# Patient Record
Sex: Male | Born: 1960 | Race: Black or African American | Hispanic: No | Marital: Married | Smoking: Never smoker
Health system: Southern US, Community
[De-identification: ages and names within clinical notes are randomized; demographics above are authoritative.]

## PROBLEM LIST (undated history)

## (undated) DIAGNOSIS — I2699 Other pulmonary embolism without acute cor pulmonale: Secondary | ICD-10-CM

## (undated) DIAGNOSIS — I10 Essential (primary) hypertension: Secondary | ICD-10-CM

## (undated) DIAGNOSIS — U071 COVID-19: Secondary | ICD-10-CM

## (undated) DIAGNOSIS — E119 Type 2 diabetes mellitus without complications: Secondary | ICD-10-CM

## (undated) DIAGNOSIS — I4891 Unspecified atrial fibrillation: Secondary | ICD-10-CM

## (undated) DIAGNOSIS — Z21 Asymptomatic human immunodeficiency virus [HIV] infection status: Secondary | ICD-10-CM

## (undated) DIAGNOSIS — J1282 Pneumonia due to coronavirus disease 2019: Secondary | ICD-10-CM

## (undated) DIAGNOSIS — K265 Chronic or unspecified duodenal ulcer with perforation: Secondary | ICD-10-CM

## (undated) HISTORY — DX: Unspecified atrial fibrillation: I48.91

## (undated) HISTORY — PX: OTHER SURGICAL HISTORY: SHX169

## (undated) HISTORY — DX: Other pulmonary embolism without acute cor pulmonale: I26.99

## (undated) HISTORY — DX: Pneumonia due to coronavirus disease 2019: J12.82

## (undated) HISTORY — DX: COVID-19: U07.1

## (undated) HISTORY — DX: Essential (primary) hypertension: I10

## (undated) HISTORY — DX: Chronic or unspecified duodenal ulcer with perforation: K26.5

---

## 2012-04-16 ENCOUNTER — Emergency Department (HOSPITAL_COMMUNITY): Payer: Medicare Other

## 2012-04-16 ENCOUNTER — Encounter (HOSPITAL_COMMUNITY): Payer: Self-pay | Admitting: *Deleted

## 2012-04-16 ENCOUNTER — Emergency Department (HOSPITAL_COMMUNITY)
Admission: EM | Admit: 2012-04-16 | Discharge: 2012-04-16 | Disposition: A | Discharge status: Home / Self Care | Payer: Medicare Other | Attending: Emergency Medicine | Admitting: Emergency Medicine

## 2012-04-16 DIAGNOSIS — J3489 Other specified disorders of nose and nasal sinuses: Secondary | ICD-10-CM | POA: Insufficient documentation

## 2012-04-16 DIAGNOSIS — R1110 Vomiting, unspecified: Secondary | ICD-10-CM | POA: Insufficient documentation

## 2012-04-16 DIAGNOSIS — R197 Diarrhea, unspecified: Secondary | ICD-10-CM | POA: Insufficient documentation

## 2012-04-16 DIAGNOSIS — Z79899 Other long term (current) drug therapy: Secondary | ICD-10-CM | POA: Insufficient documentation

## 2012-04-16 DIAGNOSIS — J209 Acute bronchitis, unspecified: Secondary | ICD-10-CM | POA: Insufficient documentation

## 2012-04-16 DIAGNOSIS — J029 Acute pharyngitis, unspecified: Secondary | ICD-10-CM | POA: Insufficient documentation

## 2012-04-16 DIAGNOSIS — R062 Wheezing: Secondary | ICD-10-CM | POA: Insufficient documentation

## 2012-04-16 DIAGNOSIS — IMO0001 Reserved for inherently not codable concepts without codable children: Secondary | ICD-10-CM | POA: Insufficient documentation

## 2012-04-16 DIAGNOSIS — Z21 Asymptomatic human immunodeficiency virus [HIV] infection status: Secondary | ICD-10-CM | POA: Insufficient documentation

## 2012-04-16 DIAGNOSIS — I10 Essential (primary) hypertension: Secondary | ICD-10-CM | POA: Insufficient documentation

## 2012-04-16 DIAGNOSIS — R0789 Other chest pain: Secondary | ICD-10-CM | POA: Insufficient documentation

## 2012-04-16 DIAGNOSIS — R509 Fever, unspecified: Secondary | ICD-10-CM | POA: Insufficient documentation

## 2012-04-16 HISTORY — DX: Essential (primary) hypertension: I10

## 2012-04-16 LAB — COMPREHENSIVE METABOLIC PANEL WITH GFR
ALT: 48 U/L (ref 0–53)
AST: 25 U/L (ref 0–37)
Albumin: 3.5 g/dL (ref 3.5–5.2)
Alkaline Phosphatase: 115 U/L (ref 39–117)
BUN: 11 mg/dL (ref 6–23)
CO2: 26 meq/L (ref 19–32)
Calcium: 9 mg/dL (ref 8.4–10.5)
Chloride: 105 meq/L (ref 96–112)
Creatinine, Ser: 0.84 mg/dL (ref 0.50–1.35)
GFR calc Af Amer: >90 mL/min
GFR calc non Af Amer: >90 mL/min
Glucose, Bld: 97 mg/dL (ref 70–99)
Potassium: 3.9 meq/L (ref 3.5–5.1)
Sodium: 140 meq/L (ref 135–145)
Total Bilirubin: 0.2 mg/dL — ABNORMAL LOW (ref 0.3–1.2)
Total Protein: 7 g/dL (ref 6.0–8.3)

## 2012-04-16 LAB — CBC WITH DIFFERENTIAL/PLATELET
Eosinophils Absolute: 0.2 10*3/uL (ref 0.0–0.7)
Eosinophils Relative: 5 % (ref 0–5)
HCT: 42.7 % (ref 39.0–52.0)
Hemoglobin: 14.7 g/dL (ref 13.0–17.0)
Lymphocytes Relative: 40 % (ref 12–46)
Lymphs Abs: 1.9 10*3/uL (ref 0.7–4.0)
MCH: 30.7 pg (ref 26.0–34.0)
MCV: 89.1 fL (ref 78.0–100.0)
Monocytes Absolute: 0.5 10*3/uL (ref 0.1–1.0)
Monocytes Relative: 12 % (ref 3–12)
RBC: 4.79 MIL/uL (ref 4.22–5.81)
WBC: 4.7 10*3/uL (ref 4.0–10.5)

## 2012-04-16 MED ORDER — ALBUTEROL SULFATE HFA 108 (90 BASE) MCG/ACT IN AERS: 1.0000 | INHALATION_SPRAY | Freq: Four times a day (QID) | RESPIRATORY_TRACT | Status: DC | PRN

## 2012-04-16 MED ORDER — PREDNISONE 20 MG PO TABS: ORAL_TABLET | ORAL | Status: DC

## 2012-04-16 MED ORDER — PREDNISONE 50 MG PO TABS
60.0000 mg | ORAL_TABLET | Freq: Once | ORAL | Status: AC
  Administered 2012-04-16: 60 mg via ORAL
  Filled 2012-04-16: qty 1

## 2012-04-16 MED ORDER — ALBUTEROL SULFATE (5 MG/ML) 0.5% IN NEBU
5.0000 mg | INHALATION_SOLUTION | Freq: Once | RESPIRATORY_TRACT | Status: AC
  Administered 2012-04-16: 5 mg via RESPIRATORY_TRACT
  Filled 2012-04-16: qty 1

## 2012-04-16 MED ORDER — AZITHROMYCIN 250 MG PO TABS: ORAL_TABLET | ORAL | Status: DC

## 2012-04-16 MED ORDER — IPRATROPIUM BROMIDE 0.02 % IN SOLN
0.5000 mg | Freq: Once | RESPIRATORY_TRACT | Status: AC
  Administered 2012-04-16: 0.5 mg via RESPIRATORY_TRACT
  Filled 2012-04-16: qty 2.5

## 2012-04-16 NOTE — ED Notes (Signed)
Pt with chills, cough productive but dry now, body aches and fever

## 2012-04-16 NOTE — ED Notes (Signed)
EDP in with patient at this time.  

## 2012-04-16 NOTE — ED Notes (Signed)
Called radiology to check on chest xray.  They to call me back.

## 2012-04-16 NOTE — ED Provider Notes (Signed)
History  This chart was scribed for Ward Givens, MD by Bennett Scrape, ED Scribe. This patient was seen in room APA06/[facility]06 and the patient's care was started at 1:37 PM.  CSN: [id number]  Arrival date & time 04/16/12  1322   First MD Initiated Contact with Patient 04/16/12 1337      Chief Complaint  Patient presents with  . Cough  . Chills     Patient is a 52 y.o. male presenting with cough. The history is provided by the patient. No language interpreter was used.  Cough Cough characteristics:  Productive Sputum characteristics:  Yellow Severity:  Moderate Onset quality:  Gradual Duration:  4 days Timing:  Constant Progression:  Worsening Chronicity:  New Smoker: no   Context: weather changes   Associated symptoms: chills, fever, myalgias, rhinorrhea, sore throat and wheezing   Associated symptoms: no chest pain     Jose Lamb is a 52 y.o. male who presents to the Emergency Department complaining of 4 days of gradual onset, gradually worsening, constant cough productive of yellow mucus with associated wheezing, fever, light yellow rhinorrhea, one episode of diarrhea, one episode of post-tussive emesis and sore throat. He states that he also feels chest tightness across the center with walking and dizziness with lightheadedness with coughing. Pt reports walking in the snow from [employer] location down to [place] area which is several miles" in the sleet and snow the day prior to the symptoms starting. He states that he has been using an inhaler which was prescribed to him for SOB with 30 minutes of improvement. He reports prior episodes of PNA with admissions, last pneumonia diagnosis was in 2000. He reports prior steroid use for similar complaints.   He is currently on medications for HIV (CD4 is 584 and viral load is 0 since 2005) and HTN. He denies smoking and alcohol use.  PCP is Dr. Osker Mason with [facility] with ID  Past Medical History  Diagnosis Date  .  Hypertension   HIV +  Bronchospasm   History reviewed. No pertinent past surgical history.  History reviewed. No pertinent family history.  History  Substance Use Topics  . Smoking status: Never Smoker   . Smokeless tobacco: Not on file  . Alcohol Use: No  on disability     Review of Systems  Constitutional: Positive for fever and chills.  HENT: Positive for sore throat and rhinorrhea.   Respiratory: Positive for cough, chest tightness and wheezing.   Cardiovascular: Negative for chest pain.  Gastrointestinal: Positive for vomiting (post-tussive) and diarrhea. Negative for nausea and abdominal pain.  Musculoskeletal: Positive for myalgias.  All other systems reviewed and are negative.    Allergies  Review of patient's allergies indicates no known allergies.  Home Medications   Current Outpatient Rx  Name  Route  Sig  Dispense  Refill  . albuterol (PROVENTIL HFA;VENTOLIN HFA) 108 (90 BASE) MCG/ACT inhaler   Inhalation   Inhale 2 puffs into the lungs every 6 (six) hours as needed for wheezing or shortness of breath.         . Darunavir Ethanolate (PREZISTA) 800 MG tablet   Oral   Take 800 mg by mouth daily.         [name] emtricitabine-tenofovir (TRUVADA) 200-300 MG per tablet   Oral   Take 1 tablet by mouth daily.         . Etravirine (INTELENCE) 200 MG TABS   Oral   Take 2 tablets by mouth daily.         [name]  gabapentin (NEURONTIN) 300 MG capsule   Oral   Take 300 mg by mouth 3 (three) times daily.         . Pseudoeph-Doxylamine-DM-APAP (NYQUIL) 60-7.07-05-998 MG/30ML LIQD   Oral   Take 30 mLs by mouth at bedtime as needed (cold symptoms).         . ritonavir (NORVIR) 100 MG capsule   Oral   Take 100 mg by mouth daily.         [name] triamterene-hydrochlorothiazide (MAXZIDE-25) 37.5-25 MG per tablet   Oral   Take 1 tablet by mouth daily.         . valACYclovir (VALTREX) 500 MG tablet   Oral   Take 500 mg by mouth 2 (two) times daily.             Triage Vitals: BP 122/86  Pulse 88  Temp(Src) 98.3 F (36.8 C) (Oral)  Resp 20  Ht 5\' 11"  (1.803 m)  Wt 226 lb (102.513 kg)  BMI 31.53 kg/m2  SpO2 95%  Vital signs normal    Physical Exam  Nursing note and vitals reviewed. Constitutional: He is oriented to person, place, and time. He appears well-developed and well-nourished.  Non-toxic appearance. He does not appear ill. No distress.  HENT:  Head: Normocephalic and atraumatic.  Right Ear: External ear normal.  Left Ear: External ear normal.  Nose: Nose normal. No mucosal edema or rhinorrhea.  Mouth/Throat: Oropharynx is clear and moist and mucous membranes are normal. No dental abscesses or edematous.  Eyes: Conjunctivae and EOM are normal. Pupils are equal, round, and reactive to light.  Neck: Normal range of motion and full passive range of motion without pain. Neck supple.  Cardiovascular: Normal rate, regular rhythm and normal heart sounds.  Exam reveals no gallop and no friction rub.   No murmur heard. Pulmonary/Chest: Accessory muscle usage present. No respiratory distress. He has wheezes (diffuse expiratory high pitched wheezing). He has no rhonchi. He has no rales. He exhibits no tenderness and no crepitus.  A lot of coughing noted on exam  Abdominal: Soft. Normal appearance and bowel sounds are normal. He exhibits no distension. There is no tenderness. There is no rebound and no guarding.  Musculoskeletal: Normal range of motion. He exhibits no edema and no tenderness.  Moves all extremities well.   Neurological: He is alert and oriented to person, place, and time. He has normal strength. No cranial nerve deficit.  Skin: Skin is warm, dry and intact. No rash noted. No erythema. No pallor.  Psychiatric: He has a normal mood and affect. His speech is normal and behavior is normal. His mood appears not anxious.    ED Course  Procedures (including critical care time)  Medications  albuterol (PROVENTIL) (5 MG/ML)  0.5% nebulizer solution 5 mg (5 mg Nebulization Given 04/16/12 1442)  ipratropium (ATROVENT) nebulizer solution 0.5 mg (0.5 mg Nebulization Given 04/16/12 1442)  predniSONE (DELTASONE) tablet 60 mg (60 mg Oral Given 04/16/12 1418)     DIAGNOSTIC STUDIES: Oxygen Saturation is 95% on room air, adequate by my interpretation.    COORDINATION OF CARE: 2:08 PM-Discussed treatment plan which includes CXR, CBC panel, and breathing treatment with pt at bedside and pt agreed to plan.   Recheck 16:10 lungs clear after nebulizer, no more coughing feeling better, waiting for CXR to be read.   Pt initially stated he didn't need another inhaler than changed his mind and stated he needed another one.   Results for orders placed during  the hospital encounter of 04/16/12  CBC WITH DIFFERENTIAL      Result Value Range   WBC 4.7  4.0 - 10.5 K/uL   RBC 4.79  4.22 - 5.81 MIL/uL   Hemoglobin 14.7  13.0 - 17.0 g/dL   HCT 40.9  81.1 - 91.4 %   MCV 89.1  78.0 - 100.0 fL   MCH 30.7  26.0 - 34.0 pg   MCHC 34.4  30.0 - 36.0 g/dL   RDW 78.2  95.6 - 21.3 %   Platelets 180  150 - 400 K/uL   Neutrophils Relative 43  43 - 77 %   Neutro Abs 2.0  1.7 - 7.7 K/uL   Lymphocytes Relative 40  12 - 46 %   Lymphs Abs 1.9  0.7 - 4.0 K/uL   Monocytes Relative 12  3 - 12 %   Monocytes Absolute 0.5  0.1 - 1.0 K/uL   Eosinophils Relative 5  0 - 5 %   Eosinophils Absolute 0.2  0.0 - 0.7 K/uL   Basophils Relative 1  0 - 1 %   Basophils Absolute 0.0  0.0 - 0.1 K/uL  COMPREHENSIVE METABOLIC PANEL      Result Value Range   Sodium 140  135 - 145 mEq/L   Potassium 3.9  3.5 - 5.1 mEq/L   Chloride 105  96 - 112 mEq/L   CO2 26  19 - 32 mEq/L   Glucose, Bld 97  70 - 99 mg/dL   BUN 11  6 - 23 mg/dL   Creatinine, Ser 0.86  0.50 - 1.35 mg/dL   Calcium 9.0  8.4 - 57.8 mg/dL   Total Protein 7.0  6.0 - 8.3 g/dL   Albumin 3.5  3.5 - 5.2 g/dL   AST 25  0 - 37 U/L   ALT 48  0 - 53 U/L   Alkaline Phosphatase 115  39 - 117 U/L   Total  Bilirubin 0.2 (*) 0.3 - 1.2 mg/dL   GFR calc non Af Amer >90  >90 mL/min   GFR calc Af Amer >90  >90 mL/min   Laboratory interpretation all normal    Dg Chest 2 View  04/16/2012  *RADIOLOGY REPORT*  Clinical Data: Cough and chills  CHEST - 2 VIEW  Comparison: None  Findings: Mild bronchitic changes are noted bilaterally.  No focal airspace consolidation or atelectasis.  No pleural effusion or edema.  Lung volumes are slightly decreased.  The heart size is normal.  IMPRESSION:  1.  No evidence for pneumonia. 2.  Bronchitic changes.   Original Report Authenticated By: Signa Kell, M.D.      1. Bronchitis, acute, with bronchospasm     New Prescriptions   ALBUTEROL (PROVENTIL HFA;VENTOLIN HFA) 108 (90 BASE) MCG/ACT INHALER    Inhale 1-2 puffs into the lungs every 6 (six) hours as needed for wheezing.   AZITHROMYCIN (ZITHROMAX Z-PAK) 250 MG TABLET    Take 2 po the first day then once a day for the next 4 days.   PREDNISONE (DELTASONE) 20 MG TABLET    Take 3 po QD x 2d starting tomorrow, then 2 po QD x 3d then 1 po QD x 3d    Plan discharge  Devoria Albe, MD, FACEP   MDM   I personally performed the services described in this documentation, which was scribed in my presence. The recorded information has been reviewed and considered.  Devoria Albe, MD, FACEP    Ward Givens, MD  04/16/12 1713 

## 2012-07-04 DIAGNOSIS — E66811 Obesity, class 1: Secondary | ICD-10-CM | POA: Insufficient documentation

## 2012-07-04 DIAGNOSIS — E669 Obesity, unspecified: Secondary | ICD-10-CM | POA: Insufficient documentation

## 2012-12-15 ENCOUNTER — Emergency Department (HOSPITAL_COMMUNITY): Payer: Medicare Other

## 2012-12-15 ENCOUNTER — Encounter (HOSPITAL_COMMUNITY): Payer: Self-pay | Admitting: Emergency Medicine

## 2012-12-15 ENCOUNTER — Emergency Department (HOSPITAL_COMMUNITY)
Admission: EM | Admit: 2012-12-15 | Discharge: 2012-12-15 | Disposition: A | Discharge status: Home / Self Care | Payer: Medicare Other | Attending: Emergency Medicine | Admitting: Emergency Medicine

## 2012-12-15 DIAGNOSIS — Z21 Asymptomatic human immunodeficiency virus [HIV] infection status: Secondary | ICD-10-CM | POA: Insufficient documentation

## 2012-12-15 DIAGNOSIS — Z79899 Other long term (current) drug therapy: Secondary | ICD-10-CM | POA: Insufficient documentation

## 2012-12-15 DIAGNOSIS — J45901 Unspecified asthma with (acute) exacerbation: Secondary | ICD-10-CM

## 2012-12-15 DIAGNOSIS — J40 Bronchitis, not specified as acute or chronic: Secondary | ICD-10-CM

## 2012-12-15 DIAGNOSIS — I10 Essential (primary) hypertension: Secondary | ICD-10-CM | POA: Insufficient documentation

## 2012-12-15 MED ORDER — ALBUTEROL SULFATE (5 MG/ML) 0.5% IN NEBU
5.0000 mg | INHALATION_SOLUTION | Freq: Once | RESPIRATORY_TRACT | Status: DC
  Filled 2012-12-15: qty 1

## 2012-12-15 MED ORDER — ALBUTEROL SULFATE (5 MG/ML) 0.5% IN NEBU
5.0000 mg | INHALATION_SOLUTION | Freq: Once | RESPIRATORY_TRACT | Status: AC
  Administered 2012-12-15: 5 mg via RESPIRATORY_TRACT
  Filled 2012-12-15: qty 1

## 2012-12-15 MED ORDER — AMOXICILLIN 500 MG PO CAPS: 500.0000 mg | ORAL_CAPSULE | Freq: Three times a day (TID) | ORAL | Status: DC

## 2012-12-15 MED ORDER — DM-GUAIFENESIN ER 30-600 MG PO TB12
1.0000 | ORAL_TABLET | Freq: Two times a day (BID) | ORAL | Status: DC
  Administered 2012-12-15: 1 via ORAL
  Filled 2012-12-15: qty 1

## 2012-12-15 MED ORDER — PREDNISONE 20 MG PO TABS: ORAL_TABLET | ORAL | Status: DC

## 2012-12-15 MED ORDER — IPRATROPIUM BROMIDE 0.02 % IN SOLN
0.5000 mg | Freq: Once | RESPIRATORY_TRACT | Status: DC
  Filled 2012-12-15: qty 2.5

## 2012-12-15 MED ORDER — ALBUTEROL (5 MG/ML) CONTINUOUS INHALATION SOLN
15.0000 mg/h | INHALATION_SOLUTION | Freq: Once | RESPIRATORY_TRACT | Status: AC
  Administered 2012-12-15: 15 mg/h via RESPIRATORY_TRACT
  Filled 2012-12-15: qty 20

## 2012-12-15 MED ORDER — IPRATROPIUM BROMIDE 0.02 % IN SOLN
0.5000 mg | Freq: Once | RESPIRATORY_TRACT | Status: AC
  Administered 2012-12-15: 0.5 mg via RESPIRATORY_TRACT
  Filled 2012-12-15: qty 2.5

## 2012-12-15 MED ORDER — PREDNISONE 50 MG PO TABS
60.0000 mg | ORAL_TABLET | Freq: Once | ORAL | Status: AC
  Administered 2012-12-15: 60 mg via ORAL
  Filled 2012-12-15 (×2): qty 1

## 2012-12-15 MED ORDER — DM-GUAIFENESIN ER 30-600 MG PO TB12: 1.0000 | ORAL_TABLET | Freq: Two times a day (BID) | ORAL | Status: DC

## 2012-12-15 NOTE — ED Notes (Signed)
Pt c/o cough with yellow mucous production, chest tightness with coughing, chills, voice hoarse at times, states that the symptoms started two days ago,

## 2012-12-15 NOTE — ED Provider Notes (Signed)
CSN: [id number]     Arrival date & time 12/15/12  1632 History   First MD Initiated Contact with Patient 12/15/12 1732     Chief Complaint  Patient presents with  . Cough   (Consider location/radiation/quality/duration/timing/severity/associated sxs/prior Treatment) HPI Patient reports for the past 3 days he's had a cough with wheezing. He states he feels short of breath and he has posttussive vomiting. He also states he's coughing up yellow sputum. He is unaware of any fever but states sometimes he has a sore throat and he has rhinorrhea that is also yellow. He has been sneezing. He states today he used his albuterol inhaler and it did not help. He states he has a history of asthma and has only 4 prior asthmatic episodes in the past.  Patient is followed at [facility] for his HIV disease. He reports his viral load has been 0 for the past 5 years.  ID physician [facility]  Past Medical History  Diagnosis Date  . Hypertension   Asthma HIV +   History reviewed. No pertinent past surgical history. No family history on file. History  Substance Use Topics  . Smoking status: Never Smoker   . Smokeless tobacco: Not on file  . Alcohol Use: No  lives at home Lives with spouse On disability since 1992 for HIV +  Review of Systems  All other systems reviewed and are negative.    Allergies  Asa  Home Medications   Current Outpatient Rx  Name  Route  Sig  Dispense  Refill  . albuterol (PROVENTIL HFA;VENTOLIN HFA) 108 (90 BASE) MCG/ACT inhaler   Inhalation   Inhale 2 puffs into the lungs every 6 (six) hours as needed for wheezing or shortness of breath.         . Darunavir Ethanolate (PREZISTA) 800 MG tablet   Oral   Take 800 mg by mouth daily.         [name] emtricitabine-tenofovir (TRUVADA) 200-300 MG per tablet   Oral   Take 1 tablet by mouth daily.         . Etravirine (INTELENCE) 200 MG TABS   Oral   Take 2 tablets by mouth daily.         [name] gabapentin (NEURONTIN) 300 MG  capsule   Oral   Take 300 mg by mouth 3 (three) times daily.         . ritonavir (NORVIR) 100 MG capsule   Oral   Take 100 mg by mouth daily.         [name] triamterene-hydrochlorothiazide (MAXZIDE-25) 37.5-25 MG per tablet   Oral   Take 1 tablet by mouth daily.         . valACYclovir (VALTREX) 500 MG tablet   Oral   Take 500 mg by mouth 2 (two) times daily.           BP 129/79  Pulse 113  Temp(Src) 98.4 F (36.9 C) (Oral)  Resp 20  Ht 5\' 11"  (1.803 m)  Wt 234 lb (106.142 kg)  BMI 32.65 kg/m2  SpO2 99%  Vital signs normal except tachycardia  Physical Exam  Nursing note and vitals reviewed. Constitutional: He is oriented to person, place, and time. He appears well-developed and well-nourished.  Non-toxic appearance. He does not appear ill. No distress.  HENT:  Head: Normocephalic and atraumatic.  Right Ear: External ear normal.  Left Ear: External ear normal.  Nose: Nose normal. No mucosal edema or rhinorrhea.  Mouth/Throat: Oropharynx is clear and  moist and mucous membranes are normal. No dental abscesses or uvula swelling.  Eyes: Conjunctivae and EOM are normal. Pupils are equal, round, and reactive to light.  Neck: Normal range of motion and full passive range of motion without pain. Neck supple.  Cardiovascular: Normal rate, regular rhythm and normal heart sounds.  Exam reveals no gallop and no friction rub.   No murmur heard. Pulmonary/Chest: Effort normal. No respiratory distress. He has decreased breath sounds. He has no wheezes. He has rhonchi. He has no rales. He exhibits no tenderness and no crepitus.  Coughing frequently, sounds tight  Abdominal: Soft. Normal appearance and bowel sounds are normal. He exhibits no distension. There is no tenderness. There is no rebound and no guarding.  Musculoskeletal: Normal range of motion. He exhibits no edema and no tenderness.  Moves all extremities well.   Neurological: He is alert and oriented to person, place, and  time. He has normal strength. No cranial nerve deficit.  Skin: Skin is warm, dry and intact. No rash noted. No erythema. No pallor.  Psychiatric: He has a normal mood and affect. His speech is normal and behavior is normal. His mood appears not anxious.    ED Course  Procedures (including critical care time)  Medications  dextromethorphan-guaiFENesin (MUCINEX DM) 30-600 MG per 12 hr tablet 1 tablet (1 tablet Oral Given 12/15/12 2123)  albuterol (PROVENTIL,VENTOLIN) solution continuous neb (15 mg/hr Nebulization Given 12/15/12 1834)  ipratropium (ATROVENT) nebulizer solution 0.5 mg (0.5 mg Nebulization Given 12/15/12 1834)  predniSONE (DELTASONE) tablet 60 mg (60 mg Oral Given 12/15/12 1842)  albuterol (PROVENTIL) (5 MG/ML) 0.5% nebulizer solution 5 mg (5 mg Nebulization Given 12/15/12 2029)  ipratropium (ATROVENT) nebulizer solution 0.5 mg (0.5 mg Nebulization Given 12/15/12 2029)   19:55 Recheck after continuous nebulizer, pt feeling better, has improved air movement, still has some cough. Feels he needs one more nebulizer.  Recheck after single nebulizer, he feels ready to go home. Has continued improved air movement. Ready to go home. States he has plenty of inhalers at home.    Labs Review Labs Reviewed - No data to display Imaging Review Dg Chest 2 View  12/15/2012   CLINICAL DATA:  Cough, hoarseness  EXAM: CHEST  2 VIEW  COMPARISON:  04/16/2012  FINDINGS: Cardiomediastinal silhouette is stable. No acute infiltrate or pleural effusion. No pulmonary edema. Mild degenerative changes thoracic spine.  IMPRESSION: No active cardiopulmonary disease.   Electronically Signed   By: Natasha Mead M.D.   On: 12/15/2012 17:13    EKG Interpretation   None       MDM   1. Bronchitis   2. Asthma exacerbation    Discharge Medication List as of 12/15/2012  8:54 PM    START taking these medications   Details  amoxicillin (AMOXIL) 500 MG capsule Take 1 capsule (500 mg total) by mouth 3 (three)  times daily., Starting 12/15/2012, Until Discontinued, Print    dextromethorphan-guaiFENesin (MUCINEX DM) 30-600 MG per 12 hr tablet Take 1 tablet by mouth 2 (two) times daily., Starting 12/15/2012, Until Discontinued, Print    predniSONE (DELTASONE) 20 MG tablet Take 3 po QD x 2d starting tomorrow, then 2 po QD x 3d then 1 po QD x 3d, Print        Plan discharge  Devoria Albe, MD, Franz Dell, MD 12/15/12 2226

## 2013-02-06 DIAGNOSIS — K265 Chronic or unspecified duodenal ulcer with perforation: Secondary | ICD-10-CM

## 2013-02-06 HISTORY — DX: Chronic or unspecified duodenal ulcer with perforation: K26.5

## 2013-03-13 DIAGNOSIS — M792 Neuralgia and neuritis, unspecified: Secondary | ICD-10-CM | POA: Insufficient documentation

## 2013-03-18 ENCOUNTER — Emergency Department (HOSPITAL_COMMUNITY): Payer: Medicare Other

## 2013-03-18 ENCOUNTER — Encounter (HOSPITAL_COMMUNITY): Payer: Self-pay | Admitting: Emergency Medicine

## 2013-03-18 ENCOUNTER — Emergency Department (HOSPITAL_COMMUNITY)
Admission: EM | Admit: 2013-03-18 | Discharge: 2013-03-18 | Disposition: A | Discharge status: Home / Self Care | Payer: Medicare Other | Attending: Emergency Medicine | Admitting: Emergency Medicine

## 2013-03-18 DIAGNOSIS — J45901 Unspecified asthma with (acute) exacerbation: Secondary | ICD-10-CM | POA: Insufficient documentation

## 2013-03-18 DIAGNOSIS — J069 Acute upper respiratory infection, unspecified: Secondary | ICD-10-CM

## 2013-03-18 DIAGNOSIS — Z792 Long term (current) use of antibiotics: Secondary | ICD-10-CM | POA: Insufficient documentation

## 2013-03-18 DIAGNOSIS — Z79899 Other long term (current) drug therapy: Secondary | ICD-10-CM | POA: Insufficient documentation

## 2013-03-18 DIAGNOSIS — I10 Essential (primary) hypertension: Secondary | ICD-10-CM | POA: Insufficient documentation

## 2013-03-18 DIAGNOSIS — J45909 Unspecified asthma, uncomplicated: Secondary | ICD-10-CM

## 2013-03-18 MED ORDER — IPRATROPIUM-ALBUTEROL 0.5-2.5 (3) MG/3ML IN SOLN
RESPIRATORY_TRACT | Status: AC
  Administered 2013-03-18: 3 mL via RESPIRATORY_TRACT
  Filled 2013-03-18: qty 3

## 2013-03-18 MED ORDER — ALBUTEROL SULFATE (2.5 MG/3ML) 0.083% IN NEBU
2.5000 mg | INHALATION_SOLUTION | Freq: Once | RESPIRATORY_TRACT | Status: AC
Start: 2013-03-18 — End: 2013-03-18
  Administered 2013-03-18: 2.5 mg via RESPIRATORY_TRACT
  Filled 2013-03-18: qty 3

## 2013-03-18 MED ORDER — ALBUTEROL SULFATE (2.5 MG/3ML) 0.083% IN NEBU: 5.0000 mg | INHALATION_SOLUTION | Freq: Once | RESPIRATORY_TRACT | Status: DC

## 2013-03-18 MED ORDER — HYDROCOD POLST-CHLORPHEN POLST 10-8 MG/5ML PO LQCR
5.0000 mL | Freq: Once | ORAL | Status: AC
  Administered 2013-03-18: 5 mL via ORAL
  Filled 2013-03-18: qty 5

## 2013-03-18 MED ORDER — PREDNISONE 50 MG PO TABS
60.0000 mg | ORAL_TABLET | Freq: Once | ORAL | Status: AC
  Administered 2013-03-18: 60 mg via ORAL
  Filled 2013-03-18 (×2): qty 1

## 2013-03-18 MED ORDER — IPRATROPIUM BROMIDE 0.02 % IN SOLN: 0.5000 mg | Freq: Once | RESPIRATORY_TRACT | Status: DC

## 2013-03-18 MED ORDER — HYDROCOD POLST-CHLORPHEN POLST 10-8 MG/5ML PO LQCR: 5.0000 mL | Freq: Two times a day (BID) | ORAL | Status: DC | PRN

## 2013-03-18 MED ORDER — IPRATROPIUM-ALBUTEROL 0.5-2.5 (3) MG/3ML IN SOLN: 3.0000 mL | RESPIRATORY_TRACT | Status: DC

## 2013-03-18 MED ORDER — ACETAMINOPHEN 325 MG PO TABS
650.0000 mg | ORAL_TABLET | Freq: Once | ORAL | Status: AC
  Administered 2013-03-18: 650 mg via ORAL
  Filled 2013-03-18: qty 2

## 2013-03-18 MED ORDER — PREDNISONE 10 MG PO TABS: ORAL_TABLET | ORAL | Status: DC

## 2013-03-18 NOTE — ED Notes (Signed)
Pt has had flu like symptoms since sat, cough, congestion, runny nose fever.

## 2013-03-18 NOTE — Discharge Instructions (Signed)
Please continue your albuterol inhalations. Please add prednisone daily. May use Tussionex for cough and congestion. Tussionex may cause drowsiness, please use with caution. Please see your primary physician, or return to the emergency department if not improving. Upper Respiratory Infection, Adult An upper respiratory infection (URI) is also sometimes known as the common cold. The upper respiratory tract includes the nose, sinuses, throat, trachea, and bronchi. Bronchi are the airways leading to the lungs. Most people improve within 1 week, but symptoms can last up to 2 weeks. A residual cough may last even longer.  CAUSES Many different viruses can infect the tissues lining the upper respiratory tract. The tissues become irritated and inflamed and often become very moist. Mucus production is also common. A cold is contagious. You can easily spread the virus to others by oral contact. This includes kissing, sharing a glass, coughing, or sneezing. Touching your mouth or nose and then touching a surface, which is then touched by another person, can also spread the virus. SYMPTOMS  Symptoms typically develop 1 to 3 days after you come in contact with a cold virus. Symptoms vary from person to person. They may include:  Runny nose.  Sneezing.  Nasal congestion.  Sinus irritation.  Sore throat.  Loss of voice (laryngitis).  Cough.  Fatigue.  Muscle aches.  Loss of appetite.  Headache.  Low-grade fever. DIAGNOSIS  You might diagnose your own cold based on familiar symptoms, since most people get a cold 2 to 3 times a year. Your caregiver can confirm this based on your exam. Most importantly, your caregiver can check that your symptoms are not due to another disease such as strep throat, sinusitis, pneumonia, asthma, or epiglottitis. Blood tests, throat tests, and X-rays are not necessary to diagnose a common cold, but they may sometimes be helpful in excluding other more serious diseases.  Your caregiver will decide if any further tests are required. RISKS AND COMPLICATIONS  You may be at risk for a more severe case of the common cold if you smoke cigarettes, have chronic heart disease (such as heart failure) or lung disease (such as asthma), or if you have a weakened immune system. The very young and very old are also at risk for more serious infections. Bacterial sinusitis, middle ear infections, and bacterial pneumonia can complicate the common cold. The common cold can worsen asthma and chronic obstructive pulmonary disease (COPD). Sometimes, these complications can require emergency medical care and may be life-threatening. PREVENTION  The best way to protect against getting a cold is to practice good hygiene. Avoid oral or hand contact with people with cold symptoms. Wash your hands often if contact occurs. There is no clear evidence that vitamin C, vitamin E, echinacea, or exercise reduces the chance of developing a cold. However, it is always recommended to get plenty of rest and practice good nutrition. TREATMENT  Treatment is directed at relieving symptoms. There is no cure. Antibiotics are not effective, because the infection is caused by a virus, not by bacteria. Treatment may include:  Increased fluid intake. Sports drinks offer valuable electrolytes, sugars, and fluids.  Breathing heated mist or steam (vaporizer or shower).  Eating chicken soup or other clear broths, and maintaining good nutrition.  Getting plenty of rest.  Using gargles or lozenges for comfort.  Controlling fevers with ibuprofen or acetaminophen as directed by your caregiver.  Increasing usage of your inhaler if you have asthma. Zinc gel and zinc lozenges, taken in the first 24 hours of the  common cold, can shorten the duration and lessen the severity of symptoms. Pain medicines may help with fever, muscle aches, and throat pain. A variety of non-prescription medicines are available to treat  congestion and runny nose. Your caregiver can make recommendations and may suggest nasal or lung inhalers for other symptoms.  HOME CARE INSTRUCTIONS   Only take over-the-counter or prescription medicines for pain, discomfort, or fever as directed by your caregiver.  Use a warm mist humidifier or inhale steam from a shower to increase air moisture. This may keep secretions moist and make it easier to breathe.  Drink enough water and fluids to keep your urine clear or pale yellow.  Rest as needed.  Return to work when your temperature has returned to normal or as your caregiver advises. You may need to stay home longer to avoid infecting others. You can also use a face mask and careful hand washing to prevent spread of the virus. SEEK MEDICAL CARE IF:   After the first few days, you feel you are getting worse rather than better.  You need your caregiver's advice about medicines to control symptoms.  You develop chills, worsening shortness of breath, or brown or red sputum. These may be signs of pneumonia.  You develop yellow or brown nasal discharge or pain in the face, especially when you bend forward. These may be signs of sinusitis.  You develop a fever, swollen neck glands, pain with swallowing, or white areas in the back of your throat. These may be signs of strep throat. SEEK IMMEDIATE MEDICAL CARE IF:   You have a fever.  You develop severe or persistent headache, ear pain, sinus pain, or chest pain.  You develop wheezing, a prolonged cough, cough up blood, or have a change in your usual mucus (if you have chronic lung disease).  You develop sore muscles or a stiff neck. Document Released: 07/19/2000 Document Revised: 04/17/2011 Document Reviewed: 05/27/2010 [facility] Patient Information 2014 [place], [place].

## 2013-03-18 NOTE — ED Provider Notes (Signed)
Medical screening examination/treatment/procedure(s) were performed by non-physician practitioner and as supervising physician I was immediately available for consultation/collaboration.  EKG Interpretation   None         Orpah Greek, MD 03/18/13 [phone]

## 2013-03-18 NOTE — ED Notes (Signed)
Patient ambulated in hall.  Patient's o2 sat was 97% - after walking patient's o2 sat was 92-93%.  Patient did complain of dizziness while walking but he did not have any trouble ambulating in the hall.

## 2013-03-18 NOTE — ED Provider Notes (Signed)
CSN: [id number]     Arrival date & time 03/18/13  1308 History   First MD Initiated Contact with Patient 03/18/13 [phone]     Chief Complaint  Patient presents with  . Influenza     (Consider location/radiation/quality/duration/timing/severity/associated sxs/prior Treatment) Patient is a 53 y.o. male presenting with flu symptoms. The history is provided by the patient.  Influenza Presenting symptoms: cough, fatigue, fever, headache, myalgias and shortness of breath   Severity:  Moderate Onset quality:  Gradual Duration:  3 days Progression:  Worsening Chronicity:  New Relieved by:  Nothing Worsened by:  Nothing tried Associated symptoms: chills, decreased physical activity and nasal congestion   Risk factors: sick contacts   Risk factors: no diabetes problem     Past Medical History  Diagnosis Date  . Hypertension    History reviewed. No pertinent past surgical history. History reviewed. No pertinent family history. History  Substance Use Topics  . Smoking status: Never Smoker   . Smokeless tobacco: Not on file  . Alcohol Use: No    Review of Systems  Constitutional: Positive for fever, chills and fatigue. Negative for activity change.       All ROS Neg except as noted in HPI  HENT: Positive for congestion. Negative for nosebleeds.   Eyes: Negative for photophobia and discharge.  Respiratory: Positive for cough and shortness of breath. Negative for wheezing.   Cardiovascular: Negative for chest pain and palpitations.  Gastrointestinal: Negative for abdominal pain and blood in stool.  Genitourinary: Negative for dysuria, frequency and hematuria.  Musculoskeletal: Positive for myalgias. Negative for arthralgias, back pain and neck pain.  Skin: Negative.   Neurological: Positive for headaches. Negative for dizziness, seizures and speech difficulty.  Psychiatric/Behavioral: Negative for hallucinations and confusion.      Allergies  Asa  Home Medications   Current  Outpatient Rx  Name  Route  Sig  Dispense  Refill  . albuterol (PROVENTIL HFA;VENTOLIN HFA) 108 (90 BASE) MCG/ACT inhaler   Inhalation   Inhale 2 puffs into the lungs every 6 (six) hours as needed for wheezing or shortness of breath.         [name] amoxicillin (AMOXIL) 500 MG capsule   Oral   Take 1 capsule (500 mg total) by mouth 3 (three) times daily.   21 capsule   0   . Darunavir Ethanolate (PREZISTA) 800 MG tablet   Oral   Take 800 mg by mouth daily.         [name] dextromethorphan-guaiFENesin (MUCINEX DM) 30-600 MG per 12 hr tablet   Oral   Take 1 tablet by mouth 2 (two) times daily.   20 tablet   0   . emtricitabine-tenofovir (TRUVADA) 200-300 MG per tablet   Oral   Take 1 tablet by mouth daily.         . Etravirine (INTELENCE) 200 MG TABS   Oral   Take 2 tablets by mouth daily.         [name] gabapentin (NEURONTIN) 300 MG capsule   Oral   Take 300 mg by mouth 3 (three) times daily.         . predniSONE (DELTASONE) 20 MG tablet      Take 3 po QD x 2d starting tomorrow, then 2 po QD x 3d then 1 po QD x 3d   15 tablet   0   . ritonavir (NORVIR) 100 MG capsule   Oral   Take 100 mg by mouth daily.         [name]  triamterene-hydrochlorothiazide (MAXZIDE-25) 37.5-25 MG per tablet   Oral   Take 1 tablet by mouth daily.         . valACYclovir (VALTREX) 500 MG tablet   Oral   Take 500 mg by mouth 2 (two) times daily.           BP 148/74  Pulse 104  Temp(Src) 100.4 F (38 C) (Oral)  Resp 23  Ht 5\' 11"  (1.803 m)  Wt 257 lb (116.574 kg)  BMI 35.86 kg/m2  SpO2 94% Physical Exam  Nursing note and vitals reviewed. Constitutional: He is oriented to person, place, and time. He appears well-developed and well-nourished.  Non-toxic appearance.  HENT:  Head: Normocephalic.  Right Ear: Tympanic membrane and external ear normal.  Left Ear: Tympanic membrane and external ear normal.  Eyes: EOM and lids are normal. Pupils are equal, round, and reactive to light.   Neck: Normal range of motion. Neck supple. Carotid bruit is not present.  Cardiovascular: Normal rate, regular rhythm, normal heart sounds, intact distal pulses and normal pulses.   Pulmonary/Chest: No respiratory distress. He has wheezes. He has rhonchi.  Abdominal: Soft. Bowel sounds are normal. There is no tenderness. There is no guarding.  Musculoskeletal: Normal range of motion.  Lymphadenopathy:       Head (right side): No submandibular adenopathy present.       Head (left side): No submandibular adenopathy present.    He has no cervical adenopathy.  Neurological: He is alert and oriented to person, place, and time. He has normal strength. No cranial nerve deficit or sensory deficit.  Skin: Skin is warm and dry.  Psychiatric: He has a normal mood and affect. His speech is normal.    ED Course  Procedures (including critical care time) Labs Review Labs Reviewed - No data to display Imaging Review No results found.  EKG Interpretation   None       MDM Patient has history of asthma. He has a recent history of upper respiratory type symptoms. Patient has had problems with cough congestion and fever. He denies any hemoptysis. He is not sure of his temperatures at home.    Final diagnoses:  None    *I have reviewed nursing notes, vital signs, and all appropriate lab and imaging results for this patient.** The chest x-ray is negative for pneumonia or acute event. Pulse oximetry in the emergency department has ranged from 92-97% oral air. Within normal limits by my interpretation. Patient breathing easier after nebulizer treatments.  Plan patient has an inhaler is at home. We'll add prednisone, and Tussionex to his current medications. Patient to follow up with his primary physician if not improving.  Lenox Ahr, [name] 03/18/13 1132

## 2013-07-04 ENCOUNTER — Encounter (HOSPITAL_COMMUNITY): Payer: Self-pay | Admitting: Emergency Medicine

## 2013-07-04 ENCOUNTER — Emergency Department (HOSPITAL_COMMUNITY): Payer: Medicare Other

## 2013-07-04 ENCOUNTER — Emergency Department (HOSPITAL_COMMUNITY)
Admission: EM | Admit: 2013-07-04 | Discharge: 2013-07-04 | Disposition: A | Discharge status: Home / Self Care | Payer: Medicare Other | Attending: Emergency Medicine | Admitting: Emergency Medicine

## 2013-07-04 DIAGNOSIS — R059 Cough, unspecified: Secondary | ICD-10-CM | POA: Insufficient documentation

## 2013-07-04 DIAGNOSIS — J3489 Other specified disorders of nose and nasal sinuses: Secondary | ICD-10-CM | POA: Insufficient documentation

## 2013-07-04 DIAGNOSIS — Z792 Long term (current) use of antibiotics: Secondary | ICD-10-CM | POA: Insufficient documentation

## 2013-07-04 DIAGNOSIS — R05 Cough: Secondary | ICD-10-CM

## 2013-07-04 DIAGNOSIS — IMO0002 Reserved for concepts with insufficient information to code with codable children: Secondary | ICD-10-CM | POA: Insufficient documentation

## 2013-07-04 DIAGNOSIS — R062 Wheezing: Secondary | ICD-10-CM | POA: Insufficient documentation

## 2013-07-04 DIAGNOSIS — Z79899 Other long term (current) drug therapy: Secondary | ICD-10-CM | POA: Insufficient documentation

## 2013-07-04 DIAGNOSIS — I10 Essential (primary) hypertension: Secondary | ICD-10-CM | POA: Insufficient documentation

## 2013-07-04 DIAGNOSIS — R21 Rash and other nonspecific skin eruption: Secondary | ICD-10-CM | POA: Insufficient documentation

## 2013-07-04 MED ORDER — AZITHROMYCIN 250 MG PO TABS
ORAL_TABLET | ORAL | Status: DC
Start: 2013-07-04 — End: 2013-10-25

## 2013-07-04 MED ORDER — ALBUTEROL SULFATE HFA 108 (90 BASE) MCG/ACT IN AERS
2.0000 | INHALATION_SPRAY | Freq: Once | RESPIRATORY_TRACT | Status: AC
  Administered 2013-07-04: 2 via RESPIRATORY_TRACT
  Filled 2013-07-04: qty 6.7

## 2013-07-04 MED ORDER — HYDROCOD POLST-CHLORPHEN POLST 10-8 MG/5ML PO LQCR: 5.0000 mL | Freq: Two times a day (BID) | ORAL | Status: DC

## 2013-07-04 MED ORDER — BENZONATATE 100 MG PO CAPS
200.0000 mg | ORAL_CAPSULE | Freq: Once | ORAL | Status: AC
  Administered 2013-07-04: 200 mg via ORAL
  Filled 2013-07-04: qty 2

## 2013-07-04 MED ORDER — PREDNISONE 20 MG PO TABS: ORAL_TABLET | ORAL | Status: DC

## 2013-07-04 MED ORDER — ALBUTEROL SULFATE (2.5 MG/3ML) 0.083% IN NEBU
2.5000 mg | INHALATION_SOLUTION | Freq: Once | RESPIRATORY_TRACT | Status: AC
  Administered 2013-07-04: 2.5 mg via RESPIRATORY_TRACT
  Filled 2013-07-04: qty 3

## 2013-07-04 MED ORDER — HYDROCOD POLST-CHLORPHEN POLST 10-8 MG/5ML PO LQCR
5.0000 mL | Freq: Once | ORAL | Status: AC
  Administered 2013-07-04: 5 mL via ORAL
  Filled 2013-07-04: qty 5

## 2013-07-04 MED ORDER — IPRATROPIUM-ALBUTEROL 0.5-2.5 (3) MG/3ML IN SOLN
3.0000 mL | Freq: Once | RESPIRATORY_TRACT | Status: AC
  Administered 2013-07-04: 3 mL via RESPIRATORY_TRACT
  Filled 2013-07-04: qty 3

## 2013-07-04 NOTE — ED Notes (Signed)
Pt c/o generalized rash and itching, onset this am.

## 2013-07-04 NOTE — Discharge Instructions (Signed)

## 2013-07-04 NOTE — ED Notes (Signed)
Pt c/o cough, onset Sun. Pt reports emesis on Mon. All day with fever. Active emesis this am.

## 2013-07-06 NOTE — ED Provider Notes (Signed)
CSN: [id number]     Arrival date & time 07/04/13  1031 History   First MD Initiated Contact with Patient 07/04/13 1134     Chief Complaint  Patient presents with  . Allergic Reaction     (Consider location/radiation/quality/duration/timing/severity/associated sxs/prior Treatment) Patient is a 53 y.o. male presenting with allergic reaction. The history is provided by the patient.  Allergic Reaction Presenting symptoms: itching and rash   Presenting symptoms: no difficulty breathing, no difficulty swallowing, no swelling and no wheezing   Itching:    Location:  Full body   Severity:  Mild   Onset quality:  Sudden (onset suddenly after excessive coughing)   Timing:  Intermittent   Progression:  Resolved Severity:  Mild Prior allergic episodes:  No prior episodes Context comment:  Unknown exposure to new foods, medications or chemicals Relieved by:  Antihistamines Worsened by:  Nothing tried Ineffective treatments:  None tried  Pt reports intermittent, persistent cough and nasal congestion for several days.  States cough is non-productive and "feels like a tickle in my throat".  He denies fever, shortness of breath, difficulty swallowing, breathing or facial swelling, no chest pain.  States that he began coughing excessively this morning and suddenly developed itching all over and noticed "red bumps" to his torso and upper extremities.  Wife gave him benadryl tablet and he states the rash resolved prior to ED arrival.    Past Medical History  Diagnosis Date  . Hypertension    History reviewed. No pertinent past surgical history. History reviewed. No pertinent family history. History  Substance Use Topics  . Smoking status: Never Smoker   . Smokeless tobacco: Not on file  . Alcohol Use: No    Review of Systems  Constitutional: Negative for fever, chills, activity change and appetite change.  HENT: Positive for congestion. Negative for ear pain, facial swelling, sinus pressure,  sore throat and trouble swallowing.   Respiratory: Positive for cough. Negative for chest tightness, shortness of breath and wheezing.   Cardiovascular: Negative for chest pain.  Gastrointestinal: Negative for vomiting and abdominal pain.  Musculoskeletal: Negative for neck pain and neck stiffness.  Skin: Positive for itching and rash. Negative for wound.  Neurological: Negative for dizziness, syncope, weakness, numbness and headaches.  All other systems reviewed and are negative.     Allergies  Asa  Home Medications   Prior to Admission medications   Medication Sig Start Date End Date Taking? Authorizing Provider  albuterol (PROVENTIL HFA;VENTOLIN HFA) 108 (90 BASE) MCG/ACT inhaler Inhale 2 puffs into the lungs every 6 (six) hours as needed for wheezing or shortness of breath.   Yes Historical Provider, MD  Darunavir Ethanolate (PREZISTA) 800 MG tablet Take 800 mg by mouth at bedtime.    Yes Historical Provider, MD  dextromethorphan-guaiFENesin (TUSSIN COUGH DM SUGAR FREE) 10-100 MG/5ML liquid Take 10 mLs by mouth every 4 (four) hours as needed for cough.   Yes Historical Provider, MD  emtricitabine-tenofovir (TRUVADA) 200-300 MG per tablet Take 1 tablet by mouth at bedtime.    Yes Historical Provider, MD  Etravirine (INTELENCE) 200 MG TABS Take 2 tablets by mouth at bedtime.    Yes Historical Provider, MD  ritonavir (NORVIR) 100 MG capsule Take 100 mg by mouth at bedtime.    Yes Historical Provider, MD  triamterene-hydrochlorothiazide (MAXZIDE-25) 37.5-25 MG per tablet Take 1 tablet by mouth at bedtime.    Yes Historical Provider, MD  azithromycin (ZITHROMAX Z-PAK) 250 MG tablet Take two tablets on day one,  then one tab qd days 2-5 07/04/13   Alexandr Yaworski L. Deneisha Dade, [name]  chlorpheniramine-HYDROcodone (TUSSIONEX PENNKINETIC ER) 10-8 MG/5ML LQCR Take 5 mLs by mouth 2 (two) times daily. 07/04/13   Starlena Beil L. Melizza Kanode, [name]  gabapentin (NEURONTIN) 300 MG capsule Take 300 mg by mouth 3 (three) times  daily.    Historical Provider, MD  predniSONE (DELTASONE) 20 MG tablet Take 3 tablets po qd x 2 days, then 2 tablets po qd x 2 days, then 1 tablet po qd x 2 days 07/04/13   Emelee Rodocker L. Glendell Fouse, [name]  valACYclovir (VALTREX) 500 MG tablet Take 500 mg by mouth 2 (two) times daily as needed.     Historical Provider, MD   BP 131/82  Pulse 116  Temp(Src) 98.1 F (36.7 C) (Oral)  Resp 20  Ht 5\' 11"  (1.803 m)  Wt 237 lb (107.502 kg)  BMI 33.07 kg/m2  SpO2 95% Physical Exam  Nursing note and vitals reviewed. Constitutional: He is oriented to person, place, and time. He appears well-developed and well-nourished. No distress.  HENT:  Head: Normocephalic and atraumatic.  Right Ear: Tympanic membrane and ear canal normal.  Left Ear: Tympanic membrane and ear canal normal.  Mouth/Throat: Uvula is midline, oropharynx is clear and moist and mucous membranes are normal. No oropharyngeal exudate.  Eyes: EOM are normal. Pupils are equal, round, and reactive to light.  Neck: Normal range of motion, full passive range of motion without pain and phonation normal. Neck supple.  Cardiovascular: Normal rate, regular rhythm, normal heart sounds and intact distal pulses.   No murmur heard. Pulmonary/Chest: Effort normal. No stridor. No respiratory distress. He has no wheezes. He has no rales. He exhibits no tenderness.  Coarse lungs sounds bilaterally, slightly diminished.  No wheezing.  Musculoskeletal: He exhibits no edema.  Lymphadenopathy:    He has no cervical adenopathy.  Neurological: He is alert and oriented to person, place, and time. He exhibits normal muscle tone. Coordination normal.  Skin: Skin is warm and dry. No ecchymosis, no lesion, no petechiae, no purpura and no rash noted. Rash is not macular, not maculopapular, not vesicular and not urticarial. No erythema.    ED Course  Procedures (including critical care time) Labs Review Labs Reviewed - No data to display  Imaging Review Dg Chest 2  View  07/04/2013   CLINICAL DATA:  Generalized rash and urticaria.  Hypertension.  EXAM: CHEST  2 VIEW  COMPARISON:  03/18/2013  FINDINGS: The heart size and mediastinal contours are within normal limits. Both lungs are clear. The visualized skeletal structures are unremarkable.  IMPRESSION: No active cardiopulmonary disease.   Electronically Signed   By: Earle Gell M.D.   On: 07/04/2013 11:41    EKG Interpretation None      MDM   Final diagnoses:  Cough   Pt is well appearing, non-toxic.  VSS.  Hx of non-productive cough for several days followed by sudden onset of rash earlier that resolved prior to ED arrival after taking benadryl.  No hx of CP or dyspnea.  Pt has picture on his phone of the rash, which appears c/w hives.  Airway is patent, no angioedema.    Cough improved after medications and albuterol neb.  Inhaler dispensed, zithromax, prednisone taper and tussionex.  Pt agrees to close f/u with PMD and to return here if his sx's worsen.  Pt is feeling better and appears stable for d/c    Kagen Kunath L. Evyn Putzier, [name] 07/06/13 1229

## 2013-07-07 NOTE — ED Provider Notes (Signed)
Medical screening examination/treatment/procedure(s) were performed by non-physician practitioner and as supervising physician I was immediately available for consultation/collaboration.   EKG Interpretation None      Rolland Porter, MD, Abram Sander   Janice Norrie, MD 07/07/13 [phone]

## 2013-10-07 HISTORY — PX: COLONOSCOPY: SHX174

## 2013-10-25 ENCOUNTER — Emergency Department (HOSPITAL_COMMUNITY)
Admission: EM | Admit: 2013-10-25 | Discharge: 2013-10-26 | Disposition: A | Discharge status: Home / Self Care | Payer: Medicare Other | Attending: Emergency Medicine | Admitting: Emergency Medicine

## 2013-10-25 ENCOUNTER — Encounter (HOSPITAL_COMMUNITY): Payer: Self-pay | Admitting: Emergency Medicine

## 2013-10-25 ENCOUNTER — Emergency Department (HOSPITAL_COMMUNITY): Payer: Medicare Other

## 2013-10-25 DIAGNOSIS — K5289 Other specified noninfective gastroenteritis and colitis: Secondary | ICD-10-CM | POA: Insufficient documentation

## 2013-10-25 DIAGNOSIS — J039 Acute tonsillitis, unspecified: Secondary | ICD-10-CM | POA: Diagnosis not present

## 2013-10-25 DIAGNOSIS — Z792 Long term (current) use of antibiotics: Secondary | ICD-10-CM | POA: Insufficient documentation

## 2013-10-25 DIAGNOSIS — I10 Essential (primary) hypertension: Secondary | ICD-10-CM | POA: Insufficient documentation

## 2013-10-25 DIAGNOSIS — Z21 Asymptomatic human immunodeficiency virus [HIV] infection status: Secondary | ICD-10-CM | POA: Diagnosis not present

## 2013-10-25 DIAGNOSIS — Z79899 Other long term (current) drug therapy: Secondary | ICD-10-CM | POA: Diagnosis not present

## 2013-10-25 DIAGNOSIS — K529 Noninfective gastroenteritis and colitis, unspecified: Secondary | ICD-10-CM

## 2013-10-25 DIAGNOSIS — R509 Fever, unspecified: Secondary | ICD-10-CM | POA: Insufficient documentation

## 2013-10-25 HISTORY — DX: Asymptomatic human immunodeficiency virus (hiv) infection status: Z21

## 2013-10-25 LAB — URINE MICROSCOPIC-ADD ON

## 2013-10-25 LAB — URINALYSIS, ROUTINE W REFLEX MICROSCOPIC
Bilirubin Urine: NEGATIVE
GLUCOSE, UA: NEGATIVE mg/dL
Ketones, ur: NEGATIVE mg/dL
Leukocytes, UA: NEGATIVE
Nitrite: NEGATIVE
PROTEIN: 30 mg/dL — AB
Specific Gravity, Urine: 1.025 (ref 1.005–1.030)
Urobilinogen, UA: 1 mg/dL (ref 0.0–1.0)
pH: 6 (ref 5.0–8.0)

## 2013-10-25 LAB — CBC WITH DIFFERENTIAL/PLATELET
BASOS PCT: 0 % (ref 0–1)
Basophils Absolute: 0 10*3/uL (ref 0.0–0.1)
EOS ABS: 0.2 10*3/uL (ref 0.0–0.7)
Eosinophils Relative: 3 % (ref 0–5)
HCT: 40.2 % (ref 39.0–52.0)
Hemoglobin: 13.5 g/dL (ref 13.0–17.0)
LYMPHS ABS: 1.2 10*3/uL (ref 0.7–4.0)
Lymphocytes Relative: 21 % (ref 12–46)
MCH: 28 pg (ref 26.0–34.0)
MCHC: 33.6 g/dL (ref 30.0–36.0)
MCV: 83.4 fL (ref 78.0–100.0)
MONOS PCT: 23 % — AB (ref 3–12)
Monocytes Absolute: 1.3 10*3/uL — ABNORMAL HIGH (ref 0.1–1.0)
Neutro Abs: 3 10*3/uL (ref 1.7–7.7)
Neutrophils Relative %: 53 % (ref 43–77)
Platelets: 123 10*3/uL — ABNORMAL LOW (ref 150–400)
RBC: 4.82 MIL/uL (ref 4.22–5.81)
RDW: 12.9 % (ref 11.5–15.5)
WBC: 5.6 10*3/uL (ref 4.0–10.5)

## 2013-10-25 LAB — BASIC METABOLIC PANEL
Anion gap: 12 (ref 5–15)
BUN: 8 mg/dL (ref 6–23)
CO2: 25 mEq/L (ref 19–32)
CREATININE: 0.56 mg/dL (ref 0.50–1.35)
Calcium: 8.9 mg/dL (ref 8.4–10.5)
Chloride: 102 mEq/L (ref 96–112)
GFR calc Af Amer: >90 mL/min (ref >90)
GLUCOSE: 114 mg/dL — AB (ref 70–99)
POTASSIUM: 3.4 meq/L — AB (ref 3.7–5.3)
Sodium: 139 mEq/L (ref 137–147)

## 2013-10-25 LAB — RAPID STREP SCREEN (MED CTR MEBANE ONLY): STREPTOCOCCUS, GROUP A SCREEN (DIRECT): NEGATIVE

## 2013-10-25 MED ORDER — ONDANSETRON HCL 8 MG PO TABS: 8.0000 mg | ORAL_TABLET | Freq: Three times a day (TID) | ORAL | Status: DC | PRN

## 2013-10-25 MED ORDER — AMOXICILLIN 250 MG PO CAPS
500.0000 mg | ORAL_CAPSULE | Freq: Once | ORAL | Status: AC
  Administered 2013-10-25: 500 mg via ORAL
  Filled 2013-10-25: qty 2

## 2013-10-25 MED ORDER — ONDANSETRON 8 MG PO TBDP
8.0000 mg | ORAL_TABLET | Freq: Once | ORAL | Status: AC
  Administered 2013-10-25: 8 mg via ORAL
  Filled 2013-10-25: qty 1

## 2013-10-25 MED ORDER — HYDROCOD POLST-CHLORPHEN POLST 10-8 MG/5ML PO LQCR
5.0000 mL | Freq: Once | ORAL | Status: AC
  Administered 2013-10-25: 5 mL via ORAL
  Filled 2013-10-25: qty 5

## 2013-10-25 MED ORDER — HYDROCOD POLST-CHLORPHEN POLST 10-8 MG/5ML PO LQCR: 5.0000 mL | Freq: Two times a day (BID) | ORAL | Status: DC | PRN

## 2013-10-25 MED ORDER — AMOXICILLIN 500 MG PO CAPS: 500.0000 mg | ORAL_CAPSULE | Freq: Three times a day (TID) | ORAL | Status: DC

## 2013-10-25 NOTE — ED Provider Notes (Signed)
CSN: [id number]     Arrival date & time 10/25/13  2029 History   First MD Initiated Contact with Patient 10/25/13 2124     Chief Complaint  Patient presents with  . Fever     (Consider location/radiation/quality/duration/timing/severity/associated sxs/prior Treatment) The history is provided by the patient.   Jose Lamb is a 53 y.o. male with a history of HIV (last cd4 count checked last week was 524) presenting with a 2 day history of sore throat with swollen tonsils, fever to 102.8, describing his daughter is currently being treated for strep throat.  However,  He also has complaint of nausea with multiple episodes of vomiting along with watery diarrhea which started yesterday.  He has had a reduced appetite and describes general malaise.  He also reports nonproductive cough but denies chest pain, shortness of breath and abdominal pain.  He has taken tylenol for fever reduction but this has not helped his sore throat.  He reports having a screening colonoscopy 3 days ago and had a formed stool the next day, diarrhea not starting until yesterday.      Past Medical History  Diagnosis Date  . Hypertension   . HIV antibody positive    History reviewed. No pertinent past surgical history. History reviewed. No pertinent family history. History  Substance Use Topics  . Smoking status: Never Smoker   . Smokeless tobacco: Not on file  . Alcohol Use: No    Review of Systems  Constitutional: Positive for fever, chills and fatigue.  HENT: Positive for sore throat. Negative for congestion, ear pain, postnasal drip, rhinorrhea and trouble swallowing.   Eyes: Negative.   Respiratory: Positive for cough. Negative for chest tightness and shortness of breath.   Cardiovascular: Negative for chest pain.  Gastrointestinal: Positive for nausea, vomiting and diarrhea. Negative for abdominal pain.  Genitourinary: Negative.   Musculoskeletal: Positive for myalgias. Negative for arthralgias, joint  swelling and neck pain.  Skin: Negative.  Negative for rash and wound.  Neurological: Positive for weakness. Negative for dizziness, light-headedness, numbness and headaches.  Psychiatric/Behavioral: Negative.       Allergies  Asa  Home Medications   Prior to Admission medications   Medication Sig Start Date End Date Taking? Authorizing Provider  acetaminophen (TYLENOL) 500 MG tablet Take 500 mg by mouth every 6 (six) hours as needed.   Yes Historical Provider, MD  albuterol (PROVENTIL HFA;VENTOLIN HFA) 108 (90 BASE) MCG/ACT inhaler Inhale 2 puffs into the lungs every 6 (six) hours as needed for wheezing or shortness of breath.   Yes Historical Provider, MD  Darunavir Ethanolate (PREZISTA) 800 MG tablet Take 800 mg by mouth at bedtime.    Yes Historical Provider, MD  dextromethorphan (DELSYM) 30 MG/5ML liquid Take 30 mg by mouth as needed for cough.   Yes Historical Provider, MD  emtricitabine-tenofovir (TRUVADA) 200-300 MG per tablet Take 1 tablet by mouth at bedtime.    Yes Historical Provider, MD  Etravirine (INTELENCE) 200 MG TABS Take 2 tablets by mouth at bedtime.    Yes Historical Provider, MD  gabapentin (NEURONTIN) 300 MG capsule Take 300 mg by mouth 3 (three) times daily.   Yes Historical Provider, MD  ritonavir (NORVIR) 100 MG capsule Take 100 mg by mouth at bedtime.    Yes Historical Provider, MD  triamterene-hydrochlorothiazide (MAXZIDE-25) 37.5-25 MG per tablet Take 1 tablet by mouth at bedtime.    Yes Historical Provider, MD  valACYclovir (VALTREX) 500 MG tablet Take 500 mg by mouth 2 (two)  times daily as needed (for  flares).    Yes Historical Provider, MD  amoxicillin (AMOXIL) 500 MG capsule Take 1 capsule (500 mg total) by mouth 3 (three) times daily. 10/25/13   Evalee Jefferson, [name]  chlorpheniramine-HYDROcodone (TUSSIONEX PENNKINETIC ER) 10-8 MG/5ML LQCR Take 5 mLs by mouth every 12 (twelve) hours as needed for cough (and sore throat). 10/25/13   Evalee Jefferson, [name]  ondansetron  (ZOFRAN) 8 MG tablet Take 1 tablet (8 mg total) by mouth every 8 (eight) hours as needed for nausea or vomiting. 10/25/13   Evalee Jefferson, [name]   BP 154/111  Pulse 95  Temp(Src) 98.9 F (37.2 C) (Oral)  Resp 22  Ht 5\' 11"  (1.803 m)  Wt 214 lb 4.8 oz (97.206 kg)  BMI 29.90 kg/m2  SpO2 100% Physical Exam  Nursing note and vitals reviewed. Constitutional: He appears well-developed and well-nourished.  HENT:  Head: Normocephalic and atraumatic.  Right Ear: Tympanic membrane normal.  Left Ear: Tympanic membrane normal.  Nose: Nose normal.  Mouth/Throat: Oropharyngeal exudate and posterior oropharyngeal erythema present.  Eyes: Conjunctivae are normal.  Neck: Normal range of motion. No rigidity. Normal range of motion present.  Cardiovascular: Normal rate, regular rhythm, normal heart sounds and intact distal pulses.   Pulmonary/Chest: Effort normal and breath sounds normal. He has no wheezes. He has no rhonchi.  Abdominal: Soft. Bowel sounds are normal. There is no tenderness. There is no guarding.  Musculoskeletal: Normal range of motion.  Lymphadenopathy:       Head (right side): Tonsillar adenopathy present.       Head (left side): Tonsillar adenopathy present.  Neurological: He is alert.  Skin: Skin is warm and dry.  Psychiatric: He has a normal mood and affect.    ED Course  Procedures (including critical care time) Labs Review Labs Reviewed  BASIC METABOLIC PANEL - Abnormal; Notable for the following:    Potassium 3.4 (*)    Glucose, Bld 114 (*)    All other components within normal limits  CBC WITH DIFFERENTIAL - Abnormal; Notable for the following:    Platelets 123 (*)    Monocytes Relative 23 (*)    Monocytes Absolute 1.3 (*)    All other components within normal limits  URINALYSIS, ROUTINE W REFLEX MICROSCOPIC - Abnormal; Notable for the following:    Hgb urine dipstick TRACE (*)    Protein, ur 30 (*)    All other components within normal limits  RAPID STREP SCREEN   CULTURE, GROUP A STREP  URINE MICROSCOPIC-ADD ON    Imaging Review Dg Chest 2 View  10/25/2013   CLINICAL DATA:  Cough and congestion  EXAM: CHEST  2 VIEW  COMPARISON:  Radiograph 06/24/2013  FINDINGS: Normal cardiac silhouette. Chronic bronchitic markings noted. Infiltrate or edema. No pleural fluid or pneumothorax. Degenerative changes spine.  IMPRESSION: No interval change.  Chronic bronchitic markings.   Electronically Signed   By: Suzy Bouchard M.D.   On: 10/25/2013 23:28     EKG Interpretation None      MDM   Final diagnoses:  Gastroenteritis  Acute tonsillitis   Pt with strep throat exposure with exam suggesting this infection.  Vomiting, diarrhea without abdominal pain, doubt this is SE of colonoscopy as he felt ok with near normal formed stool the day after the procedure.  No free air on cxr , abdomen nontender on exam.  Labs reviewed, unremarkable, plt slightly reduced.  Pt will be treated for strep given exposure and exam.  He was given zofran while here and tolerated PO fluids.  tussionex given with throat pain relief, improved cough.  Amoxil started, first dose given here.      Evalee Jefferson, [name] 10/26/13 0002

## 2013-10-25 NOTE — ED Notes (Signed)
Pt c/o fever and sore throat x 2 days; pt took tylenol 2 hours pta

## 2013-10-26 NOTE — Discharge Instructions (Signed)
Viral Gastroenteritis Viral gastroenteritis is also called stomach flu. This illness is caused by a certain type of germ (virus). It can cause sudden watery poop (diarrhea) and throwing up (vomiting). This can cause you to lose body fluids (dehydration). This illness usually lasts for 3 to 8 days. It usually goes away on its own. HOME CARE   Drink enough fluids to keep your pee (urine) clear or pale yellow. Drink small amounts of fluids often.  Ask your doctor how to replace body fluid losses (rehydration).  Avoid:  Foods high in sugar.  Alcohol.  Bubbly (carbonated) drinks.  Tobacco.  Juice.  Caffeine drinks.  Very hot or cold fluids.  Fatty, greasy foods.  Eating too much at one time.  Dairy products until 24 to 48 hours after your watery poop stops.  You may eat foods with active cultures (probiotics). They can be found in some yogurts and supplements.  Wash your hands well to avoid spreading the illness.  Only take medicines as told by your doctor. Do not give aspirin to children. Do not take medicines for watery poop (antidiarrheals).  Ask your doctor if you should keep taking your regular medicines.  Keep all doctor visits as told. GET HELP RIGHT AWAY IF:   You cannot keep fluids down.  You do not pee at least once every 6 to 8 hours.  You are short of breath.  You see blood in your poop or throw up. This may look like coffee grounds.  You have belly (abdominal) pain that gets worse or is just in one small spot (localized).  You keep throwing up or having watery poop.  You have a fever.  The patient is a child younger than 3 months, and he or she has a fever.  The patient is a child older than 3 months, and he or she has a fever and problems that do not go away.  The patient is a child older than 3 months, and he or she has a fever and problems that suddenly get worse.  The patient is a baby, and he or she has no tears when crying. MAKE SURE YOU:     Understand these instructions.  Will watch your condition.  Will get help right away if you are not doing well or get worse. Document Released: 07/12/2007 Document Revised: 04/17/2011 Document Reviewed: 11/09/2010 [facility] Patient Information 2015 [place], [place]. This information is not intended to replace advice given to you by your health care provider. Make sure you discuss any questions you have with your health care provider.  Tonsillitis Tonsillitis is an infection of the throat that causes the tonsils to become red, tender, and swollen. Tonsils are collections of lymphoid tissue at the back of the throat. Each tonsil has crevices (crypts). Tonsils help fight nose and throat infections and keep infection from spreading to other parts of the body for the first 18 months of life.  CAUSES Sudden (acute) tonsillitis is usually caused by infection with streptococcal bacteria. Long-lasting (chronic) tonsillitis occurs when the crypts of the tonsils become filled with pieces of food and bacteria, which makes it easy for the tonsils to become repeatedly infected. SYMPTOMS  Symptoms of tonsillitis include:  A sore throat, with possible difficulty swallowing.  White patches on the tonsils.  Fever.  Tiredness.  New episodes of snoring during sleep, when you did not snore before.  Small, foul-smelling, yellowish-white pieces of material (tonsilloliths) that you occasionally cough up or spit out. The tonsilloliths can also  cause you to have bad breath. DIAGNOSIS Tonsillitis can be diagnosed through a physical exam. Diagnosis can be confirmed with the results of lab tests, including a throat culture. TREATMENT  The goals of tonsillitis treatment include the reduction of the severity and duration of symptoms and prevention of associated conditions. Symptoms of tonsillitis can be improved with the use of steroids to reduce the swelling. Tonsillitis caused by bacteria can be treated with  antibiotic medicines. Usually, treatment with antibiotic medicines is started before the cause of the tonsillitis is known. However, if it is determined that the cause is not bacterial, antibiotic medicines will not treat the tonsillitis. If attacks of tonsillitis are severe and frequent, your health care provider may recommend surgery to remove the tonsils (tonsillectomy). HOME CARE INSTRUCTIONS   Rest as much as possible and get plenty of sleep.  Drink plenty of fluids. While the throat is very sore, eat soft foods or liquids, such as sherbet, soups, or instant breakfast drinks.  Eat frozen ice pops.  Gargle with a warm or cold liquid to help soothe the throat. Mix 1/4 teaspoon of salt and 1/4 teaspoon of baking soda in 8 oz of water. SEEK MEDICAL CARE IF:   Large, tender lumps develop in your neck.  A rash develops.  A green, yellow-brown, or bloody substance is coughed up.  You are unable to swallow liquids or food for 24 hours.  You notice that only one of the tonsils is swollen. SEEK IMMEDIATE MEDICAL CARE IF:   You develop any new symptoms such as vomiting, severe headache, stiff neck, chest pain, or trouble breathing or swallowing.  You have severe throat pain along with drooling or voice changes.  You have severe pain, unrelieved with recommended medications.  You are unable to fully open the mouth.  You develop redness, swelling, or severe pain anywhere in the neck.  You have a fever. MAKE SURE YOU:   Understand these instructions.  Will watch your condition.  Will get help right away if you are not doing well or get worse. Document Released: 11/02/2004 Document Revised: 06/09/2013 Document Reviewed: 07/12/2012 [facility] Patient Information 2015 [place], [place]. This information is not intended to replace advice given to you by your health care provider. Make sure you discuss any questions you have with your health care provider.   Rest, make sure you are  drinking plenty of fluids.  Take your next dose of amoxil tomorrow morning as soon as you can get it filled.  Do not drive within 4 hours of taking the hydrocodone cough syrup as it may make you sleepy.  You may also take ibuprofen which can help with throat pain and fever.  Use zofran if needed for continued nausea.  Get rechecked if your symptoms are not improving over the next several days.

## 2013-10-26 NOTE — ED Provider Notes (Signed)
Medical screening examination/treatment/procedure(s) were performed by non-physician practitioner and as supervising physician I was immediately available for consultation/collaboration.  Leota Jacobsen, MD 10/26/13 1520

## 2013-10-28 LAB — CULTURE, GROUP A STREP

## 2014-02-04 ENCOUNTER — Encounter (HOSPITAL_COMMUNITY): Payer: Self-pay | Admitting: Emergency Medicine

## 2014-02-04 ENCOUNTER — Emergency Department (HOSPITAL_COMMUNITY): Payer: Medicare Other

## 2014-02-04 ENCOUNTER — Inpatient Hospital Stay (HOSPITAL_COMMUNITY)
Admission: EM | Admit: 2014-02-04 | Discharge: 2014-02-06 | DRG: 381 | Disposition: A | Discharge status: Home / Self Care | Payer: Medicare Other | Attending: Internal Medicine | Admitting: Internal Medicine

## 2014-02-04 DIAGNOSIS — Z6828 Body mass index (BMI) 28.0-28.9, adult: Secondary | ICD-10-CM

## 2014-02-04 DIAGNOSIS — Z792 Long term (current) use of antibiotics: Secondary | ICD-10-CM

## 2014-02-04 DIAGNOSIS — K265 Chronic or unspecified duodenal ulcer with perforation: Secondary | ICD-10-CM | POA: Diagnosis not present

## 2014-02-04 DIAGNOSIS — R1012 Left upper quadrant pain: Secondary | ICD-10-CM | POA: Diagnosis not present

## 2014-02-04 DIAGNOSIS — I10 Essential (primary) hypertension: Secondary | ICD-10-CM | POA: Diagnosis present

## 2014-02-04 DIAGNOSIS — K275 Chronic or unspecified peptic ulcer, site unspecified, with perforation: Secondary | ICD-10-CM | POA: Insufficient documentation

## 2014-02-04 DIAGNOSIS — B20 Human immunodeficiency virus [HIV] disease: Secondary | ICD-10-CM | POA: Diagnosis present

## 2014-02-04 DIAGNOSIS — Z21 Asymptomatic human immunodeficiency virus [HIV] infection status: Secondary | ICD-10-CM | POA: Diagnosis present

## 2014-02-04 DIAGNOSIS — E440 Moderate protein-calorie malnutrition: Secondary | ICD-10-CM | POA: Diagnosis present

## 2014-02-04 DIAGNOSIS — E46 Unspecified protein-calorie malnutrition: Secondary | ICD-10-CM | POA: Diagnosis present

## 2014-02-04 DIAGNOSIS — Z888 Allergy status to other drugs, medicaments and biological substances status: Secondary | ICD-10-CM

## 2014-02-04 LAB — COMPREHENSIVE METABOLIC PANEL
ALBUMIN: 3.5 g/dL (ref 3.5–5.2)
ALK PHOS: 163 U/L — AB (ref 39–117)
ALT: 32 U/L (ref 0–53)
AST: 31 U/L (ref 0–37)
Anion gap: 4 — ABNORMAL LOW (ref 5–15)
BUN: 12 mg/dL (ref 6–23)
CALCIUM: 9.3 mg/dL (ref 8.4–10.5)
CHLORIDE: 106 meq/L (ref 96–112)
CO2: 27 mmol/L (ref 19–32)
CREATININE: 0.55 mg/dL (ref 0.50–1.35)
Glucose, Bld: 159 mg/dL — ABNORMAL HIGH (ref 70–99)
POTASSIUM: 3.5 mmol/L (ref 3.5–5.1)
Sodium: 137 mmol/L (ref 135–145)
TOTAL PROTEIN: 6.7 g/dL (ref 6.0–8.3)
Total Bilirubin: 0.7 mg/dL (ref 0.3–1.2)

## 2014-02-04 LAB — URINALYSIS, ROUTINE W REFLEX MICROSCOPIC
Bilirubin Urine: NEGATIVE
GLUCOSE, UA: NEGATIVE mg/dL
HGB URINE DIPSTICK: NEGATIVE
Ketones, ur: NEGATIVE mg/dL
Leukocytes, UA: NEGATIVE
Nitrite: NEGATIVE
Protein, ur: NEGATIVE mg/dL
Urobilinogen, UA: 0.2 mg/dL (ref 0.0–1.0)
pH: 5.5 (ref 5.0–8.0)

## 2014-02-04 LAB — CBC WITH DIFFERENTIAL/PLATELET
BASOS PCT: 0 % (ref 0–1)
Basophils Absolute: 0 10*3/uL (ref 0.0–0.1)
EOS ABS: 0.2 10*3/uL (ref 0.0–0.7)
Eosinophils Relative: 5 % (ref 0–5)
HEMATOCRIT: 39.7 % (ref 39.0–52.0)
HEMOGLOBIN: 13 g/dL (ref 13.0–17.0)
LYMPHS ABS: 1.4 10*3/uL (ref 0.7–4.0)
Lymphocytes Relative: 42 % (ref 12–46)
MCH: 27.6 pg (ref 26.0–34.0)
MCHC: 32.7 g/dL (ref 30.0–36.0)
MCV: 84.3 fL (ref 78.0–100.0)
MONO ABS: 0.5 10*3/uL (ref 0.1–1.0)
Monocytes Relative: 16 % — ABNORMAL HIGH (ref 3–12)
NEUTROS PCT: 37 % — AB (ref 43–77)
Neutro Abs: 1.3 10*3/uL — ABNORMAL LOW (ref 1.7–7.7)
PLATELETS: 133 10*3/uL — AB (ref 150–400)
RBC: 4.71 MIL/uL (ref 4.22–5.81)
RDW: 13.4 % (ref 11.5–15.5)
WBC: 3.3 10*3/uL — ABNORMAL LOW (ref 4.0–10.5)

## 2014-02-04 LAB — PROTIME-INR
INR: 1.14 (ref 0.00–1.49)
PROTHROMBIN TIME: 14.8 s (ref 11.6–15.2)

## 2014-02-04 LAB — LIPASE, BLOOD: Lipase: 31 U/L (ref 11–59)

## 2014-02-04 MED ORDER — PANTOPRAZOLE SODIUM 40 MG IV SOLR
40.0000 mg | Freq: Once | INTRAVENOUS | Status: AC
  Administered 2014-02-04: 40 mg via INTRAVENOUS
  Filled 2014-02-04: qty 40

## 2014-02-04 MED ORDER — ACETAMINOPHEN 650 MG RE SUPP: 650.0000 mg | Freq: Four times a day (QID) | RECTAL | Status: DC | PRN

## 2014-02-04 MED ORDER — ONDANSETRON HCL 4 MG/2ML IJ SOLN
4.0000 mg | Freq: Three times a day (TID) | INTRAMUSCULAR | Status: DC | PRN
Start: 2014-02-04 — End: 2014-02-05
  Administered 2014-02-05: 4 mg via INTRAVENOUS
  Filled 2014-02-04: qty 2

## 2014-02-04 MED ORDER — PANTOPRAZOLE SODIUM 40 MG IV SOLR
40.0000 mg | Freq: Two times a day (BID) | INTRAVENOUS | Status: DC
  Administered 2014-02-04 – 2014-02-06 (×4): 40 mg via INTRAVENOUS
  Filled 2014-02-04 (×4): qty 40

## 2014-02-04 MED ORDER — GABAPENTIN 300 MG PO CAPS
300.0000 mg | ORAL_CAPSULE | Freq: Three times a day (TID) | ORAL | Status: DC
  Administered 2014-02-04 – 2014-02-06 (×7): 300 mg via ORAL
  Filled 2014-02-04 (×7): qty 1

## 2014-02-04 MED ORDER — IOHEXOL 300 MG/ML  SOLN
100.0000 mL | Freq: Once | INTRAMUSCULAR | Status: AC | PRN
  Administered 2014-02-04: 100 mL via INTRAVENOUS

## 2014-02-04 MED ORDER — SODIUM CHLORIDE 0.9 % IJ SOLN
3.0000 mL | Freq: Two times a day (BID) | INTRAMUSCULAR | Status: DC
  Administered 2014-02-05 – 2014-02-06 (×3): 3 mL via INTRAVENOUS

## 2014-02-04 MED ORDER — HYDROMORPHONE HCL 1 MG/ML IJ SOLN
0.5000 mg | INTRAMUSCULAR | Status: DC | PRN
  Administered 2014-02-04 (×2): 0.5 mg via INTRAVENOUS
  Filled 2014-02-04: qty 1

## 2014-02-04 MED ORDER — DARUNAVIR ETHANOLATE 800 MG PO TABS
800.0000 mg | ORAL_TABLET | Freq: Every day | ORAL | Status: DC
  Administered 2014-02-04 – 2014-02-05 (×2): 800 mg via ORAL
  Filled 2014-02-04 (×5): qty 1

## 2014-02-04 MED ORDER — TRIAMTERENE-HCTZ 37.5-25 MG PO TABS
1.0000 | ORAL_TABLET | Freq: Every day | ORAL | Status: DC
  Administered 2014-02-04 – 2014-02-05 (×2): 1 via ORAL
  Filled 2014-02-04 (×2): qty 1

## 2014-02-04 MED ORDER — IOHEXOL 300 MG/ML  SOLN
25.0000 mL | Freq: Once | INTRAMUSCULAR | Status: AC | PRN
  Administered 2014-02-04: 25 mL via ORAL

## 2014-02-04 MED ORDER — ACETAMINOPHEN 325 MG PO TABS
650.0000 mg | ORAL_TABLET | Freq: Four times a day (QID) | ORAL | Status: DC | PRN
Start: 2014-02-04 — End: 2014-02-06

## 2014-02-04 MED ORDER — SODIUM CHLORIDE 0.9 % IV SOLN
1000.0000 mL | Freq: Once | INTRAVENOUS | Status: AC
  Administered 2014-02-04: 1000 mL via INTRAVENOUS

## 2014-02-04 MED ORDER — SODIUM CHLORIDE 0.9 % IV SOLN
INTRAVENOUS | Status: DC
  Administered 2014-02-05: 15:00:00 via INTRAVENOUS

## 2014-02-04 MED ORDER — RITONAVIR 100 MG PO CAPS
100.0000 mg | ORAL_CAPSULE | Freq: Every day | ORAL | Status: DC
  Administered 2014-02-04 – 2014-02-05 (×2): 100 mg via ORAL
  Filled 2014-02-04 (×5): qty 1

## 2014-02-04 MED ORDER — SODIUM CHLORIDE 0.9 % IV SOLN: 250.0000 mL | INTRAVENOUS | Status: DC | PRN

## 2014-02-04 MED ORDER — SODIUM CHLORIDE 0.9 % IJ SOLN: 3.0000 mL | INTRAMUSCULAR | Status: DC | PRN

## 2014-02-04 MED ORDER — ETRAVIRINE 100 MG PO TABS
400.0000 mg | ORAL_TABLET | Freq: Every day | ORAL | Status: DC
  Administered 2014-02-04 – 2014-02-05 (×2): 400 mg via ORAL
  Filled 2014-02-04 (×5): qty 4

## 2014-02-04 MED ORDER — ONDANSETRON HCL 4 MG/2ML IJ SOLN
4.0000 mg | Freq: Once | INTRAMUSCULAR | Status: AC
  Administered 2014-02-04: 4 mg via INTRAVENOUS
  Filled 2014-02-04: qty 2

## 2014-02-04 MED ORDER — SODIUM CHLORIDE 0.9 % IV SOLN
1000.0000 mL | INTRAVENOUS | Status: DC
  Administered 2014-02-04: 1000 mL via INTRAVENOUS

## 2014-02-04 MED ORDER — HYDROCODONE-ACETAMINOPHEN 5-325 MG PO TABS
1.0000 | ORAL_TABLET | ORAL | Status: DC | PRN
  Administered 2014-02-04 (×2): 2 via ORAL
  Filled 2014-02-04 (×2): qty 2

## 2014-02-04 MED ORDER — EMTRICITABINE-TENOFOVIR DF 200-300 MG PO TABS
1.0000 | ORAL_TABLET | Freq: Every day | ORAL | Status: DC
  Administered 2014-02-04 – 2014-02-05 (×2): 1 via ORAL
  Filled 2014-02-04 (×5): qty 1

## 2014-02-04 MED ORDER — ALBUTEROL SULFATE (2.5 MG/3ML) 0.083% IN NEBU: 2.5000 mg | INHALATION_SOLUTION | Freq: Four times a day (QID) | RESPIRATORY_TRACT | Status: DC | PRN

## 2014-02-04 NOTE — ED Provider Notes (Addendum)
CSN: [id number]     Arrival date & time 02/04/14  0730 History  This chart was scribed for Dorie Rank, MD by Lowella Petties, ED Scribe. The patient was seen in room APA18/APA18. Patient's care was started at 7:45 AM.  Chief Complaint  Patient presents with  . Abdominal Pain   The history is provided by the patient. No language interpreter was used.   HPI Comments: Ernest Popowski is a 53 y.o. male with a history of HIV who presents to the Emergency Department complaining of constant, severe, LUQ, abdominal pain which began one week ago and became acutely worse today. He reports associated nausea, vomiting and diarrhea for three days, three days ago. He states that his nausea and vomiting are exacerbated by eating. He reports taking Truvada for his HIV. He denies history of surgery, kidney stones, or intestinal problems.   Past Medical History  Diagnosis Date  . Hypertension   . HIV antibody positive    History reviewed. No pertinent past surgical history. History reviewed. No pertinent family history. History  Substance Use Topics  . Smoking status: Never Smoker   . Smokeless tobacco: Not on file  . Alcohol Use: No    Review of Systems  Gastrointestinal: Positive for nausea, vomiting and abdominal pain.  A complete 10 system review of systems was obtained and all systems are negative except as noted in the HPI and PMH.   Allergies  Asa  Home Medications   Prior to Admission medications   Medication Sig Start Date End Date Taking? Authorizing Provider  acetaminophen (TYLENOL) 500 MG tablet Take 500 mg by mouth every 6 (six) hours as needed.   Yes Historical Provider, MD  albuterol (PROVENTIL HFA;VENTOLIN HFA) 108 (90 BASE) MCG/ACT inhaler Inhale 2 puffs into the lungs every 6 (six) hours as needed for wheezing or shortness of breath.   Yes Historical Provider, MD  Darunavir Ethanolate (PREZISTA) 800 MG tablet Take 800 mg by mouth at bedtime.    Yes Historical Provider, MD   emtricitabine-tenofovir (TRUVADA) 200-300 MG per tablet Take 1 tablet by mouth at bedtime.    Yes Historical Provider, MD  Etravirine (INTELENCE) 200 MG TABS Take 2 tablets by mouth at bedtime.    Yes Historical Provider, MD  gabapentin (NEURONTIN) 300 MG capsule Take 300 mg by mouth 3 (three) times daily.   Yes Historical Provider, MD  ritonavir (NORVIR) 100 MG capsule Take 100 mg by mouth at bedtime.    Yes Historical Provider, MD  triamterene-hydrochlorothiazide (MAXZIDE-25) 37.5-25 MG per tablet Take 1 tablet by mouth at bedtime.    Yes Historical Provider, MD  valACYclovir (VALTREX) 500 MG tablet Take 500 mg by mouth 2 (two) times daily as needed (for  flares).    Yes Historical Provider, MD  amoxicillin (AMOXIL) 500 MG capsule Take 1 capsule (500 mg total) by mouth 3 (three) times daily. Patient not taking: Reported on 02/04/2014 10/25/13   Evalee Jefferson, [name]  chlorpheniramine-HYDROcodone [facility] ER) 10-8 MG/5ML LQCR Take 5 mLs by mouth every 12 (twelve) hours as needed for cough (and sore throat). Patient not taking: Reported on 02/04/2014 10/25/13   Evalee Jefferson, [name]  ondansetron (ZOFRAN) 8 MG tablet Take 1 tablet (8 mg total) by mouth every 8 (eight) hours as needed for nausea or vomiting. Patient not taking: Reported on 02/04/2014 10/25/13   Evalee Jefferson, [name]   Triage vitals: BP 133/80 mmHg  Pulse 91  Temp(Src) 98.1 F (36.7 C) (Oral)  Resp 16  Ht 5\' 11"  (1.803 m)  Wt 202 lb (91.627 kg)  BMI 28.19 kg/m2  SpO2 96% Physical Exam  Constitutional: He appears well-developed and well-nourished. No distress.  HENT:  Head: Normocephalic and atraumatic.  Right Ear: External ear normal.  Left Ear: External ear normal.  Eyes: Conjunctivae are normal. Right eye exhibits no discharge. Left eye exhibits no discharge. No scleral icterus.  Neck: Neck supple. No tracheal deviation present.  Cardiovascular: Normal rate, regular rhythm and intact distal pulses.   Pulmonary/Chest:  Effort normal and breath sounds normal. No stridor. No respiratory distress. He has no wheezes. He has no rales.  Abdominal: Soft. Bowel sounds are normal. He exhibits no distension and no mass. There is tenderness in the left upper quadrant. There is no rigidity, no rebound, no guarding and no CVA tenderness.  Musculoskeletal: He exhibits no edema or tenderness.  Neurological: He is alert. He has normal strength. No cranial nerve deficit (no facial droop, extraocular movements intact, no slurred speech) or sensory deficit. He exhibits normal muscle tone. He displays no seizure activity. Coordination normal.  Skin: Skin is warm and dry. No rash noted.  Psychiatric: He has a normal mood and affect.  Nursing note and vitals reviewed.   ED Course  Procedures (including critical care time) DIAGNOSTIC STUDIES: Oxygen Saturation is 96% on room air, normal by my interpretation.    COORDINATION OF CARE: 7:50 AM-Discussed treatment plan which includes CXR, lab work, nausea medication, and pain medication with pt at bedside and pt agreed to plan.   Labs Review Labs Reviewed  URINALYSIS, ROUTINE W REFLEX MICROSCOPIC - Abnormal; Notable for the following:    Specific Gravity, Urine >1.030 (*)    All other components within normal limits  CBC WITH DIFFERENTIAL - Abnormal; Notable for the following:    WBC 3.3 (*)    Platelets 133 (*)    Neutrophils Relative % 37 (*)    Neutro Abs 1.3 (*)    Monocytes Relative 16 (*)    All other components within normal limits  COMPREHENSIVE METABOLIC PANEL - Abnormal; Notable for the following:    Glucose, Bld 159 (*)    Alkaline Phosphatase 163 (*)    Anion gap 4 (*)    All other components within normal limits  LIPASE, BLOOD    Imaging Review Dg Chest 2 View  02/04/2014   CLINICAL DATA:  Nausea and left lower quadrant pain  EXAM: CHEST  2 VIEW  COMPARISON:  October 25, 2013  FINDINGS: There is no edema or consolidation. Heart is upper normal in size  with pulmonary vascularity within normal limits. No adenopathy. There is upper thoracic levoscoliosis. There is degenerative change in the thoracic spine.  IMPRESSION: No edema or consolidation.   Electronically Signed   By: Lowella Grip M.D.   On: 02/04/2014 08:37   Ct Abdomen Pelvis W Contrast  02/04/2014   CLINICAL DATA:  One week history of left upper quadrant region pain  EXAM: CT ABDOMEN AND PELVIS WITH CONTRAST  TECHNIQUE: Multidetector CT imaging of the abdomen and pelvis was performed using the standard protocol following bolus administration of intravenous contrast. Oral contrast was also administered.  CONTRAST:  95mL OMNIPAQUE IOHEXOL 300 MG/ML SOLN, 152mL OMNIPAQUE IOHEXOL 300 MG/ML SOLN  COMPARISON:  None.  FINDINGS: There is a 5 mm nodular opacity in the superior segment of the right lower lobe seen on axial slice 2 series 8. There is mild scarring in the left base. There is mild lower  lobe bronchiectatic change bilaterally.  Liver is prominent, measuring 19.1 cm in length. No focal liver lesions are identified. Gallbladder wall is not thickened. There is no appreciable biliary duct dilatation.  Spleen, pancreas, and right adrenal appear normal. There is a nodular lesion in the left adrenal measuring 1.0 x 0.8 cm.  . No other mass lesions are identified. There is no hydronephrosis on either side. There is no renal or ureteral calculus on either side.  In the pelvis, the urinary bladder is midline with normal wall thickness. There are several small prostatic calculi. There is no pelvic mass or fluid collection. There is fat in each inguinal ring.  Appendix appears normal.  There is a small ventral hernia containing only fat.  There is no bowel obstruction. No free air or portal venous air. There is stranding in the mesenteric a adjacent to the the first portion of the duodenum. A small focus of oral contrast extends into this area of inflammation medially, consistent with a small ulcer  perforation. This finding is best appreciated on coronal slice 23 series 5 but is also appreciable on sagittal slice 48 series 6 and axial slice 26 series 3.  There is no ascites, adenopathy, or abscess in the abdomen or pelvis. There is atherosclerotic change in the aorta and iliac arteries but no aneurysm. There is degenerative change in the lumbar spine. There are no blastic or lytic bone lesions.  IMPRESSION: Evidence of oral contrast extending into an area of inflammation just medial to the first portion of the duodenum consistent with a localized ulcer perforation, contained. There is no abscess or free air in this area. This finding may well warrant direct visualization for further assessment.  No bowel obstruction.  Appendix appears normal.  No renal or ureteral calculus.  No hydronephrosis.  Several prostatic calculi are noted.  Small left adrenal mass. This finding may warrant a followup study in 6-12 months to assess for stability.  Critical Value/emergent results were called by telephone at the time of interpretation on 02/04/2014 at 9:50 am to Dr. Dorie Rank , who verbally acknowledged these results.   Electronically Signed   By: Lowella Grip M.D.   On: 02/04/2014 09:51   Medications  0.9 %  sodium chloride infusion (0 mLs Intravenous Stopped 02/04/14 0908)    Followed by  0.9 %  sodium chloride infusion (1,000 mLs Intravenous New Bag/Given 02/04/14 0809)  HYDROmorphone (DILAUDID) injection 0.5 mg (0.5 mg Intravenous Given 02/04/14 1019)  ondansetron (ZOFRAN) injection 4 mg (4 mg Intravenous Given 02/04/14 0810)  iohexol (OMNIPAQUE) 300 MG/ML solution 100 mL (100 mLs Intravenous Contrast Given 02/04/14 0917)  iohexol (OMNIPAQUE) 300 MG/ML solution 25 mL (25 mLs Oral Contrast Given 02/04/14 0916)  pantoprazole (PROTONIX) injection 40 mg (40 mg Intravenous Given 02/04/14 1019)     MDM   Final diagnoses:  Perforated duodenal ulcer   CT scan suggests a perforated duodenal ulcer.  Patient has no peritoneal signs on exam. I'll consult with general surgery. Patient will likely need endoscopy. IV antacid started.  I personally performed the services described in this documentation, which was scribed in my presence.  The recorded information has been reviewed and is accurate.   Dorie Rank, MD 02/04/14 1029  D/w Dr Arnoldo Morale and Dr Sydell Axon.  No intervention or EGD needed at this time.  Will need to be NPO.  Bowel rest.  BID PPI.  They will follow patient.    Dorie Rank, MD 02/04/14 [phone]

## 2014-02-04 NOTE — Consult Note (Signed)
Referring Provider: Dr. Dorie Rank Primary Care Physician:  PROVIDER NOT IN SYSTEM Primary Gastroenterologist:  Dr. Gala Romney   Date of Admission: 02/04/14 Date of Consultation: 02/04/14  Reason for Consultation:    HPI:  Jose Lamb is a 53 y.o. year old male admitted with severe LUQ pain for approximately 1 week with CT findings of contained perforated duodenal ulcer.   LUQ discomfort started just prior to Christmas. Mild pain at that time, worsening until presentation. Described as a "deep pain". Associated N/V with oral intake. No fever/chills. No hematemesis. No melena. Diarrhea Sunday, Monday but now resolved. No NSAIDs, aspirin powders. States a remote history of ulcers in his 63s, no EGD at that time. States he was given some type of ulcer medication. Otherwise, no previous chronic upper GI concerns.   Colonoscopy just performed 2 months ago at [facility].     Past Medical History  Diagnosis Date  . Hypertension   . HIV antibody positive     Past Surgical History  Procedure Laterality Date  . None    . Colonoscopy  Sept 2015    [place]: transverse colon polyp with pathology reporting lymphoid nodule     Prior to Admission medications   Medication Sig Start Date End Date Taking? Authorizing Provider  acetaminophen (TYLENOL) 500 MG tablet Take 500 mg by mouth every 6 (six) hours as needed.   Yes Historical Provider, MD  albuterol (PROVENTIL HFA;VENTOLIN HFA) 108 (90 BASE) MCG/ACT inhaler Inhale 2 puffs into the lungs every 6 (six) hours as needed for wheezing or shortness of breath.   Yes Historical Provider, MD  Darunavir Ethanolate (PREZISTA) 800 MG tablet Take 800 mg by mouth at bedtime.    Yes Historical Provider, MD  emtricitabine-tenofovir (TRUVADA) 200-300 MG per tablet Take 1 tablet by mouth at bedtime.    Yes Historical Provider, MD  Etravirine (INTELENCE) 200 MG TABS Take 2 tablets by mouth at bedtime.    Yes Historical Provider, MD  gabapentin (NEURONTIN)  300 MG capsule Take 300 mg by mouth 3 (three) times daily.   Yes Historical Provider, MD  ritonavir (NORVIR) 100 MG capsule Take 100 mg by mouth at bedtime.    Yes Historical Provider, MD  triamterene-hydrochlorothiazide (MAXZIDE-25) 37.5-25 MG per tablet Take 1 tablet by mouth at bedtime.    Yes Historical Provider, MD  valACYclovir (VALTREX) 500 MG tablet Take 500 mg by mouth 2 (two) times daily as needed (for  flares).    Yes Historical Provider, MD  amoxicillin (AMOXIL) 500 MG capsule Take 1 capsule (500 mg total) by mouth 3 (three) times daily. Patient not taking: Reported on 02/04/2014 10/25/13   Evalee Jefferson, [name]  chlorpheniramine-HYDROcodone [facility]) 10-8 MG/5ML LQCR Take 5 mLs by mouth every 12 (twelve) hours as needed for cough (and sore throat). Patient not taking: Reported on 02/04/2014 10/25/13   Evalee Jefferson, [name]  ondansetron (ZOFRAN) 8 MG tablet Take 1 tablet (8 mg total) by mouth every 8 (eight) hours as needed for nausea or vomiting. Patient not taking: Reported on 02/04/2014 10/25/13   Evalee Jefferson, [name]    Current Facility-Administered Medications  Medication Dose Route Frequency Provider Last Rate Last Dose  . 0.9 %  sodium chloride infusion  1,000 mL Intravenous Continuous Dorie Rank, MD 125 mL/hr at 02/04/14 0809 1,000 mL at 02/04/14 0809   Current Outpatient Prescriptions  Medication Sig Dispense Refill  . acetaminophen (TYLENOL) 500 MG tablet Take 500 mg by mouth every 6 (six) hours as  needed.    [name] albuterol (PROVENTIL HFA;VENTOLIN HFA) 108 (90 BASE) MCG/ACT inhaler Inhale 2 puffs into the lungs every 6 (six) hours as needed for wheezing or shortness of breath.    . Darunavir Ethanolate (PREZISTA) 800 MG tablet Take 800 mg by mouth at bedtime.     [name] emtricitabine-tenofovir (TRUVADA) 200-300 MG per tablet Take 1 tablet by mouth at bedtime.     . Etravirine (INTELENCE) 200 MG TABS Take 2 tablets by mouth at bedtime.     . gabapentin (NEURONTIN) 300 MG capsule  Take 300 mg by mouth 3 (three) times daily.    . ritonavir (NORVIR) 100 MG capsule Take 100 mg by mouth at bedtime.     . triamterene-hydrochlorothiazide (MAXZIDE-25) 37.5-25 MG per tablet Take 1 tablet by mouth at bedtime.     . valACYclovir (VALTREX) 500 MG tablet Take 500 mg by mouth 2 (two) times daily as needed (for  flares).     [name] amoxicillin (AMOXIL) 500 MG capsule Take 1 capsule (500 mg total) by mouth 3 (three) times daily. (Patient not taking: Reported on 02/04/2014) 30 capsule 0  . chlorpheniramine-HYDROcodone (TUSSIONEX PENNKINETIC ER) 10-8 MG/5ML LQCR Take 5 mLs by mouth every 12 (twelve) hours as needed for cough (and sore throat). (Patient not taking: Reported on 02/04/2014) 75 mL 0  . ondansetron (ZOFRAN) 8 MG tablet Take 1 tablet (8 mg total) by mouth every 8 (eight) hours as needed for nausea or vomiting. (Patient not taking: Reported on 02/04/2014) 15 tablet 0    Allergies as of 02/04/2014 - Review Complete 02/04/2014  Allergen Reaction Noted  . Asa [aspirin] Other (See Comments) 12/15/2012    Family History  Problem Relation Age of Onset  . Colon cancer Neg Hx     History   Social History  . Marital Status: Married    Spouse Name: N/A    Number of Children: N/A  . Years of Education: N/A   Occupational History  . Not on file.   Social History Main Topics  . Smoking status: Never Smoker   . Smokeless tobacco: Not on file  . Alcohol Use: No  . Drug Use: No  . Sexual Activity: Not on file   Other Topics Concern  . Not on file   Social History Narrative    Review of Systems: Gen: Denies fever, chills, loss of appetite, change in weight or weight loss CV: Denies chest pain, heart palpitations, syncope, edema  Resp: Denies shortness of breath with rest, cough, wheezing GI: see HPI GU : Denies urinary burning, urinary frequency, urinary incontinence.  MS: Denies joint pain,swelling, cramping Derm: Denies rash, itching, dry skin Psych: Denies depression,  anxiety,confusion, or memory loss Heme: Denies bruising, bleeding, and enlarged lymph nodes.  Physical Exam: Vital signs in last 24 hours: Temp:  [98.1 F (36.7 C)] 98.1 F (36.7 C) (12/30 0740) Pulse Rate:  [90-91] 90 (12/30 1004) Resp:  [16-20] 20 (12/30 1004) BP: (123-133)/(77-80) 123/77 mmHg (12/30 1004) SpO2:  [94 %-96 %] 94 % (12/30 1004) Weight:  [202 lb (91.627 kg)] 202 lb (91.627 kg) (12/30 0740)   General:   Alert,  Well-developed, well-nourished, pleasant and cooperative in NAD Head:  Normocephalic and atraumatic. Eyes:  Sclera clear, no icterus.   Conjunctiva pink. Ears:  Normal auditory acuity. Nose:  No deformity, discharge,  or lesions. Mouth:  No deformity or lesions, dentition normal. Lungs:  Clear throughout to auscultation.   No wheezes, crackles, or rhonchi. No acute distress. Heart:  Regular rate and rhythm; no murmurs, clicks, rubs,  or gallops. Abdomen:  Soft, mild TTP LUQ and nondistended. No masses, hepatosplenomegaly or hernias noted. Normal bowel sounds, without guarding, and without rebound. No peritoneal signs.  Rectal:  Deferred   Msk:  Symmetrical without gross deformities. Normal posture. Extremities:  Without clubbing or edema. Neurologic:  Alert and  oriented x4;  grossly normal neurologically. Skin:  Intact without significant lesions or rashes. Psych:  Alert and cooperative. Normal mood and affect.  Intake/Output from previous day:   Intake/Output this shift:    Lab Results:  Recent Labs  02/04/14 0754  WBC 3.3*  HGB 13.0  HCT 39.7  PLT 133*   BMET  Recent Labs  02/04/14 0754  NA 137  K 3.5  CL 106  CO2 27  GLUCOSE 159*  BUN 12  CREATININE 0.55  CALCIUM 9.3   LFT  Recent Labs  02/04/14 0754  PROT 6.7  ALBUMIN 3.5  AST 31  ALT 32  ALKPHOS 163*  BILITOT 0.7   PT/INR  Recent Labs  02/04/14 0754  LABPROT 14.8  INR 1.14    Studies/Results: Dg Chest 2 View  02/04/2014   CLINICAL DATA:  Nausea and left  lower quadrant pain  EXAM: CHEST  2 VIEW  COMPARISON:  October 25, 2013  FINDINGS: There is no edema or consolidation. Heart is upper normal in size with pulmonary vascularity within normal limits. No adenopathy. There is upper thoracic levoscoliosis. There is degenerative change in the thoracic spine.  IMPRESSION: No edema or consolidation.   Electronically Signed   By: Lowella Grip M.D.   On: 02/04/2014 08:37   Ct Abdomen Pelvis W Contrast  02/04/2014   CLINICAL DATA:  One week history of left upper quadrant region pain  EXAM: CT ABDOMEN AND PELVIS WITH CONTRAST  TECHNIQUE: Multidetector CT imaging of the abdomen and pelvis was performed using the standard protocol following bolus administration of intravenous contrast. Oral contrast was also administered.  CONTRAST:  81mL OMNIPAQUE IOHEXOL 300 MG/ML SOLN, 159mL OMNIPAQUE IOHEXOL 300 MG/ML SOLN  COMPARISON:  None.  FINDINGS: There is a 5 mm nodular opacity in the superior segment of the right lower lobe seen on axial slice 2 series 8. There is mild scarring in the left base. There is mild lower lobe bronchiectatic change bilaterally.  Liver is prominent, measuring 19.1 cm in length. No focal liver lesions are identified. Gallbladder wall is not thickened. There is no appreciable biliary duct dilatation.  Spleen, pancreas, and right adrenal appear normal. There is a nodular lesion in the left adrenal measuring 1.0 x 0.8 cm.  . No other mass lesions are identified. There is no hydronephrosis on either side. There is no renal or ureteral calculus on either side.  In the pelvis, the urinary bladder is midline with normal wall thickness. There are several small prostatic calculi. There is no pelvic mass or fluid collection. There is fat in each inguinal ring.  Appendix appears normal.  There is a small ventral hernia containing only fat.  There is no bowel obstruction. No free air or portal venous air. There is stranding in the mesenteric a adjacent to the  the first portion of the duodenum. A small focus of oral contrast extends into this area of inflammation medially, consistent with a small ulcer perforation. This finding is best appreciated on coronal slice 23 series 5 but is also appreciable on sagittal slice 48 series 6 and axial slice 26 series 3.  There is no ascites, adenopathy, or abscess in the abdomen or pelvis. There is atherosclerotic change in the aorta and iliac arteries but no aneurysm. There is degenerative change in the lumbar spine. There are no blastic or lytic bone lesions.  IMPRESSION: Evidence of oral contrast extending into an area of inflammation just medial to the first portion of the duodenum consistent with a localized ulcer perforation, contained. There is no abscess or free air in this area. This finding may well warrant direct visualization for further assessment.  No bowel obstruction.  Appendix appears normal.  No renal or ureteral calculus.  No hydronephrosis.  Several prostatic calculi are noted.  Small left adrenal mass. This finding may warrant a followup study in 6-12 months to assess for stability.  Critical Value/emergent results were called by telephone at the time of interpretation on 02/04/2014 at 9:50 am to Dr. Dorie Rank , who verbally acknowledged these results.   Electronically Signed   By: Lowella Grip M.D.   On: 02/04/2014 09:51    Impression: 53 year old pleasant male admitted with acute abdominal pain, N/V, found to have a contained perforated duodenal ulcer, denying any NSAIDs, aspirin powders. At beginning of consultation, he was desiring to leave this evening due to an upcoming job tomorrow. However, after much discussion on the risks of this, he has decided to stay. Sister present for discussion. No need for EGD currently; surgery on board as well. Will provide bowel rest, PPI BID, advancing to clear liquids when clinically appropriate.   Plan: H.pylori serology now PPI BID NPO Depending on clinical  scenario, may or may not need EGD as outpatient on a more elective basis. NO EGD warranted at this time while inpatient.  Will continue to follow with you   Orvil Feil, ANP-BC [facility]      LOS: 0 days    02/04/2014, 12:02 PM  Attending note:  Patient seen and examined. CT reviewed.  Patient currently does not appear toxic and does not have peritoneal signs. Agree with antibiotics. Oral (HIV) medications being allowed with just a small amount of water. Patient absolutely denies any form of nonsteroidal agents. Agree with checking H. pylori serologies. Agree with getting a surgical consultation. I think we can continue to hold off on NG tube at this time.

## 2014-02-04 NOTE — ED Notes (Signed)
Patient complaining of upper left quadrant abdominal pain x 1 week. Also states he has had off and on episodes of vomiting x 1 week.

## 2014-02-04 NOTE — H&P (Signed)
History and Physical  Jose Lamb JJK:[id number] DOB: Oct 27, 1960 DOA: 02/04/2014  Referring physician: Dr. Hillard Danker in ED PCP: PROVIDER NOT IN SYSTEM  ID physician: Caprice Red, MD [facility]  Chief Complaint: Abdominal pain  HPI:  53 year old man with well-controlled HIV presented with history of acute on chronic upper abdominal pain. CT abd/pelvis revealed localized duodenal ulcer perforation without abscess or free air. EDP discussed with surgery Dr. Arnoldo Morale and GI Dr. Gala Romney, no intervention or EGD at this time. Bowel rest, BID PPI.  Patient reports subacute abdominal pain going on for some time but became much worse over the last 5 days. He has had some vomiting whenever he tries to eat but no bleeding. Pain is localized in the upper abdomen without specific aggravating or alleviating factors noted. He is not taking any pain medication for this. He does not take any medication for her stomach. He said no systemic symptoms or other complaints.  In the emergency department afebrile, VSS, no hypoxia. CMP with alkaline phosphatase 163. Hgb 13, platelet count 133. U/a normal. CXR no acute disease.  Review of Systems:  Negative for fever, visual changes, sore throat, rash, new muscle aches, chest pain, SOB, dysuria, bleeding  Past Medical History  Diagnosis Date  . Hypertension   . HIV antibody positive     Past Surgical History  Procedure Laterality Date  . None    . Colonoscopy  Sept 2015    [place]: transverse colon polyp with pathology reporting lymphoid nodule     Social History:  reports that he has never smoked. He does not have any smokeless tobacco history on file. He reports that he does not drink alcohol or use illicit drugs.  Allergies  Allergen Reactions  . Asa [Aspirin] Other (See Comments)    Nose bleeds    Family History  Problem Relation Age of Onset  . Colon cancer Neg Hx   . Ulcers Mother      Prior to Admission medications   Medication Sig  Start Date End Date Taking? Authorizing Provider  acetaminophen (TYLENOL) 500 MG tablet Take 500 mg by mouth every 6 (six) hours as needed.   Yes Historical Provider, MD  albuterol (PROVENTIL HFA;VENTOLIN HFA) 108 (90 BASE) MCG/ACT inhaler Inhale 2 puffs into the lungs every 6 (six) hours as needed for wheezing or shortness of breath.   Yes Historical Provider, MD  Darunavir Ethanolate (PREZISTA) 800 MG tablet Take 800 mg by mouth at bedtime.    Yes Historical Provider, MD  emtricitabine-tenofovir (TRUVADA) 200-300 MG per tablet Take 1 tablet by mouth at bedtime.    Yes Historical Provider, MD  Etravirine (INTELENCE) 200 MG TABS Take 2 tablets by mouth at bedtime.    Yes Historical Provider, MD  gabapentin (NEURONTIN) 300 MG capsule Take 300 mg by mouth 3 (three) times daily.   Yes Historical Provider, MD  ritonavir (NORVIR) 100 MG capsule Take 100 mg by mouth at bedtime.    Yes Historical Provider, MD  triamterene-hydrochlorothiazide (MAXZIDE-25) 37.5-25 MG per tablet Take 1 tablet by mouth at bedtime.    Yes Historical Provider, MD  valACYclovir (VALTREX) 500 MG tablet Take 500 mg by mouth 2 (two) times daily as needed (for  flares).    Yes Historical Provider, MD  amoxicillin (AMOXIL) 500 MG capsule Take 1 capsule (500 mg total) by mouth 3 (three) times daily. Patient not taking: Reported on 02/04/2014 10/25/13   Evalee Jefferson, [name]  chlorpheniramine-HYDROcodone [facility] ER) 10-8 MG/5ML [facility] Take 5  mLs by mouth every 12 (twelve) hours as needed for cough (and sore throat). Patient not taking: Reported on 02/04/2014 10/25/13   Evalee Jefferson, [name]  ondansetron (ZOFRAN) 8 MG tablet Take 1 tablet (8 mg total) by mouth every 8 (eight) hours as needed for nausea or vomiting. Patient not taking: Reported on 02/04/2014 10/25/13   Evalee Jefferson, [name]   Physical Exam: Filed Vitals:   02/04/14 0740 02/04/14 1004  BP: 133/80 123/77  Pulse: 91 90  Temp: 98.1 F (36.7 C)   TempSrc: Oral   Resp: 16 20   Height: 5\' 11"  (1.803 m)   Weight: 91.627 kg (202 lb)   SpO2: 96% 94%    General: Examined in the emergency department. Appears calm and comfortable Eyes: PERRL, normal lids, irises ENT: grossly normal hearing, lips & tongue Neck: no LAD, masses or thyromegaly Cardiovascular: RRR, no m/r/g. No LE edema. Respiratory: CTA bilaterally, no w/r/r. Normal respiratory effort. Abdomen: Mild upper abdominal tenderness. Skin: no rash or induration seen  Musculoskeletal: grossly normal tone BUE/BLE Psychiatric: grossly normal mood and affect, speech fluent and appropriate Neurologic: grossly non-focal.  Wt Readings from Last 3 Encounters:  02/04/14 91.627 kg (202 lb)  10/25/13 97.206 kg (214 lb 4.8 oz)  07/04/13 107.502 kg (237 lb)    Labs on Admission:  Basic Metabolic Panel:  Recent Labs Lab 02/04/14 0754  NA 137  K 3.5  CL 106  CO2 27  GLUCOSE 159*  BUN 12  CREATININE 0.55  CALCIUM 9.3    Liver Function Tests:  Recent Labs Lab 02/04/14 0754  AST 31  ALT 32  ALKPHOS 163*  BILITOT 0.7  PROT 6.7  ALBUMIN 3.5    Recent Labs Lab 02/04/14 0754  LIPASE 31    CBC:  Recent Labs Lab 02/04/14 0754  WBC 3.3*  NEUTROABS 1.3*  HGB 13.0  HCT 39.7  MCV 84.3  PLT 133*    Radiological Exams on Admission: Dg Chest 2 View  02/04/2014   CLINICAL DATA:  Nausea and left lower quadrant pain  EXAM: CHEST  2 VIEW  COMPARISON:  October 25, 2013  FINDINGS: There is no edema or consolidation. Heart is upper normal in size with pulmonary vascularity within normal limits. No adenopathy. There is upper thoracic levoscoliosis. There is degenerative change in the thoracic spine.  IMPRESSION: No edema or consolidation.   Electronically Signed   By: Lowella Grip M.D.   On: 02/04/2014 08:37   Ct Abdomen Pelvis W Contrast  02/04/2014   CLINICAL DATA:  One week history of left upper quadrant region pain  EXAM: CT ABDOMEN AND PELVIS WITH CONTRAST  TECHNIQUE: Multidetector CT  imaging of the abdomen and pelvis was performed using the standard protocol following bolus administration of intravenous contrast. Oral contrast was also administered.  CONTRAST:  23mL OMNIPAQUE IOHEXOL 300 MG/ML SOLN, 191mL OMNIPAQUE IOHEXOL 300 MG/ML SOLN  COMPARISON:  None.  FINDINGS: There is a 5 mm nodular opacity in the superior segment of the right lower lobe seen on axial slice 2 series 8. There is mild scarring in the left base. There is mild lower lobe bronchiectatic change bilaterally.  Liver is prominent, measuring 19.1 cm in length. No focal liver lesions are identified. Gallbladder wall is not thickened. There is no appreciable biliary duct dilatation.  Spleen, pancreas, and right adrenal appear normal. There is a nodular lesion in the left adrenal measuring 1.0 x 0.8 cm.  . No other mass lesions are identified. There is no  hydronephrosis on either side. There is no renal or ureteral calculus on either side.  In the pelvis, the urinary bladder is midline with normal wall thickness. There are several small prostatic calculi. There is no pelvic mass or fluid collection. There is fat in each inguinal ring.  Appendix appears normal.  There is a small ventral hernia containing only fat.  There is no bowel obstruction. No free air or portal venous air. There is stranding in the mesenteric a adjacent to the the first portion of the duodenum. A small focus of oral contrast extends into this area of inflammation medially, consistent with a small ulcer perforation. This finding is best appreciated on coronal slice 23 series 5 but is also appreciable on sagittal slice 48 series 6 and axial slice 26 series 3.  There is no ascites, adenopathy, or abscess in the abdomen or pelvis. There is atherosclerotic change in the aorta and iliac arteries but no aneurysm. There is degenerative change in the lumbar spine. There are no blastic or lytic bone lesions.  IMPRESSION: Evidence of oral contrast extending into an area  of inflammation just medial to the first portion of the duodenum consistent with a localized ulcer perforation, contained. There is no abscess or free air in this area. This finding may well warrant direct visualization for further assessment.  No bowel obstruction.  Appendix appears normal.  No renal or ureteral calculus.  No hydronephrosis.  Several prostatic calculi are noted.  Small left adrenal mass. This finding may warrant a followup study in 6-12 months to assess for stability.  Critical Value/emergent results were called by telephone at the time of interpretation on 02/04/2014 at 9:50 am to Dr. Dorie Rank , who verbally acknowledged these results.   Electronically Signed   By: Lowella Grip M.D.   On: 02/04/2014 09:51    Principal Problem:   Duodenal ulcer perforation Active Problems:   HIV (human immunodeficiency virus infection)   Assessment/Plan 1. Perforated duodenal ulcer 2. HIV diagnosed 1993, very well controlled: Viral load undetectable, CD4 579 on last check 12/17   Hemodynamics stable, no evidence of acute complication. Admit to medical floor.   PPI BID, pain control  GI and surgery consults pending  Code Status: full code  DVT prophylaxis: SCDs Family Communication: none present Disposition Plan/Anticipated LOS: obs, 1-2 days  Time spent: 32 minutes  Murray Hodgkins, MD  [facility] Pager [phone] 02/04/2014, 11:42 AM

## 2014-02-05 ENCOUNTER — Telehealth: Payer: Self-pay | Admitting: Nurse Practitioner

## 2014-02-05 DIAGNOSIS — E46 Unspecified protein-calorie malnutrition: Secondary | ICD-10-CM | POA: Diagnosis present

## 2014-02-05 DIAGNOSIS — R1012 Left upper quadrant pain: Secondary | ICD-10-CM | POA: Diagnosis present

## 2014-02-05 DIAGNOSIS — Z6828 Body mass index (BMI) 28.0-28.9, adult: Secondary | ICD-10-CM | POA: Diagnosis not present

## 2014-02-05 DIAGNOSIS — Z21 Asymptomatic human immunodeficiency virus [HIV] infection status: Secondary | ICD-10-CM | POA: Diagnosis not present

## 2014-02-05 DIAGNOSIS — I10 Essential (primary) hypertension: Secondary | ICD-10-CM | POA: Diagnosis not present

## 2014-02-05 DIAGNOSIS — Z888 Allergy status to other drugs, medicaments and biological substances status: Secondary | ICD-10-CM | POA: Diagnosis not present

## 2014-02-05 DIAGNOSIS — K265 Chronic or unspecified duodenal ulcer with perforation: Secondary | ICD-10-CM | POA: Diagnosis not present

## 2014-02-05 DIAGNOSIS — E440 Moderate protein-calorie malnutrition: Secondary | ICD-10-CM | POA: Diagnosis not present

## 2014-02-05 DIAGNOSIS — Z792 Long term (current) use of antibiotics: Secondary | ICD-10-CM | POA: Diagnosis not present

## 2014-02-05 LAB — H. PYLORI ANTIBODY, IGG: H PYLORI IGG: 0.89 {ISR}

## 2014-02-05 MED ORDER — PRO-STAT SUGAR FREE PO LIQD
30.0000 mL | Freq: Three times a day (TID) | ORAL | Status: DC
  Administered 2014-02-05 – 2014-02-06 (×4): 30 mL via ORAL
  Filled 2014-02-05 (×4): qty 30

## 2014-02-05 MED ORDER — BOOST / RESOURCE BREEZE PO LIQD
1.0000 | Freq: Three times a day (TID) | ORAL | Status: DC
  Administered 2014-02-05 – 2014-02-06 (×3): 1 via ORAL

## 2014-02-05 NOTE — Progress Notes (Signed)
Subjective: 53 year old male admitted for contained perforated duodenal ulcer. Currently NPO, H. Pylori serology in process not resulted as of yet. Is not having any abdominal pain since 1800 yesterday. Last pain meds were Dilaudid 0.5mg  at 1019 am yesterday and Norco 2 tabs at 1827 pm yesterday. Is taking small sips of water with meds otherwise NPO. Per patient had one episode of emesis approximately 0200 this morning which consisted of just minimal water and gastric contents. Received IV Zofran 4mg  with no recurrent nausea or vomiting. No hematemesis noted. No bowel movement since yesterday.  Objective: Vital signs in last 24 hours: Temp:  [98.2 F (36.8 C)-98.6 F (37 C)] 98.6 F (37 C) (12/31 0543) Pulse Rate:  [81-93] 93 (12/31 0543) Resp:  [18-20] 18 (12/31 0543) BP: (122-138)/(53-77) 129/71 mmHg (12/31 0543) SpO2:  [93 %-97 %] 93 % (12/31 0753) Weight:  [202 lb (91.627 kg)] 202 lb (91.627 kg) (12/30 1319) Last BM Date: 02/04/14 General:   Alert and oriented, pleasant Head:  Normocephalic and atraumatic. Eyes:  No icterus, sclera clear. Conjuctiva pink.  Heart:  S1, S2 present, no murmurs noted.  Lungs: Clear to auscultation bilaterally, without wheezing, rales, or rhonchi.  Abdomen:  Bowel sounds present, soft, non-tender, non-distended. No HSM or hernias noted. No rebound or guarding. No masses appreciated  Pulses:  Normal pulses noted at radial and DP. Extremities:  Without clubbing or edema. Neurologic:  Alert and  oriented x4;  grossly normal neurologically. Skin:  Warm and dry, intact without significant lesions.  Psych:  Alert and cooperative. Normal mood and affect.  Intake/Output from previous day: 12/30 0701 - 12/31 0700 In: 923.8 [I.V.:923.8] Out: 1900 [Urine:1900]  Lab Results:  Recent Labs  02/04/14 0754  WBC 3.3*  HGB 13.0  HCT 39.7  PLT 133*   BMET  Recent Labs  02/04/14 0754  NA 137  K 3.5  CL 106  CO2 27  GLUCOSE 159*  BUN 12   CREATININE 0.55  CALCIUM 9.3   LFT  Recent Labs  02/04/14 0754  PROT 6.7  ALBUMIN 3.5  AST 31  ALT 32  ALKPHOS 163*  BILITOT 0.7   PT/INR  Recent Labs  02/04/14 0754  LABPROT 14.8  INR 1.14    Studies/Results: Dg Chest 2 View  02/04/2014   CLINICAL DATA:  Nausea and left lower quadrant pain  EXAM: CHEST  2 VIEW  COMPARISON:  October 25, 2013  FINDINGS: There is no edema or consolidation. Heart is upper normal in size with pulmonary vascularity within normal limits. No adenopathy. There is upper thoracic levoscoliosis. There is degenerative change in the thoracic spine.  IMPRESSION: No edema or consolidation.   Electronically Signed   By: Lowella Grip M.D.   On: 02/04/2014 08:37   Ct Abdomen Pelvis W Contrast  02/04/2014   CLINICAL DATA:  One week history of left upper quadrant region pain  EXAM: CT ABDOMEN AND PELVIS WITH CONTRAST  TECHNIQUE: Multidetector CT imaging of the abdomen and pelvis was performed using the standard protocol following bolus administration of intravenous contrast. Oral contrast was also administered.  CONTRAST:  81mL OMNIPAQUE IOHEXOL 300 MG/ML SOLN, 168mL OMNIPAQUE IOHEXOL 300 MG/ML SOLN  COMPARISON:  None.  FINDINGS: There is a 5 mm nodular opacity in the superior segment of the right lower lobe seen on axial slice 2 series 8. There is mild scarring in the left base. There is mild lower lobe bronchiectatic change bilaterally.  Liver is prominent, measuring  19.1 cm in length. No focal liver lesions are identified. Gallbladder wall is not thickened. There is no appreciable biliary duct dilatation.  Spleen, pancreas, and right adrenal appear normal. There is a nodular lesion in the left adrenal measuring 1.0 x 0.8 cm.  . No other mass lesions are identified. There is no hydronephrosis on either side. There is no renal or ureteral calculus on either side.  In the pelvis, the urinary bladder is midline with normal wall thickness. There are several small  prostatic calculi. There is no pelvic mass or fluid collection. There is fat in each inguinal ring.  Appendix appears normal.  There is a small ventral hernia containing only fat.  There is no bowel obstruction. No free air or portal venous air. There is stranding in the mesenteric a adjacent to the the first portion of the duodenum. A small focus of oral contrast extends into this area of inflammation medially, consistent with a small ulcer perforation. This finding is best appreciated on coronal slice 23 series 5 but is also appreciable on sagittal slice 48 series 6 and axial slice 26 series 3.  There is no ascites, adenopathy, or abscess in the abdomen or pelvis. There is atherosclerotic change in the aorta and iliac arteries but no aneurysm. There is degenerative change in the lumbar spine. There are no blastic or lytic bone lesions.  IMPRESSION: Evidence of oral contrast extending into an area of inflammation just medial to the first portion of the duodenum consistent with a localized ulcer perforation, contained. There is no abscess or free air in this area. This finding may well warrant direct visualization for further assessment.  No bowel obstruction.  Appendix appears normal.  No renal or ureteral calculus.  No hydronephrosis.  Several prostatic calculi are noted.  Small left adrenal mass. This finding may warrant a followup study in 6-12 months to assess for stability.  Critical Value/emergent results were called by telephone at the time of interpretation on 02/04/2014 at 9:50 am to Dr. Dorie Rank , who verbally acknowledged these results.   Electronically Signed   By: Lowella Grip M.D.   On: 02/04/2014 09:51    Assessment:  1. Duodenal ulcer perforation: ulcer perforation contained on CT. Non-toxic appearance with positive bowel sonds and no abdominal TTP, rigidits, guarding, or rebound. Improvement in abdominal pain since admission. One episode of nausea and vomiting with no recurrence after IV  Zofran. H. Pylori serologies still pending. Overall appears to be doing well, improving.  Plan:  1. Continue PPI as ordered 2. Monitor for H. Pylori serologies 3. Consider advancing diet to clear liquids if continued improvement and no recurrence N/V 4. Consider outpatient EGD in the future  Walden Field, AGNP-C Adult & Gerontological Nurse Practitioner [facility]   LOS: 1 day    02/05/2014, 8:24 AM   Attending note:  Discussed with Dr. Arnoldo Morale earlier today. He really looks much better today. His abdominal exam is essentially benign. Agree with advancing to a clear liquid diet today. If tolerated, onto a low-residue full liquid diet tomorrow. Home on a twice a day PPI. Follow up on H. pylori serologies.

## 2014-02-05 NOTE — Progress Notes (Signed)
UR completed 

## 2014-02-05 NOTE — Care Management Note (Addendum)
    Page 1 of 1   02/06/2014     3:09:29 PM CARE MANAGEMENT NOTE 02/06/2014  Patient:  Jose Lamb, Jose Lamb   Account Number:  [id number]  Date Initiated:  02/05/2014  Documentation initiated by:  Jolene Provost  Subjective/Objective Assessment:   Pt admitted for abd pain. Pt is from home with self care and independent at baseline. Pt plans to return home at discharge and has no CM needs.     Action/Plan:   Anticipated DC Date:  02/06/2014   Anticipated DC Plan:  [facility]  CM consult      Choice offered to / List presented to:             Status of service:  Completed, signed off Medicare Important Message given?  YES (If response is "NO", the following Medicare IM given date fields will be blank) Date Medicare IM given:  02/06/2014 Medicare IM given by:  Vladimir Creeks Date Additional Medicare IM given:   Additional Medicare IM given by:    Discharge Disposition:  HOME/SELF CARE  Per UR Regulation:    If discussed at Long Length of Stay Meetings, dates discussed:    Comments:  02/06/14 1500 Vladimir Creeks RN/CM 02/05/2014 [place], RN, MSN, [facility]

## 2014-02-05 NOTE — Progress Notes (Signed)
INITIAL NUTRITION ASSESSMENT  DOCUMENTATION CODES Per approved criteria  -Moderate malnutrition in the context of acute illness    INTERVENTION:  Resource Breeze po TID, each supplement provides 250 kcal and 9 grams of protein   ProStat 30 ml TID (each 30 ml provides 100 kcal, 15 gr protein) pending diet being fully advanced  NUTRITION DIAGNOSIS: Inadequate oral itnake  related to duodenal ulcer perforation limiting po intake prior to admission and requiring bowel rest as evidenced by NPO/clear liquids status and unitentional weight loss trend.   Goal: Pt will tolerate diet advancement as deemed medically appropriate and consume sufficient protein and energy to meet estimated needs and  prevent further unplanned weight loss.  Monitor:  Receptivity to oral nutrition and diet advancement progression, labs  Reason for Assessment: Malnutrition Screen   53 y.o. male  Admitting Dx: Duodenal ulcer perforation  ASSESSMENT: Pt presents with duodenal ulcer perforation per abdominal/pelvis CT. Currently on bowel rest. No surgery planned at this time.  Diet Hx: pt has been experiencing difficulty tolerating solid foods for past 3 weeks due to abdominal pain. He says it worsened on Christmas after eating meatballs. Pt has been eating primarily cereal (frosted flakes and Sugar Pops). His diet is being advanced to clears. He vomited at 0230 this morning but says he's feeling much better now. Pt has experienced unintentional weight loss of 6% over past 90 days and 15% over past 7 months which is significant. He was last assessed in ED 9/19 when he presented with vomiting and diarrhea ([id number] and acute tonsillitis). Nutrition focused exam: mild fat mass depletion observed to orbital and upper arms.  Pt meets criteria for moderate MALNUTRITION in the context of acute illness  as evidenced by energy intake <75% for >7 days and mild fat mass depletion orbital and upper arms.  Height: Ht  Readings from Last 1 Encounters:  02/04/14 5\' 11"  (1.803 m)    Weight: Wt Readings from Last 1 Encounters:  02/04/14 202 lb (91.627 kg)    Ideal Body Weight: 172# (78 kg)  % Ideal Body Weight: 117%  Wt Readings from Last 10 Encounters:  02/04/14 202 lb (91.627 kg)  10/25/13 214 lb 4.8 oz (97.206 kg)  07/04/13 237 lb (107.502 kg)  03/18/13 257 lb (116.574 kg)  12/15/12 234 lb (106.142 kg)  04/16/12 226 lb (102.513 kg)    Usual Body Weight: 230-240#  % Usual Body Weight: 88%  BMI:  Body mass index is 28.19 kg/(m^2). overweight  Estimated Nutritional Needs: Kcal: 2275-2450 Protein: 110-130 gr Fluid: 2.3-2.5 liters daily  Skin: intact  Diet Order: Clear liquids  EDUCATION NEEDS: -No education needs identified at this time   Intake/Output Summary (Last 24 hours) at 02/05/14 0942 Last data filed at 02/05/14 0600  Gross per 24 hour  Intake 923.75 ml  Output   1900 ml  Net -976.25 ml    Last BM: 12/30   Labs:   Recent Labs Lab 02/04/14 0754  NA 137  K 3.5  CL 106  CO2 27  BUN 12  CREATININE 0.55  CALCIUM 9.3  GLUCOSE 159*    CBG (last 3)  No results for input(s): GLUCAP in the last 72 hours.  Scheduled Meds: . Darunavir Ethanolate  800 mg Oral QHS  . emtricitabine-tenofovir  1 tablet Oral QHS  . etravirine  400 mg Oral QHS  . gabapentin  300 mg Oral TID  . pantoprazole (PROTONIX) IV  40 mg Intravenous Q12H  . ritonavir  100 mg  Oral QHS  . sodium chloride  3 mL Intravenous Q12H  . triamterene-hydrochlorothiazide  1 tablet Oral QHS    Continuous Infusions: . sodium chloride 75 mL/hr at 02/04/14 1744    Past Medical History  Diagnosis Date  . Hypertension   . HIV antibody positive     Past Surgical History  Procedure Laterality Date  . None    . Colonoscopy  Sept 2015    [place]: transverse colon polyp with pathology reporting lymphoid nodule    Colman Cater MS,RD,CSG,LDN Office: #[phone] Pager: [phone]

## 2014-02-05 NOTE — Telephone Encounter (Signed)
Please schedule this patient for a post-hosp follow-up for 2 weeks after he is discharged (can use urgent slot if needed). Also please include "duodenal perf/possible EGD" in the comments. Thanks!

## 2014-02-05 NOTE — Consult Note (Signed)
Reason for Consult: Perforated duodenal ulcer Referring Physician: Hospitalist  Jose Lamb is an 53 y.o. male.  HPI: Patient is a 53 year old black male who presented emergency room with worsening upper abdominal pain. CT scan of the abdomen revealed a contained perforated ulcer. Patient states that this is his first episode. Patient was admitted to the hospital for further evaluation treatment. While in the hospital, his upper abdominal pain has resolved. He has had no nausea or vomiting.  Past Medical History  Diagnosis Date  . Hypertension   . HIV antibody positive     Past Surgical History  Procedure Laterality Date  . None    . Colonoscopy  Sept 2015    [place]: transverse colon polyp with pathology reporting lymphoid nodule     Family History  Problem Relation Age of Onset  . Colon cancer Neg Hx   . Ulcers Mother     Social History:  reports that he has never smoked. He does not have any smokeless tobacco history on file. He reports that he does not drink alcohol or use illicit drugs.  Allergies:  Allergies  Allergen Reactions  . Asa [Aspirin] Other (See Comments)    Nose bleeds    Medications: I have reviewed the patient's current medications.  Results for orders placed or performed during the hospital encounter of 02/04/14 (from the past 48 hour(s))  Urinalysis, Routine w reflex microscopic     Status: Abnormal   Collection Time: 02/04/14  7:52 AM  Result Value Ref Range   Color, Urine YELLOW YELLOW   APPearance CLEAR CLEAR   Specific Gravity, Urine >1.030 (H) 1.005 - 1.030   pH 5.5 5.0 - 8.0   Glucose, UA NEGATIVE NEGATIVE mg/dL   Hgb urine dipstick NEGATIVE NEGATIVE   Bilirubin Urine NEGATIVE NEGATIVE   Ketones, ur NEGATIVE NEGATIVE mg/dL   Protein, ur NEGATIVE NEGATIVE mg/dL   Urobilinogen, UA 0.2 0.0 - 1.0 mg/dL   Nitrite NEGATIVE NEGATIVE   Leukocytes, UA NEGATIVE NEGATIVE    Comment: MICROSCOPIC NOT DONE ON URINES WITH NEGATIVE PROTEIN, BLOOD,  LEUKOCYTES, NITRITE, OR GLUCOSE <1000 mg/dL.  CBC WITH DIFFERENTIAL     Status: Abnormal   Collection Time: 02/04/14  7:54 AM  Result Value Ref Range   WBC 3.3 (L) 4.0 - 10.5 K/uL   RBC 4.71 4.22 - 5.81 MIL/uL   Hemoglobin 13.0 13.0 - 17.0 g/dL   HCT 39.7 39.0 - 52.0 %   MCV 84.3 78.0 - 100.0 fL   MCH 27.6 26.0 - 34.0 pg   MCHC 32.7 30.0 - 36.0 g/dL   RDW 13.4 11.5 - 15.5 %   Platelets 133 (L) 150 - 400 K/uL   Neutrophils Relative % 37 (L) 43 - 77 %   Neutro Abs 1.3 (L) 1.7 - 7.7 K/uL   Lymphocytes Relative 42 12 - 46 %   Lymphs Abs 1.4 0.7 - 4.0 K/uL   Monocytes Relative 16 (H) 3 - 12 %   Monocytes Absolute 0.5 0.1 - 1.0 K/uL   Eosinophils Relative 5 0 - 5 %   Eosinophils Absolute 0.2 0.0 - 0.7 K/uL   Basophils Relative 0 0 - 1 %   Basophils Absolute 0.0 0.0 - 0.1 K/uL  Comprehensive metabolic panel     Status: Abnormal   Collection Time: 02/04/14  7:54 AM  Result Value Ref Range   Sodium 137 135 - 145 mmol/L    Comment: Please note change in reference range.   Potassium 3.5 3.5 -  5.1 mmol/L    Comment: Please note change in reference range.   Chloride 106 96 - 112 mEq/L   CO2 27 19 - 32 mmol/L   Glucose, Bld 159 (H) 70 - 99 mg/dL   BUN 12 6 - 23 mg/dL   Creatinine, Ser 0.55 0.50 - 1.35 mg/dL   Calcium 9.3 8.4 - 10.5 mg/dL   Total Protein 6.7 6.0 - 8.3 g/dL   Albumin 3.5 3.5 - 5.2 g/dL   AST 31 0 - 37 U/L   ALT 32 0 - 53 U/L   Alkaline Phosphatase 163 (H) 39 - 117 U/L   Total Bilirubin 0.7 0.3 - 1.2 mg/dL   GFR calc non Af Amer >90 >90 mL/min   GFR calc Af Amer >90 >90 mL/min    Comment: (NOTE) The eGFR has been calculated using the CKD EPI equation. This calculation has not been validated in all clinical situations. eGFR's persistently <90 mL/min signify possible Chronic Kidney Disease.    Anion gap 4 (L) 5 - 15  Lipase, blood     Status: None   Collection Time: 02/04/14  7:54 AM  Result Value Ref Range   Lipase 31 11 - 59 U/L  Protime-INR     Status: None    Collection Time: 02/04/14  7:54 AM  Result Value Ref Range   Prothrombin Time 14.8 11.6 - 15.2 seconds   INR 1.14 0.00 - 1.49    Dg Chest 2 View  02/04/2014   CLINICAL DATA:  Nausea and left lower quadrant pain  EXAM: CHEST  2 VIEW  COMPARISON:  October 25, 2013  FINDINGS: There is no edema or consolidation. Heart is upper normal in size with pulmonary vascularity within normal limits. No adenopathy. There is upper thoracic levoscoliosis. There is degenerative change in the thoracic spine.  IMPRESSION: No edema or consolidation.   Electronically Signed   By: Lowella Grip M.D.   On: 02/04/2014 08:37   Ct Abdomen Pelvis W Contrast  02/04/2014   CLINICAL DATA:  One week history of left upper quadrant region pain  EXAM: CT ABDOMEN AND PELVIS WITH CONTRAST  TECHNIQUE: Multidetector CT imaging of the abdomen and pelvis was performed using the standard protocol following bolus administration of intravenous contrast. Oral contrast was also administered.  CONTRAST:  19m OMNIPAQUE IOHEXOL 300 MG/ML SOLN, 1061mOMNIPAQUE IOHEXOL 300 MG/ML SOLN  COMPARISON:  None.  FINDINGS: There is a 5 mm nodular opacity in the superior segment of the right lower lobe seen on axial slice 2 series 8. There is mild scarring in the left base. There is mild lower lobe bronchiectatic change bilaterally.  Liver is prominent, measuring 19.1 cm in length. No focal liver lesions are identified. Gallbladder wall is not thickened. There is no appreciable biliary duct dilatation.  Spleen, pancreas, and right adrenal appear normal. There is a nodular lesion in the left adrenal measuring 1.0 x 0.8 cm.  . No other mass lesions are identified. There is no hydronephrosis on either side. There is no renal or ureteral calculus on either side.  In the pelvis, the urinary bladder is midline with normal wall thickness. There are several small prostatic calculi. There is no pelvic mass or fluid collection. There is fat in each inguinal ring.   Appendix appears normal.  There is a small ventral hernia containing only fat.  There is no bowel obstruction. No free air or portal venous air. There is stranding in the mesenteric a adjacent to the the  first portion of the duodenum. A small focus of oral contrast extends into this area of inflammation medially, consistent with a small ulcer perforation. This finding is best appreciated on coronal slice 23 series 5 but is also appreciable on sagittal slice 48 series 6 and axial slice 26 series 3.  There is no ascites, adenopathy, or abscess in the abdomen or pelvis. There is atherosclerotic change in the aorta and iliac arteries but no aneurysm. There is degenerative change in the lumbar spine. There are no blastic or lytic bone lesions.  IMPRESSION: Evidence of oral contrast extending into an area of inflammation just medial to the first portion of the duodenum consistent with a localized ulcer perforation, contained. There is no abscess or free air in this area. This finding may well warrant direct visualization for further assessment.  No bowel obstruction.  Appendix appears normal.  No renal or ureteral calculus.  No hydronephrosis.  Several prostatic calculi are noted.  Small left adrenal mass. This finding may warrant a followup study in 6-12 months to assess for stability.  Critical Value/emergent results were called by telephone at the time of interpretation on 02/04/2014 at 9:50 am to Dr. Dorie Rank , who verbally acknowledged these results.   Electronically Signed   By: Lowella Grip M.D.   On: 02/04/2014 09:51    ROS: See chart Blood pressure 129/71, pulse 93, temperature 98.6 F (37 C), temperature source Oral, resp. rate 18, height '5\' 11"'  (1.803 m), weight 91.627 kg (202 lb), SpO2 93 %. Physical Exam: Pleasant black male in no acute distress. Abdomen is soft, nontender, nondistended.  No rigidity is noted.  Assessment/Plan: Impression: Contained perforated duodenal ulcer. No need for  surgical intervention at this time. Will defer to GI for further management and treatment. Will follow peripherally with you.  Hilarie Sinha A 02/05/2014, 8:47 AM

## 2014-02-05 NOTE — H&P (Signed)
  PROGRESS NOTE  Jose Lamb HKV:[id number] DOB: [birth year] DOA: 02/04/2014 PCP: PROVIDER NOT IN SYSTEM ID physician: Caprice Red, MD [facility]  Summary: 53 year old man with well-controlled HIV presented with history of acute on chronic upper abdominal pain. CT abd/pelvis revealed localized duodenal ulcer perforation without abscess or free air. Admitted for bowel rest, made nothing by mouth. Patient was seen in consultation with GI and general surgery with recommendations to advance to clear liquids today. If tolerates, likely advance to full liquids tomorrow.  Assessment/Plan: 1. Perforated duodenal ulcer. Appears stable. No evidence of complicating features at this time. 2. HIV, well controlled. 3. Moderate malnutrition the context of acute illness   Gen. surgery recommends against any surgical intervention at this time. Clear liquid diet today per GI, if tolerates then full liquid diet tomorrow. Home on twice a day PPI. Follow-up H. pylori serologies.  Code Status: full code DVT prophylaxis: SCDs Family Communication: wife at bedside Disposition Plan: home  Murray Hodgkins, MD  [facility]  Pager [phone] If 7PM-7AM, please contact night-coverage at [email or web address], password [facility] 02/05/2014, 3:16 PM  LOS: 1 day   Consultants:  Gastroenterology  Gen. surgery  Procedures:    Antibiotics:    HPI/Subjective: GI recommended advancing diet to clear liquids today, surgery recommends no intervention at this time.  Feeling better. No pain. Tolerating liquid diet. Wife and children at bedside.  Objective: Filed Vitals:   02/04/14 2120 02/05/14 0543 02/05/14 0753 02/05/14 1457  BP: 122/53 129/71  134/78  Pulse: 84 93  93  Temp: 98.4 F (36.9 C) 98.6 F (37 C)  99.3 F (37.4 C)  TempSrc: Oral Oral  Oral  Resp: 18 18    Height:      Weight:      SpO2: 97% 93% 93% 96%    Intake/Output Summary (Last 24 hours) at 02/05/14 1516 Last data filed at 02/05/14  1458  Gross per 24 hour  Intake 1166.75 ml  Output   3500 ml  Net -2333.25 ml     Filed Weights   02/04/14 0740 02/04/14 1319  Weight: 91.627 kg (202 lb) 91.627 kg (202 lb)    Exam:    Afebrile, vital signs stable. No hypoxia. General:  Appears calm and comfortable Cardiovascular: RRR, no m/r/g.  Respiratory: CTA bilaterally, no w/r/r. Normal respiratory effort. Abdomen: soft, ntnd Psychiatric: grossly normal mood and affect, speech fluent and appropriate  Scheduled Meds: . Darunavir Ethanolate  800 mg Oral QHS  . emtricitabine-tenofovir  1 tablet Oral QHS  . etravirine  400 mg Oral QHS  . feeding supplement (PRO-STAT SUGAR FREE 64)  30 mL Oral TID WC  . feeding supplement (RESOURCE BREEZE)  1 Container Oral TID BM  . gabapentin  300 mg Oral TID  . pantoprazole (PROTONIX) IV  40 mg Intravenous Q12H  . ritonavir  100 mg Oral QHS  . sodium chloride  3 mL Intravenous Q12H  . triamterene-hydrochlorothiazide  1 tablet Oral QHS   Continuous Infusions: . sodium chloride 75 mL/hr at 02/05/14 1506    Principal Problem:   Duodenal ulcer perforation Active Problems:   HIV (human immunodeficiency virus infection)   Perforated duodenal ulcer   Time spent 15 minutes

## 2014-02-06 DIAGNOSIS — I10 Essential (primary) hypertension: Secondary | ICD-10-CM

## 2014-02-06 DIAGNOSIS — E440 Moderate protein-calorie malnutrition: Secondary | ICD-10-CM

## 2014-02-06 DIAGNOSIS — K275 Chronic or unspecified peptic ulcer, site unspecified, with perforation: Secondary | ICD-10-CM | POA: Insufficient documentation

## 2014-02-06 MED ORDER — OMEPRAZOLE MAGNESIUM 20 MG PO TBEC: 20.0000 mg | DELAYED_RELEASE_TABLET | Freq: Two times a day (BID) | ORAL | Status: DC

## 2014-02-06 NOTE — Discharge Summary (Signed)
Discharge Summary  Jose Lamb UVO:[id number] DOB: 1960/11/19  PCP: PROVIDER NOT IN SYSTEM  Admit date: 02/04/2014 Discharge date: 02/06/2014  Time spent: 25 minutes  Recommendations for Outpatient Follow-up:  1. New medication: Prilosec 20 g by mouth twice a day 2. Patient will follow-up with [facility] gastroenterology in the next 2 weeks for possible EGD   Discharge Diagnoses:  Active Hospital Problems   Diagnosis Date Noted  . Duodenal ulcer perforation 02/04/2014  . Hypertension 02/06/2014  . Moderate malnutrition 02/06/2014  . HIV (human immunodeficiency virus infection) 02/04/2014    Resolved Hospital Problems   Diagnosis Date Noted Date Resolved  No resolved problems to display.    Discharge Condition: Improved, being discharged home  Diet recommendation: Low-sodium  Filed Weights   02/04/14 0740 02/04/14 1319  Weight: 91.627 kg (202 lb) 91.627 kg (202 lb)    History of present illness:  Patient is a 54 year old male with past history of hypertension and well-controlled HIV who presented with complaints of several days of sudden onset acute upper abdominal pain. In the emergency room CT of the abdomen and a localized duodenal ulcer perforation without abscess or free air. Patient was admitted to the hospitalist service on 12/31 with bowel rest.   Hospital Course:  Principal Problem:   Duodenal ulcer perforation: General surgery recommended conservative measures and deferring to GI. Patient's diet was slowly advanced and he tolerated clear liquids. By following day, 1/1, patient was advanced to full liquids which he tolerated. He was then advanced to low sodium solid food which he tolerated well and patient was discharged home. GI recommended follow-up in the next 2 weeks with them for possible EGD.  Active Problems:   HIV (human immunodeficiency virus infection): Patient continued on retrovirals    Hypertension: Patient will resume his home meds upon  discharge    Moderate malnutrition: Seen by nutrition. Patient meets criteria for moderate malnutrition the context of acute illness. Started on resource breeze by mouth 3 times a day and ProStat 3 times a day with plans that his malnutrition will resolve once he is able to take full by mouth.   Procedures:  None  Consultations:  General surgery  Gastroenterology  Discharge Exam: BP 126/73 mmHg  Pulse 83  Temp(Src) 98.4 F (36.9 C) (Oral)  Resp 18  Ht 5\' 11"  (1.803 m)  Wt 91.627 kg (202 lb)  BMI 28.19 kg/m2  SpO2 95%  General: Alert and oriented 3, no acute distress Cardiovascular: Regular rate and rhythm, S1-S2 Respiratory: Clear to auscultation bilaterally  Discharge Instructions You were cared for by a hospitalist during your hospital stay. If you have any questions about your discharge medications or the care you received while you were in the hospital after you are discharged, you can call the unit and asked to speak with the hospitalist on call if the hospitalist that took care of you is not available. Once you are discharged, your primary care physician will handle any further medical issues. Please note that NO REFILLS for any discharge medications will be authorized once you are discharged, as it is imperative that you return to your primary care physician (or establish a relationship with a primary care physician if you do not have one) for your aftercare needs so that they can reassess your need for medications and monitor your lab values.     Medication List    TAKE these medications        acetaminophen 500 MG tablet  Commonly known as:  TYLENOL  Take 500 mg by mouth every 6 (six) hours as needed.     albuterol 108 (90 BASE) MCG/ACT inhaler  Commonly known as:  PROVENTIL HFA;VENTOLIN HFA  Inhale 2 puffs into the lungs every 6 (six) hours as needed for wheezing or shortness of breath.     emtricitabine-tenofovir 200-300 MG per tablet  Commonly known as:   TRUVADA  Take 1 tablet by mouth at bedtime.     gabapentin 300 MG capsule  Commonly known as:  NEURONTIN  Take 300 mg by mouth 3 (three) times daily.     INTELENCE 200 MG Tabs  Generic drug:  Etravirine  Take 1 tablet by mouth at bedtime.     omeprazole 20 MG tablet  Commonly known as:  PRILOSEC OTC  Take 1 tablet (20 mg total) by mouth 2 (two) times daily.     PREZISTA 800 MG tablet  Generic drug:  Darunavir Ethanolate  Take 800 mg by mouth at bedtime.     ritonavir 100 MG capsule  Commonly known as:  NORVIR  Take 100 mg by mouth at bedtime.     triamterene-hydrochlorothiazide 37.5-25 MG per tablet  Commonly known as:  MAXZIDE-25  Take 1 tablet by mouth at bedtime.     valACYclovir 500 MG tablet  Commonly known as:  VALTREX  Take 500 mg by mouth 2 (two) times daily as needed (for  flares).       Allergies  Allergen Reactions  . Asa [Aspirin] Other (See Comments)    Nose bleeds      The results of significant diagnostics from this hospitalization (including imaging, microbiology, ancillary and laboratory) are listed below for reference.    Significant Diagnostic Studies: Dg Chest 2 View  02/04/2014   CLINICAL DATA:  Nausea and left lower quadrant pain  EXAM: CHEST  2 VIEW  COMPARISON:  October 25, 2013  FINDINGS: There is no edema or consolidation. Heart is upper normal in size with pulmonary vascularity within normal limits. No adenopathy. There is upper thoracic levoscoliosis. There is degenerative change in the thoracic spine.  IMPRESSION: No edema or consolidation.   Electronically Signed   By: Lowella Grip M.D.   On: 02/04/2014 08:37   Ct Abdomen Pelvis W Contrast  02/04/2014   CLINICAL DATA:  One week history of left upper quadrant region pain  EXAM: CT ABDOMEN AND PELVIS WITH CONTRAST  TECHNIQUE: Multidetector CT imaging of the abdomen and pelvis was performed using the standard protocol following bolus administration of intravenous contrast. Oral  contrast was also administered.  CONTRAST:  35mL OMNIPAQUE IOHEXOL 300 MG/ML SOLN, 132mL OMNIPAQUE IOHEXOL 300 MG/ML SOLN  COMPARISON:  None.  FINDINGS: There is a 5 mm nodular opacity in the superior segment of the right lower lobe seen on axial slice 2 series 8. There is mild scarring in the left base. There is mild lower lobe bronchiectatic change bilaterally.  Liver is prominent, measuring 19.1 cm in length. No focal liver lesions are identified. Gallbladder wall is not thickened. There is no appreciable biliary duct dilatation.  Spleen, pancreas, and right adrenal appear normal. There is a nodular lesion in the left adrenal measuring 1.0 x 0.8 cm.  . No other mass lesions are identified. There is no hydronephrosis on either side. There is no renal or ureteral calculus on either side.  In the pelvis, the urinary bladder is midline with normal wall thickness. There are several small prostatic calculi. There is no pelvic mass or fluid  collection. There is fat in each inguinal ring.  Appendix appears normal.  There is a small ventral hernia containing only fat.  There is no bowel obstruction. No free air or portal venous air. There is stranding in the mesenteric a adjacent to the the first portion of the duodenum. A small focus of oral contrast extends into this area of inflammation medially, consistent with a small ulcer perforation. This finding is best appreciated on coronal slice 23 series 5 but is also appreciable on sagittal slice 48 series 6 and axial slice 26 series 3.  There is no ascites, adenopathy, or abscess in the abdomen or pelvis. There is atherosclerotic change in the aorta and iliac arteries but no aneurysm. There is degenerative change in the lumbar spine. There are no blastic or lytic bone lesions.  IMPRESSION: Evidence of oral contrast extending into an area of inflammation just medial to the first portion of the duodenum consistent with a localized ulcer perforation, contained. There is no  abscess or free air in this area. This finding may well warrant direct visualization for further assessment.  No bowel obstruction.  Appendix appears normal.  No renal or ureteral calculus.  No hydronephrosis.  Several prostatic calculi are noted.  Small left adrenal mass. This finding may warrant a followup study in 6-12 months to assess for stability.  Critical Value/emergent results were called by telephone at the time of interpretation on 02/04/2014 at 9:50 am to Dr. Dorie Rank , who verbally acknowledged these results.   Electronically Signed   By: Lowella Grip M.D.   On: 02/04/2014 09:51    Microbiology: No results found for this or any previous visit (from the past 240 hour(s)).   Labs: Basic Metabolic Panel:  Recent Labs Lab 02/04/14 0754  NA 137  K 3.5  CL 106  CO2 27  GLUCOSE 159*  BUN 12  CREATININE 0.55  CALCIUM 9.3   Liver Function Tests:  Recent Labs Lab 02/04/14 0754  AST 31  ALT 32  ALKPHOS 163*  BILITOT 0.7  PROT 6.7  ALBUMIN 3.5    Recent Labs Lab 02/04/14 0754  LIPASE 31   No results for input(s): AMMONIA in the last 168 hours. CBC:  Recent Labs Lab 02/04/14 0754  WBC 3.3*  NEUTROABS 1.3*  HGB 13.0  HCT 39.7  MCV 84.3  PLT 133*   Cardiac Enzymes: No results for input(s): CKTOTAL, CKMB, CKMBINDEX, TROPONINI in the last 168 hours. BNP: BNP (last 3 results) No results for input(s): PROBNP in the last 8760 hours. CBG: No results for input(s): GLUCAP in the last 168 hours.     Signed:  Annita Brod  [facility] 02/06/2014, 1:53 PM

## 2014-02-06 NOTE — Progress Notes (Signed)
Patient feels well. Tolerating a full liquid diet without difficulty.  vital signs in last 24 hours: Temp:  [98.4 F (36.9 C)-99.3 F (37.4 C)] 98.4 F (36.9 C) (01/01 0654) Pulse Rate:  [80-93] 83 (01/01 0654) Resp:  [18] 18 (01/01 0654) BP: (122-134)/(72-78) 126/73 mmHg (01/01 0654) SpO2:  [93 %-96 %] 95 % (01/01 0654) Last BM Date: 02/06/14 General:   Alert,  Well-developed, well-nourished, pleasant and cooperative in NAD.  Accompanied by multiple family members. Abdomen:  Nondistended. Positive bowel sounds soft and nontender without mass or organomegaly. Extremities:  Without clubbing or edema.    Intake/Output from previous day: 12/31 0701 - 01/01 0700 In: 1143 [P.O.:240; I.V.:903] Out: 2302 [Urine:2300; Stool:2] Intake/Output this shift:    Lab Results:  Recent Labs  02/04/14 0754  WBC 3.3*  HGB 13.0  HCT 39.7  PLT 133*   BMET  Recent Labs  02/04/14 0754  NA 137  K 3.5  CL 106  CO2 27  GLUCOSE 159*  BUN 12  CREATININE 0.55  CALCIUM 9.3   LFT  Recent Labs  02/04/14 0754  PROT 6.7  ALBUMIN 3.5  AST 31  ALT 32  ALKPHOS 163*  BILITOT 0.7   PT/INR  Recent Labs  02/04/14 0754  LABPROT 99.3  INR 7.16   Helicobacter pylori serologies negative  Impression:  Pleasant 54 year old gentleman with HIV admitted with most likely a duodenal ulcer, complicated by a  contained perforation. Clinically, doing well and tolerating advancement of diet.  H. pylori serologies are negative. No significant NSAID history. Even though this most likely is a duodenal ulcer, given no risk factors, would consider an EGD  - electively at a later date to evaluate his upper GI tract.  Recommendations:  I'm okay with discharge within the next 24 hours per hospitalist attending. Would discharge on Protonix 40 mg twice daily. Would absolutely avoid all forms of nonsteroidals going forward. I'll make arrangements to see him back in the office in about 6 weeks.

## 2014-02-06 NOTE — Progress Notes (Signed)
Patient d/c home with family left floor via wheelchair accompanied by staff no c/o pain at d/c Discharge instructions reviewed with patient verbalized understanding  Delayne Sanzo, Tivis Ringer

## 2014-02-09 NOTE — Telephone Encounter (Signed)
PUT IN URGENT OV 02/20/14, SPOKE TO PATIENT WIFE AND SHE IS AWARE OF DATE AND TIME

## 2014-02-19 ENCOUNTER — Other Ambulatory Visit: Payer: Self-pay

## 2014-02-19 ENCOUNTER — Encounter: Payer: Self-pay | Admitting: Nurse Practitioner

## 2014-02-19 ENCOUNTER — Ambulatory Visit (INDEPENDENT_AMBULATORY_CARE_PROVIDER_SITE_OTHER): Payer: Medicare Other | Admitting: Nurse Practitioner

## 2014-02-19 VITALS — BP 136/72 | HR 91 | Temp 97.4°F | Ht 71.0 in | Wt 202.0 lb

## 2014-02-19 DIAGNOSIS — K265 Chronic or unspecified duodenal ulcer with perforation: Secondary | ICD-10-CM

## 2014-02-19 NOTE — Patient Instructions (Signed)
1. We will schedule your procedure with Dr. Gala Romney in about 4 weeks 2. Continue taking your acid blocker 3. Continue with your diet changes. 4. Further recommendations based on the results of the endoscopy 5. Call us if you have any problems.

## 2014-02-19 NOTE — Assessment & Plan Note (Signed)
Hospital admission for contained perforated duodenal ulcer, no surgery needed, progressed well as inpatient. Currently doing well, has made diet changes and continues on bid PPI. No abdominal pain, N/V/D. Ome constipation in the past couple days due to decreased water intake. Discussed increasing his water intake and let us know if the constipation continues. As per plan in hospital will plan for EGD in about 4 weeks to reassess the ulcer especially as there was no identified major trigger for his ulcer and perforation.  Proceed with EGD with Dr. Gala Romney in near future: the risks, benefits, and alternatives have been discussed with the patient in detail. The patient states understanding and desires to proceed.

## 2014-02-19 NOTE — Progress Notes (Signed)
Primary Care Physician:  PROVIDER NOT [facility] Primary Gastroenterologist:  Dr. Gala Romney  Chief Complaint  Patient presents with  . EGD    HPI:   54 year old male presents for hospital follow-up of contained duodenal ulcer perforation. Had been having LUQ discomfort since Christmas with mild pain which worsened until he presented to the ER. Pain at that time was described as a deep ppain associated with N/V. Denies history of NSAID and ASA powder use. Reported a remote history of ulcers in his 31s without endoscopic examination. Previous colonoscopy approximately 2 months ago in [place]. His duodenal ulcer perforation was contained and he progressed well in the hospital with bid PPI, antibiotics, and supportive measures.  Since his hospitalization he states he's been doing quite well. First couple days had some residual pain but changed his eating habits to cut out heavy and spicy foods. Has also been taking hit PPI therapy of Omeprazole 20mg  bid. He has not had any recurrent abdominal pain. Denies N/V/D. Typically has a bowel movement 3-4 times a day which is usually a 4 on the [facility] Stool Scale. In the past 2-3 days is going less frequently and stool is about a 1-2 on the stool scale. He attributes this to recent decreased water intake. Denies hematochezia and melena. Denies dysphagia. Is strictly avoiding all NSAIDs and ASA powders. Denies any other upper or lower GI problems.  Past Medical History  Diagnosis Date  . Hypertension   . HIV antibody positive   . Perforated duodenal ulcer 2015    contained, no surg, spontaneous resolution    Past Surgical History  Procedure Laterality Date  . None    . Colonoscopy  Sept 2015    [place]: transverse colon polyp with pathology reporting lymphoid nodule     Current Outpatient Prescriptions  Medication Sig Dispense Refill  . albuterol (PROVENTIL HFA;VENTOLIN HFA) 108 (90 BASE) MCG/ACT inhaler Inhale 2 puffs into the lungs every  6 (six) hours as needed for wheezing or shortness of breath.    . Darunavir Ethanolate (PREZISTA) 800 MG tablet Take 800 mg by mouth at bedtime.     [name] emtricitabine-tenofovir (TRUVADA) 200-300 MG per tablet Take 1 tablet by mouth at bedtime.     . Etravirine (INTELENCE) 200 MG TABS Take 1 tablet by mouth at bedtime.     . gabapentin (NEURONTIN) 300 MG capsule Take 300 mg by mouth 3 (three) times daily.    [name] omeprazole (PRILOSEC OTC) 20 MG tablet Take 1 tablet (20 mg total) by mouth 2 (two) times daily. 60 tablet 1  . ritonavir (NORVIR) 100 MG capsule Take 100 mg by mouth at bedtime.     . triamterene-hydrochlorothiazide (MAXZIDE-25) 37.5-25 MG per tablet Take 1 tablet by mouth at bedtime.     . valACYclovir (VALTREX) 500 MG tablet Take 500 mg by mouth 2 (two) times daily as needed (for  flares).     [name] acetaminophen (TYLENOL) 500 MG tablet Take 500 mg by mouth every 6 (six) hours as needed.     No current facility-administered medications for this visit.    Allergies as of 02/19/2014 - Review Complete 02/19/2014  Allergen Reaction Noted  . Asa [aspirin] Other (See Comments) 12/15/2012    Family History  Problem Relation Age of Onset  . Colon cancer Neg Hx   . Ulcers Mother     History   Social History  . Marital Status: Married    Spouse Name: N/A  Number of Children: N/A  . Years of Education: N/A   Occupational History  . Not on file.   Social History Main Topics  . Smoking status: Never Smoker   . Smokeless tobacco: Not on file  . Alcohol Use: No  . Drug Use: No  . Sexual Activity: Yes   Other Topics Concern  . Not on file   Social History Narrative    Review of Systems: Gen: Denies any fever, chills, fatigue, weight loss, lack of appetite.  CV: Denies chest pain, heart palpitations, peripheral edema, syncope.  Resp: Denies shortness of breath at rest or with exertion. Denies wheezing or cough.  GI: Denies dysphagia or odynophagia. Denies jaundice,  hematemesis, fecal incontinence. GU : Denies urinary burning, urinary frequency, urinary hesitancy MS: Denies joint pain, muscle weakness, cramps, or limitation of movement.  Derm: Denies rash, itching, dry skin Psych: Denies depression, anxiety, memory loss, and confusion Heme: Denies bruising, bleeding, and enlarged lymph nodes.  Physical Exam: BP 136/72 mmHg  Pulse 91  Temp(Src) 97.4 F (36.3 C) (Oral)  Ht 5\' 11"  (1.803 m)  Wt 202 lb (91.627 kg)  BMI 28.19 kg/m2 General:   Alert and oriented. Pleasant and cooperative. Well-nourished and well-developed.  Head:  Normocephalic and atraumatic. Mouth:  No deformity or lesions, oral mucosa pink.  No OP edema Neck:  Supple, without mass or thyromegaly. Lungs:  Clear to auscultation bilaterally. No wheezes, rales, or rhonchi. No distress.  Heart:  S1, S2 present without murmurs appreciated.  Abdomen:  +BS, soft, non-tender and non-distended. No HSM noted. No guarding or rebound. No masses appreciated.  Rectal:  Deferred  Msk:  Symmetrical without gross deformities. Normal posture. Pulses:  Normal DP pulses noted. Extremities:  Without clubbing or edema. Neurologic:  Alert and  oriented x4;  grossly normal neurologically. Skin:  Intact without significant lesions or rashes. Psych:  Alert and cooperative. Normal mood and affect.     02/19/2014 9:18 AM

## 2014-02-20 ENCOUNTER — Ambulatory Visit: Payer: Medicare Other | Admitting: Nurse Practitioner

## 2014-02-23 NOTE — Progress Notes (Signed)
No pcp per patient 

## 2014-03-13 NOTE — OR Nursing (Signed)
Called patient for pre procedure call and patient stated that he needs to reschedule because he is out of town. Office to be notified.

## 2014-03-16 ENCOUNTER — Encounter (HOSPITAL_COMMUNITY): Admission: RE | Payer: Self-pay | Source: Ambulatory Visit

## 2014-03-16 ENCOUNTER — Ambulatory Visit (HOSPITAL_COMMUNITY): Admission: RE | Admit: 2014-03-16 | Payer: Medicare Other | Source: Ambulatory Visit | Admitting: Internal Medicine

## 2014-03-16 ENCOUNTER — Telehealth: Payer: Self-pay

## 2014-03-16 SURGERY — EGD (ESOPHAGOGASTRODUODENOSCOPY)
Anesthesia: Moderate Sedation

## 2014-03-16 NOTE — Telephone Encounter (Signed)
Melanie from [place] unit called and said that pt is out of town and will not be coming for his procedure today. Called pts home and cell phone to leave a message to call back to arrange an office visit and to reschedule procedures. Unable to leave messages no answer. Called multiple times.

## 2014-03-18 ENCOUNTER — Other Ambulatory Visit: Payer: Self-pay

## 2014-03-18 DIAGNOSIS — K269 Duodenal ulcer, unspecified as acute or chronic, without hemorrhage or perforation: Secondary | ICD-10-CM

## 2014-03-18 NOTE — Telephone Encounter (Signed)
Pt came in the office and has been rescheduled for 03/19/2014 with RMR

## 2014-03-19 ENCOUNTER — Encounter (HOSPITAL_COMMUNITY): Payer: Self-pay | Admitting: *Deleted

## 2014-03-19 ENCOUNTER — Other Ambulatory Visit: Payer: Self-pay

## 2014-03-19 ENCOUNTER — Encounter (HOSPITAL_COMMUNITY)
Admission: RE | Disposition: A | Discharge status: Home / Self Care | Payer: Self-pay | Source: Ambulatory Visit | Attending: Emergency Medicine

## 2014-03-19 ENCOUNTER — Ambulatory Visit (HOSPITAL_COMMUNITY)
Admission: RE | Admit: 2014-03-19 | Discharge: 2014-03-19 | Disposition: A | Discharge status: Home / Self Care | Payer: Medicare Other | Source: Ambulatory Visit | Attending: Emergency Medicine | Admitting: Emergency Medicine

## 2014-03-19 ENCOUNTER — Ambulatory Visit (HOSPITAL_COMMUNITY): Payer: Medicare Other

## 2014-03-19 DIAGNOSIS — Z21 Asymptomatic human immunodeficiency virus [HIV] infection status: Secondary | ICD-10-CM | POA: Diagnosis not present

## 2014-03-19 DIAGNOSIS — Z5309 Procedure and treatment not carried out because of other contraindication: Secondary | ICD-10-CM | POA: Diagnosis not present

## 2014-03-19 DIAGNOSIS — R0602 Shortness of breath: Secondary | ICD-10-CM | POA: Insufficient documentation

## 2014-03-19 DIAGNOSIS — I10 Essential (primary) hypertension: Secondary | ICD-10-CM | POA: Diagnosis not present

## 2014-03-19 DIAGNOSIS — I517 Cardiomegaly: Secondary | ICD-10-CM | POA: Diagnosis not present

## 2014-03-19 DIAGNOSIS — K269 Duodenal ulcer, unspecified as acute or chronic, without hemorrhage or perforation: Secondary | ICD-10-CM | POA: Insufficient documentation

## 2014-03-19 DIAGNOSIS — R0600 Dyspnea, unspecified: Secondary | ICD-10-CM

## 2014-03-19 DIAGNOSIS — Z886 Allergy status to analgesic agent status: Secondary | ICD-10-CM | POA: Insufficient documentation

## 2014-03-19 DIAGNOSIS — R000 Tachycardia, unspecified: Secondary | ICD-10-CM | POA: Insufficient documentation

## 2014-03-19 DIAGNOSIS — R002 Palpitations: Secondary | ICD-10-CM

## 2014-03-19 LAB — BASIC METABOLIC PANEL
ANION GAP: 7 (ref 5–15)
BUN: 13 mg/dL (ref 6–23)
CO2: 29 mmol/L (ref 19–32)
Calcium: 9.4 mg/dL (ref 8.4–10.5)
Chloride: 105 mmol/L (ref 96–112)
Creatinine, Ser: 0.57 mg/dL (ref 0.50–1.35)
GFR calc non Af Amer: >90 mL/min (ref >90)
Glucose, Bld: 102 mg/dL — ABNORMAL HIGH (ref 70–99)
POTASSIUM: 4 mmol/L (ref 3.5–5.1)
Sodium: 141 mmol/L (ref 135–145)

## 2014-03-19 LAB — CBC WITH DIFFERENTIAL/PLATELET
Basophils Absolute: 0 10*3/uL (ref 0.0–0.1)
Basophils Relative: 0 % (ref 0–1)
Eosinophils Absolute: 0.1 10*3/uL (ref 0.0–0.7)
Eosinophils Relative: 2 % (ref 0–5)
HCT: 39.2 % (ref 39.0–52.0)
Hemoglobin: 12.7 g/dL — ABNORMAL LOW (ref 13.0–17.0)
LYMPHS ABS: 1.7 10*3/uL (ref 0.7–4.0)
LYMPHS PCT: 31 % (ref 12–46)
MCH: 27.5 pg (ref 26.0–34.0)
MCHC: 32.4 g/dL (ref 30.0–36.0)
MCV: 85 fL (ref 78.0–100.0)
Monocytes Absolute: 1.1 10*3/uL — ABNORMAL HIGH (ref 0.1–1.0)
Monocytes Relative: 19 % — ABNORMAL HIGH (ref 3–12)
NEUTROS PCT: 48 % (ref 43–77)
Neutro Abs: 2.6 10*3/uL (ref 1.7–7.7)
PLATELETS: 135 10*3/uL — AB (ref 150–400)
RBC: 4.61 MIL/uL (ref 4.22–5.81)
RDW: 13.5 % (ref 11.5–15.5)
WBC: 5.5 10*3/uL (ref 4.0–10.5)

## 2014-03-19 LAB — MAGNESIUM: Magnesium: 1.8 mg/dL (ref 1.5–2.5)

## 2014-03-19 LAB — TSH: TSH: <0.01 u[IU]/mL — ABNORMAL LOW (ref 0.350–4.500)

## 2014-03-19 LAB — TROPONIN I: Troponin I: <0.03 ng/mL (ref <0.031)

## 2014-03-19 SURGERY — CANCELLED PROCEDURE

## 2014-03-19 MED ORDER — SODIUM CHLORIDE 0.9 % IV SOLN
INTRAVENOUS | Status: DC
  Administered 2014-03-19: 1000 mL via INTRAVENOUS

## 2014-03-19 NOTE — Discharge Instructions (Signed)
Palpitations A palpitation is the feeling that your heartbeat is irregular or is faster than normal. It may feel like your heart is fluttering or skipping a beat. Palpitations are usually not a serious problem. However, in some cases, you may need further medical evaluation. CAUSES  Palpitations can be caused by:  Smoking.  Caffeine or other stimulants, such as diet pills or energy drinks.  Alcohol.  Stress and anxiety.  Strenuous physical activity.  Fatigue.  Certain medicines.  Heart disease, especially if you have a history of irregular heart rhythms (arrhythmias), such as atrial fibrillation, atrial flutter, or supraventricular tachycardia.  An improperly working pacemaker or defibrillator. DIAGNOSIS  To find the cause of your palpitations, your health care provider will take your medical history and perform a physical exam. Your health care provider may also have you take a test called an ambulatory electrocardiogram (ECG). An ECG records your heartbeat patterns over a 24-hour period. You may also have other tests, such as:  Transthoracic echocardiogram (TTE). During echocardiography, sound waves are used to evaluate how blood flows through your heart.  Transesophageal echocardiogram (TEE).  Cardiac monitoring. This allows your health care provider to monitor your heart rate and rhythm in real time.  Holter monitor. This is a portable device that records your heartbeat and can help diagnose heart arrhythmias. It allows your health care provider to track your heart activity for several days, if needed.  Stress tests by exercise or by giving medicine that makes the heart beat faster. TREATMENT  Treatment of palpitations depends on the cause of your symptoms and can vary greatly. Most cases of palpitations do not require any treatment other than time, relaxation, and monitoring your symptoms. Other causes, such as atrial fibrillation, atrial flutter, or supraventricular  tachycardia, usually require further treatment. HOME CARE INSTRUCTIONS   Avoid:  Caffeinated coffee, tea, soft drinks, diet pills, and energy drinks.  Chocolate.  Alcohol.  Stop smoking if you smoke.  Reduce your stress and anxiety. Things that can help you relax include:  A method of controlling things in your body, such as your heartbeats, with your mind (biofeedback).  Yoga.  Meditation.  Physical activity such as swimming, jogging, or walking.  Get plenty of rest and sleep. SEEK MEDICAL CARE IF:   You continue to have a fast or irregular heartbeat beyond 24 hours.  Your palpitations occur more often. SEEK IMMEDIATE MEDICAL CARE IF:  You have chest pain or shortness of breath.  You have a severe headache.  You feel dizzy or you faint. MAKE SURE YOU:  Understand these instructions.  Will watch your condition.  Will get help right away if you are not doing well or get worse. Document Released: 01/21/2000 Document Revised: 01/28/2013 Document Reviewed: 03/24/2011 [facility] Patient Information 2015 [place], [place]. This information is not intended to replace advice given to you by your health care provider. Make sure you discuss any questions you have with your health care provider.  Holter Monitoring A Holter monitor is a small device with electrodes (small sticky patches) that attach to your chest. It records the electrical activity of your heart and is worn continuously for 24-48 hours.  A HOLTER MONITOR IS USED TO  Detect heart problems such as:  Heart arrhythmia. Is an abnormal or irregular heartbeat. With some heart arrhythmias, you may not feel or know that you have an irregular heart rhythm.  Palpitations, such as feeling your heart racing or fluttering. It is possible to have heart palpitations and not have  a heart arrhythmia.  A heart rhythm that is too slow or too fast.  If you have problems fainting, near fainting or feeling light-headed, a Holter  monitor may be worn to see if your heart is the cause. HOLTER MONITOR PREPARATION   Electrodes will be attached to the skin on your chest.  If you have hair on your chest, small areas may have to be shaved. This is done to help the patches stick better and make the recording more accurate.  The electrodes are attached by wires to the Holter monitor. The Holter monitor clips to your clothing. You will wear the monitor at all times, even while exercising and sleeping. HOME CARE INSTRUCTIONS   Wear your monitor at all times.  The wires and the monitor must stay dry. Do not get the monitor wet.  Do not bathe, swim or use a hot tub with it on.  You may do a "sponge" bath while you have the monitor on.  Keep your skin clean, do not put body lotion or moisturizer on your chest.  It's possible that your skin under the electrodes could become irritated. To keep this from happening, you may put the electrodes in slightly different places on your chest.  Your caregiver will also ask you to keep a diary of your activities, such as walking or doing chores. Be sure to note what you are doing if you experience heart symptoms such as palpitations. This will help your caregiver determine what might be contributing to your symptoms. The information stored in your monitor will be reviewed by your caregiver alongside your diary entries.  Make sure the monitor is safely clipped to your clothing or in a location close to your body that your caregiver recommends.  The monitor and electrodes are removed when the test is over. Return the monitor as directed.  Be sure to follow up with your caregiver and discuss your Holter monitor results. SEEK IMMEDIATE MEDICAL CARE IF:  You faint or feel lightheaded.  You have trouble breathing.  You get pain in your chest, upper arm or jaw.  You feel sick to your stomach and your skin is pale, cool, or damp.  You think something is wrong with the way your heart is  beating. MAKE SURE YOU:   Understand these instructions.  Will watch your condition.  Will get help right away if you are not doing well or get worse. Document Released: 10/22/2003 Document Revised: 04/17/2011 Document Reviewed: 03/05/2008 [facility] Patient Information 2015 [place], [place]. This information is not intended to replace advice given to you by your health care provider. Make sure you discuss any questions you have with your health care provider.

## 2014-03-19 NOTE — ED Notes (Signed)
Pt was in endo to undegor EGD, after hooking pt to cardiac monitor nurse noticed what appear to be sinus tachycardiac rate 150s which last just a few seconds, was unable to capture 12 lead. Pt states he was feeling normal at the time. Pt admits to history of feeling "heart racing", palpitations and SOB which typically last around 5 minutes per episodes, admits to around one episode monthly. Seen by Cardiologist at [facility] under went treadmill stress test at the time which were normal.

## 2014-03-19 NOTE — ED Provider Notes (Signed)
This chart was scribed for [place], DO by Stephania Fragmin, ED Scribe. This patient was seen in room APA02/APA02.   TIME SEEN: 1:49 PM  CHIEF COMPLAINT: Tachycardia   HPI: Jose Lamb is a 54 y.o. male with history of HIV, hypertension who presents to the Emergency Department for tachycardia. Patient was in the hospital this morning about to undergo an endoscopy for recent duodenal ulcer, and reports in the rectum up to the monitor they found his heart rate was in the 150s. This only lasted for several seconds and then resolved prior to nursing staff being able to perform an EKG. Reports that he was asymptomatic during this episode. States that earlier this morning he did have an episode that lasted for approximately 30 seconds where he felt his heart racing and was short of breath. He admits he has occasional episodes of tachycardia accompanied by SOB and weakness, which go away shortly after he sits down. The longest episode he has had so far has been for just a few minutes. Never has chest pain or chest discomfort with this. Patient has lost weight over the past several months, from 205 lbs to 198 lbs, due to the ulcers and regular vomiting. States this has stopped since his hospitalization in January for his duodenal ulcer. Denies any night sweats, fevers, cough, hemoptysis. He reports his last CD4 count was 500, and viral load at around 50. He had a stress test at [facility] last year, which was normal. Does not have a cardiologist. He is followed by infectious disease at [facility]. He denies a history of PE/DVT. He denies any recent surgeries. No recent long flight. No tobacco use. Denies missing any doses of his antiretrovirals. Asymptomatic currently.    ROS: See HPI Constitutional: no fever  Eyes: no drainage  ENT: no runny nose   Cardiovascular:  no chest pain  Resp:  SOB  GI: no vomiting GU: no dysuria Integumentary: no rash  Allergy: no hives  Musculoskeletal: no leg swelling   Neurological: no slurred speech ROS otherwise negative  PAST MEDICAL HISTORY/PAST SURGICAL HISTORY:  Past Medical History  Diagnosis Date  . Hypertension   . HIV antibody positive   . Perforated duodenal ulcer 2015    contained, no surg, spontaneous resolution    MEDICATIONS:  Prior to Admission medications   Medication Sig Start Date End Date Taking? Authorizing Provider  albuterol (PROVENTIL HFA;VENTOLIN HFA) 108 (90 BASE) MCG/ACT inhaler Inhale 2 puffs into the lungs every 6 (six) hours as needed for wheezing or shortness of breath.   Yes Historical Provider, MD  Darunavir Ethanolate (PREZISTA) 800 MG tablet Take 800 mg by mouth at bedtime.    Yes Historical Provider, MD  emtricitabine-tenofovir (TRUVADA) 200-300 MG per tablet Take 1 tablet by mouth at bedtime.    Yes Historical Provider, MD  Etravirine (INTELENCE) 200 MG TABS Take 1 tablet by mouth at bedtime.    Yes Historical Provider, MD  gabapentin (NEURONTIN) 300 MG capsule Take 300 mg by mouth 3 (three) times daily.   Yes Historical Provider, MD  omeprazole (PRILOSEC OTC) 20 MG tablet Take 1 tablet (20 mg total) by mouth 2 (two) times daily. 02/06/14  Yes Annita Brod, MD  ritonavir (NORVIR) 100 MG capsule Take 100 mg by mouth at bedtime.    Yes Historical Provider, MD  triamterene-hydrochlorothiazide (MAXZIDE-25) 37.5-25 MG per tablet Take 1 tablet by mouth at bedtime.    Yes Historical Provider, MD  valACYclovir (VALTREX) 500 MG  tablet Take 500 mg by mouth 2 (two) times daily as needed (for  flares).    Yes Historical Provider, MD    ALLERGIES:  Allergies  Allergen Reactions  . Asa [Aspirin] Other (See Comments)    Nose bleeds    SOCIAL HISTORY:  History  Substance Use Topics  . Smoking status: Never Smoker   . Smokeless tobacco: Not on file  . Alcohol Use: No    FAMILY HISTORY: Family History  Problem Relation Age of Onset  . Colon cancer Neg Hx   . Ulcers Mother     EXAM: BP 130/76 mmHg  Pulse 98   Temp(Src) 97.8 F (36.6 C) (Oral)  Resp 20  Ht 5\' 11"  (1.803 m)  Wt 198 lb (89.812 kg)  BMI 27.63 kg/m2  SpO2 99% CONSTITUTIONAL: Alert and oriented and responds appropriately to questions. Well-appearing; well-nourished HEAD: Normocephalic EYES: Conjunctivae clear, PERRL ENT: normal nose; no rhinorrhea; moist mucous membranes; pharynx without lesions noted NECK: Supple, no meningismus, no LAD  CARD: RRR; S1 and S2 appreciated; no murmurs, no clicks, no rubs, no gallops RESP: Normal chest excursion without splinting or tachypnea; breath sounds clear and equal bilaterally; no wheezes, no rhonchi, no rales, no hypoxia ABD/GI: Normal bowel sounds; non-distended; soft, non-tender, no rebound, no guarding BACK:  The back appears normal and is non-tender to palpation, there is no CVA tenderness EXT: Normal ROM in all joints; non-tender to palpation; normal capillary refill; no cyanosis; no calf tenderness or swelling to lower extremities SKIN: Normal color for age and race; warm NEURO: Moves all extremities equally PSYCH: The patient's mood and manner are appropriate. Grooming and personal hygiene are appropriate.  MEDICAL DECISION MAKING: Patient here with episode of tachycardia with heart rate in the 150s, unclear what his rhythm was. Did feel short of breath earlier this morning with an episode that he had at home but was asymptomatic when he had this episode and the endoscopy suite. Is currently a symptomatically now been hemodynamically stable. Reports he has had these episodes on and off for the past year at least once a month. Has had a stress test at [facility] a year ago that was negative. Denies a history of recent echocardiogram and has never worn a Holter monitor. We'll check basic labs, electrolytes, TSH, chest x-ray, troponin. No risk factors for pulmonary embolus or DVT other than recent hospitalization in January for his duodenal ulcer however these episodes of palpitations were  occurring before that. I have very low suspicion for PE given he is asymptomatic now. Anticipate if workup is negative and patient remains asymptomatic that he can be discharged home with close outpatient cardiology follow-up as he will likely need to wear a Holter monitor.  ED PROGRESS: Patient still asymptomatic and hemodynamically stable with a heart rate in the 90s, sinus rhythm. Labs, chest x-ray unremarkable. He has received a liter of IV fluids. States he is ready for discharge home. Will give outpatient cardiology follow-up information. Discussed with him return precautions. He verbalizes understanding and is comfortable with plan.      EKG Interpretation  Date/Time:  Thursday March 19 2014 13:47:05 EST Ventricular Rate:  98 PR Interval:  145 QRS Duration: 91 QT Interval:  348 QTC Calculation: 444 R Axis:   41 Text Interpretation:  Sinus rhythm Left atrial enlargement Confirmed by WARD,  DO, KRISTEN ([id number]) on 03/19/2014 2:06:54 PM       I personally performed the services described in this documentation, which was scribed in  my presence. The recorded information has been reviewed and is accurate.   Arnett, DO 03/19/14 1513

## 2014-03-19 NOTE — Progress Notes (Addendum)
Pt arrived to endoscopy pre-op, once connected to EKG monitoring pt had a rate of 156 with irregular premature beats, pt complained of short of breath, no chest pain. Dr. Gala Romney notified and EKG ordered and obtained, rate has decreased to 100 with NSR on report. Pt now verbalizes he feels fine. Pt. Also states that he has had events at least once a month with similar signs and symptoms "pounding in my chest and shortness of breath requiring him to sit down and rest". 2 episodes today prior to arrival.Temp. 97.8 Pulse 156 resp. 20 BP 130/76. Sao2 97 %.   Orders received to transfer pt to ER for evaluation, report given to L. Cardwell RN.  Above reported to me while I was in the middle of another procedure. Patient had an elective EGD scheduled today. It has been canceled. Agree with the ED evaluation. Patient reports recurrent symptoms. Significant tachycardia observed by nursing staff but resolved prior to obtaining rhythm strip documentation. Patient may benefit from extended ambulatory cardiac monitoring and whatever else is deemed to be appropriate. Will arrange follow-up with [place] once cardiac evaluation has been completed.

## 2014-03-23 NOTE — Progress Notes (Signed)
Patient ID: Jose Lamb, male   DOB: Sep 17, 1960, 54 y.o.   MRN: [id number]    Cardiology Office Note   Date:  03/23/2014   ID:  Jose Lamb, DOB 1960-08-31, MRN [id number]  PCP:  PROVIDER NOT IN SYSTEM  Cardiologist:   Jenkins Rouge, MD   No chief complaint on file.     History of Present Illness: Jose Lamb is a 54 y.o. male who presents for  Tachycardia.  Seen at [facility] on 2/11  History of HIV, hypertension seen in  Emergency Department for tachycardia. Patient was in the hospital  2/11  about to undergo an endoscopy for recent duodenal ulcer, and when hooke up to the monitor they found his heart rate was in the 150s. This only lasted for several seconds and then resolved prior to nursing staff being able to perform an EKG. Reports that he was asymptomatic during this episode. States that earlier that morning he did have an episode that lasted for approximately 30 seconds where he felt his heart racing and was short of breath. He admits he has occasional episodes of tachycardia accompanied by SOB and weakness, which go away shortly after he sits down. The longest episode he has had so far has been for just a few minutes. Never has chest pain or chest discomfort with this. Patient has lost weight over the past several months, from 205 lbs to 198 lbs, due to the ulcers and regular vomiting. States this has stopped since his hospitalization in January for his duodenal ulcer. Denies any night sweats, fevers, cough, hemoptysis. He reports his last CD4 count was 500, and viral load at around 50. He had a stress test at [facility] last year, which was normal. Does not have a cardiologist. He is followed by infectious disease at [facility]. He denies a history of PE/DVT. He denies any recent surgeries. No recent long flight. No tobacco use. Denies missing any doses of his antiretrovirals. Asymptomatic currently.  Hct 39  Cr normal PLT 136  TSH normal labs reviewed from 2/11   Past Medical History    Diagnosis Date  . Hypertension   . HIV antibody positive   . Perforated duodenal ulcer 2015    contained, no surg, spontaneous resolution    Past Surgical History  Procedure Laterality Date  . None    . Colonoscopy  Sept 2015    [place]: transverse colon polyp with pathology reporting lymphoid nodule      Current Outpatient Prescriptions  Medication Sig Dispense Refill  . albuterol (PROVENTIL HFA;VENTOLIN HFA) 108 (90 BASE) MCG/ACT inhaler Inhale 2 puffs into the lungs every 6 (six) hours as needed for wheezing or shortness of breath.    . Darunavir Ethanolate (PREZISTA) 800 MG tablet Take 800 mg by mouth at bedtime.     [name] emtricitabine-tenofovir (TRUVADA) 200-300 MG per tablet Take 1 tablet by mouth at bedtime.     . Etravirine (INTELENCE) 200 MG TABS Take 1 tablet by mouth at bedtime.     . gabapentin (NEURONTIN) 300 MG capsule Take 300 mg by mouth 3 (three) times daily.    [name] omeprazole (PRILOSEC OTC) 20 MG tablet Take 1 tablet (20 mg total) by mouth 2 (two) times daily. 60 tablet 1  . ritonavir (NORVIR) 100 MG capsule Take 100 mg by mouth at bedtime.     . triamterene-hydrochlorothiazide (MAXZIDE-25) 37.5-25 MG per tablet Take 1 tablet by mouth at bedtime.     . valACYclovir (VALTREX) 500 MG tablet  Take 500 mg by mouth 2 (two) times daily as needed (for  flares).      No current facility-administered medications for this visit.    Allergies:   Asa    Social History:  The patient  reports that he has never smoked. He does not have any smokeless tobacco history on file. He reports that he does not drink alcohol or use illicit drugs.   Family History:  The patient's family history includes Ulcers in his mother. There is no history of Colon cancer.    ROS:  Please see the history of present illness.   Otherwise, review of systems are positive for none.   All other systems are reviewed and negative.    PHYSICAL EXAM: VS:  There were no vitals taken for this visit. , BMI  There is no weight on file to calculate BMI. GEN: Well nourished, well developed, in no acute distress HEENT: normal Neck: no JVD, carotid bruits, or masses Cardiac: RRR; soft SEM  murmurs, rubs, or gallops,no edema  Respiratory:  clear to auscultation bilaterally, normal work of breathing GI: soft, nontender, nondistended, + BS MS: no deformity or atrophy Skin: warm and dry, no rash Neuro:  Strength and sensation are intact Psych: euthymic mood, full affect   EKG:  2/11  SR rate 98 normal    Recent Labs: 02/04/2014: ALT 32 03/19/2014: BUN 13; Creatinine 0.57; Hemoglobin 12.7*; Magnesium 1.8; Platelets 135*; Potassium 4.0; Sodium 141; TSH <0.010*    Lipid Panel No results found for: CHOL, TRIG, HDL, CHOLHDL, VLDL, LDLCALC, LDLDIRECT    Wt Readings from Last 3 Encounters:  03/19/14 89.812 kg (198 lb)  02/19/14 91.627 kg (202 lb)  02/04/14 91.627 kg (202 lb)      Other studies Reviewed: Additional studies/ records that were reviewed today include: Epic ER notes .    ASSESSMENT AND PLAN:  1. Palpitations:  Chronic mostly tachycardia.  Seems adrenergically driven.  Labs ok  Having 3-4 x / week  Will get event monitor.  PRN Inderal for now.  Told him it was ok To have EGD and take an inderal 1 hr before procedure 2. Murmur in setting of palpitations f/u echo  3. HIV:  Good counts stable on retro viral Rx no iv drug use previous promiscuity   Current medicines are reviewed at length with the patient today.  The patient does not have concerns regarding medicines.  The following changes have been made:  Inderal 10 mg PRN  Labs/ tests ordered today include:  Event monitor and Echo  No orders of the defined types were placed in this encounter.     Disposition:   FU with  [facility] office after tests     Signed, Jenkins Rouge, MD  03/23/2014 2:59 PM    [address], [place], [place]  [address] Phone: [phone]; Fax: [phone]

## 2014-03-24 ENCOUNTER — Ambulatory Visit (INDEPENDENT_AMBULATORY_CARE_PROVIDER_SITE_OTHER): Payer: Medicare Other | Admitting: Cardiovascular Disease

## 2014-03-24 ENCOUNTER — Telehealth: Payer: Self-pay

## 2014-03-24 ENCOUNTER — Ambulatory Visit (HOSPITAL_COMMUNITY)
Admission: RE | Admit: 2014-03-24 | Discharge: 2014-03-24 | Disposition: A | Discharge status: Home / Self Care | Payer: Medicare Other | Source: Ambulatory Visit | Attending: Cardiovascular Disease | Admitting: Cardiovascular Disease

## 2014-03-24 ENCOUNTER — Ambulatory Visit (INDEPENDENT_AMBULATORY_CARE_PROVIDER_SITE_OTHER): Payer: Medicare Other | Admitting: Nurse Practitioner

## 2014-03-24 ENCOUNTER — Encounter: Payer: Self-pay | Admitting: Nurse Practitioner

## 2014-03-24 ENCOUNTER — Encounter: Payer: Self-pay | Admitting: Cardiovascular Disease

## 2014-03-24 VITALS — BP 124/71 | HR 89 | Ht 71.0 in | Wt 184.0 lb

## 2014-03-24 VITALS — BP 144/73 | HR 86 | Temp 97.2°F | Ht 71.0 in | Wt 185.2 lb

## 2014-03-24 DIAGNOSIS — I10 Essential (primary) hypertension: Secondary | ICD-10-CM | POA: Diagnosis not present

## 2014-03-24 DIAGNOSIS — R000 Tachycardia, unspecified: Secondary | ICD-10-CM

## 2014-03-24 DIAGNOSIS — R002 Palpitations: Secondary | ICD-10-CM

## 2014-03-24 DIAGNOSIS — E440 Moderate protein-calorie malnutrition: Secondary | ICD-10-CM

## 2014-03-24 DIAGNOSIS — R011 Cardiac murmur, unspecified: Secondary | ICD-10-CM | POA: Insufficient documentation

## 2014-03-24 DIAGNOSIS — K275 Chronic or unspecified peptic ulcer, site unspecified, with perforation: Secondary | ICD-10-CM

## 2014-03-24 DIAGNOSIS — K5900 Constipation, unspecified: Secondary | ICD-10-CM | POA: Insufficient documentation

## 2014-03-24 MED ORDER — PROPRANOLOL HCL 10 MG PO TABS: ORAL_TABLET | ORAL | Status: DC

## 2014-03-24 NOTE — Telephone Encounter (Signed)
Per cardiology patient is cleared for EGD. Recommended Inderal 10 mg 1 hour prior to procedure. See cardiology note for further details. Please contact patient for scheduling and remind him about the Inderal instructions.

## 2014-03-24 NOTE — Patient Instructions (Addendum)
1. We will contact cardiology and ask for their clearance to do the EGD on you.  2. When we get their ok, we will schedule your procedure 3. Call your ID doctor and let them know of your weight status 4. Keep taking your acid blocker. 5. When you're constipated try 17 gm Miralax a day. Stop taking if you swing toward diarrhea

## 2014-03-24 NOTE — Telephone Encounter (Signed)
Yes he is ok to have EGD

## 2014-03-24 NOTE — Patient Instructions (Signed)
Your physician recommends that you schedule a follow-up appointment in: after testing  Your physician has requested that you have an echocardiogram. Echocardiography is a painless test that uses sound waves to create images of your heart. It provides your doctor with information about the size and shape of your heart and how well your heart's chambers and valves are working. This procedure takes approximately one hour. There are no restrictions for this procedure.  Your physician has recommended that you wear an event monitor for 30 days. Event monitors are medical devices that record the heart's electrical activity. Doctors most often [place] these monitors to diagnose arrhythmias. Arrhythmias are problems with the speed or rhythm of the heartbeat. The monitor is a small, portable device. You can wear one while you do your normal daily activities. This is usually used to diagnose what is causing palpitations/syncope (passing out).   Take Inderal 10 mg daily as needed for racing heart or palpitations     Thank you for choosing [facility] !

## 2014-03-24 NOTE — Assessment & Plan Note (Signed)
Occasional constipation which the patient attributes to his medications. Recommend Miralax 17 gm daily when constipation, hold off if normal to loose bowel movements. No red flag/warning signs (overt GI bleed, abdominal pain, etc).

## 2014-03-24 NOTE — Telephone Encounter (Signed)
Mailed letter to pt

## 2014-03-24 NOTE — Progress Notes (Signed)
Patient probably needs extended event monitoring per cardiology.

## 2014-03-24 NOTE — Progress Notes (Signed)
*  PRELIMINARY RESULTS* Echocardiogram 2D Echocardiogram has been performed.  Leavy Cella 03/24/2014, 11:07 AM

## 2014-03-24 NOTE — Progress Notes (Signed)
Referring Provider: No ref. provider found Primary Care Physician:  PROVIDER NOT IN SYSTEM Primary GI: Dr. Gala Romney  Chief Complaint  Patient presents with  . OV prior to scheduling procedure    HPI:   54 year old male presents for office visit prior to EGD. He had an EGD scheduled for 03/19/14 but when he arrived was tachycardic HR=156 with premature beats, shortness of breath. Transferred to the ER for further evaluation but by that time was feeling better and HR=100. States he's previously been worked up at [facility] with a stress test that was negative. Appears to have an appointment with cardiology later today.  Today his heart rate is 86. He states he's feeling fine today. Had another episode Saturday night (a few days ago) which lasted for 30 minutes. Denies abdominal pain, N/V. Bowel movements watery for a few last last week but have returned to normal now. Tends to have a bowel movement daily but feels constipated. States his bowels tend to fluctuate back and forth which he attributes to his current medications. Has not tried Miralax. Denies hematochezia, melena. Denies any more GERD symptoms. States his appetite has been a bit poor lately, since hosptialization, and has lost about 10-15 pounds in the past couple months without intention. Last colonoscopy less than 1 year ago in [place]. Last saw ID for HIV management in December and mentioned his weight loss. Per the patient they said his weight was good but they wanted him to monitor this. States he is drinking a lot of fluids. Denies any other upper or lower GI symptoms.  Past Medical History  Diagnosis Date  . Hypertension   . HIV antibody positive   . Perforated duodenal ulcer 2015    contained, no surg, spontaneous resolution    Past Surgical History  Procedure Laterality Date  . None    . Colonoscopy  Sept 2015    [place]: transverse colon polyp with pathology reporting lymphoid nodule     Current Outpatient  Prescriptions  Medication Sig Dispense Refill  . albuterol (PROVENTIL HFA;VENTOLIN HFA) 108 (90 BASE) MCG/ACT inhaler Inhale 2 puffs into the lungs every 6 (six) hours as needed for wheezing or shortness of breath.    . Darunavir Ethanolate (PREZISTA) 800 MG tablet Take 800 mg by mouth at bedtime.     [name] emtricitabine-tenofovir (TRUVADA) 200-300 MG per tablet Take 1 tablet by mouth at bedtime.     . Etravirine (INTELENCE) 200 MG TABS Take 1 tablet by mouth at bedtime.     . gabapentin (NEURONTIN) 300 MG capsule Take 300 mg by mouth 3 (three) times daily.    [name] omeprazole (PRILOSEC OTC) 20 MG tablet Take 1 tablet (20 mg total) by mouth 2 (two) times daily. 60 tablet 1  . ritonavir (NORVIR) 100 MG capsule Take 100 mg by mouth at bedtime.     . triamterene-hydrochlorothiazide (MAXZIDE-25) 37.5-25 MG per tablet Take 1 tablet by mouth at bedtime.     . valACYclovir (VALTREX) 500 MG tablet Take 500 mg by mouth 2 (two) times daily as needed (for  flares).      No current facility-administered medications for this visit.    Allergies as of 03/24/2014 - Review Complete 03/24/2014  Allergen Reaction Noted  . Asa [aspirin] Other (See Comments) 12/15/2012    Family History  Problem Relation Age of Onset  . Colon cancer Neg Hx   . Ulcers Mother     History   Social History  .  Marital Status: Married    Spouse Name: N/A  . Number of Children: N/A  . Years of Education: N/A   Occupational History  . preacher    Social History Main Topics  . Smoking status: Never Smoker   . Smokeless tobacco: Not on file  . Alcohol Use: No  . Drug Use: No  . Sexual Activity: Yes   Other Topics Concern  . None   Social History Narrative    Review of Systems: Gen: Denies fever, chills, anorexia. Denies fatigue, weakness.  CV: Chest pain and palpitations per HPI, seeing cardiology today. Denies syncope, peripheral edema, and claudication. Resp: Denies dyspnea at rest, wheezing, coughing up blood, and  pleurisy. GI: See HPI. Denies vomiting blood, jaundice, and fecal incontinence.   Denies dysphagia or odynophagia. Derm: Denies rash, itching, dry skin Psych: Denies depression, anxiety, memory loss, confusion. Heme: Denies bruising, bleeding, and enlarged lymph nodes.  Physical Exam: BP 144/73 mmHg  Pulse 86  Temp(Src) 97.2 F (36.2 C)  Ht 5\' 11"  (1.803 m)  Wt 185 lb 3.2 oz (84.006 kg)  BMI 25.84 kg/m2 General:   Alert and oriented. No distress noted. Pleasant and cooperative.  Head:  Normocephalic and atraumatic. Mouth:  Oral mucosa pink and moist. Good dentition. No lesions.No OP edema. Neck:  Supple, without mass or thyromegaly. Lungs:  Clear to auscultation bilaterally. No wheezes, rales, or rhonchi. No distress.  Heart:  S1, S2 present without murmurs, rubs, or gallops. Regular rate and rhythm. Abdomen:  +BS, soft, non-tender and non-distended. No rebound or guarding. No HSM or masses noted. Extremities:  Without edema. Neurologic:  Alert and  oriented x4;  grossly normal neurologically. Skin:  Intact without significant lesions or rashes. Psych:  Alert and cooperative. Normal mood and affect.    03/24/2014 8:21 AM

## 2014-03-24 NOTE — Telephone Encounter (Signed)
-----   Message from [name], MD sent at 03/24/2014  1:51 PM EST ----- Normal echo

## 2014-03-24 NOTE — Assessment & Plan Note (Signed)
Patient with unintentional weight loss. Last colonoscopy in 2015 at [facility] with transverse polyp x 1 (path: lymphoid nodule.) History of HIV and malnutrition. This is likely etiology, HIV/ID team aware of weight loss at appointment in 01/2014, advised patient to call them and update them on his weight for possible further management.

## 2014-03-24 NOTE — Telephone Encounter (Signed)
Routed to Walden Field NP

## 2014-03-24 NOTE — Assessment & Plan Note (Signed)
Last EGD scheduled for 03/19/14 was cancelled due to tachycardia HR 156 with shortness of breath, which the patient states has occurred now and then for him. Was evaluated in the ER and d/c to home that day. Was previously seen in [facility] for stress test which he states was negative. Has an appointment later today with cardiology and will request their clearance before scheduling another EGD appointment.

## 2014-03-24 NOTE — Telephone Encounter (Signed)
Dr. Johnsie Cancel,   This pt was seen in our office today by Walden Field, NP for perforated gastric ulcer. We would like to schedule her for EGD with Dr. Gala Romney in the very near future.  Please advise if she can be cleared for this GI procedure.

## 2014-03-25 ENCOUNTER — Other Ambulatory Visit: Payer: Self-pay

## 2014-03-25 DIAGNOSIS — K265 Chronic or unspecified duodenal ulcer with perforation: Secondary | ICD-10-CM

## 2014-03-25 NOTE — Telephone Encounter (Signed)
He will need stable/normal vital signs as a prerequisite for going forward for this elective procedure. Hopefully, we can reschedule and he will not have any problems.,

## 2014-03-25 NOTE — Telephone Encounter (Signed)
Pt came by office to set up EGD. Pt is set up for 03/30/2014 @ 2:30pm. Pt is aware of cardiology recommendations and will follow them on the day of procedure.

## 2014-03-25 NOTE — Telephone Encounter (Signed)
noted 

## 2014-03-29 ENCOUNTER — Telehealth: Payer: Self-pay | Admitting: Physician Assistant

## 2014-03-29 NOTE — Telephone Encounter (Signed)
     I received a page from [facility] who called to report that Mr Jose Lamb's heart monitor was showing afib with RVR with HRs up to the 200s. They tried calling the patient several times with no success. I tried to call the patient as well (on home and mobile) and also tried calling his wife. Either there was no answer or that number was disconnected.  There was no voice mail to leave a message. I will forward this to his cardiologist, Dr. Johnsie Cancel.   Angelena Form [name]  [facility]

## 2014-03-30 ENCOUNTER — Encounter (HOSPITAL_COMMUNITY): Payer: Self-pay | Admitting: *Deleted

## 2014-03-30 ENCOUNTER — Ambulatory Visit (HOSPITAL_COMMUNITY)
Admission: RE | Admit: 2014-03-30 | Discharge: 2014-03-30 | Disposition: A | Discharge status: Home / Self Care | Payer: Medicare Other | Source: Ambulatory Visit | Attending: Internal Medicine | Admitting: Internal Medicine

## 2014-03-30 ENCOUNTER — Encounter (HOSPITAL_COMMUNITY)
Admission: RE | Disposition: A | Discharge status: Home / Self Care | Payer: Self-pay | Source: Ambulatory Visit | Attending: Internal Medicine

## 2014-03-30 DIAGNOSIS — I10 Essential (primary) hypertension: Secondary | ICD-10-CM | POA: Insufficient documentation

## 2014-03-30 DIAGNOSIS — Z21 Asymptomatic human immunodeficiency virus [HIV] infection status: Secondary | ICD-10-CM | POA: Diagnosis not present

## 2014-03-30 DIAGNOSIS — K449 Diaphragmatic hernia without obstruction or gangrene: Secondary | ICD-10-CM | POA: Diagnosis not present

## 2014-03-30 DIAGNOSIS — Z79899 Other long term (current) drug therapy: Secondary | ICD-10-CM | POA: Insufficient documentation

## 2014-03-30 DIAGNOSIS — K269 Duodenal ulcer, unspecified as acute or chronic, without hemorrhage or perforation: Secondary | ICD-10-CM | POA: Diagnosis not present

## 2014-03-30 DIAGNOSIS — K265 Chronic or unspecified duodenal ulcer with perforation: Secondary | ICD-10-CM | POA: Diagnosis not present

## 2014-03-30 HISTORY — PX: ESOPHAGOGASTRODUODENOSCOPY: SHX5428

## 2014-03-30 SURGERY — EGD (ESOPHAGOGASTRODUODENOSCOPY)
Anesthesia: Moderate Sedation

## 2014-03-30 MED ORDER — MEPERIDINE HCL 100 MG/ML IJ SOLN
INTRAMUSCULAR | Status: AC
  Filled 2014-03-30: qty 2

## 2014-03-30 MED ORDER — LIDOCAINE VISCOUS 2 % MT SOLN
OROMUCOSAL | Status: AC
  Filled 2014-03-30: qty 15

## 2014-03-30 MED ORDER — LIDOCAINE VISCOUS 2 % MT SOLN
OROMUCOSAL | Status: DC | PRN
  Administered 2014-03-30: 4 mL via OROMUCOSAL

## 2014-03-30 MED ORDER — ONDANSETRON HCL 4 MG/2ML IJ SOLN
INTRAMUSCULAR | Status: AC
  Filled 2014-03-30: qty 2

## 2014-03-30 MED ORDER — STERILE WATER FOR IRRIGATION IR SOLN
Status: DC | PRN
  Administered 2014-03-30: 15:00:00

## 2014-03-30 MED ORDER — OMEPRAZOLE MAGNESIUM 20 MG PO TBEC: 20.0000 mg | DELAYED_RELEASE_TABLET | Freq: Every day | ORAL | Status: DC

## 2014-03-30 MED ORDER — SODIUM CHLORIDE 0.9 % IV SOLN
INTRAVENOUS | Status: DC
  Administered 2014-03-30: 13:00:00 via INTRAVENOUS

## 2014-03-30 MED ORDER — ONDANSETRON HCL 4 MG/2ML IJ SOLN
INTRAMUSCULAR | Status: DC | PRN
Start: 2014-03-30 — End: 2014-03-30
  Administered 2014-03-30: 4 mg via INTRAVENOUS

## 2014-03-30 MED ORDER — MEPERIDINE HCL 100 MG/ML IJ SOLN
INTRAMUSCULAR | Status: DC | PRN
  Administered 2014-03-30 (×2): 50 mg via INTRAVENOUS

## 2014-03-30 MED ORDER — MIDAZOLAM HCL 5 MG/5ML IJ SOLN
INTRAMUSCULAR | Status: AC
  Filled 2014-03-30: qty 10

## 2014-03-30 MED ORDER — MIDAZOLAM HCL 5 MG/5ML IJ SOLN
INTRAMUSCULAR | Status: DC | PRN
Start: 2014-03-30 — End: 2014-03-30
  Administered 2014-03-30: 2 mg via INTRAVENOUS
  Administered 2014-03-30: 1 mg via INTRAVENOUS
  Administered 2014-03-30: 2 mg via INTRAVENOUS

## 2014-03-30 NOTE — H&P (View-Only) (Signed)
Pt arrived to endoscopy pre-op, once connected to EKG monitoring pt had a rate of 156 with irregular premature beats, pt complained of short of breath, no chest pain. Dr. Gala Romney notified and EKG ordered and obtained, rate has decreased to 100 with NSR on report. Pt now verbalizes he feels fine. Pt. Also states that he has had events at least once a month with similar signs and symptoms "pounding in my chest and shortness of breath requiring him to sit down and rest". 2 episodes today prior to arrival.Temp. 97.8 Pulse 156 resp. 20 BP 130/76. Sao2 97 %.   Orders received to transfer pt to ER for evaluation, report given to L. Cardwell RN.  Above reported to me while I was in the middle of another procedure. Patient had an elective EGD scheduled today. It has been canceled. Agree with the ED evaluation. Patient reports recurrent symptoms. Significant tachycardia observed by nursing staff but resolved prior to obtaining rhythm strip documentation. Patient may benefit from extended ambulatory cardiac monitoring and whatever else is deemed to be appropriate. Will arrange follow-up with [place] once cardiac evaluation has been completed.

## 2014-03-30 NOTE — Interval H&P Note (Signed)
History and Physical Interval Note:  03/30/2014 2:35 PM  Jose Lamb  has presented today for surgery, with the diagnosis of duodenal ulcer perforation  The various methods of treatment have been discussed with the patient and family. After consideration of risks, benefits and other options for treatment, the patient has consented to  Procedure(s) with comments: ESOPHAGOGASTRODUODENOSCOPY (EGD) (N/A) - 215 as a surgical intervention .  The patient's history has been reviewed, patient examined, no change in status, stable for surgery.  I have reviewed the patient's chart and labs.  Questions were answered to the patient's satisfaction.     Manus Rudd  Event monitor revealed atrial fibrillation VR 200 . Cardiology  Cleared  for an EGD. He did take Inderal just prior to this procedure per plan. His vital signs look great today. EGD per plan today.  The risks, benefits, limitations, alternatives and imponderables have been reviewed with the patient. Potential for esophageal dilation, biopsy, etc. have also been reviewed.  Questions have been answered. All parties agreeable.

## 2014-03-30 NOTE — Progress Notes (Signed)
No pcp per patient 

## 2014-03-30 NOTE — Op Note (Signed)
[facility] [address] [place], [address]   ENDOSCOPY PROCEDURE REPORT  PATIENT: Jose, Lamb  MR#: [id number] BIRTHDATE: 1960-11-16 , 21  yrs. old GENDER: male ENDOSCOPIST: R.  Garfield Cornea, MD FACP FACG REFERRED BY:  Jenkins Rouge, M.D. PROCEDURE DATE:  04-07-2014 PROCEDURE:  EGD, diagnostic INDICATIONS:  Recent complicated duodenal ulcer disease with contained perforation suggested on CT scan. H pylori serologies negative. MEDICATIONS: Versed 5 mg IV and Demerol 100 mg IV in divided doses. Xylocaine gel orally.  Zofran 4 mg IV.   Inderal 10 mg orally one hour prior to this procedure per cardiology recommendations. ASA CLASS:      Class II  CONSENT: The risks, benefits, limitations, alternatives and imponderables have been discussed.  The potential for biopsy, esophogeal dilation, etc. have also been reviewed.  Questions have been answered.  All parties agreeable.  Please see the history and physical in the medical record for more information.  DESCRIPTION OF PROCEDURE: After the risks benefits and alternatives of the procedure were thoroughly explained, informed consent was obtained.  The EQ-6834H ([id number]) endoscope was introduced through the mouth and advanced to the second portion of the duodenum , limited by Without limitations. The instrument was slowly withdrawn as the mucosa was fully examined.    Normal-appearing tubular esophagus Stomach empty.  Small hiatal hernia.  Normal-appearing gastric mucosa.  Patent pylorus. Examination of the first, second and third portion of the duodenum revealed a couple focal areas of lymphangiectasia in the second portion of the duodenum.  Otherwise, there was no scar, stenosis, persisting ulceration, erosion or neoplasm seen in the duodenum. In fact, his proximal duodenum looked much better than I would have expected given the recent inflammatory changes suggested on the CT scan.  Retroflexed views revealed  as previously described.     The scope was then withdrawn from the patient and the procedure completed.  COMPLICATIONS: There were no immediate complications.  ENDOSCOPIC IMPRESSION: Small hiatal hernia. No evidence of residual peptic ulcer disease  RECOMMENDATIONS: May decrease Prilosec to 20 mg orally daily. Avoid all nonsteroidal agents going forward. Keep appointments with his [place].  REPEAT EXAM:  eSigned:  R. Garfield Cornea, MD Rosalita Chessman [facility] 04/07/2014 3:05 PM    CC:  CPT CODES: ICD CODES:  The ICD and CPT codes recommended by this software are interpretations from the data that the clinical staff has captured with the software.  The verification of the translation of this report to the ICD and CPT codes and modifiers is the sole responsibility of the health care institution and practicing physician where this report was generated.  [place]. will not be held responsible for the validity of the ICD and CPT codes included on this report.  AMA assumes no liability for data contained or not contained herein. CPT is a Designer, television/film set of the [employer].  PATIENT NAME:  Jose, Lamb MR#: [id number]

## 2014-03-30 NOTE — Discharge Instructions (Addendum)
EGD Discharge instructions Please read the instructions outlined below and refer to this sheet in the next few weeks. These discharge instructions provide you with general information on caring for yourself after you leave the hospital. Your doctor may also give you specific instructions. While your treatment has been planned according to the most current medical practices available, unavoidable complications occasionally occur. If you have any problems or questions after discharge, please call your doctor. ACTIVITY  You may resume your regular activity but move at a slower pace for the next 24 hours.   Take frequent rest periods for the next 24 hours.   Walking will help expel (get rid of) the air and reduce the bloated feeling in your abdomen.   No driving for 24 hours (because of the anesthesia (medicine) used during the test).   You may shower.   Do not sign any important legal documents or operate any machinery for 24 hours (because of the anesthesia used during the test).  NUTRITION  Drink plenty of fluids.   You may resume your normal diet.   Begin with a light meal and progress to your normal diet.   Avoid alcoholic beverages for 24 hours or as instructed by your caregiver.  MEDICATIONS  You may resume your normal medications unless your caregiver tells you otherwise.  WHAT YOU CAN EXPECT TODAY  You may experience abdominal discomfort such as a feeling of fullness or gas pains.  FOLLOW-UP  Your doctor will discuss the results of your test with you.  SEEK IMMEDIATE MEDICAL ATTENTION IF ANY OF THE FOLLOWING OCCUR:  Excessive nausea (feeling sick to your stomach) and/or vomiting.   Severe abdominal pain and distention (swelling).   Trouble swallowing.   Temperature over 101 F (37.8 C).   Rectal bleeding or vomiting of blood.    No evidence of residual peptic ulcer disease  Avoid nonsteroidal agents  May decrease Prilosec to 20 mg once daily

## 2014-03-31 ENCOUNTER — Telehealth: Payer: Self-pay

## 2014-03-31 ENCOUNTER — Encounter (HOSPITAL_COMMUNITY): Payer: Self-pay | Admitting: Internal Medicine

## 2014-03-31 MED ORDER — CARVEDILOL 3.125 MG PO TABS: 3.1250 mg | ORAL_TABLET | Freq: Two times a day (BID) | ORAL | Status: DC

## 2014-03-31 MED ORDER — FLECAINIDE ACETATE 50 MG PO TABS: 50.0000 mg | ORAL_TABLET | Freq: Two times a day (BID) | ORAL | Status: DC

## 2014-03-31 NOTE — Telephone Encounter (Signed)
Apparently event monitor shows PAF. Have him f/u with [place] MD this week. Echo ok Would have him start coreg 3.125 bid and flecainide 50 bid F/U ETT in 2 weeks after starting flecainide. Consider starting eliquis after EGD         ----- Message -----     From: Eileen Stanford, [name]     Sent: 03/29/2014 11:09 AM      To: [name], MD        Could not get a hold of this patient. Called several times to all numbers listed in his demographics. Heart monitor showing afib with rvr        Spoke with pt, apt made for M lenze [name] tomorrow 04/01/14 at 1:20 pm, meds escribed to [facility]

## 2014-04-01 ENCOUNTER — Ambulatory Visit: Payer: Medicare Other | Admitting: Physician Assistant

## 2014-04-01 DIAGNOSIS — E0500 Thyrotoxicosis with diffuse goiter without thyrotoxic crisis or storm: Secondary | ICD-10-CM | POA: Insufficient documentation

## 2014-04-02 ENCOUNTER — Other Ambulatory Visit: Payer: Self-pay | Admitting: Cardiovascular Disease

## 2014-04-02 ENCOUNTER — Telehealth: Payer: Self-pay | Admitting: *Deleted

## 2014-04-02 NOTE — Telephone Encounter (Addendum)
PT  AWARE   TO CONT WITH  CARVEDILOL  PER PT  UNABLE  TO TAKE  ASA   AS  BLEEDS  WITH  ASA  THERAPY EVEN   WITH BABY . WILL FORWARD   MESSAGE TO SCHEDULER  TO  MAKE  AN APPT  WITH DR Lovena Le IN [place]  OFFICE   RE    APPROPRIATE MED TO   START   WITH   AFIB

## 2014-04-02 NOTE — Telephone Encounter (Signed)
[place], [place] - 04/02/14 >','<< Less Detail',event)" href="javascript:;">More Detail >>   Jose Lamb Hector, MD   Sent: Thu April 02, 2014 1:38 PM    To: Jose Lamb Campbell, LPN        Message     Will review event monitor For now take coreg 3.125 bid and Aspirin CHADVASC Score only 1 so no anticoagulation. F/U with me or Jose Lamb in Jose Lamb Lamb to discuss tikosyn or sotolol Rx        Jose Lamb Lamb  03/24/2014 9:30 AM  Office Visit  MRN:  [id number]   Description: Male DOB: Jun 08, 1960  Provider: [name], MD  Department: [facility]         Vital Signs  Most recent update: 03/24/2014 9:28 AM by Jose Lamb Bailey, LPN    BP Pulse Ht    124/71 mmHg 89 5\' 11"  (1.803 m)    Wt: 184 lb (83.462 kg)    BMI: 25.67 kg/m2    SpO2: 97%    Vitals History       Progress Notes      Jose Lamb Hector, MD at 03/23/2014 2:58 PM     Status: Signed       Expand All Collapse All   Patient ID: Jose Lamb Lamb, male DOB: 03-16-1960, 54 y.o. MRN: [id number]    Cardiology Office Note   Date: 03/23/2014   ID: Jose Lamb Lamb, DOB 04/26/1960, MRN [id number]  PCP: PROVIDER NOT IN SYSTEM Cardiologist: Jose Lamb Rouge, MD   No chief complaint on file.    History of Present Illness: Jose Lamb Lamb is a 54 y.o. male who presents for Tachycardia. Seen at [facility] on 2/11 History of HIV, hypertension seen in Emergency Department for tachycardia. Patient was in the hospital 2/11 about to undergo an endoscopy for recent duodenal ulcer, and when hooke up to the monitor they found his heart rate was in the 150s. This only lasted for several seconds and then resolved prior to nursing staff being able to perform an EKG. Reports that he was asymptomatic during this episode. States that earlier that morning he did have an episode that lasted for approximately 30 seconds where he felt his heart racing and was short of breath. He admits he has occasional episodes of  tachycardia accompanied by SOB and weakness, which go away shortly after he sits down. The longest episode he has had so far has been for just a few minutes. Never has chest pain or chest discomfort with this. Patient has lost weight over the past several months, from 205 lbs to 198 lbs, due to the ulcers and regular vomiting. States this has stopped since his hospitalization in January for his duodenal ulcer. Denies any night sweats, fevers, cough, hemoptysis. He reports his last CD4 count was 500, and viral load at around 50. He had a stress test at [facility] last year, which was normal. Does not have a cardiologist. He is followed by infectious disease at [facility]. He denies a history of PE/DVT. He denies any recent surgeries. No recent long flight. No tobacco use. Denies missing any doses of his antiretrovirals. Asymptomatic currently.  Hct 39 Cr normal PLT 136 TSH normal labs reviewed from 2/11   Past Medical History  Diagnosis Date  . Hypertension   . HIV antibody positive   . Perforated duodenal ulcer 2015    contained, no surg, spontaneous resolution    Past Surgical History  Procedure Laterality Date  . None    . Colonoscopy  Sept 2015  [place]: transverse colon polyp with pathology reporting lymphoid nodule      Current Outpatient Prescriptions  Medication Sig Dispense Refill  . albuterol (PROVENTIL HFA;VENTOLIN HFA) 108 (90 BASE) MCG/ACT inhaler Inhale 2 puffs into the lungs every 6 (six) hours as needed for wheezing or shortness of breath.    . Darunavir Ethanolate (PREZISTA) 800 MG tablet Take 800 mg by mouth at bedtime.     [name] emtricitabine-tenofovir (TRUVADA) 200-300 MG per tablet Take 1 tablet by mouth at bedtime.     . Etravirine (INTELENCE) 200 MG TABS Take 1 tablet by mouth at bedtime.     . gabapentin (NEURONTIN) 300 MG capsule Take 300 mg by mouth 3 (three) times daily.    [name] omeprazole  (PRILOSEC OTC) 20 MG tablet Take 1 tablet (20 mg total) by mouth 2 (two) times daily. 60 tablet 1  . ritonavir (NORVIR) 100 MG capsule Take 100 mg by mouth at bedtime.     . triamterene-hydrochlorothiazide (MAXZIDE-25) 37.5-25 MG per tablet Take 1 tablet by mouth at bedtime.     . valACYclovir (VALTREX) 500 MG tablet Take 500 mg by mouth 2 (two) times daily as needed (for flares).      No current facility-administered medications for this visit.    Allergies: Asa    Social History: The patient  reports that he has never smoked. He does not have any smokeless tobacco history on file. He reports that he does not drink alcohol or use illicit drugs.   Family History: The patient's family history includes Ulcers in his mother. There is no history of Colon cancer.    ROS: Please see the history of present illness. Otherwise, review of systems are positive for none. All other systems are reviewed and negative.    PHYSICAL EXAM: VS: There were no vitals taken for this visit. , BMI There is no weight on file to calculate BMI. GEN: Well nourished, well developed, in no acute distress  HEENT: normal  Neck: no JVD, carotid bruits, or masses Cardiac: RRR; soft SEM murmurs, rubs, or gallops,no edema  Respiratory: clear to auscultation bilaterally, normal work of breathing GI: soft, nontender, nondistended, + BS MS: no deformity or atrophy  Skin: warm and dry, no rash Neuro: Strength and sensation are intact Psych: euthymic mood, full affect   EKG: 2/11 SR rate 98 normal    Recent Labs: 02/04/2014: ALT 32 03/19/2014: BUN 13; Creatinine 0.57; Hemoglobin 12.7*; Magnesium 1.8; Platelets 135*; Potassium 4.0; Sodium 141; TSH <0.010*    Lipid Panel  Labs (Brief)    No results found for: CHOL, TRIG, HDL, CHOLHDL, VLDL, LDLCALC, LDLDIRECT     Wt Readings from Last 3 Encounters:  03/19/14 89.812 kg (198 lb)  02/19/14 91.627 kg (202 lb)    02/04/14 91.627 kg (202 lb)      Other studies Reviewed: Additional studies/ records that were reviewed today include: Epic ER notes .    ASSESSMENT AND PLAN:  1. Palpitations: Chronic mostly tachycardia. Seems adrenergically driven. Labs ok Having 3-4 x / week Will get event monitor. PRN Inderal for now. Told him it was ok To have EGD and take an inderal 1 hr before procedure 2. Murmur in setting of palpitations f/u echo  3. HIV: Good counts stable on retro viral Rx no iv drug use previous promiscuity   Current medicines are reviewed at length with the patient today. The patient does not have concerns regarding medicines.  The following changes have been made: Inderal 10  mg PRN  Labs/ tests ordered today include: Event monitor and Echo  No orders of the defined types were placed in this encounter.    Disposition: FU with [place] office after tests    Signed, Jose Lamb Rouge, MD  03/23/2014 2:59 PM  [address], [place], [place] [address] Phone: [phone]; Fax: [phone]                  Diagnoses     Murmur, cardiac - Primary    ICD-9-CM: 785.2 ICD-10-CM: R01.1    Tachycardia     ICD-9-CM: 785.0 ICD-10-CM: R00.0    Heart palpitations     ICD-9-CM: 785.1 ICD-10-CM: R00.2       Reason for Visit     Reason for Visit History         Ordered Medications       Disp Refills    propranolol (INDERAL) 10 MG tablet 30 tablet 3    Start: 03/24/2014    Take 1 tablet daily as needed for racing heart or palpitations        Level of Service     PR OFFICE OUTPATIENT NEW 45 MINUTES [67124]      Follow-up and Disposition     Follow-up and Disposition History       All Charges for This Encounter     Code Description Service Date    [phone] PR OFFICE OUTPATIENT NEW 45 MINUTES 03/24/2014    Service Provider: [name], MD     Qty: 1    1036F PR CURRENT TOBACCO NON-USER 03/24/2014    Service Provider: [name], MD    Qty: 1        AVS Reports     Date/Time Report Action    03/24/2014 9:45 AM After Visit Summary Printed    User: Bernita Raisin, RN    03/24/2014 9:28 AM After Visit Summary Printed    User: Jose Lamb Bailey, LPN        Patient Instructions     Your physician recommends that you schedule a follow-up appointment in: after testing  Your physician has requested that you have an echocardiogram. Echocardiography is a painless test that uses sound waves to create images of your heart. It provides your doctor with information about the size and shape of your heart and how well your heart's chambers and valves are working. This procedure takes approximately one hour. There are no restrictions for this procedure.  Your physician has recommended that you wear an event monitor for 30 days. Event monitors are medical devices that record the heart's electrical activity. Doctors most often [place] these monitors to diagnose arrhythmias. Arrhythmias are problems with the speed or rhythm of the heartbeat. The monitor is a small, portable device. You can wear one while you do your normal daily activities. This is usually used to diagnose what is causing palpitations/syncope (passing out).   Take Inderal 10 mg daily as needed for racing heart or palpitations     Thank you for choosing [facility] !                Routing History     There are no sent or routed communications associated with this encounter.       Previous Visit       Provider Department    03/24/2014 8:00 AM Jose Lamb Rouge, MD [facility]    Encounter #: [id number]  BUSY  SIGNAL  X1 WILL TRY LATER .Adonis Housekeeper

## 2014-04-02 NOTE — Telephone Encounter (Signed)
TRIED HOME  NUMBER  NO  ANSWER  AND MOBILE NUMBER   CONT  TO GET  BUSY  SIGNAL ./CY

## 2014-04-06 ENCOUNTER — Ambulatory Visit: Payer: Medicare Other | Admitting: Adult Health

## 2014-04-06 NOTE — Progress Notes (Signed)
SEE PHONE NOTE./CY

## 2014-04-09 ENCOUNTER — Encounter: Payer: Medicare Other | Admitting: Adult Health

## 2014-04-09 ENCOUNTER — Encounter: Payer: Self-pay | Admitting: Adult Health

## 2014-04-09 NOTE — Progress Notes (Signed)
Cardiology Office Note   ERROR- No Show

## 2014-04-14 ENCOUNTER — Other Ambulatory Visit: Payer: Self-pay | Admitting: *Deleted

## 2014-04-14 DIAGNOSIS — R002 Palpitations: Secondary | ICD-10-CM

## 2014-04-14 DIAGNOSIS — R000 Tachycardia, unspecified: Secondary | ICD-10-CM

## 2014-04-16 ENCOUNTER — Ambulatory Visit: Payer: Medicare Other | Admitting: Cardiovascular Disease

## 2014-04-16 ENCOUNTER — Encounter: Payer: Self-pay | Admitting: *Deleted

## 2014-04-20 ENCOUNTER — Encounter: Payer: Self-pay | Admitting: Internal Medicine

## 2014-04-30 ENCOUNTER — Emergency Department (HOSPITAL_COMMUNITY)
Admission: EM | Admit: 2014-04-30 | Discharge: 2014-04-30 | Disposition: A | Discharge status: Home / Self Care | Payer: Medicare Other | Attending: Emergency Medicine | Admitting: Emergency Medicine

## 2014-04-30 ENCOUNTER — Encounter (HOSPITAL_COMMUNITY): Payer: Self-pay | Admitting: Emergency Medicine

## 2014-04-30 ENCOUNTER — Emergency Department (HOSPITAL_COMMUNITY): Payer: Medicare Other

## 2014-04-30 DIAGNOSIS — J069 Acute upper respiratory infection, unspecified: Secondary | ICD-10-CM | POA: Diagnosis not present

## 2014-04-30 DIAGNOSIS — J40 Bronchitis, not specified as acute or chronic: Secondary | ICD-10-CM

## 2014-04-30 DIAGNOSIS — I10 Essential (primary) hypertension: Secondary | ICD-10-CM | POA: Insufficient documentation

## 2014-04-30 DIAGNOSIS — R05 Cough: Secondary | ICD-10-CM | POA: Diagnosis present

## 2014-04-30 DIAGNOSIS — R000 Tachycardia, unspecified: Secondary | ICD-10-CM | POA: Diagnosis not present

## 2014-04-30 DIAGNOSIS — Z79899 Other long term (current) drug therapy: Secondary | ICD-10-CM | POA: Insufficient documentation

## 2014-04-30 DIAGNOSIS — Z8719 Personal history of other diseases of the digestive system: Secondary | ICD-10-CM | POA: Insufficient documentation

## 2014-04-30 DIAGNOSIS — J209 Acute bronchitis, unspecified: Secondary | ICD-10-CM | POA: Insufficient documentation

## 2014-04-30 MED ORDER — SULFAMETHOXAZOLE-TRIMETHOPRIM 800-160 MG PO TABS
1.0000 | ORAL_TABLET | Freq: Once | ORAL | Status: AC
  Administered 2014-04-30: 1 via ORAL
  Filled 2014-04-30: qty 1

## 2014-04-30 MED ORDER — HYDROCODONE-HOMATROPINE 5-1.5 MG/5ML PO SYRP: 5.0000 mL | ORAL_SOLUTION | Freq: Four times a day (QID) | ORAL | Status: DC | PRN

## 2014-04-30 MED ORDER — PREDNISONE 10 MG PO TABS: ORAL_TABLET | ORAL | Status: DC

## 2014-04-30 MED ORDER — SULFAMETHOXAZOLE-TRIMETHOPRIM 800-160 MG PO TABS: 1.0000 | ORAL_TABLET | Freq: Two times a day (BID) | ORAL | Status: AC

## 2014-04-30 MED ORDER — PREDNISONE 50 MG PO TABS
60.0000 mg | ORAL_TABLET | Freq: Once | ORAL | Status: AC
  Administered 2014-04-30: 60 mg via ORAL
  Filled 2014-04-30 (×2): qty 1

## 2014-04-30 NOTE — ED Provider Notes (Signed)
CSN: [id number]     Arrival date & time 04/30/14  4765 History   First MD Initiated Contact with Patient 04/30/14 [phone]     Chief Complaint  Patient presents with  . Cough     (Consider location/radiation/quality/duration/timing/severity/associated sxs/prior Treatment) HPI Comments: Jan. CD4 count 520 at [facility].  Patient is a 54 y.o. male presenting with cough. The history is provided by the patient.  Cough Cough characteristics:  Productive Sputum characteristics:  Clear Severity:  Severe Onset quality:  Gradual Duration:  1 week Timing:  Intermittent Progression:  Worsening Chronicity:  Recurrent Smoker: no   Context: sick contacts and weather changes   Relieved by:  Nothing Ineffective treatments:  Beta-agonist inhaler Associated symptoms: chills, sinus congestion and wheezing   Associated symptoms: no fever and no myalgias   Risk factors: no recent travel   Risk factors comment:  Hx of HIV   Past Medical History  Diagnosis Date  . Hypertension   . HIV antibody positive   . Perforated duodenal ulcer 2015    contained, no surg, spontaneous resolution   Past Surgical History  Procedure Laterality Date  . None    . Colonoscopy  Sept 2015    [place]: transverse colon polyp with pathology reporting lymphoid nodule   . Esophagogastroduodenoscopy N/A 03/30/2014    Procedure: ESOPHAGOGASTRODUODENOSCOPY (EGD);  Surgeon: Daneil Dolin, MD;  Location: [facility];  Service: Endoscopy;  Laterality: N/A;  215   Family History  Problem Relation Age of Onset  . Colon cancer Neg Hx   . Ulcers Mother    History  Substance Use Topics  . Smoking status: Never Smoker   . Smokeless tobacco: Never Used  . Alcohol Use: No    Review of Systems  Constitutional: Positive for chills. Negative for fever.  HENT: Positive for congestion.   Respiratory: Positive for cough and wheezing.   Musculoskeletal: Negative for myalgias.  All other systems reviewed and are  negative.     Allergies  Asa  Home Medications   Prior to Admission medications   Medication Sig Start Date End Date Taking? Authorizing Provider  albuterol (PROVENTIL HFA;VENTOLIN HFA) 108 (90 BASE) MCG/ACT inhaler Inhale 2 puffs into the lungs every 6 (six) hours as needed for wheezing or shortness of breath.    Historical Provider, MD  carvedilol (COREG) 3.125 MG tablet Take 1 tablet (3.125 mg total) by mouth 2 (two) times daily. 03/31/14   [name], MD  Darunavir Ethanolate (PREZISTA) 800 MG tablet Take 800 mg by mouth at bedtime.     Historical Provider, MD  emtricitabine-tenofovir (TRUVADA) 200-300 MG per tablet Take 1 tablet by mouth at bedtime.     Historical Provider, MD  Etravirine (INTELENCE) 200 MG TABS Take 1 tablet by mouth at bedtime.     Historical Provider, MD  gabapentin (NEURONTIN) 300 MG capsule Take 300 mg by mouth 3 (three) times daily.    Historical Provider, MD  omeprazole (PRILOSEC OTC) 20 MG tablet Take 1 tablet (20 mg total) by mouth daily. 03/30/14   Daneil Dolin, MD  propranolol (INDERAL) 10 MG tablet Take 1 tablet daily as needed for racing heart or palpitations 03/24/14   [name], MD  ritonavir (NORVIR) 100 MG capsule Take 100 mg by mouth at bedtime.     Historical Provider, MD  triamterene-hydrochlorothiazide (MAXZIDE-25) 37.5-25 MG per tablet Take 1 tablet by mouth at bedtime.     Historical Provider, MD  valACYclovir (VALTREX) 500  MG tablet Take 500 mg by mouth 2 (two) times daily as needed (for  flares).     Historical Provider, MD   BP 136/77 mmHg  Pulse 103  Temp(Src) 98.3 F (36.8 C) (Oral)  Resp 20  Ht 5\' 11"  (1.803 m)  Wt 184 lb (83.462 kg)  BMI 25.67 kg/m2  SpO2 98% Physical Exam  Constitutional: He is oriented to person, place, and time. He appears well-developed and well-nourished.  Non-toxic appearance.  HENT:  Head: Normocephalic.  Right Ear: Tympanic membrane and external ear normal.  Left Ear: Tympanic membrane and  external ear normal.  Eyes: EOM and lids are normal. Pupils are equal, round, and reactive to light.  Neck: Normal range of motion. Neck supple. Carotid bruit is not present.  Cardiovascular: Regular rhythm, normal heart sounds, intact distal pulses and normal pulses.  Tachycardia present.   Pulmonary/Chest: No respiratory distress. He has no rales.  Course breath sounds with occasional rhonchi.  Patient speaks in complete sentences without problem.  Abdominal: Soft. Bowel sounds are normal. There is no tenderness. There is no guarding.  Musculoskeletal: Normal range of motion. He exhibits no edema.  Neg Homan's sign.  Lymphadenopathy:       Head (right side): No submandibular adenopathy present.       Head (left side): No submandibular adenopathy present.    He has no cervical adenopathy.  Neurological: He is alert and oriented to person, place, and time. He has normal strength. No cranial nerve deficit or sensory deficit.  Skin: Skin is warm and dry.  Psychiatric: He has a normal mood and affect. His speech is normal.  Nursing note and vitals reviewed.   ED Course  Procedures (including critical care time) Labs Review Labs Reviewed - No data to display  Imaging Review No results found.   EKG Interpretation None      MDM  Pulse rate is 103, otherwise the vital signs are within normal limits. The pulse oximetry is 98% on room air. The patient speaks in complete sentences without problem. The chest x-ray is negative for pneumonia or acute problems. I have discussed the findings with the patient in terms which he understands. The plan at this time is for the patient to continue his albuterol inhaler, and prednisone, Septra, and Hycodan cough medication. The patient is advised to see his primary physicians at [facility] as soon as possible for follow-up and recheck. The patient is encouraged to return to the emergency department if any emergent changes or problems.     Final diagnoses:  None    **I have reviewed nursing notes, vital signs, and all appropriate lab and imaging results for this patient.Lily Kocher, [name] 04/30/14 [place], MD 05/15/14 [phone]

## 2014-04-30 NOTE — ED Notes (Signed)
Cough for last 7 days.  History of pneumonia x4.  Took inhaler with mild relief, about one hour ago. Productive cough with thick white sputum.

## 2014-04-30 NOTE — ED Notes (Signed)
Pt alert & oriented x4, stable gait. Patient given discharge instructions, paperwork & prescription(s). Patient  instructed to stop at the registration desk to finish any additional paperwork. Patient verbalized understanding. Pt left department w/ no further questions. 

## 2014-04-30 NOTE — Discharge Instructions (Signed)
Your oxygen level is within normal limits. Chest x-ray is negative for pneumonia or acute changes. Suspect you are having an exacerbation of bronchitis and upper respiratory infection. Please continue your albuterol as prescribed. Use prednisone taper as prescribed, and Bactrim 2 times daily with food. Use Hycodan for cough if needed.This medication may cause drowsiness. Please do not drink, drive, or participate in activity that requires concentration while taking this medication. See your primary MD for follow up and recheck.

## 2014-05-08 ENCOUNTER — Encounter: Payer: Self-pay | Admitting: Cardiovascular Disease

## 2014-05-08 ENCOUNTER — Ambulatory Visit: Payer: Medicare Other | Admitting: Cardiovascular Disease

## 2014-05-19 ENCOUNTER — Telehealth: Payer: Self-pay | Admitting: *Deleted

## 2014-05-19 NOTE — Telephone Encounter (Signed)
PT AWARE OF THE NEED TO SEE  DR Lovena Le IN [place] OFFICE  RE Jerilynn Mages ONIOTR  RESULTS WILL SEND MESSAGE TO  EP SCHEDULER .Adonis Housekeeper

## 2014-05-21 NOTE — Telephone Encounter (Signed)
APPT HAS BEEN MADE .Adonis Housekeeper

## 2014-06-10 ENCOUNTER — Ambulatory Visit: Payer: Medicare Other | Admitting: Cardiovascular Disease

## 2014-06-19 ENCOUNTER — Ambulatory Visit: Payer: Medicare Other | Admitting: Internal Medicine

## 2014-06-30 ENCOUNTER — Encounter: Payer: Self-pay | Admitting: Cardiovascular Disease

## 2014-06-30 ENCOUNTER — Ambulatory Visit (INDEPENDENT_AMBULATORY_CARE_PROVIDER_SITE_OTHER): Payer: Medicare Other | Admitting: Cardiovascular Disease

## 2014-06-30 VITALS — BP 110/70 | HR 66 | Ht 71.0 in | Wt 211.0 lb

## 2014-06-30 DIAGNOSIS — E0590 Thyrotoxicosis, unspecified without thyrotoxic crisis or storm: Secondary | ICD-10-CM | POA: Diagnosis not present

## 2014-06-30 DIAGNOSIS — I10 Essential (primary) hypertension: Secondary | ICD-10-CM

## 2014-06-30 DIAGNOSIS — I4891 Unspecified atrial fibrillation: Secondary | ICD-10-CM | POA: Diagnosis not present

## 2014-06-30 DIAGNOSIS — I480 Paroxysmal atrial fibrillation: Secondary | ICD-10-CM | POA: Insufficient documentation

## 2014-06-30 DIAGNOSIS — Z87898 Personal history of other specified conditions: Secondary | ICD-10-CM

## 2014-06-30 DIAGNOSIS — Z9289 Personal history of other medical treatment: Secondary | ICD-10-CM

## 2014-06-30 NOTE — Progress Notes (Signed)
Patient ID: Jose Lamb, male   DOB: Nov 02, 1960, 54 y.o.   MRN: [id number]      SUBJECTIVE: The patient presents for follow-up after undergoing cardiovascular testing. This is my first time meeting him. He is a patient of Dr. Johnsie Cancel.  He has a history of HIV, tachycardia, and duodenal ulcer. Event monitoring demonstrated paroxysmal rapid atrial fibrillation associated with shortness of breath and weakness. Denies chest pain.  Followed by infectious disease department at [facility].   EGD on 03/30/14 demonstrated no residual peptic ulcer disease and a small hiatal hernia.  Echocardiogram on 03/24/14 demonstrated normal left ventricular systolic function and regional wall motion, EF 60-65%, mild mitral regurgitation, and moderate left atrial dilatation.  However, he was evaluated on 2/11 in ED for tachycardia and palpitations. TSH was ordered and was found to be very low at <0.010.  He went from 256 pounds to 174 pounds. He then followed up at [facility] and was appropriately diagnosed with hyperthyroidism and tells me he has been receiving treatment for the past 3 months. They did a thyroid ultrasound and apparently also did nuclear studies and have been doing repeat blood tests. For unclear reasons, I am not able to access his records via [facility] ([facility] EMR) this morning. He follows with endocrinology there.  He has been taking Coreg 3.125 mg twice daily. He is allergic to aspirin. He avoids sodium and does not consume much caffeine. He currently denies palpitations, chest pain, shortness of breath, and leg swelling.  Has been on antiretroviral therapy since 1993.   Review of Systems: As per "subjective", otherwise negative.  Allergies  Allergen Reactions  . Asa [Aspirin] Other (See Comments)    Nose bleeds    Current Outpatient Prescriptions  Medication Sig Dispense Refill  . albuterol (PROVENTIL HFA;VENTOLIN HFA) 108 (90 BASE) MCG/ACT inhaler Inhale 2 puffs into  the lungs every 6 (six) hours as needed for wheezing or shortness of breath.    . carvedilol (COREG) 3.125 MG tablet Take 1 tablet (3.125 mg total) by mouth 2 (two) times daily. 60 tablet 3  . Darunavir Ethanolate (PREZISTA) 800 MG tablet Take 800 mg by mouth at bedtime.     [name] emtricitabine-tenofovir (TRUVADA) 200-300 MG per tablet Take 1 tablet by mouth at bedtime.     . Etravirine (INTELENCE) 200 MG TABS Take 1 tablet by mouth at bedtime.     . gabapentin (NEURONTIN) 300 MG capsule Take 300 mg by mouth 3 (three) times daily.    [name] omeprazole (PRILOSEC OTC) 20 MG tablet Take 1 tablet (20 mg total) by mouth daily. 60 tablet 1  . propranolol (INDERAL) 10 MG tablet Take 1 tablet daily as needed for racing heart or palpitations 30 tablet 3  . ritonavir (NORVIR) 100 MG capsule Take 100 mg by mouth at bedtime.     . triamterene-hydrochlorothiazide (MAXZIDE-25) 37.5-25 MG per tablet Take 1 tablet by mouth at bedtime.     . valACYclovir (VALTREX) 500 MG tablet Take 500 mg by mouth 2 (two) times daily as needed (for  flares).      No current facility-administered medications for this visit.    Past Medical History  Diagnosis Date  . Hypertension   . HIV antibody positive   . Perforated duodenal ulcer 2015    contained, no surg, spontaneous resolution    Past Surgical History  Procedure Laterality Date  . None    . Colonoscopy  Sept 2015    [place]: transverse colon  polyp with pathology reporting lymphoid nodule   . Esophagogastroduodenoscopy N/A 03/30/2014    Procedure: ESOPHAGOGASTRODUODENOSCOPY (EGD);  Surgeon: Daneil Dolin, MD;  Location[facility];  Service: Endoscopy;  Laterality: N/A;  215    History   Social History  . Marital Status: Married    Spouse Name: N/A  . Number of Children: N/A  . Years of Education: N/A   Occupational History  . preacher    Social History Main Topics  . Smoking status: Never Smoker   . Smokeless tobacco: Never Used  . Alcohol Use: No    . Drug Use: No  . Sexual Activity:    Partners: Female   Other Topics Concern  . Not on file   Social History Narrative     Filed Vitals:   06/30/14 0905  BP: 110/70  Pulse: 66  Height: 5\' 11"  (1.803 m)  Weight: 211 lb (95.709 kg)  SpO2: 97%    PHYSICAL EXAM General: NAD HEENT: Normal. Neck: No JVD, no thyromegaly. Lungs: Clear to auscultation bilaterally with normal respiratory effort. CV: Nondisplaced PMI.  Regular rate and rhythm, normal S1/S2, no S3/S4, no murmur. No pretibial or periankle edema.  No carotid bruit.  Normal pedal pulses.  Abdomen: Soft, nontender, no hepatosplenomegaly, no distention.  Neurologic: Alert and oriented x 3.  Psych: Normal affect. Skin: Normal. Musculoskeletal: Normal range of motion, no gross deformities. Extremities: No clubbing or cyanosis.   ECG: Most recent ECG reviewed.      ASSESSMENT AND PLAN: 1. New onset rapid atrial fibrillation: Etiology is hyperthyroidism for which she has reportedly been receiving treatment for the past 3 months. I will again try to obtain records from [facility]. I have instructed him to take carvedilol 3.125 mg once daily for the next 3 days and then to discontinue it altogether. If he were to have a recurrence of tachycardia and palpitations, I may then consider starting diltiazem 30 mg as needed. CHADSVASC score is 1 (hypertension), thus anticoagulation is not warranted. He does not require antiarrhythmic therapy.  2. Essential HTN: Controlled. No changes.  Dispo: f/u 3 months.    Time spent: 40 minutes, of which greater than 50% was spent reviewing symptoms, relevant blood tests and studies, and discussing management plan with the patient.   Kate Sable, M.D., F.A.C.C.

## 2014-06-30 NOTE — Patient Instructions (Addendum)
Your physician recommends that you schedule a follow-up appointment in: 3 months with Dr Bronson Ing    Take Coreg ONCE a day for 3 days and then STOP      Thank you for choosing [facility] !

## 2014-07-10 ENCOUNTER — Encounter: Payer: Self-pay | Admitting: Internal Medicine

## 2014-07-10 ENCOUNTER — Ambulatory Visit (INDEPENDENT_AMBULATORY_CARE_PROVIDER_SITE_OTHER): Payer: Medicare Other | Admitting: Internal Medicine

## 2014-07-10 DIAGNOSIS — I4891 Unspecified atrial fibrillation: Secondary | ICD-10-CM

## 2014-07-10 MED ORDER — CARVEDILOL 3.125 MG PO TABS: 3.1250 mg | ORAL_TABLET | ORAL | Status: DC | PRN

## 2014-07-10 NOTE — Patient Instructions (Signed)
Your physician recommends that you schedule a follow-up appointment with Dr. Lovena Le as needed.   Your physician recommends that you schedule a follow-up appointment with Dr. Bronson Ing in 3 months   Take Coreg as needed for palpitations  Up to 3 tablets daily  Thank you for choosing [facility]!

## 2014-07-10 NOTE — Progress Notes (Signed)
HPI [name] is referred today for evaluation of atrial fibrillation. 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 As he has become euthyroid, his symptoms have resolved. He wore a cardiac monitor several months ago which demonstrated atrial fib with a RVR. He has not had syncope. He had peripheral edema and sob when he was out of rhythm. He notes rare brief palpitations. Allergies  Allergen Reactions  . Asa [Aspirin] Other (See Comments)    Nose bleeds     Current Outpatient Prescriptions  Medication Sig Dispense Refill  . albuterol (PROVENTIL HFA;VENTOLIN HFA) 108 (90 BASE) MCG/ACT inhaler Inhale 2 puffs into the lungs every 6 (six) hours as needed for wheezing or shortness of breath.    [name] albuterol (PROVENTIL HFA;VENTOLIN HFA) 108 (90 BASE) MCG/ACT inhaler INHALE 2 PUFFS DAILY AS NEEDED. FREQUENCY:PRN DOSAGE:2 PUFFS INSTRUCTIONS: NOTE:DOSE: 2 PUFFS    . Darunavir Ethanolate (PREZISTA) 800 MG tablet Take 800 mg by mouth at bedtime.     [name] emtricitabine-tenofovir (TRUVADA) 200-300 MG per tablet Take 1 tablet by mouth at bedtime.     . Etravirine (INTELENCE) 200 MG TABS Take 1 tablet by mouth at bedtime.     . gabapentin (NEURONTIN) 300 MG capsule Take 300 mg by mouth 3 (three) times daily.    [name] omeprazole (PRILOSEC OTC) 20 MG tablet Take 1 tablet (20 mg total) by mouth daily. 60 tablet 1  . propranolol (INDERAL) 10 MG tablet Take 1 tablet daily as needed for racing heart or palpitations 30 tablet 3  . ritonavir (NORVIR) 100 MG capsule Take 100 mg by mouth at bedtime.     . triamterene-hydrochlorothiazide (MAXZIDE-25) 37.5-25 MG per tablet Take 1 tablet by mouth at bedtime.     . valACYclovir (VALTREX) 500 MG tablet Take 500 mg by mouth 2 (two) times daily as needed (for  flares).      No current facility-administered medications for this visit.     Past Medical History  Diagnosis Date  . Hypertension   . HIV antibody positive   .  Perforated duodenal ulcer 2015    contained, no surg, spontaneous resolution    ROS:   All systems reviewed and negative except as noted in the HPI.   Past Surgical History  Procedure Laterality Date  . None    . Colonoscopy  Sept 2015    [place]: transverse colon polyp with pathology reporting lymphoid nodule   . Esophagogastroduodenoscopy N/A 03/30/2014    Procedure: ESOPHAGOGASTRODUODENOSCOPY (EGD);  Surgeon: Daneil Dolin, MD;  Location: [facility];  Service: Endoscopy;  Laterality: N/A;  215     Family History  Problem Relation Age of Onset  . Colon cancer Neg Hx   . Ulcers Mother      History   Social History  . Marital Status: Married    Spouse Name: N/A  . Number of Children: N/A  . Years of Education: N/A   Occupational History  . preacher    Social History Main Topics  . Smoking status: Never Smoker   . Smokeless tobacco: Never Used  . Alcohol Use: No  . Drug Use: No  . Sexual Activity:    Partners: Female   Other Topics Concern  . Not on file   Social History Narrative     BP 128/73 mmHg  Pulse 83  Ht 5\' 11"  (1.803 m)  Wt 213 lb (96.616 kg)  BMI  29.72 kg/m2  SpO2 96%  Physical Exam:  Well appearing middle aged man, NAD HEENT: Unremarkable Neck:  6 cm JVD, minimal thyromegally Back:  No CVA tenderness Lungs:  Clear with no wheezes HEART:  Regular rate rhythm, no murmurs, no rubs, no clicks Abd:  soft, positive bowel sounds, no organomegally, no rebound, no guarding Ext:  2 plus pulses, no edema, no cyanosis, no clubbing Skin:  No rashes no nodules Neuro:  CN II through XII intact, motor grossly intact  EKG - NSR   Assess/Plan:

## 2014-07-10 NOTE — Assessment & Plan Note (Signed)
He now appears to be maintaining NSR as he is euthyroid. He may have recurrent atrial fib in the future, and additional medications could be used depending on the severity of his symptoms. He does not need systemic anti-coagulation. No indication for anti-arrhythmic medications.

## 2014-07-10 NOTE — Assessment & Plan Note (Signed)
His blood pressure is well controlled off of beta blockers. He is not taking propranolol.

## 2014-09-03 ENCOUNTER — Other Ambulatory Visit: Payer: Self-pay

## 2014-09-03 ENCOUNTER — Telehealth: Payer: Self-pay

## 2014-09-03 MED ORDER — OMEPRAZOLE MAGNESIUM 20 MG PO TBEC: 20.0000 mg | DELAYED_RELEASE_TABLET | Freq: Every day | ORAL | Status: DC

## 2014-09-03 NOTE — Telephone Encounter (Signed)
T/C from [place] at [facility], said they had sent a request for refill on Omeprazole and had not heard back

## 2014-09-03 NOTE — Telephone Encounter (Signed)
Done

## 2014-10-05 ENCOUNTER — Encounter: Payer: Self-pay | Admitting: Cardiovascular Disease

## 2014-10-05 ENCOUNTER — Ambulatory Visit (INDEPENDENT_AMBULATORY_CARE_PROVIDER_SITE_OTHER): Payer: Medicare Other | Admitting: Cardiovascular Disease

## 2014-10-05 VITALS — BP 118/74 | HR 86 | Ht 71.0 in | Wt 206.4 lb

## 2014-10-05 DIAGNOSIS — I4891 Unspecified atrial fibrillation: Secondary | ICD-10-CM | POA: Diagnosis not present

## 2014-10-05 DIAGNOSIS — E0590 Thyrotoxicosis, unspecified without thyrotoxic crisis or storm: Secondary | ICD-10-CM | POA: Diagnosis not present

## 2014-10-05 DIAGNOSIS — I10 Essential (primary) hypertension: Secondary | ICD-10-CM

## 2014-10-05 NOTE — Patient Instructions (Signed)

## 2014-10-05 NOTE — Progress Notes (Signed)
Patient ID: Jose Lamb, male   DOB: 01-03-1961, 54 y.o.   MRN: [id number]      SUBJECTIVE: The patient presents for follow-up of paroxysmal atrial fibrillation which occurred in the setting of hyperthyroidism. He also has a history of HIV and duodenal ulcer. Echocardiogram on 03/24/14 demonstrated normal left ventricular systolic function and regional wall motion, EF 60-65%, mild mitral regurgitation, and moderate left atrial dilatation.  Has palpitations lasting roughly 4 seconds once a month. Denies chest pain, leg swelling, and shortness of breath.  Endocrinology notes from [facility] note that they "typically maintain beta blockers until the TSH is above 0.1, therefore it is possible that we'd recommend re-initiation of beta blocker tx, if he remains hyperthyroid at this time, given prior history of atrial fibrillation."   Review of Systems: As per "subjective", otherwise negative.  Allergies  Allergen Reactions  . Asa [Aspirin] Other (See Comments)    Nose bleeds    Current Outpatient Prescriptions  Medication Sig Dispense Refill  . albuterol (PROVENTIL HFA;VENTOLIN HFA) 108 (90 BASE) MCG/ACT inhaler Inhale 2 puffs into the lungs every 6 (six) hours as needed for wheezing or shortness of breath.    [name] albuterol (PROVENTIL HFA;VENTOLIN HFA) 108 (90 BASE) MCG/ACT inhaler INHALE 2 PUFFS DAILY AS NEEDED. FREQUENCY:PRN DOSAGE:2 PUFFS INSTRUCTIONS: NOTE:DOSE: 2 PUFFS    . carvedilol (COREG) 3.125 MG tablet Take 1 tablet (3.125 mg total) by mouth as needed (Take  as needed . May Take up to 3 Pills per day). 180 tablet 3  . Darunavir Ethanolate (PREZISTA) 800 MG tablet Take 800 mg by mouth at bedtime.     [name] emtricitabine-tenofovir (TRUVADA) 200-300 MG per tablet Take 1 tablet by mouth at bedtime.     . Etravirine (INTELENCE) 200 MG TABS Take 1 tablet by mouth at bedtime.     . gabapentin (NEURONTIN) 300 MG capsule Take 300 mg by mouth 3 (three) times daily.    [name] omeprazole (PRILOSEC OTC) 20 MG  tablet Take 1 tablet (20 mg total) by mouth daily. 30 tablet 11  . propranolol (INDERAL) 10 MG tablet Take 1 tablet daily as needed for racing heart or palpitations 30 tablet 3  . ritonavir (NORVIR) 100 MG capsule Take 100 mg by mouth at bedtime.     . triamterene-hydrochlorothiazide (MAXZIDE-25) 37.5-25 MG per tablet Take 1 tablet by mouth at bedtime.     . valACYclovir (VALTREX) 500 MG tablet Take 500 mg by mouth 2 (two) times daily as needed (for  flares).      No current facility-administered medications for this visit.    Past Medical History  Diagnosis Date  . Hypertension   . HIV antibody positive   . Perforated duodenal ulcer 2015    contained, no surg, spontaneous resolution    Past Surgical History  Procedure Laterality Date  . None    . Colonoscopy  Sept 2015    [place]: transverse colon polyp with pathology reporting lymphoid nodule   . Esophagogastroduodenoscopy N/A 03/30/2014    Procedure: ESOPHAGOGASTRODUODENOSCOPY (EGD);  Surgeon: Daneil Dolin, MD;  Location: [facility];  Service: Endoscopy;  Laterality: N/A;  215    Social History   Social History  . Marital Status: Married    Spouse Name: N/A  . Number of Children: N/A  . Years of Education: N/A   Occupational History  . preacher    Social History Main Topics  . Smoking status: Never Smoker   . Smokeless tobacco: Never Used  .  Alcohol Use: No  . Drug Use: No  . Sexual Activity:    Partners: Female   Other Topics Concern  . Not on file   Social History Narrative     Filed Vitals:   10/05/14 0914  BP: 118/74  Pulse: 86  Height: 5\' 11"  (1.803 m)  Weight: 206 lb 6.4 oz (93.622 kg)  SpO2: 95%    PHYSICAL EXAM General: NAD HEENT: Normal. Neck: No JVD, no thyromegaly. Lungs: Clear to auscultation bilaterally with normal respiratory effort. CV: Nondisplaced PMI.  Regular rate and rhythm, normal S1/S2, no S3/S4, no murmur. No pretibial or periankle edema.  No carotid bruit.  Normal  pedal pulses.  Abdomen: Soft, nontender, no hepatosplenomegaly, no distention.  Neurologic: Alert and oriented x 3.  Psych: Normal affect. Skin: Normal. Musculoskeletal: Normal range of motion, no gross deformities. Extremities: No clubbing or cyanosis.   ECG: Most recent ECG reviewed.      ASSESSMENT AND PLAN: 1. Atrial fibrillation: Symptomatically stable and without significant recurrence. If he were to have a recurrence of tachycardia and palpitations, I may then consider starting diltiazem 30 mg as needed. CHADSVASC score is 1 (hypertension), thus anticoagulation is not warranted. He does not require antiarrhythmic therapy. Endocrinology at [facility] may decide to reinstitute AV nodal blocking therapy in the future.  2. Essential HTN: Controlled. No changes.  3. Hyperthyroidism: Followed by [facility]. Etiology of atrial fibrillation.  Dispo: f/u 1 year.   Kate Sable, M.D., F.A.C.C.

## 2014-11-05 DIAGNOSIS — J45909 Unspecified asthma, uncomplicated: Secondary | ICD-10-CM | POA: Insufficient documentation

## 2014-11-26 DIAGNOSIS — R7303 Prediabetes: Secondary | ICD-10-CM | POA: Insufficient documentation

## 2015-01-26 ENCOUNTER — Emergency Department (HOSPITAL_COMMUNITY)
Admission: EM | Admit: 2015-01-26 | Discharge: 2015-01-26 | Disposition: A | Discharge status: Home / Self Care | Payer: Medicare Other | Attending: Emergency Medicine | Admitting: Emergency Medicine

## 2015-01-26 ENCOUNTER — Emergency Department (HOSPITAL_COMMUNITY): Payer: Medicare Other

## 2015-01-26 ENCOUNTER — Encounter (HOSPITAL_COMMUNITY): Payer: Self-pay | Admitting: Emergency Medicine

## 2015-01-26 DIAGNOSIS — Z21 Asymptomatic human immunodeficiency virus [HIV] infection status: Secondary | ICD-10-CM | POA: Diagnosis not present

## 2015-01-26 DIAGNOSIS — R05 Cough: Secondary | ICD-10-CM | POA: Diagnosis present

## 2015-01-26 DIAGNOSIS — E876 Hypokalemia: Secondary | ICD-10-CM | POA: Diagnosis not present

## 2015-01-26 DIAGNOSIS — J159 Unspecified bacterial pneumonia: Secondary | ICD-10-CM | POA: Diagnosis not present

## 2015-01-26 DIAGNOSIS — Z79899 Other long term (current) drug therapy: Secondary | ICD-10-CM | POA: Diagnosis not present

## 2015-01-26 DIAGNOSIS — Z8719 Personal history of other diseases of the digestive system: Secondary | ICD-10-CM | POA: Diagnosis not present

## 2015-01-26 DIAGNOSIS — J189 Pneumonia, unspecified organism: Secondary | ICD-10-CM

## 2015-01-26 DIAGNOSIS — I10 Essential (primary) hypertension: Secondary | ICD-10-CM | POA: Insufficient documentation

## 2015-01-26 LAB — BASIC METABOLIC PANEL
Anion gap: 8 (ref 5–15)
BUN: 14 mg/dL (ref 6–20)
CALCIUM: 8.6 mg/dL — AB (ref 8.9–10.3)
CO2: 26 mmol/L (ref 22–32)
Chloride: 105 mmol/L (ref 101–111)
Creatinine, Ser: 0.78 mg/dL (ref 0.61–1.24)
GFR calc Af Amer: >60 mL/min (ref >60)
GLUCOSE: 114 mg/dL — AB (ref 65–99)
POTASSIUM: 3 mmol/L — AB (ref 3.5–5.1)
SODIUM: 139 mmol/L (ref 135–145)

## 2015-01-26 LAB — CBC WITH DIFFERENTIAL/PLATELET
BASOS ABS: 0 10*3/uL (ref 0.0–0.1)
BASOS PCT: 1 %
EOS ABS: 0.3 10*3/uL (ref 0.0–0.7)
EOS PCT: 4 %
HCT: 38.2 % — ABNORMAL LOW (ref 39.0–52.0)
Hemoglobin: 13.1 g/dL (ref 13.0–17.0)
LYMPHS PCT: 25 %
Lymphs Abs: 2 10*3/uL (ref 0.7–4.0)
MCH: 31.7 pg (ref 26.0–34.0)
MCHC: 34.3 g/dL (ref 30.0–36.0)
MCV: 92.5 fL (ref 78.0–100.0)
MONO ABS: 0.8 10*3/uL (ref 0.1–1.0)
Monocytes Relative: 10 %
Neutro Abs: 4.9 10*3/uL (ref 1.7–7.7)
Neutrophils Relative %: 60 %
PLATELETS: 169 10*3/uL (ref 150–400)
RBC: 4.13 MIL/uL — AB (ref 4.22–5.81)
RDW: 13 % (ref 11.5–15.5)
WBC: 8 10*3/uL (ref 4.0–10.5)

## 2015-01-26 MED ORDER — ALBUTEROL SULFATE HFA 108 (90 BASE) MCG/ACT IN AERS: 2.0000 | INHALATION_SPRAY | RESPIRATORY_TRACT | Status: DC | PRN

## 2015-01-26 MED ORDER — AZITHROMYCIN 500 MG IV SOLR
500.0000 mg | INTRAVENOUS | Status: DC
  Administered 2015-01-26: 500 mg via INTRAVENOUS
  Filled 2015-01-26: qty 500

## 2015-01-26 MED ORDER — AZITHROMYCIN 250 MG PO TABS: 250.0000 mg | ORAL_TABLET | Freq: Every day | ORAL | Status: AC

## 2015-01-26 MED ORDER — ALBUTEROL SULFATE (2.5 MG/3ML) 0.083% IN NEBU
2.5000 mg | INHALATION_SOLUTION | Freq: Once | RESPIRATORY_TRACT | Status: AC
  Administered 2015-01-26: 2.5 mg via RESPIRATORY_TRACT
  Filled 2015-01-26: qty 3

## 2015-01-26 MED ORDER — POTASSIUM CHLORIDE CRYS ER 20 MEQ PO TBCR
40.0000 meq | EXTENDED_RELEASE_TABLET | Freq: Once | ORAL | Status: AC
  Administered 2015-01-26: 40 meq via ORAL
  Filled 2015-01-26: qty 2

## 2015-01-26 MED ORDER — AEROCHAMBER Z-STAT PLUS/MEDIUM MISC: 1.0000 | Freq: Once | Status: DC

## 2015-01-26 MED ORDER — IPRATROPIUM-ALBUTEROL 0.5-2.5 (3) MG/3ML IN SOLN
3.0000 mL | Freq: Once | RESPIRATORY_TRACT | Status: AC
  Administered 2015-01-26: 3 mL via RESPIRATORY_TRACT
  Filled 2015-01-26: qty 3

## 2015-01-26 MED ORDER — SODIUM CHLORIDE 0.9 % IV SOLN
INTRAVENOUS | Status: DC
  Administered 2015-01-26: 07:00:00 via INTRAVENOUS

## 2015-01-26 MED ORDER — CEFTRIAXONE SODIUM 1 G IJ SOLR
1.0000 g | Freq: Once | INTRAMUSCULAR | Status: AC
  Administered 2015-01-26: 1 g via INTRAVENOUS
  Filled 2015-01-26: qty 10

## 2015-01-26 MED ORDER — IPRATROPIUM BROMIDE 0.02 % IN SOLN: 0.5000 mg | Freq: Once | RESPIRATORY_TRACT | Status: DC

## 2015-01-26 MED ORDER — ALBUTEROL SULFATE (2.5 MG/3ML) 0.083% IN NEBU: 5.0000 mg | INHALATION_SOLUTION | Freq: Once | RESPIRATORY_TRACT | Status: DC

## 2015-01-26 MED ORDER — POTASSIUM CHLORIDE CRYS ER 20 MEQ PO TBCR: 20.0000 meq | EXTENDED_RELEASE_TABLET | Freq: Two times a day (BID) | ORAL | Status: DC

## 2015-01-26 NOTE — Discharge Instructions (Signed)
Take the antibiotics daily starting tomorrow until gone. Use the inhaler for wheezing or shortness of breath. Monitor your fever. If you get worse such as a high fever, struggling to breathe, vomiting, you should contact your doctor at [facility].   Community-Acquired Pneumonia, Adult Pneumonia is an infection of the lungs. There are different types of pneumonia. One type can develop while a person is in a hospital. A different type, called community-acquired pneumonia, develops in people who are not, or have not recently been, in the hospital or other health care facility.  CAUSES Pneumonia may be caused by bacteria, viruses, or funguses. Community-acquired pneumonia is often caused by Streptococcus pneumonia bacteria. These bacteria are often passed from one person to another by breathing in droplets from the cough or sneeze of an infected person. RISK FACTORS The condition is more likely to develop in:  People who havechronic diseases, such as chronic obstructive pulmonary disease (COPD), asthma, congestive heart failure, cystic fibrosis, diabetes, or kidney disease.  People who haveearly-stage or late-stage HIV.  People who havesickle cell disease.  People who havehad their spleen removed (splenectomy).  People who havepoor Human resources officer.  People who havemedical conditions that increase the risk of breathing in (aspirating) secretions their own mouth and nose.   People who havea weakened immune system (immunocompromised).  People who smoke.  People whotravel to areas where pneumonia-causing germs commonly exist.  People whoare around animal habitats or animals that have pneumonia-causing germs, including birds, bats, rabbits, cats, and farm animals. SYMPTOMS Symptoms of this condition include:  Adry cough.  A wet (productive) cough.  Fever.  Sweating.  Chest pain, especially when breathing deeply or coughing.  Rapid breathing or difficulty breathing.  Shortness of  breath.  Shaking chills.  Fatigue.  Muscle aches. DIAGNOSIS Your health care provider will take a medical history and perform a physical exam. You may also have other tests, including:  Imaging studies of your chest, including X-rays.  Tests to check your blood oxygen level and other blood gases.  Other tests on blood, mucus (sputum), fluid around your lungs (pleural fluid), and urine. If your pneumonia is severe, other tests may be done to identify the specific cause of your illness. TREATMENT The type of treatment that you receive depends on many factors, such as the cause of your pneumonia, the medicines you take, and other medical conditions that you have. For most adults, treatment and recovery from pneumonia may occur at home. In some cases, treatment must happen in a hospital. Treatment may include:  Antibiotic medicines, if the pneumonia was caused by bacteria.  Antiviral medicines, if the pneumonia was caused by a virus.  Medicines that are given by mouth or through an IV tube.  Oxygen.  Respiratory therapy. Although rare, treating severe pneumonia may include:  Mechanical ventilation. This is done if you are not breathing well on your own and you cannot maintain a safe blood oxygen level.  Thoracentesis. This procedureremoves fluid around one lung or both lungs to help you breathe better. HOME CARE INSTRUCTIONS  Take over-the-counter and prescription medicines only as told by your health care provider.  Only takecough medicine if you are losing sleep. Understand that cough medicine can prevent your body's natural ability to remove mucus from your lungs.  If you were prescribed an antibiotic medicine, take it as told by your health care provider. Do not stop taking the antibiotic even if you start to feel better.  Sleep in a semi-upright position at night. Try sleeping  in a reclining chair, or place a few pillows under your head.  Do not use tobacco products,  including cigarettes, chewing tobacco, and e-cigarettes. If you need help quitting, ask your health care provider.  Drink enough water to keep your urine clear or pale yellow. This will help to thin out mucus secretions in your lungs. PREVENTION There are ways that you can decrease your risk of developing community-acquired pneumonia. Consider getting a pneumococcal vaccine if:  You are older than 52 years of age.  You are older than 52 years of age and are undergoing cancer treatment, have chronic lung disease, or have other medical conditions that affect your immune system. Ask your health care provider if this applies to you. There are different types and schedules of pneumococcal vaccines. Ask your health care provider which vaccination option is best for you. You may also prevent community-acquired pneumonia if you take these actions:  Get an influenza vaccine every year. Ask your health care provider which type of influenza vaccine is best for you.  Go to the dentist on a regular basis.  Wash your hands often. Use hand sanitizer if soap and water are not available. SEEK MEDICAL CARE IF:  You have a fever.  You are losing sleep because you cannot control your cough with cough medicine. SEEK IMMEDIATE MEDICAL CARE IF:  You have worsening shortness of breath.  You have increased chest pain.  Your sickness becomes worse, especially if you are an older adult or have a weakened immune system.  You cough up blood.   This information is not intended to replace advice given to you by your health care provider. Make sure you discuss any questions you have with your health care provider.   Document Released: 01/23/2005 Document Revised: 10/14/2014 Document Reviewed: 05/20/2014 Elsevier Interactive Patient Education [employer].

## 2015-01-26 NOTE — Progress Notes (Signed)
**Note De-Identified  Obfuscation** Aerochamber instructed for patient home use.

## 2015-01-26 NOTE — ED Provider Notes (Signed)
CSN: [id number]     Arrival date & time 01/26/15  0533 History   First MD Initiated Contact with Patient 01/26/15 0545   Chief Complaint  Patient presents with  . Cough     (Consider location/radiation/quality/duration/timing/severity/associated sxs/prior Treatment) HPI  Patient reports on December 17 he started having a cough with clear yellow mucus, sinus drainage that he describes as being white and clear, sneezing, sore throat, his voice is hoarse, with central chest pain when he coughs. He denies earache or wheezing although he has had wheezing and used inhalers in the past. He thinks he may have had a fever yesterday however he did not check his temperature. He did have some chills. He denies nausea, vomiting, or diarrhea. He states he did have vomiting one week ago but that resolved. 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   PCP ID at [facility]  Past Medical History  Diagnosis Date  . Hypertension   . 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 Perforated duodenal ulcer ([place]) 2015    contained, no surg, spontaneous resolution   Past Surgical History  Procedure Laterality Date  . None    . Colonoscopy  Sept 2015    [place]: transverse colon polyp with pathology reporting lymphoid nodule   . Esophagogastroduodenoscopy N/A 03/30/2014    Procedure: ESOPHAGOGASTRODUODENOSCOPY (EGD);  Surgeon: Daneil Dolin, MD;  Location: [facility];  Service: Endoscopy;  Laterality: N/A;  215   Family History  Problem Relation Age of Onset  . Colon cancer Neg Hx   . Ulcers Mother    Social History  Substance Use Topics  . Smoking status: Never Smoker   . Smokeless tobacco: Never Used  . Alcohol Use: No  on disability for his HIV +  Review of Systems  All other systems reviewed and are negative.     Allergies  Asa  Home Medications   [place] Prior to Admission medications   Medication Sig Start Date End Date Taking? Authorizing Provider   carvedilol (COREG) 3.125 MG tablet Take 1 tablet (3.125 mg total) by mouth as needed (Take  as needed . May Take up to 3 Pills per day). 07/10/14  Yes Evans Lance, MD  Darunavir Ethanolate (PREZISTA) 800 MG tablet Take 800 mg by mouth at bedtime.    Yes Historical Provider, MD  Etravirine (INTELENCE) 200 MG TABS Take 1 tablet by mouth at bedtime.    Yes Historical Provider, MD  gabapentin (NEURONTIN) 300 MG capsule Take 300 mg by mouth 3 (three) times daily.   Yes Historical Provider, MD  omeprazole (PRILOSEC OTC) 20 MG tablet Take 1 tablet (20 mg total) by mouth daily. 09/03/14  Yes Mahala Menghini, [name]  propranolol (INDERAL) 10 MG tablet Take 1 tablet daily as needed for racing heart or palpitations 03/24/14  Yes [name], MD  ritonavir (NORVIR) 100 MG capsule Take 100 mg by mouth at bedtime.    Yes Historical Provider, MD  triamterene-hydrochlorothiazide (MAXZIDE-25) 37.5-25 MG per tablet Take 1 tablet by mouth at bedtime.    Yes Historical Provider, MD  valACYclovir (VALTREX) 500 MG tablet Take 500 mg by mouth 2 (two) times daily as needed (for  flares).    Yes Historical Provider, MD  albuterol (PROVENTIL HFA;VENTOLIN HFA) 108 (90 BASE) MCG/ACT inhaler Inhale 2 puffs into the lungs every 4 (four) hours as needed. 01/26/15   Rolland Porter, MD  azithromycin (  ZITHROMAX) 250 MG tablet Take 1 tablet (250 mg total) by mouth daily. 01/27/15 01/30/15  Rolland Porter, MD  emtricitabine-tenofovir (TRUVADA) 200-300 MG per tablet Take 1 tablet by mouth at bedtime.     Historical Provider, MD  potassium chloride SA (K-DUR,KLOR-CON) 20 MEQ tablet Take 1 tablet (20 mEq total) by mouth 2 (two) times daily. 01/26/15   Rolland Porter, MD   BP 126/73 mmHg  Pulse 84  Temp(Src) 98.5 F (36.9 C)  Resp 22  Ht 5\' 11"  (1.803 m)  Wt 212 lb (96.163 kg)  BMI 29.58 kg/m2  SpO2 97%  Vital signs normal   Physical Exam  Constitutional: He is oriented to person, place, and time. He appears well-developed and well-nourished.   Non-toxic appearance. He does not appear ill. No distress.  HENT:  Head: Normocephalic and atraumatic.  Right Ear: External ear normal.  Left Ear: External ear normal.  Nose: Nose normal. No mucosal edema or rhinorrhea.  Mouth/Throat: Oropharynx is clear and moist and mucous membranes are normal. No dental abscesses or uvula swelling.  Eyes: Conjunctivae and EOM are normal. Pupils are equal, round, and reactive to light.  Neck: Normal range of motion and full passive range of motion without pain. Neck supple.  Cardiovascular: Normal rate, regular rhythm and normal heart sounds.  Exam reveals no gallop and no friction rub.   No murmur heard. Pulmonary/Chest: Effort normal and breath sounds normal. No respiratory distress. He has no wheezes. He has no rhonchi. He has no rales. He exhibits no tenderness and no crepitus.  Pt had a faint wheeze then coughed and the wheezing resolved  Abdominal: Soft. Normal appearance and bowel sounds are normal. He exhibits no distension. There is no tenderness. There is no rebound and no guarding.  Musculoskeletal: Normal range of motion. He exhibits no edema or tenderness.  Moves all extremities well.   Neurological: He is alert and oriented to person, place, and time. He has normal strength. No cranial nerve deficit.  Skin: Skin is warm, dry and intact. No rash noted. No erythema. No pallor.  Psychiatric: He has a normal mood and affect. His speech is normal and behavior is normal. His mood appears not anxious.  Nursing note and vitals reviewed.   ED Course  Procedures (including critical care time)  Medications  0.9 %  sodium chloride infusion ( Intravenous New Bag/Given 01/26/15 0717)  azithromycin (ZITHROMAX) 500 mg in dextrose 5 % 250 mL IVPB (500 mg Intravenous New Bag/Given 01/26/15 0810)  aerochamber Z-Stat Plus/medium 1 each (not administered)  ipratropium-albuterol (DUONEB) 0.5-2.5 (3) MG/3ML nebulizer solution 3 mL (3 mLs Nebulization Given  01/26/15 0610)  albuterol (PROVENTIL) (2.5 MG/3ML) 0.083% nebulizer solution 2.5 mg (2.5 mg Nebulization Given 01/26/15 0611)  cefTRIAXone (ROCEPHIN) 1 g in dextrose 5 % 50 mL IVPB (0 g Intravenous Stopped 01/26/15 0747)  potassium chloride SA (K-DUR,KLOR-CON) CR tablet 40 mEq (40 mEq Oral Given 01/26/15 0813)   Patient was given an albuterol nebulizer because I did hear some wheezing prior to him coughing. He was sent to have a chest x-ray.  After reviewing patient's chest x-ray he had an IV inserted. Laboratory results were done. He was started on community-acquired pneumonia antibiotics.  Patient was ambulated by nursing staff and his pulse ox remained 97-99% on room air. Patient felt fine while ambulating.  Patient's potassium was low at 3.0 he was started on oral supplementation.     Labs Review Results for orders placed or performed during the hospital encounter  of [id number]  Basic metabolic panel  Result Value Ref Range   Sodium 139 135 - 145 mmol/L   Potassium 3.0 (L) 3.5 - 5.1 mmol/L   Chloride 105 101 - 111 mmol/L   CO2 26 22 - 32 mmol/L   Glucose, Bld 114 (H) 65 - 99 mg/dL   BUN 14 6 - 20 mg/dL   Creatinine, Ser 0.78 0.61 - 1.24 mg/dL   Calcium 8.6 (L) 8.9 - 10.3 mg/dL   GFR calc non Af Amer >60 >60 mL/min   GFR calc Af Amer >60 >60 mL/min   Anion gap 8 5 - 15  CBC with Differential  Result Value Ref Range   WBC 8.0 4.0 - 10.5 K/uL   RBC 4.13 (L) 4.22 - 5.81 MIL/uL   Hemoglobin 13.1 13.0 - 17.0 g/dL   HCT 38.2 (L) 39.0 - 52.0 %   MCV 92.5 78.0 - 100.0 fL   MCH 31.7 26.0 - 34.0 pg   MCHC 34.3 30.0 - 36.0 g/dL   RDW 13.0 11.5 - 15.5 %   Platelets 169 150 - 400 K/uL   Neutrophils Relative % 60 %   Neutro Abs 4.9 1.7 - 7.7 K/uL   Lymphocytes Relative 25 %   Lymphs Abs 2.0 0.7 - 4.0 K/uL   Monocytes Relative 10 %   Monocytes Absolute 0.8 0.1 - 1.0 K/uL   Eosinophils Relative 4 %   Eosinophils Absolute 0.3 0.0 - 0.7 K/uL   Basophils Relative 1 %   Basophils  Absolute 0.0 0.0 - 0.1 K/uL   Laboratory interpretation all normal except for hypokalemiaf     Imaging Review Dg Chest 2 View  01/26/2015  CLINICAL DATA:  Cough, shortness of breath an wheezing over the last week. EXAM: CHEST  2 VIEW COMPARISON:  04/30/2014 FINDINGS: Heart size is normal. There is atherosclerosis of the aorta. There is patchy pneumonia in the left lower lobe. The remainder of the lungs are clear with some chronic interstitial markings. No effusions. No acute bone finding. IMPRESSION: Patchy left lower lobe pneumonia. Electronically Signed   By: Nelson Chimes M.D.   On: 01/26/2015 06:41   I have personally reviewed and evaluated these images and lab results as part of my medical decision-making.   EKG Interpretation None      MDM   patient presents with cough for the past 3 days with infiltrate seen on his chest x-ray. Patient is HIV positive, however he is not neutropenic at this time. He was treated for community-acquired pneumonia, his chest x-ray is not consistent with Pneumocystis carinii.    Final diagnoses:  CAP (community acquired pneumonia)  Hypokalemia    New Prescriptions   ALBUTEROL (PROVENTIL HFA;VENTOLIN HFA) 108 (90 BASE) MCG/ACT INHALER    Inhale 2 puffs into the lungs every 4 (four) hours as needed.   AZITHROMYCIN (ZITHROMAX) 250 MG TABLET    Take 1 tablet (250 mg total) by mouth daily.   POTASSIUM CHLORIDE SA (K-DUR,KLOR-CON) 20 MEQ TABLET    Take 1 tablet (20 mEq total) by mouth 2 (two) times daily.    Plan discharge  Rolland Porter, MD, Barbette Or, MD 01/26/15 [phone]

## 2015-01-26 NOTE — ED Notes (Signed)
Patients o2 sat at 97-99% while ambulating in the hall and talking while walking.  No respiratory distress noted.

## 2015-01-26 NOTE — ED Notes (Signed)
IV removed, catheter tip intact. Gauze dressing applied, DD&I.

## 2015-01-26 NOTE — ED Notes (Signed)
Pt c/o productive cough and sinus drainage since Saturday.

## 2015-01-27 LAB — T-HELPER CELLS (CD4) COUNT (NOT AT ARMC)
CD4 T CELL HELPER: 29 % — AB (ref 33–55)
CD4 T Cell Abs: 540 /uL (ref 400–2700)

## 2015-01-31 LAB — CULTURE, BLOOD (ROUTINE X 2)
CULTURE: NO GROWTH
CULTURE: NO GROWTH

## 2015-06-09 ENCOUNTER — Encounter (HOSPITAL_COMMUNITY): Payer: Self-pay | Admitting: Emergency Medicine

## 2015-06-09 ENCOUNTER — Emergency Department (HOSPITAL_COMMUNITY)
Admission: EM | Admit: 2015-06-09 | Discharge: 2015-06-09 | Disposition: A | Discharge status: Home / Self Care | Payer: Medicaid Other | Attending: Emergency Medicine | Admitting: Emergency Medicine

## 2015-06-09 ENCOUNTER — Emergency Department (HOSPITAL_COMMUNITY): Payer: Medicaid Other

## 2015-06-09 DIAGNOSIS — I10 Essential (primary) hypertension: Secondary | ICD-10-CM | POA: Insufficient documentation

## 2015-06-09 DIAGNOSIS — M542 Cervicalgia: Secondary | ICD-10-CM | POA: Diagnosis present

## 2015-06-09 DIAGNOSIS — S46819A Strain of other muscles, fascia and tendons at shoulder and upper arm level, unspecified arm, initial encounter: Secondary | ICD-10-CM

## 2015-06-09 MED ORDER — HYDROCODONE-ACETAMINOPHEN 5-325 MG PO TABS
1.0000 | ORAL_TABLET | Freq: Once | ORAL | Status: AC
  Administered 2015-06-09: 1 via ORAL
  Filled 2015-06-09: qty 1

## 2015-06-09 MED ORDER — METHOCARBAMOL 500 MG PO TABS
1000.0000 mg | ORAL_TABLET | Freq: Once | ORAL | Status: AC
  Administered 2015-06-09: 1000 mg via ORAL
  Filled 2015-06-09: qty 2

## 2015-06-09 MED ORDER — HYDROCODONE-ACETAMINOPHEN 5-325 MG PO TABS: 1.0000 | ORAL_TABLET | ORAL | Status: DC | PRN

## 2015-06-09 MED ORDER — METHOCARBAMOL 500 MG PO TABS: 500.0000 mg | ORAL_TABLET | Freq: Three times a day (TID) | ORAL | Status: DC

## 2015-06-09 NOTE — ED Notes (Signed)
Patient verbalizes understanding of discharge instructions, prescription medications, home care and follow up care. Patient ambulatory out of department at this time. 
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
## 2015-12-11 ENCOUNTER — Emergency Department (HOSPITAL_COMMUNITY)
Admission: EM | Admit: 2015-12-11 | Discharge: 2015-12-11 | Disposition: A | Discharge status: Home / Self Care | Payer: Medicaid Other | Attending: Emergency Medicine | Admitting: Emergency Medicine

## 2015-12-11 ENCOUNTER — Emergency Department (HOSPITAL_COMMUNITY): Payer: Medicaid Other

## 2015-12-11 ENCOUNTER — Encounter (HOSPITAL_COMMUNITY): Payer: Self-pay

## 2015-12-11 DIAGNOSIS — R55 Syncope and collapse: Secondary | ICD-10-CM | POA: Insufficient documentation

## 2015-12-11 DIAGNOSIS — Z21 Asymptomatic human immunodeficiency virus [HIV] infection status: Secondary | ICD-10-CM | POA: Insufficient documentation

## 2015-12-11 DIAGNOSIS — J209 Acute bronchitis, unspecified: Secondary | ICD-10-CM | POA: Insufficient documentation

## 2015-12-11 DIAGNOSIS — R05 Cough: Secondary | ICD-10-CM | POA: Diagnosis present

## 2015-12-11 DIAGNOSIS — Z79899 Other long term (current) drug therapy: Secondary | ICD-10-CM | POA: Insufficient documentation

## 2015-12-11 DIAGNOSIS — I10 Essential (primary) hypertension: Secondary | ICD-10-CM | POA: Insufficient documentation

## 2015-12-11 MED ORDER — ALBUTEROL SULFATE HFA 108 (90 BASE) MCG/ACT IN AERS
2.0000 | INHALATION_SPRAY | RESPIRATORY_TRACT | Status: DC | PRN
  Administered 2015-12-11: 2 via RESPIRATORY_TRACT
  Filled 2015-12-11: qty 6.7

## 2015-12-11 MED ORDER — AEROCHAMBER PLUS W/MASK MISC
1.0000 | Freq: Once | Status: AC
  Administered 2015-12-11: 1
  Filled 2015-12-11: qty 1

## 2015-12-11 NOTE — Discharge Instructions (Signed)
Use your inhaler 2 puffs every 4 hours as needed for cough or shortness of breath. See your doctor at [facility] if not better in a week. Return if your condition worsens for any reason

## 2015-12-11 NOTE — ED Provider Notes (Addendum)
[facility] DEPT Provider Note   CSN: [id number] Arrival date & time: 12/11/15  [id number]     History   Chief Complaint Chief Complaint  Patient presents with  . Cough    HPI Deveion Bartles is a 55 y.o. male.  HPI Complains of productive cough for one week. Other associated symptoms include mild wheeze, shortness of breath, rhinorrhea, sneeze. No vomiting. Nothing makes symptoms better or worse. No vomiting. Has treated himself with DayQuil, without relief. No other associated symptoms Past Medical History:  Diagnosis Date  . 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 Hypertension   . Perforated duodenal ulcer ([place]) 2015   contained, no surg, spontaneous resolution  He reports CD4 count 650 viral load 0 two weeks ago Patient Active Problem List   Diagnosis Date Noted  . New onset atrial fibrillation ([place]) 06/30/2014  . Constipation 03/24/2014  . Hypertension 02/06/2014  . Moderate malnutrition ([place]) 02/06/2014  . Peptic ulcer with perforation ([place])   . Duodenal ulcer perforation ([place]) 02/04/2014  . HIV (human immunodeficiency virus infection) ([place]) 02/04/2014  . Perforated duodenal ulcer ([place]) 02/04/2014    Past Surgical History:  Procedure Laterality Date  . COLONOSCOPY  Sept 2015   [place]: transverse colon polyp with pathology reporting lymphoid nodule   . ESOPHAGOGASTRODUODENOSCOPY N/A 03/30/2014   Procedure: ESOPHAGOGASTRODUODENOSCOPY (EGD);  Surgeon: Daneil Dolin, MD;  Location: [facility];  Service: Endoscopy;  Laterality: N/A;  215  . None         Home Medications    Prior to Admission medications   Medication Sig Start Date End Date Taking? Authorizing Provider  albuterol (PROVENTIL HFA;VENTOLIN HFA) 108 (90 BASE) MCG/ACT inhaler Inhale 2 puffs into the lungs every 4 (four) hours as needed. 01/26/15   Rolland Porter, MD  carvedilol (COREG) 3.125 MG tablet Take 1 tablet (3.125 mg total) by mouth as needed (Take  as needed . May Take up to 3 Pills per day).  07/10/14   Evans Lance, MD  Darunavir Ethanolate (PREZISTA) 800 MG tablet Take 800 mg by mouth at bedtime.     Historical Provider, MD  emtricitabine-tenofovir (TRUVADA) 200-300 MG per tablet Take 1 tablet by mouth at bedtime.     Historical Provider, MD  Etravirine (INTELENCE) 200 MG TABS Take 1 tablet by mouth at bedtime.     Historical Provider, MD  gabapentin (NEURONTIN) 300 MG capsule Take 300 mg by mouth 3 (three) times daily.    Historical Provider, MD  HYDROcodone-acetaminophen (NORCO/VICODIN) 5-325 MG tablet Take 1 tablet by mouth every 4 (four) hours as needed. 06/09/15   Lily Kocher, [name]  methocarbamol (ROBAXIN) 500 MG tablet Take 1 tablet (500 mg total) by mouth 3 (three) times daily. 06/09/15   Lily Kocher, [name]  omeprazole (PRILOSEC OTC) 20 MG tablet Take 1 tablet (20 mg total) by mouth daily. 09/03/14   Mahala Menghini, [name]  potassium chloride SA (K-DUR,KLOR-CON) 20 MEQ tablet Take 1 tablet (20 mEq total) by mouth 2 (two) times daily. 01/26/15   Rolland Porter, MD  propranolol (INDERAL) 10 MG tablet Take 1 tablet daily as needed for racing heart or palpitations 03/24/14   [name], MD  ritonavir (NORVIR) 100 MG capsule Take 100 mg by mouth at bedtime.     Historical Provider, MD  triamterene-hydrochlorothiazide (MAXZIDE-25) 37.5-25 MG per tablet Take 1 tablet by mouth at bedtime.     Historical Provider, MD  valACYclovir (VALTREX) 500 MG tablet Take 500 mg by mouth 2 (  two) times daily as needed (for  flares).     Historical Provider, MD    Family History Family History  Problem Relation Age of Onset  . Ulcers Mother   . Colon cancer Neg Hx     Social History Social History  Substance Use Topics  . Smoking status: Never Smoker  . Smokeless tobacco: Never Used  . Alcohol use No     Allergies   Asa [aspirin]   Review of Systems Review of Systems  Constitutional: Negative.   HENT: Positive for rhinorrhea and sneezing.   Respiratory: Positive for cough, shortness of  breath and wheezing.   Cardiovascular: Positive for chest pain.       Syncope  Gastrointestinal: Negative.   Musculoskeletal: Negative.   Skin: Negative.   Allergic/Immunologic: Negative.   Neurological: Negative.   Psychiatric/Behavioral: Negative.   All other systems reviewed and are negative.    Physical Exam Updated Vital Signs BP 122/72 (BP Location: Right Arm)   Pulse 93   Temp 98.4 F (36.9 C) (Oral)   Resp 20   Ht 6' (1.829 m)   Wt 215 lb (97.5 kg)   SpO2 95%   BMI 29.16 kg/m   Physical Exam  Constitutional: He appears well-developed and well-nourished. No distress.  HENT:  Head: Normocephalic and atraumatic.  Bilateral tympanic membranes normal  Eyes: Conjunctivae are normal. Pupils are equal, round, and reactive to light.  Neck: Neck supple. No tracheal deviation present. No thyromegaly present.  Cardiovascular: Normal rate and regular rhythm.   No murmur heard. Pulmonary/Chest: Effort normal and breath sounds normal.  Coughing occasionally  Abdominal: Soft. Bowel sounds are normal. He exhibits no distension. There is no tenderness.  Musculoskeletal: Normal range of motion. He exhibits no edema or tenderness.  Neurological: He is alert. Coordination normal.  Skin: Skin is warm and dry. No rash noted.  Psychiatric: He has a normal mood and affect.  Nursing note and vitals reviewed.    ED Treatments / Results  Labs (all labs ordered are listed, but only abnormal results are displayed) Labs Reviewed - No data to display  EKG  EKG Interpretation None       Radiology No results found.  Procedures Procedures (including critical care time)  Medications Ordered in ED Medications - No data to display   Initial Impression / Assessment and Plan / ED Course  I have reviewed the triage vital signs and the nursing notes.  Pertinent labs & imaging results that were available during my care of the patient were reviewed by me and considered in my  medical decision making (see chart for details).  Clinical Course    8:30 AM resting comfortably. No distress. Chest x-ray viewed by me Plan albuterol HFA with spacer to go. To use 2 puffs every 4 hours when necessary cough or shortness of breath. Patient with URI symptoms. Has element of bronchitis with productive cough. Follow up with physician at [facility] if not better in a week  Results for orders placed or performed during the hospital encounter of 01/26/15  Culture, blood (Routine X 2) w Reflex to ID Panel  Result Value Ref Range   Specimen Description BLOOD LEFT HAND    Special Requests      BOTTLES DRAWN AEROBIC AND ANAEROBIC AEB=6CC ANA=8CC   Culture NO GROWTH 5 DAYS    Report Status 01/31/2015 FINAL   Culture, blood (Routine X 2) w Reflex to ID Panel  Result Value Ref Range  Specimen Description BLOOD RIGHT ANTECUBITAL    Special Requests BOTTLES DRAWN AEROBIC AND ANAEROBIC 10CC EACH    Culture NO GROWTH 5 DAYS    Report Status 01/31/2015 FINAL   Basic metabolic panel  Result Value Ref Range   Sodium 139 135 - 145 mmol/L   Potassium 3.0 (L) 3.5 - 5.1 mmol/L   Chloride 105 101 - 111 mmol/L   CO2 26 22 - 32 mmol/L   Glucose, Bld 114 (H) 65 - 99 mg/dL   BUN 14 6 - 20 mg/dL   Creatinine, Ser 0.78 0.61 - 1.24 mg/dL   Calcium 8.6 (L) 8.9 - 10.3 mg/dL   GFR calc non Af Amer >60 >60 mL/min   GFR calc Af Amer >60 >60 mL/min   Anion gap 8 5 - 15  CBC with Differential  Result Value Ref Range   WBC 8.0 4.0 - 10.5 K/uL   RBC 4.13 (L) 4.22 - 5.81 MIL/uL   Hemoglobin 13.1 13.0 - 17.0 g/dL   HCT 38.2 (L) 39.0 - 52.0 %   MCV 92.5 78.0 - 100.0 fL   MCH 31.7 26.0 - 34.0 pg   MCHC 34.3 30.0 - 36.0 g/dL   RDW 13.0 11.5 - 15.5 %   Platelets 169 150 - 400 K/uL   Neutrophils Relative % 60 %   Neutro Abs 4.9 1.7 - 7.7 K/uL   Lymphocytes Relative 25 %   Lymphs Abs 2.0 0.7 - 4.0 K/uL   Monocytes Relative 10 %   Monocytes Absolute 0.8 0.1 - 1.0 K/uL   Eosinophils Relative  4 %   Eosinophils Absolute 0.3 0.0 - 0.7 K/uL   Basophils Relative 1 %   Basophils Absolute 0.0 0.0 - 0.1 K/uL  T-helper cells (CD4) count (not at [facility])  Result Value Ref Range   CD4 T Cell Abs 540 400 - 2,700 /uL   CD4 % Helper T Cell 29 (L) 33 - 55 %   Dg Chest 2 View  Result Date: 12/11/2015 CLINICAL DATA:  Cough for 1 week.  Fever. EXAM: CHEST  2 VIEW COMPARISON:  01/26/2015 FINDINGS: Cardiac silhouette is normal in size. No mediastinal or hilar masses or evidence of adenopathy. Clear lungs.  No pleural effusion or pneumothorax. Skeletal structures are intact. IMPRESSION: No active cardiopulmonary disease. Electronically Signed   By: Lajean Manes M.D.   On: 12/11/2015 07:42    Final Clinical Impressions(s) / ED Diagnoses   Final diagnoses:  None   Diagnosis acute bronchitis New Prescriptions New Prescriptions   No medications on file     Orlie Dakin, MD 12/11/15 [place], MD 12/11/15 [phone]

## 2015-12-11 NOTE — ED Notes (Signed)
Pt waiting to be reeval and disposition. Continue to have dry hacking cough.

## 2015-12-11 NOTE — ED Triage Notes (Signed)
Pt c/o persistent cough x 1 week, taking otc meds without relief

## 2016-04-27 IMAGING — CR DG CHEST 2V
2 series · 2 of 2 positions shown · non-contrast
Comparison: CT Abdomen and Pelvis 02/04/2014 and earlier.

CLINICAL DATA: 54-year-old male with tachycardia shortness of
breath and weakness. Initial encounter.

EXAM:
CHEST  2 VIEW

[view not recorded (1 of 2)]
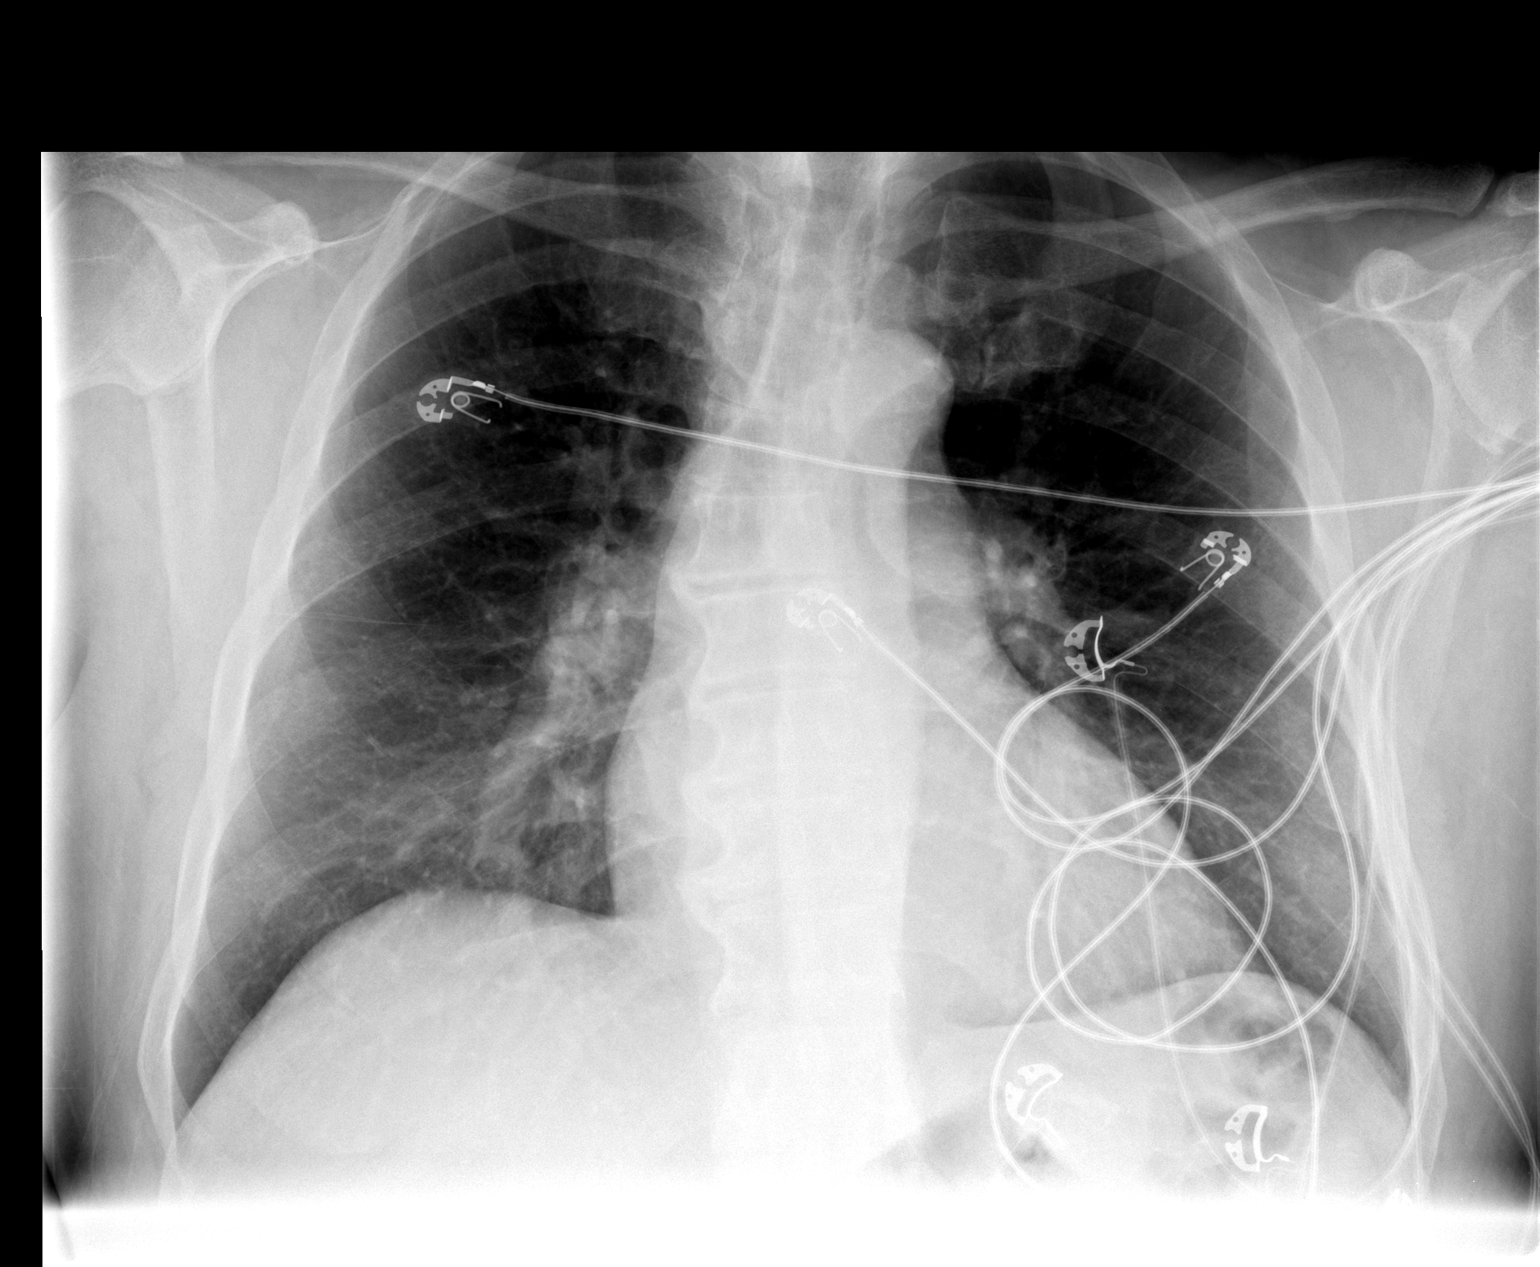

[view not recorded (2 of 2)]
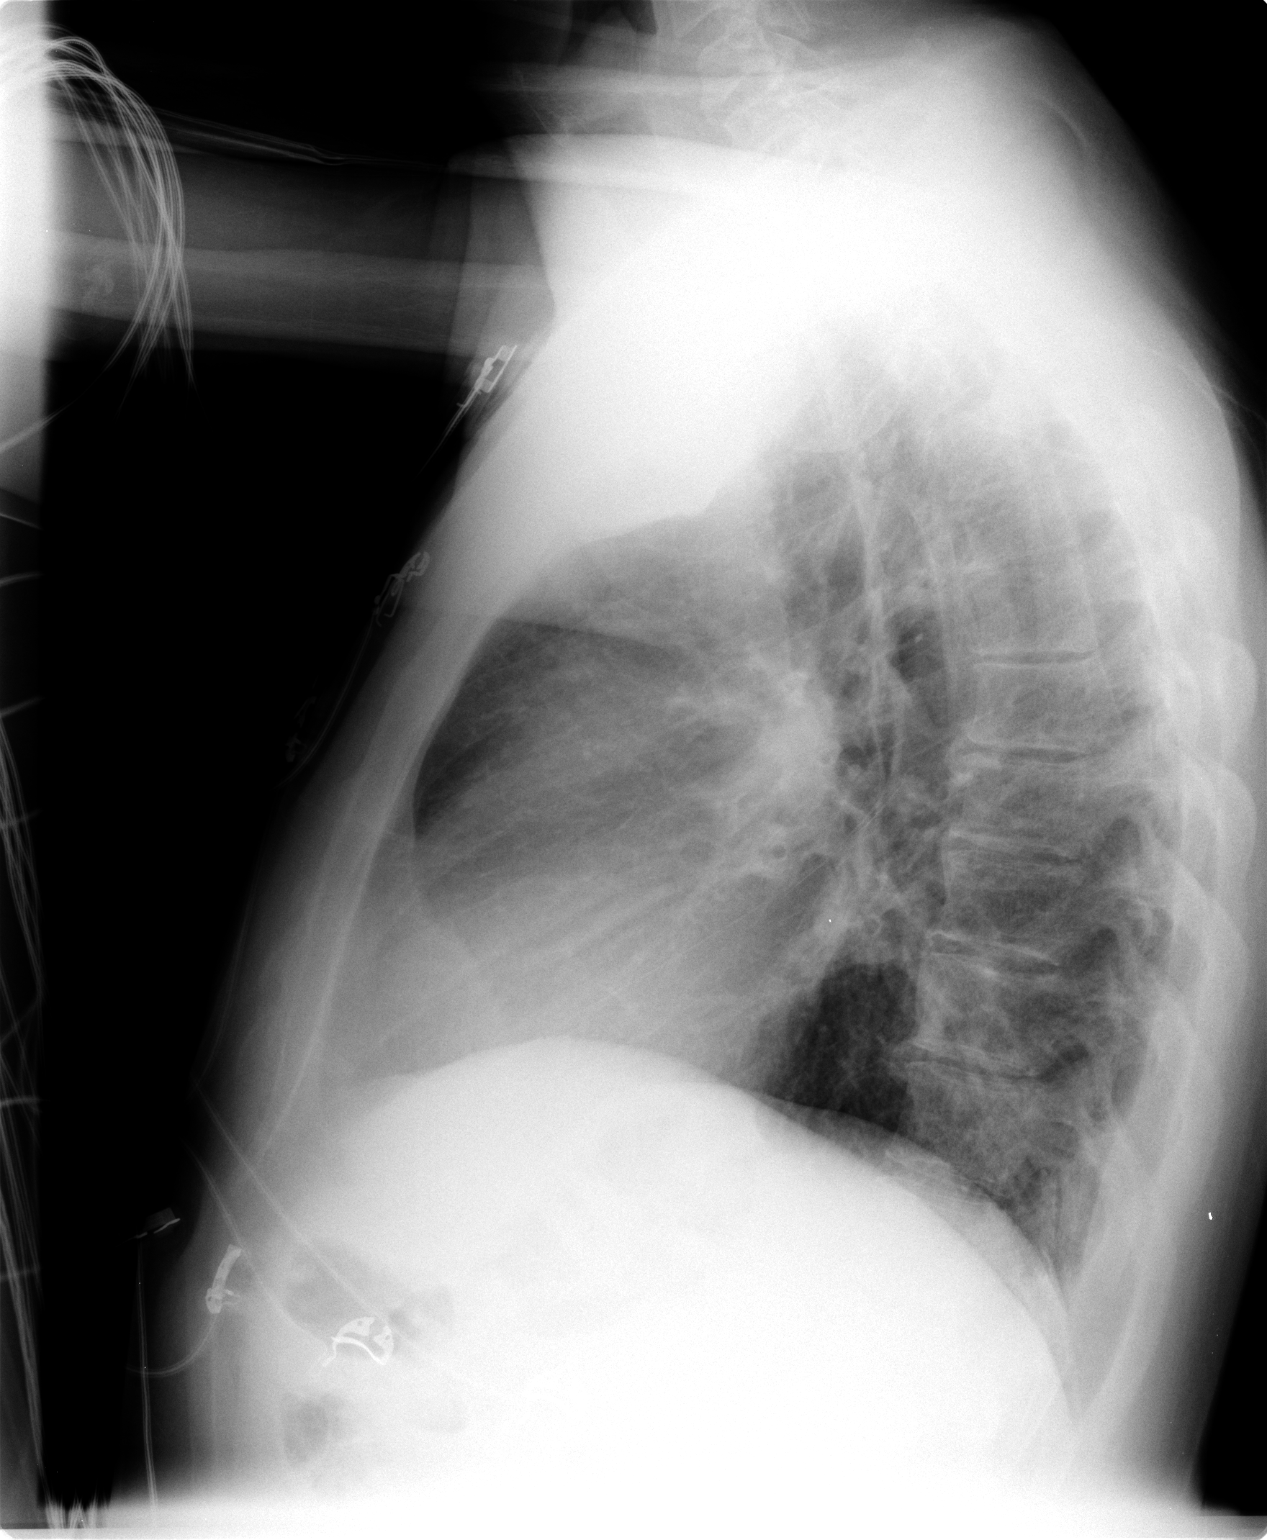

[2 of 2 positions shown; findings below may reference images not displayed]

FINDINGS: Stable lung volumes, within normal limits. Stable mild cardiomegaly.
Other mediastinal contours are within normal limits. Visualized
tracheal air column is within normal limits. No pneumothorax,
pulmonary edema, pleural effusion or confluent pulmonary opacity. No
acute osseous abnormality identified.
IMPRESSION: No acute cardiopulmonary abnormality.

## 2017-05-14 ENCOUNTER — Other Ambulatory Visit: Payer: Self-pay

## 2017-05-14 ENCOUNTER — Emergency Department (HOSPITAL_COMMUNITY): Payer: Medicaid Other

## 2017-05-14 ENCOUNTER — Encounter (HOSPITAL_COMMUNITY): Payer: Self-pay | Admitting: Emergency Medicine

## 2017-05-14 ENCOUNTER — Emergency Department (HOSPITAL_COMMUNITY)
Admission: EM | Admit: 2017-05-14 | Discharge: 2017-05-14 | Disposition: A | Discharge status: Home / Self Care | Payer: Medicaid Other | Attending: Emergency Medicine | Admitting: Emergency Medicine

## 2017-05-14 DIAGNOSIS — I4891 Unspecified atrial fibrillation: Secondary | ICD-10-CM | POA: Insufficient documentation

## 2017-05-14 DIAGNOSIS — M79672 Pain in left foot: Secondary | ICD-10-CM | POA: Diagnosis present

## 2017-05-14 DIAGNOSIS — Z79899 Other long term (current) drug therapy: Secondary | ICD-10-CM | POA: Diagnosis not present

## 2017-05-14 DIAGNOSIS — B20 Human immunodeficiency virus [HIV] disease: Secondary | ICD-10-CM | POA: Diagnosis not present

## 2017-05-14 DIAGNOSIS — I10 Essential (primary) hypertension: Secondary | ICD-10-CM | POA: Insufficient documentation

## 2017-05-14 DIAGNOSIS — B353 Tinea pedis: Secondary | ICD-10-CM | POA: Diagnosis not present

## 2017-05-14 DIAGNOSIS — M722 Plantar fascial fibromatosis: Secondary | ICD-10-CM | POA: Diagnosis not present

## 2017-05-14 MED ORDER — TERBINAFINE HCL 1 % EX CREA: 1.0000 "application " | TOPICAL_CREAM | Freq: Two times a day (BID) | CUTANEOUS | 0 refills | Status: DC

## 2017-05-14 NOTE — Discharge Instructions (Addendum)
Its important to keep your feet dry as possible and wear white socks.  Dry between your toes after bathing and apply the cream as directed.  Call the foot doctor listed to arrange a follow-up appt in one week.

## 2017-05-14 NOTE — ED Triage Notes (Signed)
Lt foot pain, swelling and redness for past few days.  Denies any injury.  Pt reports he works standing up all day and wears steel toe boots

## 2017-05-17 NOTE — ED Provider Notes (Signed)
[facility] EMERGENCY DEPARTMENT Provider Note   CSN: [id number] Arrival date & time: 05/14/17  1932     History   Chief Complaint Chief Complaint  Patient presents with  . Foot Pain    HPI Jose Lamb is a 57 y.o. male.  HPI   Jose Lamb is a 57 y.o. male who presents to the Emergency Department complaining of left foot pain and swelling for nearly one week.  States that he walks and stands all day at his job and foot pain is worse with weight bearing.  Has to wear steel toe boots.  Describes the pain as throbbing.  Also feels that his feet are red on top and itching and burning sensation between his toes.  Denies numbness, injury, pain or swelling proximal to his ankle.    Past Medical History:  Diagnosis Date  . 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 Hypertension   . Perforated duodenal ulcer ([place]) 2015   contained, no surg, spontaneous resolution    Patient Active Problem List   Diagnosis Date Noted  . New onset atrial fibrillation ([place]) 06/30/2014  . Constipation 03/24/2014  . Hypertension 02/06/2014  . Moderate malnutrition ([place]) 02/06/2014  . Peptic ulcer with perforation ([place])   . Duodenal ulcer perforation ([place]) 02/04/2014  . HIV (human immunodeficiency virus infection) ([place]) 02/04/2014  . Perforated duodenal ulcer ([place]) 02/04/2014    Past Surgical History:  Procedure Laterality Date  . COLONOSCOPY  Sept 2015   [place]: transverse colon polyp with pathology reporting lymphoid nodule   . ESOPHAGOGASTRODUODENOSCOPY N/A 03/30/2014   Procedure: ESOPHAGOGASTRODUODENOSCOPY (EGD);  Surgeon: Daneil Dolin, MD;  Location: [facility];  Service: Endoscopy;  Laterality: N/A;  215  . None          Home Medications    Prior to Admission medications   Medication Sig Start Date End Date Taking? Authorizing Provider  albuterol (PROVENTIL HFA;VENTOLIN HFA) 108 (90 BASE) MCG/ACT inhaler Inhale 2 puffs into the lungs every 4 (four) hours as needed. 01/26/15    Rolland Porter, MD  carvedilol (COREG) 3.125 MG tablet Take 1 tablet (3.125 mg total) by mouth as needed (Take  as needed . May Take up to 3 Pills per day). 07/10/14   Evans Lance, MD  Darunavir Ethanolate (PREZISTA) 800 MG tablet Take 800 mg by mouth at bedtime.     [provider]  emtricitabine-tenofovir (TRUVADA) 200-300 MG per tablet Take 1 tablet by mouth at bedtime.     [provider]  Etravirine (INTELENCE) 200 MG TABS Take 1 tablet by mouth at bedtime.     [provider]  gabapentin (NEURONTIN) 300 MG capsule Take 300 mg by mouth 3 (three) times daily.    [provider]  HYDROcodone-acetaminophen (NORCO/VICODIN) 5-325 MG tablet Take 1 tablet by mouth every 4 (four) hours as needed. 06/09/15   Lily Kocher, [name]  methocarbamol (ROBAXIN) 500 MG tablet Take 1 tablet (500 mg total) by mouth 3 (three) times daily. 06/09/15   Lily Kocher, [name]  omeprazole (PRILOSEC OTC) 20 MG tablet Take 1 tablet (20 mg total) by mouth daily. 09/03/14   Mahala Menghini, [name]  potassium chloride SA (K-DUR,KLOR-CON) 20 MEQ tablet Take 1 tablet (20 mEq total) by mouth 2 (two) times daily. 01/26/15   Rolland Porter, MD  propranolol (INDERAL) 10 MG tablet Take 1 tablet daily as needed for racing heart or palpitations 03/24/14   [name], MD  ritonavir (NORVIR) 100 MG capsule  Take 100 mg by mouth at bedtime.     [provider]  terbinafine (LAMISIL AT) 1 % cream Apply 1 application topically 2 (two) times daily. 05/14/17   Yomar Mejorado, [name]  triamterene-hydrochlorothiazide (MAXZIDE-25) 37.5-25 MG per tablet Take 1 tablet by mouth at bedtime.     [provider]  valACYclovir (VALTREX) 500 MG tablet Take 500 mg by mouth 2 (two) times daily as needed (for  flares).     [provider]    Family History Family History  Problem Relation Age of Onset  . Ulcers Mother   . Colon cancer Neg Hx     Social History Social History   Tobacco Use  .  Smoking status: Never Smoker  . Smokeless tobacco: Never Used  Substance Use Topics  . Alcohol use: No    Alcohol/week: 0.0 oz  . Drug use: No     Allergies   Asa [aspirin]   Review of Systems Review of Systems  Constitutional: Negative for chills and fever.  Respiratory: Negative for shortness of breath.   Cardiovascular: Negative for chest pain.  Musculoskeletal: Positive for arthralgias (left foot pain) and myalgias. Negative for joint swelling.  Skin: Negative for color change, rash and wound.  Neurological: Negative for weakness and numbness.  All other systems reviewed and are negative.    Physical Exam Updated Vital Signs BP 127/72 (BP Location: Right Arm)   Pulse 73   Temp 98.6 F (37 C) (Oral)   Resp 18   Ht 6' (1.829 m)   SpO2 99%   BMI 29.16 kg/m   Physical Exam  Constitutional: He is oriented to person, place, and time. He appears well-developed and well-nourished. No distress.  HENT:  Head: Normocephalic and atraumatic.  Cardiovascular: Normal rate, regular rhythm and intact distal pulses.  Pulmonary/Chest: Effort normal and breath sounds normal.  Musculoskeletal: He exhibits tenderness. He exhibits no edema or deformity.  ttp of the hind foot and plantar surface of the hind foot.  No edema or erythema on exam.  Serous drainage and peeling of the skin of the web spaces of the third and fourth and fourth and fifth toes.    Neurological: He is alert and oriented to person, place, and time. No sensory deficit. He exhibits normal muscle tone. Coordination normal.  Skin: Skin is warm and dry. Capillary refill takes less than 2 seconds.  Nursing note and vitals reviewed.    ED Treatments / Results  Labs (all labs ordered are listed, but only abnormal results are displayed) Labs Reviewed - No data to display  EKG None  Radiology Dg Foot Complete Left  Result Date: 05/14/2017 CLINICAL DATA:  Left heel pain radiating to the toes starting 2 days ago.  EXAM: LEFT FOOT - COMPLETE 3+ VIEW COMPARISON:  None. FINDINGS: 8 mm plantar calcaneal enthesophyte with minimal soft tissue mineralization along the expected location of the plantar fascia. Mild soft tissue swelling about the malleoli. No fracture of the foot or joint dislocation. Accessory ossicle noted adjacent to the tarsal navicular. IMPRESSION: Prominent plantar calcaneal spur. Soft tissue mineralization along the plantar aspect of the hindfoot that may reflect plantar fasciitis. Electronically Signed   By: Ashley Royalty M.D.   On: 05/14/2017 21:19    Procedures Procedures (including critical care time)  Medications Ordered in ED Medications - No data to display   Initial Impression / Assessment and Plan / ED Course  I have reviewed the triage vital signs and the nursing notes.  Pertinent labs & imaging results that were available during my care of the patient were reviewed by me and considered in my medical decision making (see chart for details).     Pt with likel plantar fasciitis and tinea pedis.  NV intact.  No edema or concerning sx's for gout or cellulitis.  Referral info given for podiatry.  rx for anti-fungal and foot care instructions discussed.   Final Clinical Impressions(s) / ED Diagnoses   Final diagnoses:  Plantar fasciitis of left foot  Tinea pedis of left foot    ED Discharge Orders        Ordered    terbinafine (LAMISIL AT) 1 % cream  2 times daily     05/14/17 2147       Kem Parkinson, [name] 05/17/17 2143    Hayden Rasmussen, MD 05/18/17 [phone]

## 2017-07-01 ENCOUNTER — Emergency Department (HOSPITAL_COMMUNITY): Payer: Medicaid Other

## 2017-07-01 ENCOUNTER — Emergency Department (HOSPITAL_COMMUNITY)
Admission: EM | Admit: 2017-07-01 | Discharge: 2017-07-01 | Disposition: A | Discharge status: Home / Self Care | Payer: Medicaid Other | Attending: Emergency Medicine | Admitting: Emergency Medicine

## 2017-07-01 ENCOUNTER — Encounter (HOSPITAL_COMMUNITY): Payer: Self-pay | Admitting: Emergency Medicine

## 2017-07-01 DIAGNOSIS — R05 Cough: Secondary | ICD-10-CM | POA: Diagnosis present

## 2017-07-01 DIAGNOSIS — Z79899 Other long term (current) drug therapy: Secondary | ICD-10-CM | POA: Diagnosis not present

## 2017-07-01 DIAGNOSIS — J3089 Other allergic rhinitis: Secondary | ICD-10-CM | POA: Diagnosis not present

## 2017-07-01 DIAGNOSIS — I10 Essential (primary) hypertension: Secondary | ICD-10-CM | POA: Diagnosis not present

## 2017-07-01 DIAGNOSIS — B20 Human immunodeficiency virus [HIV] disease: Secondary | ICD-10-CM | POA: Insufficient documentation

## 2017-07-01 MED ORDER — ALBUTEROL SULFATE HFA 108 (90 BASE) MCG/ACT IN AERS
2.0000 | INHALATION_SPRAY | Freq: Once | RESPIRATORY_TRACT | Status: AC
  Administered 2017-07-01: 2 via RESPIRATORY_TRACT
  Filled 2017-07-01: qty 6.7

## 2017-07-01 MED ORDER — BENZONATATE 100 MG PO CAPS
200.0000 mg | ORAL_CAPSULE | Freq: Once | ORAL | Status: AC
  Administered 2017-07-01: 200 mg via ORAL
  Filled 2017-07-01: qty 2

## 2017-07-01 MED ORDER — BENZONATATE 100 MG PO CAPS: 200.0000 mg | ORAL_CAPSULE | Freq: Three times a day (TID) | ORAL | 0 refills | Status: DC | PRN

## 2017-07-01 NOTE — Discharge Instructions (Addendum)
Suspect your symptoms are mostly allergy related.  As discussed pick up either Claritin-D or Zyrtec-D or the generic version of 1 of these medications to help you with the nasal symptoms and the postnasal drip.  Use the Tessalon for cough relief.  You may use 2 puffs of the inhaler given if you are wheezing every 4 hours as needed.

## 2017-07-01 NOTE — ED Triage Notes (Signed)
Pt reports a nonproductive cough with sore throat for a week with a lot of congestion and sinus drainage.

## 2017-07-01 NOTE — ED Provider Notes (Signed)
[facility] EMERGENCY DEPARTMENT Provider Note   CSN: [id number] Arrival date & time: 07/01/17  6834     History   Chief Complaint Chief Complaint  Patient presents with  . Cough    HPI Jose Lamb is a 57 y.o. male presenting with a 1 week history of uri type symptoms which includes nasal congestion with clear rhinorrhea, post nasal drip, sneezing, itchy eyes and cough which has sometimes been productive of a clear sputum.  He also endorses occasional wheezing, worse 2 days ago but better after using his wife's asthma inhaler.  His symptoms started after mowing the grass.   Symptoms do not include shortness of breath, chest pain,  Nausea, vomiting or diarrhea.  The patient has taken no medicines prior to arrival.  The history is provided by the patient.    Past Medical History:  Diagnosis Date  . 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 Hypertension   . Perforated duodenal ulcer ([place]) 2015   contained, no surg, spontaneous resolution    Patient Active Problem List   Diagnosis Date Noted  . New onset atrial fibrillation ([place]) 06/30/2014  . Constipation 03/24/2014  . Hypertension 02/06/2014  . Moderate malnutrition ([place]) 02/06/2014  . Peptic ulcer with perforation ([place])   . Duodenal ulcer perforation ([place]) 02/04/2014  . HIV (human immunodeficiency virus infection) ([place]) 02/04/2014  . Perforated duodenal ulcer ([place]) 02/04/2014    Past Surgical History:  Procedure Laterality Date  . COLONOSCOPY  Sept 2015   [place]: transverse colon polyp with pathology reporting lymphoid nodule   . ESOPHAGOGASTRODUODENOSCOPY N/A 03/30/2014   Procedure: ESOPHAGOGASTRODUODENOSCOPY (EGD);  Surgeon: Daneil Dolin, MD;  Location: [facility];  Service: Endoscopy;  Laterality: N/A;  215  . None          Home Medications    Prior to Admission medications   Medication Sig Start Date End Date Taking? Authorizing Provider  albuterol (PROVENTIL HFA;VENTOLIN HFA) 108 (90 BASE) MCG/ACT  inhaler Inhale 2 puffs into the lungs every 4 (four) hours as needed. Patient not taking: Reported on 07/01/2017 01/26/15   Rolland Porter, MD  benzonatate (TESSALON) 100 MG capsule Take 2 capsules (200 mg total) by mouth 3 (three) times daily as needed. 07/01/17   Evalee Jefferson, [name]  carvedilol (COREG) 3.125 MG tablet Take 1 tablet (3.125 mg total) by mouth as needed (Take  as needed . May Take up to 3 Pills per day). Patient not taking: Reported on 07/01/2017 07/10/14   Evans Lance, MD  HYDROcodone-acetaminophen (NORCO/VICODIN) 5-325 MG tablet Take 1 tablet by mouth every 4 (four) hours as needed. Patient not taking: Reported on 07/01/2017 06/09/15   Lily Kocher, [name]  methocarbamol (ROBAXIN) 500 MG tablet Take 1 tablet (500 mg total) by mouth 3 (three) times daily. Patient not taking: Reported on 07/01/2017 06/09/15   Lily Kocher, [name]  omeprazole (PRILOSEC OTC) 20 MG tablet Take 1 tablet (20 mg total) by mouth daily. Patient not taking: Reported on 07/01/2017 09/03/14   Mahala Menghini, [name]  potassium chloride SA (K-DUR,KLOR-CON) 20 MEQ tablet Take 1 tablet (20 mEq total) by mouth 2 (two) times daily. Patient not taking: Reported on 07/01/2017 01/26/15   Rolland Porter, MD  propranolol (INDERAL) 10 MG tablet Take 1 tablet daily as needed for racing heart or palpitations 03/24/14   [name], MD  terbinafine (LAMISIL AT) 1 % cream Apply 1 application topically 2 (two) times daily. Patient not taking: Reported on 07/01/2017 05/14/17  Triplett, Tammy, [name]  valACYclovir (VALTREX) 500 MG tablet Take 500 mg by mouth 2 (two) times daily as needed (for  flares).     [provider]    Family History Family History  Problem Relation Age of Onset  . Ulcers Mother   . Colon cancer Neg Hx     Social History Social History   Tobacco Use  . Smoking status: Never Smoker  . Smokeless tobacco: Never Used  Substance Use Topics  . Alcohol use: No    Alcohol/week: 0.0 oz  . Drug use: No      Allergies   Asa [aspirin]   Review of Systems Review of Systems  Constitutional: Negative for chills, diaphoresis and fever.  HENT: Positive for congestion, postnasal drip, rhinorrhea and sore throat. Negative for ear pain, sinus pressure, trouble swallowing and voice change.   Eyes: Positive for itching. Negative for discharge.  Respiratory: Positive for cough and wheezing. Negative for shortness of breath and stridor.   Cardiovascular: Negative for chest pain and palpitations.  Gastrointestinal: Negative for abdominal pain, nausea and vomiting.  Genitourinary: Negative.      Physical Exam Updated Vital Signs BP 120/87 (BP Location: Right Arm)   Pulse 96   Temp 98.8 F (37.1 C) (Oral)   Resp 20   Ht 6' (1.829 m)   Wt 97.5 kg (215 lb)   SpO2 96%   BMI 29.16 kg/m   Physical Exam  Constitutional: He is oriented to person, place, and time. He appears well-developed and well-nourished.  HENT:  Head: Normocephalic and atraumatic.  Right Ear: Tympanic membrane and ear canal normal.  Left Ear: Tympanic membrane and ear canal normal.  Nose: Mucosal edema and rhinorrhea present.  Mouth/Throat: Uvula is midline, oropharynx is clear and moist and mucous membranes are normal. No oropharyngeal exudate, posterior oropharyngeal edema, posterior oropharyngeal erythema or tonsillar abscesses.  Eyes: Conjunctivae are normal.  Cardiovascular: Normal rate and normal heart sounds.  Pulmonary/Chest: Effort normal. No stridor. No respiratory distress. He has no decreased breath sounds. He has wheezes in the left upper field. He has no rhonchi. He has no rales.  Musculoskeletal: Normal range of motion.  Neurological: He is alert and oriented to person, place, and time.  Skin: Skin is warm and dry. No rash noted.  Psychiatric: He has a normal mood and affect.     ED Treatments / Results  Labs (all labs ordered are listed, but only abnormal results are displayed) Labs Reviewed - No  data to display  EKG None  Radiology Dg Chest 2 View  Result Date: 07/01/2017 CLINICAL DATA:  Cough. EXAM: CHEST - 2 VIEW COMPARISON:  Radiograph of December 11, 2015. FINDINGS: The heart size and mediastinal contours are within normal limits. Both lungs are clear. No pneumothorax or pleural effusion is noted. The visualized skeletal structures are unremarkable. IMPRESSION: No active cardiopulmonary disease. Electronically Signed   By: Marijo Conception, M.D.   On: 07/01/2017 09:22    Procedures Procedures (including critical care time)  Medications Ordered in ED Medications  benzonatate (TESSALON) capsule 200 mg (has no administration in time range)  albuterol (PROVENTIL HFA;VENTOLIN HFA) 108 (90 Base) MCG/ACT inhaler 2 puff (has no administration in time range)     Initial Impression / Assessment and Plan / ED Course  I have reviewed the triage vital signs and the nursing notes.  Pertinent labs & imaging results that were available during my care of the patient were reviewed by me and considered  in my medical decision making (see chart for details).     Imaging reviewed and discussed with patient.  Patient's symptoms are suggestive of possibly acute URI, but more likely is allergic rhinitis with postnasal drip causing cough and wheeze.  His chest x-ray was clear with no indication for antibiotics at this time.  He was advised home treatments including Claritin D or Zyrtec-D, he may add cough drops, he was also prescribed Tessalon.  Was also given an albuterol MDI for as needed use of cough and wheezing.  Plan recheck for persistent or worsening symptoms.  Final Clinical Impressions(s) / ED Diagnoses   Final diagnoses:  Environmental and seasonal allergies    ED Discharge Orders        Ordered    benzonatate (TESSALON) 100 MG capsule  3 times daily PRN     07/01/17 1102       Evalee Jefferson, [name] 07/01/17 1104    Noemi Chapel, MD 07/01/17 1535

## 2017-12-31 ENCOUNTER — Other Ambulatory Visit: Payer: Self-pay

## 2017-12-31 ENCOUNTER — Emergency Department (HOSPITAL_COMMUNITY)
Admission: EM | Admit: 2017-12-31 | Discharge: 2017-12-31 | Disposition: A | Discharge status: Home / Self Care | Payer: Managed Care, Other (non HMO) | Attending: Emergency Medicine | Admitting: Emergency Medicine

## 2017-12-31 ENCOUNTER — Encounter (HOSPITAL_COMMUNITY): Payer: Self-pay | Admitting: Emergency Medicine

## 2017-12-31 ENCOUNTER — Emergency Department (HOSPITAL_COMMUNITY): Payer: Managed Care, Other (non HMO)

## 2017-12-31 DIAGNOSIS — M7918 Myalgia, other site: Secondary | ICD-10-CM | POA: Diagnosis not present

## 2017-12-31 DIAGNOSIS — R05 Cough: Secondary | ICD-10-CM | POA: Diagnosis present

## 2017-12-31 DIAGNOSIS — R070 Pain in throat: Secondary | ICD-10-CM | POA: Insufficient documentation

## 2017-12-31 DIAGNOSIS — I10 Essential (primary) hypertension: Secondary | ICD-10-CM | POA: Diagnosis not present

## 2017-12-31 DIAGNOSIS — B20 Human immunodeficiency virus [HIV] disease: Secondary | ICD-10-CM | POA: Diagnosis not present

## 2017-12-31 DIAGNOSIS — Z79899 Other long term (current) drug therapy: Secondary | ICD-10-CM | POA: Insufficient documentation

## 2017-12-31 DIAGNOSIS — J111 Influenza due to unidentified influenza virus with other respiratory manifestations: Secondary | ICD-10-CM | POA: Diagnosis not present

## 2017-12-31 DIAGNOSIS — R509 Fever, unspecified: Secondary | ICD-10-CM | POA: Insufficient documentation

## 2017-12-31 DIAGNOSIS — R69 Illness, unspecified: Secondary | ICD-10-CM

## 2017-12-31 LAB — INFLUENZA PANEL BY PCR (TYPE A & B)
Influenza A By PCR: NEGATIVE
Influenza B By PCR: NEGATIVE

## 2017-12-31 MED ORDER — OSELTAMIVIR PHOSPHATE 75 MG PO CAPS
75.0000 mg | ORAL_CAPSULE | Freq: Once | ORAL | Status: AC
  Administered 2017-12-31: 75 mg via ORAL
  Filled 2017-12-31: qty 1

## 2017-12-31 MED ORDER — OSELTAMIVIR PHOSPHATE 75 MG PO CAPS: 75.0000 mg | ORAL_CAPSULE | Freq: Two times a day (BID) | ORAL | 0 refills | Status: DC

## 2017-12-31 MED ORDER — ONDANSETRON 8 MG PO TBDP: 8.0000 mg | ORAL_TABLET | Freq: Three times a day (TID) | ORAL | 0 refills | Status: DC | PRN

## 2017-12-31 NOTE — ED Provider Notes (Signed)
[facility] EMERGENCY DEPARTMENT Provider Note   CSN: [id number] Arrival date & time: 12/31/17  0044     History   Chief Complaint Chief Complaint  Patient presents with  . Generalized Body Aches    HPI Barth Trella is a 57 y.o. male.  HPI  57 year old male with history of HIV comes in with chief complaint of flulike illness.  Patient states that about 2 days ago he started developing cough followed by body aches, chills, fevers.  T-max at home is 101.  Patient's cough is producing clear phlegm.  He also has runny nose and sore throat and reports that his grandson has similar symptoms that have been going on for 1 week.  Patient did not get influenza vaccination this year.  He has no underlying lung disease.  Past Medical History:  Diagnosis Date  . 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 Hypertension   . Perforated duodenal ulcer ([place]) 2015   contained, no surg, spontaneous resolution    Patient Active Problem List   Diagnosis Date Noted  . New onset atrial fibrillation ([place]) 06/30/2014  . Constipation 03/24/2014  . Hypertension 02/06/2014  . Moderate malnutrition ([place]) 02/06/2014  . Peptic ulcer with perforation ([place])   . Duodenal ulcer perforation ([place]) 02/04/2014  . HIV (human immunodeficiency virus infection) ([place]) 02/04/2014  . Perforated duodenal ulcer ([place]) 02/04/2014    Past Surgical History:  Procedure Laterality Date  . COLONOSCOPY  Sept 2015   [place]: transverse colon polyp with pathology reporting lymphoid nodule   . ESOPHAGOGASTRODUODENOSCOPY N/A 03/30/2014   Procedure: ESOPHAGOGASTRODUODENOSCOPY (EGD);  Surgeon: Daneil Dolin, MD;  Location: [facility]O [facility];  Service: Endoscopy;  Laterality: N/A;  215  . None          Home Medications    Prior to Admission medications   Medication Sig Start Date End Date Taking? Authorizing Provider  propranolol (INDERAL) 10 MG tablet Take 1 tablet daily as needed for racing heart or palpitations 03/24/14  Yes  [name], MD  terbinafine (LAMISIL AT) 1 % cream Apply 1 application topically 2 (two) times daily. 05/14/17  Yes Triplett, Tammy, [name]  valACYclovir (VALTREX) 500 MG tablet Take 500 mg by mouth 2 (two) times daily as needed (for  flares).    Yes [provider]  albuterol (PROVENTIL HFA;VENTOLIN HFA) 108 (90 BASE) MCG/ACT inhaler Inhale 2 puffs into the lungs every 4 (four) hours as needed. Patient not taking: Reported on 07/01/2017 01/26/15   Rolland Porter, MD  benzonatate (TESSALON) 100 MG capsule Take 2 capsules (200 mg total) by mouth 3 (three) times daily as needed. 07/01/17   Evalee Jefferson, [name]  carvedilol (COREG) 3.125 MG tablet Take 1 tablet (3.125 mg total) by mouth as needed (Take  as needed . May Take up to 3 Pills per day). Patient not taking: Reported on 07/01/2017 07/10/14   Evans Lance, MD  HYDROcodone-acetaminophen (NORCO/VICODIN) 5-325 MG tablet Take 1 tablet by mouth every 4 (four) hours as needed. Patient not taking: Reported on 07/01/2017 06/09/15   Lily Kocher, [name]  methocarbamol (ROBAXIN) 500 MG tablet Take 1 tablet (500 mg total) by mouth 3 (three) times daily. Patient not taking: Reported on 07/01/2017 06/09/15   Lily Kocher, [name]  omeprazole (PRILOSEC OTC) 20 MG tablet Take 1 tablet (20 mg total) by mouth daily. Patient not taking: Reported on 07/01/2017 09/03/14   Mahala Menghini, [name]  potassium chloride SA (K-DUR,KLOR-CON) 20 MEQ tablet Take 1 tablet (20 mEq  total) by mouth 2 (two) times daily. Patient not taking: Reported on 07/01/2017 01/26/15   Rolland Porter, MD    Family History Family History  Problem Relation Age of Onset  . Ulcers Mother   . Colon cancer Neg Hx     Social History Social History   Tobacco Use  . Smoking status: Never Smoker  . Smokeless tobacco: Never Used  Substance Use Topics  . Alcohol use: No    Alcohol/week: 0.0 standard drinks  . Drug use: No     Allergies   Asa [aspirin]   Review of Systems Review of Systems    Constitutional: Positive for activity change, chills and fever.  HENT: Positive for congestion, postnasal drip, rhinorrhea and sore throat.   Respiratory: Positive for cough.   Allergic/Immunologic: Positive for immunocompromised state.     Physical Exam Updated Vital Signs BP (!) 154/92 (BP Location: Right Arm)   Pulse 100   Temp 97.9 F (36.6 C) (Oral)   Resp (!) 22   Ht 6' (1.829 m)   Wt 117.9 kg   SpO2 95%   BMI 35.26 kg/m   Physical Exam  Constitutional: He is oriented to person, place, and time. He appears well-developed.  HENT:  Head: Atraumatic.  Mouth/Throat: Oropharynx is clear and moist. No oropharyngeal exudate.  Neck: Neck supple.  Cardiovascular: Normal rate.  Pulmonary/Chest: Effort normal.  Lymphadenopathy:    He has cervical adenopathy.  Neurological: He is alert and oriented to person, place, and time.  Skin: Skin is warm.  Nursing note and vitals reviewed.    ED Treatments / Results  Labs (all labs ordered are listed, but only abnormal results are displayed) Labs Reviewed  INFLUENZA PANEL BY PCR (TYPE A & B)    EKG None  Radiology Dg Chest 2 View  Result Date: 12/31/2017 CLINICAL DATA:  Cough and fever EXAM: CHEST - 2 VIEW COMPARISON:  07/01/2017 FINDINGS: The heart size and mediastinal contours are within normal limits. Both lungs are clear. The visualized skeletal structures are unremarkable. IMPRESSION: No active cardiopulmonary disease. Electronically Signed   By: Ulyses Jarred M.D.   On: 12/31/2017 01:50    Procedures Procedures (including critical care time)  Medications Ordered in ED Medications  oseltamivir (TAMIFLU) capsule 75 mg (has no administration in time range)     Initial Impression / Assessment and Plan / ED Course  I have reviewed the triage vital signs and the nursing notes.  Pertinent labs & imaging results that were available during my care of the patient were reviewed by me and considered in my medical  decision making (see chart for details).     58 year old male comes in with symptoms that are consistent with flulike illness.  His symptoms started 2 days ago, he is HIV positive therefore we will start him on Tamiflu now.  Flu panel will be sent. X-ray does not show any pneumonia. Patient is tolerating orals at home.  Anticipate discharge.  Final Clinical Impressions(s) / ED Diagnoses   Final diagnoses:  Influenza-like illness    ED Discharge Orders    None       Varney Biles, MD 12/31/17 [phone]

## 2017-12-31 NOTE — ED Triage Notes (Signed)
Pt with generalized body aches, cough, nausea, and vomiting. States was sent home from work yesterday with temp of 101.

## 2017-12-31 NOTE — Discharge Instructions (Signed)
We think that you are having influenza.  We are starting you on Tamiflu. Please take Tylenol, ibuprofen and other over-the-counter medication for symptom control. It is prudent that you hydrate yourself well.  Please return to the ER if your symptoms worsen-  your breathing is getting worse, you are unable to keep any medications down or start getting confused.

## 2018-03-26 ENCOUNTER — Encounter (HOSPITAL_COMMUNITY): Payer: Self-pay | Admitting: Emergency Medicine

## 2018-03-26 ENCOUNTER — Emergency Department (HOSPITAL_COMMUNITY): Payer: PRIVATE HEALTH INSURANCE

## 2018-03-26 ENCOUNTER — Emergency Department (HOSPITAL_COMMUNITY)
Admission: EM | Admit: 2018-03-26 | Discharge: 2018-03-26 | Disposition: A | Discharge status: Home / Self Care | Payer: PRIVATE HEALTH INSURANCE | Attending: Emergency Medicine | Admitting: Emergency Medicine

## 2018-03-26 ENCOUNTER — Other Ambulatory Visit: Payer: Self-pay

## 2018-03-26 DIAGNOSIS — R079 Chest pain, unspecified: Secondary | ICD-10-CM | POA: Insufficient documentation

## 2018-03-26 DIAGNOSIS — I10 Essential (primary) hypertension: Secondary | ICD-10-CM | POA: Insufficient documentation

## 2018-03-26 DIAGNOSIS — Y929 Unspecified place or not applicable: Secondary | ICD-10-CM | POA: Diagnosis not present

## 2018-03-26 DIAGNOSIS — Y9389 Activity, other specified: Secondary | ICD-10-CM | POA: Diagnosis not present

## 2018-03-26 DIAGNOSIS — S301XXA Contusion of abdominal wall, initial encounter: Secondary | ICD-10-CM

## 2018-03-26 DIAGNOSIS — Y999 Unspecified external cause status: Secondary | ICD-10-CM | POA: Insufficient documentation

## 2018-03-26 DIAGNOSIS — S0003XA Contusion of scalp, initial encounter: Secondary | ICD-10-CM | POA: Insufficient documentation

## 2018-03-26 DIAGNOSIS — S299XXA Unspecified injury of thorax, initial encounter: Secondary | ICD-10-CM | POA: Diagnosis present

## 2018-03-26 LAB — POCT I-STAT EG7
ACID-BASE DEFICIT: 2 mmol/L (ref 0.0–2.0)
Bicarbonate: 22.8 mmol/L (ref 20.0–28.0)
CALCIUM ION: 1.06 mmol/L — AB (ref 1.15–1.40)
HCT: 36 % — ABNORMAL LOW (ref 39.0–52.0)
Hemoglobin: 12.2 g/dL — ABNORMAL LOW (ref 13.0–17.0)
O2 SAT: 53 %
Patient temperature: 97.9
Potassium: 2.8 mmol/L — ABNORMAL LOW (ref 3.5–5.1)
Sodium: 144 mmol/L (ref 135–145)
TCO2: 24 mmol/L (ref 22–32)
pCO2, Ven: 39.2 mmHg — ABNORMAL LOW (ref 44.0–60.0)
pH, Ven: 7.371 (ref 7.250–7.430)
pO2, Ven: 28 mmHg — CL (ref 32.0–45.0)

## 2018-03-26 LAB — BASIC METABOLIC PANEL
ANION GAP: 8 (ref 5–15)
BUN: 15 mg/dL (ref 6–20)
CO2: 24 mmol/L (ref 22–32)
Calcium: 8.5 mg/dL — ABNORMAL LOW (ref 8.9–10.3)
Chloride: 106 mmol/L (ref 98–111)
Creatinine, Ser: 0.96 mg/dL (ref 0.61–1.24)
GFR calc Af Amer: >60 mL/min (ref >60)
GFR calc non Af Amer: >60 mL/min (ref >60)
Glucose, Bld: 102 mg/dL — ABNORMAL HIGH (ref 70–99)
Potassium: 3.4 mmol/L — ABNORMAL LOW (ref 3.5–5.1)
Sodium: 138 mmol/L (ref 135–145)

## 2018-03-26 LAB — CBC
HCT: 43.5 % (ref 39.0–52.0)
Hemoglobin: 13.6 g/dL (ref 13.0–17.0)
MCH: 28.5 pg (ref 26.0–34.0)
MCHC: 31.3 g/dL (ref 30.0–36.0)
MCV: 91.2 fL (ref 80.0–100.0)
PLATELETS: 153 10*3/uL (ref 150–400)
RBC: 4.77 MIL/uL (ref 4.22–5.81)
RDW: 12.6 % (ref 11.5–15.5)
WBC: 3.2 10*3/uL — ABNORMAL LOW (ref 4.0–10.5)
nRBC: 0 % (ref 0.0–0.2)

## 2018-03-26 LAB — I-STAT CREATININE, ED: Creatinine, Ser: 0.7 mg/dL (ref 0.61–1.24)

## 2018-03-26 LAB — CBG MONITORING, ED: Glucose-Capillary: 59 mg/dL — ABNORMAL LOW (ref 70–99)

## 2018-03-26 MED ORDER — IOHEXOL 300 MG/ML  SOLN
100.0000 mL | Freq: Once | INTRAMUSCULAR | Status: AC | PRN
  Administered 2018-03-26: 100 mL via INTRAVENOUS

## 2018-03-26 MED ORDER — METHOCARBAMOL 500 MG PO TABS: 500.0000 mg | ORAL_TABLET | Freq: Every evening | ORAL | 0 refills | Status: DC | PRN

## 2018-03-26 MED ORDER — MORPHINE SULFATE (PF) 4 MG/ML IV SOLN
4.0000 mg | Freq: Once | INTRAVENOUS | Status: AC
  Administered 2018-03-26: 4 mg via INTRAVENOUS
  Filled 2018-03-26: qty 1

## 2018-03-26 MED ORDER — DEXTROSE 50 % IV SOLN
1.0000 | Freq: Once | INTRAVENOUS | Status: AC
  Administered 2018-03-26: 50 mL via INTRAVENOUS
  Filled 2018-03-26: qty 50

## 2018-03-26 NOTE — ED Notes (Signed)
Patient removed C collar

## 2018-03-26 NOTE — ED Notes (Signed)
Patient transported to CT 

## 2018-03-26 NOTE — Discharge Instructions (Signed)
Take NSAIDs or Tylenol as needed for the next week. Take this medicine with food. Take muscle relaxer at bedtime to help you sleep. This medicine makes you drowsy so do not take before driving or work Use a heating pad for sore muscles - use for 20 minutes several times a day Return for worsening symptoms

## 2018-03-26 NOTE — ED Notes (Signed)
Dr Pfeiffer informed of EG7 results 

## 2018-03-26 NOTE — ED Notes (Signed)
Patient verbalizes understanding of medications and discharge instructions. No further questions at this time. VSS and patient ambulatory at discharge.   
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
## 2018-04-01 ENCOUNTER — Emergency Department (HOSPITAL_COMMUNITY): Payer: No Typology Code available for payment source

## 2018-04-01 ENCOUNTER — Emergency Department (HOSPITAL_COMMUNITY)
Admission: EM | Admit: 2018-04-01 | Discharge: 2018-04-01 | Disposition: A | Discharge status: Home / Self Care | Payer: No Typology Code available for payment source | Attending: Emergency Medicine | Admitting: Emergency Medicine

## 2018-04-01 ENCOUNTER — Encounter (HOSPITAL_COMMUNITY): Payer: Self-pay | Admitting: Emergency Medicine

## 2018-04-01 ENCOUNTER — Other Ambulatory Visit: Payer: Self-pay

## 2018-04-01 DIAGNOSIS — R0789 Other chest pain: Secondary | ICD-10-CM | POA: Insufficient documentation

## 2018-04-01 DIAGNOSIS — I10 Essential (primary) hypertension: Secondary | ICD-10-CM | POA: Insufficient documentation

## 2018-04-01 DIAGNOSIS — J9811 Atelectasis: Secondary | ICD-10-CM | POA: Diagnosis not present

## 2018-04-01 MED ORDER — HYDROCODONE-ACETAMINOPHEN 5-325 MG PO TABS: 2.0000 | ORAL_TABLET | Freq: Four times a day (QID) | ORAL | 0 refills | Status: DC | PRN
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
## 2018-04-01 NOTE — ED Notes (Signed)
Patient transported to X-ray 

## 2018-04-17 DIAGNOSIS — I77819 Aortic ectasia, unspecified site: Secondary | ICD-10-CM | POA: Insufficient documentation
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
## 2018-04-22 ENCOUNTER — Encounter: Payer: Self-pay | Admitting: Physician Assistant

## 2018-04-22 ENCOUNTER — Other Ambulatory Visit: Payer: Self-pay

## 2018-04-22 ENCOUNTER — Ambulatory Visit (INDEPENDENT_AMBULATORY_CARE_PROVIDER_SITE_OTHER): Payer: Managed Care, Other (non HMO) | Admitting: Physician Assistant

## 2018-04-22 VITALS — BP 140/80 | HR 88 | Ht 71.0 in | Wt 270.0 lb

## 2018-04-22 DIAGNOSIS — I272 Pulmonary hypertension, unspecified: Secondary | ICD-10-CM

## 2018-04-22 DIAGNOSIS — I10 Essential (primary) hypertension: Secondary | ICD-10-CM

## 2018-04-22 DIAGNOSIS — I77819 Aortic ectasia, unspecified site: Secondary | ICD-10-CM

## 2018-04-22 DIAGNOSIS — I480 Paroxysmal atrial fibrillation: Secondary | ICD-10-CM

## 2018-04-22 NOTE — Patient Instructions (Signed)
Medication Instructions: Your physician recommends that you continue on your current medications as directed. Please refer to the Current Medication list given to you today.   Labwork: None today  Procedures/Testing: Your physician has requested that you have an echocardiogram in 1 month . Echocardiography is a painless test that uses sound waves to create images of your heart. It provides your doctor with information about the size and shape of your heart and how well your heart's chambers and valves are working. This procedure takes approximately one hour. There are no restrictions for this procedure.  Take daily blood pressures, follow 2 GM sodium diet  Follow-Up: 2 months with M.Lenze [name]  Any Additional Special Instructions Will Be Listed Below (If Applicable).     If you need a refill on your cardiac medications before your next appointment, please call your pharmacy.

## 2018-04-30 ENCOUNTER — Telehealth: Payer: Self-pay | Admitting: Cardiovascular Disease

## 2018-04-30 ENCOUNTER — Other Ambulatory Visit: Payer: Self-pay

## 2018-04-30 NOTE — Telephone Encounter (Signed)
Returned pt's wife's call. Advised her that there were no medication changes made at [facility]. She voiced understanding.

## 2018-04-30 NOTE — Telephone Encounter (Signed)
Pt's wife called stating the pharmacy has not received his medication. She's not sure of the name, will need to be sent to [facility]

## 2018-05-23 ENCOUNTER — Other Ambulatory Visit (HOSPITAL_COMMUNITY): Payer: Self-pay

## 2018-06-18 ENCOUNTER — Ambulatory Visit (HOSPITAL_COMMUNITY): Admission: RE | Admit: 2018-06-18 | Payer: No Typology Code available for payment source | Source: Ambulatory Visit

## 2018-06-18 ENCOUNTER — Telehealth: Payer: Self-pay

## 2018-06-18 NOTE — Telephone Encounter (Signed)
PT agrees to switch to VV.      Virtual Visit Pre-Appointment Phone Call  "(Name), I am calling you today to discuss your upcoming appointment. We are currently trying to limit exposure to the virus that causes COVID-19 by seeing patients at home rather than in the office."  1. "What is the BEST phone number to call the day of the visit?" - include this in appointment notes  2. "Do you have or have access to (through a family member/friend) a smartphone with video capability that we can use for your visit?" a. If yes - list this number in appt notes as "cell" (if different from BEST phone #) and list the appointment type as a VIDEO visit in appointment notes b. If no - list the appointment type as a PHONE visit in appointment notes  3. Confirm consent - "In the setting of the current Covid19 crisis, you are scheduled for a (phone or video) visit with your provider on (date) at (time).  Just as we do with many in-office visits, in order for you to participate in this visit, we must obtain consent.  If you'd like, I can send this to your mychart (if signed up) or email for you to review.  Otherwise, I can obtain your verbal consent now.  All virtual visits are billed to your insurance company just like a normal visit would be.  By agreeing to a virtual visit, we'd like you to understand that the technology does not allow for your provider to perform an examination, and thus may limit your provider's ability to fully assess your condition. If your provider identifies any concerns that need to be evaluated in person, we will make arrangements to do so.  Finally, though the technology is pretty good, we cannot assure that it will always work on either your or our end, and in the setting of a video visit, we may have to convert it to a phone-only visit.  In either situation, we cannot ensure that we have a secure connection.  Are you willing to proceed?" STAFF: Did the patient verbally acknowledge consent  to telehealth visit? Document YES/NO here: YES   4. Advise patient to be prepared - "Two hours prior to your appointment, go ahead and check your blood pressure, pulse, oxygen saturation, and your weight (if you have the equipment to check those) and write them all down. When your visit starts, your provider will ask you for this information. If you have an Apple Watch or Kardia device, please plan to have heart rate information ready on the day of your appointment. Please have a pen and paper handy nearby the day of the visit as well."  5. Give patient instructions for MyChart download to smartphone OR Doximity/Doxy.me as below if video visit (depending on what platform provider is using)  6. Inform patient they will receive a phone call 15 minutes prior to their appointment time (may be from unknown caller ID) so they should be prepared to answer    TELEPHONE CALL NOTE  Jose Lamb has been deemed a candidate for a follow-up tele-health visit to limit community exposure during the Covid-19 pandemic. I spoke with the patient via phone to ensure availability of phone/video source, confirm preferred email & phone number, and discuss instructions and expectations.  I reminded Jose Lamb to be prepared with any vital sign and/or heart rhythm information that could potentially be obtained via home monitoring, at the time of his visit. I reminded Jose Lamb  to expect a phone call prior to his visit.  Drema Dallas, [place] 06/18/2018 11:42 AM   INSTRUCTIONS FOR DOWNLOADING THE MYCHART APP TO SMARTPHONE  - The patient must first make sure to have activated MyChart and know their login information - If Apple, go to [employer] and type in MyChart in the search bar and download the app. If Android, ask patient to go to [employer] and type in [facility] in the search bar and download the app. The app is free but as with any other app downloads, their phone may require them to verify saved  payment information or Apple/Android password.  - The patient will need to then log into the app with their MyChart username and password, and select [facility] as their healthcare provider to link the account. When it is time for your visit, go to the MyChart app, find appointments, and click Begin Video Visit. Be sure to Select Allow for your device to access the Microphone and Camera for your visit. You will then be connected, and your provider will be with you shortly.  **If they have any issues connecting, or need assistance please contact MyChart service desk [phone])**  **If using a computer, in order to ensure the best quality for their visit they will need to use either of the following Internet Browsers: [employer], or Google Chrome**  IF USING DOXIMITY or DOXY.ME - The patient will receive a link just prior to their visit by text.     FULL LENGTH CONSENT FOR TELE-HEALTH VISIT   I hereby voluntarily request, consent and authorize [facility] and its employed or contracted physicians, physician assistants, nurse practitioners or other licensed health care professionals (the Practitioner), to provide me with telemedicine health care services (the "Services") as deemed necessary by the treating Practitioner. I acknowledge and consent to receive the Services by the Practitioner via telemedicine. I understand that the telemedicine visit will involve communicating with the Practitioner through live audiovisual communication technology and the disclosure of certain medical information by electronic transmission. I acknowledge that I have been given the opportunity to request an in-person assessment or other available alternative prior to the telemedicine visit and am voluntarily participating in the telemedicine visit.  I understand that I have the right to withhold or withdraw my consent to the use of telemedicine in the course of my care at any time, without affecting  my right to future care or treatment, and that the Practitioner or I may terminate the telemedicine visit at any time. I understand that I have the right to inspect all information obtained and/or recorded in the course of the telemedicine visit and may receive copies of available information for a reasonable fee.  I understand that some of the potential risks of receiving the Services via telemedicine include:  [name] Delay or interruption in medical evaluation due to technological equipment failure or disruption; . Information transmitted may not be sufficient (e.g. poor resolution of images) to allow for appropriate medical decision making by the Practitioner; and/or  . In rare instances, security protocols could fail, causing a breach of personal health information.  Furthermore, I acknowledge that it is my responsibility to provide information about my medical history, conditions and care that is complete and accurate to the best of my ability. I acknowledge that Practitioner's advice, recommendations, and/or decision may be based on factors not within their control, such as incomplete or inaccurate data provided by me or distortions of diagnostic images or  specimens that may result from electronic transmissions. I understand that the practice of medicine is not an exact science and that Practitioner makes no warranties or guarantees regarding treatment outcomes. I acknowledge that I will receive a copy of this consent concurrently upon execution via email to the email address I last provided but may also request a printed copy by calling the office of [facility].    I understand that my insurance will be billed for this visit.   I have read or had this consent read to me. . I understand the contents of this consent, which adequately explains the benefits and risks of the Services being provided via telemedicine.  . I have been provided ample opportunity to ask questions regarding this consent and the  Services and have had my questions answered to my satisfaction. . I give my informed consent for the services to be provided through the use of telemedicine in my medical care  By participating in this telemedicine visit I agree to the above.

## 2018-06-24 ENCOUNTER — Telehealth (INDEPENDENT_AMBULATORY_CARE_PROVIDER_SITE_OTHER): Payer: No Typology Code available for payment source | Admitting: Physician Assistant

## 2018-06-24 VITALS — BP 117/83 | Ht 71.0 in | Wt 259.0 lb

## 2018-06-24 DIAGNOSIS — I10 Essential (primary) hypertension: Secondary | ICD-10-CM

## 2018-06-24 DIAGNOSIS — I480 Paroxysmal atrial fibrillation: Secondary | ICD-10-CM

## 2018-06-24 DIAGNOSIS — I77819 Aortic ectasia, unspecified site: Secondary | ICD-10-CM

## 2018-06-24 NOTE — Patient Instructions (Signed)
Medication Instructions:  Your physician recommends that you continue on your current medications as directed. Please refer to the Current Medication list given to you today.   Labwork: NONE  Testing/Procedures: Your physician has requested that you have an echocardiogram. Echocardiography is a painless test that uses sound waves to create images of your heart. It provides your doctor with information about the size and shape of your heart and how well your heart's chambers and valves are working. This procedure takes approximately one hour. There are no restrictions for this procedure.    Follow-Up: Your physician wants you to follow-up in: 6 Months with Dr. Bronson Ing. You will receive a reminder letter in the mail two months in advance. If you don't receive a letter, please call our office to schedule the follow-up appointment.   Any Other Special Instructions Will Be Listed Below (If Applicable).     If you need a refill on your cardiac medications before your next appointment, please call your pharmacy.  Thank you for choosing [facility]!
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
## 2018-07-08 ENCOUNTER — Ambulatory Visit (HOSPITAL_COMMUNITY): Payer: No Typology Code available for payment source | Attending: Physician Assistant

## 2019-01-23 ENCOUNTER — Other Ambulatory Visit (HOSPITAL_COMMUNITY): Payer: Self-pay | Admitting: Family Medicine

## 2019-01-23 ENCOUNTER — Other Ambulatory Visit: Payer: Self-pay | Admitting: Family Medicine

## 2019-01-23 DIAGNOSIS — R591 Generalized enlarged lymph nodes: Secondary | ICD-10-CM

## 2019-01-29 ENCOUNTER — Ambulatory Visit (HOSPITAL_COMMUNITY): Admission: RE | Admit: 2019-01-29 | Payer: No Typology Code available for payment source | Source: Ambulatory Visit

## 2019-02-03 ENCOUNTER — Ambulatory Visit (INDEPENDENT_AMBULATORY_CARE_PROVIDER_SITE_OTHER): Payer: No Typology Code available for payment source | Admitting: Otolaryngology

## 2019-02-03 ENCOUNTER — Other Ambulatory Visit: Payer: Self-pay

## 2019-02-03 ENCOUNTER — Encounter (INDEPENDENT_AMBULATORY_CARE_PROVIDER_SITE_OTHER): Payer: Self-pay | Admitting: Otolaryngology

## 2019-02-03 VITALS — Temp 98.2°F

## 2019-02-03 DIAGNOSIS — D490 Neoplasm of unspecified behavior of digestive system: Secondary | ICD-10-CM

## 2019-02-03 NOTE — Progress Notes (Signed)
HPI: Jose Lamb is a 58 y.o. male who presents is referred by [facility] for evaluation of preauricular nodules.  Patient has noticed these for the past couple of months.  They have gradually gotten larger.  They are nontender.  Patient gives no history of infections.  Gives no history of skin cancers..  Past Medical History:  Diagnosis Date  . 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 Hypertension   . Perforated duodenal ulcer ([place]) 2015   contained, no surg, spontaneous resolution   Past Surgical History:  Procedure Laterality Date  . COLONOSCOPY  Sept 2015   [place]: transverse colon polyp with pathology reporting lymphoid nodule   . ESOPHAGOGASTRODUODENOSCOPY N/A 03/30/2014   Procedure: ESOPHAGOGASTRODUODENOSCOPY (EGD);  Surgeon: Daneil Dolin, MD;  [facility];  Service: Endoscopy;  Laterality: N/A;  215  . None     Social History   Socioeconomic History  . Marital status: Married    Spouse name: Not on file  . Number of children: Not on file  . Years of education: Not on file  . Highest education level: Not on file  Occupational History  . Occupation: Environmental education officer  Tobacco Use  . Smoking status: Never Smoker  . Smokeless tobacco: Never Used  Substance and Sexual Activity  . Alcohol use: No    Alcohol/week: 0.0 standard drinks  . Drug use: No  . Sexual activity: Yes    Partners: Female  Other Topics Concern  . Not on file  Social History Narrative  . Not on file   Social Determinants of Health   Financial Resource Strain:   . Difficulty of Paying Living Expenses: Not on file  Food Insecurity:   . Worried About Charity fundraiser in the Last Year: Not on file  . Ran Out of Food in the Last Year: Not on file  Transportation Needs:   . Lack of Transportation (Medical): Not on file  . Lack of Transportation (Non-Medical): Not on file  Physical Activity:   . Days of Exercise per Week: Not on file  . Minutes of Exercise per Session: Not on  file  Stress:   . Feeling of Stress : Not on file  Social Connections:   . Frequency of Communication with Friends and Family: Not on file  . Frequency of Social Gatherings with Friends and Family: Not on file  . Attends Religious Services: Not on file  . Active Member of Clubs or Organizations: Not on file  . Attends Archivist Meetings: Not on file  . Marital Status: Not on file   Family History  Problem Relation Age of Onset  . Ulcers Mother   . Heart Problems Mother   . Colon cancer Neg Hx    Allergies  Allergen Reactions  . Asa [Aspirin] Other (See Comments)    Nose bleeds   Prior to Admission medications   Not on File     Positive ROS: Otherwise negative  All other systems have been reviewed and were otherwise negative with the exception of those mentioned in the HPI and as above.  Physical Exam: Constitutional: Alert, well-appearing, no acute distress Ears: External ears without lesions or tenderness. Ear canals are clear bilaterally with intact, clear TMs.  Nasal: External nose without lesions. Septum relatively midline.. Clear nasal passages Oral: Lips and gums without lesions. Tongue and palate mucosa without lesions. Posterior oropharynx clear.  Drainage from the parotid ducts is clear bilaterally Neck: No palpable adenopathy or masses  in the neck.  Patient has a approximately 3 cm right preauricular nodule within the right parotid gland.  On palpation this is consistent with probable parotid tumor.  Patient has an identical mass within the left parotid gland in the preauricular area measuring approximately 2 cm. Respiratory: Breathing comfortably  Skin: No facial/neck lesions or rash noted. Neurologic: Normal facial nerve function bilaterally.  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 Pending results of CT scan we will plan either fine-needle aspirate or possible excision. Patient will follow up here following the CT scan of the parotid glands.   Radene Journey, MD   CC:

## 2019-02-05 ENCOUNTER — Other Ambulatory Visit (INDEPENDENT_AMBULATORY_CARE_PROVIDER_SITE_OTHER): Payer: Self-pay

## 2019-02-05 ENCOUNTER — Ambulatory Visit (HOSPITAL_COMMUNITY): Payer: No Typology Code available for payment source

## 2019-02-05 ENCOUNTER — Encounter (HOSPITAL_COMMUNITY): Payer: Self-pay

## 2019-02-05 DIAGNOSIS — K118 Other diseases of salivary glands: Secondary | ICD-10-CM

## 2019-02-06 ENCOUNTER — Other Ambulatory Visit: Payer: Self-pay | Admitting: Podiatry

## 2019-02-06 ENCOUNTER — Encounter: Payer: Self-pay | Admitting: Podiatry

## 2019-02-06 ENCOUNTER — Ambulatory Visit: Payer: No Typology Code available for payment source | Admitting: Podiatry

## 2019-02-06 ENCOUNTER — Ambulatory Visit (INDEPENDENT_AMBULATORY_CARE_PROVIDER_SITE_OTHER): Payer: No Typology Code available for payment source

## 2019-02-06 ENCOUNTER — Other Ambulatory Visit: Payer: Self-pay

## 2019-02-06 VITALS — BP 130/72 | HR 86 | Resp 16

## 2019-02-06 DIAGNOSIS — M722 Plantar fascial fibromatosis: Secondary | ICD-10-CM

## 2019-02-06 DIAGNOSIS — L603 Nail dystrophy: Secondary | ICD-10-CM

## 2019-02-06 DIAGNOSIS — M778 Other enthesopathies, not elsewhere classified: Secondary | ICD-10-CM

## 2019-02-06 MED ORDER — MELOXICAM 15 MG PO TABS: 15.0000 mg | ORAL_TABLET | Freq: Every day | ORAL | 3 refills | Status: DC

## 2019-02-06 MED ORDER — METHYLPREDNISOLONE 4 MG PO TBPK: ORAL_TABLET | ORAL | 0 refills | Status: DC

## 2019-02-06 NOTE — Patient Instructions (Signed)

## 2019-02-06 NOTE — Progress Notes (Signed)
Subjective:  Patient ID: Jose Lamb, male    DOB: Feb 08, 1960,  MRN: [id number] HPI Chief Complaint  Patient presents with  . Foot Pain    Plantar heel and forefoot bilateral (L>R) - aching x 1 year, recently worsened, AM pain, went to ER several months ago and they xrayed left foot and said had a spur, no treatment  . New Patient (Initial Visit)    58 y.o. male presents with the above complaint.   ROS: Denies fever chills nausea vomiting muscle aches pains calf pain back pain chest pain shortness of breath.  Past Medical History:  Diagnosis Date  . 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 Hypertension   . Perforated duodenal ulcer ([place]) 2015   contained, no surg, spontaneous resolution   Past Surgical History:  Procedure Laterality Date  . COLONOSCOPY  Sept 2015   [place]: transverse colon polyp with pathology reporting lymphoid nodule   . ESOPHAGOGASTRODUODENOSCOPY N/A 03/30/2014   Procedure: ESOPHAGOGASTRODUODENOSCOPY (EGD);  Surgeon: Daneil Dolin, MD;  [facility];  Service: Endoscopy;  Laterality: N/A;  215  . None      Current Outpatient Medications:  .  meloxicam (MOBIC) 15 MG tablet, Take 1 tablet (15 mg total) by mouth daily., Disp: 30 tablet, Rfl: 3 .  methylPREDNISolone (MEDROL DOSEPAK) 4 MG TBPK tablet, 6 day dose pack - take as directed, Disp: 21 tablet, Rfl: 0  Allergies  Allergen Reactions  . Asa [Aspirin] Other (See Comments)    Nose bleeds   Review of Systems Objective:   Vitals:   02/06/19 0828  BP: 130/72  Pulse: 86  Resp: 16    General: Well developed, nourished, in no acute distress, alert and oriented x3   Dermatological: Skin is warm, dry and supple bilateral. Nails x 10 are well maintained; remaining integument appears unremarkable at this time. There are no open sores, no preulcerative lesions, no rash or signs of infection present.  Vascular: Dorsalis Pedis artery and Posterior Tibial artery pedal pulses are 2/4 bilateral with  immedate capillary fill time. Pedal hair growth present. No varicosities and no lower extremity edema present bilateral.   Neruologic: Grossly intact via light touch bilateral. Vibratory intact via tuning fork bilateral. Protective threshold with Semmes Wienstein monofilament intact to all pedal sites bilateral. Patellar and Achilles deep tendon reflexes 2+ bilateral. No Babinski or clonus noted bilateral.   Musculoskeletal: No gross boney pedal deformities bilateral. No pain, crepitus, or limitation noted with foot and ankle range of motion bilateral. Muscular strength 5/5 in all groups tested bilateral.  Gait: Unassisted, Nonantalgic.    Radiographs:  Radiographs taken today demonstrate an osseously mature individual with soft tissue swelling in the ankle and the foot.  Plantar distally oriented calcaneal heel spurs are noted.  Soft tissue increase in density plantar skin insertion site is noted left foot with some minor calcification in the plantar fascia.  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  Nail dystrophy bilateral foot.  Tinea pedis bilateral.  Plantar fasciitis bilateral.  Pes planus bilateral.  Plan: Discussed etiology pathology conservative versus surgical therapies at this point I injected the bilateral heels with 20 mg of Kenalog 5 mg of Marcaine point of maximal tenderness.  Tolerated procedure well without complications.  Placed him in a plantar fascial night splint discussed appropriate shoe gear stretching exercises and ice therapy.  Started him on a Medrol Dosepak to be followed by meloxicam.  I also  debrided all reactive hyperkeratotic lesions greater than 3 I also took samples of the skin and nail to be sent for pathologic evaluation.  Also wanted him scheduled with [facility] for orthotics.     Rumeal Cullipher T. Kramer, [place]

## 2019-02-17 ENCOUNTER — Other Ambulatory Visit: Payer: Self-pay

## 2019-02-17 ENCOUNTER — Ambulatory Visit (INDEPENDENT_AMBULATORY_CARE_PROVIDER_SITE_OTHER): Payer: No Typology Code available for payment source | Admitting: Orthotics

## 2019-02-17 DIAGNOSIS — R7303 Prediabetes: Secondary | ICD-10-CM

## 2019-02-17 DIAGNOSIS — M722 Plantar fascial fibromatosis: Secondary | ICD-10-CM

## 2019-03-10 ENCOUNTER — Other Ambulatory Visit: Payer: Self-pay

## 2019-03-10 ENCOUNTER — Ambulatory Visit: Payer: No Typology Code available for payment source | Admitting: Orthotics

## 2019-03-10 DIAGNOSIS — L603 Nail dystrophy: Secondary | ICD-10-CM

## 2019-03-10 DIAGNOSIS — M722 Plantar fascial fibromatosis: Secondary | ICD-10-CM

## 2019-03-10 NOTE — Progress Notes (Signed)
Patient came in today to pick up custom made foot orthotics.  The goals were accomplished and the patient reported no dissatisfaction with said orthotics.  Patient was advised of breakin period and how to report any issues. 

## 2019-03-11 ENCOUNTER — Ambulatory Visit (INDEPENDENT_AMBULATORY_CARE_PROVIDER_SITE_OTHER): Payer: No Typology Code available for payment source | Admitting: Podiatry

## 2019-03-11 ENCOUNTER — Encounter: Payer: Self-pay | Admitting: Podiatry

## 2019-03-11 DIAGNOSIS — L603 Nail dystrophy: Secondary | ICD-10-CM | POA: Diagnosis not present

## 2019-03-11 DIAGNOSIS — Z79899 Other long term (current) drug therapy: Secondary | ICD-10-CM | POA: Diagnosis not present

## 2019-03-11 MED ORDER — TERBINAFINE HCL 250 MG PO TABS: 250.0000 mg | ORAL_TABLET | Freq: Every day | ORAL | 0 refills | Status: DC

## 2019-03-11 NOTE — Patient Instructions (Signed)
Terbinafine tablets What is this medicine? TERBINAFINE (TER bin a feen) is an antifungal medicine. It is used to treat certain kinds of fungal or yeast infections. This medicine may be used for other purposes; ask your health care provider or pharmacist if you have questions. COMMON BRAND NAME(S): Lamisil, Terbinex What should I tell my health care provider before I take this medicine? They need to know if you have any of these conditions:  drink alcoholic beverages  kidney disease  liver disease  an unusual or allergic reaction to terbinafine, other medicines, foods, dyes, or preservatives  pregnant or trying to get pregnant  breast-feeding How should I use this medicine? Take this medicine by mouth with a full glass of water. Follow the directions on the prescription label. You can take this medicine with food or on an empty stomach. Take your medicine at regular intervals. Do not take your medicine more often than directed. Do not skip doses or stop your medicine early even if you feel better. Do not stop taking except on your doctor's advice. Talk to your pediatrician regarding the use of this medicine in children. Special care may be needed. Overdosage: If you think you have taken too much of this medicine contact a poison control center or emergency room at once. NOTE: This medicine is only for you. Do not share this medicine with others. What if I miss a dose? If you miss a dose, take it as soon as you can. If it is almost time for your next dose, take only that dose. Do not take double or extra doses. What may interact with this medicine? Do not take this medicine with any of the following medications:  thioridazine This medicine may also interact with the following medications:  beta-blockers  caffeine  cimetidine  cyclosporine  medicines for depression, anxiety, or psychotic disturbances  medicines for fungal infections like fluconazole and ketoconazole  medicines  for irregular heartbeat like amiodarone, flecainide and propafenone  rifampin  warfarin This list may not describe all possible interactions. Give your health care provider a list of all the medicines, herbs, non-prescription drugs, or dietary supplements you use. Also tell them if you smoke, drink alcohol, or use illegal drugs. Some items may interact with your medicine. What should I watch for while using this medicine? Visit your doctor or health care provider regularly. Tell your doctor right away if you have nausea or vomiting, loss of appetite, stomach pain on your right upper side, yellow skin, dark urine, light stools, or are over tired. Some fungal infections need many weeks or months of treatment to cure. If you are taking this medicine for a long time, you will need to have important blood work done. This medicine may cause serious skin reactions. They can happen weeks to months after starting the medicine. Contact your health care provider right away if you notice fevers or flu-like symptoms with a rash. The rash may be red or purple and then turn into blisters or peeling of the skin. Or, you might notice a red rash with swelling of the face, lips or lymph nodes in your neck or under your arms. What side effects may I notice from receiving this medicine? Side effects that you should report to your doctor or health care professional as soon as possible:  allergic reactions like skin rash or hives, swelling of the face, lips, or tongue  changes in vision  dark urine  fever or infection  general ill feeling or flu-like symptoms    light-colored stools  loss of appetite, nausea  rash, fever, and swollen lymph nodes  redness, blistering, peeling or loosening of the skin, including inside the mouth  right upper belly pain  unusually weak or tired  yellowing of the eyes or skin Side effects that usually do not require medical attention (report to your doctor or health care  professional if they continue or are bothersome):  changes in taste  diarrhea  hair loss  muscle or joint pain  stomach gas  stomach upset This list may not describe all possible side effects. Call your doctor for medical advice about side effects. You may report side effects to FDA at 1-800-FDA-1088. Where should I keep my medicine? Keep out of the reach of children. Store at room temperature below 25 degrees C (77 degrees F). Protect from light. Throw away any unused medicine after the expiration date. NOTE: This sheet is a summary. It may not cover all possible information. If you have questions about this medicine, talk to your doctor, pharmacist, or health care provider.  2020 Elsevier/Gold Standard (2018-05-03 15:37:07)  

## 2019-03-12 LAB — HEPATIC FUNCTION PANEL
AG Ratio: 0.9 (calc) — ABNORMAL LOW (ref 1.0–2.5)
ALT: 24 U/L (ref 9–46)
AST: 22 U/L (ref 10–35)
Albumin: 3.9 g/dL (ref 3.6–5.1)
Alkaline phosphatase (APISO): 63 U/L (ref 35–144)
Bilirubin, Direct: 0.1 mg/dL (ref 0.0–0.2)
Globulin: 4.3 g/dL (calc) — ABNORMAL HIGH (ref 1.9–3.7)
Indirect Bilirubin: 0.3 mg/dL (calc) (ref 0.2–1.2)
Total Bilirubin: 0.4 mg/dL (ref 0.2–1.2)
Total Protein: 8.2 g/dL — ABNORMAL HIGH (ref 6.1–8.1)

## 2019-03-12 NOTE — Progress Notes (Signed)
He presents today for follow-up of his plantar fasciitis he states that is 100% well.  He also wears his orthotics daily and states that he really likes the orthotics.  My feet are feeling much better.  He also presents for the pathology results.  Objective: Vital signs are stable he is alert oriented x3.  Pathology results do demonstrate onychomycosis.  Assessment: Onychomycosis.  100% resolution of plantar fasciitis with orthotics.  Plan: Continue use of the orthotics.  I also recommended oral antifungal's.  We will start him on Lamisil 250 mg tablets 1 p.o. daily x30 and request a liver profile.  Should this come back abnormal he will be notified immediately.  I will follow-up with him otherwise in 1 month.

## 2019-04-17 ENCOUNTER — Ambulatory Visit: Payer: No Typology Code available for payment source | Admitting: Podiatry

## 2019-07-13 ENCOUNTER — Encounter (HOSPITAL_COMMUNITY): Payer: Self-pay | Admitting: Emergency Medicine

## 2019-07-13 ENCOUNTER — Emergency Department (HOSPITAL_COMMUNITY): Payer: No Typology Code available for payment source

## 2019-07-13 ENCOUNTER — Emergency Department (HOSPITAL_COMMUNITY)
Admission: EM | Admit: 2019-07-13 | Discharge: 2019-07-13 | Disposition: A | Discharge status: Home / Self Care | Payer: No Typology Code available for payment source | Attending: Emergency Medicine | Admitting: Emergency Medicine

## 2019-07-13 ENCOUNTER — Other Ambulatory Visit: Payer: Self-pay

## 2019-07-13 DIAGNOSIS — B20 Human immunodeficiency virus [HIV] disease: Secondary | ICD-10-CM | POA: Insufficient documentation

## 2019-07-13 DIAGNOSIS — M79602 Pain in left arm: Secondary | ICD-10-CM

## 2019-07-13 DIAGNOSIS — I10 Essential (primary) hypertension: Secondary | ICD-10-CM | POA: Diagnosis not present

## 2019-07-13 DIAGNOSIS — Z79899 Other long term (current) drug therapy: Secondary | ICD-10-CM | POA: Diagnosis not present

## 2019-07-13 MED ORDER — KETOROLAC TROMETHAMINE 30 MG/ML IJ SOLN
30.0000 mg | Freq: Once | INTRAMUSCULAR | Status: AC
  Administered 2019-07-13: 30 mg via INTRAMUSCULAR
  Filled 2019-07-13: qty 1

## 2019-07-13 NOTE — ED Triage Notes (Signed)
Pt C/O left arm pain after injuring it at work. Pt states he was picking up boxes when he felt his arm pop. Pt reports a burning sensation and numbness in 2 of his fingers.

## 2019-07-13 NOTE — ED Provider Notes (Signed)
[facility] EMERGENCY DEPARTMENT Provider Note   CSN: [id number] Arrival date & time: 07/13/19  0357     History Chief Complaint  Patient presents with  . Arm Pain    Jose Lamb is a 59 y.o. male.  HPI     This is a 59 year old male who presents with left arm pain.  Patient reports he had injury at work over 1 week ago.  He states he was lifting a heavy box when he felt a pop in his left shoulder.  He went to his work's nurse for check.  He states that he did not have any x-rays but was told to do some rehab.  He has been taking naproxen with some relief.  Pain is worse with certain range of motion.  He also reports intermittent numbness in his fourth and fifth digits of the left hand.  He is right-handed.  Currently he rates his pain at 9 out of 10.  He states it is difficult for him to sleep.  Denies radiation of the pain.  It is mostly in the posterior aspect of his left shoulder.  No neck pain or injury.  Past Medical History:  Diagnosis Date  . 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 Hypertension   . Perforated duodenal ulcer ([place]) 2015   contained, no surg, spontaneous resolution    Patient Active Problem List   Diagnosis Date Noted  . Ectatic aorta ([place]) 04/17/2018  . Pre-diabetes 11/26/2014  . Asthma 11/05/2014  . Paroxysmal atrial fibrillation ([place]) 06/30/2014  . Graves' disease 04/01/2014  . Constipation 03/24/2014  . Hypertension 02/06/2014  . Moderate malnutrition ([place]) 02/06/2014  . Peptic ulcer with perforation ([place])   . Duodenal ulcer perforation ([place]) 02/04/2014  . HIV (human immunodeficiency virus infection) ([place]) 02/04/2014  . Perforated duodenal ulcer ([place]) 02/04/2014  . Neuropathic pain 03/13/2013  . Obesity 07/04/2012  . 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 COLONOSCOPY  Sept 2015   [place]: transverse colon polyp with pathology reporting lymphoid nodule   . ESOPHAGOGASTRODUODENOSCOPY N/A 03/30/2014   Procedure: ESOPHAGOGASTRODUODENOSCOPY (EGD);  Surgeon: Daneil Dolin, MD;  Location: [facility];  Service: Endoscopy;  Laterality: N/A;  215  . None         Family History  Problem Relation Age of Onset  . Ulcers Mother   . Heart Problems Mother   . Colon cancer Neg Hx     Social History   Tobacco Use  . Smoking status: Never Smoker  . Smokeless tobacco: Never Used  Substance Use Topics  . Alcohol use: No    Alcohol/week: 0.0 standard drinks  . Drug use: No    Home Medications Prior to Admission medications   Medication Sig Start Date End Date Taking? Authorizing Provider  meloxicam (MOBIC) 15 MG tablet Take 1 tablet (15 mg total) by mouth daily. 02/06/19   Hyatt, Max T, DPM  terbinafine (LAMISIL) 250 MG tablet Take 1 tablet (250 mg total) by mouth daily. 03/11/19   Hyatt, Max T, DPM    Allergies    Asa [aspirin]  Review of Systems   Review of Systems  Constitutional: Negative for fever.  Respiratory: Negative for shortness of breath.   Cardiovascular: Negative for chest pain.  Gastrointestinal: Negative for abdominal pain, nausea and vomiting.  Musculoskeletal:       Left arm and shoulder pain  Skin: Negative for wound.  Neurological: Positive for numbness.  All other  systems reviewed and are negative.   Physical Exam Updated Vital Signs BP (!) 148/93 (BP Location: Right Arm)   Pulse 88   Temp 98.7 F (37.1 C) (Oral)   Resp 17   Wt 123.8 kg   SpO2 96%   BMI 38.08 kg/m   Physical Exam Vitals and nursing note reviewed.  Constitutional:      Appearance: He is well-developed. He is obese. He is not ill-appearing.  HENT:     Head: Normocephalic and atraumatic.     Nose: Nose normal.     Mouth/Throat:     Mouth: Mucous membranes are moist.  Eyes:     Pupils: Pupils are equal, round, and reactive to light.  Cardiovascular:     Rate and Rhythm: Normal rate and regular rhythm.     Heart sounds: Normal heart sounds.  Pulmonary:     Effort: Pulmonary  effort is normal. No respiratory distress.     Breath sounds: Normal breath sounds. No wheezing.  Musculoskeletal:     Cervical back: Neck supple.     Comments: Focused examination of left shoulder without obvious deformity, no tenderness along clavicle or AC joint, there is tenderness to palpation over the posterior shoulder just superior to the scaphoid, patient with full range of motion but pain secondary to range of motion, 2+ radial pulse and neurovascular intact distally  Lymphadenopathy:     Cervical: No cervical adenopathy.  Skin:    General: Skin is warm and dry.  Neurological:     Mental Status: He is alert and oriented to person, place, and time.  Psychiatric:        Mood and Affect: Mood normal.     ED Results / Procedures / Treatments   Labs (all labs ordered are listed, but only abnormal results are displayed) Labs Reviewed - No data to display  EKG None  Radiology DG Shoulder Left  Result Date: 07/13/2019 CLINICAL DATA:  Left arm injury at work. Patient was lifting boxes when he felt his arm pop. Left shoulder pain. EXAM: LEFT SHOULDER - 2+ VIEW COMPARISON:  None. FINDINGS: No fracture or bone lesion. Glenohumeral and AC joints are normally spaced and aligned. Small marginal osteophytes at the [facility] joint with mild subchondral irregularity is consistent with mild osteoarthritis. Soft tissues are unremarkable. IMPRESSION: 1. No fracture or dislocation. 2. Mild AC joint osteoarthritis. Electronically Signed   By: Lajean Manes M.D.   On: 07/13/2019 05:42    Procedures Procedures (including critical care time)  Medications Ordered in ED Medications  ketorolac (TORADOL) 30 MG/ML injection 30 mg (30 mg Intramuscular Given 07/13/19 0459)    ED Course  I have reviewed the triage vital signs and the nursing notes.  Pertinent labs & imaging results that were available during my care of the patient were reviewed by me and considered in my medical decision making (see chart for  details).    MDM Rules/Calculators/A&P                       Patient presents with left arm and shoulder pain.  Overall nontoxic and vital signs are reassuring.  Reports intermittent numbness in the distribution of the ulnar nerve.  No current numbness.  No obvious deformities on exam.  Doubt fracture.  Will obtain an x-ray to examine joint spaces.  X-rays are negative for acute fracture.  Suspect ligamentous or rotator cuff injury.  Recommend ongoing anti-inflammatories and early range of motion exercises.  Follow-up  with orthopedics.  After history, exam, and medical workup I feel the patient has been appropriately medically screened and is safe for discharge home. Pertinent diagnoses were discussed with the patient. Patient was given return precautions.   Final Clinical Impression(s) / ED Diagnoses Final diagnoses:  Left arm pain    Rx / DC Orders ED Discharge Orders    None       Sherod Cisse, Barbette Hair, MD 07/13/19 [phone]

## 2019-07-13 NOTE — Discharge Instructions (Addendum)
You were seen today for ongoing left shoulder pain.  Continue naproxen as needed for pain.  Your x-ray does not show any broken bones.  Follow-up with orthopedics.  Sometimes you  can have a rotator cuff or ligamentous injury.

## 2019-07-14 ENCOUNTER — Telehealth: Payer: Self-pay | Admitting: Orthopedic Surgery

## 2019-07-14 NOTE — Telephone Encounter (Signed)
Jose Lamb called this morning stating he went to the ER last night.  He said he had a Workman's Compensation injury and he wanted to schedule an appointment.  I told him that if this injury is to be covered under workman's compensation, he would have to go through his workplace for them to refer here to this office.  I told him that we may or may not be in the [employer].  He understood and said he would get with them.

## 2019-07-23 ENCOUNTER — Telehealth: Payer: Self-pay | Admitting: Orthopedic Surgery

## 2019-07-23 NOTE — Telephone Encounter (Signed)
Elita Quick from [facility] called this morning and left message. I called her back.  She wants to get Mr. Mcconaghy scheduled here with one of our doctors for a WC shoulder injury.   I have faxed her the [facility] forms that we have here and asked her to fill those out and fax them back.  I told her that once this was done, we would call the patient and schedule an appointment.  Elita Quick at [facility] phone # [phone] and fax # [phone]

## 2019-07-29 ENCOUNTER — Ambulatory Visit: Payer: Self-pay | Admitting: Orthopaedic Surgery

## 2019-07-30 ENCOUNTER — Telehealth: Payer: Self-pay | Admitting: Orthopaedic Surgery

## 2019-07-30 NOTE — Telephone Encounter (Signed)
Jose Lamb missed his appointment on Tuesday, 07/29/19.  I did not hear from him all afternoon so I called him this morning.  He said it was raining so when he left work he just went home.  He said that he thought he would just drop by today.  I told him that he cant just drop by, he has to have an appointment.  I offered an appointment for tomorrow morning but he said no, he has to work.  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 He kept telling me no that it would affect his money.  He said he has to come after 1:00 when he gets off work.  I offered an appointment for this coming Tuesday, 08/05/19 at 1:50 and he was happy with that.  He said he would be here then.   I will call his workman compensation adjustor and let her know.

## 2019-08-05 ENCOUNTER — Ambulatory Visit (INDEPENDENT_AMBULATORY_CARE_PROVIDER_SITE_OTHER): Payer: Worker's Compensation | Admitting: Orthopaedic Surgery

## 2019-08-05 ENCOUNTER — Other Ambulatory Visit: Payer: Self-pay

## 2019-08-05 ENCOUNTER — Encounter: Payer: Self-pay | Admitting: Orthopaedic Surgery

## 2019-08-05 VITALS — BP 121/82 | HR 82 | Ht 71.0 in | Wt 280.0 lb

## 2019-08-05 DIAGNOSIS — M25512 Pain in left shoulder: Secondary | ICD-10-CM

## 2019-08-05 MED ORDER — NAPROXEN 500 MG PO TABS: 500.0000 mg | ORAL_TABLET | Freq: Two times a day (BID) | ORAL | 5 refills | Status: DC

## 2019-08-05 NOTE — Addendum Note (Signed)
Addended by: Derek Mound A on: 08/05/2019 03:36 PM   Modules accepted: Orders

## 2019-08-05 NOTE — Patient Instructions (Signed)
Shoulder Exercises Ask your health care provider which exercises are safe for you. Do exercises exactly as told by your health care provider and adjust them as directed. It is normal to feel mild stretching, pulling, tightness, or discomfort as you do these exercises. Stop right away if you feel sudden pain or your pain gets worse. Do not begin these exercises until told by your health care provider. Stretching exercises External rotation and abduction This exercise is sometimes called corner stretch. This exercise rotates your arm outward (external rotation) and moves your arm out from your body (abduction). 1. Stand in a doorway with one of your feet slightly in front of the other. This is called a staggered stance. If you cannot reach your forearms to the door frame, stand facing a corner of a room. 2. Choose one of the following positions as told by your health care provider: ? Place your hands and forearms on the door frame above your head. ? Place your hands and forearms on the door frame at the height of your head. ? Place your hands on the door frame at the height of your elbows. 3. Slowly move your weight onto your front foot until you feel a stretch across your chest and in the front of your shoulders. Keep your head and chest upright and keep your abdominal muscles tight. 4. Hold for __________ seconds. 5. To release the stretch, shift your weight to your back foot. Repeat __________ times. Complete this exercise __________ times a day. Extension, standing 1. Stand and hold a broomstick, a cane, or a similar object behind your back. ? Your hands should be a little wider than shoulder width apart. ? Your palms should face away from your back. 2. Keeping your elbows straight and your shoulder muscles relaxed, move the stick away from your body until you feel a stretch in your shoulders (extension). ? Avoid shrugging your shoulders while you move the stick. Keep your shoulder blades tucked  down toward the middle of your back. 3. Hold for __________ seconds. 4. Slowly return to the starting position. Repeat __________ times. Complete this exercise __________ times a day. Range-of-motion exercises Pendulum  1. Stand near a wall or a surface that you can hold onto for balance. 2. Bend at the waist and let your left / right arm hang straight down. Use your other arm to support you. Keep your back straight and do not lock your knees. 3. Relax your left / right arm and shoulder muscles, and move your hips and your trunk so your left / right arm swings freely. Your arm should swing because of the motion of your body, not because you are using your arm or shoulder muscles. 4. Keep moving your hips and trunk so your arm swings in the following directions, as told by your health care provider: ? Side to side. ? Forward and backward. ? In clockwise and counterclockwise circles. 5. Continue each motion for __________ seconds, or for as long as told by your health care provider. 6. Slowly return to the starting position. Repeat __________ times. Complete this exercise __________ times a day. Shoulder flexion, standing  1. Stand and hold a broomstick, a cane, or a similar object. Place your hands a little more than shoulder width apart on the object. Your left / right hand should be palm up, and your other hand should be palm down. 2. Keep your elbow straight and your shoulder muscles relaxed. Push the stick up with your healthy arm to   raise your left / right arm in front of your body, and then over your head until you feel a stretch in your shoulder (flexion). ? Avoid shrugging your shoulder while you raise your arm. Keep your shoulder blade tucked down toward the middle of your back. 3. Hold for __________ seconds. 4. Slowly return to the starting position. Repeat __________ times. Complete this exercise __________ times a day. Shoulder abduction, standing 1. Stand and hold a broomstick,  a cane, or a similar object. Place your hands a little more than shoulder width apart on the object. Your left / right hand should be palm up, and your other hand should be palm down. 2. Keep your elbow straight and your shoulder muscles relaxed. Push the object across your body toward your left / right side. Raise your left / right arm to the side of your body (abduction) until you feel a stretch in your shoulder. ? Do not raise your arm above shoulder height unless your health care provider tells you to do that. ? If directed, raise your arm over your head. ? Avoid shrugging your shoulder while you raise your arm. Keep your shoulder blade tucked down toward the middle of your back. 3. Hold for __________ seconds. 4. Slowly return to the starting position. Repeat __________ times. Complete this exercise __________ times a day. Internal rotation  1. Place your left / right hand behind your back, palm up. 2. Use your other hand to dangle an exercise band, a towel, or a similar object over your shoulder. Grasp the band with your left / right hand so you are holding on to both ends. 3. Gently pull up on the band until you feel a stretch in the front of your left / right shoulder. The movement of your arm toward the center of your body is called internal rotation. ? Avoid shrugging your shoulder while you raise your arm. Keep your shoulder blade tucked down toward the middle of your back. 4. Hold for __________ seconds. 5. Release the stretch by letting go of the band and lowering your hands. Repeat __________ times. Complete this exercise __________ times a day. Strengthening exercises External rotation  1. Sit in a stable chair without armrests. 2. Secure an exercise band to a stable object at elbow height on your left / right side. 3. Place a soft object, such as a folded towel or a small pillow, between your left / right upper arm and your body to move your elbow about 4 inches (10 cm) away  from your side. 4. Hold the end of the exercise band so it is tight and there is no slack. 5. Keeping your elbow pressed against the soft object, slowly move your forearm out, away from your abdomen (external rotation). Keep your body steady so only your forearm moves. 6. Hold for __________ seconds. 7. Slowly return to the starting position. Repeat __________ times. Complete this exercise __________ times a day. Shoulder abduction  1. Sit in a stable chair without armrests, or stand up. 2. Hold a __________ weight in your left / right hand, or hold an exercise band with both hands. 3. Start with your arms straight down and your left / right palm facing in, toward your body. 4. Slowly lift your left / right hand out to your side (abduction). Do not lift your hand above shoulder height unless your health care provider tells you that this is safe. ? Keep your arms straight. ? Avoid shrugging your shoulder while you   do this movement. Keep your shoulder blade tucked down toward the middle of your back. 5. Hold for __________ seconds. 6. Slowly lower your arm, and return to the starting position. Repeat __________ times. Complete this exercise __________ times a day. Shoulder extension 1. Sit in a stable chair without armrests, or stand up. 2. Secure an exercise band to a stable object in front of you so it is at shoulder height. 3. Hold one end of the exercise band in each hand. Your palms should face each other. 4. Straighten your elbows and lift your hands up to shoulder height. 5. Step back, away from the secured end of the exercise band, until the band is tight and there is no slack. 6. Squeeze your shoulder blades together as you pull your hands down to the sides of your thighs (extension). Stop when your hands are straight down by your sides. Do not let your hands go behind your body. 7. Hold for __________ seconds. 8. Slowly return to the starting position. Repeat __________ times.  Complete this exercise __________ times a day. Shoulder row 1. Sit in a stable chair without armrests, or stand up. 2. Secure an exercise band to a stable object in front of you so it is at waist height. 3. Hold one end of the exercise band in each hand. Position your palms so that your thumbs are facing the ceiling (neutral position). 4. Bend each of your elbows to a 90-degree angle (right angle) and keep your upper arms at your sides. 5. Step back until the band is tight and there is no slack. 6. Slowly pull your elbows back behind you. 7. Hold for __________ seconds. 8. Slowly return to the starting position. Repeat __________ times. Complete this exercise __________ times a day. Shoulder press-ups  1. Sit in a stable chair that has armrests. Sit upright, with your feet flat on the floor. 2. Put your hands on the armrests so your elbows are bent and your fingers are pointing forward. Your hands should be about even with the sides of your body. 3. Push down on the armrests and use your arms to lift yourself off the chair. Straighten your elbows and lift yourself up as much as you comfortably can. ? Move your shoulder blades down, and avoid letting your shoulders move up toward your ears. ? Keep your feet on the ground. As you get stronger, your feet should support less of your body weight as you lift yourself up. 4. Hold for __________ seconds. 5. Slowly lower yourself back into the chair. Repeat __________ times. Complete this exercise __________ times a day. Wall push-ups  1. Stand so you are facing a stable wall. Your feet should be about one arm-length away from the wall. 2. Lean forward and place your palms on the wall at shoulder height. 3. Keep your feet flat on the floor as you bend your elbows and lean forward toward the wall. 4. Hold for __________ seconds. 5. Straighten your elbows to push yourself back to the starting position. Repeat __________ times. Complete this exercise  __________ times a day. This information is not intended to replace advice given to you by your health care provider. Make sure you discuss any questions you have with your health care provider. Document Revised: 05/17/2018 Document Reviewed: 02/22/2018 Elsevier Patient Education  2020 [employer]  

## 2019-08-05 NOTE — Progress Notes (Signed)
Subjective:    Patient ID: Jose Lamb, male    DOB: 04-29-1960, 59 y.o.   MRN: [id number]  HPI He had an on the job injury on 07-04-2019 around 6:20 am.  He reported it to his supervisor. He was going to warp a full skid when the top box fell to the floor.  It was a very heavy object.  As he was trying to move it, he felt pain and a pop in his left shoulder.  He had pain run down to the left index and long fingers also.  He has had pain in the shoulder since then.  He has no longer any paresthesias.  He was seen in the ER on 07-13-19.  I have reviewed the notes and x-rays.  I have independently reviewed and interpreted x-rays of this patient done at another site by another physician or qualified health professional.  He was given Mobic 15 but is no longer taking it.  He has some more motion in the left shoulder and less pain but still has pain and limited mobility.  He has no neck pain.  He has no right arm/hand pain.    Review of Systems  Constitutional: Positive for activity change.  Musculoskeletal: Positive for arthralgias and myalgias.  All other systems reviewed and are negative.  For Review of Systems, all other systems reviewed and are negative.  The following is a summary of the past history medically, past history surgically, known current medicines, social history and family history.  This information is gathered electronically by the computer from prior information and documentation.  I review this each visit and have found including this information at this point in the chart is beneficial and informative.   Past Medical History:  Diagnosis Date  . 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 Hypertension   . Perforated duodenal ulcer ([place]) 2015   contained, no surg, spontaneous resolution    Past Surgical History:  Procedure Laterality Date  . COLONOSCOPY  Sept 2015   [place]: transverse colon polyp with pathology reporting lymphoid nodule   . ESOPHAGOGASTRODUODENOSCOPY  N/A 03/30/2014   Procedure: ESOPHAGOGASTRODUODENOSCOPY (EGD);  Surgeon: Daneil Dolin, MD;  Location: [facility];  Service: Endoscopy;  Laterality: N/A;  215  . None      Current Outpatient Medications on File Prior to Visit  Medication Sig Dispense Refill  . meloxicam (MOBIC) 15 MG tablet Take 1 tablet (15 mg total) by mouth daily. 30 tablet 3  . terbinafine (LAMISIL) 250 MG tablet Take 1 tablet (250 mg total) by mouth daily. 30 tablet 0   No current facility-administered medications on file prior to visit.    Social History   Socioeconomic History  . Marital status: Married    Spouse name: Not on file  . Number of children: Not on file  . Years of education: Not on file  . Highest education level: Not on file  Occupational History  . Occupation: Environmental education officer  Tobacco Use  . Smoking status: Never Smoker  . Smokeless tobacco: Never Used  Vaping Use  . Vaping Use: Never used  Substance and Sexual Activity  . Alcohol use: No    Alcohol/week: 0.0 standard drinks  . Drug use: No  . Sexual activity: Yes    Partners: Female  Other Topics Concern  . Not on file  Social History Narrative  . Not on file   Social Determinants of Health   Financial Resource Strain:   . Difficulty of  Paying Living Expenses:   Food Insecurity:   . Worried About Charity fundraiser in the Last Year:   . Arboriculturist in the Last Year:   Transportation Needs:   . Film/video editor (Medical):   [name] Lack of Transportation (Non-Medical):   Physical Activity:   . Days of Exercise per Week:   . Minutes of Exercise per Session:   Stress:   . Feeling of Stress :   Social Connections:   . Frequency of Communication with Friends and Family:   . Frequency of Social Gatherings with Friends and Family:   . Attends Religious Services:   . Active Member of Clubs or Organizations:   . Attends Archivist Meetings:   [name] Marital Status:   Intimate Partner Violence:   . Fear of Current or  Ex-Partner:   . Emotionally Abused:   [name] Physically Abused:   . Sexually Abused:     Family History  Problem Relation Age of Onset  . Ulcers Mother   . Heart Problems Mother   . Colon cancer Neg Hx     BP 121/82   Pulse 82   Ht 5\' 11"  (1.803 m)   Wt 280 lb (127 kg)   BMI 39.05 kg/m   Body mass index is 39.05 kg/m. [name]     Objective:   Physical Exam Vitals and nursing note reviewed.  Constitutional:      Appearance: He is well-developed.  HENT:     Head: Normocephalic and atraumatic.  Eyes:     Conjunctiva/sclera: Conjunctivae normal.     Pupils: Pupils are equal, round, and reactive to light.  Cardiovascular:     Rate and Rhythm: Normal rate and regular rhythm.  Pulmonary:     Effort: Pulmonary effort is normal.  Abdominal:     Palpations: Abdomen is soft.  Musculoskeletal:       Arms:     Cervical back: Normal range of motion and neck supple.  Skin:    General: Skin is warm and dry.  Neurological:     Mental Status: He is alert and oriented to person, place, and time.     Cranial Nerves: No cranial nerve deficit.     Motor: No abnormal muscle tone.     Coordination: Coordination normal.     Deep Tendon Reflexes: Reflexes are normal and symmetric. Reflexes normal.  Psychiatric:        Behavior: Behavior normal.        Thought Content: Thought content normal.        Judgment: Judgment normal.           Assessment & Plan:   Encounter Diagnosis  Name Primary?  . Acute pain of left shoulder Yes   I have given sheet of exercises to do.  I will have him begin PT.  I will given Naprosyn 500 po bid pc.  He has taken Aleve with no problem in the past and has taken it recently.  He may need MRI of the shoulder.  I will see in two weeks.  No heavy lifting at work.  Call if any problem.  Precautions discussed.   Electronically Signed Sanjuana Kava, MD 6/29/20213:25 PM

## 2019-08-06 ENCOUNTER — Other Ambulatory Visit: Payer: Self-pay | Admitting: *Deleted

## 2019-08-19 ENCOUNTER — Ambulatory Visit: Payer: Worker's Compensation | Admitting: Orthopaedic Surgery

## 2019-09-15 ENCOUNTER — Emergency Department (HOSPITAL_COMMUNITY): Payer: PRIVATE HEALTH INSURANCE

## 2019-09-15 ENCOUNTER — Encounter (HOSPITAL_COMMUNITY): Payer: Self-pay

## 2019-09-15 ENCOUNTER — Inpatient Hospital Stay (HOSPITAL_COMMUNITY)
Admission: EM | Admit: 2019-09-15 | Discharge: 2019-09-19 | DRG: 177 | Disposition: A | Discharge status: Home / Self Care | Payer: PRIVATE HEALTH INSURANCE | Attending: Internal Medicine | Admitting: Internal Medicine

## 2019-09-15 ENCOUNTER — Other Ambulatory Visit: Payer: Self-pay

## 2019-09-15 DIAGNOSIS — Z21 Asymptomatic human immunodeficiency virus [HIV] infection status: Secondary | ICD-10-CM | POA: Diagnosis present

## 2019-09-15 DIAGNOSIS — R7989 Other specified abnormal findings of blood chemistry: Secondary | ICD-10-CM | POA: Diagnosis present

## 2019-09-15 DIAGNOSIS — E1165 Type 2 diabetes mellitus with hyperglycemia: Secondary | ICD-10-CM | POA: Diagnosis present

## 2019-09-15 DIAGNOSIS — J1282 Pneumonia due to coronavirus disease 2019: Secondary | ICD-10-CM | POA: Diagnosis present

## 2019-09-15 DIAGNOSIS — I480 Paroxysmal atrial fibrillation: Secondary | ICD-10-CM | POA: Diagnosis present

## 2019-09-15 DIAGNOSIS — U071 COVID-19: Principal | ICD-10-CM | POA: Diagnosis present

## 2019-09-15 DIAGNOSIS — I2699 Other pulmonary embolism without acute cor pulmonale: Secondary | ICD-10-CM | POA: Diagnosis present

## 2019-09-15 DIAGNOSIS — Z6835 Body mass index (BMI) 35.0-35.9, adult: Secondary | ICD-10-CM | POA: Diagnosis not present

## 2019-09-15 DIAGNOSIS — I10 Essential (primary) hypertension: Secondary | ICD-10-CM | POA: Diagnosis present

## 2019-09-15 DIAGNOSIS — J9601 Acute respiratory failure with hypoxia: Secondary | ICD-10-CM | POA: Diagnosis present

## 2019-09-15 DIAGNOSIS — Z8711 Personal history of peptic ulcer disease: Secondary | ICD-10-CM | POA: Diagnosis not present

## 2019-09-15 DIAGNOSIS — Z886 Allergy status to analgesic agent status: Secondary | ICD-10-CM

## 2019-09-15 DIAGNOSIS — E1169 Type 2 diabetes mellitus with other specified complication: Secondary | ICD-10-CM | POA: Diagnosis not present

## 2019-09-15 DIAGNOSIS — J069 Acute upper respiratory infection, unspecified: Secondary | ICD-10-CM | POA: Diagnosis not present

## 2019-09-15 DIAGNOSIS — T380X5A Adverse effect of glucocorticoids and synthetic analogues, initial encounter: Secondary | ICD-10-CM | POA: Diagnosis present

## 2019-09-15 DIAGNOSIS — R0602 Shortness of breath: Secondary | ICD-10-CM | POA: Diagnosis present

## 2019-09-15 LAB — C-REACTIVE PROTEIN: CRP: 11.9 mg/dL — ABNORMAL HIGH (ref <1.0)

## 2019-09-15 LAB — FERRITIN: Ferritin: 318 ng/mL (ref 24–336)

## 2019-09-15 LAB — CBG MONITORING, ED
Glucose-Capillary: 169 mg/dL — ABNORMAL HIGH (ref 70–99)
Glucose-Capillary: 187 mg/dL — ABNORMAL HIGH (ref 70–99)
Glucose-Capillary: 207 mg/dL — ABNORMAL HIGH (ref 70–99)

## 2019-09-15 LAB — CBC WITH DIFFERENTIAL/PLATELET
Abs Immature Granulocytes: 0.05 10*3/uL (ref 0.00–0.07)
Basophils Absolute: 0 10*3/uL (ref 0.0–0.1)
Basophils Relative: 0 %
Eosinophils Absolute: 0.1 10*3/uL (ref 0.0–0.5)
Eosinophils Relative: 2 %
HCT: 39.8 % (ref 39.0–52.0)
Hemoglobin: 12.4 g/dL — ABNORMAL LOW (ref 13.0–17.0)
Immature Granulocytes: 1 %
Lymphocytes Relative: 7 %
Lymphs Abs: 0.4 10*3/uL — ABNORMAL LOW (ref 0.7–4.0)
MCH: 29 pg (ref 26.0–34.0)
MCHC: 31.2 g/dL (ref 30.0–36.0)
MCV: 93.2 fL (ref 80.0–100.0)
Monocytes Absolute: 0.4 10*3/uL (ref 0.1–1.0)
Monocytes Relative: 7 %
Neutro Abs: 4.4 10*3/uL (ref 1.7–7.7)
Neutrophils Relative %: 83 %
Platelets: 205 10*3/uL (ref 150–400)
RBC: 4.27 MIL/uL (ref 4.22–5.81)
RDW: 13.1 % (ref 11.5–15.5)
WBC: 5.4 10*3/uL (ref 4.0–10.5)
nRBC: 0 % (ref 0.0–0.2)

## 2019-09-15 LAB — COMPREHENSIVE METABOLIC PANEL
ALT: 43 U/L (ref 0–44)
AST: 26 U/L (ref 15–41)
Albumin: 2.8 g/dL — ABNORMAL LOW (ref 3.5–5.0)
Alkaline Phosphatase: 54 U/L (ref 38–126)
Anion gap: 9 (ref 5–15)
BUN: 14 mg/dL (ref 6–20)
CO2: 28 mmol/L (ref 22–32)
Calcium: 8.3 mg/dL — ABNORMAL LOW (ref 8.9–10.3)
Chloride: 102 mmol/L (ref 98–111)
Creatinine, Ser: 0.8 mg/dL (ref 0.61–1.24)
GFR calc Af Amer: >60 mL/min (ref >60)
GFR calc non Af Amer: >60 mL/min (ref >60)
Glucose, Bld: 137 mg/dL — ABNORMAL HIGH (ref 70–99)
Potassium: 3.5 mmol/L (ref 3.5–5.1)
Sodium: 139 mmol/L (ref 135–145)
Total Bilirubin: 0.6 mg/dL (ref 0.3–1.2)
Total Protein: 8 g/dL (ref 6.5–8.1)

## 2019-09-15 LAB — ABO/RH: ABO/RH(D): O POS

## 2019-09-15 LAB — LACTIC ACID, PLASMA: Lactic Acid, Venous: 1.2 mmol/L (ref 0.5–1.9)

## 2019-09-15 LAB — SARS CORONAVIRUS 2 BY RT PCR (HOSPITAL ORDER, PERFORMED IN ~~LOC~~ HOSPITAL LAB): SARS Coronavirus 2: POSITIVE — AB

## 2019-09-15 LAB — TRIGLYCERIDES: Triglycerides: 92 mg/dL (ref <150)

## 2019-09-15 LAB — GLUCOSE, CAPILLARY: Glucose-Capillary: 201 mg/dL — ABNORMAL HIGH (ref 70–99)

## 2019-09-15 LAB — LACTATE DEHYDROGENASE: LDH: 239 U/L — ABNORMAL HIGH (ref 98–192)

## 2019-09-15 LAB — D-DIMER, QUANTITATIVE: D-Dimer, Quant: >20 ug/mL-FEU — ABNORMAL HIGH (ref 0.00–0.50)

## 2019-09-15 LAB — FIBRINOGEN: Fibrinogen: 689 mg/dL — ABNORMAL HIGH (ref 210–475)

## 2019-09-15 LAB — PROCALCITONIN: Procalcitonin: <0.1 ng/mL

## 2019-09-15 MED ORDER — POLYETHYLENE GLYCOL 3350 17 G PO PACK: 17.0000 g | PACK | Freq: Every day | ORAL | Status: DC | PRN

## 2019-09-15 MED ORDER — MORPHINE SULFATE (PF) 4 MG/ML IV SOLN: 4.0000 mg | Freq: Once | INTRAVENOUS | Status: DC

## 2019-09-15 MED ORDER — SODIUM CHLORIDE 0.9 % IV SOLN
100.0000 mg | Freq: Every day | INTRAVENOUS | Status: AC
  Administered 2019-09-16 – 2019-09-19 (×4): 100 mg via INTRAVENOUS
  Filled 2019-09-15 (×5): qty 20

## 2019-09-15 MED ORDER — ONDANSETRON HCL 4 MG PO TABS: 4.0000 mg | ORAL_TABLET | Freq: Four times a day (QID) | ORAL | Status: DC | PRN

## 2019-09-15 MED ORDER — SODIUM CHLORIDE 0.9% FLUSH
3.0000 mL | Freq: Two times a day (BID) | INTRAVENOUS | Status: DC
  Administered 2019-09-15 – 2019-09-19 (×8): 3 mL via INTRAVENOUS

## 2019-09-15 MED ORDER — ALBUTEROL SULFATE HFA 108 (90 BASE) MCG/ACT IN AERS
2.0000 | INHALATION_SPRAY | Freq: Four times a day (QID) | RESPIRATORY_TRACT | Status: DC
  Administered 2019-09-15 – 2019-09-16 (×6): 2 via RESPIRATORY_TRACT
  Filled 2019-09-15: qty 6.7

## 2019-09-15 MED ORDER — INSULIN ASPART 100 UNIT/ML ~~LOC~~ SOLN
0.0000 [IU] | Freq: Three times a day (TID) | SUBCUTANEOUS | Status: DC
  Administered 2019-09-15: 2 [IU] via SUBCUTANEOUS
  Administered 2019-09-15: 1 [IU] via SUBCUTANEOUS
  Administered 2019-09-16: 2 [IU] via SUBCUTANEOUS
  Administered 2019-09-16 – 2019-09-17 (×3): 1 [IU] via SUBCUTANEOUS
  Administered 2019-09-18: 2 [IU] via SUBCUTANEOUS
  Administered 2019-09-18: 1 [IU] via SUBCUTANEOUS
  Filled 2019-09-15 (×2): qty 1

## 2019-09-15 MED ORDER — ADULT MULTIVITAMIN W/MINERALS CH
1.0000 | ORAL_TABLET | Freq: Every day | ORAL | Status: DC
  Administered 2019-09-15 – 2019-09-19 (×5): 1 via ORAL
  Filled 2019-09-15 (×5): qty 1

## 2019-09-15 MED ORDER — SODIUM CHLORIDE 0.9 % IV SOLN
100.0000 mg | INTRAVENOUS | Status: AC
  Administered 2019-09-15 (×2): 100 mg via INTRAVENOUS
  Filled 2019-09-15 (×2): qty 20

## 2019-09-15 MED ORDER — ZINC SULFATE 220 (50 ZN) MG PO CAPS
220.0000 mg | ORAL_CAPSULE | Freq: Every day | ORAL | Status: DC
  Administered 2019-09-15 – 2019-09-19 (×5): 220 mg via ORAL
  Filled 2019-09-15 (×5): qty 1

## 2019-09-15 MED ORDER — ONDANSETRON HCL 4 MG/2ML IJ SOLN: 4.0000 mg | Freq: Four times a day (QID) | INTRAMUSCULAR | Status: DC | PRN

## 2019-09-15 MED ORDER — ONDANSETRON HCL 4 MG/2ML IJ SOLN
4.0000 mg | Freq: Once | INTRAMUSCULAR | Status: AC
  Administered 2019-09-15: 4 mg via INTRAVENOUS
  Filled 2019-09-15: qty 2

## 2019-09-15 MED ORDER — PANTOPRAZOLE SODIUM 40 MG PO TBEC
40.0000 mg | DELAYED_RELEASE_TABLET | Freq: Every day | ORAL | Status: DC
  Administered 2019-09-15 – 2019-09-19 (×5): 40 mg via ORAL
  Filled 2019-09-15 (×5): qty 1

## 2019-09-15 MED ORDER — ACETAMINOPHEN 650 MG RE SUPP: 650.0000 mg | Freq: Four times a day (QID) | RECTAL | Status: DC | PRN

## 2019-09-15 MED ORDER — TRAZODONE HCL 50 MG PO TABS: 50.0000 mg | ORAL_TABLET | Freq: Every evening | ORAL | Status: DC | PRN

## 2019-09-15 MED ORDER — DEXAMETHASONE SODIUM PHOSPHATE 10 MG/ML IJ SOLN
10.0000 mg | Freq: Once | INTRAMUSCULAR | Status: AC
  Administered 2019-09-15: 10 mg via INTRAVENOUS
  Filled 2019-09-15: qty 1

## 2019-09-15 MED ORDER — HEPARIN SODIUM (PORCINE) 5000 UNIT/ML IJ SOLN: 5000.0000 [IU] | Freq: Three times a day (TID) | INTRAMUSCULAR | Status: DC

## 2019-09-15 MED ORDER — SODIUM CHLORIDE 0.9 % IV SOLN: 250.0000 mL | INTRAVENOUS | Status: DC | PRN

## 2019-09-15 MED ORDER — ALBUTEROL SULFATE (2.5 MG/3ML) 0.083% IN NEBU: 2.5000 mg | INHALATION_SOLUTION | RESPIRATORY_TRACT | Status: DC | PRN

## 2019-09-15 MED ORDER — INSULIN ASPART 100 UNIT/ML ~~LOC~~ SOLN
0.0000 [IU] | Freq: Every day | SUBCUTANEOUS | Status: DC
  Administered 2019-09-15 – 2019-09-17 (×2): 2 [IU] via SUBCUTANEOUS

## 2019-09-15 MED ORDER — ENOXAPARIN SODIUM 60 MG/0.6ML ~~LOC~~ SOLN
60.0000 mg | SUBCUTANEOUS | Status: DC
  Administered 2019-09-15: 60 mg via SUBCUTANEOUS
  Filled 2019-09-15: qty 0.6

## 2019-09-15 MED ORDER — METOPROLOL TARTRATE 25 MG PO TABS: 25.0000 mg | ORAL_TABLET | Freq: Three times a day (TID) | ORAL | Status: DC | PRN

## 2019-09-15 MED ORDER — SODIUM CHLORIDE 0.9% FLUSH: 3.0000 mL | INTRAVENOUS | Status: DC | PRN

## 2019-09-15 MED ORDER — METHYLPREDNISOLONE SODIUM SUCC 125 MG IJ SOLR
0.5000 mg/kg | Freq: Two times a day (BID) | INTRAMUSCULAR | Status: DC
  Administered 2019-09-15 – 2019-09-19 (×8): 63.75 mg via INTRAVENOUS
  Filled 2019-09-15 (×8): qty 2

## 2019-09-15 MED ORDER — GUAIFENESIN-DM 100-10 MG/5ML PO SYRP
10.0000 mL | ORAL_SOLUTION | ORAL | Status: DC | PRN
  Administered 2019-09-18: 10 mL via ORAL
  Filled 2019-09-15: qty 10

## 2019-09-15 MED ORDER — ASCORBIC ACID 500 MG PO TABS
500.0000 mg | ORAL_TABLET | Freq: Every day | ORAL | Status: DC
  Administered 2019-09-15 – 2019-09-19 (×5): 500 mg via ORAL
  Filled 2019-09-15 (×5): qty 1

## 2019-09-15 MED ORDER — ACETAMINOPHEN 325 MG PO TABS: 650.0000 mg | ORAL_TABLET | Freq: Four times a day (QID) | ORAL | Status: DC | PRN

## 2019-09-15 MED ORDER — MORPHINE SULFATE (PF) 2 MG/ML IV SOLN
INTRAVENOUS | Status: AC
  Administered 2019-09-15: 4 mg via INTRAVENOUS
  Filled 2019-09-15: qty 2

## 2019-09-15 MED ORDER — HYDROCOD POLST-CPM POLST ER 10-8 MG/5ML PO SUER
5.0000 mL | Freq: Two times a day (BID) | ORAL | Status: DC | PRN
  Administered 2019-09-15 – 2019-09-18 (×3): 5 mL via ORAL
  Filled 2019-09-15 (×3): qty 5

## 2019-09-15 NOTE — ED Notes (Signed)
ED TO INPATIENT HANDOFF REPORT  ED Nurse Name and Phone #:   Fabio Neighbors RN [phone]  S Name/Age/Gender Jose Lamb 59 y.o. male Room/Bed: APA09/APA09  Code Status   Code Status: Full Code  Home/SNF/Other Home Patient oriented to: self, place, time and situation Is this baseline? Yes   Triage Complete: Triage complete  Chief Complaint Acute respiratory disease due to COVID-19 virus [U07.1, J06.9]  Triage Note Pt reports having vaccine in April, pt and his family were diagnosed with covid about one week ago. Increased shortness of breath and productive cough reported. Pt gets winded with exertion.     Allergies Allergies  Allergen Reactions  . Asa [Aspirin] Other (See Comments)    Nose bleeds    Level of Care/Admitting Diagnosis ED Disposition    ED Disposition Condition [place]: [facility] [[id number]]  Level of Care: Telemetry [5]  Covid Evaluation: Confirmed COVID Positive  Diagnosis: Acute respiratory disease due to COVID-19 virus [[id number]]  Admitting Physician: Morrison Old  Attending Physician: Morrison Old  Estimated length of stay: 3 - 4 days  Certification:: I certify this patient will need inpatient services for at least 2 midnights  Bed request comments: Tele       B Medical/Surgery History Past Medical History:  Diagnosis Date  . 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 Hypertension   . Perforated duodenal ulcer ([place]) 2015   contained, no surg, spontaneous resolution   Past Surgical History:  Procedure Laterality Date  . COLONOSCOPY  Sept 2015   [place]: transverse colon polyp with pathology reporting lymphoid nodule   . ESOPHAGOGASTRODUODENOSCOPY N/A 03/30/2014   Procedure: ESOPHAGOGASTRODUODENOSCOPY (EGD);  Surgeon: Daneil Dolin, MD;  Location: [facility]O SUITE;  Service: Endoscopy;  Laterality: N/A;  215  . None       A IV Location/Drains/Wounds Patient Lines/Drains/Airways  Status    Active Line/Drains/Airways    Name Placement date Placement time Site Days   Peripheral IV 09/15/19 Right Antecubital 09/15/19  0846  Antecubital  less than 1          Intake/Output Last 24 hours  Intake/Output Summary (Last 24 hours) at 09/15/2019 1828 Last data filed at 09/15/2019 1324 Gross per 24 hour  Intake 200 ml  Output --  Net 200 ml    Labs/Imaging Results for orders placed or performed during the hospital encounter of 09/15/19 (from the past 48 hour(s))  SARS Coronavirus 2 by RT PCR (hospital order, performed in [facility] hospital lab) Nasopharyngeal Nasopharyngeal Swab     Status: Abnormal   Collection Time: 09/15/19  6:41 AM   Specimen: Nasopharyngeal Swab  Result Value Ref Range   SARS Coronavirus 2 POSITIVE (A) NEGATIVE    Comment: CRITICAL RESULT CALLED TO, READ BACK BY AND VERIFIED WITH: REBBECCA MENTOR,RN @1005  08/09/2021kay (NOTE) SARS-CoV-2 target nucleic acids are DETECTED  SARS-CoV-2 RNA is generally detectable in upper respiratory specimens  during the acute phase of infection.  Positive results are indicative  of the presence of the identified virus, but do not rule out bacterial infection or co-infection with other pathogens not detected by the test.  Clinical correlation with patient history and  other diagnostic information is necessary to determine patient infection status.  The expected result is negative.  Fact Sheet for Patients:   StrictlyIdeas.no   Fact Sheet for Healthcare Providers:   BankingDealers.co.za    This test is not yet approved or cleared by the [place]  States FDA and  has been authorized for detection and/or diagnosis of SARS-CoV-2 by FDA under an Emergency Use Authorization (EUA).  This EUA will remain in effect (me aning this test can be used) for the duration of  the COVID-19 declaration under Section 564(b)(1) of the Act, 21 U.S.C. section 360-bbb-3(b)(1), unless  the authorization is terminated or revoked sooner.  Performed at [facility], [address], [place], [place] [address]   Lactic acid, plasma     Status: None   Collection Time: 09/15/19  7:04 AM  Result Value Ref Range   Lactic Acid, Venous 1.2 0.5 - 1.9 mmol/L    Comment: Performed at [facility], [address], [place], [place] [address]  Blood Culture (routine x 2)     Status: None (Preliminary result)   Collection Time: 09/15/19  7:04 AM   Specimen: Peripheral; Blood  Result Value Ref Range   Specimen Description BLOOD RIGHT ANTECUBITAL    Special Requests      BOTTLES DRAWN AEROBIC AND ANAEROBIC Blood Culture adequate volume Performed at [facility], [address] [place], [place] [address]    Culture PENDING    Report Status PENDING   CBC WITH DIFFERENTIAL     Status: Abnormal   Collection Time: 09/15/19  7:05 AM  Result Value Ref Range   WBC 5.4 4.0 - 10.5 K/uL   RBC 4.27 4.22 - 5.81 MIL/uL   Hemoglobin 12.4 (L) 13.0 - 17.0 g/dL   HCT 39.8 39 - 52 %   MCV 93.2 80.0 - 100.0 fL   MCH 29.0 26.0 - 34.0 pg   MCHC 31.2 30.0 - 36.0 g/dL   RDW 13.1 11.5 - 15.5 %   Platelets 205 150 - 400 K/uL   nRBC 0.0 0.0 - 0.2 %   Neutrophils Relative % 83 %   Neutro Abs 4.4 1.7 - 7.7 K/uL   Lymphocytes Relative 7 %   Lymphs Abs 0.4 (L) 0.7 - 4.0 K/uL   Monocytes Relative 7 %   Monocytes Absolute 0.4 0 - 1 K/uL   Eosinophils Relative 2 %   Eosinophils Absolute 0.1 0 - 0 K/uL   Basophils Relative 0 %   Basophils Absolute 0.0 0 - 0 K/uL   Immature Granulocytes 1 %   Abs Immature Granulocytes 0.05 0.00 - 0.07 K/uL    Comment: Performed at [facility], [address], [place], [place] [address]  Comprehensive metabolic panel     Status: Abnormal   Collection Time: 09/15/19  7:05 AM  Result Value Ref Range   Sodium 139 135 - 145 mmol/L   Potassium 3.5 3.5 - 5.1 mmol/L   Chloride 102 98 - 111 mmol/L   CO2 28 22 - 32 mmol/L   Glucose, Bld 137 (H) 70 - 99 mg/dL    Comment:  Glucose reference range applies only to samples taken after fasting for at least 8 hours.   BUN 14 6 - 20 mg/dL   Creatinine, Ser 0.80 0.61 - 1.24 mg/dL   Calcium 8.3 (L) 8.9 - 10.3 mg/dL   Total Protein 8.0 6.5 - 8.1 g/dL   Albumin 2.8 (L) 3.5 - 5.0 g/dL   AST 26 15 - 41 U/L   ALT 43 0 - 44 U/L   Alkaline Phosphatase 54 38 - 126 U/L   Total Bilirubin 0.6 0.3 - 1.2 mg/dL   GFR calc non Af Amer >60 >60 mL/min   GFR calc Af Amer >60 >60 mL/min  Anion gap 9 5 - 15    Comment: Performed at [facility], [address], [place], [place] [address]  D-dimer, quantitative     Status: Abnormal   Collection Time: 09/15/19  7:05 AM  Result Value Ref Range   D-Dimer, Quant >20.00 (H) 0.00 - 0.50 ug/mL-FEU    Comment: (NOTE) At the manufacturer cut-off of 0.50 ug/mL FEU, this assay has been documented to exclude PE with a sensitivity and negative predictive value of 97 to 99%.  At this time, this assay has not been approved by the FDA to exclude DVT/VTE. Results should be correlated with clinical presentation. Performed at [facility], [address], [place], [place] [address]   Procalcitonin     Status: None   Collection Time: 09/15/19  7:05 AM  Result Value Ref Range   Procalcitonin <0.10 ng/mL    Comment:        Interpretation: PCT (Procalcitonin) <= 0.5 ng/mL: Systemic infection (sepsis) is not likely. Local bacterial infection is possible. (NOTE)       Sepsis PCT Algorithm           Lower Respiratory Tract                                      Infection PCT Algorithm    ----------------------------     ----------------------------         PCT < 0.25 ng/mL                PCT < 0.10 ng/mL          Strongly encourage             Strongly discourage   discontinuation of antibiotics    initiation of antibiotics    ----------------------------     -----------------------------       PCT 0.25 - 0.50 ng/mL            PCT 0.10 - 0.25 ng/mL               OR       >80% decrease in PCT             Discourage initiation of                                            antibiotics      Encourage discontinuation           of antibiotics    ----------------------------     -----------------------------         PCT >= 0.50 ng/mL              PCT 0.26 - 0.50 ng/mL               AND        <80% decrease in PCT             Encourage initiation of                                             antibiotics       Encourage continuation           of antibiotics    ----------------------------     -----------------------------  PCT >= 0.50 ng/mL                  PCT > 0.50 ng/mL               AND         increase in PCT                  Strongly encourage                                      initiation of antibiotics    Strongly encourage escalation           of antibiotics                                     -----------------------------                                           PCT <= 0.25 ng/mL                                                 OR                                        > 80% decrease in PCT                                      Discontinue / Do not initiate                                             antibiotics  Performed at [facility], [address] [place], [place] [address]   Lactate dehydrogenase     Status: Abnormal   Collection Time: 09/15/19  7:05 AM  Result Value Ref Range   LDH 239 (H) 98 - 192 U/L    Comment: Performed at [facility], [address], [place], [place] [address]  Ferritin     Status: None   Collection Time: 09/15/19  7:05 AM  Result Value Ref Range   Ferritin 318 24 - 336 ng/mL    Comment: Performed at [name], [address] [place], [place] [address]  Triglycerides     Status: None   Collection Time: 09/15/19  7:05 AM  Result Value Ref Range   Triglycerides 92 <150 mg/dL    Comment: Performed at [facility], [address], [place], [place] [address]  Fibrinogen     Status: Abnormal   Collection Time: 09/15/19  7:05 AM   Result Value Ref Range   Fibrinogen 689 (H) 210 - 475 mg/dL    Comment: Performed at [facility], [address], [place], [place] [address]  C-reactive protein     Status: Abnormal   Collection Time: 09/15/19  7:05 AM  Result Value Ref Range   CRP 11.9 (H) <1.0 mg/dL    Comment: Performed at [facility], [address], [place], [place] [address]  Blood Culture (routine x 2)     Status: None (Preliminary result)   Collection Time: 09/15/19  7:13 AM   Specimen: Peripheral; Blood  Result Value Ref Range   Specimen Description BLOOD LEFT HAND    Special Requests      BOTTLES DRAWN AEROBIC AND ANAEROBIC Blood Culture adequate volume Performed at [facility], [address] [place], [place] [address]    Culture PENDING    Report Status PENDING   ABO/Rh     Status: None   Collection Time: 09/15/19  9:07 AM  Result Value Ref Range   ABO/RH(D)      Jenetta Downer POS Performed at [facility], [address], [place], [place] [address]   CBG monitoring, ED     Status: Abnormal   Collection Time: 09/15/19  1:06 PM  Result Value Ref Range   Glucose-Capillary 169 (H) 70 - 99 mg/dL    Comment: Glucose reference range applies only to samples taken after fasting for at least 8 hours.  CBG monitoring, ED     Status: Abnormal   Collection Time: 09/15/19  1:52 PM  Result Value Ref Range   Glucose-Capillary 187 (H) 70 - 99 mg/dL    Comment: Glucose reference range applies only to samples taken after fasting for at least 8 hours.  CBG monitoring, ED     Status: Abnormal   Collection Time: 09/15/19  4:59 PM  Result Value Ref Range   Glucose-Capillary 207 (H) 70 - 99 mg/dL    Comment: Glucose reference range applies only to samples taken after fasting for at least 8 hours.   Comment 1 Call MD NNP PA CNM    DG Chest Port 1 View  Result Date: 09/15/2019 CLINICAL DATA:  Cough, shortness of breath EXAM: PORTABLE CHEST 1 VIEW COMPARISON:  04/01/2018 FINDINGS: There are bilateral interstitial and patchy  alveolar airspace opacities. There is no pleural effusion or pneumothorax. The heart and mediastinal contours are unremarkable. There is no acute osseous abnormality. IMPRESSION: Bilateral interstitial and patchy alveolar airspace opacities concerning for mild interstitial edema versus interstitial infection. Electronically Signed   By: Kathreen Devoid   On: 09/15/2019 07:26    Pending Labs Unresulted Labs (From admission, onward) Comment          Start     Ordered   09/16/19 0500  CBC with Differential/Platelet  Daily,   R      09/15/19 0843   09/16/19 0500  Comprehensive metabolic panel  Daily,   R      09/15/19 0843   09/16/19 0500  C-reactive protein  Daily,   R      09/15/19 0843   09/16/19 0500  D-dimer, quantitative (not at [facility])  Daily,   R      09/15/19 0843   09/16/19 0500  Magnesium  Daily,   R      09/15/19 0843   09/16/19 0500  Phosphorus  Daily,   R      09/15/19 0843   09/16/19 0500  Ferritin  Daily,   R      09/15/19 0843          Vitals/Pain Today's Vitals   09/15/19 1730 09/15/19 1748 09/15/19 1801 09/15/19 1806  BP: 119/79 118/79 119/65   Pulse: (!) 104 (!) 104 (!) 102 100  Resp:  [name])  22    Temp:  99 F (37.2 C)    TempSrc:  Oral    SpO2: 91% 93% 90% 94%  Weight:      Height:      PainSc:  8       Isolation Precautions Airborne and Contact precautions  Medications Medications  remdesivir 100 mg in sodium chloride 0.9 % 100 mL IVPB (has no administration in time range)  morphine 4 MG/ML injection 4 mg (4 mg Intravenous Not Given 09/15/19 1004)  insulin aspart (novoLOG) injection 0-6 Units (2 Units Subcutaneous Given 09/15/19 1747)  insulin aspart (novoLOG) injection 0-5 Units (has no administration in time range)  sodium chloride flush (NS) 0.9 % injection 3 mL (3 mLs Intravenous Not Given 09/15/19 1004)  sodium chloride flush (NS) 0.9 % injection 3 mL (has no administration in time range)  0.9 %  sodium chloride infusion (has no administration in time  range)  traZODone (DESYREL) tablet 50 mg (has no administration in time range)  polyethylene glycol (MIRALAX / GLYCOLAX) packet 17 g (has no administration in time range)  ondansetron (ZOFRAN) tablet 4 mg (has no administration in time range)    Or  ondansetron (ZOFRAN) injection 4 mg (has no administration in time range)  albuterol (PROVENTIL) (2.5 MG/3ML) 0.083% nebulizer solution 2.5 mg (has no administration in time range)  multivitamin with minerals tablet 1 tablet (1 tablet Oral Given 09/15/19 0943)  acetaminophen (TYLENOL) tablet 650 mg (has no administration in time range)    Or  acetaminophen (TYLENOL) suppository 650 mg (has no administration in time range)  albuterol (VENTOLIN HFA) 108 (90 Base) MCG/ACT inhaler 2 puff (2 puffs Inhalation Given 09/15/19 1318)  methylPREDNISolone sodium succinate (SOLU-MEDROL) 125 mg/2 mL injection 63.75 mg (63.75 mg Intravenous Not Given 09/15/19 0959)  guaiFENesin-dextromethorphan (ROBITUSSIN DM) 100-10 MG/5ML syrup 10 mL (has no administration in time range)  chlorpheniramine-HYDROcodone (TUSSIONEX) 10-8 MG/5ML suspension 5 mL (5 mLs Oral Given 09/15/19 0943)  ascorbic acid (VITAMIN C) tablet 500 mg (500 mg Oral Given 09/15/19 0944)  zinc sulfate capsule 220 mg (220 mg Oral Given 09/15/19 0943)  pantoprazole (PROTONIX) EC tablet 40 mg (40 mg Oral Given 09/15/19 0944)  dexamethasone (DECADRON) injection 10 mg (10 mg Intravenous Given 09/15/19 0848)  remdesivir 100 mg in sodium chloride 0.9 % 100 mL IVPB (0 mg Intravenous Stopped 09/15/19 1324)  ondansetron (ZOFRAN) injection 4 mg (4 mg Intravenous Given 09/15/19 0848)  morphine 2 MG/ML injection (4 mg Intravenous Given 09/15/19 0943)    Mobility walks Low fall risk   Focused Assessments    R Recommendations: See Admitting Provider Note  Report given to:   Additional Notes:

## 2019-09-15 NOTE — ED Notes (Signed)
CRITICAL VALUE ALERT  Critical Value:  Covid Positive  Date & Time Notied:  09/15/19, 1007  Provider Notified: Dr. Phineas Semen  Orders Received/Actions taken: no new orders

## 2019-09-15 NOTE — ED Provider Notes (Signed)
Blood pressure 114/77, pulse 93, temperature 98 F (36.7 C), temperature source Oral, resp. rate (!) 25, height 5\' 11"  (1.803 m), weight 127 kg, SpO2 94 %.  Assuming care from Dr. Betsey Holiday.  In short, Jose Lamb is a 59 y.o. male with a chief complaint of Shortness of Breath .  Refer to the original H&P for additional details.  The current plan of care is to f/u on labs and imaging. Will need admit with COVID PNA and new O2 requirement.  08:00 AM  Patient reevaluated after lab work resulting.  He was diagnosed with Covid after testing 6 days ago as an outpatient.  He does not have the results with him and so have repeated the PCR test here.  He does have bilateral infiltrates on chest x-ray.  He was vaccinated with the [employer] vaccine in April. 2L [place] O2 requirement currently.   Discussed patient's case with [facility], Dr. [name] to request admission. Patient and family (if present) updated with plan. Care transferred to [facility] service.  I reviewed all nursing notes, vitals, pertinent old records, EKGs, labs, imaging (as available).   Margette Fast, MD 09/15/19 [phone]

## 2019-09-15 NOTE — ED Notes (Signed)
Pt in bed, pt satting 89 on [place], increased O2 to 5L O2 via [place], pt satting 92% on 5 L, pt requests alb puffer, 2 puffs given with spacer.

## 2019-09-15 NOTE — H&P (Signed)
Patient Demographics:    Jose Lamb, is a 59 y.o. male  MRN: [id number]   DOB - 05/03/1960  Admit Date - 09/15/2019  Outpatient Primary MD for the patient is [name], [name]   Assessment & Plan:    Principal Problem:   Acute respiratory disease due to COVID-19 virus Active Problems:   Pneumonia due to COVID-19 virus   Hypertension   Paroxysmal atrial fibrillation ([place])   1)Acute hypoxic respiratory failure secondary to COVID-19 infection/pneumonia--- The treatment plan and use of medications  for treatment of COVID-19 infection and possible side effects were discussed with patient,   -Currently requiring greater than 3 L of oxygen via nasal cannula --Patient verbalizes understanding and agrees to treatment protocols   --Patient is positive for COVID-19 infection, chest x-ray with findings of infiltrates/opacities,  patient is hypoxic and requiring continuous supplemental oxygen---patient meets criteria for initiation of Remdesivir AND  Steroid therapy per protocol  -WBC 5.4, LFTs WNL, PCT less than 0.10, ferritin 318 -D-dimer greater than 20 -LDH 239, fibrinogen 689, CRP 11.9, chest x-ray consistent with Covid pneumonia --Check and trend inflammatory markers including CRP,  d-dimer  ferritin and LFTs  --Supplemental oxygen to keep O2 sats above 93% -Follow serial chest x-rays and ABGs as indicated --- Encourage prone positioning for More than 16 hours/day in increments of 2 to 3 hours at a time if able to tolerate --Attempt to maintain euvolemic state --Zinc and vitamin C as ordered -Albuterol/Xopenex inhaler as needed -Please note that the patient did receive his [employer] vaccine in April 2021   2)GI prophylaxis--patient to history of PUD, Protonix for GI prophylaxis while on high-dose  steroids  3) history of glucose intolerance--- sliding scale insulin coverage while on high-dose steroids  4) history of HTN and PAFib--- did not appear to be any medications PTA, may use metoprolol as needed  Disposition/Need for in-Hospital Stay- patient unable to be discharged at this time due to --Covid pneumonia with hypoxia greater than 3 L of oxygen via nasal cannula,  IV steroids, supplemental oxygen and IV remdesivir*  Status is: Inpatient  Remains inpatient appropriate because:Covid pneumonia with hypoxia greater than 3 L of oxygen via nasal cannula,  IV steroids, supplemental oxygen and IV remdesivir*   Dispo: The patient is from: Home              Anticipated d/c is to: Home              Anticipated d/c date is: 3 days              Patient currently is not medically stable to d/c. Barriers: Not Clinically Stable- Covid pneumonia with hypoxia greater than 3 L of oxygen via nasal cannula,  IV steroids, supplemental oxygen and IV remdesivir*  With History of - Reviewed by me  Past Medical History:  Diagnosis Date  . 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 Hypertension   .  Perforated duodenal ulcer ([place]) 2015   contained, no surg, spontaneous resolution      Past Surgical History:  Procedure Laterality Date  . COLONOSCOPY  Sept 2015   [place]: transverse colon polyp with pathology reporting lymphoid nodule   . ESOPHAGOGASTRODUODENOSCOPY N/A 03/30/2014   Procedure: ESOPHAGOGASTRODUODENOSCOPY (EGD);  Surgeon: Daneil Dolin, MD;  Location: [facility];  Service: Endoscopy;  Laterality: N/A;  215  . None      Chief Complaint  Patient presents with  . Shortness of Breath      HPI:    Jose Lamb  is a 59 y.o. male non-smoker with past medical history relevant for HTN, PUD, glucose intolerance and h/o  PAFib who presents with a 1 week history of family members being sick with COVID-19 symptoms and patient previously tested positive for COVID-19 infection  -Over the  last 24 hours now having increasing shortness of breath, in the ED is found to be hypoxic requiring greater than 3 L of oxygen via nasal cannula - WBC 5.4, LFTs WNL, PCT less than 0.10, ferritin 318 -D-dimer greater than 20 -LDH 239, fibrinogen 689, CRP 11.9, chest x-ray consistent with Covid pneumonia  -Currently no vomiting no diarrhea no chest pains palpitations or pleuritic symptoms  -Please note that the patient did receive his [employer] vaccine in April 2021 Covid pneumonia with hypoxia greater than 3 L of oxygen via nasal cannula,  IV steroids, supplemental oxygen and IV remdesivir     Review of systems:    In addition to the HPI above,   A full Review of  Systems was done, all other systems reviewed are negative except as noted above in HPI , .    Social History:  Reviewed by me    Social History   Tobacco Use  . Smoking status: Never Smoker  . Smokeless tobacco: Never Used  Substance Use Topics  . Alcohol use: No    Alcohol/week: 0.0 standard drinks       Family History :  Reviewed by me    Family History  Problem Relation Age of Onset  . Ulcers Mother   . Heart Problems Mother   . Colon cancer Neg Hx      Home Medications:   Prior to Admission medications   Medication Sig Start Date End Date Taking? Authorizing Provider  meloxicam (MOBIC) 15 MG tablet Take 1 tablet (15 mg total) by mouth daily. Patient not taking: Reported on 09/15/2019 02/06/19   Hyatt, Max T, DPM  naproxen (NAPROSYN) 500 MG tablet Take 1 tablet (500 mg total) by mouth 2 (two) times daily with a meal. Patient not taking: Reported on 09/15/2019 08/05/19   Sanjuana Kava, MD  terbinafine (LAMISIL) 250 MG tablet Take 1 tablet (250 mg total) by mouth daily. Patient not taking: Reported on 09/15/2019 03/11/19   Garrel Ridgel, DPM     Allergies:     Allergies  Allergen Reactions  . Asa [Aspirin] Other (See Comments)    Nose bleeds     Physical Exam:   Vitals  Blood pressure  118/79, pulse (!) 104, temperature 99 F (37.2 C), temperature source Oral, resp. rate (!) 22, height 5\' 11"  (1.803 m), weight 127 kg, SpO2 93 %.  Physical Examination: General appearance - alert, ill appearing,  Mental status - alert, oriented to person, place, and time, Eyes - sclera anicteric Nose- [place] 3L/min Neck - supple, no JVD elevation , Chest -diminished in bases, scattered wheezes noted  Heart - S1 and S2 normal, regular  Abdomen - soft, nontender, nondistended, no masses or organomegaly Neurological - screening mental status exam normal, neck supple without rigidity, cranial nerves II through XII intact, DTR's normal and symmetric Extremities - no pedal edema noted, intact peripheral pulses  Skin - warm, dry     Data Review:    CBC Recent Labs  Lab 09/15/19 0705  WBC 5.4  HGB 12.4*  HCT 39.8  PLT 205  MCV 93.2  MCH 29.0  MCHC 31.2  RDW 13.1  LYMPHSABS 0.4*  MONOABS 0.4  EOSABS 0.1  BASOSABS 0.0   ------------------------------------------------------------------------------------------------------------------  Chemistries  Recent Labs  Lab 09/15/19 0705  NA 139  K 3.5  CL 102  CO2 28  GLUCOSE 137*  BUN 14  CREATININE 0.80  CALCIUM 8.3*  AST 26  ALT 43  ALKPHOS 54  BILITOT 0.6   ------------------------------------------------------------------------------------------------------------------ estimated creatinine clearance is 138.3 mL/min (by C-G formula based on SCr of 0.8 mg/dL). ------------------------------------------------------------------------------------------------------------------ No results for input(s): TSH, T4TOTAL, T3FREE, THYROIDAB in the last 72 hours.  Invalid input(s): FREET3   Coagulation profile No results for input(s): INR, PROTIME in the last 168 hours. ------------------------------------------------------------------------------------------------------------------- Recent Labs    09/15/19 0705  DDIMER >20.00*    -------------------------------------------------------------------------------------------------------------------  Cardiac Enzymes No results for input(s): CKMB, TROPONINI, MYOGLOBIN in the last 168 hours.  Invalid input(s): CK ------------------------------------------------------------------------------------------------------------------ No results found for: BNP   ---------------------------------------------------------------------------------------------------------------  Urinalysis    Component Value Date/Time   COLORURINE YELLOW 02/04/2014 [place] 02/04/2014 0752   LABSPEC >1.030 (H) 02/04/2014 0752   PHURINE 5.5 02/04/2014 0752   GLUCOSEU NEGATIVE 02/04/2014 0752   HGBUR NEGATIVE 02/04/2014 0752   BILIRUBINUR NEGATIVE 02/04/2014 0752   KETONESUR NEGATIVE 02/04/2014 0752   PROTEINUR NEGATIVE 02/04/2014 0752   UROBILINOGEN 0.2 02/04/2014 0752   NITRITE NEGATIVE 02/04/2014 0752   LEUKOCYTESUR NEGATIVE 02/04/2014 0752    ----------------------------------------------------------------------------------------------------------------   Imaging Results:    DG Chest Port 1 View  Result Date: 09/15/2019 CLINICAL DATA:  Cough, shortness of breath EXAM: PORTABLE CHEST 1 VIEW COMPARISON:  04/01/2018 FINDINGS: There are bilateral interstitial and patchy alveolar airspace opacities. There is no pleural effusion or pneumothorax. The heart and mediastinal contours are unremarkable. There is no acute osseous abnormality. IMPRESSION: Bilateral interstitial and patchy alveolar airspace opacities concerning for mild interstitial edema versus interstitial infection. Electronically Signed   By: Kathreen Devoid   On: 09/15/2019 07:26    Radiological Exams on Admission: DG Chest Port 1 View  Result Date: 09/15/2019 CLINICAL DATA:  Cough, shortness of breath EXAM: PORTABLE CHEST 1 VIEW COMPARISON:  04/01/2018 FINDINGS: There are bilateral interstitial and patchy alveolar  airspace opacities. There is no pleural effusion or pneumothorax. The heart and mediastinal contours are unremarkable. There is no acute osseous abnormality. IMPRESSION: Bilateral interstitial and patchy alveolar airspace opacities concerning for mild interstitial edema versus interstitial infection. Electronically Signed   By: Kathreen Devoid   On: 09/15/2019 07:26    DVT Prophylaxis -SCD /Heparin AM Labs Ordered, also please review Full Orders  Family Communication: Admission, patients condition and plan of care including tests being ordered have been discussed with the patient and who indicate understanding and agree with the plan   Code Status - Full Code  Likely DC to  Home after completing IV remdesivir infusions if hypoxia or respiratory distress improves  Condition   stable  Roxan Hockey M.D on 09/15/2019 at 6:08 PM Go to [email or web address] -  for  Psychologist, counselling  [facility] - Office  [phone]

## 2019-09-15 NOTE — ED Triage Notes (Signed)
Pt reports having vaccine in April, pt and his family were diagnosed with covid about one week ago. Increased shortness of breath and productive cough reported. Pt gets winded with exertion.

## 2019-09-15 NOTE — Progress Notes (Signed)
Ok to change Lovenox to 0.5mg /kg/day per Dr [name]  Onnie Boer, PharmD, BCIDP, AAHIVP, CPP Infectious Disease Pharmacist 09/15/2019 7:07 PM

## 2019-09-15 NOTE — ED Notes (Signed)
Pt. Blood sugar 169

## 2019-09-16 ENCOUNTER — Inpatient Hospital Stay (HOSPITAL_COMMUNITY): Payer: PRIVATE HEALTH INSURANCE

## 2019-09-16 DIAGNOSIS — J9621 Acute and chronic respiratory failure with hypoxia: Secondary | ICD-10-CM

## 2019-09-16 DIAGNOSIS — I10 Essential (primary) hypertension: Secondary | ICD-10-CM

## 2019-09-16 DIAGNOSIS — I2699 Other pulmonary embolism without acute cor pulmonale: Secondary | ICD-10-CM

## 2019-09-16 DIAGNOSIS — I480 Paroxysmal atrial fibrillation: Secondary | ICD-10-CM

## 2019-09-16 DIAGNOSIS — E1169 Type 2 diabetes mellitus with other specified complication: Secondary | ICD-10-CM

## 2019-09-16 DIAGNOSIS — J9601 Acute respiratory failure with hypoxia: Secondary | ICD-10-CM

## 2019-09-16 DIAGNOSIS — E1165 Type 2 diabetes mellitus with hyperglycemia: Secondary | ICD-10-CM

## 2019-09-16 LAB — CBC WITH DIFFERENTIAL/PLATELET
Abs Immature Granulocytes: 0.09 10*3/uL — ABNORMAL HIGH (ref 0.00–0.07)
Basophils Absolute: 0 10*3/uL (ref 0.0–0.1)
Basophils Relative: 0 %
Eosinophils Absolute: 0 10*3/uL (ref 0.0–0.5)
Eosinophils Relative: 0 %
HCT: 39.1 % (ref 39.0–52.0)
Hemoglobin: 12.1 g/dL — ABNORMAL LOW (ref 13.0–17.0)
Immature Granulocytes: 1 %
Lymphocytes Relative: 8 %
Lymphs Abs: 0.6 10*3/uL — ABNORMAL LOW (ref 0.7–4.0)
MCH: 29.1 pg (ref 26.0–34.0)
MCHC: 30.9 g/dL (ref 30.0–36.0)
MCV: 94 fL (ref 80.0–100.0)
Monocytes Absolute: 0.2 10*3/uL (ref 0.1–1.0)
Monocytes Relative: 2 %
Neutro Abs: 7 10*3/uL (ref 1.7–7.7)
Neutrophils Relative %: 89 %
Platelets: 226 10*3/uL (ref 150–400)
RBC: 4.16 MIL/uL — ABNORMAL LOW (ref 4.22–5.81)
RDW: 12.7 % (ref 11.5–15.5)
WBC: 7.9 10*3/uL (ref 4.0–10.5)
nRBC: 0 % (ref 0.0–0.2)

## 2019-09-16 LAB — C-REACTIVE PROTEIN: CRP: 16.3 mg/dL — ABNORMAL HIGH (ref <1.0)

## 2019-09-16 LAB — COMPREHENSIVE METABOLIC PANEL
ALT: 37 U/L (ref 0–44)
AST: 21 U/L (ref 15–41)
Albumin: 2.8 g/dL — ABNORMAL LOW (ref 3.5–5.0)
Alkaline Phosphatase: 56 U/L (ref 38–126)
Anion gap: 8 (ref 5–15)
BUN: 17 mg/dL (ref 6–20)
CO2: 28 mmol/L (ref 22–32)
Calcium: 8.4 mg/dL — ABNORMAL LOW (ref 8.9–10.3)
Chloride: 103 mmol/L (ref 98–111)
Creatinine, Ser: 0.78 mg/dL (ref 0.61–1.24)
GFR calc Af Amer: >60 mL/min (ref >60)
GFR calc non Af Amer: >60 mL/min (ref >60)
Glucose, Bld: 167 mg/dL — ABNORMAL HIGH (ref 70–99)
Potassium: 4.9 mmol/L (ref 3.5–5.1)
Sodium: 139 mmol/L (ref 135–145)
Total Bilirubin: 0.4 mg/dL (ref 0.3–1.2)
Total Protein: 8.1 g/dL (ref 6.5–8.1)

## 2019-09-16 LAB — ECHOCARDIOGRAM COMPLETE
Area-P 1/2: 3.91 cm2
Height: 71 in
S' Lateral: 3.35 cm
Weight: 4127.01 oz

## 2019-09-16 LAB — GLUCOSE, CAPILLARY
Glucose-Capillary: 146 mg/dL — ABNORMAL HIGH (ref 70–99)
Glucose-Capillary: 213 mg/dL — ABNORMAL HIGH (ref 70–99)
Glucose-Capillary: 155 mg/dL — ABNORMAL HIGH (ref 70–99)
Glucose-Capillary: 153 mg/dL — ABNORMAL HIGH (ref 70–99)

## 2019-09-16 LAB — HEPARIN LEVEL (UNFRACTIONATED): Heparin Unfractionated: 0.47 IU/mL (ref 0.30–0.70)

## 2019-09-16 LAB — MAGNESIUM: Magnesium: 2.4 mg/dL (ref 1.7–2.4)

## 2019-09-16 LAB — FERRITIN: Ferritin: 340 ng/mL — ABNORMAL HIGH (ref 24–336)

## 2019-09-16 LAB — D-DIMER, QUANTITATIVE: D-Dimer, Quant: >20 ug/mL-FEU — ABNORMAL HIGH (ref 0.00–0.50)

## 2019-09-16 LAB — PHOSPHORUS: Phosphorus: 2.9 mg/dL (ref 2.5–4.6)

## 2019-09-16 MED ORDER — HEPARIN BOLUS VIA INFUSION
5500.0000 [IU] | Freq: Once | INTRAVENOUS | Status: DC
  Filled 2019-09-16: qty 5500

## 2019-09-16 MED ORDER — HEPARIN BOLUS VIA INFUSION
5000.0000 [IU] | Freq: Once | INTRAVENOUS | Status: AC
  Administered 2019-09-16: 5000 [IU] via INTRAVENOUS
  Filled 2019-09-16: qty 5000

## 2019-09-16 MED ORDER — ALBUTEROL SULFATE HFA 108 (90 BASE) MCG/ACT IN AERS
2.0000 | INHALATION_SPRAY | Freq: Three times a day (TID) | RESPIRATORY_TRACT | Status: DC
  Administered 2019-09-17 – 2019-09-19 (×7): 2 via RESPIRATORY_TRACT

## 2019-09-16 MED ORDER — IOHEXOL 350 MG/ML SOLN
100.0000 mL | Freq: Once | INTRAVENOUS | Status: AC | PRN
  Administered 2019-09-16: 100 mL via INTRAVENOUS

## 2019-09-16 MED ORDER — ALBUTEROL SULFATE HFA 108 (90 BASE) MCG/ACT IN AERS: 2.0000 | INHALATION_SPRAY | RESPIRATORY_TRACT | Status: DC | PRN

## 2019-09-16 MED ORDER — HEPARIN (PORCINE) 25000 UT/250ML-% IV SOLN
1650.0000 [IU]/h | INTRAVENOUS | Status: AC
  Administered 2019-09-16 – 2019-09-18 (×4): 1650 [IU]/h via INTRAVENOUS
  Filled 2019-09-16 (×4): qty 250

## 2019-09-16 NOTE — Progress Notes (Signed)
*  PRELIMINARY RESULTS* Echocardiogram 2D Echocardiogram has been performed with Def[facility].  Samuel Germany 09/16/2019, 4:31 PM

## 2019-09-16 NOTE — Progress Notes (Signed)
Dr. Dyann Kief made aware of patient's CTA chest results.

## 2019-09-16 NOTE — Progress Notes (Addendum)
ANTICOAGULATION CONSULT NOTE - Initial Consult  Pharmacy Consult for heparin Indication: pulmonary embolus  Allergies  Allergen Reactions  . Asa [Aspirin] Other (See Comments)    Nose bleeds    Patient Measurements: Height: 5\' 11"  (180.3 cm) Weight: 117 kg (257 lb 15 oz) IBW/kg (Calculated) : 75.3 Heparin Dosing Weight: 101  Vital Signs: Temp: 98.3 F (36.8 C) (08/10 0415) Temp Source: Oral (08/10 0415) BP: 119/82 (08/10 0415) Pulse Rate: 88 (08/10 0415)  Labs: Recent Labs    09/15/19 0705 09/16/19 0445  HGB 12.4* 12.1*  HCT 39.8 39.1  PLT 205 226  CREATININE 0.80 0.78    Estimated Creatinine Clearance: 132.6 mL/min (by C-G formula based on SCr of 0.78 mg/dL).   Medical History: Past Medical History:  Diagnosis Date  . 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 Hypertension   . Perforated duodenal ulcer ([place]) 2015   contained, no surg, spontaneous resolution    Medications:  Medications Prior to Admission  Medication Sig Dispense Refill Last Dose  . meloxicam (MOBIC) 15 MG tablet Take 1 tablet (15 mg total) by mouth daily. (Patient not taking: Reported on 09/15/2019) 30 tablet 3 Completed Course at Unknown time  . naproxen (NAPROSYN) 500 MG tablet Take 1 tablet (500 mg total) by mouth 2 (two) times daily with a meal. (Patient not taking: Reported on 09/15/2019) 60 tablet 5 Completed Course at Unknown time  . terbinafine (LAMISIL) 250 MG tablet Take 1 tablet (250 mg total) by mouth daily. (Patient not taking: Reported on 09/15/2019) 30 tablet 0 Completed Course at Unknown time    Assessment: Pharmacy consulted to dose heparin in patient with PE.  Patient COVID positive.  Not on anticoagulation prior to admission but received one dose of lovenox prophylaxis  8/9 @ 2245.  Goal of Therapy:  Heparin level 0.3-0.7 units/ml Monitor platelets by anticoagulation protocol: Yes   Plan:  Give 5000 units bolus x 1 Start heparin infusion at 1650 units/hr Check anti-Xa level in 6  hours and daily while on heparin Continue to monitor H&H and platelets.   Margot Ables, PharmD Clinical Pharmacist 09/16/2019 11:43 AM

## 2019-09-16 NOTE — ED Provider Notes (Signed)
[facility] Provider Note   CSN: [id number] Arrival date & time: 09/15/19  0556     History Chief Complaint  Patient presents with  . Shortness of Breath    Jose Lamb is a 59 y.o. male.  Patient presents to the emergency department with complaints of shortness of breath, cough and congestion.  Patient did receive Covid vaccination in April.  He has had multiple family members test positive for Covid in the last week or so.  Patient reports feeling very short of breath and that he has minimal ability to walk secondary to dyspnea.        Past Medical History:  Diagnosis Date  . 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 Hypertension   . Perforated duodenal ulcer ([place]) 2015   contained, no surg, spontaneous resolution    Patient Active Problem List   Diagnosis Date Noted  . Acute respiratory disease due to COVID-19 virus 09/15/2019  . Pneumonia due to COVID-19 virus 09/15/2019  . Ectatic aorta ([place]) 04/17/2018  . Pre-diabetes 11/26/2014  . Asthma 11/05/2014  . Paroxysmal atrial fibrillation ([place]) 06/30/2014  . Graves' disease 04/01/2014  . Constipation 03/24/2014  . Hypertension 02/06/2014  . Moderate malnutrition ([place]) 02/06/2014  . Peptic ulcer with perforation ([place])   . Duodenal ulcer perforation ([place]) 02/04/2014  . HIV (human immunodeficiency virus infection) ([place]) 02/04/2014  . Perforated duodenal ulcer ([place]) 02/04/2014  . Neuropathic pain 03/13/2013  . Obesity 07/04/2012  . 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 COLONOSCOPY  Sept 2015   [place]: transverse colon polyp with pathology reporting lymphoid nodule   . ESOPHAGOGASTRODUODENOSCOPY N/A 03/30/2014   Procedure: ESOPHAGOGASTRODUODENOSCOPY (EGD);  Surgeon: Daneil Dolin, MD;  Location: [facility];  Service: Endoscopy;  Laterality: N/A;  215  . None         Family History  Problem Relation Age of Onset  . Ulcers Mother   . Heart  Problems Mother   . Colon cancer Neg Hx     Social History   Tobacco Use  . Smoking status: Never Smoker  . Smokeless tobacco: Never Used  Vaping Use  . Vaping Use: Never used  Substance Use Topics  . Alcohol use: No    Alcohol/week: 0.0 standard drinks  . Drug use: No    Home Medications Prior to Admission medications   Medication Sig Start Date End Date Taking? Authorizing Provider  meloxicam (MOBIC) 15 MG tablet Take 1 tablet (15 mg total) by mouth daily. Patient not taking: Reported on 09/15/2019 02/06/19   Hyatt, Max T, DPM  naproxen (NAPROSYN) 500 MG tablet Take 1 tablet (500 mg total) by mouth 2 (two) times daily with a meal. Patient not taking: Reported on 09/15/2019 08/05/19   Sanjuana Kava, MD  terbinafine (LAMISIL) 250 MG tablet Take 1 tablet (250 mg total) by mouth daily. Patient not taking: Reported on 09/15/2019 03/11/19   Garrel Ridgel, DPM    Allergies    Asa [aspirin]  Review of Systems   Review of Systems  Constitutional: Positive for fatigue.  Respiratory: Positive for cough and shortness of breath.   All other systems reviewed and are negative.   Physical Exam Updated Vital Signs BP 116/78 (BP Location: Left Arm)   Pulse 92   Temp 99.3 F (37.4 C) (Oral)   Resp (!) 21   Ht 5\' 11"  (1.803 m)   Wt 117 kg   SpO2 95%  BMI 35.98 kg/m   Physical Exam Vitals and nursing note reviewed.  Constitutional:      General: He is not in acute distress.    Appearance: Normal appearance. He is well-developed.  HENT:     Head: Normocephalic and atraumatic.     Right Ear: Hearing normal.     Left Ear: Hearing normal.     Nose: Nose normal.  Eyes:     Conjunctiva/sclera: Conjunctivae normal.     Pupils: Pupils are equal, round, and reactive to light.  Cardiovascular:     Rate and Rhythm: Regular rhythm.     Heart sounds: S1 normal and S2 normal. No murmur heard.  No friction rub. No gallop.   Pulmonary:     Effort: Pulmonary effort is normal. No respiratory  distress.     Breath sounds: Decreased breath sounds present.  Chest:     Chest wall: No tenderness.  Abdominal:     General: Bowel sounds are normal.     Palpations: Abdomen is soft.     Tenderness: There is no abdominal tenderness. There is no guarding or rebound. Negative signs include Murphy's sign and McBurney's sign.     Hernia: No hernia is present.  Musculoskeletal:        General: Normal range of motion.     Cervical back: Normal range of motion and neck supple.  Skin:    General: Skin is warm and dry.     Findings: No rash.  Neurological:     Mental Status: He is alert and oriented to person, place, and time.     GCS: GCS eye subscore is 4. GCS verbal subscore is 5. GCS motor subscore is 6.     Cranial Nerves: No cranial nerve deficit.     Sensory: No sensory deficit.     Coordination: Coordination normal.  Psychiatric:        Speech: Speech normal.        Behavior: Behavior normal.        Thought Content: Thought content normal.     ED Results / Procedures / Treatments   Labs (all labs ordered are listed, but only abnormal results are displayed) Labs Reviewed  SARS CORONAVIRUS 2 BY RT PCR ([facility]) - Abnormal; Notable for the following components:      Result Value   SARS Coronavirus 2 POSITIVE (*)    All other components within normal limits  CBC WITH DIFFERENTIAL/PLATELET - Abnormal; Notable for the following components:   Hemoglobin 12.4 (*)    Lymphs Abs 0.4 (*)    All other components within normal limits  COMPREHENSIVE METABOLIC PANEL - Abnormal; Notable for the following components:   Glucose, Bld 137 (*)    Calcium 8.3 (*)    Albumin 2.8 (*)    All other components within normal limits  D-DIMER, QUANTITATIVE (NOT AT [facility]) - Abnormal; Notable for the following components:   D-Dimer, Quant >20.00 (*)    All other components within normal limits  LACTATE DEHYDROGENASE - Abnormal; Notable for the following  components:   LDH 239 (*)    All other components within normal limits  FIBRINOGEN - Abnormal; Notable for the following components:   Fibrinogen 689 (*)    All other components within normal limits  C-REACTIVE PROTEIN - Abnormal; Notable for the following components:   CRP 11.9 (*)    All other components within normal limits  GLUCOSE, CAPILLARY - Abnormal; Notable for the  following components:   Glucose-Capillary 201 (*)    All other components within normal limits  CBG MONITORING, ED - Abnormal; Notable for the following components:   Glucose-Capillary 169 (*)    All other components within normal limits  CBG MONITORING, ED - Abnormal; Notable for the following components:   Glucose-Capillary 187 (*)    All other components within normal limits  CBG MONITORING, ED - Abnormal; Notable for the following components:   Glucose-Capillary 207 (*)    All other components within normal limits  CULTURE, BLOOD (ROUTINE X 2)  CULTURE, BLOOD (ROUTINE X 2)  LACTIC ACID, PLASMA  PROCALCITONIN  FERRITIN  TRIGLYCERIDES  CBC WITH DIFFERENTIAL/PLATELET  COMPREHENSIVE METABOLIC PANEL  C-REACTIVE PROTEIN  D-DIMER, QUANTITATIVE (NOT AT [facility])  MAGNESIUM  PHOSPHORUS  FERRITIN  ABO/RH    EKG None  Radiology DG Chest Port 1 View  Result Date: 09/15/2019 CLINICAL DATA:  Cough, shortness of breath EXAM: PORTABLE CHEST 1 VIEW COMPARISON:  04/01/2018 FINDINGS: There are bilateral interstitial and patchy alveolar airspace opacities. There is no pleural effusion or pneumothorax. The heart and mediastinal contours are unremarkable. There is no acute osseous abnormality. IMPRESSION: Bilateral interstitial and patchy alveolar airspace opacities concerning for mild interstitial edema versus interstitial infection. Electronically Signed   By: Kathreen Devoid   On: 09/15/2019 07:26    Procedures Procedures (including critical care time)  Medications Ordered in ED Medications  remdesivir 100 mg in sodium  chloride 0.9 % 100 mL IVPB (has no administration in time range)  morphine 4 MG/ML injection 4 mg (4 mg Intravenous Not Given 09/15/19 1004)  insulin aspart (novoLOG) injection 0-6 Units (2 Units Subcutaneous Given 09/15/19 1747)  insulin aspart (novoLOG) injection 0-5 Units (2 Units Subcutaneous Given 09/15/19 2244)  sodium chloride flush (NS) 0.9 % injection 3 mL (3 mLs Intravenous Given 09/15/19 2245)  sodium chloride flush (NS) 0.9 % injection 3 mL (has no administration in time range)  0.9 %  sodium chloride infusion (has no administration in time range)  traZODone (DESYREL) tablet 50 mg (has no administration in time range)  polyethylene glycol (MIRALAX / GLYCOLAX) packet 17 g (has no administration in time range)  ondansetron (ZOFRAN) tablet 4 mg (has no administration in time range)    Or  ondansetron (ZOFRAN) injection 4 mg (has no administration in time range)  albuterol (PROVENTIL) (2.5 MG/3ML) 0.083% nebulizer solution 2.5 mg (has no administration in time range)  multivitamin with minerals tablet 1 tablet (1 tablet Oral Given 09/15/19 0943)  acetaminophen (TYLENOL) tablet 650 mg (has no administration in time range)    Or  acetaminophen (TYLENOL) suppository 650 mg (has no administration in time range)  albuterol (VENTOLIN HFA) 108 (90 Base) MCG/ACT inhaler 2 puff (2 puffs Inhalation Given 09/16/19 0216)  methylPREDNISolone sodium succinate (SOLU-MEDROL) 125 mg/2 mL injection 63.75 mg (63.75 mg Intravenous Given 09/15/19 2240)  guaiFENesin-dextromethorphan (ROBITUSSIN DM) 100-10 MG/5ML syrup 10 mL (has no administration in time range)  chlorpheniramine-HYDROcodone (TUSSIONEX) 10-8 MG/5ML suspension 5 mL (5 mLs Oral Given 09/15/19 0943)  ascorbic acid (VITAMIN C) tablet 500 mg (500 mg Oral Given 09/15/19 0944)  zinc sulfate capsule 220 mg (220 mg Oral Given 09/15/19 0943)  pantoprazole (PROTONIX) EC tablet 40 mg (40 mg Oral Given 09/15/19 0944)  metoprolol tartrate (LOPRESSOR) tablet 25 mg (has no  administration in time range)  enoxaparin (LOVENOX) injection 60 mg (60 mg Subcutaneous Given 09/15/19 2243)  dexamethasone (DECADRON) injection 10 mg (10 mg Intravenous Given 09/15/19 0848)  remdesivir 100 mg in sodium chloride 0.9 % 100 mL IVPB (0 mg Intravenous Stopped 09/15/19 1324)  ondansetron (ZOFRAN) injection 4 mg (4 mg Intravenous Given 09/15/19 0848)  morphine 2 MG/ML injection (4 mg Intravenous Given 09/15/19 0943)    ED Course  I have reviewed the triage vital signs and the nursing notes.  Pertinent labs & imaging results that were available during my care of the patient were reviewed by me and considered in my medical decision making (see chart for details).    MDM Rules/Calculators/A&P                          Patient presents to the emergency department for evaluation of shortness of breath.  Patient has had multiple positive contacts with Covid.  He did test positive for Covid today.  X-ray with typical bibasilar infiltrate.  He has a new oxygen demand and will require hospitalization.  Work-up initiated at the end of my shift, signed out to Dr. Merrily Pew Long to follow-up on results.  Final Clinical Impression(s) / ED Diagnoses Final diagnoses:  Acute respiratory failure with hypoxia ([place])  COVID-19    Rx / DC Orders ED Discharge Orders    None       Erin Obando, Gwenyth Allegra, MD 09/16/19 [id number]

## 2019-09-16 NOTE — Progress Notes (Signed)
PROGRESS NOTE    Jose Lamb  ZCH:[id number] DOB: [birth year] DOA: 09/15/2019 PCP: [name], [name]    Chief Complaint  Patient presents with  . Shortness of Breath    Brief Narrative:  As per H&P written by Dr. [name] on 09/15/2019  Jose Lamb  is a 59 y.o. male non-smoker with past medical history relevant for HTN, PUD, glucose intolerance and h/o  PAFib who presents with a 1 week history of family members being sick with COVID-19 symptoms and patient previously tested positive for COVID-19 infection  -Over the last 24 hours now having increasing shortness of breath, in the ED is found to be hypoxic requiring greater than 3 L of oxygen via nasal cannula - WBC 5.4, LFTs WNL, PCT less than 0.10, ferritin 318 -D-dimer greater than 20 -LDH 239, fibrinogen 689, CRP 11.9, chest x-ray consistent with Covid pneumonia  -Currently no vomiting no diarrhea no chest pains palpitations or pleuritic symptoms  -Please note that the patient did receive his [employer] vaccine in April 2021 Covid pneumonia with hypoxia greater than 3 L of oxygen via nasal cannula,  IV steroids, supplemental oxygen and IV remdesivir  Assessment & Plan: 1-acute respiratory failure with hypoxia due to COVID-19 pneumonia and bilateral pulmonary embolism -Continue IV steroids and IV remdesivir -Continue to wean oxygen supplementation as tolerated -Continue to follow rheumatoid markers on daily basis -Given elevated D-dimer CT angiogram was done demonstrating bilateral pulmonary embolism with mild right heart strain; patient has been started on heparin drip and will follow 2D echo results. -Continue as needed bronchodilators, antitussive, flutter valve and incentive spirometer. -Continue increasing activity as tolerated and following proning position most times of the day. -Continue vitamin C and zinc.  2-type 2 diabetes mellitus -With ongoing hyperglycemia secondary to steroids usage -Follow  A1c -Continue sliding scale insulin and Levemir -Holding oral hypoglycemic agents currently.  3-essential hypertension -Overall stable and well-controlled -Continue current antihypertensive regimen.  4-prior history of paroxysmal atrial fibrillation -Currently sinus rhythm appreciated on telemetry -Continue monitoring electrolytes and assure adequate levels. -Patient will require anticoagulation for acute blood clots at least for the next 6 months. -Follow echo results; if changes of CHF appreciated might require longer 10 anticoagulation as he is CHADSVASC score will be > 2. -Patient was not using any rate control agent prior to admission; as needed beta-blocker has been ordered. -Continue to monitor on telemetry.  5-class II morbid obesity -Body mass index is 35.98 kg/m. -Low calorie diet, portion control and increase physical activity discussed with patient.  6-gastroesophageal reflux disease/GI prophylaxis -Continue PPI.  DVT prophylaxis: Heparin drip Code Status: Full code. Family Communication: No family at bedside.  Patient expressed that he will update family members over the phone by himself. Disposition:   Status is: Inpatient  Dispo: The patient is from: Home              Anticipated d/c is to: Home              Anticipated d/c date is: To be determined              Patient currently no medically stable for discharge; patient is still short of breath with minimal activity, requiring increased amount of oxygen supplementation and with new diagnosis of bilateral pulmonary embolism.  Will continue IV remdesivir, IV steroids and IV heparin.  Follow 2D echo results and continue supportive care.    Consultants:   None  Procedures:  See below for x-ray reports;  CT angiogram of the chest positive for bilateral pulmonary embolism with mild right heart strain.  2D echo: Pending   Antimicrobials:  Remdesivir   Subjective: Patient reports intermittent pleuritic  chest pain; no nausea, no vomiting, no fever.  Short winded with exertion and requiring 8 L nasal cannula supplementation overnight.  Significant elevation of his D-dimer level.  Objective: Vitals:   09/16/19 0019 09/16/19 0217 09/16/19 0415 09/16/19 0811  BP: 116/78  119/82   Pulse: 92  88   Resp: (!) 21  20   Temp: 99.3 F (37.4 C)  98.3 F (36.8 C)   TempSrc: Oral  Oral   SpO2: 95% 95% 96% 96%  Weight:      Height:       No intake or output data in the 24 hours ending 09/16/19 1446 Filed Weights   09/15/19 0617 09/15/19 2014  Weight: 127 kg 117 kg    Examination:  General exam: Afebrile, report feeling short winded with minimal activity has ended requiring 8 L nasal cannula supplementation overnight.  Intermittent pleuritic chest pain reported.  No palpitations, no nausea, no vomiting. Respiratory system: Positive rhonchi bilaterally; no using accessory muscles. Cardiovascular system: S1 & S2 heard, RRR. No JVD, murmurs, rubs, gallops or clicks. No pedal edema. Gastrointestinal system: Abdomen is obese, nondistended, soft and nontender. No organomegaly or masses felt. Normal bowel sounds heard. Central nervous system: Alert and oriented. No focal neurological deficits. Extremities: No cyanosis or clubbing; trace edema appreciated bilaterally. Skin: No rashes, no petechiae. Psychiatry: Judgement and insight appear normal. Mood & affect appropriate.     Data Reviewed: I have personally reviewed following labs and imaging studies  CBC: Recent Labs  Lab 09/15/19 0705 09/16/19 0445  WBC 5.4 7.9  NEUTROABS 4.4 7.0  HGB 12.4* 12.1*  HCT 39.8 39.1  MCV 93.2 94.0  PLT 205 267    Basic Metabolic Panel: Recent Labs  Lab 09/15/19 0705 09/16/19 0445  NA 139 139  K 3.5 4.9  CL 102 103  CO2 28 28  GLUCOSE 137* 167*  BUN 14 17  CREATININE 0.80 0.78  CALCIUM 8.3* 8.4*  MG  --  2.4  PHOS  --  2.9    GFR: Estimated Creatinine Clearance: 132.6 mL/min (by C-G formula  based on SCr of 0.78 mg/dL).  Liver Function Tests: Recent Labs  Lab 09/15/19 0705 09/16/19 0445  AST 26 21  ALT 43 37  ALKPHOS 54 56  BILITOT 0.6 0.4  PROT 8.0 8.1  ALBUMIN 2.8* 2.8*    CBG: Recent Labs  Lab 09/15/19 1352 09/15/19 1659 09/15/19 2019 09/16/19 0752 09/16/19 1152  GLUCAP 187* 207* 201* 146* 213*     Recent Results (from the past 240 hour(s))  SARS Coronavirus 2 by RT PCR (hospital order, performed in [facility] hospital lab) Nasopharyngeal Nasopharyngeal Swab     Status: Abnormal   Collection Time: 09/15/19  6:41 AM   Specimen: Nasopharyngeal Swab  Result Value Ref Range Status   SARS Coronavirus 2 POSITIVE (A) NEGATIVE Final    Comment: CRITICAL RESULT CALLED TO, READ BACK BY AND VERIFIED WITH: REBBECCA MENTOR,RN @1005  08/09/2021kay (NOTE) SARS-CoV-2 target nucleic acids are DETECTED  SARS-CoV-2 RNA is generally detectable in upper respiratory specimens  during the acute phase of infection.  Positive results are indicative  of the presence of the identified virus, but do not rule out bacterial infection or co-infection with other pathogens not detected by the test.  Clinical correlation with patient history and  other diagnostic information is necessary to determine patient infection status.  The expected result is negative.  Fact Sheet for Patients:   StrictlyIdeas.no   Fact Sheet for Healthcare Providers:   BankingDealers.co.za    This test is not yet approved or cleared by the [place] FDA and  has been authorized for detection and/or diagnosis of SARS-CoV-2 by FDA under an Emergency Use Authorization (EUA).  This EUA will remain in effect (me aning this test can be used) for the duration of  the COVID-19 declaration under Section 564(b)(1) of the Act, 21 U.S.C. section 360-bbb-3(b)(1), unless the authorization is terminated or revoked sooner.  Performed at [facility], [address], [place], [place] [address]   Blood Culture (routine x 2)     Status: None (Preliminary result)   Collection Time: 09/15/19  7:04 AM   Specimen: BLOOD  Result Value Ref Range Status   Specimen Description BLOOD RIGHT ANTECUBITAL  Final   Special Requests   Final    BOTTLES DRAWN AEROBIC AND ANAEROBIC Blood Culture adequate volume   Culture   Final    NO GROWTH 1 DAY Performed at [facility], [address] [place], [place] [address]    Report Status PENDING  Incomplete  Blood Culture (routine x 2)     Status: None (Preliminary result)   Collection Time: 09/15/19  7:13 AM   Specimen: BLOOD  Result Value Ref Range Status   Specimen Description BLOOD LEFT HAND  Final   Special Requests   Final    BOTTLES DRAWN AEROBIC AND ANAEROBIC Blood Culture adequate volume   Culture   Final    NO GROWTH 1 DAY Performed at [facility], [address] [place], [place] [address]    Report Status PENDING  Incomplete      Radiology Studies: CT ANGIO CHEST PE W OR WO CONTRAST  Result Date: 09/16/2019 CLINICAL DATA:  Shortness of breath and cough. Positive COVID test. EXAM: CT ANGIOGRAPHY CHEST WITH CONTRAST TECHNIQUE: Multidetector CT imaging of the chest was performed using the standard protocol during bolus administration of intravenous contrast. Multiplanar CT image reconstructions and MIPs were obtained to evaluate the vascular anatomy. CONTRAST:  12mL OMNIPAQUE IOHEXOL 350 MG/ML SOLN COMPARISON:  Chest x-ray 09/15/2019 prior chest CT 03/26/2018 FINDINGS: Cardiovascular: The heart is normal in size. No pericardial effusion. The aorta is normal in caliber. No dissection. No atherosclerotic calcifications. The branch vessels are patent. Proximal left coronary artery calcifications are noted. Enlargement of the pulmonary arterial trunk is a stable finding and likely reflects pulmonary hypertension. The pulmonary arterial tree is fairly well opacified. There is significant bilateral pulmonary emboli most  notable in the right lower lobe. There is slight flattening of the left ventricle but the RV LV ratio is less than 1. Mediastinum/Nodes: Scattered mildly enlarged mediastinal and hilar lymph nodes likely reactive/inflammatory. Lungs/Pleura: Patchy peripheral ground-glass infiltrates consistent with COVID pneumonia. There are also areas of subpleural atelectasis likely related to the pulmonary emboli. No pleural effusions. No worrisome pulmonary lesions. Upper Abdomen: No significant upper abdominal findings. Musculoskeletal: No significant bony findings. Review of the MIP images confirms the above findings. IMPRESSION: 1. Significant bilateral pulmonary emboli. Findings for mild right heart strain with slight flattening of the left ventricle but the RV LV ratio is less than 1. 2. Normal thoracic aorta. 3. Stable enlarged pulmonary arterial trunk suggesting pulmonary hypertension. 4. Patchy peripheral ground-glass infiltrates consistent with COVID pneumonia. 5. Enlarged mediastinal and hilar lymph nodes, likely reactive/inflammatory.  6. Proximal left coronary artery calcifications. These results will be called to the ordering clinician or representative by the Radiologist Assistant, and communication documented in the PACS or [employer]. Electronically Signed   By: Marijo Sanes M.D.   On: 09/16/2019 10:33   DG Chest Port 1 View  Result Date: 09/15/2019 CLINICAL DATA:  Cough, shortness of breath EXAM: PORTABLE CHEST 1 VIEW COMPARISON:  04/01/2018 FINDINGS: There are bilateral interstitial and patchy alveolar airspace opacities. There is no pleural effusion or pneumothorax. The heart and mediastinal contours are unremarkable. There is no acute osseous abnormality. IMPRESSION: Bilateral interstitial and patchy alveolar airspace opacities concerning for mild interstitial edema versus interstitial infection. Electronically Signed   By: Kathreen Devoid   On: 09/15/2019 07:26    Scheduled Meds: . albuterol  2  puff Inhalation Q6H  . vitamin C  500 mg Oral Daily  . insulin aspart  0-5 Units Subcutaneous QHS  . insulin aspart  0-6 Units Subcutaneous TID WC  . methylPREDNISolone (SOLU-MEDROL) injection  0.5 mg/kg Intravenous Q12H  . morphine  4 mg Intravenous Once  . multivitamin with minerals  1 tablet Oral Daily  . pantoprazole  40 mg Oral Daily  . sodium chloride flush  3 mL Intravenous Q12H  . zinc sulfate  220 mg Oral Daily   Continuous Infusions: . sodium chloride    . heparin 1,650 Units/hr (09/16/19 1410)  . remdesivir 100 mg in NS 100 mL 100 mg (09/16/19 1100)     LOS: 1 day    Time spent: 35 minutes.  Barton Dubois, MD [facility]   To contact the attending provider between 7A-7P or the covering provider during after hours 7P-7A, please log into the web site [email or web address] and access using universal [place] password for that web site. If you do not have the password, please call the hospital operator.  09/16/2019, 2:46 PM

## 2019-09-16 NOTE — Progress Notes (Signed)
ANTICOAGULATION CONSULT NOTE - Follow Up Consult  Pharmacy Consult for heparin Indication: pulmonary embolus  Labs: Recent Labs    09/15/19 0705 09/16/19 0445 09/16/19 1904  HGB 12.4* 12.1*  --   HCT 39.8 39.1  --   PLT 205 226  --   HEPARINUNFRC  --   --  0.47  CREATININE 0.80 0.78  --     Assessment/Plan:  59yo male therapeutic on heparin with initial dosing for PE. Will continue gtt at current rate and confirm stable with am labs.   Wynona Neat, PharmD, BCPS  09/16/2019,11:26 PM

## 2019-09-17 DIAGNOSIS — J1282 Pneumonia due to coronavirus disease 2019: Secondary | ICD-10-CM

## 2019-09-17 DIAGNOSIS — J9601 Acute respiratory failure with hypoxia: Secondary | ICD-10-CM

## 2019-09-17 LAB — COMPREHENSIVE METABOLIC PANEL
ALT: 31 U/L (ref 0–44)
AST: 20 U/L (ref 15–41)
Albumin: 2.7 g/dL — ABNORMAL LOW (ref 3.5–5.0)
Alkaline Phosphatase: 49 U/L (ref 38–126)
Anion gap: 8 (ref 5–15)
BUN: 21 mg/dL — ABNORMAL HIGH (ref 6–20)
CO2: 29 mmol/L (ref 22–32)
Calcium: 8.1 mg/dL — ABNORMAL LOW (ref 8.9–10.3)
Chloride: 103 mmol/L (ref 98–111)
Creatinine, Ser: 0.72 mg/dL (ref 0.61–1.24)
GFR calc Af Amer: >60 mL/min (ref >60)
GFR calc non Af Amer: >60 mL/min (ref >60)
Glucose, Bld: 174 mg/dL — ABNORMAL HIGH (ref 70–99)
Potassium: 4.2 mmol/L (ref 3.5–5.1)
Sodium: 140 mmol/L (ref 135–145)
Total Bilirubin: 0.4 mg/dL (ref 0.3–1.2)
Total Protein: 7.4 g/dL (ref 6.5–8.1)

## 2019-09-17 LAB — CBC WITH DIFFERENTIAL/PLATELET
Abs Immature Granulocytes: 0.09 10*3/uL — ABNORMAL HIGH (ref 0.00–0.07)
Basophils Absolute: 0 10*3/uL (ref 0.0–0.1)
Basophils Relative: 0 %
Eosinophils Absolute: 0 10*3/uL (ref 0.0–0.5)
Eosinophils Relative: 0 %
HCT: 38.7 % — ABNORMAL LOW (ref 39.0–52.0)
Hemoglobin: 11.8 g/dL — ABNORMAL LOW (ref 13.0–17.0)
Immature Granulocytes: 1 %
Lymphocytes Relative: 9 %
Lymphs Abs: 0.7 10*3/uL (ref 0.7–4.0)
MCH: 28.9 pg (ref 26.0–34.0)
MCHC: 30.5 g/dL (ref 30.0–36.0)
MCV: 94.6 fL (ref 80.0–100.0)
Monocytes Absolute: 0.4 10*3/uL (ref 0.1–1.0)
Monocytes Relative: 5 %
Neutro Abs: 6.9 10*3/uL (ref 1.7–7.7)
Neutrophils Relative %: 85 %
Platelets: 204 10*3/uL (ref 150–400)
RBC: 4.09 MIL/uL — ABNORMAL LOW (ref 4.22–5.81)
RDW: 12.9 % (ref 11.5–15.5)
WBC: 8.1 10*3/uL (ref 4.0–10.5)
nRBC: 0 % (ref 0.0–0.2)

## 2019-09-17 LAB — D-DIMER, QUANTITATIVE: D-Dimer, Quant: 10.36 ug/mL-FEU — ABNORMAL HIGH (ref 0.00–0.50)

## 2019-09-17 LAB — FERRITIN: Ferritin: 379 ng/mL — ABNORMAL HIGH (ref 24–336)

## 2019-09-17 LAB — C-REACTIVE PROTEIN: CRP: 6.7 mg/dL — ABNORMAL HIGH (ref <1.0)

## 2019-09-17 LAB — HEPARIN LEVEL (UNFRACTIONATED): Heparin Unfractionated: 0.33 IU/mL (ref 0.30–0.70)

## 2019-09-17 LAB — GLUCOSE, CAPILLARY
Glucose-Capillary: 159 mg/dL — ABNORMAL HIGH (ref 70–99)
Glucose-Capillary: 131 mg/dL — ABNORMAL HIGH (ref 70–99)
Glucose-Capillary: 189 mg/dL — ABNORMAL HIGH (ref 70–99)
Glucose-Capillary: 222 mg/dL — ABNORMAL HIGH (ref 70–99)

## 2019-09-17 LAB — MAGNESIUM: Magnesium: 2.5 mg/dL — ABNORMAL HIGH (ref 1.7–2.4)

## 2019-09-17 LAB — PHOSPHORUS: Phosphorus: 2.9 mg/dL (ref 2.5–4.6)

## 2019-09-17 NOTE — Progress Notes (Signed)
PROGRESS NOTE  Jose Lamb HMC:[id number] DOB: Nov 29, 1960 DOA: 09/15/2019 PCP: [name], [name]  Brief History:  As per H&P written by Dr. [name] on 09/15/2019 [name] a59 y.o.malenon-smoker with past medical history relevant for HTN, PUD, glucose intolerance and h/oPAFib whopresents with a 1 week history of family members being sick with COVID-19 symptoms and patient previously tested positive for COVID-19 infection  -Over the last 24 hours now having increasing shortness of breath, in the ED is found to be hypoxic requiring greater than 3 L of oxygen via nasal cannula - WBC 5.4,LFTs WNL, PCT less than 0.10,ferritin 318 -D-dimer greater than 20 -LDH 239,fibrinogen 689,CRP 11.9,chest x-ray consistent with Covid pneumonia  -Currently no vomiting no diarrhea no chest pains palpitations or pleuritic symptoms  -Please note that the patient did receive his [employer] vaccine in April 2021 Covid pneumonia with hypoxia greater than 3 L of oxygen via nasal cannula,IV steroids, supplemental oxygen and IV remdesivir  Assessment/Plan: acute respiratory failure with hypoxia  -due to COVID-19 pneumonia and bilateral pulmonary embolism -now on 3L [place] -Continue IV steroids and IV remdesivir -Continue to wean oxygen supplementation as tolerated -Continue to follow inflammatory markders -Given elevated D-dimer CTA chest was done demonstrating bilateral pulmonary embolism with mild right heart strain;  -continue IV heparin -Continue as needed bronchodilators, antitussive, flutter valve and incentive spirometer. -Continue increasing activity as tolerated and following proning position most times of the day. -Continue vitamin C and zinc.  Acute PE -09/16/19 CTA chest--bilateral PE and patchy GGO -09/16/19 Echo--EF 60-65%, no WMA; normal RV systolic function -continue IV heparin  Impaired glucose tolerance -With ongoing hyperglycemia secondary to  steroids usage -Follow A1c -Continue sliding scale insulin and Levemir -Holding oral hypoglycemic agents currently.  paroxysmal atrial fibrillation -Currently sinus rhythm appreciated on telemetry -optimize electrolytes -Patient will require anticoagulation for acute PE at least for the next 6 months. -09/16/19 Echo as above -Patient was not using any rate control agent prior to admission; as needed beta-blocker has been ordered. -Continue to monitor on telemetry.  class II morbid obesity -Body mass index is 35.98 kg/m. -Low calorie diet, portion control and increase physical activity discussed with patient.  gastroesophageal reflux disease/GI prophylaxis -Continue PPI.      Status is: Inpatient  Remains inpatient appropriate because:IV treatments appropriate due to intensity of illness or inability to take PO.  Remains on IV heparin   Dispo: The patient is from: Home              Anticipated d/c is to: Home              Anticipated d/c date is: 2 days              Patient currently is not medically stable to d/c.        Family Communication:   No Family at bedside  Consultants:  none  Code Status:  FULL /  DVT Prophylaxis:  IV Heparin    Procedures: As Listed in Progress Note Above  Antibiotics: None       Subjective: Patient is breathing better.  Denies f/c, cp, n/v/d, abd pain.  Objective: Vitals:   09/16/19 1500 09/16/19 2152 09/16/19 2152 09/17/19 0558  BP: 125/79  107/74 110/72  Pulse: 88  87 72  Resp: 20  20 20   Temp: 98.5 F (36.9 C)  98.6 F (37 C) 97.7 F (36.5 C)  TempSrc: Oral  Oral Oral  SpO2: 93% 98% 97% 95%  Weight:      Height:        Intake/Output Summary (Last 24 hours) at 09/17/2019 1702 Last data filed at 09/17/2019 1300 Gross per 24 hour  Intake 480 ml  Output --  Net 480 ml   Weight change:  Exam:   General:  Pt is alert, follows commands appropriately, not in acute distress  HEENT: No icterus, No  thrush, No neck mass, [place]/AT  Cardiovascular: RRR, S1/S2, no rubs, no gallops  Respiratory: bibasilar crackles. No wheeze  Abdomen: Soft/+BS, non tender, non distended, no guarding  Extremities: 1+LE edema, No lymphangitis, No petechiae, No rashes, no synovitis   Data Reviewed: I have personally reviewed following labs and imaging studies Basic Metabolic Panel: Recent Labs  Lab 09/15/19 0705 09/16/19 0445 09/17/19 0602  NA 139 139 140  K 3.5 4.9 4.2  CL 102 103 103  CO2 28 28 29   GLUCOSE 137* 167* 174*  BUN 14 17 21*  CREATININE 0.80 0.78 0.72  CALCIUM 8.3* 8.4* 8.1*  MG  --  2.4 2.5*  PHOS  --  2.9 2.9   Liver Function Tests: Recent Labs  Lab 09/15/19 0705 09/16/19 0445 09/17/19 0602  AST 26 21 20   ALT 43 37 31  ALKPHOS 54 56 49  BILITOT 0.6 0.4 0.4  PROT 8.0 8.1 7.4  ALBUMIN 2.8* 2.8* 2.7*   No results for input(s): LIPASE, AMYLASE in the last 168 hours. No results for input(s): AMMONIA in the last 168 hours. Coagulation Profile: No results for input(s): INR, PROTIME in the last 168 hours. CBC: Recent Labs  Lab 09/15/19 0705 09/16/19 0445 09/17/19 0602  WBC 5.4 7.9 8.1  NEUTROABS 4.4 7.0 6.9  HGB 12.4* 12.1* 11.8*  HCT 39.8 39.1 38.7*  MCV 93.2 94.0 94.6  PLT 205 226 204   Cardiac Enzymes: No results for input(s): CKTOTAL, CKMB, CKMBINDEX, TROPONINI in the last 168 hours. BNP: Invalid input(s): POCBNP CBG: Recent Labs  Lab 09/16/19 1726 09/16/19 2231 09/17/19 0759 09/17/19 1128 09/17/19 1642  GLUCAP 155* 153* 159* 131* 189*   HbA1C: No results for input(s): HGBA1C in the last 72 hours. Urine analysis:    Component Value Date/Time   COLORURINE YELLOW 02/04/2014 0752   APPEARANCEUR CLEAR 02/04/2014 0752   LABSPEC >1.030 (H) 02/04/2014 0752   PHURINE 5.5 02/04/2014 0752   GLUCOSEU NEGATIVE 02/04/2014 0752   HGBUR NEGATIVE 02/04/2014 0752   BILIRUBINUR NEGATIVE 02/04/2014 0752   KETONESUR NEGATIVE 02/04/2014 0752   PROTEINUR NEGATIVE  02/04/2014 0752   UROBILINOGEN 0.2 02/04/2014 0752   NITRITE NEGATIVE 02/04/2014 0752   LEUKOCYTESUR NEGATIVE 02/04/2014 0752   Sepsis Labs: @LABRCNTIP (procalcitonin:4,lacticidven:4) ) Recent Results (from the past 240 hour(s))  SARS Coronavirus 2 by RT PCR (hospital order, performed in [place] hospital lab) Nasopharyngeal Nasopharyngeal Swab     Status: Abnormal   Collection Time: 09/15/19  6:41 AM   Specimen: Nasopharyngeal Swab  Result Value Ref Range Status   SARS Coronavirus 2 POSITIVE (A) NEGATIVE Final    Comment: CRITICAL RESULT CALLED TO, READ BACK BY AND VERIFIED WITH: REBBECCA MENTOR,RN @1005  08/09/2021kay (NOTE) SARS-CoV-2 target nucleic acids are DETECTED  SARS-CoV-2 RNA is generally detectable in upper respiratory specimens  during the acute phase of infection.  Positive results are indicative  of the presence of the identified virus, but do not rule out bacterial infection or co-infection with other pathogens not detected by the test.  Clinical correlation with patient history and  other diagnostic information is necessary to determine patient infection status.  The expected result is negative.  Fact Sheet for Patients:   StrictlyIdeas.no   Fact Sheet for Healthcare Providers:   BankingDealers.co.za    This test is not yet approved or cleared by the [place] FDA and  has been authorized for detection and/or diagnosis of SARS-CoV-2 by FDA under an Emergency Use Authorization (EUA).  This EUA will remain in effect (me aning this test can be used) for the duration of  the COVID-19 declaration under Section 564(b)(1) of the Act, 21 U.S.C. section 360-bbb-3(b)(1), unless the authorization is terminated or revoked sooner.  Performed at [facility], [address] [place], [place] [address]   Blood Culture (routine x 2)     Status: None (Preliminary result)   Collection Time: 09/15/19  7:04 AM   Specimen:  BLOOD  Result Value Ref Range Status   Specimen Description BLOOD RIGHT ANTECUBITAL  Final   Special Requests   Final    BOTTLES DRAWN AEROBIC AND ANAEROBIC Blood Culture adequate volume   Culture   Final    NO GROWTH 2 DAYS Performed at [facility], [address], [place], [place] [address]    Report Status PENDING  Incomplete  Blood Culture (routine x 2)     Status: None (Preliminary result)   Collection Time: 09/15/19  7:13 AM   Specimen: BLOOD  Result Value Ref Range Status   Specimen Description BLOOD LEFT HAND  Final   Special Requests   Final    BOTTLES DRAWN AEROBIC AND ANAEROBIC Blood Culture adequate volume   Culture   Final    NO GROWTH 2 DAYS Performed at [facility], [address], [place], [place] [address]    Report Status PENDING  Incomplete     Scheduled Meds:  albuterol  2 puff Inhalation TID   vitamin C  500 mg Oral Daily   insulin aspart  0-5 Units Subcutaneous QHS   insulin aspart  0-6 Units Subcutaneous TID WC   methylPREDNISolone (SOLU-MEDROL) injection  0.5 mg/kg Intravenous Q12H   morphine  4 mg Intravenous Once   multivitamin with minerals  1 tablet Oral Daily   pantoprazole  40 mg Oral Daily   sodium chloride flush  3 mL Intravenous Q12H   zinc sulfate  220 mg Oral Daily   Continuous Infusions:  sodium chloride     heparin 1,650 Units/hr (09/17/19 0250)   remdesivir 100 mg in NS 100 mL 100 mg (09/17/19 1050)    Procedures/Studies: CT ANGIO CHEST PE W OR WO CONTRAST  Result Date: 09/16/2019 CLINICAL DATA:  Shortness of breath and cough. Positive COVID test. EXAM: CT ANGIOGRAPHY CHEST WITH CONTRAST TECHNIQUE: Multidetector CT imaging of the chest was performed using the standard protocol during bolus administration of intravenous contrast. Multiplanar CT image reconstructions and MIPs were obtained to evaluate the vascular anatomy. CONTRAST:  116mL OMNIPAQUE IOHEXOL 350 MG/ML SOLN COMPARISON:  Chest x-ray 09/15/2019 prior chest CT  03/26/2018 FINDINGS: Cardiovascular: The heart is normal in size. No pericardial effusion. The aorta is normal in caliber. No dissection. No atherosclerotic calcifications. The branch vessels are patent. Proximal left coronary artery calcifications are noted. Enlargement of the pulmonary arterial trunk is a stable finding and likely reflects pulmonary hypertension. The pulmonary arterial tree is fairly well opacified. There is significant bilateral pulmonary emboli most notable in the right lower lobe. There is slight flattening of the left ventricle but the RV LV ratio is less  than 1. Mediastinum/Nodes: Scattered mildly enlarged mediastinal and hilar lymph nodes likely reactive/inflammatory. Lungs/Pleura: Patchy peripheral ground-glass infiltrates consistent with COVID pneumonia. There are also areas of subpleural atelectasis likely related to the pulmonary emboli. No pleural effusions. No worrisome pulmonary lesions. Upper Abdomen: No significant upper abdominal findings. Musculoskeletal: No significant bony findings. Review of the MIP images confirms the above findings. IMPRESSION: 1. Significant bilateral pulmonary emboli. Findings for mild right heart strain with slight flattening of the left ventricle but the RV LV ratio is less than 1. 2. Normal thoracic aorta. 3. Stable enlarged pulmonary arterial trunk suggesting pulmonary hypertension. 4. Patchy peripheral ground-glass infiltrates consistent with COVID pneumonia. 5. Enlarged mediastinal and hilar lymph nodes, likely reactive/inflammatory. 6. Proximal left coronary artery calcifications. These results will be called to the ordering clinician or representative by the Radiologist Assistant, and communication documented in the PACS or [employer]. Electronically Signed   By: Marijo Sanes M.D.   On: 09/16/2019 10:33   DG Chest Port 1 View  Result Date: 09/15/2019 CLINICAL DATA:  Cough, shortness of breath EXAM: PORTABLE CHEST 1 VIEW COMPARISON:   04/01/2018 FINDINGS: There are bilateral interstitial and patchy alveolar airspace opacities. There is no pleural effusion or pneumothorax. The heart and mediastinal contours are unremarkable. There is no acute osseous abnormality. IMPRESSION: Bilateral interstitial and patchy alveolar airspace opacities concerning for mild interstitial edema versus interstitial infection. Electronically Signed   By: Kathreen Devoid   On: 09/15/2019 07:26   ECHOCARDIOGRAM COMPLETE  Result Date: 09/16/2019    ECHOCARDIOGRAM REPORT   Patient Name:   Jose Lamb Date of Exam: 09/16/2019 Medical Rec #:  [id number]      Height:       71.0 in Accession #:    [id number]     Weight:       257.9 lb Date of Birth:  Jul 25, 1960      BSA:          2.349 m Patient Age:    14 years       BP:           119/82 mmHg Patient Gender: M              HR:           88 bpm. Exam Location:  [facility] Procedure: 2D Echo, Cardiac Doppler and Color Doppler Indications:    Pulmonary Embolus 415.19 / I26.99  History:        Patient has prior history of Echocardiogram examinations, most                 recent 03/24/2014. Risk Factors:Hypertension. Acute respiratory                 disease due to COVID-19 virus,HIV (human immunodeficiency virus                 infection),Pre-diabetes, Obesity.  Sonographer:    Alvino Chapel RCS Referring Phys: Roberta  1. Left ventricular ejection fraction, by estimation, is 60 to 65%. The left ventricle has normal function. The left ventricle has no regional wall motion abnormalities. Left ventricular diastolic parameters were normal.  2. Right ventricular systolic function is normal. The right ventricular size is normal.  3. The mitral valve is normal in structure. No evidence of mitral valve regurgitation. No evidence of mitral stenosis.  4. The aortic valve is normal in structure. Aortic valve regurgitation is not visualized. No aortic stenosis is present.  5. The inferior  vena cava is normal in size  with greater than 50% respiratory variability, suggesting right atrial pressure of 3 mmHg. FINDINGS  Left Ventricle: Left ventricular ejection fraction, by estimation, is 60 to 65%. The left ventricle has normal function. The left ventricle has no regional wall motion abnormalities. Definity contrast agent was given IV to delineate the left ventricular  endocardial borders. The left ventricular internal cavity size was normal in size. There is no left ventricular hypertrophy. Left ventricular diastolic parameters were normal. Right Ventricle: The right ventricular size is normal. No increase in right ventricular wall thickness. Right ventricular systolic function is normal. Left Atrium: Left atrial size was normal in size. Right Atrium: Right atrial size was normal in size. Pericardium: There is no evidence of pericardial effusion. Mitral Valve: The mitral valve is normal in structure. Normal mobility of the mitral valve leaflets. No evidence of mitral valve regurgitation. No evidence of mitral valve stenosis. Tricuspid Valve: The tricuspid valve is normal in structure. Tricuspid valve regurgitation is not demonstrated. No evidence of tricuspid stenosis. Aortic Valve: The aortic valve is normal in structure. Aortic valve regurgitation is not visualized. No aortic stenosis is present. Pulmonic Valve: The pulmonic valve was normal in structure. Pulmonic valve regurgitation is not visualized. No evidence of pulmonic stenosis. Aorta: The aortic root is normal in size and structure. Venous: The inferior vena cava is normal in size with greater than 50% respiratory variability, suggesting right atrial pressure of 3 mmHg. IAS/Shunts: No atrial level shunt detected by color flow Doppler.  LEFT VENTRICLE PLAX 2D LVIDd:         4.72 cm  Diastology LVIDs:         3.35 cm  LV e' lateral:   16.50 cm/s LV PW:         1.11 cm  LV E/e' lateral: 5.1 LV IVS:        1.10 cm  LV e' medial:    9.03 cm/s LVOT diam:     2.00 cm  LV E/e'  medial:  9.3 LV SV:         72 LV SV Index:   31 LVOT Area:     3.14 cm  RIGHT VENTRICLE RV S prime:     19.60 cm/s TAPSE (M-mode): 3.3 cm LEFT ATRIUM              Index       RIGHT ATRIUM           Index LA diam:        4.00 cm  1.70 cm/m  RA Area:     18.90 cm LA Vol (A2C):   109.0 ml 46.40 ml/m RA Volume:   46.80 ml  19.92 ml/m LA Vol (A4C):   63.9 ml  27.20 ml/m LA Biplane Vol: 90.1 ml  38.35 ml/m  AORTIC VALVE LVOT Vmax:   142.00 cm/s LVOT Vmean:  82.100 cm/s LVOT VTI:    0.230 m  AORTA Ao Root diam: 3.90 cm MITRAL VALVE MV Area (PHT): 3.91 cm    SHUNTS MV Decel Time: 194 msec    Systemic VTI:  0.23 m MV E velocity: 84.00 cm/s  Systemic Diam: 2.00 cm MV A velocity: 70.60 cm/s MV E/A ratio:  1.19 Jenkins Rouge MD Electronically signed by Jenkins Rouge MD Signature Date/Time: 09/16/2019/4:37:58 PM    Final     Orson Eva, DO  [facility]  If 7PM-7AM, please contact night-coverage [email or web address] Password [facility] 09/17/2019, 5:02 PM   LOS:  2 days

## 2019-09-18 LAB — CBC WITH DIFFERENTIAL/PLATELET
Abs Immature Granulocytes: 0.31 10*3/uL — ABNORMAL HIGH (ref 0.00–0.07)
Basophils Absolute: 0 10*3/uL (ref 0.0–0.1)
Basophils Relative: 1 %
Eosinophils Absolute: 0 10*3/uL (ref 0.0–0.5)
Eosinophils Relative: 0 %
HCT: 37.4 % — ABNORMAL LOW (ref 39.0–52.0)
Hemoglobin: 11.6 g/dL — ABNORMAL LOW (ref 13.0–17.0)
Immature Granulocytes: 4 %
Lymphocytes Relative: 9 %
Lymphs Abs: 0.7 10*3/uL (ref 0.7–4.0)
MCH: 29.1 pg (ref 26.0–34.0)
MCHC: 31 g/dL (ref 30.0–36.0)
MCV: 94 fL (ref 80.0–100.0)
Monocytes Absolute: 0.4 10*3/uL (ref 0.1–1.0)
Monocytes Relative: 5 %
Neutro Abs: 6.6 10*3/uL (ref 1.7–7.7)
Neutrophils Relative %: 81 %
Platelets: 225 10*3/uL (ref 150–400)
RBC: 3.98 MIL/uL — ABNORMAL LOW (ref 4.22–5.81)
RDW: 12.9 % (ref 11.5–15.5)
WBC: 8 10*3/uL (ref 4.0–10.5)
nRBC: 0.3 % — ABNORMAL HIGH (ref 0.0–0.2)

## 2019-09-18 LAB — PHOSPHORUS: Phosphorus: 2.1 mg/dL — ABNORMAL LOW (ref 2.5–4.6)

## 2019-09-18 LAB — GLUCOSE, CAPILLARY
Glucose-Capillary: 169 mg/dL — ABNORMAL HIGH (ref 70–99)
Glucose-Capillary: 170 mg/dL — ABNORMAL HIGH (ref 70–99)
Glucose-Capillary: 212 mg/dL — ABNORMAL HIGH (ref 70–99)
Glucose-Capillary: 175 mg/dL — ABNORMAL HIGH (ref 70–99)

## 2019-09-18 LAB — HEMOGLOBIN A1C
Hgb A1c MFr Bld: 6.8 % — ABNORMAL HIGH (ref 4.8–5.6)
Mean Plasma Glucose: 148.46 mg/dL

## 2019-09-18 LAB — COMPREHENSIVE METABOLIC PANEL
ALT: 37 U/L (ref 0–44)
AST: 23 U/L (ref 15–41)
Albumin: 2.5 g/dL — ABNORMAL LOW (ref 3.5–5.0)
Alkaline Phosphatase: 55 U/L (ref 38–126)
Anion gap: 9 (ref 5–15)
BUN: 22 mg/dL — ABNORMAL HIGH (ref 6–20)
CO2: 27 mmol/L (ref 22–32)
Calcium: 8.3 mg/dL — ABNORMAL LOW (ref 8.9–10.3)
Chloride: 104 mmol/L (ref 98–111)
Creatinine, Ser: 0.79 mg/dL (ref 0.61–1.24)
GFR calc Af Amer: >60 mL/min (ref >60)
GFR calc non Af Amer: >60 mL/min (ref >60)
Glucose, Bld: 214 mg/dL — ABNORMAL HIGH (ref 70–99)
Potassium: 4.3 mmol/L (ref 3.5–5.1)
Sodium: 140 mmol/L (ref 135–145)
Total Bilirubin: 0.4 mg/dL (ref 0.3–1.2)
Total Protein: 7.2 g/dL (ref 6.5–8.1)

## 2019-09-18 LAB — HEPARIN LEVEL (UNFRACTIONATED): Heparin Unfractionated: 0.5 IU/mL (ref 0.30–0.70)

## 2019-09-18 LAB — MAGNESIUM: Magnesium: 2.4 mg/dL (ref 1.7–2.4)

## 2019-09-18 LAB — C-REACTIVE PROTEIN: CRP: 3.2 mg/dL — ABNORMAL HIGH (ref <1.0)

## 2019-09-18 LAB — D-DIMER, QUANTITATIVE: D-Dimer, Quant: 3.42 ug/mL-FEU — ABNORMAL HIGH (ref 0.00–0.50)

## 2019-09-18 LAB — FERRITIN: Ferritin: 385 ng/mL — ABNORMAL HIGH (ref 24–336)

## 2019-09-18 MED ORDER — APIXABAN 5 MG PO TABS: 5.0000 mg | ORAL_TABLET | Freq: Two times a day (BID) | ORAL | Status: DC

## 2019-09-18 MED ORDER — APIXABAN 5 MG PO TABS
10.0000 mg | ORAL_TABLET | Freq: Two times a day (BID) | ORAL | Status: DC
  Administered 2019-09-18 – 2019-09-19 (×2): 10 mg via ORAL
  Filled 2019-09-18 (×2): qty 2

## 2019-09-18 NOTE — Progress Notes (Addendum)
Appointment cancelled, pt staying in hospital per Dr. Carles Collet   Patient scheduled for outpatient Remdesivir infusion at 2pm on Friday 8/13 at [facility]. Please inform the patient to park at [place], as staff will be escorting the patient through the [place] entrance of the hospital.    There is a wave flag banner located near the entrance on [address]. Turn into this entrance and immediately turn left and park in 1 of the 5 designated Covid Infusion Parking spots. There is a phone number on the sign, please call and let the staff know what spot you are in and we will come out and get you. For questions call [phone].  Thanks.  * If patient is getting dropped off, you may have your ride pull up to the main entrance of [facility]. Please wait in the car and call [phone] to inform staff you have arrived. Staff will come out and meet the patient at the car and escort them through the hospital to the clinic.   Transportation set up for patient. MB

## 2019-09-18 NOTE — Progress Notes (Signed)
ANTICOAGULATION CONSULT NOTE -   Pharmacy Consult for heparin Indication: pulmonary embolus  Allergies  Allergen Reactions  . Asa [Aspirin] Other (See Comments)    Nose bleeds    Patient Measurements: Height: 5\' 11"  (180.3 cm) Weight: 117 kg (257 lb 15 oz) IBW/kg (Calculated) : 75.3 Heparin Dosing Weight: 101  Vital Signs: Temp: 98.1 F (36.7 C) (08/12 0640) Temp Source: Oral (08/12 0640) BP: 132/79 (08/12 0640) Pulse Rate: 77 (08/12 0640)  Labs: Recent Labs    09/16/19 0445 09/16/19 0445 09/16/19 1904 09/17/19 0602 09/18/19 0417  HGB 12.1*   < >  --  11.8* 11.6*  HCT 39.1  --   --  38.7* 37.4*  PLT 226  --   --  204 225  HEPARINUNFRC  --   --  0.47 0.33 0.50  CREATININE 0.78  --   --  0.72 0.79   < > = values in this interval not displayed.    Estimated Creatinine Clearance: 132.6 mL/min (by C-G formula based on SCr of 0.79 mg/dL).   Medical History: Past Medical History:  Diagnosis Date  . 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 Hypertension   . Perforated duodenal ulcer ([place]) 2015   contained, no surg, spontaneous resolution    Medications:  Medications Prior to Admission  Medication Sig Dispense Refill Last Dose  . meloxicam (MOBIC) 15 MG tablet Take 1 tablet (15 mg total) by mouth daily. (Patient not taking: Reported on 09/15/2019) 30 tablet 3 Completed Course at Unknown time  . naproxen (NAPROSYN) 500 MG tablet Take 1 tablet (500 mg total) by mouth 2 (two) times daily with a meal. (Patient not taking: Reported on 09/15/2019) 60 tablet 5 Completed Course at Unknown time  . terbinafine (LAMISIL) 250 MG tablet Take 1 tablet (250 mg total) by mouth daily. (Patient not taking: Reported on 09/15/2019) 30 tablet 0 Completed Course at Unknown time    Assessment: Pharmacy consulted to dose heparin in patient with PE.  Patient COVID positive.  Not on anticoagulation prior to admission but received one dose of lovenox prophylaxis  8/9 @ 2245.  HL 0.50- therapeutic  Goal  of Therapy:  Heparin level 0.3-0.7 units/ml Monitor platelets by anticoagulation protocol: Yes   Plan:  Continue heparin infusion at 1650 units/hr Check anti-Xa level daily while on heparin Continue to monitor H&H and platelets.   Margot Ables, PharmD Clinical Pharmacist 09/18/2019 8:41 AM

## 2019-09-18 NOTE — Progress Notes (Signed)
ANTICOAGULATION CONSULT NOTE - Follow Up Consult  Pharmacy Consult for Heparin > Eliquis Indication: pulmonary embolus  Allergies  Allergen Reactions  . Asa [Aspirin] Other (See Comments)    Nose bleeds    Patient Measurements: Height: 5\' 11"  (180.3 cm) Weight: 117 kg (257 lb 15 oz) IBW/kg (Calculated) : 75.3 Heparin Dosing Weight: 101 kg  Vital Signs: Temp: 97.7 F (36.5 C) (08/12 1450) BP: 120/70 (08/12 1450) Pulse Rate: 82 (08/12 2026)  Labs: Recent Labs    09/16/19 0445 09/16/19 0445 09/16/19 1904 09/17/19 0602 09/18/19 0417  HGB 12.1*   < >  --  11.8* 11.6*  HCT 39.1  --   --  38.7* 37.4*  PLT 226  --   --  204 225  HEPARINUNFRC  --   --  0.47 0.33 0.50  CREATININE 0.78  --   --  0.72 0.79   < > = values in this interval not displayed.    Estimated Creatinine Clearance: 132.6 mL/min (by C-G formula based on SCr of 0.79 mg/dL).  Assessment:  59 yr old male with PE. COVID positive.  Not on anticoagulation prior to admission but received one dose of lovenox prophylaxis  8/9 @ 2245. Pharmacy consulted for IV heparin dosing on 8/10.  Now to transition to Eliquis.  Last heparin level was therapeutic (0.50) this am. CBC stable.  Goal of Therapy:  Heparin level 0.3-0.7 units/ml appropriate Eliquis regimen for indication Monitor platelets by anticoagulation protocol: Yes   Plan:   Eliquis 10 mg BID x 7 days, then 5 mg BID.  IV heparin to stop when giving first dose of Eliquis.  Arty Baumgartner,  Phone: [phone] 09/18/2019,8:29 PM

## 2019-09-18 NOTE — Progress Notes (Signed)
PROGRESS NOTE  Jose Lamb WHQ:[id number] DOB: [birth year] DOA: 09/15/2019 PCP: [name], [name]   Brief History:  As per H&P written by Dr. [name] on 09/15/2019 [name] a17 y.o.malenon-smoker with past medical history relevant for HTN, PUD, glucose intolerance and h/oPAFib whopresents with a 1 week history of family members being sick with COVID-19 symptoms and patient previously tested positive for COVID-19 infection  -Over the last 24 hours now having increasing shortness of breath, in the ED is found to be hypoxic requiring greater than 3 L of oxygen via nasal cannula - WBC 5.4,LFTs WNL, PCT less than 0.10,ferritin 318 -D-dimer greater than 20 -LDH 239,fibrinogen 689,CRP 11.9,chest x-ray consistent with Covid pneumonia  -Currently no vomiting no diarrhea no chest pains palpitations or pleuritic symptoms  -Please note that the patient did receive his [employer] vaccine in April 2021 Covid pneumonia with hypoxia greater than 3 L of oxygen via nasal cannula,IV steroids, supplemental oxygen and IV remdesivir  Assessment/Plan: acute respiratory failure with hypoxia  -due to COVID-19 pneumonia and bilateral pulmonary embolism -now on 3L [place]>>>RA -Continue IV steroids and IV remdesivir -Continue to wean oxygen supplementation as tolerated -Continue to follow inflammatory markders -Given elevated D-dimer CTA chest was done demonstrating bilateral pulmonary embolism with mild right heart strain;  -continue IV heparin>>apixaban -Continue as needed bronchodilators, antitussive, flutter valve and incentive spirometer. -Continue increasing activity as tolerated and following proning position most times of the day. -Continue vitamin C and zinc. -ambulatory pulse ox did not show any desaturation <88%  Acute PE -09/16/19 CTA chest--bilateral PE and patchy GGO -09/16/19 Echo--EF 60-65%, no WMA; normal RV systolic function -continue IV  heparin>>>apixaban  Diabetes Mellitus Type 2 -With ongoing hyperglycemia secondary to steroids usage -Follow A1c--6.8 -Continue sliding scale insulin and Levemir  paroxysmal atrial fibrillation -Currently sinus rhythm appreciated on telemetry -optimize electrolytes -Patient will require anticoagulation for acute PE at least for the next 6 months. -09/16/19 Echo as above -Patient was not using any rate control agent prior to admission; as needed beta-blocker has been ordered. -Continue to monitor on telemetry.  class II morbid obesity -Body mass index is 35.98 kg/m. -Low calorie diet, portion control and increase physical activity discussed with patient.  gastroesophageal reflux disease/GI prophylaxis -Continue PPI.      Status is: Inpatient  Remains inpatient appropriate because:IV treatments appropriate due to intensity of illness or inability to take PO.  Remains on IV heparin   Dispo: The patient is from: Home  Anticipated d/c is to: Home  Anticipated d/c date is: 2 days  Patient currently is not medically stable to d/c.        Family Communication:   No Family at bedside  Consultants:  none  Code Status:  FULL   DVT Prophylaxis:  IV Heparin    Procedures: As Listed in Progress Note Above  Antibiotics: None    Subjective: Patient denies fevers, chills, headache, chest pain, dyspnea, nausea, vomiting, diarrhea, abdominal pain, dysuria, hematuria, hematochezia, and melena.   Objective: Vitals:   09/18/19 0640 09/18/19 0924 09/18/19 1425 09/18/19 1450  BP: 132/79   120/70  Pulse: 77   86  Resp: 20     Temp: 98.1 F (36.7 C)   97.7 F (36.5 C)  TempSrc: Oral     SpO2: 99% 96% 92% 94%  Weight:      Height:       No intake or output data in the 24  hours ending 09/18/19 1845 Weight change:  Exam:   General:  Pt is alert, follows commands appropriately, not in acute  distress  HEENT: No icterus, No thrush, No neck mass, /AT  Cardiovascular: RRR, S1/S2, no rubs, no gallops  Respiratory: bibasilar crackles. No wheeze  Abdomen: Soft/+BS, non tender, non distended, no guarding  Extremities: No edema, No lymphangitis, No petechiae, No rashes, no synovitis   Data Reviewed: I have personally reviewed following labs and imaging studies Basic Metabolic Panel: Recent Labs  Lab 09/15/19 0705 09/16/19 0445 09/17/19 0602 09/18/19 0417  NA 139 139 140 140  K 3.5 4.9 4.2 4.3  CL 102 103 103 104  CO2 28 28 29 27   GLUCOSE 137* 167* 174* 214*  BUN 14 17 21* 22*  CREATININE 0.80 0.78 0.72 0.79  CALCIUM 8.3* 8.4* 8.1* 8.3*  MG  --  2.4 2.5* 2.4  PHOS  --  2.9 2.9 2.1*   Liver Function Tests: Recent Labs  Lab 09/15/19 0705 09/16/19 0445 09/17/19 0602 09/18/19 0417  AST 26 21 20 23   ALT 43 37 31 37  ALKPHOS 54 56 49 55  BILITOT 0.6 0.4 0.4 0.4  PROT 8.0 8.1 7.4 7.2  ALBUMIN 2.8* 2.8* 2.7* 2.5*   No results for input(s): LIPASE, AMYLASE in the last 168 hours. No results for input(s): AMMONIA in the last 168 hours. Coagulation Profile: No results for input(s): INR, PROTIME in the last 168 hours. CBC: Recent Labs  Lab 09/15/19 0705 09/16/19 0445 09/17/19 0602 09/18/19 0417  WBC 5.4 7.9 8.1 8.0  NEUTROABS 4.4 7.0 6.9 6.6  HGB 12.4* 12.1* 11.8* 11.6*  HCT 39.8 39.1 38.7* 37.4*  MCV 93.2 94.0 94.6 94.0  PLT 205 226 204 225   Cardiac Enzymes: No results for input(s): CKTOTAL, CKMB, CKMBINDEX, TROPONINI in the last 168 hours. BNP: Invalid input(s): POCBNP CBG: Recent Labs  Lab 09/17/19 1642 09/17/19 2127 09/18/19 0848 09/18/19 1125 09/18/19 1605  GLUCAP 189* 222* 169* 170* 212*   HbA1C: Recent Labs    09/17/19 0602  HGBA1C 6.8*   Urine analysis:    Component Value Date/Time   COLORURINE YELLOW 02/04/2014 0752   APPEARANCEUR CLEAR 02/04/2014 0752   LABSPEC >1.030 (H) 02/04/2014 0752   PHURINE 5.5 02/04/2014 0752    GLUCOSEU NEGATIVE 02/04/2014 0752   HGBUR NEGATIVE 02/04/2014 0752   BILIRUBINUR NEGATIVE 02/04/2014 0752   KETONESUR NEGATIVE 02/04/2014 0752   PROTEINUR NEGATIVE 02/04/2014 0752   UROBILINOGEN 0.2 02/04/2014 0752   NITRITE NEGATIVE 02/04/2014 0752   LEUKOCYTESUR NEGATIVE 02/04/2014 0752   Sepsis Labs: @LABRCNTIP (procalcitonin:4,lacticidven:4) ) Recent Results (from the past 240 hour(s))  SARS Coronavirus 2 by RT PCR (hospital order, performed in [facility] hospital lab) Nasopharyngeal Nasopharyngeal Swab     Status: Abnormal   Collection Time: 09/15/19  6:41 AM   Specimen: Nasopharyngeal Swab  Result Value Ref Range Status   SARS Coronavirus 2 POSITIVE (A) NEGATIVE Final    Comment: CRITICAL RESULT CALLED TO, READ BACK BY AND VERIFIED WITH: REBBECCA MENTOR,RN @1005  08/09/2021kay (NOTE) SARS-CoV-2 target nucleic acids are DETECTED  SARS-CoV-2 RNA is generally detectable in upper respiratory specimens  during the acute phase of infection.  Positive results are indicative  of the presence of the identified virus, but do not rule out bacterial infection or co-infection with other pathogens not detected by the test.  Clinical correlation with patient history and  other diagnostic information is necessary to determine patient infection status.  The expected result is negative.  Fact Sheet for Patients:   StrictlyIdeas.no   Fact Sheet for Healthcare Providers:   BankingDealers.co.za    This test is not yet approved or cleared by the [place] FDA and  has been authorized for detection and/or diagnosis of SARS-CoV-2 by FDA under an Emergency Use Authorization (EUA).  This EUA will remain in effect (me aning this test can be used) for the duration of  the COVID-19 declaration under Section 564(b)(1) of the Act, 21 U.S.C. section 360-bbb-3(b)(1), unless the authorization is terminated or revoked sooner.  Performed at [facility], [address] [place], [place] [address]   Blood Culture (routine x 2)     Status: None (Preliminary result)   Collection Time: 09/15/19  7:04 AM   Specimen: BLOOD  Result Value Ref Range Status   Specimen Description BLOOD RIGHT ANTECUBITAL  Final   Special Requests   Final    BOTTLES DRAWN AEROBIC AND ANAEROBIC Blood Culture adequate volume   Culture   Final    NO GROWTH 2 DAYS Performed at [facility], [address], [place], [place] [address]    Report Status PENDING  Incomplete  Blood Culture (routine x 2)     Status: None (Preliminary result)   Collection Time: 09/15/19  7:13 AM   Specimen: BLOOD  Result Value Ref Range Status   Specimen Description BLOOD LEFT HAND  Final   Special Requests   Final    BOTTLES DRAWN AEROBIC AND ANAEROBIC Blood Culture adequate volume   Culture   Final    NO GROWTH 2 DAYS Performed at [facility], [address], [place], [place] [address]    Report Status PENDING  Incomplete     Scheduled Meds: . albuterol  2 puff Inhalation TID  . vitamin C  500 mg Oral Daily  . insulin aspart  0-5 Units Subcutaneous QHS  . insulin aspart  0-6 Units Subcutaneous TID WC  . methylPREDNISolone (SOLU-MEDROL) injection  0.5 mg/kg Intravenous Q12H  . morphine  4 mg Intravenous Once  . multivitamin with minerals  1 tablet Oral Daily  . pantoprazole  40 mg Oral Daily  . sodium chloride flush  3 mL Intravenous Q12H  . zinc sulfate  220 mg Oral Daily   Continuous Infusions: . sodium chloride    . heparin 1,650 Units/hr (09/18/19 1213)  . remdesivir 100 mg in NS 100 mL 100 mg (09/18/19 1248)    Procedures/Studies: CT ANGIO CHEST PE W OR WO CONTRAST  Result Date: 09/16/2019 CLINICAL DATA:  Shortness of breath and cough. Positive COVID test. EXAM: CT ANGIOGRAPHY CHEST WITH CONTRAST TECHNIQUE: Multidetector CT imaging of the chest was performed using the standard protocol during bolus administration of intravenous contrast. Multiplanar CT image  reconstructions and MIPs were obtained to evaluate the vascular anatomy. CONTRAST:  170mL OMNIPAQUE IOHEXOL 350 MG/ML SOLN COMPARISON:  Chest x-ray 09/15/2019 prior chest CT 03/26/2018 FINDINGS: Cardiovascular: The heart is normal in size. No pericardial effusion. The aorta is normal in caliber. No dissection. No atherosclerotic calcifications. The branch vessels are patent. Proximal left coronary artery calcifications are noted. Enlargement of the pulmonary arterial trunk is a stable finding and likely reflects pulmonary hypertension. The pulmonary arterial tree is fairly well opacified. There is significant bilateral pulmonary emboli most notable in the right lower lobe. There is slight flattening of the left ventricle but the RV LV ratio is less than 1. Mediastinum/Nodes: Scattered mildly enlarged mediastinal and hilar lymph nodes likely reactive/inflammatory. Lungs/Pleura: Patchy peripheral ground-glass  infiltrates consistent with COVID pneumonia. There are also areas of subpleural atelectasis likely related to the pulmonary emboli. No pleural effusions. No worrisome pulmonary lesions. Upper Abdomen: No significant upper abdominal findings. Musculoskeletal: No significant bony findings. Review of the MIP images confirms the above findings. IMPRESSION: 1. Significant bilateral pulmonary emboli. Findings for mild right heart strain with slight flattening of the left ventricle but the RV LV ratio is less than 1. 2. Normal thoracic aorta. 3. Stable enlarged pulmonary arterial trunk suggesting pulmonary hypertension. 4. Patchy peripheral ground-glass infiltrates consistent with COVID pneumonia. 5. Enlarged mediastinal and hilar lymph nodes, likely reactive/inflammatory. 6. Proximal left coronary artery calcifications. These results will be called to the ordering clinician or representative by the Radiologist Assistant, and communication documented in the PACS or [employer]. Electronically Signed   By: Marijo Sanes M.D.   On: 09/16/2019 10:33   DG Chest Port 1 View  Result Date: 09/15/2019 CLINICAL DATA:  Cough, shortness of breath EXAM: PORTABLE CHEST 1 VIEW COMPARISON:  04/01/2018 FINDINGS: There are bilateral interstitial and patchy alveolar airspace opacities. There is no pleural effusion or pneumothorax. The heart and mediastinal contours are unremarkable. There is no acute osseous abnormality. IMPRESSION: Bilateral interstitial and patchy alveolar airspace opacities concerning for mild interstitial edema versus interstitial infection. Electronically Signed   By: Kathreen Devoid   On: 09/15/2019 07:26   ECHOCARDIOGRAM COMPLETE  Result Date: 09/16/2019    ECHOCARDIOGRAM REPORT   Patient Name:   DARNELL JESCHKE Date of Exam: 09/16/2019 Medical Rec #:  [id number]      Height:       71.0 in Accession #:    [id number]     Weight:       257.9 lb Date of Birth:  27-Mar-1960      BSA:          2.349 m Patient Age:    32 years       BP:           119/82 mmHg Patient Gender: M              HR:           88 bpm. Exam Location:  [facility] Procedure: 2D Echo, Cardiac Doppler and Color Doppler Indications:    Pulmonary Embolus 415.19 / I26.99  History:        Patient has prior history of Echocardiogram examinations, most                 recent 03/24/2014. Risk Factors:Hypertension. Acute respiratory                 disease due to COVID-19 virus,HIV (human immunodeficiency virus                 infection),Pre-diabetes, Obesity.  Sonographer:    Alvino Chapel RCS Referring Phys: [place]  1. Left ventricular ejection fraction, by estimation, is 60 to 65%. The left ventricle has normal function. The left ventricle has no regional wall motion abnormalities. Left ventricular diastolic parameters were normal.  2. Right ventricular systolic function is normal. The right ventricular size is normal.  3. The mitral valve is normal in structure. No evidence of mitral valve regurgitation. No evidence of mitral  stenosis.  4. The aortic valve is normal in structure. Aortic valve regurgitation is not visualized. No aortic stenosis is present.  5. The inferior vena cava is normal in size with greater than 50% respiratory variability, suggesting right atrial pressure  of 3 mmHg. FINDINGS  Left Ventricle: Left ventricular ejection fraction, by estimation, is 60 to 65%. The left ventricle has normal function. The left ventricle has no regional wall motion abnormalities. Definity contrast agent was given IV to delineate the left ventricular  endocardial borders. The left ventricular internal cavity size was normal in size. There is no left ventricular hypertrophy. Left ventricular diastolic parameters were normal. Right Ventricle: The right ventricular size is normal. No increase in right ventricular wall thickness. Right ventricular systolic function is normal. Left Atrium: Left atrial size was normal in size. Right Atrium: Right atrial size was normal in size. Pericardium: There is no evidence of pericardial effusion. Mitral Valve: The mitral valve is normal in structure. Normal mobility of the mitral valve leaflets. No evidence of mitral valve regurgitation. No evidence of mitral valve stenosis. Tricuspid Valve: The tricuspid valve is normal in structure. Tricuspid valve regurgitation is not demonstrated. No evidence of tricuspid stenosis. Aortic Valve: The aortic valve is normal in structure. Aortic valve regurgitation is not visualized. No aortic stenosis is present. Pulmonic Valve: The pulmonic valve was normal in structure. Pulmonic valve regurgitation is not visualized. No evidence of pulmonic stenosis. Aorta: The aortic root is normal in size and structure. Venous: The inferior vena cava is normal in size with greater than 50% respiratory variability, suggesting right atrial pressure of 3 mmHg. IAS/Shunts: No atrial level shunt detected by color flow Doppler.  LEFT VENTRICLE PLAX 2D LVIDd:         4.72 cm  Diastology  LVIDs:         3.35 cm  LV e' lateral:   16.50 cm/s LV PW:         1.11 cm  LV E/e' lateral: 5.1 LV IVS:        1.10 cm  LV e' medial:    9.03 cm/s LVOT diam:     2.00 cm  LV E/e' medial:  9.3 LV SV:         72 LV SV Index:   31 LVOT Area:     3.14 cm  RIGHT VENTRICLE RV S prime:     19.60 cm/s TAPSE (M-mode): 3.3 cm LEFT ATRIUM              Index       RIGHT ATRIUM           Index LA diam:        4.00 cm  1.70 cm/m  RA Area:     18.90 cm LA Vol (A2C):   109.0 ml 46.40 ml/m RA Volume:   46.80 ml  19.92 ml/m LA Vol (A4C):   63.9 ml  27.20 ml/m LA Biplane Vol: 90.1 ml  38.35 ml/m  AORTIC VALVE LVOT Vmax:   142.00 cm/s LVOT Vmean:  82.100 cm/s LVOT VTI:    0.230 m  AORTA Ao Root diam: 3.90 cm MITRAL VALVE MV Area (PHT): 3.91 cm    SHUNTS MV Decel Time: 194 msec    Systemic VTI:  0.23 m MV E velocity: 84.00 cm/s  Systemic Diam: 2.00 cm MV A velocity: 70.60 cm/s MV E/A ratio:  1.19 Jenkins Rouge MD Electronically signed by Jenkins Rouge MD Signature Date/Time: 09/16/2019/4:37:58 PM    Final     Orson Eva, DO  [facility]  If 7PM-7AM, please contact night-coverage [email or web address] Password [facility] 09/18/2019, 6:45 PM   LOS: 3 days

## 2019-09-19 ENCOUNTER — Ambulatory Visit (HOSPITAL_COMMUNITY): Payer: No Typology Code available for payment source

## 2019-09-19 DIAGNOSIS — I2699 Other pulmonary embolism without acute cor pulmonale: Secondary | ICD-10-CM

## 2019-09-19 LAB — COMPREHENSIVE METABOLIC PANEL
ALT: 62 U/L — ABNORMAL HIGH (ref 0–44)
AST: 34 U/L (ref 15–41)
Albumin: 2.4 g/dL — ABNORMAL LOW (ref 3.5–5.0)
Alkaline Phosphatase: 48 U/L (ref 38–126)
Anion gap: 8 (ref 5–15)
BUN: 18 mg/dL (ref 6–20)
CO2: 27 mmol/L (ref 22–32)
Calcium: 8 mg/dL — ABNORMAL LOW (ref 8.9–10.3)
Chloride: 103 mmol/L (ref 98–111)
Creatinine, Ser: 0.67 mg/dL (ref 0.61–1.24)
GFR calc Af Amer: >60 mL/min (ref >60)
GFR calc non Af Amer: >60 mL/min (ref >60)
Glucose, Bld: 144 mg/dL — ABNORMAL HIGH (ref 70–99)
Potassium: 4.3 mmol/L (ref 3.5–5.1)
Sodium: 138 mmol/L (ref 135–145)
Total Bilirubin: 0.4 mg/dL (ref 0.3–1.2)
Total Protein: 7 g/dL (ref 6.5–8.1)

## 2019-09-19 LAB — CBC WITH DIFFERENTIAL/PLATELET
Abs Immature Granulocytes: 0.52 10*3/uL — ABNORMAL HIGH (ref 0.00–0.07)
Basophils Absolute: 0.1 10*3/uL (ref 0.0–0.1)
Basophils Relative: 1 %
Eosinophils Absolute: 0 10*3/uL (ref 0.0–0.5)
Eosinophils Relative: 0 %
HCT: 38.9 % — ABNORMAL LOW (ref 39.0–52.0)
Hemoglobin: 12.2 g/dL — ABNORMAL LOW (ref 13.0–17.0)
Immature Granulocytes: 7 %
Lymphocytes Relative: 10 %
Lymphs Abs: 0.7 10*3/uL (ref 0.7–4.0)
MCH: 29.1 pg (ref 26.0–34.0)
MCHC: 31.4 g/dL (ref 30.0–36.0)
MCV: 92.8 fL (ref 80.0–100.0)
Monocytes Absolute: 0.3 10*3/uL (ref 0.1–1.0)
Monocytes Relative: 3 %
Neutro Abs: 6.2 10*3/uL (ref 1.7–7.7)
Neutrophils Relative %: 79 %
Platelets: 218 10*3/uL (ref 150–400)
RBC: 4.19 MIL/uL — ABNORMAL LOW (ref 4.22–5.81)
RDW: 12.8 % (ref 11.5–15.5)
WBC: 7.7 10*3/uL (ref 4.0–10.5)
nRBC: 0.8 % — ABNORMAL HIGH (ref 0.0–0.2)

## 2019-09-19 LAB — GLUCOSE, CAPILLARY: Glucose-Capillary: 122 mg/dL — ABNORMAL HIGH (ref 70–99)

## 2019-09-19 LAB — FERRITIN: Ferritin: 465 ng/mL — ABNORMAL HIGH (ref 24–336)

## 2019-09-19 LAB — C-REACTIVE PROTEIN: CRP: 1.3 mg/dL — ABNORMAL HIGH (ref <1.0)

## 2019-09-19 LAB — D-DIMER, QUANTITATIVE: D-Dimer, Quant: 2.88 ug/mL-FEU — ABNORMAL HIGH (ref 0.00–0.50)

## 2019-09-19 LAB — PHOSPHORUS: Phosphorus: 3.4 mg/dL (ref 2.5–4.6)

## 2019-09-19 LAB — MAGNESIUM: Magnesium: 2.4 mg/dL (ref 1.7–2.4)

## 2019-09-19 MED ORDER — APIXABAN 5 MG PO TABS: 10.0000 mg | ORAL_TABLET | Freq: Two times a day (BID) | ORAL | 0 refills | Status: DC

## 2019-09-19 MED ORDER — PREDNISONE 20 MG PO TABS: 60.0000 mg | ORAL_TABLET | Freq: Two times a day (BID) | ORAL | 0 refills | Status: DC

## 2019-09-19 NOTE — Discharge Summary (Signed)
Physician Discharge Summary  Jose Lamb OQH:[id number] DOB: 11/04/1960 DOA: 09/15/2019  PCP: Jose Samples, [name]  Admit date: 09/15/2019 Discharge date: 09/19/2019  Admitted From:Home Disposition:  Home   Recommendations for Outpatient Follow-up:  1. Follow up with PCP in 1-2 weeks 2. Please obtain BMP/CBC in one week 3. Please follow up on the following pending results:    Discharge Condition: Stable CODE STATUS: FULL Diet recommendation: Heart Healthy   Brief/Interim Summary: As per H&P written by Dr. Denton Lamb on 09/15/2019 [name] a59 y.o.malenon-smoker with past medical history relevant for HTN, PUD, glucose intolerance and h/oPAFib whopresents with 57 week history of family members being sick with COVID-19 symptoms and patient previously tested positive for COVID-19 infection  -Over the last 24 hours prior to admission having increasing shortness of breath, in the ED is found to be hypoxic requiring greater than 3 L of oxygen via nasal cannula - WBC 5.4,LFTs WNL, PCT less than 0.10,ferritin 318 -D-dimer greater than 20 -LDH 239,fibrinogen 689,CRP 11.9,chest x-ray consistent with Covid pneumonia  -Currently no vomiting no diarrhea no chest pains palpitations or pleuritic symptoms  -Please note that the patient did receive his [employer] vaccine in April 2021 Covid pneumonia with hypoxia greater than 3 L of oxygen via nasal cannula,IV steroids, supplemental oxygen and IV remdesivir. He did not want to be discharged early for outpt remdesivir despite his medical stability.  He completed 5 days remdesivir in hospital.  He was weaned to RA.  He will go home with 5 more days of steroids.  Discharge Diagnoses:  acute respiratory failure with hypoxia -due to COVID-19 pneumonia and bilateral pulmonary embolism -now on 3L >>>RA -Continue IV steroids and IV remdesivir -Continue to wean oxygen supplementation as tolerated -Continue to  followinflammatory markders -Given elevated D-dimerCTA chestwas done demonstrating bilateral pulmonary embolism with mild right heart strain;  -continue IV heparin>>apixaban -Continue as needed bronchodilators, antitussive, flutter valve and incentive spirometer. -Continue increasing activity as tolerated and following proning position most times of the day. -Continue vitamin C and zinc. -ambulatory pulse ox did not show any desaturation <88%  Acute PE -09/16/19 CTA chest--bilateral PE and patchy GGO -09/16/19 Echo--EF 60-65%, no WMA; normal RV systolic function -continue IV heparin>>>apixaban  Diabetes Mellitus Type 2 -With ongoing hyperglycemia secondary to steroids usage -Follow A1c--6.8 -Continue sliding scale insulin and Levemir  paroxysmal atrial fibrillation -Currently sinus rhythm appreciated on telemetry -optimize electrolytes -Patient will require anticoagulation for acutePEat least for the next 6 months. -09/16/19 Echo as above -Patient was not using any rate control agent prior to admission; as needed beta-blocker has been ordered. -Continue to monitor on telemetry.  class II morbid obesity -Body mass index is 35.98 kg/m. -Low calorie diet, portion control and increase physical activity discussed with patient.  gastroesophageal reflux disease/GI prophylaxis -Continue PPI.   Discharge Instructions   Allergies as of 09/19/2019      Reactions   Asa [aspirin] Other (See Comments)   Nose bleeds      Medication List    STOP taking these medications   meloxicam 15 MG tablet Commonly known as: MOBIC   naproxen 500 MG tablet Commonly known as: NAPROSYN   terbinafine 250 MG tablet Commonly known as: LamISIL     TAKE these medications   apixaban 5 MG Tabs tablet Commonly known as: ELIQUIS Take 2 tablets (10 mg total) by mouth 2 (two) times daily. Through 09/24/19.  Starting 09/25/19 evening take 1 tablet (5mg ) two times daily   predniSONE  20 MG  tablet Commonly known as: DELTASONE Take 3 tablets (60 mg total) by mouth 2 (two) times daily with a meal.       Allergies  Allergen Reactions  . Asa [Aspirin] Other (See Comments)    Nose bleeds    Consultations:  none   Procedures/Studies: CT ANGIO CHEST PE W OR WO CONTRAST  Result Date: 09/16/2019 CLINICAL DATA:  Shortness of breath and cough. Positive COVID test. EXAM: CT ANGIOGRAPHY CHEST WITH CONTRAST TECHNIQUE: Multidetector CT imaging of the chest was performed using the standard protocol during bolus administration of intravenous contrast. Multiplanar CT image reconstructions and MIPs were obtained to evaluate the vascular anatomy. CONTRAST:  141mL OMNIPAQUE IOHEXOL 350 MG/ML SOLN COMPARISON:  Chest x-ray 09/15/2019 prior chest CT 03/26/2018 FINDINGS: Cardiovascular: The heart is normal in size. No pericardial effusion. The aorta is normal in caliber. No dissection. No atherosclerotic calcifications. The branch vessels are patent. Proximal left coronary artery calcifications are noted. Enlargement of the pulmonary arterial trunk is a stable finding and likely reflects pulmonary hypertension. The pulmonary arterial tree is fairly well opacified. There is significant bilateral pulmonary emboli most notable in the right lower lobe. There is slight flattening of the left ventricle but the RV LV ratio is less than 1. Mediastinum/Nodes: Scattered mildly enlarged mediastinal and hilar lymph nodes likely reactive/inflammatory. Lungs/Pleura: Patchy peripheral ground-glass infiltrates consistent with COVID pneumonia. There are also areas of subpleural atelectasis likely related to the pulmonary emboli. No pleural effusions. No worrisome pulmonary lesions. Upper Abdomen: No significant upper abdominal findings. Musculoskeletal: No significant bony findings. Review of the MIP images confirms the above findings. IMPRESSION: 1. Significant bilateral pulmonary emboli. Findings for mild right heart  strain with slight flattening of the left ventricle but the RV LV ratio is less than 1. 2. Normal thoracic aorta. 3. Stable enlarged pulmonary arterial trunk suggesting pulmonary hypertension. 4. Patchy peripheral ground-glass infiltrates consistent with COVID pneumonia. 5. Enlarged mediastinal and hilar lymph nodes, likely reactive/inflammatory. 6. Proximal left coronary artery calcifications. These results will be called to the ordering clinician or representative by the Radiologist Assistant, and communication documented in the PACS or [employer]. Electronically Signed   By: Marijo Sanes M.D.   On: 09/16/2019 10:33   DG Chest Port 1 View  Result Date: 09/15/2019 CLINICAL DATA:  Cough, shortness of breath EXAM: PORTABLE CHEST 1 VIEW COMPARISON:  04/01/2018 FINDINGS: There are bilateral interstitial and patchy alveolar airspace opacities. There is no pleural effusion or pneumothorax. The heart and mediastinal contours are unremarkable. There is no acute osseous abnormality. IMPRESSION: Bilateral interstitial and patchy alveolar airspace opacities concerning for mild interstitial edema versus interstitial infection. Electronically Signed   By: Kathreen Devoid   On: 09/15/2019 07:26   ECHOCARDIOGRAM COMPLETE  Result Date: 09/16/2019    ECHOCARDIOGRAM REPORT   Patient Name:   DAMEIAN CRISMAN Date of Exam: 09/16/2019 Medical Rec #:  [id number]      Height:       71.0 in Accession #:    [id number]     Weight:       257.9 lb Date of Birth:  10/25/1960      BSA:          2.349 m Patient Age:    65 years       BP:           119/82 mmHg Patient Gender: M              HR:  88 bpm. Exam Location:  [facility] Procedure: 2D Echo, Cardiac Doppler and Color Doppler Indications:    Pulmonary Embolus 415.19 / I26.99  History:        Patient has prior history of Echocardiogram examinations, most                 recent 03/24/2014. Risk Factors:Hypertension. Acute respiratory                 disease due to COVID-19  virus,HIV (human immunodeficiency virus                 infection),Pre-diabetes, Obesity.  Sonographer:    Alvino Chapel RCS Referring Phys: [place]  1. Left ventricular ejection fraction, by estimation, is 60 to 65%. The left ventricle has normal function. The left ventricle has no regional wall motion abnormalities. Left ventricular diastolic parameters were normal.  2. Right ventricular systolic function is normal. The right ventricular size is normal.  3. The mitral valve is normal in structure. No evidence of mitral valve regurgitation. No evidence of mitral stenosis.  4. The aortic valve is normal in structure. Aortic valve regurgitation is not visualized. No aortic stenosis is present.  5. The inferior vena cava is normal in size with greater than 50% respiratory variability, suggesting right atrial pressure of 3 mmHg. FINDINGS  Left Ventricle: Left ventricular ejection fraction, by estimation, is 60 to 65%. The left ventricle has normal function. The left ventricle has no regional wall motion abnormalities. Definity contrast agent was given IV to delineate the left ventricular  endocardial borders. The left ventricular internal cavity size was normal in size. There is no left ventricular hypertrophy. Left ventricular diastolic parameters were normal. Right Ventricle: The right ventricular size is normal. No increase in right ventricular wall thickness. Right ventricular systolic function is normal. Left Atrium: Left atrial size was normal in size. Right Atrium: Right atrial size was normal in size. Pericardium: There is no evidence of pericardial effusion. Mitral Valve: The mitral valve is normal in structure. Normal mobility of the mitral valve leaflets. No evidence of mitral valve regurgitation. No evidence of mitral valve stenosis. Tricuspid Valve: The tricuspid valve is normal in structure. Tricuspid valve regurgitation is not demonstrated. No evidence of tricuspid stenosis. Aortic  Valve: The aortic valve is normal in structure. Aortic valve regurgitation is not visualized. No aortic stenosis is present. Pulmonic Valve: The pulmonic valve was normal in structure. Pulmonic valve regurgitation is not visualized. No evidence of pulmonic stenosis. Aorta: The aortic root is normal in size and structure. Venous: The inferior vena cava is normal in size with greater than 50% respiratory variability, suggesting right atrial pressure of 3 mmHg. IAS/Shunts: No atrial level shunt detected by color flow Doppler.  LEFT VENTRICLE PLAX 2D LVIDd:         4.72 cm  Diastology LVIDs:         3.35 cm  LV e' lateral:   16.50 cm/s LV PW:         1.11 cm  LV E/e' lateral: 5.1 LV IVS:        1.10 cm  LV e' medial:    9.03 cm/s LVOT diam:     2.00 cm  LV E/e' medial:  9.3 LV SV:         72 LV SV Index:   31 LVOT Area:     3.14 cm  RIGHT VENTRICLE RV S prime:     19.60 cm/s TAPSE (M-mode):  3.3 cm LEFT ATRIUM              Index       RIGHT ATRIUM           Index LA diam:        4.00 cm  1.70 cm/m  RA Area:     18.90 cm LA Vol (A2C):   109.0 ml 46.40 ml/m RA Volume:   46.80 ml  19.92 ml/m LA Vol (A4C):   63.9 ml  27.20 ml/m LA Biplane Vol: 90.1 ml  38.35 ml/m  AORTIC VALVE LVOT Vmax:   142.00 cm/s LVOT Vmean:  82.100 cm/s LVOT VTI:    0.230 m  AORTA Ao Root diam: 3.90 cm MITRAL VALVE MV Area (PHT): 3.91 cm    SHUNTS MV Decel Time: 194 msec    Systemic VTI:  0.23 m MV E velocity: 84.00 cm/s  Systemic Diam: 2.00 cm MV A velocity: 70.60 cm/s MV E/A ratio:  1.19 Jenkins Rouge MD Electronically signed by Jenkins Rouge MD Signature Date/Time: 09/16/2019/4:37:58 PM    Final         Discharge Exam: Vitals:   09/19/19 0617 09/19/19 0821  BP: 129/81   Pulse: 74   Resp: 20   Temp: 98.1 F (36.7 C)   SpO2: 94% 94%   Vitals:   09/18/19 2026 09/18/19 2115 09/19/19 0617 09/19/19 0821  BP:  131/76 129/81   Pulse: 82 85 74   Resp: 20 20 20    Temp:  98.5 F (36.9 C) 98.1 F (36.7 C)   TempSrc:  Oral Oral     SpO2: 96% 97% 94% 94%  Weight:      Height:        General: Pt is alert, awake, not in acute distress Cardiovascular: RRR, S1/S2 +, no rubs, no gallops Respiratory: bibasilar crackles. No wheeze Abdominal: Soft, NT, ND, bowel sounds + Extremities: no edema, no cyanosis   The results of significant diagnostics from this hospitalization (including imaging, microbiology, ancillary and laboratory) are listed below for reference.    Significant Diagnostic Studies: CT ANGIO CHEST PE W OR WO CONTRAST  Result Date: 09/16/2019 CLINICAL DATA:  Shortness of breath and cough. Positive COVID test. EXAM: CT ANGIOGRAPHY CHEST WITH CONTRAST TECHNIQUE: Multidetector CT imaging of the chest was performed using the standard protocol during bolus administration of intravenous contrast. Multiplanar CT image reconstructions and MIPs were obtained to evaluate the vascular anatomy. CONTRAST:  160mL OMNIPAQUE IOHEXOL 350 MG/ML SOLN COMPARISON:  Chest x-ray 09/15/2019 prior chest CT 03/26/2018 FINDINGS: Cardiovascular: The heart is normal in size. No pericardial effusion. The aorta is normal in caliber. No dissection. No atherosclerotic calcifications. The branch vessels are patent. Proximal left coronary artery calcifications are noted. Enlargement of the pulmonary arterial trunk is a stable finding and likely reflects pulmonary hypertension. The pulmonary arterial tree is fairly well opacified. There is significant bilateral pulmonary emboli most notable in the right lower lobe. There is slight flattening of the left ventricle but the RV LV ratio is less than 1. Mediastinum/Nodes: Scattered mildly enlarged mediastinal and hilar lymph nodes likely reactive/inflammatory. Lungs/Pleura: Patchy peripheral ground-glass infiltrates consistent with COVID pneumonia. There are also areas of subpleural atelectasis likely related to the pulmonary emboli. No pleural effusions. No worrisome pulmonary lesions. Upper Abdomen: No  significant upper abdominal findings. Musculoskeletal: No significant bony findings. Review of the MIP images confirms the above findings. IMPRESSION: 1. Significant bilateral pulmonary emboli. Findings for mild right heart strain with slight flattening  of the left ventricle but the RV LV ratio is less than 1. 2. Normal thoracic aorta. 3. Stable enlarged pulmonary arterial trunk suggesting pulmonary hypertension. 4. Patchy peripheral ground-glass infiltrates consistent with COVID pneumonia. 5. Enlarged mediastinal and hilar lymph nodes, likely reactive/inflammatory. 6. Proximal left coronary artery calcifications. These results will be called to the ordering clinician or representative by the Radiologist Assistant, and communication documented in the PACS or [employer]. Electronically Signed   By: Marijo Sanes M.D.   On: 09/16/2019 10:33   DG Chest Port 1 View  Result Date: 09/15/2019 CLINICAL DATA:  Cough, shortness of breath EXAM: PORTABLE CHEST 1 VIEW COMPARISON:  04/01/2018 FINDINGS: There are bilateral interstitial and patchy alveolar airspace opacities. There is no pleural effusion or pneumothorax. The heart and mediastinal contours are unremarkable. There is no acute osseous abnormality. IMPRESSION: Bilateral interstitial and patchy alveolar airspace opacities concerning for mild interstitial edema versus interstitial infection. Electronically Signed   By: Kathreen Devoid   On: 09/15/2019 07:26   ECHOCARDIOGRAM COMPLETE  Result Date: 09/16/2019    ECHOCARDIOGRAM REPORT   Patient Name:   ZYQUAN CROTTY Date of Exam: 09/16/2019 Medical Rec #:  [id number]      Height:       71.0 in Accession #:    [id number]     Weight:       257.9 lb Date of Birth:  1960-05-04      BSA:          2.349 m Patient Age:    13 years       BP:           119/82 mmHg Patient Gender: M              HR:           88 bpm. Exam Location:  [facility] Procedure: 2D Echo, Cardiac Doppler and Color Doppler Indications:    Pulmonary  Embolus 415.19 / I26.99  History:        Patient has prior history of Echocardiogram examinations, most                 recent 03/24/2014. Risk Factors:Hypertension. Acute respiratory                 disease due to COVID-19 virus,HIV (human immunodeficiency virus                 infection),Pre-diabetes, Obesity.  Sonographer:    Alvino Chapel RCS Referring Phys: [place]  1. Left ventricular ejection fraction, by estimation, is 60 to 65%. The left ventricle has normal function. The left ventricle has no regional wall motion abnormalities. Left ventricular diastolic parameters were normal.  2. Right ventricular systolic function is normal. The right ventricular size is normal.  3. The mitral valve is normal in structure. No evidence of mitral valve regurgitation. No evidence of mitral stenosis.  4. The aortic valve is normal in structure. Aortic valve regurgitation is not visualized. No aortic stenosis is present.  5. The inferior vena cava is normal in size with greater than 50% respiratory variability, suggesting right atrial pressure of 3 mmHg. FINDINGS  Left Ventricle: Left ventricular ejection fraction, by estimation, is 60 to 65%. The left ventricle has normal function. The left ventricle has no regional wall motion abnormalities. Definity contrast agent was given IV to delineate the left ventricular  endocardial borders. The left ventricular internal cavity size was normal in size. There is no left ventricular hypertrophy.  Left ventricular diastolic parameters were normal. Right Ventricle: The right ventricular size is normal. No increase in right ventricular wall thickness. Right ventricular systolic function is normal. Left Atrium: Left atrial size was normal in size. Right Atrium: Right atrial size was normal in size. Pericardium: There is no evidence of pericardial effusion. Mitral Valve: The mitral valve is normal in structure. Normal mobility of the mitral valve leaflets. No evidence  of mitral valve regurgitation. No evidence of mitral valve stenosis. Tricuspid Valve: The tricuspid valve is normal in structure. Tricuspid valve regurgitation is not demonstrated. No evidence of tricuspid stenosis. Aortic Valve: The aortic valve is normal in structure. Aortic valve regurgitation is not visualized. No aortic stenosis is present. Pulmonic Valve: The pulmonic valve was normal in structure. Pulmonic valve regurgitation is not visualized. No evidence of pulmonic stenosis. Aorta: The aortic root is normal in size and structure. Venous: The inferior vena cava is normal in size with greater than 50% respiratory variability, suggesting right atrial pressure of 3 mmHg. IAS/Shunts: No atrial level shunt detected by color flow Doppler.  LEFT VENTRICLE PLAX 2D LVIDd:         4.72 cm  Diastology LVIDs:         3.35 cm  LV e' lateral:   16.50 cm/s LV PW:         1.11 cm  LV E/e' lateral: 5.1 LV IVS:        1.10 cm  LV e' medial:    9.03 cm/s LVOT diam:     2.00 cm  LV E/e' medial:  9.3 LV SV:         72 LV SV Index:   31 LVOT Area:     3.14 cm  RIGHT VENTRICLE RV S prime:     19.60 cm/s TAPSE (M-mode): 3.3 cm LEFT ATRIUM              Index       RIGHT ATRIUM           Index LA diam:        4.00 cm  1.70 cm/m  RA Area:     18.90 cm LA Vol (A2C):   109.0 ml 46.40 ml/m RA Volume:   46.80 ml  19.92 ml/m LA Vol (A4C):   63.9 ml  27.20 ml/m LA Biplane Vol: 90.1 ml  38.35 ml/m  AORTIC VALVE LVOT Vmax:   142.00 cm/s LVOT Vmean:  82.100 cm/s LVOT VTI:    0.230 m  AORTA Ao Root diam: 3.90 cm MITRAL VALVE MV Area (PHT): 3.91 cm    SHUNTS MV Decel Time: 194 msec    Systemic VTI:  0.23 m MV E velocity: 84.00 cm/s  Systemic Diam: 2.00 cm MV A velocity: 70.60 cm/s MV E/A ratio:  1.19 Jenkins Rouge MD Electronically signed by Jenkins Rouge MD Signature Date/Time: 09/16/2019/4:37:58 PM    Final      Microbiology: Recent Results (from the past 240 hour(s))  SARS Coronavirus 2 by RT PCR (hospital order, performed in [place] hospital lab) Nasopharyngeal Nasopharyngeal Swab     Status: Abnormal   Collection Time: 09/15/19  6:41 AM   Specimen: Nasopharyngeal Swab  Result Value Ref Range Status   SARS Coronavirus 2 POSITIVE (A) NEGATIVE Final    Comment: CRITICAL RESULT CALLED TO, READ BACK BY AND VERIFIED WITH: REBBECCA MENTOR,RN @1005  08/09/2021kay (NOTE) SARS-CoV-2 target nucleic acids are DETECTED  SARS-CoV-2 RNA is generally detectable in upper respiratory specimens  during the  acute phase of infection.  Positive results are indicative  of the presence of the identified virus, but do not rule out bacterial infection or co-infection with other pathogens not detected by the test.  Clinical correlation with patient history and  other diagnostic information is necessary to determine patient infection status.  The expected result is negative.  Fact Sheet for Patients:   StrictlyIdeas.no   Fact Sheet for Healthcare Providers:   BankingDealers.co.za    This test is not yet approved or cleared by the [place] FDA and  has been authorized for detection and/or diagnosis of SARS-CoV-2 by FDA under an Emergency Use Authorization (EUA).  This EUA will remain in effect (me aning this test can be used) for the duration of  the COVID-19 declaration under Section 564(b)(1) of the Act, 21 U.S.C. section 360-bbb-3(b)(1), unless the authorization is terminated or revoked sooner.  Performed at [facility], [address], [place], [place] [address]   Blood Culture (routine x 2)     Status: None (Preliminary result)   Collection Time: 09/15/19  7:04 AM   Specimen: BLOOD  Result Value Ref Range Status   Specimen Description BLOOD RIGHT ANTECUBITAL  Final   Special Requests   Final    BOTTLES DRAWN AEROBIC AND ANAEROBIC Blood Culture adequate volume   Culture   Final    NO GROWTH 4 DAYS Performed at [facility], [address] [place], [place] [address]     Report Status PENDING  Incomplete  Blood Culture (routine x 2)     Status: None (Preliminary result)   Collection Time: 09/15/19  7:13 AM   Specimen: BLOOD  Result Value Ref Range Status   Specimen Description BLOOD LEFT HAND  Final   Special Requests   Final    BOTTLES DRAWN AEROBIC AND ANAEROBIC Blood Culture adequate volume   Culture   Final    NO GROWTH 4 DAYS Performed at [facility], [address], [place],  [address]    Report Status PENDING  Incomplete     Labs: Basic Metabolic Panel: Recent Labs  Lab 09/15/19 0705 09/15/19 0705 09/16/19 0445 09/16/19 0445 09/17/19 0602 09/17/19 0602 09/18/19 0417 09/19/19 0657  NA 139  --  139  --  140  --  140 138  K 3.5   < > 4.9   < > 4.2   < > 4.3 4.3  CL 102  --  103  --  103  --  104 103  CO2 28  --  28  --  29  --  27 27  GLUCOSE 137*  --  167*  --  174*  --  214* 144*  BUN 14  --  17  --  21*  --  22* 18  CREATININE 0.80  --  0.78  --  0.72  --  0.79 0.67  CALCIUM 8.3*  --  8.4*  --  8.1*  --  8.3* 8.0*  MG  --   --  2.4  --  2.5*  --  2.4 2.4  PHOS  --   --  2.9  --  2.9  --  2.1* 3.4   < > = values in this interval not displayed.   Liver Function Tests: Recent Labs  Lab 09/15/19 0705 09/16/19 0445 09/17/19 0602 09/18/19 0417 09/19/19 0657  AST 26 21 20 23  34  ALT 43 37 31 37 62*  ALKPHOS 54 56 49 55 48  BILITOT 0.6 0.4 0.4  0.4 0.4  PROT 8.0 8.1 7.4 7.2 7.0  ALBUMIN 2.8* 2.8* 2.7* 2.5* 2.4*   No results for input(s): LIPASE, AMYLASE in the last 168 hours. No results for input(s): AMMONIA in the last 168 hours. CBC: Recent Labs  Lab 09/15/19 0705 09/16/19 0445 09/17/19 0602 09/18/19 0417 09/19/19 0657  WBC 5.4 7.9 8.1 8.0 7.7  NEUTROABS 4.4 7.0 6.9 6.6 6.2  HGB 12.4* 12.1* 11.8* 11.6* 12.2*  HCT 39.8 39.1 38.7* 37.4* 38.9*  MCV 93.2 94.0 94.6 94.0 92.8  PLT 205 226 204 225 218   Cardiac Enzymes: No results for input(s): CKTOTAL, CKMB, CKMBINDEX, TROPONINI in the last 168  hours. BNP: Invalid input(s): POCBNP CBG: Recent Labs  Lab 09/18/19 0848 09/18/19 1125 09/18/19 1605 09/18/19 2118 09/19/19 0742  GLUCAP 169* 170* 212* 175* 122*    Time coordinating discharge:  36 minutes  Signed:  Orson Eva, DO [facility] Pager: [phone] 09/19/2019, 12:50 PM

## 2019-09-19 NOTE — Progress Notes (Signed)
Patient IVs removed. AVS discussed with patient, he had no questions or concerns.   Discharged off unit via wheelchair accompanied by RN.

## 2019-09-19 NOTE — Plan of Care (Signed)
  Problem: Education: Goal: Knowledge of General Education information will improve Description: Including pain rating scale, medication(s)/side effects and non-pharmacologic comfort measures Outcome: Completed/Met   Problem: Health Behavior/Discharge Planning: Goal: Ability to manage health-related needs will improve Outcome: Completed/Met   Problem: Clinical Measurements: Goal: Ability to maintain clinical measurements within normal limits will improve Outcome: Completed/Met Goal: Will remain free from infection Outcome: Completed/Met Goal: Diagnostic test results will improve Outcome: Completed/Met Goal: Respiratory complications will improve Outcome: Completed/Met Goal: Cardiovascular complication will be avoided Outcome: Completed/Met   Problem: Activity: Goal: Risk for activity intolerance will decrease Outcome: Completed/Met   Problem: Nutrition: Goal: Adequate nutrition will be maintained Outcome: Completed/Met   Problem: Coping: Goal: Level of anxiety will decrease Outcome: Completed/Met   Problem: Elimination: Goal: Will not experience complications related to bowel motility Outcome: Completed/Met Goal: Will not experience complications related to urinary retention Outcome: Completed/Met   Problem: Pain Managment: Goal: General experience of comfort will improve Outcome: Completed/Met   Problem: Safety: Goal: Ability to remain free from injury will improve Outcome: Completed/Met   Problem: Skin Integrity: Goal: Risk for impaired skin integrity will decrease Outcome: Completed/Met   Problem: Education: Goal: Knowledge of risk factors and measures for prevention of condition will improve Outcome: Completed/Met   Problem: Coping: Goal: Psychosocial and spiritual needs will be supported Outcome: Completed/Met

## 2019-09-20 LAB — CULTURE, BLOOD (ROUTINE X 2)
Culture: NO GROWTH
Culture: NO GROWTH
Special Requests: ADEQUATE
Special Requests: ADEQUATE

## 2019-10-03 ENCOUNTER — Encounter: Payer: Self-pay | Admitting: Emergency Medicine

## 2019-10-03 ENCOUNTER — Other Ambulatory Visit: Payer: Self-pay

## 2019-10-03 ENCOUNTER — Ambulatory Visit
Admission: EM | Admit: 2019-10-03 | Discharge: 2019-10-03 | Disposition: A | Discharge status: Home / Self Care | Payer: No Typology Code available for payment source | Attending: Emergency Medicine | Admitting: Emergency Medicine

## 2019-10-03 DIAGNOSIS — L03115 Cellulitis of right lower limb: Secondary | ICD-10-CM

## 2019-10-03 DIAGNOSIS — L03116 Cellulitis of left lower limb: Secondary | ICD-10-CM | POA: Diagnosis not present

## 2019-10-03 MED ORDER — DOXYCYCLINE HYCLATE 100 MG PO CAPS
100.0000 mg | ORAL_CAPSULE | Freq: Two times a day (BID) | ORAL | 0 refills | Status: DC
Start: 2019-10-03 — End: 2019-10-29

## 2019-10-03 NOTE — Discharge Instructions (Addendum)
Doxycycline was prescribed take as directed Follow-up with PCP Return or go to ED for worsening of symptoms

## 2019-10-03 NOTE — ED Triage Notes (Signed)
Pt has swelling to bilateral lower extremities that his pcp is aware of, pt is taking lasix. Pt just noticed last night to bilateral lower extremities.

## 2019-10-03 NOTE — ED Provider Notes (Signed)
[place]   [id number] 10/03/19 Arrival Time: [place]   Chief Complaint  Patient presents with  . Leg Swelling     SUBJECTIVE: History from: patient.  Jose Lamb is a 59 y.o. male with history of bilateral lower extremity edema and currently taking Lasix with potassium presented to the urgent care with a complaint of redness area to bilateral extremities for the past 1 day.  Denies any precipitating event.  Localized redness to bilateral leg.  Described as painful.  He has tried OTC medications without relief.  His symptoms are made worse with ROM.  He denies similar symptoms in the past.     ROS: As per HPI.  All other pertinent ROS negative.     Past Medical History:  Diagnosis Date  . 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 Hypertension   . Perforated duodenal ulcer ([place]) 2015   contained, no surg, spontaneous resolution   Past Surgical History:  Procedure Laterality Date  . COLONOSCOPY  Sept 2015   [place]: transverse colon polyp with pathology reporting lymphoid nodule   . ESOPHAGOGASTRODUODENOSCOPY N/A 03/30/2014   Procedure: ESOPHAGOGASTRODUODENOSCOPY (EGD);  Surgeon: Daneil Dolin, MD;  [facility];  Service: Endoscopy;  Laterality: N/A;  215  . None     Allergies  Allergen Reactions  . Asa [Aspirin] Other (See Comments)    Nose bleeds   No current facility-administered medications on file prior to encounter.   Current Outpatient Medications on File Prior to Encounter  Medication Sig Dispense Refill  . apixaban (ELIQUIS) 5 MG TABS tablet Take 2 tablets (10 mg total) by mouth 2 (two) times daily. Through 09/24/19.  Starting 09/25/19 evening take 1 tablet (5mg ) two times daily 72 tablet 0  . predniSONE (DELTASONE) 20 MG tablet Take 3 tablets (60 mg total) by mouth 2 (two) times daily with a meal. 30 tablet 0   Social History   Socioeconomic History  . Marital status: Married    Spouse name: Not on file  . Number of children: Not on file    . Years of education: Not on file  . Highest education level: Not on file  Occupational History  . Occupation: Environmental education officer  Tobacco Use  . Smoking status: Never Smoker  . Smokeless tobacco: Never Used  Vaping Use  . Vaping Use: Never used  Substance and Sexual Activity  . Alcohol use: No    Alcohol/week: 0.0 standard drinks  . Drug use: No  . Sexual activity: Yes    Partners: Female  Other Topics Concern  . Not on file  Social History Narrative  . Not on file   Social Determinants of Health   Financial Resource Strain:   . Difficulty of Paying Living Expenses: Not on file  Food Insecurity:   . Worried About Charity fundraiser in the Last Year: Not on file  . Ran Out of Food in the Last Year: Not on file  Transportation Needs:   . Lack of Transportation (Medical): Not on file  . Lack of Transportation (Non-Medical): Not on file  Physical Activity:   . Days of Exercise per Week: Not on file  . Minutes of Exercise per Session: Not on file  Stress:   . Feeling of Stress : Not on file  Social Connections:   . Frequency of Communication with Friends and Family: Not on file  . Frequency of Social Gatherings with Friends and Family: Not on file  . Attends Religious Services: Not  on file  . Active Member of Clubs or Organizations: Not on file  . Attends Archivist Meetings: Not on file  . Marital Status: Not on file  Intimate Partner Violence:   . Fear of Current or Ex-Partner: Not on file  . Emotionally Abused: Not on file  . Physically Abused: Not on file  . Sexually Abused: Not on file   Family History  Problem Relation Age of Onset  . Ulcers Mother   . Heart Problems Mother   . Colon cancer Neg Hx     OBJECTIVE:  Vitals:   10/03/19 1006 10/03/19 1008  BP:  118/69  Pulse:  89  Resp:  19  Temp:  99 F (37.2 C)  TempSrc:  Oral  SpO2:  97%  Weight: 279 lb (126.6 kg)   Height: 5\' 11"  (1.803 m)      Physical Exam Vitals and nursing note reviewed.   Constitutional:      General: He is not in acute distress.    Appearance: Normal appearance. He is normal weight. He is not ill-appearing, toxic-appearing or diaphoretic.  Cardiovascular:     Rate and Rhythm: Normal rate and regular rhythm.     Pulses: Normal pulses.     Heart sounds: Normal heart sounds. No murmur heard.  No friction rub. No gallop.   Pulmonary:     Effort: Pulmonary effort is normal. No respiratory distress.     Breath sounds: Normal breath sounds. No stridor. No wheezing, rhonchi or rales.  Chest:     Chest wall: No tenderness.  Skin:    Findings: Erythema and rash present.     Comments: Bilateral patch/rash on lower extremities with erythema  Neurological:     Mental Status: He is alert and oriented to person, place, and time.     LABS:  No results found for this or any previous visit (from the past 24 hour(s)).   ASSESSMENT & PLAN:  1. Cellulitis of leg, right   2. Cellulitis of leg, left     Meds ordered this encounter  Medications  . doxycycline (VIBRAMYCIN) 100 MG capsule    Sig: Take 1 capsule (100 mg total) by mouth 2 (two) times daily.    Dispense:  20 capsule    Refill:  0    Discharge Instructions Doxycycline was prescribed take as directed Follow-up with PCP Return or go to ED for worsening of symptoms  Reviewed expectations re: course of current medical issues. Questions answered. Outlined signs and symptoms indicating need for more acute intervention. Patient verbalized understanding. After Visit Summary given.      Note: This document was prepared using Dragon voice recognition software and may include unintentional dictation errors.      Emerson Monte, FNP 10/03/19 1023

## 2019-10-14 ENCOUNTER — Telehealth: Payer: Self-pay | Admitting: Orthopaedic Surgery

## 2019-10-14 NOTE — Telephone Encounter (Signed)
Call received today via voice message - [employer], [phone] - contact for patient's worker's comp; states patient has had to postpone physical therapy. Ref# [id number].

## 2019-10-29 ENCOUNTER — Ambulatory Visit (INDEPENDENT_AMBULATORY_CARE_PROVIDER_SITE_OTHER): Payer: No Typology Code available for payment source | Admitting: Cardiology

## 2019-10-29 ENCOUNTER — Other Ambulatory Visit: Payer: Self-pay

## 2019-10-29 ENCOUNTER — Encounter: Payer: Self-pay | Admitting: Cardiology

## 2019-10-29 ENCOUNTER — Encounter (INDEPENDENT_AMBULATORY_CARE_PROVIDER_SITE_OTHER): Payer: Self-pay

## 2019-10-29 VITALS — BP 116/78 | HR 98 | Ht 71.0 in | Wt 268.0 lb

## 2019-10-29 DIAGNOSIS — R600 Localized edema: Secondary | ICD-10-CM | POA: Diagnosis not present

## 2019-10-29 DIAGNOSIS — I2699 Other pulmonary embolism without acute cor pulmonale: Secondary | ICD-10-CM

## 2019-10-29 DIAGNOSIS — R0602 Shortness of breath: Secondary | ICD-10-CM | POA: Diagnosis not present

## 2019-10-29 DIAGNOSIS — Z8679 Personal history of other diseases of the circulatory system: Secondary | ICD-10-CM

## 2019-10-29 DIAGNOSIS — Z86718 Personal history of other venous thrombosis and embolism: Secondary | ICD-10-CM

## 2019-10-29 NOTE — Progress Notes (Signed)
Cardiology Office Note  Date: 10/29/2019   ID: Jose Lamb, DOB Feb 14, 1960, MRN [id number]  PCP:  [name], [name]  Cardiologist:  Rozann Lesches, MD Electrophysiologist:  None   Chief Complaint  Patient presents with  . Shortness of Breath    History of Present Illness: Jose Lamb is a 59 y.o. male former patient of Dr. Johnsie Cancel and Dr. Bronson Ing now referred back to the office by Ms. Inocente Salles with [facility] for the evaluation of shortness of breath and leg swelling.  This is my first meeting with him today.  I reviewed extensive records and updated the chart.  Records indicate hospitalization in August with hypoxic respiratory failure associated with COVID-19 pneumonia (received J&J vaccine in April).  He was treated with oxygen supplementation and remdesivir as well as steroids.  Also concurrently diagnosed with bilateral pulmonary emboli.  Echocardiogram from that time is noted below, LVEF 60 to 65% and overall normal RV contraction.  He was treated initially with heparin and subsequently converted to Eliquis.  He is here today with his wife.  He states that since discharge from the hospital he has continued to have shortness of breath with activity, intermittent nonproductive coughing, somewhat pleuritic discomfort on the left side of his chest, also swelling in his legs bilaterally.  He states that he has been taking Eliquis as directed.  No sense of palpitations.  He was noted to have PVCs by ECG at PCP office.  No syncope.  Past Medical History:  Diagnosis Date  . Atrial fibrillation ([place])    In the setting of hyperthyroidism  . Bilateral pulmonary embolism [facility])    Diagnosed August 2021  . Essential hypertension   . 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 Perforated duodenal ulcer ([place]) 2015   Contained without need for surgical intervention  . Pneumonia due to COVID-19 virus    Diagnosed August 2021    Past Surgical History:  Procedure Laterality Date  .  COLONOSCOPY  Sept 2015   [place]: transverse colon polyp with pathology reporting lymphoid nodule   . ESOPHAGOGASTRODUODENOSCOPY N/A 03/30/2014   Procedure: ESOPHAGOGASTRODUODENOSCOPY (EGD);  Surgeon: Daneil Dolin, MD;  [facility];  Service: Endoscopy;  Laterality: N/A;  215    Current Outpatient Medications  Medication Sig Dispense Refill  . albuterol (VENTOLIN HFA) 108 (90 Base) MCG/ACT inhaler SMARTSIG:1-2 Puff(s) By Mouth Every 4 Hours PRN    . apixaban (ELIQUIS) 5 MG TABS tablet Take 2 tablets (10 mg total) by mouth 2 (two) times daily. Through 09/24/19.  Starting 09/25/19 evening take 1 tablet (5mg ) two times daily 72 tablet 0  . furosemide (LASIX) 40 MG tablet Take 40 mg by mouth 2 (two) times daily as needed.    [name] LANTUS SOLOSTAR 100 UNIT/ML Solostar Pen PLEASE SEE ATTACHED FOR DETAILED DIRECTIONS    . Potassium Chloride ER 20 MEQ TBCR Take 1 tablet by mouth 2 (two) times daily.     No current facility-administered medications for this visit.   Allergies:  Asa [aspirin]   Social History: The patient  reports that he has never smoked. He has never used smokeless tobacco. He reports that he does not drink alcohol and does not use drugs.   Family History: The patient's family history includes Heart Problems in his mother; Ulcers in his mother.   ROS:   No fevers or chills.  Physical Exam: VS:  BP 116/78   Pulse 98   Ht 5\' 11"  (1.803 m)  Wt 268 lb (121.6 kg)   SpO2 95%   BMI 37.38 kg/m , BMI Body mass index is 37.38 kg/m.  Wt Readings from Last 3 Encounters:  10/29/19 268 lb (121.6 kg)  10/03/19 279 lb (126.6 kg)  09/15/19 257 lb 15 oz (117 kg)    General: Obese male, no distress. HEENT: Conjunctiva and lids normal, wearing a mask. Neck: Supple, no elevated JVP or carotid bruits, no thyromegaly. Lungs: Decreased breath sounds without crackles or egophony. Cardiac: Regular rate and rhythm with occasional ectopic beat consistent with PVC, no S3 or  significant systolic murmur, no pericardial rub. Abdomen: Soft, bowel sounds present. Extremities: Bilateral lower leg edema, distal pulses 2+. Skin: Warm and dry. Musculoskeletal: No kyphosis. Neuropsychiatric: Alert and oriented x3, affect grossly appropriate.  ECG:  An ECG dated 09/15/2019 was personally reviewed today and demonstrated:  Sinus rhythm with decreased R wave progression and nonspecific T wave changes.  Recent Labwork: 09/19/2019: ALT 62; AST 34; BUN 18; Creatinine, Ser 0.67; Hemoglobin 12.2; Magnesium 2.4; Platelets 218; Potassium 4.3; Sodium 138     Component Value Date/Time   TRIG 92 09/15/2019 0705    Other Studies Reviewed Today:  Echocardiogram 09/16/2019: 1. Left ventricular ejection fraction, by estimation, is 60 to 65%. The  left ventricle has normal function. The left ventricle has no regional  wall motion abnormalities. Left ventricular diastolic parameters were  normal.  2. Right ventricular systolic function is normal. The right ventricular  size is normal.  3. The mitral valve is normal in structure. No evidence of mitral valve  regurgitation. No evidence of mitral stenosis.  4. The aortic valve is normal in structure. Aortic valve regurgitation is  not visualized. No aortic stenosis is present.  5. The inferior vena cava is normal in size with greater than 50%  respiratory variability, suggesting right atrial pressure of 3 mmHg.   Chest CTA 09/16/2019: IMPRESSION: 1. Significant bilateral pulmonary emboli. Findings for mild right heart strain with slight flattening of the left ventricle but the RV LV ratio is less than 1. 2. Normal thoracic aorta. 3. Stable enlarged pulmonary arterial trunk suggesting pulmonary hypertension. 4. Patchy peripheral ground-glass infiltrates consistent with COVID pneumonia. 5. Enlarged mediastinal and hilar lymph nodes, likely reactive/inflammatory. 6. Proximal left coronary artery calcifications.  Assessment  and Plan:  1.  Persistent shortness of breath, intermittent coughing, occasional pleuritic chest discomfort.  Reports no hemoptysis, no fevers or chills.  Also having bilateral leg swelling.  He is not hypoxic today on room air.  He was diagnosed with COVID-19 pneumonia and hypoxic respiratory failure in August at which point he was hospitalized with supportive measures including remdesivir and steroids.  Also diagnosed at that time with bilateral pulmonary emboli and has been on Eliquis as an outpatient.  Echocardiogram during his hospital stay showed normal LVEF at 60 to 65%, also normal RV contraction although CTA did suggest some findings of mild right heart strain.  I have recommended a follow-up limited echocardiogram to ensure no significant change in LV or RV contraction.  Also will obtain lower extremity venous Dopplers to assess for any residual degree of DVT that might also be contributing to his leg swelling (which is probably more related to his diagnosis of pulmonary emboli and RV strain).  I have encouraged him to make sure that he contacts his PCP to get refills on Eliquis as this should be continued for at least 6 months.  I am also referring him to [facility] to establish  further follow-up of these issues.  2.  History of atrial fibrillation documented in the past in the setting of hyperthyroidism.  Per review of records CHA2DS2-VASc score was 1 and he had not been anticoagulated long-term, last assessed by Dr. Bronson Ing.  He states that he was recently diagnosed with type 2 diabetes mellitus however, which increases the thromboembolic risk and therefore Eliquis may need to be considered as a long-term therapy presuming he tolerates it.  Fortunately, he did not experience any breakthrough arrhythmia during his recent hospital evaluation.  Medication Adjustments/Labs and Tests Ordered: Current medicines are reviewed at length with the patient today.  Concerns regarding medicines are  outlined above.   Tests Ordered: Orders Placed This Encounter  Procedures  . Ambulatory referral to Pulmonology  . ECHOCARDIOGRAM LIMITED  . VAS [place] LOWER EXTREMITY VENOUS (DVT)    Medication Changes: No orders of the defined types were placed in this encounter.   Disposition:  Follow up test results.  Signed, Satira Sark, MD, [facility] 10/29/2019 1:39 PM    [place] [facility] [address], [place], [place] [address] Phone: [phone]; Fax: [phone]

## 2019-10-29 NOTE — Patient Instructions (Addendum)
Medication Instructions:  Your physician recommends that you continue on your current medications as directed. Please refer to the Current Medication list given to you today.  PLEASE MAKE SURE YOUR PRIMARY CARE REFILLS YOUR ELIQUIS *If you need a refill on your cardiac medications before your next appointment, please call your pharmacy*   Lab Work: None today If you have labs (blood work) drawn today and your tests are completely normal, you will receive your results only by: [name] MyChart Message (if you have MyChart) OR . A paper copy in the mail If you have any lab test that is abnormal or we need to change your treatment, we will call you to review the results.   Testing/Procedures: Your physician has requested that you have a LIMITED echocardiogram in the [facility] office. Echocardiography is a painless test that uses sound waves to create images of your heart. It provides your doctor with information about the size and shape of your heart and how well your heart's chambers and valves are working. This procedure takes approximately one hour. There are no restrictions for this procedure.    Your physician has requested that you have a lower extremity venous duplex. This test is an ultrasound of the veins in the legs . It looks at venous blood flow that carries blood from the heart to the legs . Allow one hour for a Lower Venous exam. . There are no restrictions or special instructions.    Follow-Up: At [facility], you and your health needs are our priority.  As part of our continuing mission to provide you with exceptional heart care, we have created designated Provider Care Teams.  These Care Teams include your primary Cardiologist (physician) and Advanced Practice Providers (APPs -  Physician Assistants and Nurse Practitioners) who all work together to provide you with the care you need, when you need it.  We recommend signing up for the patient portal called "MyChart".  Sign up  information is provided on this After Visit Summary.  MyChart is used to connect with patients for Virtual Visits (Telemedicine).  Patients are able to view lab/test results, encounter notes, upcoming appointments, etc.  Non-urgent messages can be sent to your provider as well.   To learn more about what you can do with MyChart, go to [email or web address].    Your next appointment:  We will call you with results.     ELIQUIS 5 MG SAMPLES, [place], LOT: [id number], EXP 4/22

## 2019-11-13 ENCOUNTER — Other Ambulatory Visit: Payer: Self-pay | Admitting: Cardiology

## 2019-11-13 DIAGNOSIS — M7989 Other specified soft tissue disorders: Secondary | ICD-10-CM

## 2019-11-13 DIAGNOSIS — U071 COVID-19: Secondary | ICD-10-CM

## 2019-11-13 DIAGNOSIS — R0602 Shortness of breath: Secondary | ICD-10-CM

## 2019-11-27 ENCOUNTER — Other Ambulatory Visit: Payer: Self-pay

## 2019-11-27 ENCOUNTER — Ambulatory Visit (INDEPENDENT_AMBULATORY_CARE_PROVIDER_SITE_OTHER): Payer: No Typology Code available for payment source

## 2019-11-27 DIAGNOSIS — R0602 Shortness of breath: Secondary | ICD-10-CM

## 2019-11-27 DIAGNOSIS — M7989 Other specified soft tissue disorders: Secondary | ICD-10-CM

## 2019-11-27 DIAGNOSIS — I2699 Other pulmonary embolism without acute cor pulmonale: Secondary | ICD-10-CM

## 2019-11-27 DIAGNOSIS — U071 COVID-19: Secondary | ICD-10-CM

## 2019-11-27 DIAGNOSIS — R600 Localized edema: Secondary | ICD-10-CM

## 2019-11-27 LAB — ECHOCARDIOGRAM LIMITED
Calc EF: 61.9 %
S' Lateral: 3 cm
Single Plane A2C EF: 60 %
Single Plane A4C EF: 64.9 %

## 2020-01-08 ENCOUNTER — Other Ambulatory Visit: Payer: Self-pay

## 2020-01-08 ENCOUNTER — Encounter: Payer: Self-pay | Admitting: Internal Medicine

## 2020-01-08 ENCOUNTER — Ambulatory Visit (INDEPENDENT_AMBULATORY_CARE_PROVIDER_SITE_OTHER): Payer: No Typology Code available for payment source | Admitting: Internal Medicine

## 2020-01-08 DIAGNOSIS — I2699 Other pulmonary embolism without acute cor pulmonale: Secondary | ICD-10-CM

## 2020-01-08 DIAGNOSIS — J069 Acute upper respiratory infection, unspecified: Secondary | ICD-10-CM

## 2020-01-08 DIAGNOSIS — U071 COVID-19: Secondary | ICD-10-CM | POA: Diagnosis not present

## 2020-01-08 MED ORDER — FAMOTIDINE 20 MG PO TABS: ORAL_TABLET | ORAL | 11 refills | Status: DC

## 2020-01-08 MED ORDER — PANTOPRAZOLE SODIUM 40 MG PO TBEC: 40.0000 mg | DELAYED_RELEASE_TABLET | Freq: Every day | ORAL | 2 refills | Status: DC

## 2020-01-08 NOTE — Assessment & Plan Note (Signed)
See CTa  09/16/19 with RV ok on Echo ok and RA pressure 3  09/16/19 and 11/27/19  - venous dopplers 11/27/19 nl   Main risk factor is moderate obesity , clearly provoked by Covid, so rec complete 6 m rx in Feb 10/21 and no need for longer term rx  Discussed in detail all the  indications, usual  risks and alternatives  relative to the benefits with patient who agrees to proceed with Rx as outlined.             Each maintenance medication was reviewed in detail including emphasizing most importantly the difference between maintenance and prns and under what circumstances the prns are to be triggered using an action plan format where appropriate.  Total time for H and P, chart review, counseling,  directly observing portions of ambulatory 02 saturation study/  and generating customized AVS unique to this office visit / charting = 62 min

## 2020-01-08 NOTE — Assessment & Plan Note (Addendum)
See admit 09/15/19 d/c off 02 -  01/08/2020   Walked RA  approx   400  ft  @ very fast pace  stopped due to sob with sats still 95%     Clinically has completely recovered x for a nagging cough that is not typical of ILD but more c/w  Upper airway cough syndrome (previously labeled PNDS),  is so named because it's frequently impossible to sort out how much is  CR/sinusitis with freq throat clearing (which can be related to primary GERD)   vs  causing  secondary (" extra esophageal")  GERD from wide swings in gastric pressure that occur with throat clearing, often  promoting self use of mint and menthol lozenges that reduce the lower esophageal sphincter tone and exacerbate the problem further in a cyclical fashion.   These are the same pts (now being labeled as having "irritable larynx syndrome" by some cough centers) who not infrequently have a history of having failed to tolerate ace inhibitors,  dry powder inhalers or biphosphonates or report having atypical/extraesophageal reflux symptoms that don't respond to standard doses of PPI  and are easily confused as having aecopd or asthma flares by even experienced allergists/ pulmonologists (myself included).   rec max rx for gerd/ complete w/u for post covid cough/fatigue/ sob and regroup in 2 weeks

## 2020-01-08 NOTE — Progress Notes (Signed)
Ziyan Schoon, male    DOB: [birth year]    MRN: [id number]   Brief patient profile:  75 yobm never smoker grew up in [place] able to do sports but by late 20s began to need to start saba prn with season change then admit with covid :  Admit date: 09/15/2019 Discharge date: 09/19/2019 Recommendations for Outpatient Follow-up:  1. Follow up with PCP in 1-2 weeks 2. Please obtain BMP/CBC in one week 3. Please follow up on the following pending results:  Discharge Condition: Stable CODE STATUS: FULL Diet recommendation: Heart Healthy   Brief/Interim Summary: As per H&P written by Dr. [name] on /89/2021 [name] a20 y.o.malenon-smoker with past medical history relevant for HTN, PUD, glucose intolerance and h/oPAFib whopresents with a 1 week history of family members being sick with COVID-19 symptoms and patient previously tested positive for COVID-19 infection  -Over the last 24 hours prior to admission having increasing shortness of breath, in the ED is found to be hypoxic requiring greater than 3 L of oxygen via nasal cannula - WBC 5.4,LFTs WNL, PCT less than 0.10,ferritin 318 -D-dimer greater than 20 -LDH 239,fibrinogen 689,CRP 11.9,chest x-ray consistent with Covid pneumonia  -Currently no vomiting no diarrhea no chest pains palpitations or pleuritic symptoms  -Please note that the patient did receive his [employer] vaccine in April 2021 Covid pneumonia with hypoxia greater than 3 L of oxygen via nasal cannula,IV steroids, supplemental oxygen and IV remdesivir. He did not want to be discharged early for outpt remdesivir despite his medical stability.  He completed 5 days remdesivir in hospital.  He was weaned to RA.  He will go home with 5 more days of steroids.  Discharge Diagnoses:  acute respiratory failure with hypoxia -due to COVID-19 pneumonia and bilateral pulmonary embolism -now on 3L [place]>>>RA -Continue IV  steroids and IV remdesivir -Continue to wean oxygen supplementation as tolerated -Continue to followinflammatory markders -Given elevated D-dimerCTA chestwas done demonstrating bilateral pulmonary embolism with mild right heart strain;  -continue IV heparin>>apixaban -Continue as needed bronchodilators, antitussive, flutter valve and incentive spirometer. -Continue increasing activity as tolerated and following proning position most times of the day. -Continue vitamin C and zinc. -ambulatory pulse ox did not show any desaturation <88%  Acute PE -09/16/19 CTA chest--bilateral PE and patchy GGO -09/16/19 Echo--EF 60-65%, no WMA; normal RV systolic function -continue IV heparin>>>apixaban  Diabetes Mellitus Type 2 -With ongoing hyperglycemia secondary to steroids usage -Follow A1c--6.8 -Continue sliding scale insulin and Levemir  paroxysmal atrial fibrillation -Currently sinus rhythm appreciated on telemetry -optimize electrolytes -Patient will require anticoagulation for acutePEat least for the next 6 months. -09/16/19 Echo as above -Patient was not using any rate control agent prior to admission; as needed beta-blocker has been ordered. -Continue to monitor on telemetry.  class II morbid obesity -Body mass index is 35.98 kg/m. -Low calorie diet, portion control and increase physical activity discussed with patient.  gastroesophageal reflux disease/GI prophylaxis -Continue PPI.     History of Present Illness  01/08/2020  Pulmonary/ 1st office eval/ Melvyn Novas / [facility] post covid ov  Chief Complaint  Patient presents with  . Pulmonary Consult    Referred by Dr. Rozann Lesches for bilateral PE.  Pt dx with Covid 19 09/15/19 and was hospitalized. He has DOE and swelling in his feet.   Dyspnea:  200 ft stops p gets tired does not check sats / able to return to work since Dec 08 2019 Cough: better but coughs  esp with cold or hot air  Sleep: at 60 degrees o/w cough -  new since covid SABA use: helps breathing not the cough   No obvious day to day or daytime variability or assoc excess/ purulent sputum or mucus plugs or hemoptysis or cp or chest tightness, subjective wheeze or overt sinus or hb symptoms.   Sleeping  without nocturnal  or early am exacerbation  of respiratory  c/o's or need for noct saba. Also denies any obvious fluctuation of symptoms with weather or environmental changes or other aggravating or alleviating factors except as outlined above   No unusual exposure hx or h/o childhood pna/ asthma or knowledge of premature birth.  Current Allergies, Complete Past Medical History, Past Surgical History, Family History, and Social History were reviewed in [employer] record.  ROS  The following are not active complaints unless bolded Hoarseness, sore throat, dysphagia, dental problems, itching, sneezing,  nasal congestion or discharge of excess mucus or purulent secretions, ear ache,   fever, chills, sweats, unintended wt loss or wt gain, classically pleuritic or exertional cp,  orthopnea pnd or arm/hand swelling  or leg swelling, presyncope, palpitations, abdominal pain, anorexia, nausea, vomiting, diarrhea  or change in bowel habits or change in bladder habits, change in stools or change in urine, dysuria, hematuria,  rash, arthralgias, visual complaints, headache, numbness, weakness or ataxia or problems with walking or coordination,  change in mood or  memory.           Past Medical History:  Diagnosis Date  . Atrial fibrillation ([place])    In the setting of hyperthyroidism  . Bilateral pulmonary embolism [facility])    Diagnosed August 2021  . Essential hypertension   . 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 Perforated duodenal ulcer ([facility]) 2015   Contained without need for surgical intervention  . Pneumonia due to COVID-19 virus    Diagnosed August 2021    Outpatient Medications Prior to Visit  Medication Sig Dispense Refill   . albuterol (VENTOLIN HFA) 108 (90 Base) MCG/ACT inhaler SMARTSIG:1-2 Puff(s) By Mouth Every 4 Hours PRN    . apixaban (ELIQUIS) 5 MG TABS tablet Take 2 tablets (10 mg total) by mouth 2 (two) times daily. Through 09/24/19.  Starting 09/25/19 evening take 1 tablet (5mg ) two times daily 72 tablet 0  . furosemide (LASIX) 40 MG tablet Take 40 mg by mouth 2 (two) times daily as needed.    [name] LANTUS SOLOSTAR 100 UNIT/ML Solostar Pen PLEASE SEE ATTACHED FOR DETAILED DIRECTIONS    . Potassium Chloride ER 20 MEQ TBCR Take 1 tablet by mouth 2 (two) times daily.     No facility-administered medications prior to visit.     Objective:     BP 112/66 (BP Location: Left Arm, Cuff Size: Large)   Pulse 89   Temp (!) 97.1 F (36.2 C) (Temporal)   Ht 5\' 11"  (1.803 m)   Wt 265 lb (120.2 kg)   SpO2 97% Comment: on RA  BMI 36.96 kg/m   SpO2: 97 % (on RA)   Pleasant amb obese bm nad    HEENT : pt wearing mask not removed for exam due to covid -19 concerns.    NECK :  without JVD/Nodes/TM/ nl carotid upstrokes bilaterally   LUNGS: no acc muscle use,  Nl contour chest which is clear to A and P bilaterally without cough on insp or exp maneuvers   CV:  RRR  no s3 or murmur or increase  in P2, and trace ankle edema bil symmetric  ABD:  soft and nontender with nl inspiratory excursion in the supine position. No bruits or organomegaly appreciated, bowel sounds nl  MS:  Nl gait/ ext warm without deformities, calf tenderness, cyanosis or clubbing No obvious joint restrictions   SKIN: warm and dry without lesions    NEURO:  alert, approp, nl sensorium with  no motor or cerebellar deficits apparent.     I personally reviewed images and agree with radiology impression as follows:   Chest CTa 09/16/19  1. Significant bilateral pulmonary emboli. Findings for mild right heart strain with slight flattening of the left ventricle but the RV LV ratio is less than 1. 2. Normal thoracic aorta. 3. Stable  enlarged pulmonary arterial trunk suggesting pulmonary hypertension. 4. Patchy peripheral ground-glass infiltrates consistent with COVID pneumonia. 5. Enlarged mediastinal and hilar lymph nodes, likely reactive/inflammatory. 6. Proximal left coronary artery calcifications.      CXR PA and Lateral:   01/08/2020 :    I personally reviewed images and agree with radiology impression as follows:    did not go to xray  Did not go to lab     Assessment   Acute respiratory disease due to COVID-19 virus See admit 09/15/19 d/c off 02 -  01/08/2020   Walked RA  approx   400  ft  @ very fast pace  stopped due to sob with sats still 95%     Clinically has completely recovered x for a nagging cough that is not typical of ILD but more c/w  Upper airway cough syndrome (previously labeled PNDS),  is so named because it's frequently impossible to sort out how much is  CR/sinusitis with freq throat clearing (which can be related to primary GERD)   vs  causing  secondary (" extra esophageal")  GERD from wide swings in gastric pressure that occur with throat clearing, often  promoting self use of mint and menthol lozenges that reduce the lower esophageal sphincter tone and exacerbate the problem further in a cyclical fashion.   These are the same pts (now being labeled as having "irritable larynx syndrome" by some cough centers) who not infrequently have a history of having failed to tolerate ace inhibitors,  dry powder inhalers or biphosphonates or report having atypical/extraesophageal reflux symptoms that don't respond to standard doses of PPI  and are easily confused as having aecopd or asthma flares by even experienced allergists/ pulmonologists (myself included).   rec max rx for gerd/ complete w/u for post covid cough/fatigue/ sob and regroup in 2 weeks     Acute pulmonary embolus ([place]) See CTa  09/16/19 with RV ok on Echo ok and RA pressure 3  09/16/19 and 11/27/19  - venous dopplers 11/27/19 nl    Main risk factor is moderate obesity , clearly provoked by Covid, so rec complete 6 m rx in Feb 10/21 and no need for longer term rx  Discussed in detail all the  indications, usual  risks and alternatives  relative to the benefits with patient who agrees to proceed with Rx as outlined.             Each maintenance medication was reviewed in detail including emphasizing most importantly the difference between maintenance and prns and under what circumstances the prns are to be triggered using an action plan format where appropriate.  Total time for H and P, chart review, counseling,  directly observing portions of ambulatory 02 saturation study/  and  generating customized AVS unique to this office visit / charting = 62 min           Christinia Gully, MD 01/08/2020

## 2020-01-08 NOTE — Patient Instructions (Addendum)
We will walk you today as fast as you can    Pantoprazole (protonix) 40 mg   Take  30-60 min before first meal of the day and Pepcid (famotidine)  20 mg one after supper until return to office - this is the best way to tell whether stomach acid is contributing to your problem.    Make sure you check your oxygen saturations at highest level of activity to be sure it stays over 90% and keep track of it at least once a week, more often if breathing getting worse, and let me know if losing ground.   GERD (REFLUX)  is an extremely common cause of respiratory symptoms just like yours , many times with no obvious heartburn at all.    It can be treated with medication, but also with lifestyle changes including elevation of the head of your bed (ideally with 6-8inch blocks under the headboard of your bed),  Smoking cessation, avoidance of late meals, excessive alcohol, and avoid fatty foods, chocolate, peppermint, colas, red wine, and acidic juices such as orange juice.  NO MINT OR MENTHOL PRODUCTS SO NO COUGH DROPS  USE SUGARLESS CANDY INSTEAD (Jolley ranchers or Stover's or Life Savers) or even ice chips will also do - the key is to swallow to prevent all throat clearing. NO OIL BASED VITAMINS - use powdered substitutes.  Avoid fish oil when coughing.    Only use your albuterol as a rescue medication to be used if you can't catch your breath by resting or doing a relaxed purse lip breathing pattern.  - The less you use it, the better it will work when you need it. - Ok to use up to 2 puffs  every 4 hours if you must but call for immediate appointment if use goes up over your usual need - Don't leave home without it !!  (think of it like the spare tire for your car)      Please remember to go to the lab and x-ray department at [facility]   for your tests - we will call you with the results when they are available.       Please schedule a follow up office visit in 2 weeks, sooner if needed   Add : did not go to lab/xray, encouraged to do so to complete the w/u

## 2020-01-14 ENCOUNTER — Telehealth: Payer: Self-pay | Admitting: *Deleted

## 2020-01-14 NOTE — Telephone Encounter (Signed)
-----   Message from Tanda Rockers, MD sent at 01/08/2020  6:34 PM EST ----- Did not go to lab or cxr, need to do so to complete the initial w/u

## 2020-01-14 NOTE — Telephone Encounter (Signed)
Tried calling the pt and there was no answer and his VM is not set up  Will try back later

## 2020-01-21 NOTE — Telephone Encounter (Signed)
Spoke with the pt  He states he forgot all about labs and cxr and will go when he returns to town next wk

## 2020-01-22 ENCOUNTER — Ambulatory Visit: Payer: No Typology Code available for payment source | Admitting: Internal Medicine

## 2020-02-03 ENCOUNTER — Ambulatory Visit: Payer: No Typology Code available for payment source | Admitting: Internal Medicine

## 2020-02-03 NOTE — Progress Notes (Deleted)
Jose Lamb, male    DOB: [birth year]    MRN: [id number]   Brief patient profile:  75 yobm never smoker grew up in [place] as [place] able to do sports but by late 20s began to need to start saba prn with season change then admit with covid :  Admit date: 09/15/2019 Discharge date: 09/19/2019 Recommendations for Outpatient Follow-up:  1. Follow up with PCP in 1-2 weeks 2. Please obtain BMP/CBC in one week 3. Please follow up on the following pending results:  Discharge Condition: Stable CODE STATUS: FULL Diet recommendation: Heart Healthy   Brief/Interim Summary: As per H&P written by Dr. Denton Lamb on /89/2021 Jose Lamb a20 y.o.malenon-smoker with past medical history relevant for HTN, PUD, glucose intolerance and h/oPAFib whopresents with a 1 week history of family members being sick with COVID-19 symptoms and patient previously tested positive for COVID-19 infection  -Over the last 24 hours prior to admission having increasing shortness of breath, in the ED is found to be hypoxic requiring greater than 3 L of oxygen via nasal cannula - WBC 5.4,LFTs WNL, PCT less than 0.10,ferritin 318 -D-dimer greater than 20 -LDH 239,fibrinogen 689,CRP 11.9,chest x-ray consistent with Covid pneumonia  -Currently no vomiting no diarrhea no chest pains palpitations or pleuritic symptoms  -Please note that the patient did receive his [employer] vaccine in April 2021 Covid pneumonia with hypoxia greater than 3 L of oxygen via nasal cannula,IV steroids, supplemental oxygen and IV remdesivir. He did not want to be discharged early for outpt remdesivir despite his medical stability.  He completed 5 days remdesivir in hospital.  He was weaned to RA.  He will go home with 5 more days of steroids.  Discharge Diagnoses:  acute respiratory failure with hypoxia -due to COVID-19 pneumonia and bilateral pulmonary embolism -now on 3L [place]>>>RA -Continue IV  steroids and IV remdesivir -Continue to wean oxygen supplementation as tolerated -Continue to followinflammatory markders -Given elevated D-dimerCTA chestwas done demonstrating bilateral pulmonary embolism with mild right heart strain;  -continue IV heparin>>apixaban -Continue as needed bronchodilators, antitussive, flutter valve and incentive spirometer. -Continue increasing activity as tolerated and following proning position most times of the day. -Continue vitamin C and zinc. -ambulatory pulse ox did not show any desaturation <88%  Acute PE -09/16/19 CTA chest--bilateral PE and patchy GGO -09/16/19 Echo--EF 60-65%, no WMA; normal RV systolic function -continue IV heparin>>>apixaban  Diabetes Mellitus Type 2 -With ongoing hyperglycemia secondary to steroids usage -Follow A1c--6.8 -Continue sliding scale insulin and Levemir  paroxysmal atrial fibrillation -Currently sinus rhythm appreciated on telemetry -optimize electrolytes -Patient will require anticoagulation for acutePEat least for the next 6 months. -09/16/19 Echo as above -Patient was not using any rate control agent prior to admission; as needed beta-blocker has been ordered. -Continue to monitor on telemetry.  class II morbid obesity -Body mass index is 35.98 kg/m. -Low calorie diet, portion control and increase physical activity discussed with patient.  gastroesophageal reflux disease/GI prophylaxis -Continue PPI.     History of Present Illness  01/08/2020  Pulmonary/ 1st office eval/ Jose Lamb / [facility] post covid ov  Chief Complaint  Patient presents with  . Pulmonary Consult    Referred by Jose Lamb for bilateral PE.  Pt dx with Covid 19 09/15/19 and was hospitalized. He has DOE and swelling in his feet.   Dyspnea:  200 ft stops p gets tired does not check sats / able to return to work since Dec 08 2019 Cough: better but coughs  esp with cold or hot air  Sleep: at 60 degrees o/w cough -  new since covid SABA use: helps breathing not the cough  rec We will walk you today as fast as you can  Pantoprazole (protonix) 40 mg   Take  30-60 min before first meal of the day and Pepcid (famotidine)  20 mg one after supper until return to office  Make sure you check your oxygen saturations at highest level of activity  GERD diet  Only use your albuterol as a rescue medication  Please remember to go to the lab and x-ray department at [facility]   for your tests - we will call you with the results when they are available. Add : did not go to lab/xray, encouraged to do so to complete the w/u     02/03/2020  f/u ov/[place] office/Jose Lamb re:  No chief complaint on file.    Dyspnea:  *** Cough: *** Sleeping: *** SABA use: *** 02: ***   No obvious day to day or daytime variability or assoc excess/ purulent sputum or mucus plugs or hemoptysis or cp or chest tightness, subjective wheeze or overt sinus or hb symptoms.   *** without nocturnal  or early am exacerbation  of respiratory  c/o's or need for noct saba. Also denies any obvious fluctuation of symptoms with weather or environmental changes or other aggravating or alleviating factors except as outlined above   No unusual exposure hx or h/o childhood pna/ asthma or knowledge of premature birth.  Current Allergies, Complete Past Medical History, Past Surgical History, Family History, and Social History were reviewed in [employer] record.  ROS  The following are not active complaints unless bolded Hoarseness, sore throat, dysphagia, dental problems, itching, sneezing,  nasal congestion or discharge of excess mucus or purulent secretions, ear ache,   fever, chills, sweats, unintended wt loss or wt gain, classically pleuritic or exertional cp,  orthopnea pnd or arm/hand swelling  or leg swelling, presyncope, palpitations, abdominal pain, anorexia, nausea, vomiting, diarrhea  or change in bowel habits or  change in bladder habits, change in stools or change in urine, dysuria, hematuria,  rash, arthralgias, visual complaints, headache, numbness, weakness or ataxia or problems with walking or coordination,  change in mood or  memory.        No outpatient medications have been marked as taking for the 02/03/20 encounter (Appointment) with Nyoka Cowden, MD.              Past Medical History:  Diagnosis Date  . Atrial fibrillation ([facility])    In the setting of hyperthyroidism  . Bilateral pulmonary embolism [facility])    Diagnosed August 2021  . Essential hypertension   . HIV antibody positive ([facility])   . Perforated duodenal ulcer ([facility]) 2015   Contained without need for surgical intervention  . Pneumonia due to COVID-19 virus    Diagnosed August 2021      Objective:       Wt Readings from Last 3 Encounters:  01/08/20 265 lb (120.2 kg)  10/29/19 268 lb (121.6 kg)  10/03/19 279 lb (126.6 kg)     Vital signs reviewed - Note on arrival 02/03/2020  02 sats  ***% on ***               CXR PA and Lateral:   01/08/2020 :    I personally reviewed images and agree with radiology impression as follows:    did not go  to xray  Did not go to lab     Assessment

## 2020-04-02 ENCOUNTER — Other Ambulatory Visit: Payer: Self-pay | Admitting: Internal Medicine

## 2021-01-30 ENCOUNTER — Other Ambulatory Visit: Payer: Self-pay

## 2021-01-30 ENCOUNTER — Emergency Department (HOSPITAL_COMMUNITY)
Admission: EM | Admit: 2021-01-30 | Discharge: 2021-01-30 | Disposition: A | Discharge status: Home / Self Care | Payer: No Typology Code available for payment source | Attending: Emergency Medicine | Admitting: Emergency Medicine

## 2021-01-30 ENCOUNTER — Emergency Department (HOSPITAL_COMMUNITY): Payer: No Typology Code available for payment source

## 2021-01-30 ENCOUNTER — Encounter (HOSPITAL_COMMUNITY): Payer: Self-pay

## 2021-01-30 DIAGNOSIS — I10 Essential (primary) hypertension: Secondary | ICD-10-CM | POA: Insufficient documentation

## 2021-01-30 DIAGNOSIS — M25561 Pain in right knee: Secondary | ICD-10-CM | POA: Insufficient documentation

## 2021-01-30 DIAGNOSIS — M545 Low back pain, unspecified: Secondary | ICD-10-CM | POA: Insufficient documentation

## 2021-01-30 DIAGNOSIS — J45909 Unspecified asthma, uncomplicated: Secondary | ICD-10-CM | POA: Diagnosis not present

## 2021-01-30 DIAGNOSIS — Z21 Asymptomatic human immunodeficiency virus [HIV] infection status: Secondary | ICD-10-CM | POA: Diagnosis not present

## 2021-01-30 DIAGNOSIS — M542 Cervicalgia: Secondary | ICD-10-CM | POA: Insufficient documentation

## 2021-01-30 DIAGNOSIS — M7918 Myalgia, other site: Secondary | ICD-10-CM

## 2021-01-30 DIAGNOSIS — R519 Headache, unspecified: Secondary | ICD-10-CM | POA: Diagnosis not present

## 2021-01-30 DIAGNOSIS — Z7901 Long term (current) use of anticoagulants: Secondary | ICD-10-CM | POA: Diagnosis not present

## 2021-01-30 DIAGNOSIS — Y92410 Unspecified street and highway as the place of occurrence of the external cause: Secondary | ICD-10-CM | POA: Diagnosis not present

## 2021-01-30 DIAGNOSIS — Z8616 Personal history of COVID-19: Secondary | ICD-10-CM | POA: Insufficient documentation

## 2021-01-30 MED ORDER — METHOCARBAMOL 500 MG PO TABS
500.0000 mg | ORAL_TABLET | Freq: Once | ORAL | Status: AC
  Administered 2021-01-30: 20:00:00 500 mg via ORAL
  Filled 2021-01-30: qty 1

## 2021-01-30 MED ORDER — METHOCARBAMOL 500 MG PO TABS: 500.0000 mg | ORAL_TABLET | Freq: Two times a day (BID) | ORAL | 0 refills | Status: AC
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
## 2021-02-27 ENCOUNTER — Encounter (HOSPITAL_COMMUNITY): Payer: Self-pay

## 2021-02-27 ENCOUNTER — Emergency Department (HOSPITAL_COMMUNITY)
Admission: EM | Admit: 2021-02-27 | Discharge: 2021-02-27 | Disposition: A | Discharge status: Home / Self Care | Payer: 59 | Attending: Emergency Medicine | Admitting: Emergency Medicine

## 2021-02-27 DIAGNOSIS — R21 Rash and other nonspecific skin eruption: Secondary | ICD-10-CM | POA: Insufficient documentation

## 2021-02-27 DIAGNOSIS — J45909 Unspecified asthma, uncomplicated: Secondary | ICD-10-CM | POA: Insufficient documentation

## 2021-02-27 DIAGNOSIS — E119 Type 2 diabetes mellitus without complications: Secondary | ICD-10-CM | POA: Insufficient documentation

## 2021-02-27 DIAGNOSIS — I480 Paroxysmal atrial fibrillation: Secondary | ICD-10-CM | POA: Insufficient documentation

## 2021-02-27 DIAGNOSIS — Z7901 Long term (current) use of anticoagulants: Secondary | ICD-10-CM | POA: Diagnosis not present

## 2021-02-27 MED ORDER — TRIAMCINOLONE ACETONIDE 0.1 % EX CREA: 1.0000 "application " | TOPICAL_CREAM | Freq: Two times a day (BID) | CUTANEOUS | 0 refills | Status: DC

## 2021-02-27 NOTE — ED Triage Notes (Signed)
Right hand and right elbow rash reported, redness to right elbow.

## 2021-02-27 NOTE — ED Provider Notes (Signed)
[facility] Provider Note   CSN: [id number] Arrival date & time: 02/27/21  0973     History  No chief complaint on file.   Jose Lamb is a 61 y.o. male.  HPI Patient presents for rash.  Medical history is notable for previous PE, asthma, DM2, paroxysmal atrial fibrillation.  Yesterday, he noticed a area of blistering on the proximal aspect of his right palm.  Area was not painful or pruritic.  Since then, he had other areas of blistering in the area of his right elbow.  He has not had any other areas of noticeable rash.  He has not had any systemic symptoms.  At work, patient moves and unloads boxes of clothing.  He reports that he does not wear gloves at times.  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  He feels that he may have had an exposure at work causing his rash.  Family members at home have not had similar rash.    Home Medications Prior to Admission medications   Medication Sig Start Date End Date Taking? Authorizing Provider  triamcinolone cream (KENALOG) 0.1 % Apply 1 application topically 2 (two) times daily. 02/27/21  Yes Godfrey Pick, MD  albuterol (VENTOLIN HFA) 108 (90 Base) MCG/ACT inhaler SMARTSIG:1-2 Puff(s) By Mouth Every 4 Hours PRN 10/23/19   [provider]  apixaban (ELIQUIS) 5 MG TABS tablet Take 2 tablets (10 mg total) by mouth 2 (two) times daily. Through 09/24/19.  Starting 09/25/19 evening take 1 tablet (5mg ) two times daily 09/19/19   Orson Eva, MD  famotidine (PEPCID) 20 MG tablet One after supper 01/08/20   Tanda Rockers, MD  furosemide (LASIX) 40 MG tablet Take 40 mg by mouth 2 (two) times daily as needed. 10/07/19   [provider]  LANTUS SOLOSTAR 100 UNIT/ML Solostar Pen PLEASE SEE ATTACHED FOR DETAILED DIRECTIONS 10/20/19   [provider]  pantoprazole (PROTONIX) 40 MG tablet Take 1 tablet (40 mg total) by mouth daily. Take 30-60 min before first meal of the  day 01/08/20   Tanda Rockers, MD  Potassium Chloride ER 20 MEQ TBCR Take 1 tablet by mouth 2 (two) times daily. 10/20/19   [provider]      Allergies    Asa [aspirin]    Review of Systems   Review of Systems  Constitutional:  Negative for activity change, appetite change, chills, fatigue and fever.  HENT:  Negative for ear pain and sore throat.   Eyes:  Negative for pain and visual disturbance.  Respiratory:  Negative for cough and shortness of breath.   Cardiovascular:  Negative for chest pain and palpitations.  Gastrointestinal:  Negative for abdominal pain, diarrhea, nausea and vomiting.  Genitourinary:  Negative for dysuria and hematuria.  Musculoskeletal:  Negative for arthralgias, back pain, gait problem, joint swelling, myalgias and neck pain.  Skin:  Positive for rash. Negative for color change.  Allergic/Immunologic: Negative for immunocompromised state.  Neurological:  Negative for seizures, syncope, weakness, light-headedness and headaches.  All other systems reviewed and are negative.  Physical Exam Updated Vital Signs BP 138/80 (BP Location: Left Arm)    Pulse 87    Temp 98 F (36.7 C) (Oral)    Resp 16    Ht 6' (1.829 m)    Wt 117 kg    SpO2 97%    BMI 34.98 kg/m  Physical Exam Vitals and nursing note reviewed.  Constitutional:  General: He is not in acute distress.    Appearance: Normal appearance. He is well-developed and normal weight. He is not ill-appearing, toxic-appearing or diaphoretic.  HENT:     Head: Normocephalic and atraumatic.     Right Ear: External ear normal.     Left Ear: External ear normal.     Nose: Nose normal.     Mouth/Throat:     Mouth: Mucous membranes are moist.     Pharynx: Oropharynx is clear.  Eyes:     General: No scleral icterus.    Extraocular Movements: Extraocular movements intact.     Conjunctiva/sclera: Conjunctivae normal.  Cardiovascular:     Rate and Rhythm: Normal rate and regular rhythm.     Heart  sounds: No murmur heard. Pulmonary:     Effort: Pulmonary effort is normal. No respiratory distress.  Abdominal:     Palpations: Abdomen is soft.     Tenderness: There is no abdominal tenderness.  Musculoskeletal:        General: No swelling. Normal range of motion.     Cervical back: Normal range of motion and neck supple. No rigidity.     Right lower leg: No edema.     Left lower leg: No edema.  Skin:    General: Skin is warm and dry.     Capillary Refill: Capillary refill takes less than 2 seconds.     Coloration: Skin is not jaundiced or pale.     Findings: Rash present.  Neurological:     General: No focal deficit present.     Mental Status: He is alert and oriented to person, place, and time.     Cranial Nerves: No cranial nerve deficit.     Sensory: No sensory deficit.     Motor: No weakness.     Coordination: Coordination normal.  Psychiatric:        Mood and Affect: Mood normal.        Behavior: Behavior normal.       ED Results / Procedures / Treatments   Labs (all labs ordered are listed, but only abnormal results are displayed) Labs Reviewed - No data to display  EKG None  Radiology No results found.  Procedures Procedures    Medications Ordered in ED Medications - No data to display  ED Course/ Medical Decision Making/ A&P                           Medical Decision Making  This patient presents to the ED for concern of rash, this involves an extensive number of treatment options, and is a complaint that carries with it a high risk of complications and morbidity.  The differential diagnosis includes HSV, VZV, HFM, scabies, contact dermatitis, folliculitis   Co morbidities that complicate the patient evaluation   previous PE, asthma, DM2, paroxysmal atrial fibrillation   Additional history obtained:  Additional history obtained from N/A External records from outside source obtained and reviewed including EMR   Lab Tests:  I Ordered, and  personally interpreted labs.  The pertinent results include:  N/A   Imaging Studies ordered:  I ordered imaging studies including N/A  I independently visualized and interpreted imaging which showed N/A   Medicines ordered and prescription drug management:  I ordered medication including triamcinolone prescription for treatment of rash I have reviewed the patients home medicines and have made adjustments as needed  Problem List / ED Course:  Patient with nonpainful,  nonpruritic blistering areas to right palm and right elbow.  He has not had systemic symptoms.  Areas of rash are not tender on exam.  Patient is well-appearing.  Appears the rash is consistent with possible contact dermatitis and/or folliculitis.  Patient was advised to exercise good hygiene and triamcinolone cream was prescribed.  Patient to follow-up with primary care doctor for reassessment.  He was also encouraged to return to the ED if he develops worsening spread or systemic symptoms.  He is stable for discharge at this time.   Reevaluation:  After the interventions noted above, I reevaluated the patient and found that they have :stayed the same   Social Determinants of Health:  Patient has a primary care doctor and access to outpatient care.   Dispostion:  After consideration of the diagnostic results and the patients response to treatment, I feel that the patent would benefit from discharge with PCP follow-up.            Final Clinical Impression(s) / ED Diagnoses Final diagnoses:  Rash    Rx / DC Orders ED Discharge Orders          Ordered    triamcinolone cream (KENALOG) 0.1 %  2 times daily        02/27/21 0756              Godfrey Pick, MD 02/27/21 [phone]

## 2021-04-05 ENCOUNTER — Other Ambulatory Visit: Payer: Self-pay

## 2021-04-05 ENCOUNTER — Encounter (HOSPITAL_COMMUNITY): Payer: Self-pay | Admitting: Physical Therapy

## 2021-04-05 ENCOUNTER — Ambulatory Visit (HOSPITAL_COMMUNITY): Payer: 59 | Attending: Family Medicine | Admitting: Physical Therapy

## 2021-04-05 DIAGNOSIS — M25661 Stiffness of right knee, not elsewhere classified: Secondary | ICD-10-CM | POA: Insufficient documentation

## 2021-04-05 DIAGNOSIS — M25561 Pain in right knee: Secondary | ICD-10-CM | POA: Insufficient documentation
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
## 2021-04-07 ENCOUNTER — Ambulatory Visit (HOSPITAL_COMMUNITY): Payer: 59 | Attending: Family Medicine

## 2021-04-07 ENCOUNTER — Other Ambulatory Visit: Payer: Self-pay

## 2021-04-07 ENCOUNTER — Encounter (HOSPITAL_COMMUNITY): Payer: Self-pay

## 2021-04-07 DIAGNOSIS — M25661 Stiffness of right knee, not elsewhere classified: Secondary | ICD-10-CM

## 2021-04-07 DIAGNOSIS — M25561 Pain in right knee: Secondary | ICD-10-CM

## 2021-04-07 NOTE — Therapy (Signed)
[place] [facility] ?[address] ?[place], [place], [address] ?Phone: [phone]   Fax:  [phone] ? ?Physical Therapy Treatment ? ?Patient Details  ?Name: Jose Lamb ?MRN: [id number] ?Date of Birth: 1960/12/09 ?Referring Provider (PT): Delman Cheadle ? ? ?Encounter Date: 04/07/2021 ? ? PT End of Session - 04/07/21 1629   ? ? Visit Number 2   ? Number of Visits 12   ? Date for PT Re-Evaluation 05/17/21   ? Authorization Type Cigna   ? Authorization - Visit Number 2   ? Authorization - Number of Visits 60   ? Progress Note Due on Visit 10   ? PT Start Time [id number]   ? PT Stop Time 2671   ? PT Time Calculation (min) 41 min   ? Activity Tolerance Patient tolerated treatment well;Patient limited by pain   ? Behavior During Therapy [facility] for tasks assessed/performed   ? ?  ?  ? ?  ? ? ?Past Medical History:  ?Diagnosis Date  ? Atrial fibrillation ([place])   ? In the setting of hyperthyroidism  ? Bilateral pulmonary embolism ([facility])   ? Diagnosed August 2021  ? Essential hypertension   ? 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 Perforated duodenal ulcer ([place]) 2015  ? Contained without need for surgical intervention  ? Pneumonia due to COVID-19 virus   ? Diagnosed August 2021  ? ? ?Past Surgical History:  ?Procedure Laterality Date  ? COLONOSCOPY  Sept 2015  ? [place]: transverse colon polyp with pathology reporting lymphoid nodule   ? ESOPHAGOGASTRODUODENOSCOPY N/A 03/30/2014  ? Procedure: ESOPHAGOGASTRODUODENOSCOPY (EGD);  Surgeon: Daneil Dolin, MD;  Location[facility];  Service: Endoscopy;  Laterality: N/A;  215  ? ? ?There were no vitals filed for this visit. ? ? Subjective Assessment - 04/07/21 1621   ? ? Subjective Pt stated he continues to have Rt knee pain, scale 9/10 constant sore, with sharp pain when twsting.  Reports he has began the seated HEP exercises, admits to not completeing the exercises on mat.  Reports he has difficulty with knee locking when knee cap shifts.   ?  Currently in Pain? Yes   ? Pain Score 9    ? Pain Location Knee   ? Pain Orientation Right   ? Pain Descriptors / Indicators Aching;Sore;Sharp;Throbbing   ? Pain Type Acute pain   ? Pain Onset More than a month ago   ? Pain Frequency Constant   ? Aggravating Factors  twisting   ? Pain Relieving Factors rest   ? Effect of Pain on Daily Activities limits   ? ?  ?  ? ?  ? ? ? ? ? ? ? ? ? ? ? ? ? ? ? ? ? ? ? ? [facility] PT Treatment/Exercise - 04/07/21 0001   ? ?  ? Knee/Hip Exercises: Stretches  ? Knee: Self-Stretch to increase Flexion 5 reps;10 seconds   ? Knee: Self-Stretch Limitations Knee drive on 24PY step 5x 10"   ?  ? Knee/Hip Exercises: Aerobic  ? Stationary Bike 5' seat 16   ?  ? Knee/Hip Exercises: Standing  ? Heel Raises Both;10 reps   ? Knee Flexion Right;10 reps   ? Rocker Board 1 minute   ? Rocker Board Limitations lateral and DF/PF   ? Gait Training Gait training heel to toe mechanics 248ft   ?  ? Knee/Hip Exercises: Supine  ? Bridges 10 reps   ? Bridges with  Ball Squeeze 10 reps   ? Straight Leg Raises Right;10 reps   ? Straight Leg Raises Limitations tendency to ER   ? Patellar Mobs all directions, more pain medial aspect   ? Knee Flexion AROM   ? Knee Flexion Limitations 108   ? ?  ?  ? ?  ? ? ? ? ? ? ? ? ? ? PT Education - 04/07/21 1628   ? ? Education Details Reviewed goals, educated importance of HEP compliance for maximal benefits, pt able to recall and stated he will try to complete more at home.   ? Person(s) Educated Patient   ? Methods Explanation   ? Comprehension Verbalized understanding   ? ?  ?  ? ?  ? ? ? PT Short Term Goals - 04/05/21 1659   ? ?  ? PT SHORT TERM GOAL #1  ? Title PT to be I with HEP to allow pain to be no greater than a 6/10   ? Time 3   ? Period Weeks   ? Status New   ? Target Date 04/26/21   ?  ? PT SHORT TERM GOAL #2  ? Title Pt Rt knee flexion to be at 120   ? Time 3   ? Period Weeks   ? Status New   ?  ? PT SHORT TERM GOAL #3  ? Title PT RT LE strength to be  improved one grade to allow pt to ambulate without significant limp   ? Time 3   ? Period Weeks   ? Status New   ? ?  ?  ? ?  ? ? ? ? PT Long Term Goals - 04/05/21 1700   ? ?  ? PT LONG TERM GOAL #1  ? Title Pt to be I in advanced HEP to allow pt pain to be no greater than a 2/10   ? Time 6   ? Period Weeks   ? Status New   ? Target Date 05/17/21   ?  ? PT LONG TERM GOAL #2  ? Title PT Rt knee ROM to be to 130 to allow pt to squat without difficulty to complete yard work   ? Time 6   ? Period Weeks   ? Status New   ?  ? PT LONG TERM GOAL #3  ? Title Pt Rt LE strength to be at least a 4+/5 to be able to go up and down 12 steps in a reciprocal manner without holding onto handrail   ? Time 6   ? Period Weeks   ? Status New   ?  ? PT LONG TERM GOAL #4  ? Title PT to be able to single leg stance for 30" B to reduce risk of falling   ? Time 6   ? Period Weeks   ? Status New   ? ?  ?  ? ?  ? ? ? ? ? ? ? ? Plan - 04/07/21 1631   ? ? Clinical Impression Statement Reviewed goals, educated importance of HEP complaince for maximal benefits, verbalized understanding.  Session focus wiht knee mobility and gait training to reduce limping for pain control.  Pt limited by pain especially medial aspect of patella, monitored through session.  Good tolerance with VMO strengthening exercises.  EOS on bike for mobilty.  Pt reports pain reduced to 6/10 (was 9/10 initial session.)  Reviewed RICE techniques for pain and edema control.   ? Personal Factors and  Comorbidities Behavior Pattern   ? Examination-Activity Limitations Squat;Stairs;Stand;Lift;Locomotion Level   ? Examination-Participation Restrictions Cleaning;Driving;Yard Work;Occupation   ? Stability/Clinical Decision Making Stable/Uncomplicated   ? Clinical Decision Making Low   ? Rehab Potential Good   ? PT Frequency 2x / week   ? PT Duration 6 weeks   ? PT Treatment/Interventions Patient/family education;Therapeutic exercise   ? PT Next Visit Plan Begin weight bearing exercises  continue to stress flexion until full ROM is achieved.   ? PT Home Exercise Plan LAQ, heel slides SLR, hip abduction, extension, prone knee flexion   ? Consulted and Agree with Plan of Care Patient   ? ?  ?  ? ?  ? ? ?Patient will benefit from skilled therapeutic intervention in order to improve the following deficits and impairments:  Abnormal gait, Pain, Decreased strength, Decreased range of motion, Decreased balance ? ?Visit Diagnosis: ?Acute pain of right knee ? ?Stiffness of right knee, not elsewhere classified ? ? ? ? ?Problem List ?Patient Active Problem List  ? Diagnosis Date Noted  ? Acute pulmonary embolus ([place]) 09/19/2019  ? Acute respiratory failure with hypoxia ([place])   ? Acute respiratory disease due to COVID-19 virus 09/15/2019  ? Pneumonia due to COVID-19 virus 09/15/2019  ? Ectatic aorta ([place]) 04/17/2018  ? Pre-diabetes 11/26/2014  ? Asthma 11/05/2014  ? Paroxysmal atrial fibrillation ([place]) 06/30/2014  ? Graves' disease 04/01/2014  ? Constipation 03/24/2014  ? Hypertension 02/06/2014  ? Moderate malnutrition ([place]) 02/06/2014  ? Peptic ulcer with perforation ([place])   ? Duodenal ulcer perforation ([place]) 02/04/2014  ? HIV (human immunodeficiency virus infection) ([place]) 02/04/2014  ? Perforated duodenal ulcer ([place]) 02/04/2014  ? Neuropathic pain 03/13/2013  ? Obesity 07/04/2012  ? 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 ?Ihor Austin, LPTA/CLT; CBIS ?[phone] ? ?Aldona Lento, PTA ?04/07/2021, 5:01 PM ? ?[facility] ?[address] ?[place], [place], [address] ?Phone: [phone]   Fax:  [phone] ? ?Name: Umar Patmon ?MRN: [id number] ?Date of Birth: 05/11/1960 ? ? ? ?

## 2021-04-12 ENCOUNTER — Other Ambulatory Visit: Payer: Self-pay

## 2021-04-12 ENCOUNTER — Ambulatory Visit (HOSPITAL_COMMUNITY): Payer: 59

## 2021-04-12 DIAGNOSIS — M25561 Pain in right knee: Secondary | ICD-10-CM | POA: Diagnosis not present

## 2021-04-12 DIAGNOSIS — M25661 Stiffness of right knee, not elsewhere classified: Secondary | ICD-10-CM

## 2021-04-12 NOTE — Patient Instructions (Signed)
Hamstring stretch with strap 3 x 20 sec ?SLS 3 x 10 sec each ?

## 2021-04-12 NOTE — Therapy (Signed)
[facility] ?[facility] ?[address] ?[place], [place], [address] ?Phone: [phone]   Fax:  [phone] ? ?Physical Therapy Treatment ? ?Patient Details  ?Name: Jose Lamb ?MRN: [id number] ?Date of Birth: [birth year] ?Referring Provider (PT): Delman Cheadle ? ? ?Encounter Date: 04/12/2021 ? ? PT End of Session - 04/12/21 1648   ? ? Visit Number 3   ? Number of Visits 12   ? Date for PT Re-Evaluation 05/17/21   ? Authorization Type Cigna   ? Authorization - Visit Number 3   ? Authorization - Number of Visits 60   ? Progress Note Due on Visit 10   ? PT Start Time 5102   ? PT Stop Time 1730   ? PT Time Calculation (min) 41 min   ? Activity Tolerance Patient tolerated treatment well;Patient limited by pain   ? Behavior During Therapy [facility] for tasks assessed/performed   ? ?  ?  ? ?  ? ? ?Past Medical History:  ?Diagnosis Date  ? Atrial fibrillation ([place])   ? In the setting of hyperthyroidism  ? Bilateral pulmonary embolism ([facility])   ? Diagnosed August 2021  ? Essential hypertension   ? 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 Perforated duodenal ulcer ([place]) 2015  ? Contained without need for surgical intervention  ? Pneumonia due to COVID-19 virus   ? Diagnosed August 2021  ? ? ?Past Surgical History:  ?Procedure Laterality Date  ? COLONOSCOPY  Sept 2015  ? [place]: transverse colon polyp with pathology reporting lymphoid nodule   ? ESOPHAGOGASTRODUODENOSCOPY N/A 03/30/2014  ? Procedure: ESOPHAGOGASTRODUODENOSCOPY (EGD);  Surgeon: Daneil Dolin, MD;  Location[facility];  Service: Endoscopy;  Laterality: N/A;  215  ? ? ?There were no vitals filed for this visit. ? ? Subjective Assessment - 04/12/21 1732   ? ? Subjective Patient 8/10 pain today; "I cannot straighten my knee".  Reports increased pain with turning/   ? Limitations Sitting;Standing;Walking   ? Pain Onset More than a month ago   ? ?  ?  ? ?  ? ? ? ? ? ? ? ? ? ? ? ? ? ? ? ? ? ? ? ? [facility] PT Treatment/Exercise - 04/12/21  0001   ? ?  ? Knee/Hip Exercises: Stretches  ? Active Hamstring Stretch Right;5 reps;20 seconds   strap  ? Gastroc Stretch Both;5 reps;20 seconds   slant board  ?  ? Knee/Hip Exercises: Aerobic  ? Stationary Bike 5' seat 16   ?  ? Knee/Hip Exercises: Standing  ? Heel Raises Both;10 reps;2 sets   ? Terminal Knee Extension Strengthening;Right;20 reps;Theraband   ? Theraband Level (Terminal Knee Extension) Level 3 ([place])   ? SLS SLS 3 x 10 sec   ? Other Standing Knee Exercises sidestepping 10 ft x 6 passes   ?  ? Knee/Hip Exercises: Supine  ? Quad Sets Right;10 reps   on soft foam roller  ? Heel Slides 10 reps   ? Bridges 10 reps   ? Bridges with [employer] 10 reps   ?  ? Knee/Hip Exercises: Sidelying  ? Hip ABduction Right;5 sets;AAROM   ? ?  ?  ? ?  ? ? ? ? ? ? ? ? ? ? PT Education - 04/12/21 1736   ? ? Education Details Added hamstring stretch with strap, SLS   ? Person(s) Educated Patient   ? Methods Explanation;Demonstration;Handout   ? Comprehension Verbalized understanding;Returned demonstration;Verbal cues  required   ? ?  ?  ? ?  ? ? ? PT Short Term Goals - 04/05/21 1659   ? ?  ? PT SHORT TERM GOAL #1  ? Title PT to be I with HEP to allow pain to be no greater than a 6/10   ? Time 3   ? Period Weeks   ? Status New   ? Target Date 04/26/21   ?  ? PT SHORT TERM GOAL #2  ? Title Pt Rt knee flexion to be at 120   ? Time 3   ? Period Weeks   ? Status New   ?  ? PT SHORT TERM GOAL #3  ? Title PT RT LE strength to be improved one grade to allow pt to ambulate without significant limp   ? Time 3   ? Period Weeks   ? Status New   ? ?  ?  ? ?  ? ? ? ? PT Long Term Goals - 04/05/21 1700   ? ?  ? PT LONG TERM GOAL #1  ? Title Pt to be I in advanced HEP to allow pt pain to be no greater than a 2/10   ? Time 6   ? Period Weeks   ? Status New   ? Target Date 05/17/21   ?  ? PT LONG TERM GOAL #2  ? Title PT Rt knee ROM to be to 130 to allow pt to squat without difficulty to complete yard work   ? Time 6   ? Period Weeks   ?  Status New   ?  ? PT LONG TERM GOAL #3  ? Title Pt Rt LE strength to be at least a 4+/5 to be able to go up and down 12 steps in a reciprocal manner without holding onto handrail   ? Time 6   ? Period Weeks   ? Status New   ?  ? PT LONG TERM GOAL #4  ? Title PT to be able to single leg stance for 30" B to reduce risk of falling   ? Time 6   ? Period Weeks   ? Status New   ? ?  ?  ? ?  ? ? ? ? ? ? ? ? Plan - 04/12/21 1737   ? ? Clinical Impression Statement this session focused on knee mobility and strength.  therapist added TKEs' hamstring stretch to work on knee extension; patient is able to perform but has difficulty straightening knee in supine.  Also added sidestepping and SLS to address balance.Patient will continue to benefit from skilled therapy services to reduce deficits and improve functional level.   ? Personal Factors and Comorbidities Behavior Pattern   ? Examination-Activity Limitations Squat;Stairs;Stand;Lift;Locomotion Level   ? Examination-Participation Restrictions Cleaning;Driving;Yard Work;Occupation   ? Stability/Clinical Decision Making Stable/Uncomplicated   ? Rehab Potential Good   ? PT Frequency 2x / week   ? PT Duration 6 weeks   ? PT Treatment/Interventions Patient/family education;Therapeutic exercise   ? PT Next Visit Plan Begin weight bearing exercises continue to stress flexion until full ROM is achieved.   ? PT Home Exercise Plan LAQ, heel slides SLR, hip abduction, extension, prone knee flexion   ? Consulted and Agree with Plan of Care Patient   ? ?  ?  ? ?  ? ? ?Patient will benefit from skilled therapeutic intervention in order to improve the following deficits and impairments:  Abnormal gait, Pain, Decreased strength,  Decreased range of motion, Decreased balance ? ?Visit Diagnosis: ?Acute pain of right knee ? ?Stiffness of right knee, not elsewhere classified ? ? ? ? ?Problem List ?Patient Active Problem List  ? Diagnosis Date Noted  ? Acute pulmonary embolus ([place]) 09/19/2019  ?  Acute respiratory failure with hypoxia ([place])   ? Acute respiratory disease due to COVID-19 virus 09/15/2019  ? Pneumonia due to COVID-19 virus 09/15/2019  ? Ectatic aorta ([place]) 04/17/2018  ? Pre-diabetes 11/26/2014  ? Asthma 11/05/2014  ? Paroxysmal atrial fibrillation ([place]) 06/30/2014  ? Graves' disease 04/01/2014  ? Constipation 03/24/2014  ? Hypertension 02/06/2014  ? Moderate malnutrition ([place]) 02/06/2014  ? Peptic ulcer with perforation ([place])   ? Duodenal ulcer perforation ([place]) 02/04/2014  ? HIV (human immunodeficiency virus infection) ([place]) 02/04/2014  ? Perforated duodenal ulcer ([place]) 02/04/2014  ? Neuropathic pain 03/13/2013  ? Obesity 07/04/2012  ? 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 ? ?5:42 PM, 04/12/21 ?Trevin Gartrell [facility] [place] ?[facility] ?[place] [id number] ?Ph:[phone] ? ? ?[facility] ?[place] ?[address] ?[place], [place], [address] ?Phone: [phone]   Fax:  [phone] ? ?Name: Jose Lamb ?MRN: [id number] ?Date of Birth: 1960/07/13 ? ? ? ?

## 2021-04-14 ENCOUNTER — Encounter (HOSPITAL_COMMUNITY): Payer: 59

## 2021-04-14 NOTE — Therapy (Deleted)
?OUTPATIENT PHYSICAL THERAPY TREATMENT NOTE ? ? ?Patient Name: Jose Lamb ?MRN: [id number] ?DOB:Mar 22, 1960, 61 y.o., male ?Today's Date: 04/14/2021 ? ?PCP: [name], [name] ?REFERRING PROVIDER: [name], PA* ? ? ? ?Past Medical History:  ?Diagnosis Date  ? Atrial fibrillation ([place])   ? In the setting of hyperthyroidism  ? Bilateral pulmonary embolism (HCC)   ? Diagnosed August 2021  ? Essential hypertension   ? 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 Perforated duodenal ulcer ([place]) 2015  ? Contained without need for surgical intervention  ? Pneumonia due to COVID-19 virus   ? Diagnosed August 2021  ? ?Past Surgical History:  ?Procedure Laterality Date  ? COLONOSCOPY  Sept 2015  ? [place]: transverse colon polyp with pathology reporting lymphoid nodule   ? ESOPHAGOGASTRODUODENOSCOPY N/A 03/30/2014  ? Procedure: ESOPHAGOGASTRODUODENOSCOPY (EGD);  Surgeon: Daneil Dolin, MD;  [facility];  Service: Endoscopy;  Laterality: N/A;  215  ? ?Patient Active Problem List  ? Diagnosis Date Noted  ? Acute pulmonary embolus ([place]) 09/19/2019  ? Acute respiratory failure with hypoxia ([place])   ? Acute respiratory disease due to COVID-19 virus 09/15/2019  ? Pneumonia due to COVID-19 virus 09/15/2019  ? Ectatic aorta ([place]) 04/17/2018  ? Pre-diabetes 11/26/2014  ? Asthma 11/05/2014  ? Paroxysmal atrial fibrillation ([place]) 06/30/2014  ? Graves' disease 04/01/2014  ? Constipation 03/24/2014  ? Hypertension 02/06/2014  ? Moderate malnutrition ([place]) 02/06/2014  ? Peptic ulcer with perforation ([place])   ? Duodenal ulcer perforation ([place]) 02/04/2014  ? HIV (human immunodeficiency virus infection) ([place]) 02/04/2014  ? Perforated duodenal ulcer ([place]) 02/04/2014  ? Neuropathic pain 03/13/2013  ? Obesity 07/04/2012  ? 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 ? ?REFERRING DIAG: *** ? ?THERAPY DIAG:  ?No diagnosis found. ? ?PERTINENT HISTORY: *** ? ?PRECAUTIONS: *** ? ?SUBJECTIVE: *** ? ?PAIN:  ?Are you having pain? {yes/no:20286} ?NPRS  scale: ***/10 ?Pain location: *** ?Pain orientation: {Pain Orientation:25161}  ?PAIN TYPE: {type:313116} ?Pain description: {PAIN DESCRIPTION:21022940}  ?Aggravating factors: *** ?Relieving factors: *** ? ? ? ?TODAY'S TREATMENT:  ?*** ? ? ?PATIENT EDUCATION: ?Education details: *** ?Person educated: {Person educated:25204} ?Education method: {Education Method:25205} ?Education comprehension: {Education Comprehension:25206} ? ? ?HOME EXERCISE PROGRAM: ?*** ? ? PT Short Term Goals - 04/05/21 1659   ? ?  ? PT SHORT TERM GOAL #1  ? Title PT to be I with HEP to allow pain to be no greater than a 6/10   ? Time 3   ? Period Weeks   ? Status New   ? Target Date 04/26/21   ?  ? PT SHORT TERM GOAL #2  ? Title Pt Rt knee flexion to be at 120   ? Time 3   ? Period Weeks   ? Status New   ?  ? PT SHORT TERM GOAL #3  ? Title PT RT LE strength to be improved one grade to allow pt to ambulate without significant limp   ? Time 3   ? Period Weeks   ? Status New   ? ?  ?  ? ?  ? ? ? PT Long Term Goals - 04/05/21 1700   ? ?  ? PT LONG TERM GOAL #1  ? Title Pt to be I in advanced HEP to allow pt pain to be no greater than a 2/10   ? Time 6   ? Period Weeks   ? Status New   ? Target Date 05/17/21   ?  ?  PT LONG TERM GOAL #2  ? Title PT Rt knee ROM to be to 130 to allow pt to squat without difficulty to complete yard work   ? Time 6   ? Period Weeks   ? Status New   ?  ? PT LONG TERM GOAL #3  ? Title Pt Rt LE strength to be at least a 4+/5 to be able to go up and down 12 steps in a reciprocal manner without holding onto handrail   ? Time 6   ? Period Weeks   ? Status New   ?  ? PT LONG TERM GOAL #4  ? Title PT to be able to single leg stance for 30" B to reduce risk of falling   ? Time 6   ? Period Weeks   ? Status New   ? ?  ?  ? ?  ? ?Plan - 04/14/21 1737   ?  ?  Clinical Impression Statement this session focused on knee mobility and strength.  therapist added TKEs' hamstring stretch to work on knee extension; patient is able to perform  but has difficulty straightening knee in supine.  Also added sidestepping and SLS to address balance.Patient will continue to benefit from skilled therapy services to reduce deficits and improve functional level.   ?  Personal Factors and Comorbidities Behavior Pattern   ?  Examination-Activity Limitations Squat;Stairs;Stand;Lift;Locomotion Level   ?  Examination-Participation Restrictions Cleaning;Driving;Yard Work;Occupation   ?  Stability/Clinical Decision Making Stable/Uncomplicated   ?  Rehab Potential Good   ?  PT Frequency 2x / week   ?  PT Duration 6 weeks   ?  PT Treatment/Interventions Patient/family education;Therapeutic exercise   ?  PT Next Visit Plan Begin weight bearing exercises continue to stress flexion until full ROM is achieved.   ?  PT Home Exercise Plan LAQ, heel slides SLR, hip abduction, extension, prone knee flexion   ? ? ? ? ? ?Ardis Rowan, PT ?04/14/2021, 2:41 PM ? ?  ? ?

## 2021-04-19 ENCOUNTER — Ambulatory Visit (HOSPITAL_COMMUNITY): Payer: 59

## 2021-04-19 ENCOUNTER — Other Ambulatory Visit: Payer: Self-pay

## 2021-04-19 DIAGNOSIS — M25561 Pain in right knee: Secondary | ICD-10-CM | POA: Diagnosis not present

## 2021-04-19 DIAGNOSIS — M25661 Stiffness of right knee, not elsewhere classified: Secondary | ICD-10-CM
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
## 2021-04-21 ENCOUNTER — Other Ambulatory Visit: Payer: Self-pay

## 2021-04-21 ENCOUNTER — Ambulatory Visit (HOSPITAL_COMMUNITY): Payer: 59

## 2021-04-21 DIAGNOSIS — M25561 Pain in right knee: Secondary | ICD-10-CM | POA: Diagnosis not present

## 2021-04-21 DIAGNOSIS — M25661 Stiffness of right knee, not elsewhere classified: Secondary | ICD-10-CM
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
## 2021-04-27 ENCOUNTER — Encounter (HOSPITAL_COMMUNITY): Payer: 59

## 2021-04-27 ENCOUNTER — Telehealth (HOSPITAL_COMMUNITY): Payer: Self-pay

## 2021-04-27 NOTE — Telephone Encounter (Signed)
No show, attempted to call with no answer and voice mail has not been set up yet. ? ?Ihor Austin, LPTA/CLT; CBIS ?[phone] ? ?

## 2021-04-28 ENCOUNTER — Ambulatory Visit (HOSPITAL_COMMUNITY): Payer: 59 | Admitting: Physical Therapy

## 2021-04-28 ENCOUNTER — Encounter (HOSPITAL_COMMUNITY): Payer: Self-pay | Admitting: Physical Therapy

## 2021-04-28 ENCOUNTER — Other Ambulatory Visit: Payer: Self-pay

## 2021-04-28 DIAGNOSIS — M25661 Stiffness of right knee, not elsewhere classified: Secondary | ICD-10-CM

## 2021-04-28 DIAGNOSIS — M25561 Pain in right knee: Secondary | ICD-10-CM | POA: Diagnosis not present
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
## 2021-05-04 ENCOUNTER — Encounter (HOSPITAL_COMMUNITY): Payer: 59 | Admitting: Physical Therapy

## 2021-05-05 ENCOUNTER — Ambulatory Visit (HOSPITAL_COMMUNITY): Payer: 59

## 2021-05-05 ENCOUNTER — Encounter (HOSPITAL_COMMUNITY): Payer: Self-pay

## 2021-05-05 DIAGNOSIS — M25561 Pain in right knee: Secondary | ICD-10-CM

## 2021-05-05 DIAGNOSIS — M25661 Stiffness of right knee, not elsewhere classified: Secondary | ICD-10-CM
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
## 2021-05-10 ENCOUNTER — Encounter (HOSPITAL_COMMUNITY): Payer: 59 | Admitting: Physical Therapy

## 2021-05-12 ENCOUNTER — Telehealth (HOSPITAL_COMMUNITY): Payer: Self-pay | Admitting: Physical Therapy

## 2021-05-12 ENCOUNTER — Encounter (HOSPITAL_COMMUNITY): Payer: 59 | Admitting: Physical Therapy

## 2021-05-12 NOTE — Telephone Encounter (Signed)
Pt did not show for appointment.  Called given contact number and recording indicated there is no voicemail set up to leave a message. ? ?Teena Irani, PTA/CLT, WTA ?[phone] ? ?

## 2021-05-17 ENCOUNTER — Ambulatory Visit (HOSPITAL_COMMUNITY): Payer: 59 | Attending: Family Medicine | Admitting: Physical Therapy

## 2021-05-17 ENCOUNTER — Encounter (HOSPITAL_COMMUNITY): Payer: Self-pay | Admitting: Physical Therapy

## 2021-05-17 DIAGNOSIS — M25661 Stiffness of right knee, not elsewhere classified: Secondary | ICD-10-CM | POA: Diagnosis present

## 2021-05-17 DIAGNOSIS — M25561 Pain in right knee: Secondary | ICD-10-CM | POA: Diagnosis present
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
## 2021-05-19 ENCOUNTER — Encounter (HOSPITAL_COMMUNITY): Payer: 59 | Admitting: Physical Therapy

## 2021-12-01 ENCOUNTER — Other Ambulatory Visit (HOSPITAL_COMMUNITY)
Admission: RE | Admit: 2021-12-01 | Discharge: 2021-12-01 | Disposition: A | Discharge status: Home / Self Care | Payer: 59 | Source: Ambulatory Visit | Attending: Family Medicine | Admitting: Family Medicine

## 2021-12-01 DIAGNOSIS — E1165 Type 2 diabetes mellitus with hyperglycemia: Secondary | ICD-10-CM | POA: Insufficient documentation

## 2021-12-01 LAB — COMPREHENSIVE METABOLIC PANEL
ALT: 25 U/L (ref 0–44)
AST: 28 U/L (ref 15–41)
Albumin: 3.5 g/dL (ref 3.5–5.0)
Alkaline Phosphatase: 72 U/L (ref 38–126)
Anion gap: 6 (ref 5–15)
BUN: 7 mg/dL (ref 6–20)
CO2: 27 mmol/L (ref 22–32)
Calcium: 8.3 mg/dL — ABNORMAL LOW (ref 8.9–10.3)
Chloride: 108 mmol/L (ref 98–111)
Creatinine, Ser: 0.78 mg/dL (ref 0.61–1.24)
GFR, Estimated: >60 mL/min (ref >60)
Glucose, Bld: 95 mg/dL (ref 70–99)
Potassium: 3.4 mmol/L — ABNORMAL LOW (ref 3.5–5.1)
Sodium: 141 mmol/L (ref 135–145)
Total Bilirubin: 0.5 mg/dL (ref 0.3–1.2)
Total Protein: 8.4 g/dL — ABNORMAL HIGH (ref 6.5–8.1)

## 2021-12-01 LAB — CBC WITH DIFFERENTIAL/PLATELET
Abs Immature Granulocytes: 0.02 10*3/uL (ref 0.00–0.07)
Basophils Absolute: 0 10*3/uL (ref 0.0–0.1)
Basophils Relative: 1 %
Eosinophils Absolute: 0.3 10*3/uL (ref 0.0–0.5)
Eosinophils Relative: 7 %
HCT: 36.6 % — ABNORMAL LOW (ref 39.0–52.0)
Hemoglobin: 11.7 g/dL — ABNORMAL LOW (ref 13.0–17.0)
Immature Granulocytes: 1 %
Lymphocytes Relative: 38 %
Lymphs Abs: 1.5 10*3/uL (ref 0.7–4.0)
MCH: 28.7 pg (ref 26.0–34.0)
MCHC: 32 g/dL (ref 30.0–36.0)
MCV: 89.9 fL (ref 80.0–100.0)
Monocytes Absolute: 0.4 10*3/uL (ref 0.1–1.0)
Monocytes Relative: 9 %
Neutro Abs: 1.9 10*3/uL (ref 1.7–7.7)
Neutrophils Relative %: 44 %
Platelets: 130 10*3/uL — ABNORMAL LOW (ref 150–400)
RBC: 4.07 MIL/uL — ABNORMAL LOW (ref 4.22–5.81)
RDW: 13.5 % (ref 11.5–15.5)
WBC: 4.1 10*3/uL (ref 4.0–10.5)
nRBC: 0 % (ref 0.0–0.2)

## 2021-12-01 LAB — HEMOGLOBIN A1C
Hgb A1c MFr Bld: 6.2 % — ABNORMAL HIGH (ref 4.8–5.6)
Mean Plasma Glucose: 131.24 mg/dL

## 2021-12-01 LAB — BRAIN NATRIURETIC PEPTIDE: B Natriuretic Peptide: 362 pg/mL — ABNORMAL HIGH (ref 0.0–100.0)

## 2021-12-13 ENCOUNTER — Emergency Department (HOSPITAL_COMMUNITY): Payer: 59

## 2021-12-13 ENCOUNTER — Encounter (HOSPITAL_COMMUNITY): Payer: Self-pay

## 2021-12-13 ENCOUNTER — Inpatient Hospital Stay (HOSPITAL_COMMUNITY)
Admission: EM | Admit: 2021-12-13 | Discharge: 2021-12-17 | DRG: 353 | Disposition: A | Discharge status: Home / Self Care | Payer: 59 | Attending: Internal Medicine | Admitting: Internal Medicine

## 2021-12-13 ENCOUNTER — Other Ambulatory Visit: Payer: Self-pay

## 2021-12-13 DIAGNOSIS — E119 Type 2 diabetes mellitus without complications: Secondary | ICD-10-CM

## 2021-12-13 DIAGNOSIS — I10 Essential (primary) hypertension: Secondary | ICD-10-CM | POA: Diagnosis present

## 2021-12-13 DIAGNOSIS — Z794 Long term (current) use of insulin: Secondary | ICD-10-CM

## 2021-12-13 DIAGNOSIS — J189 Pneumonia, unspecified organism: Secondary | ICD-10-CM | POA: Insufficient documentation

## 2021-12-13 DIAGNOSIS — K219 Gastro-esophageal reflux disease without esophagitis: Secondary | ICD-10-CM | POA: Insufficient documentation

## 2021-12-13 DIAGNOSIS — Z20822 Contact with and (suspected) exposure to covid-19: Secondary | ICD-10-CM | POA: Diagnosis present

## 2021-12-13 DIAGNOSIS — Z21 Asymptomatic human immunodeficiency virus [HIV] infection status: Secondary | ICD-10-CM | POA: Diagnosis present

## 2021-12-13 DIAGNOSIS — E66811 Obesity, class 1: Secondary | ICD-10-CM | POA: Diagnosis present

## 2021-12-13 DIAGNOSIS — E876 Hypokalemia: Secondary | ICD-10-CM | POA: Insufficient documentation

## 2021-12-13 DIAGNOSIS — J690 Pneumonitis due to inhalation of food and vomit: Secondary | ICD-10-CM

## 2021-12-13 DIAGNOSIS — K429 Umbilical hernia without obstruction or gangrene: Secondary | ICD-10-CM

## 2021-12-13 DIAGNOSIS — Z6831 Body mass index (BMI) 31.0-31.9, adult: Secondary | ICD-10-CM

## 2021-12-13 DIAGNOSIS — Z886 Allergy status to analgesic agent status: Secondary | ICD-10-CM

## 2021-12-13 DIAGNOSIS — K420 Umbilical hernia with obstruction, without gangrene: Principal | ICD-10-CM | POA: Diagnosis present

## 2021-12-13 DIAGNOSIS — E669 Obesity, unspecified: Secondary | ICD-10-CM | POA: Insufficient documentation

## 2021-12-13 DIAGNOSIS — E1169 Type 2 diabetes mellitus with other specified complication: Secondary | ICD-10-CM | POA: Insufficient documentation

## 2021-12-13 DIAGNOSIS — Z86711 Personal history of pulmonary embolism: Secondary | ICD-10-CM | POA: Insufficient documentation

## 2021-12-13 DIAGNOSIS — Z79899 Other long term (current) drug therapy: Secondary | ICD-10-CM

## 2021-12-13 DIAGNOSIS — I480 Paroxysmal atrial fibrillation: Secondary | ICD-10-CM | POA: Diagnosis present

## 2021-12-13 DIAGNOSIS — J9601 Acute respiratory failure with hypoxia: Secondary | ICD-10-CM | POA: Diagnosis present

## 2021-12-13 DIAGNOSIS — Z8616 Personal history of COVID-19: Secondary | ICD-10-CM

## 2021-12-13 DIAGNOSIS — K56609 Unspecified intestinal obstruction, unspecified as to partial versus complete obstruction: Principal | ICD-10-CM | POA: Diagnosis present

## 2021-12-13 DIAGNOSIS — Z7901 Long term (current) use of anticoagulants: Secondary | ICD-10-CM

## 2021-12-13 MED ORDER — ACETAMINOPHEN 500 MG PO TABS
1000.0000 mg | ORAL_TABLET | Freq: Once | ORAL | Status: AC
  Administered 2021-12-14: 1000 mg via ORAL
  Filled 2021-12-13: qty 2

## 2021-12-13 MED ORDER — LACTATED RINGERS IV BOLUS (SEPSIS)
1000.0000 mL | Freq: Once | INTRAVENOUS | Status: AC
  Administered 2021-12-14: 1000 mL via INTRAVENOUS

## 2021-12-13 NOTE — ED Provider Notes (Signed)
[facility] EMERGENCY DEPARTMENT Provider Note   CSN: [id number] Arrival date & time: 12/13/21  2029     History {Add pertinent medical, surgical, social history, OB history to HPI:1} No chief complaint on file.   Jose Lamb is a 61 y.o. male.  Patient presents to the emergency department for evaluation of multiple problems.  Patient reports that he has been feeling ill for 3 days.  He has been running a fever, has a persistent cough.  Patient reports nausea, vomiting and profuse watery diarrhea.  Abdomen feels more distended than usual and it feels like "water running through it".  He does have an umbilical hernia and reports that this seems bigger than usual.       Home Medications Prior to Admission medications   Medication Sig Start Date End Date Taking? Authorizing Provider  albuterol (VENTOLIN HFA) 108 (90 Base) MCG/ACT inhaler SMARTSIG:1-2 Puff(s) By Mouth Every 4 Hours PRN 10/23/19   [provider]  apixaban (ELIQUIS) 5 MG TABS tablet Take 2 tablets (10 mg total) by mouth 2 (two) times daily. Through 09/24/19.  Starting 09/25/19 evening take 1 tablet ('5mg'$ ) two times daily 09/19/19   Orson Eva, MD  famotidine (PEPCID) 20 MG tablet One after supper 01/08/20   Tanda Rockers, MD  furosemide (LASIX) 40 MG tablet Take 40 mg by mouth 2 (two) times daily as needed. 10/07/19   [provider]  LANTUS SOLOSTAR 100 UNIT/ML Solostar Pen PLEASE SEE ATTACHED FOR DETAILED DIRECTIONS 10/20/19   [provider]  pantoprazole (PROTONIX) 40 MG tablet Take 1 tablet (40 mg total) by mouth daily. Take 30-60 min before first meal of the day 01/08/20   Tanda Rockers, MD  Potassium Chloride ER 20 MEQ TBCR Take 1 tablet by mouth 2 (two) times daily. 10/20/19   [provider]  triamcinolone cream (KENALOG) 0.1 % Apply 1 application topically 2 (two) times daily. 02/27/21   Godfrey Pick, MD      Allergies    Diona Fanti [aspirin]    Review of Systems   Review of  Systems  Physical Exam Updated Vital Signs BP (!) 156/77   Pulse (!) 106   Temp (!) 100.9 F (38.3 C) (Oral)   Resp 18   Ht 6' (1.829 m)   Wt 113.4 kg   SpO2 95%   BMI 33.91 kg/m  Physical Exam Vitals and nursing note reviewed.  Constitutional:      General: He is not in acute distress.    Appearance: He is well-developed.  HENT:     Head: Normocephalic and atraumatic.     Mouth/Throat:     Mouth: Mucous membranes are moist.  Eyes:     General: Vision grossly intact. Gaze aligned appropriately.     Extraocular Movements: Extraocular movements intact.     Conjunctiva/sclera: Conjunctivae normal.  Cardiovascular:     Rate and Rhythm: Normal rate and regular rhythm.     Pulses: Normal pulses.     Heart sounds: Normal heart sounds, S1 normal and S2 normal. No murmur heard.    No friction rub. No gallop.  Pulmonary:     Effort: Pulmonary effort is normal. No respiratory distress.     Breath sounds: Normal breath sounds.  Abdominal:     General: There is distension.     Palpations: Abdomen is soft.     Tenderness: There is generalized abdominal tenderness. There is no guarding or rebound.     Hernia: A hernia is present. Hernia  is present in the umbilical area (soft, reducible).  Musculoskeletal:        General: No swelling.     Cervical back: Full passive range of motion without pain, normal range of motion and neck supple. No pain with movement, spinous process tenderness or muscular tenderness. Normal range of motion.     Right lower leg: No edema.     Left lower leg: No edema.  Skin:    General: Skin is warm and dry.     Capillary Refill: Capillary refill takes less than 2 seconds.     Findings: No ecchymosis, erythema, lesion or wound.  Neurological:     Mental Status: He is alert and oriented to person, place, and time.     GCS: GCS eye subscore is 4. GCS verbal subscore is 5. GCS motor subscore is 6.     Cranial Nerves: Cranial nerves 2-12 are intact.      Sensory: Sensation is intact.     Motor: Motor function is intact. No weakness or abnormal muscle tone.     Coordination: Coordination is intact.  Psychiatric:        Mood and Affect: Mood normal.        Speech: Speech normal.        Behavior: Behavior normal.     ED Results / Procedures / Treatments   Labs (all labs ordered are listed, but only abnormal results are displayed) Labs Reviewed - No data to display  EKG None  Radiology No results found.  Procedures Procedures  {Document cardiac monitor, telemetry assessment procedure when appropriate:1}  Medications Ordered in ED Medications - No data to display  ED Course/ Medical Decision Making/ A&P                           Medical Decision Making  ***  {Document critical care time when appropriate:1} {Document review of labs and clinical decision tools ie heart score, Chads2Vasc2 etc:1}  {Document your independent review of radiology images, and any outside records:1} {Document your discussion with family members, caretakers, and with consultants:1} {Document social determinants of health affecting pt's care:1} {Document your decision making why or why not admission, treatments were needed:1} Final Clinical Impression(s) / ED Diagnoses Final diagnoses:  None    Rx / DC Orders ED Discharge Orders     None

## 2021-12-13 NOTE — ED Triage Notes (Signed)
POV from home. Cc of umbilical hernia pain for the last few days. Says it is bigger over the last couple days. Called pcp and they said to come here.

## 2021-12-14 ENCOUNTER — Encounter (HOSPITAL_COMMUNITY): Payer: Self-pay | Admitting: Family Medicine

## 2021-12-14 ENCOUNTER — Inpatient Hospital Stay (HOSPITAL_COMMUNITY): Payer: 59

## 2021-12-14 ENCOUNTER — Emergency Department (HOSPITAL_COMMUNITY): Payer: 59

## 2021-12-14 DIAGNOSIS — E669 Obesity, unspecified: Secondary | ICD-10-CM | POA: Diagnosis present

## 2021-12-14 DIAGNOSIS — E1169 Type 2 diabetes mellitus with other specified complication: Secondary | ICD-10-CM | POA: Insufficient documentation

## 2021-12-14 DIAGNOSIS — K56609 Unspecified intestinal obstruction, unspecified as to partial versus complete obstruction: Secondary | ICD-10-CM | POA: Diagnosis not present

## 2021-12-14 DIAGNOSIS — I480 Paroxysmal atrial fibrillation: Secondary | ICD-10-CM | POA: Diagnosis present

## 2021-12-14 DIAGNOSIS — Z8616 Personal history of COVID-19: Secondary | ICD-10-CM | POA: Diagnosis not present

## 2021-12-14 DIAGNOSIS — Z86711 Personal history of pulmonary embolism: Secondary | ICD-10-CM | POA: Insufficient documentation

## 2021-12-14 DIAGNOSIS — J690 Pneumonitis due to inhalation of food and vomit: Secondary | ICD-10-CM | POA: Diagnosis present

## 2021-12-14 DIAGNOSIS — E876 Hypokalemia: Secondary | ICD-10-CM | POA: Insufficient documentation

## 2021-12-14 DIAGNOSIS — Z20822 Contact with and (suspected) exposure to covid-19: Secondary | ICD-10-CM | POA: Diagnosis present

## 2021-12-14 DIAGNOSIS — J189 Pneumonia, unspecified organism: Secondary | ICD-10-CM | POA: Insufficient documentation

## 2021-12-14 DIAGNOSIS — I4891 Unspecified atrial fibrillation: Secondary | ICD-10-CM | POA: Diagnosis not present

## 2021-12-14 DIAGNOSIS — I10 Essential (primary) hypertension: Secondary | ICD-10-CM | POA: Diagnosis present

## 2021-12-14 DIAGNOSIS — Z6831 Body mass index (BMI) 31.0-31.9, adult: Secondary | ICD-10-CM | POA: Diagnosis not present

## 2021-12-14 DIAGNOSIS — Z21 Asymptomatic human immunodeficiency virus [HIV] infection status: Secondary | ICD-10-CM | POA: Diagnosis present

## 2021-12-14 DIAGNOSIS — Z7901 Long term (current) use of anticoagulants: Secondary | ICD-10-CM | POA: Diagnosis not present

## 2021-12-14 DIAGNOSIS — J9601 Acute respiratory failure with hypoxia: Secondary | ICD-10-CM | POA: Diagnosis present

## 2021-12-14 DIAGNOSIS — K420 Umbilical hernia with obstruction, without gangrene: Secondary | ICD-10-CM | POA: Diagnosis present

## 2021-12-14 DIAGNOSIS — Z79899 Other long term (current) drug therapy: Secondary | ICD-10-CM | POA: Diagnosis not present

## 2021-12-14 DIAGNOSIS — K56600 Partial intestinal obstruction, unspecified as to cause: Secondary | ICD-10-CM | POA: Diagnosis not present

## 2021-12-14 DIAGNOSIS — K429 Umbilical hernia without obstruction or gangrene: Secondary | ICD-10-CM | POA: Diagnosis not present

## 2021-12-14 DIAGNOSIS — E119 Type 2 diabetes mellitus without complications: Secondary | ICD-10-CM | POA: Diagnosis present

## 2021-12-14 DIAGNOSIS — K219 Gastro-esophageal reflux disease without esophagitis: Secondary | ICD-10-CM | POA: Insufficient documentation

## 2021-12-14 DIAGNOSIS — Z886 Allergy status to analgesic agent status: Secondary | ICD-10-CM | POA: Diagnosis not present

## 2021-12-14 DIAGNOSIS — Z794 Long term (current) use of insulin: Secondary | ICD-10-CM | POA: Diagnosis not present

## 2021-12-14 LAB — COMPREHENSIVE METABOLIC PANEL
ALT: 21 U/L (ref 0–44)
ALT: 22 U/L (ref 0–44)
AST: 24 U/L (ref 15–41)
AST: 27 U/L (ref 15–41)
Albumin: 3.9 g/dL (ref 3.5–5.0)
Albumin: 4 g/dL (ref 3.5–5.0)
Alkaline Phosphatase: 66 U/L (ref 38–126)
Alkaline Phosphatase: 70 U/L (ref 38–126)
Anion gap: 10 (ref 5–15)
Anion gap: 7 (ref 5–15)
BUN: 20 mg/dL (ref 6–20)
BUN: 23 mg/dL — ABNORMAL HIGH (ref 6–20)
CO2: 26 mmol/L (ref 22–32)
CO2: 28 mmol/L (ref 22–32)
Calcium: 8.4 mg/dL — ABNORMAL LOW (ref 8.9–10.3)
Calcium: 8.4 mg/dL — ABNORMAL LOW (ref 8.9–10.3)
Chloride: 101 mmol/L (ref 98–111)
Chloride: 99 mmol/L (ref 98–111)
Creatinine, Ser: 1.05 mg/dL (ref 0.61–1.24)
Creatinine, Ser: 1.39 mg/dL — ABNORMAL HIGH (ref 0.61–1.24)
GFR, Estimated: 58 mL/min — ABNORMAL LOW (ref >60)
GFR, Estimated: >60 mL/min (ref >60)
Glucose, Bld: 128 mg/dL — ABNORMAL HIGH (ref 70–99)
Glucose, Bld: 133 mg/dL — ABNORMAL HIGH (ref 70–99)
Potassium: 3 mmol/L — ABNORMAL LOW (ref 3.5–5.1)
Potassium: 3.2 mmol/L — ABNORMAL LOW (ref 3.5–5.1)
Sodium: 135 mmol/L (ref 135–145)
Sodium: 136 mmol/L (ref 135–145)
Total Bilirubin: 1.2 mg/dL (ref 0.3–1.2)
Total Bilirubin: 1.3 mg/dL — ABNORMAL HIGH (ref 0.3–1.2)
Total Protein: 8.5 g/dL — ABNORMAL HIGH (ref 6.5–8.1)
Total Protein: 8.9 g/dL — ABNORMAL HIGH (ref 6.5–8.1)

## 2021-12-14 LAB — GLUCOSE, CAPILLARY
Glucose-Capillary: 114 mg/dL — ABNORMAL HIGH (ref 70–99)
Glucose-Capillary: 114 mg/dL — ABNORMAL HIGH (ref 70–99)
Glucose-Capillary: 117 mg/dL — ABNORMAL HIGH (ref 70–99)
Glucose-Capillary: 126 mg/dL — ABNORMAL HIGH (ref 70–99)
Glucose-Capillary: 136 mg/dL — ABNORMAL HIGH (ref 70–99)

## 2021-12-14 LAB — CBC WITH DIFFERENTIAL/PLATELET
Abs Immature Granulocytes: 0.01 10*3/uL (ref 0.00–0.07)
Basophils Absolute: 0 10*3/uL (ref 0.0–0.1)
Basophils Relative: 1 %
Eosinophils Absolute: 0 10*3/uL (ref 0.0–0.5)
Eosinophils Relative: 0 %
HCT: 40.1 % (ref 39.0–52.0)
Hemoglobin: 13.2 g/dL (ref 13.0–17.0)
Immature Granulocytes: 0 %
Lymphocytes Relative: 22 %
Lymphs Abs: 0.9 10*3/uL (ref 0.7–4.0)
MCH: 29.3 pg (ref 26.0–34.0)
MCHC: 32.9 g/dL (ref 30.0–36.0)
MCV: 89.1 fL (ref 80.0–100.0)
Monocytes Absolute: 0.4 10*3/uL (ref 0.1–1.0)
Monocytes Relative: 10 %
Neutro Abs: 2.7 10*3/uL (ref 1.7–7.7)
Neutrophils Relative %: 67 %
Platelets: 129 10*3/uL — ABNORMAL LOW (ref 150–400)
RBC: 4.5 MIL/uL (ref 4.22–5.81)
RDW: 13.4 % (ref 11.5–15.5)
WBC: 4 10*3/uL (ref 4.0–10.5)
nRBC: 0 % (ref 0.0–0.2)

## 2021-12-14 LAB — PROTIME-INR
INR: 1.5 — ABNORMAL HIGH (ref 0.8–1.2)
Prothrombin Time: 18 seconds — ABNORMAL HIGH (ref 11.4–15.2)

## 2021-12-14 LAB — APTT: aPTT: 49 seconds — ABNORMAL HIGH (ref 24–36)

## 2021-12-14 LAB — RESP PANEL BY RT-PCR (FLU A&B, COVID) ARPGX2
Influenza A by PCR: NEGATIVE
Influenza B by PCR: NEGATIVE
SARS Coronavirus 2 by RT PCR: NEGATIVE

## 2021-12-14 LAB — LACTIC ACID, PLASMA
Lactic Acid, Venous: 1.3 mmol/L (ref 0.5–1.9)
Lactic Acid, Venous: 1.8 mmol/L (ref 0.5–1.9)

## 2021-12-14 LAB — MAGNESIUM: Magnesium: 2 mg/dL (ref 1.7–2.4)

## 2021-12-14 MED ORDER — SODIUM CHLORIDE 0.9 % IV SOLN
3.0000 g | Freq: Three times a day (TID) | INTRAVENOUS | Status: DC
  Administered 2021-12-14 – 2021-12-16 (×7): 3 g via INTRAVENOUS
  Filled 2021-12-14 (×7): qty 8

## 2021-12-14 MED ORDER — ACETAMINOPHEN 325 MG PO TABS: 650.0000 mg | ORAL_TABLET | Freq: Four times a day (QID) | ORAL | Status: DC | PRN

## 2021-12-14 MED ORDER — HEPARIN SODIUM (PORCINE) 5000 UNIT/ML IJ SOLN
5000.0000 [IU] | Freq: Three times a day (TID) | INTRAMUSCULAR | Status: DC
  Administered 2021-12-14 – 2021-12-17 (×9): 5000 [IU] via SUBCUTANEOUS
  Filled 2021-12-14 (×9): qty 1

## 2021-12-14 MED ORDER — INSULIN ASPART 100 UNIT/ML IJ SOLN
0.0000 [IU] | Freq: Four times a day (QID) | INTRAMUSCULAR | Status: DC
  Administered 2021-12-14 – 2021-12-17 (×4): 2 [IU] via SUBCUTANEOUS

## 2021-12-14 MED ORDER — ONDANSETRON HCL 4 MG/2ML IJ SOLN
4.0000 mg | Freq: Four times a day (QID) | INTRAMUSCULAR | Status: DC | PRN
  Administered 2021-12-14 – 2021-12-17 (×3): 4 mg via INTRAVENOUS
  Filled 2021-12-14 (×3): qty 2

## 2021-12-14 MED ORDER — PANTOPRAZOLE SODIUM 40 MG IV SOLR
40.0000 mg | Freq: Two times a day (BID) | INTRAVENOUS | Status: DC
  Administered 2021-12-14 – 2021-12-17 (×7): 40 mg via INTRAVENOUS
  Filled 2021-12-14 (×7): qty 10

## 2021-12-14 MED ORDER — ALBUTEROL SULFATE (2.5 MG/3ML) 0.083% IN NEBU: 2.5000 mg | INHALATION_SOLUTION | RESPIRATORY_TRACT | Status: DC | PRN

## 2021-12-14 MED ORDER — ONDANSETRON HCL 4 MG/2ML IJ SOLN
4.0000 mg | Freq: Once | INTRAMUSCULAR | Status: AC
  Administered 2021-12-14: 4 mg via INTRAVENOUS
  Filled 2021-12-14: qty 2

## 2021-12-14 MED ORDER — ONDANSETRON HCL 4 MG PO TABS: 4.0000 mg | ORAL_TABLET | Freq: Four times a day (QID) | ORAL | Status: DC | PRN

## 2021-12-14 MED ORDER — POTASSIUM CHLORIDE IN NACL 40-0.9 MEQ/L-% IV SOLN: INTRAVENOUS | Status: DC

## 2021-12-14 MED ORDER — IOHEXOL 300 MG/ML  SOLN
100.0000 mL | Freq: Once | INTRAMUSCULAR | Status: AC | PRN
  Administered 2021-12-14: 100 mL via INTRAVENOUS

## 2021-12-14 MED ORDER — SODIUM CHLORIDE 0.9 % IV SOLN
12.5000 mg | Freq: Three times a day (TID) | INTRAVENOUS | Status: DC | PRN
  Administered 2021-12-14 – 2021-12-15 (×2): 12.5 mg via INTRAVENOUS
  Filled 2021-12-14 (×2): qty 0.5

## 2021-12-14 MED ORDER — KETOROLAC TROMETHAMINE 30 MG/ML IJ SOLN
30.0000 mg | Freq: Once | INTRAMUSCULAR | Status: AC
  Administered 2021-12-14: 30 mg via INTRAVENOUS
  Filled 2021-12-14: qty 1

## 2021-12-14 MED ORDER — MORPHINE SULFATE (PF) 2 MG/ML IV SOLN
2.0000 mg | INTRAVENOUS | Status: DC | PRN
  Administered 2021-12-14: 2 mg via INTRAVENOUS
  Filled 2021-12-14: qty 1

## 2021-12-14 MED ORDER — MORPHINE SULFATE (PF) 2 MG/ML IV SOLN
2.0000 mg | INTRAVENOUS | Status: DC | PRN
  Administered 2021-12-14 – 2021-12-16 (×12): 2 mg via INTRAVENOUS
  Filled 2021-12-14 (×13): qty 1

## 2021-12-14 MED ORDER — ACETAMINOPHEN 650 MG RE SUPP: 650.0000 mg | Freq: Four times a day (QID) | RECTAL | Status: DC | PRN

## 2021-12-14 NOTE — Progress Notes (Signed)
Patient seen and examined; admitted after midnight secondary to abdominal pain, nausea, vomiting and general malaise.  Seem to have progressed to include fever, nonproductive coughing spells and shortness of breath.  Work-up demonstrating findings of high-grade small bowel obstruction secondary to abdominal hernia with early incarceration and also findings of what appears to be aspiration pneumonia.  Hemodynamically stable.  Reporting abdominal discomfort.  No chest pain.  Please refer to H&P written byDr. Clearence Ped for further info/details on admission.  Plan: -start flutter valve. -continue current IV antibiotics and bronchodilator management . -follow electrolytes and replete as needed. -continue to maintain adequate hydration and supportive care. -follow general surgery recommendations. -continue NGT with intermittent suctioning for now; continue NPO. -follow clinical response.  Barton Dubois MD [phone]

## 2021-12-14 NOTE — Assessment & Plan Note (Signed)
-   Continue PT PI

## 2021-12-14 NOTE — Progress Notes (Signed)
Engineer, materials, notified of bladder scan showing 0 urine. Glenard Haring, RN at bedside following up with MD

## 2021-12-14 NOTE — H&P (Signed)
History and Physical    Patient: Jose Lamb FWY:[id number] DOB: [birth year] DOA: 12/13/2021 DOS: the patient was seen and examined on 12/14/2021 PCP: [name], [name]  Patient coming from: Home  Chief Complaint: No chief complaint on file.  HPI: Jose Lamb is a 60 y.o. male with medical history significant of atrial fibrillation, pulmonary embolism, essential hypertension duodenal ulcer, long COVID, and more presents to the ED complain of abdominal pain.  Patient reports he has had abdominal pain for 4 days.  His abdomen has been swollen since then 2.  The pain is described as a pressure that is constant.  He has had diarrhea 4-5 times per day that is nonbloody.  Patient denies having melena as well.  At first having a bowel movement would relieve the pressure, but as the pain progressed the bowel movements no longer relieved the pain.  Patient reports that he has felt feverish and chilled at home without a measured fever.  In the ER his initial temperature was 100.9 and it went up after Tylenol.  Patient reports that he has been vomiting 3-4 times per day which is also nonbloody.  It appears chocolate brown-colored.  Patient reports has been having to sleep sitting up because his pain is worse when he lays flat.  He has had choking spells and coughing spells after vomiting.  Patient reports he has been coughing for couple weeks anyways.  He went to an outpatient provider who prescribed Tessalon Perles, steroid, albuterol.  It helped initially, but then his symptoms returned.  He is still coughing and reports it is productive of white mucus.  He denies chest pain but admits to shortness of breath.  He has been using his nebulizer 3 times per day.  Patient has no other complaints at this time.    Patient does not smoke, does not drink, does not use illicit drugs.  He is vaccinated for COVID.  Patient is full code. Review of Systems: As mentioned in the history of present illness. All other  systems reviewed and are negative. 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 He has never used smokeless tobacco. He reports that he does not drink alcohol and does not use drugs.  Allergies  Allergen Reactions   Asa [Aspirin] Other (See Comments)    Nose bleeds    Family History  Problem Relation Age of Onset   Ulcers Mother    Heart Problems Mother    Colon cancer Neg Hx     Prior to Admission medications   Medication Sig Start Date End Date Taking? Authorizing Provider  albuterol (VENTOLIN HFA) 108 (90 Base) MCG/ACT inhaler SMARTSIG:1-2 Puff(s) By Mouth Every 4 Hours PRN 10/23/19   [provider]  apixaban (  ELIQUIS) 5 MG TABS tablet Take 2 tablets (10 mg total) by mouth 2 (two) times daily. Through 09/24/19.  Starting 09/25/19 evening take 1 tablet ('5mg'$ ) two times daily 09/19/19   Orson Eva, MD  famotidine (PEPCID) 20 MG tablet One after supper 01/08/20   Tanda Rockers, MD  furosemide (LASIX) 40 MG tablet Take 40 mg by mouth 2 (two) times daily as needed. 10/07/19   [provider]  LANTUS SOLOSTAR 100 UNIT/ML Solostar Pen PLEASE SEE ATTACHED FOR DETAILED DIRECTIONS 10/20/19   [provider]  pantoprazole (PROTONIX) 40 MG tablet  Take 1 tablet (40 mg total) by mouth daily. Take 30-60 min before first meal of the day 01/08/20   Tanda Rockers, MD  Potassium Chloride ER 20 MEQ TBCR Take 1 tablet by mouth 2 (two) times daily. 10/20/19   [provider]  triamcinolone cream (KENALOG) 0.1 % Apply 1 application topically 2 (two) times daily. 02/27/21   Godfrey Pick, MD    Physical Exam: Vitals:   12/14/21 0432 12/14/21 0441 12/14/21 0500 12/14/21 0557  BP:   (!) 144/78 111/87  Pulse: 99 97 (!) 101 94  Resp: (!) 22 (!) 32 (!) 37 20  Temp: 99.8 F (37.7 C)   98.9 F (37.2 C)  TempSrc: Oral   Oral  SpO2: 92% 94% 97% 98%  Weight:    106.8 kg  Height:    6' (1.829 m)   1.  General: Patient lying supine in bed, bed elevated, appears to be in pain   2. Psychiatric: Alert and oriented x 3, mood and behavior normal for situation, pleasant and cooperative with exam   3. Neurologic: Speech and language are normal, face is symmetric, moves all 4 extremities voluntarily, at baseline without acute deficits on limited exam   4. HEENMT:  Head is atraumatic, normocephalic, pupils reactive to light, neck is supple, trachea is midline, mucous membranes are moist   5. Respiratory : Lungs are clear to auscultation bilaterally without wheezing, rhonchi, rales, no cyanosis, no increase in work of breathing or accessory muscle use   6. Cardiovascular : Heart rate normal, rhythm is regular, no murmurs, rubs or gallops, no peripheral edema, peripheral pulses palpated   7. Gastrointestinal:  Abdomen is taut, distended, tender to palpation, bowel sounds active, no masses or organomegaly palpated   8. Skin:  Skin is warm, dry and intact without rashes, acute lesions, or ulcers on limited exam   9.Musculoskeletal:  No acute deformities or trauma, no asymmetry in tone, no peripheral edema, peripheral pulses palpated, no tenderness to palpation in the extremities  Data Reviewed: In the ED Temp 100.9, heart rate 97-106,  respiratory rate 18-26, blood pressure 110/77-156/81, satting 90-97% Borderline leukopenia at 4.0, hemoglobin 13.2, platelets 129 Potassium 3.2 Lactic acid 1.3 Negative COVID and flu Blood cultures pending CT abdomen shows SBO secondary to hernia and aspiration versus infiltrates in the lower lungs  Assessment and Plan: * SBO (small bowel obstruction) (HCC) - The scan shows high-grade SBO secondary to umbilical - GEN surge to see in the a.m. - Pain control with morphine - NG tube set to low intermittent suction - Continue to monitor  GERD (gastroesophageal reflux disease) - Continue PT PI  Diabetes mellitus type 2 in obese (HCC) - Continue sliding scale coverage - Continue to monitor  CAP (community acquired pneumonia) - Likely related to aspiration given episodes of vomiting - Continue Unasyn --Legionella urine antigens - Expectorated sputum culture - Blood cultures pending -  Patient was febrile, tachypneic no evidence of endorgan damage - Tylenol did not correct the fever, but Toradol given afterwards - COVID and flu negative - Continue to monitor  Hypokalemia - Replace and recheck  History of pulmonary embolus (PE) - Holding Eliquis in the setting of NG tube with suction and awaiting surgical eval  Acute respiratory failure with hypoxia ([place]) - Occurred after an episode of vomiting - Could be related to aspiration - Patient continues to require 2 L nasal cannula - Start Unasyn - Albuterol as needed - Wean off nasal cannula as tolerated - Continue to monitor      Advance Care Planning:   Code Status: Full Code   Consults: General surgery  Family Communication: No family at bedside  Severity of Illness: The appropriate patient status for this patient is INPATIENT. Inpatient status is judged to be reasonable and necessary in order to provide the required intensity of service to ensure the patient's safety. The patient's presenting symptoms, physical exam  findings, and initial radiographic and laboratory data in the context of their chronic comorbidities is felt to place them at high risk for further clinical deterioration. Furthermore, it is not anticipated that the patient will be medically stable for discharge from the hospital within 2 midnights of admission.   * I certify that at the point of admission it is my clinical judgment that the patient will require inpatient hospital care spanning beyond 2 midnights from the point of admission due to high intensity of service, high risk for further deterioration and high frequency of surveillance required.*  Author: Rolla Plate, DO 12/14/2021 6:02 AM  For on call review [email or web address].

## 2021-12-14 NOTE — Progress Notes (Signed)
Per pt the NT came to take his CBG and discarded urine in the urinal, unable to state volume of output, RN aware

## 2021-12-14 NOTE — Consult Note (Addendum)
Reason for Consult: Partial small bowel obstruction, umbilical hernia Referring Physician: Dr. Dyann Kief   HPI: Patient is a 61 year old male with a history of paroxysmal atrial fibrillation, pulmonary embolism, duodenal ulcer, and hypertension who presents to the hospital with a 4-day history of worsening abdominal pain and swelling.  He initially was having some diarrhea.  He did started having multiple episodes of emesis which were brown in color.  He did start having some shortness of breath but no chest pain.  He presented to the emergency room and a CT scan of the abdomen and pelvis revealed a bowel obstruction due to an umbilical hernia as well as bilateral lower lobe infiltrates and bronchial thickening consistent with pneumonia or aspiration.  He was admitted to the hospital for further evaluation and treatment.  He had a nasogastric tube but it fell out.  Multiple attempts to replace it were unsuccessful.  The patient states that he has no significant abdominal pain and he did have a bowel movement earlier today.  He has not had any episodes of emesis since the NG tube came out.  Of note was the fact that I was not aware of the consultation until this evening.  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[place])    Perforated duodenal ulcer ([place]) 2015   Contained without need for surgical intervention   Pneumonia due to COVID-19 virus    Diagnosed August 2021    Past Surgical History:  Procedure Laterality Date   COLONOSCOPY  Sept 2015   [place]: transverse colon polyp with pathology reporting lymphoid nodule    ESOPHAGOGASTRODUODENOSCOPY N/A 03/30/2014   Procedure: ESOPHAGOGASTRODUODENOSCOPY (EGD);  Surgeon: Daneil Dolin, MD;  Location: [facility];  Service: Endoscopy;  Laterality: N/A;  31    Family  History  Problem Relation Age of Onset   Ulcers Mother    Heart Problems Mother    Colon cancer Neg Hx     Social History:  reports that he has never smoked. He has never used smokeless tobacco. He reports that he does not drink alcohol and does not use drugs.  Allergies:  Allergies  Allergen Reactions   Asa [Aspirin] Other (See Comments)    Nose bleeds    Medications: I have reviewed the patient's current medications. Prior to Admission:  Medications Prior to Admission  Medication Sig Dispense Refill Last Dose   albuterol (PROVENTIL) (2.5 MG/3ML) 0.083% nebulizer solution Take 2.5 mg by nebulization every 4 (four) hours.   12/13/2021   albuterol (VENTOLIN HFA) 108 (90 Base) MCG/ACT inhaler 1 puff every 4 (four) hours as needed for wheezing or shortness of breath.   12/13/2021   famotidine (PEPCID) 20 MG tablet One after supper (Patient taking differently: Take 20 mg by mouth daily. One after supper) 30 tablet 11 12/13/2021   furosemide (LASIX) 40 MG tablet Take 40 mg by mouth 2 (two) times daily as needed for fluid.   12/13/2021   LANTUS SOLOSTAR 100 UNIT/ML Solostar Pen 16 Units daily.   12/13/2021   pantoprazole (PROTONIX) 40 MG tablet Take 1 tablet (40 mg total) by mouth daily. Take 30-60 min before first meal of the day 30 tablet 2 12/13/2021   Potassium Chloride ER 20 MEQ TBCR Take 1 tablet by mouth 2 (two) times daily.  12/13/2021   XARELTO 20 MG TABS tablet Take 20 mg by mouth daily.   12/13/2021 at 1800   apixaban (ELIQUIS) 5 MG TABS tablet Take 2 tablets (10 mg total) by mouth 2 (two) times daily. Through 09/24/19.  Starting 09/25/19 evening take 1 tablet ('5mg'$ ) two times daily (Patient not taking: Reported on 12/14/2021) 72 tablet 0 Not Taking   triamcinolone cream (KENALOG) 0.1 % Apply 1 application topically 2 (two) times daily. (Patient not taking: Reported on 12/14/2021) 30 g 0 Not Taking    Results for orders placed or performed during the hospital encounter of 12/13/21 (from  the past 48 hour(s))  Resp Panel by RT-PCR (Flu A&B, Covid) Anterior Nasal Swab     Status: None   Collection Time: 12/14/21 12:03 AM   Specimen: Anterior Nasal Swab  Result Value Ref Range   SARS Coronavirus 2 by RT PCR NEGATIVE NEGATIVE    Comment: (NOTE) SARS-CoV-2 target nucleic acids are NOT DETECTED.  The SARS-CoV-2 RNA is generally detectable in upper respiratory specimens during the acute phase of infection. The lowest concentration of SARS-CoV-2 viral copies this assay can detect is 138 copies/mL. A negative result does not preclude SARS-Cov-2 infection and should not be used as the sole basis for treatment or other patient management decisions. A negative result may occur with  improper specimen collection/handling, submission of specimen other than nasopharyngeal swab, presence of viral mutation(s) within the areas targeted by this assay, and inadequate number of viral copies(<138 copies/mL). A negative result must be combined with clinical observations, patient history, and epidemiological information. The expected result is Negative.  Fact Sheet for Patients:  EntrepreneurPulse.com.au  Fact Sheet for Healthcare Providers:  IncredibleEmployment.be  This test is no t yet approved or cleared by the [place] FDA and  has been authorized for detection and/or diagnosis of SARS-CoV-2 by FDA under an Emergency Use Authorization (EUA). This EUA will remain  in effect (meaning this test can be used) for the duration of the COVID-19 declaration under Section 564(b)(1) of the Act, 21 U.S.C.section 360bbb-3(b)(1), unless the authorization is terminated  or revoked sooner.       Influenza A by PCR NEGATIVE NEGATIVE   Influenza B by PCR NEGATIVE NEGATIVE    Comment: (NOTE) The Xpert Xpress SARS-CoV-2/FLU/RSV plus assay is intended as an aid in the diagnosis of influenza from Nasopharyngeal swab specimens and should not be used as a sole  basis for treatment. Nasal washings and aspirates are unacceptable for Xpert Xpress SARS-CoV-2/FLU/RSV testing.  Fact Sheet for Patients: EntrepreneurPulse.com.au  Fact Sheet for Healthcare Providers: IncredibleEmployment.be  This test is not yet approved or cleared by the [place] FDA and has been authorized for detection and/or diagnosis of SARS-CoV-2 by FDA under an Emergency Use Authorization (EUA). This EUA will remain in effect (meaning this test can be used) for the duration of the COVID-19 declaration under Section 564(b)(1) of the Act, 21 U.S.C. section 360bbb-3(b)(1), unless the authorization is terminated or revoked.  Performed at [facility], [address] [place], [place] [address]   Lactic acid, plasma     Status: None   Collection Time: 12/14/21 12:09 AM  Result Value Ref Range   Lactic Acid, Venous 1.3 0.5 - 1.9 mmol/L    Comment: Performed at [facility], [address], [place], [place] [address]  Comprehensive metabolic panel     Status: Abnormal   Collection Time: 12/14/21 12:09 AM  Result Value Ref Range   Sodium 136 135 -  145 mmol/L   Potassium 3.2 (L) 3.5 - 5.1 mmol/L   Chloride 101 98 - 111 mmol/L   CO2 28 22 - 32 mmol/L   Glucose, Bld 128 (H) 70 - 99 mg/dL    Comment: Glucose reference range applies only to samples taken after fasting for at least 8 hours.   BUN 20 6 - 20 mg/dL   Creatinine, Ser 1.05 0.61 - 1.24 mg/dL   Calcium 8.4 (L) 8.9 - 10.3 mg/dL   Total Protein 8.9 (H) 6.5 - 8.1 g/dL   Albumin 4.0 3.5 - 5.0 g/dL   AST 27 15 - 41 U/L   ALT 22 0 - 44 U/L   Alkaline Phosphatase 70 38 - 126 U/L   Total Bilirubin 1.2 0.3 - 1.2 mg/dL   GFR, Estimated >60 >60 mL/min    Comment: (NOTE) Calculated using the CKD-EPI Creatinine Equation (2021)    Anion gap 7 5 - 15    Comment: Performed at [facility], [address], [place], [place] [address]  CBC with Differential     Status: Abnormal   Collection  Time: 12/14/21 12:09 AM  Result Value Ref Range   WBC 4.0 4.0 - 10.5 K/uL   RBC 4.50 4.22 - 5.81 MIL/uL   Hemoglobin 13.2 13.0 - 17.0 g/dL   HCT 40.1 39.0 - 52.0 %   MCV 89.1 80.0 - 100.0 fL   MCH 29.3 26.0 - 34.0 pg   MCHC 32.9 30.0 - 36.0 g/dL   RDW 13.4 11.5 - 15.5 %   Platelets 129 (L) 150 - 400 K/uL   nRBC 0.0 0.0 - 0.2 %   Neutrophils Relative % 67 %   Neutro Abs 2.7 1.7 - 7.7 K/uL   Lymphocytes Relative 22 %   Lymphs Abs 0.9 0.7 - 4.0 K/uL   Monocytes Relative 10 %   Monocytes Absolute 0.4 0.1 - 1.0 K/uL   Eosinophils Relative 0 %   Eosinophils Absolute 0.0 0.0 - 0.5 K/uL   Basophils Relative 1 %   Basophils Absolute 0.0 0.0 - 0.1 K/uL   Immature Granulocytes 0 %   Abs Immature Granulocytes 0.01 0.00 - 0.07 K/uL    Comment: Performed at [facility], [address] [place], [place] [address]  Protime-INR     Status: Abnormal   Collection Time: 12/14/21 12:09 AM  Result Value Ref Range   Prothrombin Time 18.0 (H) 11.4 - 15.2 seconds   INR 1.5 (H) 0.8 - 1.2    Comment: (NOTE) INR goal varies based on device and disease states. Performed at [facility], [address], [place], [place] [address]   APTT     Status: Abnormal   Collection Time: 12/14/21 12:09 AM  Result Value Ref Range   aPTT 49 (H) 24 - 36 seconds    Comment:        IF BASELINE aPTT IS ELEVATED, SUGGEST PATIENT RISK ASSESSMENT BE USED TO DETERMINE APPROPRIATE ANTICOAGULANT THERAPY. Performed at [facility], [address] [place], [place] [address]   Blood Culture (routine x 2)     Status: None (Preliminary result)   Collection Time: 12/14/21 12:09 AM   Specimen: BLOOD  Result Value Ref Range   Specimen Description BLOOD BLOOD RIGHT ARM    Special Requests      BOTTLES DRAWN AEROBIC AND ANAEROBIC Blood Culture adequate volume   Culture      NO GROWTH < 12 HOURS Performed at [facility], [address], [place], [place]  [address]    Report Status PENDING   Blood Culture (routine x 2)      Status: None (Preliminary result)   Collection Time: 12/14/21 12:09 AM   Specimen: BLOOD  Result Value Ref Range   Specimen Description BLOOD BLOOD LEFT HAND    Special Requests      BOTTLES DRAWN AEROBIC AND ANAEROBIC Blood Culture adequate volume   Culture      NO GROWTH < 12 HOURS Performed at [facility], [address], [place], [place] [address]    Report Status PENDING   Lactic acid, plasma     Status: None   Collection Time: 12/14/21  2:23 AM  Result Value Ref Range   Lactic Acid, Venous 1.8 0.5 - 1.9 mmol/L    Comment: Performed at [facility], [address] [place], [place] [address]  Comprehensive metabolic panel     Status: Abnormal   Collection Time: 12/14/21  5:55 AM  Result Value Ref Range   Sodium 135 135 - 145 mmol/L   Potassium 3.0 (L) 3.5 - 5.1 mmol/L   Chloride 99 98 - 111 mmol/L   CO2 26 22 - 32 mmol/L   Glucose, Bld 133 (H) 70 - 99 mg/dL    Comment: Glucose reference range applies only to samples taken after fasting for at least 8 hours.   BUN 23 (H) 6 - 20 mg/dL   Creatinine, Ser 1.39 (H) 0.61 - 1.24 mg/dL   Calcium 8.4 (L) 8.9 - 10.3 mg/dL   Total Protein 8.5 (H) 6.5 - 8.1 g/dL   Albumin 3.9 3.5 - 5.0 g/dL   AST 24 15 - 41 U/L   ALT 21 0 - 44 U/L   Alkaline Phosphatase 66 38 - 126 U/L   Total Bilirubin 1.3 (H) 0.3 - 1.2 mg/dL   GFR, Estimated 58 (L) >60 mL/min    Comment: (NOTE) Calculated using the CKD-EPI Creatinine Equation (2021)    Anion gap 10 5 - 15    Comment: Performed at [facility], [address], [place], [place] [address]  Magnesium     Status: None   Collection Time: 12/14/21  5:55 AM  Result Value Ref Range   Magnesium 2.0 1.7 - 2.4 mg/dL    Comment: Performed at [facility], [address] [place],  [address]  Glucose, capillary     Status: Abnormal   Collection Time: 12/14/21  5:59 AM  Result Value Ref Range   Glucose-Capillary 136 (H) 70 - 99 mg/dL    Comment: Glucose reference range applies only to samples taken  after fasting for at least 8 hours.  Glucose, capillary     Status: Abnormal   Collection Time: 12/14/21  7:54 AM  Result Value Ref Range   Glucose-Capillary 114 (H) 70 - 99 mg/dL    Comment: Glucose reference range applies only to samples taken after fasting for at least 8 hours.   Comment 1 Notify RN    Comment 2 Document in Chart   Glucose, capillary     Status: Abnormal   Collection Time: 12/14/21 11:48 AM  Result Value Ref Range   Glucose-Capillary 114 (H) 70 - 99 mg/dL    Comment: Glucose reference range applies only to samples taken after fasting for at least 8 hours.   Comment 1 Notify RN    Comment 2 Document in Chart   Glucose, capillary     Status: Abnormal   Collection Time: 12/14/21  6:21 PM  Result Value Ref  Range   Glucose-Capillary 126 (H) 70 - 99 mg/dL    Comment: Glucose reference range applies only to samples taken after fasting for at least 8 hours.   Comment 1 Notify RN    Comment 2 Document in Chart     DG Chest Portable 1 View  Result Date: 12/14/2021 CLINICAL DATA:  Nasogastric tube placement. EXAM: PORTABLE CHEST 1 VIEW COMPARISON:  December 13, 2021 FINDINGS: A nasogastric tube is seen with its distal tip noted within the body of the stomach. Its distal side hole sits approximately 5.8 cm distal to the expected region of the gastroesophageal junction. The heart size and mediastinal contours are within normal limits. Low lung volumes are seen with mild, diffuse, chronic appearing increased interstitial lung markings. Mild atelectatic changes are seen within the bilateral lung bases. There is no evidence of a pleural effusion or pneumothorax. Multilevel degenerative changes are seen throughout the thoracic spine. IMPRESSION: 1. Nasogastric tube positioning, as described above. 2. Mild bibasilar atelectasis. Electronically Signed   By: Virgina Norfolk M.D.   On: 12/14/2021 03:37   CT ABDOMEN PELVIS W CONTRAST  Result Date: 12/14/2021 CLINICAL DATA:  Umbilical  hernia pain for the last few days with hernia enlargement EXAM: CT ABDOMEN AND PELVIS WITH CONTRAST TECHNIQUE: Multidetector CT imaging of the abdomen and pelvis was performed using the standard protocol following bolus administration of intravenous contrast. RADIATION DOSE REDUCTION: This exam was performed according to the departmental dose-optimization program which includes automated exposure control, adjustment of the mA and/or kV according to patient size and/or use of iterative reconstruction technique. CONTRAST:  158m OMNIPAQUE IOHEXOL 300 MG/ML  SOLN COMPARISON:  CT 03/26/2018 FINDINGS: Lower chest: Bronchial wall thickening and patchy airspace opacities in the bilateral lower lobes. Trace left pleural effusion or pleural thickening. Hepatobiliary: No focal liver abnormality is seen. No gallstones, gallbladder wall thickening, or biliary dilatation. Pancreas: Unremarkable. No pancreatic ductal dilatation or surrounding inflammatory changes. Spleen: Normal in size without focal abnormality. Adrenals/Urinary Tract: Unremarkable adrenal glands. Hypoattenuating lesions in both kidneys statistically likely to represent cysts. No follow-up recommended. Nonobstructing bilateral nephrolithiasis. No hydronephrosis. Unremarkable bladder. Stomach/Bowel: Diffuse dilation of the small bowel upstream from the herniated portion of small bowel in the periumbilical hernia. Findings compatible with small-bowel obstruction secondary to umbilical hernia. The small bowel within the hernia sac demonstrates wall thickening though appears to enhance normally. The small bowel and colon downstream from the umbilical hernia are decompressed. Normal appendix. Distended stomach. Vascular/Lymphatic: Aortic atherosclerosis. No enlarged abdominal or pelvic lymph nodes. Reproductive: Enlarged prostate. Other: Fat containing bilateral inguinal hernias. No free intraperitoneal fluid or air. Musculoskeletal: No acute or significant osseous  findings. IMPRESSION: High-grade small bowel obstruction secondary to herniation into the umbilical hernia. Bronchial wall thickening and infiltrates in the lower lobes compatible with infection and/or aspiration. Aortic Atherosclerosis (ICD10-I70.0). Electronically Signed   By: TPlacido SouM.D.   On: 12/14/2021 02:05   DG Chest Port 1 View  Result Date: 12/14/2021 CLINICAL DATA:  Questionable sepsis - evaluate for abnormality EXAM: PORTABLE CHEST 1 VIEW COMPARISON:  Radiograph 09/15/2019 FINDINGS: Mild elevation of left hemidiaphragm. Streaky opacities at the left lung base typical of atelectasis. Overall improved lung aeration from prior exam. Stable heart size and mediastinal contours. No pleural effusion or pneumothorax. No pulmonary edema. Stable osseous structures. IMPRESSION: Mild elevation of the left hemidiaphragm with streaky left basilar atelectasis. Electronically Signed   By: MKeith RakeM.D.   On: 12/14/2021 00:01    ROS:  Pertinent items are noted in HPI.  Blood pressure 114/73, pulse 96, temperature 99 F (37.2 C), temperature source Oral, resp. rate 17, height 6' (1.829 m), weight 106.8 kg, SpO2 95 %. Physical Exam: Obese male in no acute distress Head is normocephalic, atraumatic Lungs reveal some bilateral lower crackles. Heart examination reveals a regular rate and rhythm. Abdomen is rotund which is his baseline state.  He does have a 2 to 3 cm umbilical hernia that is easily reducible.  No rigidity is noted.  CT scan images personally reviewed  Assessment/Plan: Impression: Partial small bowel obstruction secondary to umbilical hernia.  The umbilical hernia is easily reducible.  No need for acute surgical invention at this time.  Given his recent pulmonary status, I would like this to stabilize prior to any surgical intervention which can be elective at the present time.  As he has had multiple attempts at NG tube placement, I would wait and see whether or not it is  indicated.  He is not currently having nausea or emesis.  I would keep him on sips of clears.  Will reevaluate in a.m. Would replenish his potassium to keep his level greater or equal to 4. Aviva Signs 12/14/2021, 7:08 PM

## 2021-12-14 NOTE — Assessment & Plan Note (Addendum)
-   Stable overall -Resume home hypoglycemic regimen -Modified carbohydrate diet discussed with patient.

## 2021-12-14 NOTE — Progress Notes (Addendum)
Pt says he was coughing and NG tube came out. This nurse attempted 4 times to insert NG tube. 4f tried in R nares x2. 120ftried x1 in L nares. Last attempt tried with a 1212fn R nares. MD made aware of all attempts, awaiting new orders. MD recommends getting general surgery involved, on call general surgery Dr. JenArnoldo Moraletified of issues at hand, MD jenkins says he will re evaluate pt in the morning and he may need a break from NG tube usage due to increased failed attempts and possible inflammation from it. Pt made aware and in agreeable, no c/o nausea at this time nor distress noted. Charge made aware.

## 2021-12-14 NOTE — ED Notes (Signed)
OG tube placement verified by Dr. Clearence Ped, suction set to low-intermittent

## 2021-12-14 NOTE — Assessment & Plan Note (Addendum)
-   Likely related to aspiration pneumonia. - Treated with Unasyn and subsequent transition to oral Augmentin to complete antibiotic therapy. --Legionella urine antigens without growth; negative blood cultures no growth on expectorated sputum. - COVID and flu negative - Continue to monitor -Not requiring oxygen currently. -Continue as needed bronchodilator.

## 2021-12-14 NOTE — Assessment & Plan Note (Addendum)
-  In the setting of GI losses and decreased oral intake -Repleted and within normal limits at discharge -Continue to follow electrolytes trend intermittently.

## 2021-12-14 NOTE — Assessment & Plan Note (Addendum)
-   The scan shows high-grade SBO secondary to umbilical hernia - Status post surgical repair of umbilical hernia and completely asymptomatic at discharge. -Patient tolerating diet and feeling ready to go home. -Outpatient follow-up with general surgery has been arranged -Continue as needed analgesic as prescribed. -Patient instructed to maintain adequate hydration.

## 2021-12-14 NOTE — TOC Progression Note (Signed)
  Transition of Care [facility]) Screening Note   Patient Details  Name: Jose Lamb Date of Birth: 21-May-1960   Transition of Care [facility]) CM/SW Contact:    Shade Flood, LCSW Phone Number: 12/14/2021, 11:12 AM    Transition of Care Department [facility]) has reviewed patient and no TOC needs have been identified at this time. We will continue to monitor patient advancement through interdisciplinary progression rounds. If new patient transition needs arise, please place a TOC consult.

## 2021-12-14 NOTE — Assessment & Plan Note (Addendum)
-  Resume the use of Xarelto.

## 2021-12-14 NOTE — Progress Notes (Signed)
Pharmacy Antibiotic Note  Zanden Colver is a 61 y.o. male admitted on 12/13/2021 with pneumonia.  Pharmacy has been consulted for Unasyn dosing. WBC WNL. Renal function good.   Plan: Unasyn 3g IV q8h Trend WBC, temp, renal function  F/U infectious work-up  Height: 6' (182.9 cm) Weight: 113.4 kg (250 lb) IBW/kg (Calculated) : 77.6  Temp (24hrs), Avg:101.2 F (38.4 C), Min:100.9 F (38.3 C), Max:101.4 F (38.6 C)  Recent Labs  Lab 12/14/21 0009 12/14/21 0223  WBC 4.0  --   CREATININE 1.05  --   LATICACIDVEN 1.3 1.8    Estimated Creatinine Clearance: 98.5 mL/min (by C-G formula based on SCr of 1.05 mg/dL).    Allergies  Allergen Reactions   Asa [Aspirin] Other (See Comments)    Nose bleeds    Narda Bonds, PharmD, [place] Clinical Pharmacist Phone: [phone]

## 2021-12-14 NOTE — Assessment & Plan Note (Addendum)
-  which Occurred after an episode of vomiting as an outpatient -Work-up demonstrating what appears to be aspiration pneumonia -Excellent response to antibiotic use; discharge on oral Augmentin to complete therapy. -No requiring oxygen supplementation at time of discharge. -Continue the use of flutter valve and as needed bronchodilator management.

## 2021-12-14 NOTE — Progress Notes (Signed)
Glenard Haring, RN notified regarding NG output of 550 ml at this time. Output remains in suction canister until RN assesses it, RN notified of previous NG output of 900 ml at 0750, pt reports last UO last night, bladder scan to be completed

## 2021-12-14 NOTE — ED Notes (Signed)
This nurse observed pt coughing and throwing up in room. Pt went in to assess pt and oxygen sats were in the low 80s. This RN placed pt on [place] 4L and respiratory and EDP have been called.

## 2021-12-15 DIAGNOSIS — K420 Umbilical hernia with obstruction, without gangrene: Secondary | ICD-10-CM

## 2021-12-15 DIAGNOSIS — J9601 Acute respiratory failure with hypoxia: Secondary | ICD-10-CM

## 2021-12-15 DIAGNOSIS — J690 Pneumonitis due to inhalation of food and vomit: Secondary | ICD-10-CM | POA: Diagnosis not present

## 2021-12-15 DIAGNOSIS — E669 Obesity, unspecified: Secondary | ICD-10-CM

## 2021-12-15 DIAGNOSIS — E876 Hypokalemia: Secondary | ICD-10-CM

## 2021-12-15 DIAGNOSIS — K56609 Unspecified intestinal obstruction, unspecified as to partial versus complete obstruction: Secondary | ICD-10-CM | POA: Diagnosis not present

## 2021-12-15 LAB — CBC
HCT: 38.6 % — ABNORMAL LOW (ref 39.0–52.0)
Hemoglobin: 12.7 g/dL — ABNORMAL LOW (ref 13.0–17.0)
MCH: 29.2 pg (ref 26.0–34.0)
MCHC: 32.9 g/dL (ref 30.0–36.0)
MCV: 88.7 fL (ref 80.0–100.0)
Platelets: 122 10*3/uL — ABNORMAL LOW (ref 150–400)
RBC: 4.35 MIL/uL (ref 4.22–5.81)
RDW: 13.2 % (ref 11.5–15.5)
WBC: 3.4 10*3/uL — ABNORMAL LOW (ref 4.0–10.5)
nRBC: 0 % (ref 0.0–0.2)

## 2021-12-15 LAB — BASIC METABOLIC PANEL
Anion gap: 11 (ref 5–15)
BUN: 33 mg/dL — ABNORMAL HIGH (ref 6–20)
CO2: 26 mmol/L (ref 22–32)
Calcium: 7.8 mg/dL — ABNORMAL LOW (ref 8.9–10.3)
Chloride: 99 mmol/L (ref 98–111)
Creatinine, Ser: 1.24 mg/dL (ref 0.61–1.24)
GFR, Estimated: >60 mL/min (ref >60)
Glucose, Bld: 122 mg/dL — ABNORMAL HIGH (ref 70–99)
Potassium: 3.5 mmol/L (ref 3.5–5.1)
Sodium: 136 mmol/L (ref 135–145)

## 2021-12-15 LAB — GLUCOSE, CAPILLARY
Glucose-Capillary: 102 mg/dL — ABNORMAL HIGH (ref 70–99)
Glucose-Capillary: 104 mg/dL — ABNORMAL HIGH (ref 70–99)
Glucose-Capillary: 109 mg/dL — ABNORMAL HIGH (ref 70–99)
Glucose-Capillary: 121 mg/dL — ABNORMAL HIGH (ref 70–99)

## 2021-12-15 LAB — SURGICAL PCR SCREEN
MRSA, PCR: NEGATIVE
Staphylococcus aureus: NEGATIVE

## 2021-12-15 LAB — MAGNESIUM: Magnesium: 2.2 mg/dL (ref 1.7–2.4)

## 2021-12-15 MED ORDER — CHLORHEXIDINE GLUCONATE CLOTH 2 % EX PADS
6.0000 | MEDICATED_PAD | Freq: Once | CUTANEOUS | Status: AC
  Administered 2021-12-16: 6 via TOPICAL

## 2021-12-15 MED ORDER — CHLORHEXIDINE GLUCONATE CLOTH 2 % EX PADS
6.0000 | MEDICATED_PAD | Freq: Once | CUTANEOUS | Status: AC
  Administered 2021-12-15: 6 via TOPICAL

## 2021-12-15 NOTE — Progress Notes (Addendum)
Progress Note   Patient: Jose Lamb DDU:[id number] DOB: [birth year] DOA: 12/13/2021     1 DOS: the patient was seen and examined on 12/15/2021   Brief hospital course: As per H&P written by Dr. Clearence Ped on 12/14/21 Jose Lamb is a 61 y.o. male with medical history significant of atrial fibrillation, pulmonary embolism, essential hypertension duodenal ulcer, long COVID, and more presents to the ED complain of abdominal pain.  Patient reports he has had abdominal pain for 4 days.  His abdomen has been swollen since then 2.  The pain is described as a pressure that is constant.  He has had diarrhea 4-5 times per day that is nonbloody.  Patient denies having melena as well.  At first having a bowel movement would relieve the pressure, but as the pain progressed the bowel movements no longer relieved the pain.  Patient reports that he has felt feverish and chilled at home without a measured fever.  In the ER his initial temperature was 100.9 and it went up after Tylenol.  Patient reports that he has been vomiting 3-4 times per day which is also nonbloody.  It appears chocolate brown-colored.  Patient reports has been having to sleep sitting up because his pain is worse when he lays flat.  He has had choking spells and coughing spells after vomiting.  Patient reports he has been coughing for couple weeks anyways.  He went to an outpatient provider who prescribed Tessalon Perles, steroid, albuterol.  It helped initially, but then his symptoms returned.  He is still coughing and reports it is productive of white mucus.  He denies chest pain but admits to shortness of breath.  He has been using his nebulizer 3 times per day.  Patient has no other complaints at this time.   Assessment and Plan: * SBO (small bowel obstruction) (HCC) - The scan shows high-grade SBO secondary to umbilical hernia - General surgery actively following patient with intentions for surgical correction on 12/16/2021 -Allowing sips  of clears and ice chips.  N.p.o. after midnight. - Supportive care, adequate hydration and electrolyte repletion. -Patient lost NG tube on 12/14/2021 evening; currently no vomiting.  Holding on replacement.  GERD (gastroesophageal reflux disease) - Continue PPI.  Diabetes mellitus type 2 in obese (HCC) - Continue sliding scale coverage - Continue to follow CBGs and further adjust hypoglycemic regimen.  CAP (community acquired pneumonia) - Likely related to aspiration pneumonia. - Continue Unasyn --Legionella urine antigens - Expectorated sputum culture - Follow-up blood culture results. - COVID and flu negative - Continue to monitor -Not requiring oxygen currently. -Continue as needed bronchodilator.  Hypokalemia -In the setting of GI losses and decreased oral intake -Continue electrolyte repletion and follow trend.  History of pulmonary embolus (PE) -Continue holding xarelto in anticipation for surgery. -Continue heparin for DVT prophylaxis.  Acute respiratory failure with hypoxia (HCC) - Occurred after an episode of vomiting as an outpatient -Work-up demonstrating what appears to be aspiration pneumonia -Continue current antibiotic -Continue the use of flutter valve/incentive spirometer -No oxygen required on today's evaluation -Continue as needed bronchodilators.  Class I obesity -Low calorie diet and portion control discussed with patient. -Body mass index is 31.93 kg/m.     Subjective: [name] Reporting abdominal pain, intermittent nausea and hiccups.  No fever, no shortness of breath, no vomiting.  Physical Exam: Vitals:   12/14/21 1308 12/14/21 1600 12/14/21 2020 12/15/21 0517  BP: (!) 119/90 114/73 105/69 116/80  Pulse: 96 96 95 91  Resp: 18 17 (!)  22 20  Temp: 98.7 F (37.1 C) 99 F (37.2 C) (!) 97.2 F (36.2 C) 98.3 F (36.8 C)  TempSrc: Oral Oral    SpO2: 96% 95% 97% 93%  Weight:      Height:       General exam: Alert, awake, oriented x 3;  reporting abdominal distention, intermittent nausea and hiccups.  No vomiting currently.  Patient is afebrile.  Good saturation on room air. Respiratory system: No using accessory muscles; no crackles, positive rhonchi. Cardiovascular system:RRR. No rubs or gallops; no JVD. Gastrointestinal system: Abdomen is distended; mildly tense and without appreciated bowel sounds on examination.   Central nervous system: Alert and oriented. No focal neurological deficits. Extremities: No cyanosis or clubbing; trace edema appreciated bilaterally. Skin: No petechiae. Psychiatry: Judgement and insight appear normal. Mood & affect appropriate.   Data Reviewed: Basic metabolic panel: Sodium 382, potassium 3.5, chloride 99, bicarb 26, creatinine 1.24, BUN 33 and GFR more than 60. CBC: WBCs 3.4, hemoglobin 12.7 platelet count 122 K Magnesium: 2.2 CBGs: 104-120.  Family Communication: Wife at bedside.  Disposition: Status is: Inpatient Remains inpatient appropriate because: Continue treatment for SBO and acute pneumonia.   Planned Discharge Destination: Home  Time spent: 35 minutes  Author: Barton Dubois, MD 12/15/2021 5:10 PM  For on call review [email or web address].

## 2021-12-15 NOTE — H&P (View-Only) (Signed)
Subjective: Patient still with periumbilical pain, nausea, and vomiting this afternoon.  No shortness of breath.  Objective: Vital signs in last 24 hours: Temp:  [97.2 F (36.2 C)-98.3 F (36.8 C)] 98.3 F (36.8 C) (11/09 0517) Pulse Rate:  [91-95] 91 (11/09 0517) Resp:  [20-22] 20 (11/09 0517) BP: (105-116)/(69-80) 116/80 (11/09 0517) SpO2:  [93 %-97 %] 93 % (11/09 0517) Last BM Date : 12/13/21  Intake/Output from previous day: 11/08 0701 - 11/09 0700 In: 818.6 [I.V.:616.6; IV Piggyback:202] Out: 1800 [Urine:300; Emesis/NG output:1500] Intake/Output this shift: No intake/output data recorded.  General appearance: alert, cooperative, and no distress GI: Soft, mildly distended.  Protruding umbilical hernia that is reducible but immediately pops back out.  Lab Results:  Recent Labs    12/14/21 0009 12/15/21 0353  WBC 4.0 3.4*  HGB 13.2 12.7*  HCT 40.1 38.6*  PLT 129* 122*   BMET Recent Labs    12/14/21 0555 12/15/21 0353  NA 135 136  K 3.0* 3.5  CL 99 99  CO2 26 26  GLUCOSE 133* 122*  BUN 23* 33*  CREATININE 1.39* 1.24  CALCIUM 8.4* 7.8*   PT/INR Recent Labs    12/14/21 0009  LABPROT 18.0*  INR 1.5*    Studies/Results: DG Chest Portable 1 View  Result Date: 12/14/2021 CLINICAL DATA:  Nasogastric tube placement. EXAM: PORTABLE CHEST 1 VIEW COMPARISON:  December 13, 2021 FINDINGS: A nasogastric tube is seen with its distal tip noted within the body of the stomach. Its distal side hole sits approximately 5.8 cm distal to the expected region of the gastroesophageal junction. The heart size and mediastinal contours are within normal limits. Low lung volumes are seen with mild, diffuse, chronic appearing increased interstitial lung markings. Mild atelectatic changes are seen within the bilateral lung bases. There is no evidence of a pleural effusion or pneumothorax. Multilevel degenerative changes are seen throughout the thoracic spine. IMPRESSION: 1.  Nasogastric tube positioning, as described above. 2. Mild bibasilar atelectasis. Electronically Signed   By: Virgina Norfolk M.D.   On: 12/14/2021 03:37   CT ABDOMEN PELVIS W CONTRAST  Result Date: 12/14/2021 CLINICAL DATA:  Umbilical hernia pain for the last few days with hernia enlargement EXAM: CT ABDOMEN AND PELVIS WITH CONTRAST TECHNIQUE: Multidetector CT imaging of the abdomen and pelvis was performed using the standard protocol following bolus administration of intravenous contrast. RADIATION DOSE REDUCTION: This exam was performed according to the departmental dose-optimization program which includes automated exposure control, adjustment of the mA and/or kV according to patient size and/or use of iterative reconstruction technique. CONTRAST:  136m OMNIPAQUE IOHEXOL 300 MG/ML  SOLN COMPARISON:  CT 03/26/2018 FINDINGS: Lower chest: Bronchial wall thickening and patchy airspace opacities in the bilateral lower lobes. Trace left pleural effusion or pleural thickening. Hepatobiliary: No focal liver abnormality is seen. No gallstones, gallbladder wall thickening, or biliary dilatation. Pancreas: Unremarkable. No pancreatic ductal dilatation or surrounding inflammatory changes. Spleen: Normal in size without focal abnormality. Adrenals/Urinary Tract: Unremarkable adrenal glands. Hypoattenuating lesions in both kidneys statistically likely to represent cysts. No follow-up recommended. Nonobstructing bilateral nephrolithiasis. No hydronephrosis. Unremarkable bladder. Stomach/Bowel: Diffuse dilation of the small bowel upstream from the herniated portion of small bowel in the periumbilical hernia. Findings compatible with small-bowel obstruction secondary to umbilical hernia. The small bowel within the hernia sac demonstrates wall thickening though appears to enhance normally. The small bowel and colon downstream from the umbilical hernia are decompressed. Normal appendix. Distended stomach. Vascular/Lymphatic:  Aortic atherosclerosis. No enlarged abdominal or  pelvic lymph nodes. Reproductive: Enlarged prostate. Other: Fat containing bilateral inguinal hernias. No free intraperitoneal fluid or air. Musculoskeletal: No acute or significant osseous findings. IMPRESSION: High-grade small bowel obstruction secondary to herniation into the umbilical hernia. Bronchial wall thickening and infiltrates in the lower lobes compatible with infection and/or aspiration. Aortic Atherosclerosis (ICD10-I70.0). Electronically Signed   By: Placido Sou M.D.   On: 12/14/2021 02:05   DG Chest Port 1 View  Result Date: 12/14/2021 CLINICAL DATA:  Questionable sepsis - evaluate for abnormality EXAM: PORTABLE CHEST 1 VIEW COMPARISON:  Radiograph 09/15/2019 FINDINGS: Mild elevation of left hemidiaphragm. Streaky opacities at the left lung base typical of atelectasis. Overall improved lung aeration from prior exam. Stable heart size and mediastinal contours. No pleural effusion or pneumothorax. No pulmonary edema. Stable osseous structures. IMPRESSION: Mild elevation of the left hemidiaphragm with streaky left basilar atelectasis. Electronically Signed   By: Keith Rake M.D.   On: 12/14/2021 00:01    Anti-infectives: Anti-infectives (From admission, onward)    Start     Dose/Rate Route Frequency Ordered Stop   12/14/21 0400  Ampicillin-Sulbactam (UNASYN) 3 g in sodium chloride 0.9 % 100 mL IVPB        3 g 200 mL/hr over 30 Minutes Intravenous Every 8 hours 12/14/21 0354         Impression: Partial small bowel obstruction secondary to umbilical hernia.  As he is not improving, I think we need to proceed with an umbilical herniorrhaphy with mesh.  The risks and benefits of the procedure including bleeding, infection, mesh use, and the possibility of recurrence of the hernia were fully explained to the patient, who gave informed consent.  He does realize he is it at mild increased risk for pulmonary difficulty secondary to  endotracheal intubation.  LOS: 1 day    Aviva Signs 12/15/2021

## 2021-12-15 NOTE — Progress Notes (Signed)
Patient did not sleep much throughout shift, was up multiple times in bathroom. He did have multiple loose bowel movements. Patient complained of pain 9 out of 10 in left lower abd, PRN pain meds given to manage pain. Patient denies discomfort at this time.

## 2021-12-15 NOTE — Progress Notes (Signed)
Subjective: Patient still with periumbilical pain, nausea, and vomiting this afternoon.  No shortness of breath.  Objective: Vital signs in last 24 hours: Temp:  [97.2 F (36.2 C)-98.3 F (36.8 C)] 98.3 F (36.8 C) (11/09 0517) Pulse Rate:  [91-95] 91 (11/09 0517) Resp:  [20-22] 20 (11/09 0517) BP: (105-116)/(69-80) 116/80 (11/09 0517) SpO2:  [93 %-97 %] 93 % (11/09 0517) Last BM Date : 12/13/21  Intake/Output from previous day: 11/08 0701 - 11/09 0700 In: 818.6 [I.V.:616.6; IV Piggyback:202] Out: 1800 [Urine:300; Emesis/NG output:1500] Intake/Output this shift: No intake/output data recorded.  General appearance: alert, cooperative, and no distress GI: Soft, mildly distended.  Protruding umbilical hernia that is reducible but immediately pops back out.  Lab Results:  Recent Labs    12/14/21 0009 12/15/21 0353  WBC 4.0 3.4*  HGB 13.2 12.7*  HCT 40.1 38.6*  PLT 129* 122*   BMET Recent Labs    12/14/21 0555 12/15/21 0353  NA 135 136  K 3.0* 3.5  CL 99 99  CO2 26 26  GLUCOSE 133* 122*  BUN 23* 33*  CREATININE 1.39* 1.24  CALCIUM 8.4* 7.8*   PT/INR Recent Labs    12/14/21 0009  LABPROT 18.0*  INR 1.5*    Studies/Results: DG Chest Portable 1 View  Result Date: 12/14/2021 CLINICAL DATA:  Nasogastric tube placement. EXAM: PORTABLE CHEST 1 VIEW COMPARISON:  December 13, 2021 FINDINGS: A nasogastric tube is seen with its distal tip noted within the body of the stomach. Its distal side hole sits approximately 5.8 cm distal to the expected region of the gastroesophageal junction. The heart size and mediastinal contours are within normal limits. Low lung volumes are seen with mild, diffuse, chronic appearing increased interstitial lung markings. Mild atelectatic changes are seen within the bilateral lung bases. There is no evidence of a pleural effusion or pneumothorax. Multilevel degenerative changes are seen throughout the thoracic spine. IMPRESSION: 1.  Nasogastric tube positioning, as described above. 2. Mild bibasilar atelectasis. Electronically Signed   By: Virgina Norfolk M.D.   On: 12/14/2021 03:37   CT ABDOMEN PELVIS W CONTRAST  Result Date: 12/14/2021 CLINICAL DATA:  Umbilical hernia pain for the last few days with hernia enlargement EXAM: CT ABDOMEN AND PELVIS WITH CONTRAST TECHNIQUE: Multidetector CT imaging of the abdomen and pelvis was performed using the standard protocol following bolus administration of intravenous contrast. RADIATION DOSE REDUCTION: This exam was performed according to the departmental dose-optimization program which includes automated exposure control, adjustment of the mA and/or kV according to patient size and/or use of iterative reconstruction technique. CONTRAST:  161m OMNIPAQUE IOHEXOL 300 MG/ML  SOLN COMPARISON:  CT 03/26/2018 FINDINGS: Lower chest: Bronchial wall thickening and patchy airspace opacities in the bilateral lower lobes. Trace left pleural effusion or pleural thickening. Hepatobiliary: No focal liver abnormality is seen. No gallstones, gallbladder wall thickening, or biliary dilatation. Pancreas: Unremarkable. No pancreatic ductal dilatation or surrounding inflammatory changes. Spleen: Normal in size without focal abnormality. Adrenals/Urinary Tract: Unremarkable adrenal glands. Hypoattenuating lesions in both kidneys statistically likely to represent cysts. No follow-up recommended. Nonobstructing bilateral nephrolithiasis. No hydronephrosis. Unremarkable bladder. Stomach/Bowel: Diffuse dilation of the small bowel upstream from the herniated portion of small bowel in the periumbilical hernia. Findings compatible with small-bowel obstruction secondary to umbilical hernia. The small bowel within the hernia sac demonstrates wall thickening though appears to enhance normally. The small bowel and colon downstream from the umbilical hernia are decompressed. Normal appendix. Distended stomach. Vascular/Lymphatic:  Aortic atherosclerosis. No enlarged abdominal or  pelvic lymph nodes. Reproductive: Enlarged prostate. Other: Fat containing bilateral inguinal hernias. No free intraperitoneal fluid or air. Musculoskeletal: No acute or significant osseous findings. IMPRESSION: High-grade small bowel obstruction secondary to herniation into the umbilical hernia. Bronchial wall thickening and infiltrates in the lower lobes compatible with infection and/or aspiration. Aortic Atherosclerosis (ICD10-I70.0). Electronically Signed   By: Placido Sou M.D.   On: 12/14/2021 02:05   DG Chest Port 1 View  Result Date: 12/14/2021 CLINICAL DATA:  Questionable sepsis - evaluate for abnormality EXAM: PORTABLE CHEST 1 VIEW COMPARISON:  Radiograph 09/15/2019 FINDINGS: Mild elevation of left hemidiaphragm. Streaky opacities at the left lung base typical of atelectasis. Overall improved lung aeration from prior exam. Stable heart size and mediastinal contours. No pleural effusion or pneumothorax. No pulmonary edema. Stable osseous structures. IMPRESSION: Mild elevation of the left hemidiaphragm with streaky left basilar atelectasis. Electronically Signed   By: Keith Rake M.D.   On: 12/14/2021 00:01    Anti-infectives: Anti-infectives (From admission, onward)    Start     Dose/Rate Route Frequency Ordered Stop   12/14/21 0400  Ampicillin-Sulbactam (UNASYN) 3 g in sodium chloride 0.9 % 100 mL IVPB        3 g 200 mL/hr over 30 Minutes Intravenous Every 8 hours 12/14/21 0354         Impression: Partial small bowel obstruction secondary to umbilical hernia.  As he is not improving, I think we need to proceed with an umbilical herniorrhaphy with mesh.  The risks and benefits of the procedure including bleeding, infection, mesh use, and the possibility of recurrence of the hernia were fully explained to the patient, who gave informed consent.  He does realize he is it at mild increased risk for pulmonary difficulty secondary to  endotracheal intubation.  LOS: 1 day    Aviva Signs 12/15/2021

## 2021-12-16 ENCOUNTER — Inpatient Hospital Stay (HOSPITAL_COMMUNITY): Payer: 59 | Admitting: Anesthesiology

## 2021-12-16 ENCOUNTER — Encounter (HOSPITAL_COMMUNITY)
Admission: EM | Disposition: A | Discharge status: Home / Self Care | Payer: Self-pay | Source: Home / Self Care | Attending: Internal Medicine

## 2021-12-16 ENCOUNTER — Other Ambulatory Visit: Payer: Self-pay

## 2021-12-16 ENCOUNTER — Encounter (HOSPITAL_COMMUNITY): Payer: Self-pay | Admitting: Family Medicine

## 2021-12-16 DIAGNOSIS — I4891 Unspecified atrial fibrillation: Secondary | ICD-10-CM

## 2021-12-16 DIAGNOSIS — K429 Umbilical hernia without obstruction or gangrene: Secondary | ICD-10-CM

## 2021-12-16 DIAGNOSIS — K56600 Partial intestinal obstruction, unspecified as to cause: Secondary | ICD-10-CM

## 2021-12-16 DIAGNOSIS — I10 Essential (primary) hypertension: Secondary | ICD-10-CM

## 2021-12-16 HISTORY — PX: UMBILICAL HERNIA REPAIR: SHX196

## 2021-12-16 LAB — GLUCOSE, CAPILLARY
Glucose-Capillary: 118 mg/dL — ABNORMAL HIGH (ref 70–99)
Glucose-Capillary: 145 mg/dL — ABNORMAL HIGH (ref 70–99)
Glucose-Capillary: 81 mg/dL (ref 70–99)
Glucose-Capillary: 82 mg/dL (ref 70–99)
Glucose-Capillary: 93 mg/dL (ref 70–99)

## 2021-12-16 LAB — CBC
HCT: 39.4 % (ref 39.0–52.0)
Hemoglobin: 12.6 g/dL — ABNORMAL LOW (ref 13.0–17.0)
MCH: 29.1 pg (ref 26.0–34.0)
MCHC: 32 g/dL (ref 30.0–36.0)
MCV: 91 fL (ref 80.0–100.0)
Platelets: 124 10*3/uL — ABNORMAL LOW (ref 150–400)
RBC: 4.33 MIL/uL (ref 4.22–5.81)
RDW: 13.2 % (ref 11.5–15.5)
WBC: 3 10*3/uL — ABNORMAL LOW (ref 4.0–10.5)
nRBC: 0 % (ref 0.0–0.2)

## 2021-12-16 LAB — BASIC METABOLIC PANEL
Anion gap: 8 (ref 5–15)
BUN: 26 mg/dL — ABNORMAL HIGH (ref 6–20)
CO2: 27 mmol/L (ref 22–32)
Calcium: 7.7 mg/dL — ABNORMAL LOW (ref 8.9–10.3)
Chloride: 103 mmol/L (ref 98–111)
Creatinine, Ser: 0.91 mg/dL (ref 0.61–1.24)
GFR, Estimated: >60 mL/min (ref >60)
Glucose, Bld: 101 mg/dL — ABNORMAL HIGH (ref 70–99)
Potassium: 4.1 mmol/L (ref 3.5–5.1)
Sodium: 138 mmol/L (ref 135–145)

## 2021-12-16 LAB — MAGNESIUM: Magnesium: 2.5 mg/dL — ABNORMAL HIGH (ref 1.7–2.4)

## 2021-12-16 SURGERY — REPAIR, HERNIA, UMBILICAL, ADULT
Anesthesia: General | Site: Abdomen

## 2021-12-16 MED ORDER — HYDROMORPHONE HCL 1 MG/ML IJ SOLN
0.2500 mg | INTRAMUSCULAR | Status: DC | PRN
  Administered 2021-12-16: 0.5 mg via INTRAVENOUS
  Filled 2021-12-16: qty 0.5

## 2021-12-16 MED ORDER — SIMETHICONE 80 MG PO CHEW: 40.0000 mg | CHEWABLE_TABLET | Freq: Four times a day (QID) | ORAL | Status: DC | PRN

## 2021-12-16 MED ORDER — SODIUM CHLORIDE 0.9 % IR SOLN
Status: DC | PRN
  Administered 2021-12-16: 1000 mL

## 2021-12-16 MED ORDER — FENTANYL CITRATE (PF) 100 MCG/2ML IJ SOLN
INTRAMUSCULAR | Status: DC | PRN
  Administered 2021-12-16: 100 ug via INTRAVENOUS

## 2021-12-16 MED ORDER — HYDROMORPHONE HCL 1 MG/ML IJ SOLN: 1.0000 mg | INTRAMUSCULAR | Status: DC | PRN

## 2021-12-16 MED ORDER — CHLORHEXIDINE GLUCONATE 0.12 % MT SOLN
OROMUCOSAL | Status: AC
  Administered 2021-12-16: 15 mL via OROMUCOSAL
  Filled 2021-12-16: qty 15

## 2021-12-16 MED ORDER — BUPIVACAINE LIPOSOME 1.3 % IJ SUSP
INTRAMUSCULAR | Status: DC | PRN
  Administered 2021-12-16: 13 mL

## 2021-12-16 MED ORDER — ORAL CARE MOUTH RINSE: 15.0000 mL | Freq: Once | OROMUCOSAL | Status: AC

## 2021-12-16 MED ORDER — PROPOFOL 10 MG/ML IV BOLUS
INTRAVENOUS | Status: DC | PRN
  Administered 2021-12-16: 200 mg via INTRAVENOUS

## 2021-12-16 MED ORDER — MIDAZOLAM HCL 2 MG/2ML IJ SOLN
INTRAMUSCULAR | Status: AC
  Filled 2021-12-16: qty 2

## 2021-12-16 MED ORDER — OXYCODONE HCL 5 MG PO TABS
5.0000 mg | ORAL_TABLET | ORAL | Status: DC | PRN
  Administered 2021-12-17 (×2): 10 mg via ORAL
  Filled 2021-12-16 (×4): qty 2

## 2021-12-16 MED ORDER — FENTANYL CITRATE (PF) 250 MCG/5ML IJ SOLN
INTRAMUSCULAR | Status: AC
  Filled 2021-12-16: qty 5

## 2021-12-16 MED ORDER — LIDOCAINE HCL (CARDIAC) PF 100 MG/5ML IV SOSY
PREFILLED_SYRINGE | INTRAVENOUS | Status: DC | PRN
  Administered 2021-12-16: 100 mg via INTRAVENOUS

## 2021-12-16 MED ORDER — CHLORHEXIDINE GLUCONATE 0.12 % MT SOLN: 15.0000 mL | Freq: Once | OROMUCOSAL | Status: AC

## 2021-12-16 MED ORDER — BUPIVACAINE LIPOSOME 1.3 % IJ SUSP
INTRAMUSCULAR | Status: AC
  Filled 2021-12-16: qty 20

## 2021-12-16 MED ORDER — ONDANSETRON HCL 4 MG/2ML IJ SOLN
INTRAMUSCULAR | Status: DC | PRN
  Administered 2021-12-16: 4 mg via INTRAVENOUS

## 2021-12-16 MED ORDER — LACTATED RINGERS IV SOLN: INTRAVENOUS | Status: DC

## 2021-12-16 MED ORDER — SUGAMMADEX SODIUM 500 MG/5ML IV SOLN
INTRAVENOUS | Status: DC | PRN
  Administered 2021-12-16: 500 mg via INTRAVENOUS

## 2021-12-16 MED ORDER — MEPERIDINE HCL 50 MG/ML IJ SOLN: 6.2500 mg | INTRAMUSCULAR | Status: DC | PRN

## 2021-12-16 MED ORDER — SODIUM CHLORIDE 0.9 % IV SOLN
3.0000 g | Freq: Four times a day (QID) | INTRAVENOUS | Status: DC
  Administered 2021-12-16 – 2021-12-17 (×5): 3 g via INTRAVENOUS
  Filled 2021-12-16 (×5): qty 8

## 2021-12-16 MED ORDER — DEXAMETHASONE SODIUM PHOSPHATE 10 MG/ML IJ SOLN
INTRAMUSCULAR | Status: DC | PRN
  Administered 2021-12-16: 10 mg via INTRAVENOUS

## 2021-12-16 MED ORDER — PROPOFOL 10 MG/ML IV BOLUS
INTRAVENOUS | Status: AC
  Filled 2021-12-16: qty 20

## 2021-12-16 MED ORDER — MIDAZOLAM HCL 5 MG/5ML IJ SOLN
INTRAMUSCULAR | Status: DC | PRN
  Administered 2021-12-16: 2 mg via INTRAVENOUS

## 2021-12-16 MED ORDER — SUCCINYLCHOLINE CHLORIDE 200 MG/10ML IV SOSY
PREFILLED_SYRINGE | INTRAVENOUS | Status: DC | PRN
  Administered 2021-12-16: 160 mg via INTRAVENOUS

## 2021-12-16 MED ORDER — ROCURONIUM BROMIDE 100 MG/10ML IV SOLN
INTRAVENOUS | Status: DC | PRN
  Administered 2021-12-16: 60 mg via INTRAVENOUS

## 2021-12-16 SURGICAL SUPPLY — 29 items
BLADE SURG SZ11 CARB STEEL (BLADE) ×1 IMPLANT
CHLORAPREP W/TINT 26 (MISCELLANEOUS) ×1 IMPLANT
CLOTH BEACON ORANGE TIMEOUT ST (SAFETY) ×1 IMPLANT
COVER LIGHT HANDLE STERIS (MISCELLANEOUS) ×2 IMPLANT
DERMABOND ADVANCED .7 DNX12 (GAUZE/BANDAGES/DRESSINGS) ×1 IMPLANT
ELECT REM PT RETURN 9FT ADLT (ELECTROSURGICAL) ×1
ELECTRODE REM PT RTRN 9FT ADLT (ELECTROSURGICAL) ×1 IMPLANT
GLOVE BIOGEL PI IND STRL 7.0 (GLOVE) ×2 IMPLANT
GLOVE ECLIPSE 6.5 STRL STRAW (GLOVE) IMPLANT
GLOVE SURG SS PI 7.5 STRL IVOR (GLOVE) ×2 IMPLANT
GOWN STRL REUS W/TWL LRG LVL3 (GOWN DISPOSABLE) ×2 IMPLANT
INST SET MINOR GENERAL (KITS) ×1 IMPLANT
KIT TURNOVER KIT A (KITS) ×1 IMPLANT
LIGASURE IMPACT 36 18CM CVD LR (INSTRUMENTS) IMPLANT
MANIFOLD NEPTUNE II (INSTRUMENTS) ×1 IMPLANT
MESH VENTRALEX ST 8CM LRG (Mesh General) IMPLANT
NDL HYPO 21X1.5 SAFETY (NEEDLE) ×1 IMPLANT
NEEDLE HYPO 21X1.5 SAFETY (NEEDLE) ×1 IMPLANT
NS IRRIG 1000ML POUR BTL (IV SOLUTION) ×1 IMPLANT
PACK MINOR (CUSTOM PROCEDURE TRAY) ×1 IMPLANT
PAD ARMBOARD 7.5X6 YLW CONV (MISCELLANEOUS) ×1 IMPLANT
SET BASIN LINEN APH (SET/KITS/TRAYS/PACK) ×1 IMPLANT
SPONGE GAUZE 2X2 8PLY STRL LF (GAUZE/BANDAGES/DRESSINGS) IMPLANT
SUT ETHIBOND NAB MO 7 #0 18IN (SUTURE) ×1 IMPLANT
SUT MNCRL AB 4-0 PS2 18 (SUTURE) ×1 IMPLANT
SUT VIC AB 2-0 CT2 27 (SUTURE) ×1 IMPLANT
SUT VIC AB 3-0 SH 27 (SUTURE) ×1
SUT VIC AB 3-0 SH 27X BRD (SUTURE) ×1 IMPLANT
SYR 20ML LL LF (SYRINGE) ×2 IMPLANT

## 2021-12-16 NOTE — Interval H&P Note (Signed)
History and Physical Interval Note:  12/16/2021 1:01 PM  Jose Lamb  has presented today for surgery, with the diagnosis of umbilical hernia.  The various methods of treatment have been discussed with the patient and family. After consideration of risks, benefits and other options for treatment, the patient has consented to  Procedure(s): HERNIA REPAIR UMBILICAL ADULT WITH MESH (N/A) as a surgical intervention.  The patient's history has been reviewed, patient examined, no change in status, stable for surgery.  I have reviewed the patient's chart and labs.  Questions were answered to the patient's satisfaction.     Aviva Signs

## 2021-12-16 NOTE — Anesthesia Preprocedure Evaluation (Addendum)
Anesthesia Evaluation  Patient identified by MRN, date of birth, ID band Patient awake    Reviewed: Allergy & Precautions, H&P , NPO status , Patient's Chart, lab work & pertinent test results  History of Anesthesia Complications Negative for: history of anesthetic complications  Airway Mallampati: III  TM Distance: >3 FB Neck ROM: Full    Dental  (+) Missing, Dental Advisory Given   Pulmonary asthma , pneumonia, Patient abstained from smoking., PE   breath sounds clear to auscultation       Cardiovascular Exercise Tolerance: Good hypertension, Pt. on medications Normal cardiovascular exam+ dysrhythmias Atrial Fibrillation  Rhythm:Regular Rate:Normal  1. Left ventricular ejection fraction, by estimation, is 60 to 65%. The  left ventricle has normal function. The left ventricle has no regional  wall motion abnormalities. There is mild left ventricular hypertrophy.   2. Right ventricular systolic function is normal. The right ventricular  size is normal. Tricuspid regurgitation signal is inadequate for assessing  PA pressure.   3. The mitral valve is grossly normal. Trivial mitral valve  regurgitation.   4. The aortic valve is tricuspid. Aortic valve regurgitation is not  visualized.   5. The inferior vena cava is normal in size with greater than 50%  respiratory variability, suggesting right atrial pressure of 3 mmHg.      Neuro/Psych negative neurological ROS  negative psych ROS   GI/Hepatic Neg liver ROS, PUD,GERD  Medicated and Controlled,,  Endo/Other  diabetes, Well Controlled, Type 2, Oral Hypoglycemic Agents, Insulin Dependent    Renal/GU negative Renal ROS  negative genitourinary   Musculoskeletal negative musculoskeletal ROS (+)    Abdominal   Peds negative pediatric ROS (+)  Hematology  (+) HIV  Anesthesia Other Findings Coughing    Reproductive/Obstetrics negative OB ROS                              Anesthesia Physical Anesthesia Plan  ASA: 3  Anesthesia Plan: General   Post-op Pain Management: Dilaudid IV   Induction: Intravenous, Rapid sequence and Cricoid pressure planned  PONV Risk Score and Plan: 3 and Ondansetron and Dexamethasone  Airway Management Planned: Oral ETT  Additional Equipment:   Intra-op Plan:   Post-operative Plan: Extubation in OR  Informed Consent: I have reviewed the patients History and Physical, chart, labs and discussed the procedure including the risks, benefits and alternatives for the proposed anesthesia with the patient or authorized representative who has indicated his/her understanding and acceptance.     Dental advisory given  Plan Discussed with: CRNA and Surgeon  Anesthesia Plan Comments:        Anesthesia Quick Evaluation

## 2021-12-16 NOTE — Progress Notes (Signed)
Progress Note   Patient: Jose Lamb TIR:[id number] DOB: 12/05/1960 DOA: 12/13/2021     2 DOS: the patient was seen and examined on 12/16/2021   Brief hospital course: As per H&P written by Dr. Clearence Lamb on 12/14/21 Jose Lamb is a 61 y.o. male with medical history significant of atrial fibrillation, pulmonary embolism, essential hypertension duodenal ulcer, long COVID, and more presents to the ED complain of abdominal pain.  Patient reports he has had abdominal pain for 4 days.  His abdomen has been swollen since then 2.  The pain is described as a pressure that is constant.  He has had diarrhea 4-5 times per day that is nonbloody.  Patient denies having melena as well.  At first having a bowel movement would relieve the pressure, but as the pain progressed the bowel movements no longer relieved the pain.  Patient reports that he has felt feverish and chilled at home without a measured fever.  In the ER his initial temperature was 100.9 and it went up after Tylenol.  Patient reports that he has been vomiting 3-4 times per day which is also nonbloody.  It appears chocolate brown-colored.  Patient reports has been having to sleep sitting up because his pain is worse when he lays flat.  He has had choking spells and coughing spells after vomiting.  Patient reports he has been coughing for couple weeks anyways.  He went to an outpatient provider who prescribed Tessalon Perles, steroid, albuterol.  It helped initially, but then his symptoms returned.  He is still coughing and reports it is productive of white mucus.  He denies chest pain but admits to shortness of breath.  He has been using his nebulizer 3 times per day.  Patient has no other complaints at this time.   Assessment and Plan: * SBO (small bowel obstruction) (HCC) - The scan shows high-grade SBO secondary to umbilical hernia - General surgery actively following patient with intentions for surgical correction later today  12/16/2021 -Continue n.p.o. status for now. - Continue supportive care, adequate hydration and electrolyte repletion. -Patient lost NG tube on 12/14/2021 evening; currently no vomiting and expressed no nausea.  Continue holding on replacement for now.  GERD (gastroesophageal reflux disease) - Continue PPI.  Diabetes mellitus type 2 in obese (HCC) - Continue sliding scale coverage - Continue to follow CBGs and further adjust hypoglycemic regimen.  CAP (community acquired pneumonia) - Likely related to aspiration pneumonia. - Continue Unasyn --Legionella urine antigens - Expectorated sputum culture - Follow-up blood culture results. - COVID and flu negative - Continue to monitor -Not requiring oxygen currently. -Continue as needed bronchodilator.  Hypokalemia -In the setting of GI losses and decreased oral intake -Continue electrolyte repletion and follow trend.  History of pulmonary embolus (PE) -Continue holding xarelto -Continue heparin for DVT prophylaxis.  Acute respiratory failure with hypoxia (HCC) -which Occurred after an episode of vomiting as an outpatient -Work-up demonstrating what appears to be aspiration pneumonia -Continue current antibiotic -Continue the use of flutter valve/incentive spirometer -No oxygen required on today's evaluation -Continue as needed bronchodilators.  Class I obesity -Low calorie diet and portion control discussed with patient. -Body mass index is 31.93 kg/m.  HIV + -currently not taking any antiretroviral as per med-rec -will check CD4 count and viral load.  Subjective: . Patient reports intermittent pain; no bowel movements or passing gas currently.  Reports no nausea, no vomiting and overall and overall uneventful night.  Physical Exam: Vitals:   12/16/21 0931 12/16/21 1229  12/16/21 1430 12/16/21 1445  BP: 120/69 (!) 144/70 (!) 176/129 (!) 168/99  Pulse: 86 84 90 86  Resp:  '19 15 18  '$ Temp: 100 F (37.8 C) 99 F (37.2 C)  98.8 F (37.1 C)   TempSrc: Oral Oral    SpO2: 93% 95% 98% 97%  Weight:  106.8 kg    Height:  6' (1.829 m)     General exam: Alert, awake, oriented x 3; good saturation on room air; no nausea, no vomiting, no fever, no chest pain.  Continues to have intermittent abdominal discomfort. Respiratory system: Clear to auscultation. Respiratory effort normal.  Good saturation on room air. Cardiovascular system:RRR. No murmurs, rubs, gallops.  No JVD. Gastrointestinal system: Abdomen is obese, distended, positive umbilical hernia partially incarcerated and with mild guarding due to pain on palpation.  No bowel sounds appreciated. Central nervous system: Alert and oriented. No focal neurological deficits. Extremities: No cyanosis or clubbing. Skin: No AKI. Psychiatry: Judgement and insight appear normal. Mood & affect appropriate.   Data Reviewed: CBC: WBCs 3.0, hemoglobin 12.6 and platelet count 950 K Basic metabolic panel: Sodium 932, potassium 4.1, chloride 103, bicarb 27, BUN 26, creatinine 0.91 Magnesium 2.5  Family Communication: No family at bedside on today's evaluation.  Disposition: Status is: Inpatient Remains inpatient appropriate because: Continue treatment for SBO and acute pneumonia.   Planned Discharge Destination: Home  Time spent: 35 minutes  Author: Barton Dubois, MD 12/16/2021 4:08 PM  For on call review [email or web address].

## 2021-12-16 NOTE — Progress Notes (Signed)
Pharmacy Antibiotic Note  Jose Lamb is a 61 y.o. male admitted on 12/13/2021 with pneumonia.  Pharmacy has been consulted for Unasyn dosing. WBC WNL. Renal function good.  ASP PNA, empiric tx  Plan: Increase Unasyn 3g IV q6h Trend WBC, temp, renal function  F/U deescalation and LOT  Height: 6' (182.9 cm) Weight: 106.8 kg (235 lb 7.2 oz) IBW/kg (Calculated) : 77.6  Temp (24hrs), Avg:98.9 F (37.2 C), Min:98.4 F (36.9 C), Max:100 F (37.8 C)  Recent Labs  Lab 12/14/21 0009 12/14/21 0223 12/14/21 0555 12/15/21 0353 12/16/21 0320  WBC 4.0  --   --  3.4* 3.0*  CREATININE 1.05  --  1.39* 1.24 0.91  LATICACIDVEN 1.3 1.8  --   --   --      Estimated Creatinine Clearance: 110.4 mL/min (by C-G formula based on SCr of 0.91 mg/dL).    Allergies  Allergen Reactions   Asa [Aspirin] Other (See Comments)    Nose bleeds    Unasyn 11/8>>  Isac Sarna, BS Pharm D, BCPS Clinical Pharmacist

## 2021-12-16 NOTE — Anesthesia Postprocedure Evaluation (Signed)
Anesthesia Post Note  Patient: Jose Lamb  Procedure(s) Performed: HERNIA REPAIR UMBILICAL ADULT WITH MESH (Abdomen)  Patient location during evaluation: Phase II Anesthesia Type: General Level of consciousness: awake and alert and oriented Pain management: pain level controlled Vital Signs Assessment: post-procedure vital signs reviewed and stable Respiratory status: spontaneous breathing, nonlabored ventilation and respiratory function stable Cardiovascular status: blood pressure returned to baseline and stable Postop Assessment: no apparent nausea or vomiting Anesthetic complications: no  No notable events documented.   Last Vitals:  Vitals:   12/16/21 1430 12/16/21 1445  BP: (!) 176/129 (!) 168/99  Pulse: 90 86  Resp: 15 18  Temp: 37.1 C   SpO2: 98% 97%    Last Pain:  Vitals:   12/16/21 1430  TempSrc:   PainSc: 9                  Wanya Bangura C Lisette Mancebo

## 2021-12-16 NOTE — Transfer of Care (Signed)
Immediate Anesthesia Transfer of Care Note  Patient: Jose Lamb  Procedure(s) Performed: HERNIA REPAIR UMBILICAL ADULT WITH MESH (Abdomen)  Patient Location: PACU  Anesthesia Type:General  Level of Consciousness: awake, alert , oriented, and patient cooperative  Airway & Oxygen Therapy: Patient Spontanous Breathing and Patient connected to face mask oxygen  Post-op Assessment: Report given to RN, Post -op Vital signs reviewed and stable, and Patient moving all extremities X 4  Post vital signs: Reviewed and stable  Last Vitals:  Vitals Value Taken Time  BP    Temp    Pulse 89 12/16/21 1424  Resp 15 12/16/21 1424  SpO2 99 % 12/16/21 1424  Vitals shown include unvalidated device data.  Last Pain:  Vitals:   12/16/21 1229  TempSrc: Oral  PainSc: 5          Complications: No notable events documented.

## 2021-12-16 NOTE — Op Note (Signed)
Patient:  Jose Lamb  DOB:  01-Mar-1960  MRN:  [id number]   Preop Diagnosis: Umbilical hernia with partial small bowel obstruction  Postop Diagnosis: Same  Procedure: Umbilical herniorrhaphy with mesh  Surgeon: Aviva Signs, MD  Anes: General endotracheal  Indications: Patient is a 61 year old black male who presents [facility] with worsening nausea, vomiting, and abdominal pain.  CT scan of the abdomen revealed a partial small bowel obstruction at the umbilicus due to incarcerated small bowel.  He was easily able to be reduced at bedside, but it kept recurring.  Due to ongoing symptoms, the patient now comes to the operating room for an umbilical herniorrhaphy with mesh.  The risks and benefits of the procedure including bleeding, infection, mesh use, the possibility of recurrence of the hernia were fully explained to the patient, who gave informed consent.  Procedure note: The patient was placed in the supine position.  After induction of general endotracheal anesthesia, the abdomen was prepped and draped using usual sterile technique with ChloraPrep.  Surgical site confirmation was performed.  An infraumbilical incision was made down to the fascia.  The hernia sac was excised from the underside of the umbilicus.  The patient had some falciform ligament and a well formed mature hernia sac.  No bowel contents were present at the time.  The hernia sac was excised down to the fascia.  A 4 cm defect was found.  An 8 cm Bard Ventralax ST patch was then inserted and secured to the fascia using 0 Ethibond interrupted sutures.  The overlying fascia was reapproximated transversely using 0 Ethibond interrupted sutures.  The base of the umbilicus was secured back to the fascia using a 2-0 Vicryl interrupted suture.  Subcutaneous layer was reapproximated using 3-0 Vicryl interrupted suture.  Exparel was instilled into the surrounding wound.  The skin was closed using a 4-0 Monocryl subcuticular  suture.  Dermabond was applied.  All tape and needle counts were correct at the end of the procedure.  The patient was extubated in the operating room and transferred to PACU in stable condition.  Complications: None  EBL: Minimal  Specimen: None

## 2021-12-16 NOTE — Progress Notes (Signed)
Pt vomited large amount of liquid earlier in the shift. States this is not the first time today.

## 2021-12-16 NOTE — Anesthesia Procedure Notes (Signed)
Procedure Name: Intubation Date/Time: 12/16/2021 1:20 PM  Performed by: Jonna Munro, CRNAPre-anesthesia Checklist: Patient identified, Emergency Drugs available, Suction available, Patient being monitored and Timeout performed Patient Re-evaluated:Patient Re-evaluated prior to induction Oxygen Delivery Method: Circle system utilized Preoxygenation: Pre-oxygenation with 100% oxygen Induction Type: IV induction, Rapid sequence and Cricoid Pressure applied Laryngoscope Size: Mac and 3 Grade View: Grade I Tube type: Oral Tube size: 7.5 mm Number of attempts: 1 Airway Equipment and Method: Stylet Placement Confirmation: ETT inserted through vocal cords under direct vision, positive ETCO2, CO2 detector and breath sounds checked- equal and bilateral Secured at: 22 cm Tube secured with: Tape Dental Injury: Teeth and Oropharynx as per pre-operative assessment

## 2021-12-17 DIAGNOSIS — K219 Gastro-esophageal reflux disease without esophagitis: Secondary | ICD-10-CM

## 2021-12-17 DIAGNOSIS — J690 Pneumonitis due to inhalation of food and vomit: Secondary | ICD-10-CM

## 2021-12-17 DIAGNOSIS — E119 Type 2 diabetes mellitus without complications: Secondary | ICD-10-CM

## 2021-12-17 DIAGNOSIS — Z794 Long term (current) use of insulin: Secondary | ICD-10-CM

## 2021-12-17 LAB — GLUCOSE, CAPILLARY
Glucose-Capillary: 118 mg/dL — ABNORMAL HIGH (ref 70–99)
Glucose-Capillary: 113 mg/dL — ABNORMAL HIGH (ref 70–99)

## 2021-12-17 LAB — BASIC METABOLIC PANEL
Anion gap: 6 (ref 5–15)
BUN: 14 mg/dL (ref 6–20)
CO2: 25 mmol/L (ref 22–32)
Calcium: 7.4 mg/dL — ABNORMAL LOW (ref 8.9–10.3)
Chloride: 107 mmol/L (ref 98–111)
Creatinine, Ser: 0.74 mg/dL (ref 0.61–1.24)
GFR, Estimated: >60 mL/min (ref >60)
Glucose, Bld: 124 mg/dL — ABNORMAL HIGH (ref 70–99)
Potassium: 4.2 mmol/L (ref 3.5–5.1)
Sodium: 138 mmol/L (ref 135–145)

## 2021-12-17 LAB — CBC
HCT: 36 % — ABNORMAL LOW (ref 39.0–52.0)
Hemoglobin: 11.5 g/dL — ABNORMAL LOW (ref 13.0–17.0)
MCH: 29.3 pg (ref 26.0–34.0)
MCHC: 31.9 g/dL (ref 30.0–36.0)
MCV: 91.8 fL (ref 80.0–100.0)
Platelets: 127 10*3/uL — ABNORMAL LOW (ref 150–400)
RBC: 3.92 MIL/uL — ABNORMAL LOW (ref 4.22–5.81)
RDW: 13.4 % (ref 11.5–15.5)
WBC: 4.4 10*3/uL (ref 4.0–10.5)
nRBC: 0 % (ref 0.0–0.2)

## 2021-12-17 LAB — HIV-1 RNA QUANT-NO REFLEX-BLD
HIV 1 RNA Quant: 328000 copies/mL
LOG10 HIV-1 RNA: 5.516 log10copy/mL

## 2021-12-17 MED ORDER — POTASSIUM CHLORIDE ER 20 MEQ PO TBCR: 1.0000 | EXTENDED_RELEASE_TABLET | Freq: Every day | ORAL | Status: DC

## 2021-12-17 MED ORDER — OXYCODONE HCL 5 MG PO TABS: 5.0000 mg | ORAL_TABLET | Freq: Four times a day (QID) | ORAL | 0 refills | Status: DC | PRN

## 2021-12-17 MED ORDER — PANTOPRAZOLE SODIUM 40 MG PO TBEC: 40.0000 mg | DELAYED_RELEASE_TABLET | Freq: Two times a day (BID) | ORAL | Status: DC

## 2021-12-17 MED ORDER — AMOXICILLIN-POT CLAVULANATE 875-125 MG PO TABS: 1.0000 | ORAL_TABLET | Freq: Two times a day (BID) | ORAL | Status: DC

## 2021-12-17 MED ORDER — AMOXICILLIN-POT CLAVULANATE 875-125 MG PO TABS: 1.0000 | ORAL_TABLET | Freq: Two times a day (BID) | ORAL | 0 refills | Status: AC

## 2021-12-17 NOTE — Assessment & Plan Note (Signed)
-  Body mass index is 31.93 kg/m. -Low calorie diet, portion control and increase physical activity discussed with patient.

## 2021-12-17 NOTE — Discharge Summary (Signed)
Physician Discharge Summary   Patient: Jose Lamb MRN: [id number] DOB: 09/20/1960  Admit date:     12/13/2021  Discharge date: 12/17/21  Discharge Physician: Barton Dubois   PCP: [name], [name]   Recommendations at discharge:  Repeat basic metabolic panel to follow electrolytes and renal function Close monitoring of patient's CBGs/A1c with further adjustment to hypoglycemia regimen as needed. Continue close monitoring of patient's HIV RNA quantitative and CD4 count with outpatient referral to infectious disease as required. Repeat chest x-ray in 3-4 weeks to assure resolution of infiltrates.  Discharge Diagnoses: Principal Problem:   SBO (small bowel obstruction) (HCC) Active Problems:   Class 1 obesity   Acute respiratory failure with hypoxia (HCC)   History of pulmonary embolus (PE)   Hypokalemia   CAP (community acquired pneumonia)   Diabetes mellitus type 2 in obese (HCC)   GERD (gastroesophageal reflux disease)   Umbilical hernia without obstruction and without gangrene   Aspiration pneumonia due to gastric secretions ([place])   Type 2 diabetes mellitus without complication, with long-term current use of insulin [facility])   Hospital Course: As per H&P written by Dr. Clearence Ped on 12/14/21 Jose Lamb is a 61 y.o. male with medical history significant of atrial fibrillation, pulmonary embolism, essential hypertension duodenal ulcer, long COVID, and more presents to the ED complain of abdominal pain.  Patient reports he has had abdominal pain for 4 days.  His abdomen has been swollen since then 2.  The pain is described as a pressure that is constant.  He has had diarrhea 4-5 times per day that is nonbloody.  Patient denies having melena as well.  At first having a bowel movement would relieve the pressure, but as the pain progressed the bowel movements no longer relieved the pain.  Patient reports that he has felt feverish and chilled at home without a measured fever.   In the ER his initial temperature was 100.9 and it went up after Tylenol.  Patient reports that he has been vomiting 3-4 times per day which is also nonbloody.  It appears chocolate brown-colored.  Patient reports has been having to sleep sitting up because his pain is worse when he lays flat.  He has had choking spells and coughing spells after vomiting.  Patient reports he has been coughing for couple weeks anyways.  He went to an outpatient provider who prescribed Tessalon Perles, steroid, albuterol.  It helped initially, but then his symptoms returned.  He is still coughing and reports it is productive of white mucus.  He denies chest pain but admits to shortness of breath.  He has been using his nebulizer 3 times per day.  Patient has no other complaints at this time.  Assessment and Plan: * SBO (small bowel obstruction) (HCC) - The scan shows high-grade SBO secondary to umbilical hernia - Status post surgical repair of umbilical hernia and completely asymptomatic at discharge. -Patient tolerating diet and feeling ready to go home. -Outpatient follow-up with general surgery has been arranged -Continue as needed analgesic as prescribed. -Patient instructed to maintain adequate hydration.  Umbilical hernia without obstruction and without gangrene -Status post surgical repair. -Continue outpatient follow-up with general surgery service.  GERD (gastroesophageal reflux disease) - Continue PPI.  Diabetes mellitus type 2 in obese (HCC) - Stable overall -Resume home hypoglycemic regimen -Modified carbohydrate diet discussed with patient.  CAP (community acquired pneumonia) - Likely related to aspiration pneumonia. - Treated with Unasyn and subsequent transition to oral Augmentin to complete antibiotic therapy. --  Legionella urine antigens without growth; negative blood cultures no growth on expectorated sputum. - COVID and flu negative - Continue to monitor -Not requiring oxygen  currently. -Continue as needed bronchodilator.  Hypokalemia -In the setting of GI losses and decreased oral intake -Repleted and within normal limits at discharge -Continue to follow electrolytes trend intermittently.  History of pulmonary embolus (PE) -Resume the use of Xarelto.  Acute respiratory failure with hypoxia (HCC) -which Occurred after an episode of vomiting as an outpatient -Work-up demonstrating what appears to be aspiration pneumonia -Excellent response to antibiotic use; discharge on oral Augmentin to complete therapy. -No requiring oxygen supplementation at time of discharge. -Continue the use of flutter valve and as needed bronchodilator management.   Class 1 obesity -Body mass index is 31.93 kg/m. -Low calorie diet, portion control and increase physical activity discussed with patient.  History of HIV -Patient reports a stable CD4 counts and undetectable virus. -He has been without medication since 2015 -Patient used to follow-up with ID at [facility]. -Follow-up pending CD4 counts and viral load -Outpatient follow-up with infectious disease recommended.   Consultants: General surgery Procedures performed: See below for x-ray reports Disposition: Home Diet recommendation: Heart healthy, modified carbohydrates and low calorie diet.  DISCHARGE MEDICATION: Allergies as of 12/17/2021       Reactions   Asa [aspirin] Other (See Comments)   Nose bleeds        Medication List     STOP taking these medications    apixaban 5 MG Tabs tablet Commonly known as: ELIQUIS   triamcinolone cream 0.1 % Commonly known as: KENALOG       TAKE these medications    albuterol 108 (90 Base) MCG/ACT inhaler Commonly known as: VENTOLIN HFA 1 puff every 4 (four) hours as needed for wheezing or shortness of breath.   albuterol (2.5 MG/3ML) 0.083% nebulizer solution Commonly known as: PROVENTIL Take 2.5 mg by nebulization every 4 (four) hours.    amoxicillin-clavulanate 875-125 MG tablet Commonly known as: AUGMENTIN Take 1 tablet by mouth every 12 (twelve) hours for 5 days.   famotidine 20 MG tablet Commonly known as: Pepcid One after supper What changed:  how much to take how to take this when to take this   furosemide 40 MG tablet Commonly known as: LASIX Take 40 mg by mouth 2 (two) times daily as needed for fluid.   Lantus SoloStar 100 UNIT/ML Solostar Pen Generic drug: insulin glargine 16 Units daily.   oxyCODONE 5 MG immediate release tablet Commonly known as: Roxicodone Take 1 tablet (5 mg total) by mouth every 6 (six) hours as needed.   pantoprazole 40 MG tablet Commonly known as: Protonix Take 1 tablet (40 mg total) by mouth daily. Take 30-60 min before first meal of the day   Potassium Chloride ER 20 MEQ Tbcr Take 1 tablet by mouth daily at 6 (six) AM. What changed: when to take this   Xarelto 20 MG Tabs tablet Generic drug: rivaroxaban Take 20 mg by mouth daily.        Follow-up Information     Aviva Signs, MD Follow up.   Specialty: General Surgery Why: As needed.  Will call you next week for follow up. Contact information: [address] [address] [phone]         [name], [name]. Schedule an appointment as soon as possible for a visit in 2 week(s).   Specialty: Family Medicine Contact information: [address] [place] [address] [phone]  Discharge Exam: Filed Weights   12/13/21 2047 12/14/21 0557 12/16/21 1229  Weight: 113.4 kg 106.8 kg 106.8 kg   General exam: Alert, awake, oriented x 3; tolerating diet and feeling ready to go home.  Minimal incisional pain reported.  No nausea, no vomiting, no oxygen requirement. Respiratory system: Clear to auscultation. Respiratory effort normal.  No using accessory muscles. Cardiovascular system:RRR. No murmurs, rubs, gallops. Gastrointestinal system: Abdomen is  obese, soft, positive bowel sounds and incision healing well.   Central nervous system: Alert and oriented. No focal neurological deficits. Extremities: No cyanosis or clubbing. Skin: No petechiae. Psychiatry: Judgement and insight appear normal. Mood & affect appropriate.    Condition at discharge: Stable and improved.  The results of significant diagnostics from this hospitalization (including imaging, microbiology, ancillary and laboratory) are listed below for reference.   Imaging Studies: DG Chest Portable 1 View  Result Date: 12/14/2021 CLINICAL DATA:  Nasogastric tube placement. EXAM: PORTABLE CHEST 1 VIEW COMPARISON:  December 13, 2021 FINDINGS: A nasogastric tube is seen with its distal tip noted within the body of the stomach. Its distal side hole sits approximately 5.8 cm distal to the expected region of the gastroesophageal junction. The heart size and mediastinal contours are within normal limits. Low lung volumes are seen with mild, diffuse, chronic appearing increased interstitial lung markings. Mild atelectatic changes are seen within the bilateral lung bases. There is no evidence of a pleural effusion or pneumothorax. Multilevel degenerative changes are seen throughout the thoracic spine. IMPRESSION: 1. Nasogastric tube positioning, as described above. 2. Mild bibasilar atelectasis. Electronically Signed   By: Virgina Norfolk M.D.   On: 12/14/2021 03:37   CT ABDOMEN PELVIS W CONTRAST  Result Date: 12/14/2021 CLINICAL DATA:  Umbilical hernia pain for the last few days with hernia enlargement EXAM: CT ABDOMEN AND PELVIS WITH CONTRAST TECHNIQUE: Multidetector CT imaging of the abdomen and pelvis was performed using the standard protocol following bolus administration of intravenous contrast. RADIATION DOSE REDUCTION: This exam was performed according to the departmental dose-optimization program which includes automated exposure control, adjustment of the mA and/or kV according to  patient size and/or use of iterative reconstruction technique. CONTRAST:  167m OMNIPAQUE IOHEXOL 300 MG/ML  SOLN COMPARISON:  CT 03/26/2018 FINDINGS: Lower chest: Bronchial wall thickening and patchy airspace opacities in the bilateral lower lobes. Trace left pleural effusion or pleural thickening. Hepatobiliary: No focal liver abnormality is seen. No gallstones, gallbladder wall thickening, or biliary dilatation. Pancreas: Unremarkable. No pancreatic ductal dilatation or surrounding inflammatory changes. Spleen: Normal in size without focal abnormality. Adrenals/Urinary Tract: Unremarkable adrenal glands. Hypoattenuating lesions in both kidneys statistically likely to represent cysts. No follow-up recommended. Nonobstructing bilateral nephrolithiasis. No hydronephrosis. Unremarkable bladder. Stomach/Bowel: Diffuse dilation of the small bowel upstream from the herniated portion of small bowel in the periumbilical hernia. Findings compatible with small-bowel obstruction secondary to umbilical hernia. The small bowel within the hernia sac demonstrates wall thickening though appears to enhance normally. The small bowel and colon downstream from the umbilical hernia are decompressed. Normal appendix. Distended stomach. Vascular/Lymphatic: Aortic atherosclerosis. No enlarged abdominal or pelvic lymph nodes. Reproductive: Enlarged prostate. Other: Fat containing bilateral inguinal hernias. No free intraperitoneal fluid or air. Musculoskeletal: No acute or significant osseous findings. IMPRESSION: High-grade small bowel obstruction secondary to herniation into the umbilical hernia. Bronchial wall thickening and infiltrates in the lower lobes compatible with infection and/or aspiration. Aortic Atherosclerosis (ICD10-I70.0). Electronically Signed   By: TPlacido SouM.D.   On: 12/14/2021 02:05  DG Chest Port 1 View  Result Date: 12/14/2021 CLINICAL DATA:  Questionable sepsis - evaluate for abnormality EXAM: PORTABLE  CHEST 1 VIEW COMPARISON:  Radiograph 09/15/2019 FINDINGS: Mild elevation of left hemidiaphragm. Streaky opacities at the left lung base typical of atelectasis. Overall improved lung aeration from prior exam. Stable heart size and mediastinal contours. No pleural effusion or pneumothorax. No pulmonary edema. Stable osseous structures. IMPRESSION: Mild elevation of the left hemidiaphragm with streaky left basilar atelectasis. Electronically Signed   By: Keith Rake M.D.   On: 12/14/2021 00:01    Microbiology: Results for orders placed or performed during the hospital encounter of 12/13/21  Resp Panel by RT-PCR (Flu A&B, Covid) Anterior Nasal Swab     Status: None   Collection Time: 12/14/21 12:03 AM   Specimen: Anterior Nasal Swab  Result Value Ref Range Status   SARS Coronavirus 2 by RT PCR NEGATIVE NEGATIVE Final    Comment: (NOTE) SARS-CoV-2 target nucleic acids are NOT DETECTED.  The SARS-CoV-2 RNA is generally detectable in upper respiratory specimens during the acute phase of infection. The lowest concentration of SARS-CoV-2 viral copies this assay can detect is 138 copies/mL. A negative result does not preclude SARS-Cov-2 infection and should not be used as the sole basis for treatment or other patient management decisions. A negative result may occur with  improper specimen collection/handling, submission of specimen other than nasopharyngeal swab, presence of viral mutation(s) within the areas targeted by this assay, and inadequate number of viral copies(<138 copies/mL). A negative result must be combined with clinical observations, patient history, and epidemiological information. The expected result is Negative.  Fact Sheet for Patients:  EntrepreneurPulse.com.au  Fact Sheet for Healthcare Providers:  IncredibleEmployment.be  This test is no t yet approved or cleared by the [place] FDA and  has been authorized for detection and/or  diagnosis of SARS-CoV-2 by FDA under an Emergency Use Authorization (EUA). This EUA will remain  in effect (meaning this test can be used) for the duration of the COVID-19 declaration under Section 564(b)(1) of the Act, 21 U.S.C.section 360bbb-3(b)(1), unless the authorization is terminated  or revoked sooner.       Influenza A by PCR NEGATIVE NEGATIVE Final   Influenza B by PCR NEGATIVE NEGATIVE Final    Comment: (NOTE) The Xpert Xpress SARS-CoV-2/FLU/RSV plus assay is intended as an aid in the diagnosis of influenza from Nasopharyngeal swab specimens and should not be used as a sole basis for treatment. Nasal washings and aspirates are unacceptable for Xpert Xpress SARS-CoV-2/FLU/RSV testing.  Fact Sheet for Patients: EntrepreneurPulse.com.au  Fact Sheet for Healthcare Providers: IncredibleEmployment.be  This test is not yet approved or cleared by the [place] FDA and has been authorized for detection and/or diagnosis of SARS-CoV-2 by FDA under an Emergency Use Authorization (EUA). This EUA will remain in effect (meaning this test can be used) for the duration of the COVID-19 declaration under Section 564(b)(1) of the Act, 21 U.S.C. section 360bbb-3(b)(1), unless the authorization is terminated or revoked.  Performed at [facility], [address] [place], [place] [address]   Blood Culture (routine x 2)     Status: None (Preliminary result)   Collection Time: 12/14/21 12:09 AM   Specimen: BLOOD  Result Value Ref Range Status   Specimen Description BLOOD BLOOD RIGHT ARM  Final   Special Requests   Final    BOTTLES DRAWN AEROBIC AND ANAEROBIC Blood Culture adequate volume   Culture   Final    NO GROWTH  3 DAYS Performed at [facility], [address] [place], [place] [address]    Report Status PENDING  Incomplete  Blood Culture (routine x 2)     Status: None (Preliminary result)   Collection Time: 12/14/21 12:09 AM   Specimen:  BLOOD  Result Value Ref Range Status   Specimen Description BLOOD BLOOD LEFT HAND  Final   Special Requests   Final    BOTTLES DRAWN AEROBIC AND ANAEROBIC Blood Culture adequate volume   Culture   Final    NO GROWTH 3 DAYS Performed at [facility], [address] [place], [place] [address]    Report Status PENDING  Incomplete  Surgical pcr screen     Status: None   Collection Time: 12/15/21  8:23 PM   Specimen: Nasal Mucosa; Nasal Swab  Result Value Ref Range Status   MRSA, PCR NEGATIVE NEGATIVE Final   Staphylococcus aureus NEGATIVE NEGATIVE Final    Comment: (NOTE) The Xpert SA Assay (FDA approved for NASAL specimens in patients 65 years of age and older), is one component of a comprehensive surveillance program. It is not intended to diagnose infection nor to guide or monitor treatment. Performed at [facility], [address], [place], [place] [address]     Labs: CBC: Recent Labs  Lab 12/14/21 0009 12/15/21 0353 12/16/21 0320 12/17/21 0516  WBC 4.0 3.4* 3.0* 4.4  NEUTROABS 2.7  --   --   --   HGB 13.2 12.7* 12.6* 11.5*  HCT 40.1 38.6* 39.4 36.0*  MCV 89.1 88.7 91.0 91.8  PLT 129* 122* 124* 621*   Basic Metabolic Panel: Recent Labs  Lab 12/14/21 0009 12/14/21 0555 12/15/21 0353 12/16/21 0320 12/17/21 0516  NA 136 135 136 138 138  K 3.2* 3.0* 3.5 4.1 4.2  CL 101 99 99 103 107  CO2 '28 26 26 27 25  '$ GLUCOSE 128* 133* 122* 101* 124*  BUN 20 23* 33* 26* 14  CREATININE 1.05 1.39* 1.24 0.91 0.74  CALCIUM 8.4* 8.4* 7.8* 7.7* 7.4*  MG  --  2.0 2.2 2.5*  --    Liver Function Tests: Recent Labs  Lab 12/14/21 0009 12/14/21 0555  AST 27 24  ALT 22 21  ALKPHOS 70 66  BILITOT 1.2 1.3*  PROT 8.9* 8.5*  ALBUMIN 4.0 3.9   CBG: Recent Labs  Lab 12/16/21 1427 12/16/21 1651 12/16/21 2354 12/17/21 0613 12/17/21 1124  GLUCAP 82 118* 145* 118* 113*    Discharge time spent: greater than 30 minutes.  Signed: Barton Dubois, MD [facility] 12/17/2021

## 2021-12-17 NOTE — Assessment & Plan Note (Signed)
-  Status post surgical repair. -Continue outpatient follow-up with general surgery service.

## 2021-12-17 NOTE — Progress Notes (Signed)
1 Day Post-Op  Subjective: Patient has minimal incisional pain.  Had a bowel movement this morning.  Is hungry.  Denies any nausea.  Objective: Vital signs in last 24 hours: Temp:  [97.6 F (36.4 C)-99 F (37.2 C)] 97.9 F (36.6 C) (11/11 0450) Pulse Rate:  [64-90] 64 (11/11 0450) Resp:  [15-24] 19 (11/11 0450) BP: (115-176)/(70-129) 131/89 (11/11 0450) SpO2:  [95 %-100 %] 100 % (11/11 0450) Weight:  [106.8 kg] 106.8 kg (11/10 1229) Last BM Date : 12/16/21  Intake/Output from previous day: 11/10 0701 - 11/11 0700 In: 4641.6 [P.O.:480; I.V.:4013.5; IV Piggyback:148.1] Out: 352 [Urine:350; Blood:2] Intake/Output this shift: Total I/O In: 240 [P.O.:240] Out: -   General appearance: alert, cooperative, and no distress GI: Soft, incision healing well.  Bowel sounds active.  Lab Results:  Recent Labs    12/16/21 0320 12/17/21 0516  WBC 3.0* 4.4  HGB 12.6* 11.5*  HCT 39.4 36.0*  PLT 124* 127*   BMET Recent Labs    12/16/21 0320 12/17/21 0516  NA 138 138  K 4.1 4.2  CL 103 107  CO2 27 25  GLUCOSE 101* 124*  BUN 26* 14  CREATININE 0.91 0.74  CALCIUM 7.7* 7.4*   PT/INR No results for input(s): "LABPROT", "INR" in the last 72 hours.  Studies/Results: No results found.  Anti-infectives: Anti-infectives (From admission, onward)    Start     Dose/Rate Route Frequency Ordered Stop   12/16/21 1200  Ampicillin-Sulbactam (UNASYN) 3 g in sodium chloride 0.9 % 100 mL IVPB        3 g 200 mL/hr over 30 Minutes Intravenous Every 6 hours 12/16/21 1111     12/14/21 0400  Ampicillin-Sulbactam (UNASYN) 3 g in sodium chloride 0.9 % 100 mL IVPB  Status:  Discontinued        3 g 200 mL/hr over 30 Minutes Intravenous Every 8 hours 12/14/21 0354 12/16/21 1111       Assessment/Plan: s/p Procedure(s): HERNIA REPAIR UMBILICAL ADULT WITH MESH Impression: Stable on postoperative day 1.  Bowel function has returned. Plan: We will advance to regular diet today.  Should he  tolerate that well, he may be discharged from the surgery standpoint.  I have given him a work note.  He may return to work on 12/28/2021.  I will follow-up with him by virtual telephone visit next week.  LOS: 3 days    Aviva Signs 12/17/2021

## 2021-12-19 LAB — CULTURE, BLOOD (ROUTINE X 2)
Culture: NO GROWTH
Culture: NO GROWTH
Special Requests: ADEQUATE
Special Requests: ADEQUATE

## 2021-12-20 ENCOUNTER — Other Ambulatory Visit (HOSPITAL_COMMUNITY): Payer: Self-pay | Admitting: Family Medicine

## 2021-12-20 DIAGNOSIS — G459 Transient cerebral ischemic attack, unspecified: Secondary | ICD-10-CM

## 2021-12-22 ENCOUNTER — Other Ambulatory Visit (HOSPITAL_COMMUNITY)
Admission: RE | Admit: 2021-12-22 | Discharge: 2021-12-22 | Disposition: A | Discharge status: Home / Self Care | Payer: 59 | Source: Ambulatory Visit | Attending: Family Medicine | Admitting: Family Medicine

## 2021-12-22 DIAGNOSIS — R600 Localized edema: Secondary | ICD-10-CM | POA: Insufficient documentation

## 2021-12-22 LAB — CBC WITH DIFFERENTIAL/PLATELET
Abs Immature Granulocytes: 0.01 10*3/uL (ref 0.00–0.07)
Basophils Absolute: 0 10*3/uL (ref 0.0–0.1)
Basophils Relative: 0 %
Eosinophils Absolute: 0.2 10*3/uL (ref 0.0–0.5)
Eosinophils Relative: 5 %
HCT: 34.3 % — ABNORMAL LOW (ref 39.0–52.0)
Hemoglobin: 11.3 g/dL — ABNORMAL LOW (ref 13.0–17.0)
Immature Granulocytes: 0 %
Lymphocytes Relative: 21 %
Lymphs Abs: 0.9 10*3/uL (ref 0.7–4.0)
MCH: 29 pg (ref 26.0–34.0)
MCHC: 32.9 g/dL (ref 30.0–36.0)
MCV: 87.9 fL (ref 80.0–100.0)
Monocytes Absolute: 0.5 10*3/uL (ref 0.1–1.0)
Monocytes Relative: 10 %
Neutro Abs: 2.9 10*3/uL (ref 1.7–7.7)
Neutrophils Relative %: 64 %
Platelets: 203 10*3/uL (ref 150–400)
RBC: 3.9 MIL/uL — ABNORMAL LOW (ref 4.22–5.81)
RDW: 13.5 % (ref 11.5–15.5)
WBC: 4.5 10*3/uL (ref 4.0–10.5)
nRBC: 0 % (ref 0.0–0.2)

## 2021-12-22 LAB — COMPREHENSIVE METABOLIC PANEL
ALT: 33 U/L (ref 0–44)
AST: 30 U/L (ref 15–41)
Albumin: 3 g/dL — ABNORMAL LOW (ref 3.5–5.0)
Alkaline Phosphatase: 74 U/L (ref 38–126)
Anion gap: 7 (ref 5–15)
BUN: 8 mg/dL (ref 6–20)
CO2: 22 mmol/L (ref 22–32)
Calcium: 8.5 mg/dL — ABNORMAL LOW (ref 8.9–10.3)
Chloride: 107 mmol/L (ref 98–111)
Creatinine, Ser: 0.71 mg/dL (ref 0.61–1.24)
GFR, Estimated: >60 mL/min (ref >60)
Glucose, Bld: 135 mg/dL — ABNORMAL HIGH (ref 70–99)
Potassium: 2.9 mmol/L — ABNORMAL LOW (ref 3.5–5.1)
Sodium: 136 mmol/L (ref 135–145)
Total Bilirubin: 0.2 mg/dL — ABNORMAL LOW (ref 0.3–1.2)
Total Protein: 7.1 g/dL (ref 6.5–8.1)

## 2021-12-22 LAB — BRAIN NATRIURETIC PEPTIDE: B Natriuretic Peptide: 122 pg/mL — ABNORMAL HIGH (ref 0.0–100.0)

## 2021-12-23 ENCOUNTER — Encounter (HOSPITAL_COMMUNITY): Payer: Self-pay | Admitting: General Surgery

## 2021-12-23 ENCOUNTER — Ambulatory Visit (INDEPENDENT_AMBULATORY_CARE_PROVIDER_SITE_OTHER): Payer: 59 | Admitting: General Surgery

## 2021-12-23 DIAGNOSIS — Z09 Encounter for follow-up examination after completed treatment for conditions other than malignant neoplasm: Secondary | ICD-10-CM

## 2021-12-23 NOTE — Progress Notes (Signed)
Attempted to contact patient by phone.  Unable to leave message as his voice box was not set up.

## 2022-01-02 ENCOUNTER — Telehealth: Payer: Self-pay | Admitting: Family Medicine

## 2022-01-02 NOTE — Telephone Encounter (Signed)
FMLA and [employer] STD paperwork completed and pt's wife to pick up.   Paperwork for [employer] insurance STD paperwork completed and faxed to [phone]. Confirmation received.   Patient out of work 12/13/2021 with an unrestricted RTW on 02/01/2022.

## 2022-01-11 ENCOUNTER — Ambulatory Visit (HOSPITAL_COMMUNITY): Payer: 59

## 2022-01-11 ENCOUNTER — Encounter (HOSPITAL_COMMUNITY): Payer: Self-pay

## 2022-01-19 ENCOUNTER — Other Ambulatory Visit (HOSPITAL_COMMUNITY)
Admission: RE | Admit: 2022-01-19 | Discharge: 2022-01-19 | Disposition: A | Discharge status: Home / Self Care | Payer: 59 | Source: Ambulatory Visit | Attending: Family Medicine | Admitting: Family Medicine

## 2022-01-19 DIAGNOSIS — Z0001 Encounter for general adult medical examination with abnormal findings: Secondary | ICD-10-CM | POA: Insufficient documentation

## 2022-01-19 DIAGNOSIS — E1165 Type 2 diabetes mellitus with hyperglycemia: Secondary | ICD-10-CM | POA: Insufficient documentation

## 2022-01-19 LAB — COMPREHENSIVE METABOLIC PANEL
ALT: 19 U/L (ref 0–44)
AST: 20 U/L (ref 15–41)
Albumin: 3.4 g/dL — ABNORMAL LOW (ref 3.5–5.0)
Alkaline Phosphatase: 74 U/L (ref 38–126)
Anion gap: 5 (ref 5–15)
BUN: 10 mg/dL (ref 6–20)
CO2: 29 mmol/L (ref 22–32)
Calcium: 8.8 mg/dL — ABNORMAL LOW (ref 8.9–10.3)
Chloride: 104 mmol/L (ref 98–111)
Creatinine, Ser: 0.75 mg/dL (ref 0.61–1.24)
GFR, Estimated: >60 mL/min (ref >60)
Glucose, Bld: 89 mg/dL (ref 70–99)
Potassium: 3.6 mmol/L (ref 3.5–5.1)
Sodium: 138 mmol/L (ref 135–145)
Total Bilirubin: 0.4 mg/dL (ref 0.3–1.2)
Total Protein: 7.5 g/dL (ref 6.5–8.1)

## 2022-01-19 LAB — CBC WITH DIFFERENTIAL/PLATELET
Abs Immature Granulocytes: 0 10*3/uL (ref 0.00–0.07)
Basophils Absolute: 0 10*3/uL (ref 0.0–0.1)
Basophils Relative: 1 %
Eosinophils Absolute: 0.2 10*3/uL (ref 0.0–0.5)
Eosinophils Relative: 7 %
HCT: 35.8 % — ABNORMAL LOW (ref 39.0–52.0)
Hemoglobin: 11.3 g/dL — ABNORMAL LOW (ref 13.0–17.0)
Immature Granulocytes: 0 %
Lymphocytes Relative: 35 %
Lymphs Abs: 1.2 10*3/uL (ref 0.7–4.0)
MCH: 28.3 pg (ref 26.0–34.0)
MCHC: 31.6 g/dL (ref 30.0–36.0)
MCV: 89.5 fL (ref 80.0–100.0)
Monocytes Absolute: 0.4 10*3/uL (ref 0.1–1.0)
Monocytes Relative: 10 %
Neutro Abs: 1.6 10*3/uL — ABNORMAL LOW (ref 1.7–7.7)
Neutrophils Relative %: 47 %
Platelets: 135 10*3/uL — ABNORMAL LOW (ref 150–400)
RBC: 4 MIL/uL — ABNORMAL LOW (ref 4.22–5.81)
RDW: 13.9 % (ref 11.5–15.5)
WBC: 3.4 10*3/uL — ABNORMAL LOW (ref 4.0–10.5)
nRBC: 0 % (ref 0.0–0.2)

## 2022-01-19 LAB — LIPID PANEL
Cholesterol: 187 mg/dL (ref 0–200)
HDL: 38 mg/dL — ABNORMAL LOW (ref >40)
LDL Cholesterol: 126 mg/dL — ABNORMAL HIGH (ref 0–99)
Total CHOL/HDL Ratio: 4.9 RATIO
Triglycerides: 113 mg/dL (ref <150)
VLDL: 23 mg/dL (ref 0–40)

## 2022-01-19 LAB — PSA: Prostatic Specific Antigen: 2.04 ng/mL (ref 0.00–4.00)

## 2022-01-21 LAB — HEMOGLOBIN A1C
Hgb A1c MFr Bld: 6.6 % — ABNORMAL HIGH (ref 4.8–5.6)
Mean Plasma Glucose: 143 mg/dL

## 2022-04-05 ENCOUNTER — Emergency Department (HOSPITAL_COMMUNITY): Payer: Managed Care, Other (non HMO)

## 2022-04-05 ENCOUNTER — Other Ambulatory Visit: Payer: Self-pay

## 2022-04-05 ENCOUNTER — Inpatient Hospital Stay (HOSPITAL_COMMUNITY)
Admission: EM | Admit: 2022-04-05 | Discharge: 2022-04-09 | DRG: 871 | Disposition: A | Discharge status: Home / Self Care | Payer: Managed Care, Other (non HMO) | Attending: Internal Medicine | Admitting: Internal Medicine

## 2022-04-05 ENCOUNTER — Encounter (HOSPITAL_COMMUNITY): Payer: Self-pay

## 2022-04-05 DIAGNOSIS — Z79899 Other long term (current) drug therapy: Secondary | ICD-10-CM

## 2022-04-05 DIAGNOSIS — G9341 Metabolic encephalopathy: Secondary | ICD-10-CM | POA: Insufficient documentation

## 2022-04-05 DIAGNOSIS — W06XXXA Fall from bed, initial encounter: Secondary | ICD-10-CM | POA: Diagnosis present

## 2022-04-05 DIAGNOSIS — Z7984 Long term (current) use of oral hypoglycemic drugs: Secondary | ICD-10-CM

## 2022-04-05 DIAGNOSIS — I10 Essential (primary) hypertension: Secondary | ICD-10-CM | POA: Diagnosis present

## 2022-04-05 DIAGNOSIS — A419 Sepsis, unspecified organism: Secondary | ICD-10-CM | POA: Insufficient documentation

## 2022-04-05 DIAGNOSIS — E1169 Type 2 diabetes mellitus with other specified complication: Secondary | ICD-10-CM | POA: Diagnosis present

## 2022-04-05 DIAGNOSIS — A4189 Other specified sepsis: Principal | ICD-10-CM | POA: Diagnosis present

## 2022-04-05 DIAGNOSIS — R6520 Severe sepsis without septic shock: Secondary | ICD-10-CM | POA: Diagnosis present

## 2022-04-05 DIAGNOSIS — J189 Pneumonia, unspecified organism: Secondary | ICD-10-CM

## 2022-04-05 DIAGNOSIS — J9601 Acute respiratory failure with hypoxia: Secondary | ICD-10-CM | POA: Diagnosis not present

## 2022-04-05 DIAGNOSIS — Z86711 Personal history of pulmonary embolism: Secondary | ICD-10-CM

## 2022-04-05 DIAGNOSIS — Z7901 Long term (current) use of anticoagulants: Secondary | ICD-10-CM

## 2022-04-05 DIAGNOSIS — K219 Gastro-esophageal reflux disease without esophagitis: Secondary | ICD-10-CM | POA: Diagnosis present

## 2022-04-05 DIAGNOSIS — Y92003 Bedroom of unspecified non-institutional (private) residence as the place of occurrence of the external cause: Secondary | ICD-10-CM

## 2022-04-05 DIAGNOSIS — U071 COVID-19: Secondary | ICD-10-CM | POA: Diagnosis present

## 2022-04-05 DIAGNOSIS — J159 Unspecified bacterial pneumonia: Secondary | ICD-10-CM | POA: Diagnosis present

## 2022-04-05 DIAGNOSIS — Z21 Asymptomatic human immunodeficiency virus [HIV] infection status: Secondary | ICD-10-CM | POA: Diagnosis present

## 2022-04-05 DIAGNOSIS — E119 Type 2 diabetes mellitus without complications: Secondary | ICD-10-CM | POA: Diagnosis present

## 2022-04-05 DIAGNOSIS — Z886 Allergy status to analgesic agent status: Secondary | ICD-10-CM

## 2022-04-05 DIAGNOSIS — I4891 Unspecified atrial fibrillation: Secondary | ICD-10-CM | POA: Diagnosis present

## 2022-04-05 DIAGNOSIS — Z6832 Body mass index (BMI) 32.0-32.9, adult: Secondary | ICD-10-CM

## 2022-04-05 DIAGNOSIS — J1282 Pneumonia due to coronavirus disease 2019: Secondary | ICD-10-CM | POA: Diagnosis present

## 2022-04-05 DIAGNOSIS — E669 Obesity, unspecified: Secondary | ICD-10-CM | POA: Diagnosis present

## 2022-04-05 DIAGNOSIS — Z8616 Personal history of COVID-19: Secondary | ICD-10-CM

## 2022-04-05 DIAGNOSIS — Z8701 Personal history of pneumonia (recurrent): Secondary | ICD-10-CM

## 2022-04-05 DIAGNOSIS — B20 Human immunodeficiency virus [HIV] disease: Secondary | ICD-10-CM

## 2022-04-05 HISTORY — DX: Type 2 diabetes mellitus without complications: E11.9

## 2022-04-05 LAB — COMPREHENSIVE METABOLIC PANEL
ALT: 17 U/L (ref 0–44)
AST: 24 U/L (ref 15–41)
Albumin: 3.7 g/dL (ref 3.5–5.0)
Alkaline Phosphatase: 57 U/L (ref 38–126)
Anion gap: 11 (ref 5–15)
BUN: 15 mg/dL (ref 6–20)
CO2: 25 mmol/L (ref 22–32)
Calcium: 8.9 mg/dL (ref 8.9–10.3)
Chloride: 104 mmol/L (ref 98–111)
Creatinine, Ser: 1.05 mg/dL (ref 0.61–1.24)
GFR, Estimated: >60 mL/min (ref >60)
Glucose, Bld: 133 mg/dL — ABNORMAL HIGH (ref 70–99)
Potassium: 3.5 mmol/L (ref 3.5–5.1)
Sodium: 140 mmol/L (ref 135–145)
Total Bilirubin: 0.8 mg/dL (ref 0.3–1.2)
Total Protein: 8.5 g/dL — ABNORMAL HIGH (ref 6.5–8.1)

## 2022-04-05 LAB — CBC WITH DIFFERENTIAL/PLATELET
Abs Immature Granulocytes: 0.02 10*3/uL (ref 0.00–0.07)
Basophils Absolute: 0 10*3/uL (ref 0.0–0.1)
Basophils Relative: 0 %
Eosinophils Absolute: 0 10*3/uL (ref 0.0–0.5)
Eosinophils Relative: 0 %
HCT: 37.5 % — ABNORMAL LOW (ref 39.0–52.0)
Hemoglobin: 11.7 g/dL — ABNORMAL LOW (ref 13.0–17.0)
Immature Granulocytes: 0 %
Lymphocytes Relative: 13 %
Lymphs Abs: 0.9 10*3/uL (ref 0.7–4.0)
MCH: 28.6 pg (ref 26.0–34.0)
MCHC: 31.2 g/dL (ref 30.0–36.0)
MCV: 91.7 fL (ref 80.0–100.0)
Monocytes Absolute: 0.5 10*3/uL (ref 0.1–1.0)
Monocytes Relative: 8 %
Neutro Abs: 5.3 10*3/uL (ref 1.7–7.7)
Neutrophils Relative %: 79 %
Platelets: 128 10*3/uL — ABNORMAL LOW (ref 150–400)
RBC: 4.09 MIL/uL — ABNORMAL LOW (ref 4.22–5.81)
RDW: 14.5 % (ref 11.5–15.5)
WBC: 6.7 10*3/uL (ref 4.0–10.5)
nRBC: 0 % (ref 0.0–0.2)

## 2022-04-05 LAB — PROTIME-INR
INR: 1.1 (ref 0.8–1.2)
Prothrombin Time: 13.9 seconds (ref 11.4–15.2)

## 2022-04-05 LAB — LACTIC ACID, PLASMA: Lactic Acid, Venous: 1.4 mmol/L (ref 0.5–1.9)

## 2022-04-05 LAB — APTT: aPTT: 35 seconds (ref 24–36)

## 2022-04-05 MED ORDER — LACTATED RINGERS IV SOLN: INTRAVENOUS | Status: DC

## 2022-04-05 MED ORDER — SODIUM CHLORIDE 0.9 % IV SOLN
500.0000 mg | Freq: Once | INTRAVENOUS | Status: AC
  Administered 2022-04-06: 500 mg via INTRAVENOUS
  Filled 2022-04-05: qty 5

## 2022-04-05 MED ORDER — SODIUM CHLORIDE 0.9 % IV SOLN
1.0000 g | Freq: Once | INTRAVENOUS | Status: AC
  Administered 2022-04-05: 1 g via INTRAVENOUS
  Filled 2022-04-05: qty 10

## 2022-04-05 NOTE — ED Triage Notes (Signed)
Pt said he was so weak tonight that his legs gave out and he fell up against his dresser, hit the rt hip on it.

## 2022-04-05 NOTE — ED Provider Notes (Signed)
[address] [place]  Provider Note  CSN: [id number] Arrival date & time: 04/05/22 2236  History Chief Complaint  Patient presents with   Fever    Jose Lamb is a 62 y.o. male with complex PMH including HTN, DM and HIV reports he has been feeling poorly for several days but no specific complaints. Tonight he got off work and went home to bed but was feeling body aches and chills. He tried to get up to get some ibuprofen when his legs gave out and he fell to the floor. He was unable to get up and so EMS was called to the scene. He was found to have a fever of 103.80F with EMS. He reports he has had a cough recently as well as some dysuria. He reports medication compliance at home.    Home Medications Prior to Admission medications   Medication Sig Start Date End Date Taking? Authorizing Provider  albuterol (PROVENTIL) (2.5 MG/3ML) 0.083% nebulizer solution Take 2.5 mg by nebulization every 4 (four) hours. 12/01/21   [provider]  albuterol (VENTOLIN HFA) 108 (90 Base) MCG/ACT inhaler 1 puff every 4 (four) hours as needed for wheezing or shortness of breath. 10/23/19   [provider]  famotidine (PEPCID) 20 MG tablet One after supper Patient taking differently: Take 20 mg by mouth daily. One after supper 01/08/20   Tanda Rockers, MD  furosemide (LASIX) 40 MG tablet Take 40 mg by mouth 2 (two) times daily as needed for fluid. 10/07/19   [provider]  LANTUS SOLOSTAR 100 UNIT/ML Solostar Pen 16 Units daily. 10/20/19   [provider]  oxyCODONE (ROXICODONE) 5 MG immediate release tablet Take 1 tablet (5 mg total) by mouth every 6 (six) hours as needed. 12/17/21 12/17/22  Aviva Signs, MD  pantoprazole (PROTONIX) 40 MG tablet Take 1 tablet (40 mg total) by mouth daily. Take 30-60 min before first meal of the day 01/08/20   Tanda Rockers, MD  Potassium Chloride ER 20 MEQ TBCR Take 1 tablet by mouth daily at 6 (six)  AM. 12/17/21   Barton Dubois, MD  XARELTO 20 MG TABS tablet Take 20 mg by mouth daily. 12/01/21   [provider]     Allergies    Asa [aspirin]   Review of Systems   Review of Systems Please see HPI for pertinent positives and negatives  Physical Exam BP 126/72   Pulse (!) 112   Temp (!) 100.7 F (38.2 C) (Oral)   Resp (!) 27   Ht 6' (1.829 m)   Wt 113.9 kg   SpO2 100%   BMI 34.04 kg/m   Physical Exam Vitals and nursing note reviewed.  Constitutional:      Appearance: Normal appearance.  HENT:     Head: Normocephalic and atraumatic.     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 No tenderness    Nose: Nose normal.     Mouth/Throat:     Mouth: Mucous membranes are moist.  Eyes:     Extraocular Movements: Extraocular movements intact.     Conjunctiva/sclera: Conjunctivae normal.  Cardiovascular:     Rate and Rhythm: Tachycardia present.  Pulmonary:     Effort: Pulmonary effort is normal.     Breath sounds: Normal breath sounds. No wheezing or rales.  Abdominal:     General: Abdomen is flat.     Palpations:  Abdomen is soft.     Tenderness: There is no abdominal tenderness. There is no guarding.  Musculoskeletal:        General: No swelling. Normal range of motion.     Cervical back: Normal range of motion and neck supple. No rigidity or tenderness.     Right lower leg: No edema.     Left lower leg: No edema.  Skin:    General: Skin is warm and dry.     Findings: No rash.  Neurological:     General: No focal deficit present.     Mental Status: He is alert.  Psychiatric:        Mood and Affect: Mood normal.     ED Results / Procedures / Treatments   EKG EKG Interpretation  Date/Time:  Wednesday April 05 2022 23:05:47 EST Ventricular Rate:  114 PR Interval:  131 QRS Duration: 84 QT Interval:  367 QTC Calculation: 506 R Axis:   -20 Text Interpretation: Sinus  tachycardia Probable left atrial enlargement Borderline left axis deviation Borderline T wave abnormalities Prolonged QT interval No significant change since last tracing Confirmed by Calvert Cantor [phone]) on 04/05/2022 11:32:11 PM  Procedures .Critical Care  Performed by: Truddie Hidden, MD Authorized by: Truddie Hidden, MD   Critical care provider statement:    Critical care time (minutes):  45   Critical care time was exclusive of:  Separately billable procedures and treating other patients   Critical care was necessary to treat or prevent imminent or life-threatening deterioration of the following conditions:  Sepsis and respiratory failure   Critical care was time spent personally by me on the following activities:  Development of treatment plan with patient or surrogate, discussions with consultants, evaluation of patient's response to treatment, examination of patient, ordering and review of laboratory studies, ordering and review of radiographic studies, ordering and performing treatments and interventions, pulse oximetry, re-evaluation of patient's condition and review of old charts   Care discussed with: admitting provider     Medications Ordered in the ED Medications  lactated ringers infusion ( Intravenous New Bag/Given 04/05/22 2310)  azithromycin (ZITHROMAX) 500 mg in sodium chloride 0.9 % 250 mL IVPB (500 mg Intravenous New Bag/Given 04/06/22 0034)  methylPREDNISolone sodium succinate (SOLU-MEDROL) 125 mg/2 mL injection 125 mg (has no administration in time range)  cefTRIAXone (ROCEPHIN) 1 g in sodium chloride 0.9 % 100 mL IVPB (0 g Intravenous Stopped 04/06/22 0028)  iohexol (OMNIPAQUE) 350 MG/ML injection 100 mL (100 mLs Intravenous Contrast Given 04/06/22 0023)    Initial Impression and Plan  Patient here with fever, tachycardia and general weakness. He had sepsis orders initiated prior to my evaluation. No definite bacterial source by history and no hypotension so  will hold off on empiric Abx pending identification of a source.   ED Course   Clinical Course as of 04/06/22 0111  Wed Apr 05, 2022  2338 CBC with normal WBC. Lactic acid is normal. CMP is unremarkable.  [CS]  2338 I personally viewed the images from radiology studies and agree with radiologist interpretation: CXR with likely LLL infiltrate. Given cough and fever will treat for CAP.  [CS]  Thu Apr 06, 2022  0004 Per RN, patient's SpO2 dropped, improved with [place] oxygen. He has prior history of PE, will send for CT to eval PE vs occult PNA.  [CS]  0007 Covid is positive. [CS]  0017 Patient is no longer on anticoagulation for prior PE. Also wife reports he is  not taking anything for HIV but that he does carry that diagnosis.  [CS]  0045 I personally viewed the images from radiology studies and agree with radiologist interpretation: CTA is neg for PE, confirms consolidation. Will discuss admission with Hospitalist.  [CS]  [id number] UA neg for infection.  [CS]  0110 Spoke with Dr. Clearence Ped, Hospitalist, who will evaluate the patient for admission.  [CS]    Clinical Course User Index [CS] Truddie Hidden, MD     MDM Rules/Calculators/A&P Medical Decision Making Problems Addressed: Acute respiratory failure with hypoxia [facility]): acute illness or injury that poses a threat to life or bodily functions Community acquired pneumonia of left lower lobe of lung: acute illness or injury that poses a threat to life or bodily functions COVID-19: acute illness or injury that poses a threat to life or bodily functions History of HIV infection ([place]): chronic illness or injury  Amount and/or Complexity of Data Reviewed Labs: ordered. Decision-making details documented in ED Course. Radiology: ordered and independent interpretation performed. Decision-making details documented in ED Course. ECG/medicine tests: ordered and independent interpretation performed. Decision-making details documented in ED  Course.  Risk Prescription drug management. Decision regarding hospitalization.     Final Clinical Impression(s) / ED Diagnoses Final diagnoses:  COVID-19  Acute respiratory failure with hypoxia ([place])  Community acquired pneumonia of left lower lobe of lung  History of HIV infection ([place])    Rx / DC Orders ED Discharge Orders     None        Truddie Hidden, MD 04/06/22 0111

## 2022-04-05 NOTE — ED Triage Notes (Signed)
Pt came home from work feeling sick today. Now has a fever of 103.2 and is coughing. Said he feels weak and not able to move around like normal.

## 2022-04-05 NOTE — ED Provider Notes (Signed)
MSE was initiated and I personally evaluated the patient and placed orders (if any) at  10:55 PM on April 05, 2022.  The patient appears stable so that the remainder of the MSE may be completed by another provider.  He is here with  general weakness and fall.  Found to be febrile by EMS.  He is tachycardic with a borderline low saturation.  He endorses chronic cough denies any vomiting diarrhea urinary symptoms.   Hayden Rasmussen, MD 04/06/22 [phone]

## 2022-04-06 ENCOUNTER — Emergency Department (HOSPITAL_COMMUNITY): Payer: Managed Care, Other (non HMO)

## 2022-04-06 ENCOUNTER — Encounter: Payer: Self-pay | Admitting: Radiology

## 2022-04-06 ENCOUNTER — Encounter (HOSPITAL_COMMUNITY): Payer: Self-pay | Admitting: Family Medicine

## 2022-04-06 DIAGNOSIS — Z886 Allergy status to analgesic agent status: Secondary | ICD-10-CM | POA: Diagnosis not present

## 2022-04-06 DIAGNOSIS — Z79899 Other long term (current) drug therapy: Secondary | ICD-10-CM | POA: Diagnosis not present

## 2022-04-06 DIAGNOSIS — J9601 Acute respiratory failure with hypoxia: Secondary | ICD-10-CM | POA: Diagnosis present

## 2022-04-06 DIAGNOSIS — J159 Unspecified bacterial pneumonia: Secondary | ICD-10-CM | POA: Diagnosis present

## 2022-04-06 DIAGNOSIS — K219 Gastro-esophageal reflux disease without esophagitis: Secondary | ICD-10-CM | POA: Diagnosis present

## 2022-04-06 DIAGNOSIS — Z86711 Personal history of pulmonary embolism: Secondary | ICD-10-CM | POA: Diagnosis not present

## 2022-04-06 DIAGNOSIS — R6520 Severe sepsis without septic shock: Secondary | ICD-10-CM | POA: Diagnosis present

## 2022-04-06 DIAGNOSIS — E669 Obesity, unspecified: Secondary | ICD-10-CM

## 2022-04-06 DIAGNOSIS — J189 Pneumonia, unspecified organism: Secondary | ICD-10-CM | POA: Diagnosis not present

## 2022-04-06 DIAGNOSIS — Z8616 Personal history of COVID-19: Secondary | ICD-10-CM | POA: Diagnosis not present

## 2022-04-06 DIAGNOSIS — U071 COVID-19: Secondary | ICD-10-CM | POA: Diagnosis present

## 2022-04-06 DIAGNOSIS — Z7901 Long term (current) use of anticoagulants: Secondary | ICD-10-CM | POA: Diagnosis not present

## 2022-04-06 DIAGNOSIS — Z6832 Body mass index (BMI) 32.0-32.9, adult: Secondary | ICD-10-CM | POA: Diagnosis not present

## 2022-04-06 DIAGNOSIS — J1282 Pneumonia due to coronavirus disease 2019: Secondary | ICD-10-CM | POA: Diagnosis present

## 2022-04-06 DIAGNOSIS — E1169 Type 2 diabetes mellitus with other specified complication: Secondary | ICD-10-CM | POA: Diagnosis not present

## 2022-04-06 DIAGNOSIS — A419 Sepsis, unspecified organism: Secondary | ICD-10-CM | POA: Insufficient documentation

## 2022-04-06 DIAGNOSIS — Z21 Asymptomatic human immunodeficiency virus [HIV] infection status: Secondary | ICD-10-CM | POA: Diagnosis present

## 2022-04-06 DIAGNOSIS — Z7984 Long term (current) use of oral hypoglycemic drugs: Secondary | ICD-10-CM | POA: Diagnosis not present

## 2022-04-06 DIAGNOSIS — Z8701 Personal history of pneumonia (recurrent): Secondary | ICD-10-CM | POA: Diagnosis not present

## 2022-04-06 DIAGNOSIS — I10 Essential (primary) hypertension: Secondary | ICD-10-CM | POA: Diagnosis present

## 2022-04-06 DIAGNOSIS — E119 Type 2 diabetes mellitus without complications: Secondary | ICD-10-CM | POA: Diagnosis present

## 2022-04-06 DIAGNOSIS — Y92003 Bedroom of unspecified non-institutional (private) residence as the place of occurrence of the external cause: Secondary | ICD-10-CM | POA: Diagnosis not present

## 2022-04-06 DIAGNOSIS — G9341 Metabolic encephalopathy: Secondary | ICD-10-CM | POA: Diagnosis present

## 2022-04-06 DIAGNOSIS — I4891 Unspecified atrial fibrillation: Secondary | ICD-10-CM | POA: Diagnosis present

## 2022-04-06 DIAGNOSIS — W06XXXA Fall from bed, initial encounter: Secondary | ICD-10-CM | POA: Diagnosis present

## 2022-04-06 DIAGNOSIS — A4189 Other specified sepsis: Secondary | ICD-10-CM | POA: Diagnosis present

## 2022-04-06 LAB — GLUCOSE, CAPILLARY
Glucose-Capillary: 153 mg/dL — ABNORMAL HIGH (ref 70–99)
Glucose-Capillary: 157 mg/dL — ABNORMAL HIGH (ref 70–99)

## 2022-04-06 LAB — PROCALCITONIN: Procalcitonin: 1.64 ng/mL

## 2022-04-06 LAB — CBG MONITORING, ED
Glucose-Capillary: 132 mg/dL — ABNORMAL HIGH (ref 70–99)
Glucose-Capillary: 186 mg/dL — ABNORMAL HIGH (ref 70–99)
Glucose-Capillary: 163 mg/dL — ABNORMAL HIGH (ref 70–99)
Glucose-Capillary: 238 mg/dL — ABNORMAL HIGH (ref 70–99)

## 2022-04-06 LAB — URINALYSIS, W/ REFLEX TO CULTURE (INFECTION SUSPECTED)
Bacteria, UA: NONE SEEN
Bilirubin Urine: NEGATIVE
Glucose, UA: NEGATIVE mg/dL
Ketones, ur: NEGATIVE mg/dL
Leukocytes,Ua: NEGATIVE
Nitrite: NEGATIVE
Protein, ur: >=300 mg/dL — AB
Specific Gravity, Urine: 1.018 (ref 1.005–1.030)
pH: 7 (ref 5.0–8.0)

## 2022-04-06 LAB — RESP PANEL BY RT-PCR (RSV, FLU A&B, COVID)  RVPGX2
Influenza A by PCR: NEGATIVE
Influenza B by PCR: NEGATIVE
Resp Syncytial Virus by PCR: NEGATIVE
SARS Coronavirus 2 by RT PCR: POSITIVE — AB

## 2022-04-06 MED ORDER — PREDNISONE 20 MG PO TABS
50.0000 mg | ORAL_TABLET | Freq: Every day | ORAL | Status: DC
  Administered 2022-04-09: 50 mg via ORAL
  Filled 2022-04-06: qty 1

## 2022-04-06 MED ORDER — CHLORHEXIDINE GLUCONATE CLOTH 2 % EX PADS
6.0000 | MEDICATED_PAD | Freq: Every day | CUTANEOUS | Status: DC
  Administered 2022-04-06 – 2022-04-08 (×3): 6 via TOPICAL

## 2022-04-06 MED ORDER — SODIUM CHLORIDE 0.9 % IV SOLN: 200.0000 mg | Freq: Once | INTRAVENOUS | Status: DC

## 2022-04-06 MED ORDER — IOHEXOL 350 MG/ML SOLN
100.0000 mL | Freq: Once | INTRAVENOUS | Status: AC | PRN
  Administered 2022-04-06: 100 mL via INTRAVENOUS

## 2022-04-06 MED ORDER — METHYLPREDNISOLONE SODIUM SUCC 125 MG IJ SOLR
125.0000 mg | Freq: Once | INTRAMUSCULAR | Status: AC
  Administered 2022-04-06: 125 mg via INTRAVENOUS
  Filled 2022-04-06: qty 2

## 2022-04-06 MED ORDER — SODIUM CHLORIDE 0.9 % IV SOLN
1.0000 g | INTRAVENOUS | Status: DC
  Administered 2022-04-06 – 2022-04-08 (×3): 1 g via INTRAVENOUS
  Filled 2022-04-06 (×3): qty 10

## 2022-04-06 MED ORDER — OXYCODONE HCL 5 MG PO TABS: 5.0000 mg | ORAL_TABLET | ORAL | Status: DC | PRN

## 2022-04-06 MED ORDER — LACTATED RINGERS IV SOLN: INTRAVENOUS | Status: AC

## 2022-04-06 MED ORDER — SODIUM CHLORIDE 0.9 % IV SOLN: 500.0000 mg | INTRAVENOUS | Status: DC

## 2022-04-06 MED ORDER — ONDANSETRON HCL 4 MG PO TABS: 4.0000 mg | ORAL_TABLET | Freq: Four times a day (QID) | ORAL | Status: DC | PRN

## 2022-04-06 MED ORDER — HEPARIN SODIUM (PORCINE) 5000 UNIT/ML IJ SOLN
5000.0000 [IU] | Freq: Three times a day (TID) | INTRAMUSCULAR | Status: DC
  Administered 2022-04-06 – 2022-04-09 (×10): 5000 [IU] via SUBCUTANEOUS
  Filled 2022-04-06 (×10): qty 1

## 2022-04-06 MED ORDER — SODIUM CHLORIDE 0.9 % IV SOLN
100.0000 mg | Freq: Every day | INTRAVENOUS | Status: DC
  Administered 2022-04-07 – 2022-04-08 (×2): 100 mg via INTRAVENOUS
  Filled 2022-04-06: qty 20
  Filled 2022-04-06: qty 100

## 2022-04-06 MED ORDER — INSULIN ASPART 100 UNIT/ML IJ SOLN
0.0000 [IU] | Freq: Three times a day (TID) | INTRAMUSCULAR | Status: DC
  Administered 2022-04-06 (×2): 3 [IU] via SUBCUTANEOUS
  Administered 2022-04-06: 5 [IU] via SUBCUTANEOUS
  Administered 2022-04-07: 3 [IU] via SUBCUTANEOUS
  Administered 2022-04-07 – 2022-04-09 (×7): 2 [IU] via SUBCUTANEOUS
  Filled 2022-04-06 (×2): qty 1

## 2022-04-06 MED ORDER — ZINC SULFATE 220 (50 ZN) MG PO CAPS
220.0000 mg | ORAL_CAPSULE | Freq: Every day | ORAL | Status: DC
  Administered 2022-04-06 – 2022-04-09 (×4): 220 mg via ORAL
  Filled 2022-04-06 (×4): qty 1

## 2022-04-06 MED ORDER — FAMOTIDINE 20 MG PO TABS
20.0000 mg | ORAL_TABLET | Freq: Every day | ORAL | Status: DC
  Administered 2022-04-06 – 2022-04-09 (×4): 20 mg via ORAL
  Filled 2022-04-06 (×4): qty 1

## 2022-04-06 MED ORDER — ALBUTEROL SULFATE HFA 108 (90 BASE) MCG/ACT IN AERS: 2.0000 | INHALATION_SPRAY | RESPIRATORY_TRACT | Status: DC | PRN

## 2022-04-06 MED ORDER — VITAMIN C 500 MG PO TABS
500.0000 mg | ORAL_TABLET | Freq: Every day | ORAL | Status: DC
  Administered 2022-04-06 – 2022-04-09 (×4): 500 mg via ORAL
  Filled 2022-04-06 (×4): qty 1

## 2022-04-06 MED ORDER — METHYLPREDNISOLONE SODIUM SUCC 125 MG IJ SOLR
1.0000 mg/kg | Freq: Two times a day (BID) | INTRAMUSCULAR | Status: AC
  Administered 2022-04-06 – 2022-04-08 (×6): 113.75 mg via INTRAVENOUS
  Filled 2022-04-06 (×6): qty 2

## 2022-04-06 MED ORDER — GUAIFENESIN-DM 100-10 MG/5ML PO SYRP: 10.0000 mL | ORAL_SOLUTION | ORAL | Status: DC | PRN

## 2022-04-06 MED ORDER — ONDANSETRON HCL 4 MG/2ML IJ SOLN: 4.0000 mg | Freq: Four times a day (QID) | INTRAMUSCULAR | Status: DC | PRN

## 2022-04-06 MED ORDER — SODIUM CHLORIDE 0.9 % IV SOLN
100.0000 mg | Freq: Two times a day (BID) | INTRAVENOUS | Status: DC
  Administered 2022-04-06 – 2022-04-09 (×6): 100 mg via INTRAVENOUS
  Filled 2022-04-06 (×10): qty 100

## 2022-04-06 MED ORDER — HYDROCOD POLI-CHLORPHE POLI ER 10-8 MG/5ML PO SUER
5.0000 mL | Freq: Two times a day (BID) | ORAL | Status: DC | PRN
  Administered 2022-04-07: 5 mL via ORAL
  Filled 2022-04-06: qty 5

## 2022-04-06 MED ORDER — PANTOPRAZOLE SODIUM 40 MG PO TBEC
40.0000 mg | DELAYED_RELEASE_TABLET | Freq: Every day | ORAL | Status: DC
  Administered 2022-04-06 – 2022-04-09 (×4): 40 mg via ORAL
  Filled 2022-04-06 (×4): qty 1

## 2022-04-06 MED ORDER — SODIUM CHLORIDE 0.9 % IV SOLN
100.0000 mg | Freq: Once | INTRAVENOUS | Status: AC
  Administered 2022-04-06: 100 mg via INTRAVENOUS
  Filled 2022-04-06: qty 20

## 2022-04-06 MED ORDER — SODIUM CHLORIDE 0.9 % IV SOLN: 100.0000 mg | Freq: Every day | INTRAVENOUS | Status: DC

## 2022-04-06 MED ORDER — INSULIN ASPART 100 UNIT/ML IJ SOLN: 0.0000 [IU] | Freq: Every day | INTRAMUSCULAR | Status: DC

## 2022-04-06 MED ORDER — ACETAMINOPHEN 325 MG PO TABS
650.0000 mg | ORAL_TABLET | Freq: Four times a day (QID) | ORAL | Status: DC | PRN
  Administered 2022-04-06 – 2022-04-07 (×3): 650 mg via ORAL
  Filled 2022-04-06 (×3): qty 2

## 2022-04-06 NOTE — Assessment & Plan Note (Signed)
-   Continue Pepcid and Protonix

## 2022-04-06 NOTE — H&P (Signed)
History and Physical    Patient: Jose Lamb [id number] DOB: 1960/10/21 DOA: 04/05/2022 DOS: the patient was seen and examined on 04/06/2022 PCP: [name], [name]  Patient coming from: Home  Chief Complaint:  Chief Complaint  Patient presents with   Fever   HPI: Jose Lamb is a 62 y.o. male with medical history significant of atrial fibrillation, essential hypertension, HIV, history of PE with last COVID infection, and more presents the ED with a chief complaint of cold.  Patient reports he is just been so cold.  He has had intermittent episodes of confusion during his stay in the ED, just had to be moved to different room so that he could be watched more closely.  During my exam he is very somnolent.  It is difficult to get him to answer questions.  He reports that he does have COVID.  He is not sure when his symptoms started.  He has a cough that is productive of yellow sputum.  He describes it as a "extreme" cough.  He reports he has felt feverish.  He denies chest pain.  Patient does not provide more history than that.  He was able to tell other hospital staff that he was so weak that his legs gave out and he had a fall.  Patient is overtly falling asleep during my exam and not answering all questions appropriately.  Patient does not smoke, does not drink.  He is vaccinated for COVID.  Patient is full code. Review of Systems: As mentioned in the history of present illness. All other systems reviewed and are negative. Past Medical History:  Diagnosis Date   Atrial fibrillation [facility])    In the setting of hyperthyroidism   Bilateral pulmonary embolism [facility])    Diagnosed August 2021   Essential hypertension    HIV antibody positive ([place])    Perforated duodenal ulcer ([place]) 2015   Contained without need for surgical intervention   Pneumonia due to COVID-19 virus    Diagnosed August 2021   Past Surgical History:  Procedure Laterality Date   COLONOSCOPY  Sept 2015    [place]: transverse colon polyp with pathology reporting lymphoid nodule    ESOPHAGOGASTRODUODENOSCOPY N/A 03/30/2014   Procedure: ESOPHAGOGASTRODUODENOSCOPY (EGD);  Surgeon: Daneil Dolin, MD;  Location: [facility] ENDO SUITE;  Service: Endoscopy;  Laterality: N/A;  [id number]   UMBILICAL HERNIA REPAIR N/A 12/16/2021   Procedure: HERNIA REPAIR UMBILICAL ADULT WITH MESH;  Surgeon: Aviva Signs, MD;  Location: [facility] ORS;  Service: General;  Laterality: N/A;   Social History:  reports that he has never smoked. He has never used smokeless tobacco. He reports that he does not drink alcohol and does not use drugs.  Allergies  Allergen Reactions   Asa [Aspirin] Other (See Comments)    Nose bleeds    Family History  Problem Relation Age of Onset   Ulcers Mother    Heart Problems Mother    Colon cancer Neg Hx     Prior to Admission medications   Medication Sig Start Date End Date Taking? Authorizing Provider  albuterol (PROVENTIL) (2.5 MG/3ML) 0.083% nebulizer solution Take 2.5 mg by nebulization every 4 (four) hours. 12/01/21   [provider]  albuterol (VENTOLIN HFA) 108 (90 Base) MCG/ACT inhaler 1 puff every 4 (four) hours as needed for wheezing or shortness of breath. 10/23/19   [provider]  famotidine (PEPCID) 20 MG tablet One after supper Patient taking differently: Take 20 mg by mouth daily. One after  supper 01/08/20   Tanda Rockers, MD  furosemide (LASIX) 40 MG tablet Take 40 mg by mouth 2 (two) times daily as needed for fluid. 10/07/19   [provider]  LANTUS SOLOSTAR 100 UNIT/ML Solostar Pen 16 Units daily. 10/20/19   [provider]  oxyCODONE (ROXICODONE) 5 MG immediate release tablet Take 1 tablet (5 mg total) by mouth every 6 (six) hours as needed. 12/17/21 12/17/22  Aviva Signs, MD  pantoprazole (PROTONIX) 40 MG tablet Take 1 tablet (40 mg total) by mouth daily. Take 30-60 min before first meal of the day 01/08/20   Tanda Rockers, MD  Potassium  Chloride ER 20 MEQ TBCR Take 1 tablet by mouth daily at 6 (six) AM. 12/17/21   Barton Dubois, MD  XARELTO 20 MG TABS tablet Take 20 mg by mouth daily. 12/01/21   [provider]    Physical Exam: Vitals:   04/06/22 0248 04/06/22 0330 04/06/22 0400 04/06/22 0430  BP:  120/76 120/78 119/76  Pulse:  99 95 89  Resp:  (!) 37 (!) 34 (!) 34  Temp: (!) 100.7 F (38.2 C)     TempSrc: Oral     SpO2:  95% 95% 97%  Weight:      Height:       1.  General: Patient lying supine in bed,  no acute distress   2. Psychiatric: Alert and oriented x 3, mood and behavior normal for situation, pleasant and cooperative with exam   3. Neurologic: Speech and language are normal, face is symmetric, moves all 4 extremities voluntarily, at baseline without acute deficits on limited exam   4. HEENMT:  Head is atraumatic, normocephalic, pupils reactive to light, neck is supple, trachea is midline, mucous membranes are moist   5. Respiratory : Diminished in the left lower lung field without wheezing, rhonchi, rales, no cyanosis, no increase in work of breathing or accessory muscle use   6. Cardiovascular : Heart rate normal, rhythm is regular, no murmurs, rubs or gallops, no peripheral edema, peripheral pulses palpated   7. Gastrointestinal:  Abdomen is soft, nondistended, nontender to palpation bowel sounds active, no masses or organomegaly palpated   8. Skin:  Skin is warm, dry and intact without rashes, acute lesions, or ulcers on limited exam   9.Musculoskeletal:  No acute deformities or trauma, no asymmetry in tone, no peripheral edema, peripheral pulses palpated, no tenderness to palpation in the extremities  Data Reviewed: All labs and imaging were reviewed by me.  Patient is COVID-positive.  Blood cultures pending.  Chest x-ray shows possible superimposed bacterial pneumonia.  CT scan shows a lobar pneumonia as well.  Rocephin and Zithromax started in the ED, continued with Rocephin and  Zithromax.  Patient has been given Tylenol for fever and was diaphoretic at time of my exam.  Admission was requested for acute respiratory failure with hypoxia.  Assessment and Plan: * Acute respiratory failure with hypoxia (HCC) - O2 sat down to 84% on room air - Not requiring 2 L nasal cannula to maintain oxygen sats in the mid 90s - COVID-positive - Chest x-ray shows basilar pneumonia - Procalcitonin is positive - Continue Rocephin and Zithromax - Blood cultures pending - EKG shows a prolonged QT but no acute ischemic changes - Continue to monitor  COVID-19 virus infection - COVID-positive - Previously had PE with COVID - CTA chest today shows no pulmonary embolism, pulmonary arterial hypertension, global cardiomegaly and peripheral consolidation within the left lower lobe -  Continue remdesivir - Continue supportive care - Treat superimposed bacterial pneumonia - Continue to monitor  Sepsis due to pneumonia ([place]) - Temp 100.7, heart rate 110s-116, respiratory rate 24-41, with acute metabolic encephalopathy - Blood cultures pending - Chest x-ray shows left basilar pneumonia which is confirmed with CT scan - COVID-positive as well - Continue Rocephin and Zithromax - Continue supportive care for COVID - Continue to monitor  Acute metabolic encephalopathy - Waxing and waning confusion with episodes of taking IV out and becoming agitated - Likely secondary to sepsis - At the time of my exam patient is somnolent, but oriented - Reorient patient as needed, encouraged rest, continue to monitor  GERD (gastroesophageal reflux disease) - Continue Pepcid and Protonix  Diabetes mellitus type 2 in obese (HCC) - Basal insulin at 16 units at baseline - Continue with sliding scale coverage - Monitor CBGs -      Advance Care Planning:   Code Status: Full Code  Consults: None at this time  Family Communication: No family at bedside  Severity of Illness: The appropriate  patient status for this patient is INPATIENT. Inpatient status is judged to be reasonable and necessary in order to provide the required intensity of service to ensure the patient's safety. The patient's presenting symptoms, physical exam findings, and initial radiographic and laboratory data in the context of their chronic comorbidities is felt to place them at high risk for further clinical deterioration. Furthermore, it is not anticipated that the patient will be medically stable for discharge from the hospital within 2 midnights of admission.   * I certify that at the point of admission it is my clinical judgment that the patient will require inpatient hospital care spanning beyond 2 midnights from the point of admission due to high intensity of service, high risk for further deterioration and high frequency of surveillance required.*  Author: Rolla Plate, DO 04/06/2022 5:00 AM  For on call review [email or web address].

## 2022-04-06 NOTE — Assessment & Plan Note (Addendum)
-   At time of presentation in the setting of acute pneumonia with mild hypoxia most likely. -Mentation is now improving back to normal -Patient demonstrated to be oriented x 3 and following commands appropriately. -Continue to follow clinical response.

## 2022-04-06 NOTE — Assessment & Plan Note (Addendum)
-   O2 sat down to 84% on room air at time of admission; currently no longer requiring oxygen supplementation. - COVID-positive and Chest x-ray showing basilar pneumonia - Procalcitonin elevated suggesting presence of bacterial infection as well.   - Excellent response to the use of remdesivir, steroids and IV antibiotics -At discharge not requiring oxygen supplementation, demonstrating significant improvement in symptoms and speaking in full sentences. - So far blood cultures without any growth. - Patient discharged on oral cefdinir and doxycycline to complete antibiotic therapy. -Approximate and adequate hydration and to follow-up with PCP in 10 days.

## 2022-04-06 NOTE — Assessment & Plan Note (Signed)
-   COVID-positive - Previously had PE with COVID - CTA chest today shows no pulmonary embolism, pulmonary arterial hypertension, global cardiomegaly and peripheral consolidation within the left lower lobe - Continue remdesivir - Continue supportive care - Treat superimposed bacterial pneumonia - Continue to monitor

## 2022-04-06 NOTE — ED Notes (Signed)
Pt was confused, he pulled iv out and was urinating in the trash can in room. Charge nurse notified and pt moved to room where he is more visible.

## 2022-04-06 NOTE — Progress Notes (Signed)
Patient seen and examined; admitted after midnight secondary to URI symptoms, coughing spells fever and general malaise.  Found to be positive for COVID in the ED chest x-ray demonstrating bilateral pulmonary infiltrates and atelectatic changes.  Hemodynamically stable.  Currently afebrile.  Please refer to H&P written by Dr. Clearence Ped for further info/details on admission.  Plan: -Continue to follow inflammatory markers -Continue treatment with remdesivir, steroids and antibiotics (patient with elevated procalcitonin and high risk for concomitant bacterial infection). -Continue antitussive medications, as needed bronchodilators and supportive care. -Follow clinical response.  Barton Dubois MD [phone]

## 2022-04-06 NOTE — Progress Notes (Signed)
  Transition of Care [facility]) Screening Note   Patient Details  Name: Jose Lamb Date of Birth: 04/07/1960   Transition of Care [facility]) CM/SW Contact:    Ihor Gully, LCSW Phone Number: 04/06/2022, 2:14 PM    Transition of Care Department [facility]) has reviewed patient and no TOC needs have been identified at this time. We will continue to monitor patient advancement through interdisciplinary progression rounds. If new patient transition needs arise, please place a TOC consult.

## 2022-04-06 NOTE — Assessment & Plan Note (Addendum)
-   Patient met criteria for sepsis at time of admission with heart rate 110s-116, respiratory rate 24-41, with acute metabolic encephalopathy and hypothermia - Blood cultures not demonstrating any growth at the moment. - Chest x-ray shows left basilar pneumonia which is confirmed with CT scan - COVID-positive as well - Continue treatment with current antibiotics, adequate hydration, steroids, remdesivir, bronchodilator, antitussive medications as needed antipyretics and Bair hugger. -Follow clinical response.

## 2022-04-06 NOTE — Assessment & Plan Note (Addendum)
-   Resolved modified carbohydrate diet -Patient advised to continue weight loss management -Outpatient follow-up with PCP to further assess CBG fluctuation and determine the need to initiate hypoglycemic regimen.

## 2022-04-06 NOTE — Progress Notes (Signed)
   04/06/22 1721  Vitals  Temp (!) 95.3 F (35.2 C)  Temp Source Rectal  MEWS COLOR  MEWS Score Color Green  MEWS Score  MEWS Temp 1  MEWS Systolic 0  MEWS Pulse 0  MEWS RR 0  MEWS LOC 0  MEWS Score 1  Provider Notification  Provider Name/Title Dr. Dyann Kief  Date Provider Notified 04/06/22  Time Provider Notified 1725  Method of Notification Page  Notification Reason Change in status (rectal temp 95.3)  Provider response Other (Comment) (give tylenol and recheck)  Date of Provider Response 04/06/22  Time of Provider Response 1739

## 2022-04-07 DIAGNOSIS — G9341 Metabolic encephalopathy: Secondary | ICD-10-CM | POA: Diagnosis not present

## 2022-04-07 DIAGNOSIS — J189 Pneumonia, unspecified organism: Secondary | ICD-10-CM | POA: Diagnosis not present

## 2022-04-07 DIAGNOSIS — J1282 Pneumonia due to coronavirus disease 2019: Secondary | ICD-10-CM

## 2022-04-07 DIAGNOSIS — J9601 Acute respiratory failure with hypoxia: Secondary | ICD-10-CM | POA: Diagnosis not present

## 2022-04-07 DIAGNOSIS — U071 COVID-19: Secondary | ICD-10-CM | POA: Diagnosis not present

## 2022-04-07 LAB — COMPREHENSIVE METABOLIC PANEL
ALT: 18 U/L (ref 0–44)
AST: 24 U/L (ref 15–41)
Albumin: 3.1 g/dL — ABNORMAL LOW (ref 3.5–5.0)
Alkaline Phosphatase: 44 U/L (ref 38–126)
Anion gap: 7 (ref 5–15)
BUN: 23 mg/dL — ABNORMAL HIGH (ref 6–20)
CO2: 25 mmol/L (ref 22–32)
Calcium: 8.3 mg/dL — ABNORMAL LOW (ref 8.9–10.3)
Chloride: 110 mmol/L (ref 98–111)
Creatinine, Ser: 0.74 mg/dL (ref 0.61–1.24)
GFR, Estimated: >60 mL/min (ref >60)
Glucose, Bld: 154 mg/dL — ABNORMAL HIGH (ref 70–99)
Potassium: 3.6 mmol/L (ref 3.5–5.1)
Sodium: 142 mmol/L (ref 135–145)
Total Bilirubin: 0.5 mg/dL (ref 0.3–1.2)
Total Protein: 7.5 g/dL (ref 6.5–8.1)

## 2022-04-07 LAB — CBC WITH DIFFERENTIAL/PLATELET
Abs Immature Granulocytes: 0.09 10*3/uL — ABNORMAL HIGH (ref 0.00–0.07)
Basophils Absolute: 0 10*3/uL (ref 0.0–0.1)
Basophils Relative: 0 %
Eosinophils Absolute: 0 10*3/uL (ref 0.0–0.5)
Eosinophils Relative: 0 %
HCT: 37.2 % — ABNORMAL LOW (ref 39.0–52.0)
Hemoglobin: 11.7 g/dL — ABNORMAL LOW (ref 13.0–17.0)
Immature Granulocytes: 1 %
Lymphocytes Relative: 11 %
Lymphs Abs: 1.1 10*3/uL (ref 0.7–4.0)
MCH: 28.7 pg (ref 26.0–34.0)
MCHC: 31.5 g/dL (ref 30.0–36.0)
MCV: 91.4 fL (ref 80.0–100.0)
Monocytes Absolute: 0.3 10*3/uL (ref 0.1–1.0)
Monocytes Relative: 3 %
Neutro Abs: 8.3 10*3/uL — ABNORMAL HIGH (ref 1.7–7.7)
Neutrophils Relative %: 85 %
Platelets: 117 10*3/uL — ABNORMAL LOW (ref 150–400)
RBC: 4.07 MIL/uL — ABNORMAL LOW (ref 4.22–5.81)
RDW: 14.6 % (ref 11.5–15.5)
WBC: 9.8 10*3/uL (ref 4.0–10.5)
nRBC: 0 % (ref 0.0–0.2)

## 2022-04-07 LAB — CD4/CD8 (T-HELPER/T-SUPPRESSOR CELL)
CD4 absolute: <35 /uL — ABNORMAL LOW (ref 400–1790)
CD4%: 1.86 % — ABNORMAL LOW (ref 33–65)
CD8 T Cell Abs: 637 /uL (ref 190–1000)
CD8tox: 68.74 % — ABNORMAL HIGH (ref 12–40)
Ratio: 0.03 — ABNORMAL LOW (ref 1.0–3.0)
Total lymphocyte count: 926 /uL — ABNORMAL LOW (ref 1000–4000)

## 2022-04-07 LAB — GLUCOSE, CAPILLARY
Glucose-Capillary: 161 mg/dL — ABNORMAL HIGH (ref 70–99)
Glucose-Capillary: 148 mg/dL — ABNORMAL HIGH (ref 70–99)
Glucose-Capillary: 126 mg/dL — ABNORMAL HIGH (ref 70–99)
Glucose-Capillary: 153 mg/dL — ABNORMAL HIGH (ref 70–99)

## 2022-04-07 LAB — MRSA NEXT GEN BY PCR, NASAL: MRSA by PCR Next Gen: NOT DETECTED

## 2022-04-07 LAB — MAGNESIUM: Magnesium: 2.4 mg/dL (ref 1.7–2.4)

## 2022-04-07 MED ORDER — PROSOURCE PLUS PO LIQD
30.0000 mL | Freq: Three times a day (TID) | ORAL | Status: DC
  Administered 2022-04-07 – 2022-04-09 (×6): 30 mL via ORAL
  Filled 2022-04-07 (×5): qty 30

## 2022-04-07 MED ORDER — CHLORHEXIDINE GLUCONATE CLOTH 2 % EX PADS: 6.0000 | MEDICATED_PAD | Freq: Every day | CUTANEOUS | Status: DC

## 2022-04-07 NOTE — Assessment & Plan Note (Signed)
-  Continue treatment with steroids, remdesivir, antitussive medication and as needed bronchodilators. -Maintain adequate hydration and follow clinical response. -Currently not requiring oxygen supplementation demonstrating good saturation on room air.

## 2022-04-07 NOTE — Progress Notes (Signed)
Patient given flutter valve and educated on proper use. Additional instructions left at bedside with patient. All questions answered. Patient observed attempting use of flutter valve appropriately.

## 2022-04-07 NOTE — Progress Notes (Signed)
   04/07/22 0030  Assess: MEWS Score  Temp (!) 95.6 F (35.3 C)  BP 100/65  MAP (mmHg) 77  Pulse Rate 67  Resp 18  SpO2 96 %  O2 Device Room Air  Assess: MEWS Score  MEWS Temp 1  MEWS Systolic 1  MEWS Pulse 0  MEWS RR 0  MEWS LOC 0  MEWS Score 2  MEWS Score Color Yellow  Assess: if the MEWS score is Yellow or Red  Were vital signs taken at a resting state? Yes  Focused Assessment No change from prior assessment  Does the patient meet 2 or more of the SIRS criteria? No  MEWS guidelines implemented  Yes, yellow  Treat  MEWS Interventions Considered administering scheduled or prn medications/treatments as ordered  Take Vital Signs  Increase Vital Sign Frequency  Yellow: Q2hr x1, continue Q4hrs until patient remains green for 12hrs  Escalate  MEWS: Escalate Yellow: Discuss with charge nurse and consider notifying provider and/or RRT  Notify: Charge Nurse/RN  Name of Charge Nurse/RN Notified Tina, RN  Provider Notification  Provider Name/Title Dr. Josph Macho  Date Provider Notified 04/07/22  Time Provider Notified 0025  Method of Notification Page  Notification Reason Change in status (rectal temp 95.6)  Provider response Other (Comment) (place warm blankets, give prn tylenol)  Date of Provider Response 04/07/22  Time of Provider Response 0032  Assess: SIRS CRITERIA  SIRS Temperature  1  SIRS Pulse 0  SIRS Respirations  0  SIRS WBC 0  SIRS Score Sum  1

## 2022-04-07 NOTE — Progress Notes (Signed)
Dr. Josph Macho notified of patient's rectal temp, ordered to administer tylenol and warm blankets. Rectal temperature rechecked after 30 minutes, temp is 95.9. Nurse notified Dr. Josph Macho, orders placed to transfer patient to ICU stepdown.

## 2022-04-07 NOTE — Progress Notes (Signed)
Progress Note   Patient: Jose Lamb [id number] DOB: 1960-04-17 DOA: 04/05/2022     1 DOS: the patient was seen and examined on 04/07/2022   Brief hospital course: As per H&P written by Dr.Zierle-Ghosh on 04/06/22 Jose Lamb is a 62 y.o. male with medical history significant of atrial fibrillation, essential hypertension, HIV, history of PE with last COVID infection, and more presents the ED with a chief complaint of cold.  Patient reports he is just been so cold.  He has had intermittent episodes of confusion during his stay in the ED, just had to be moved to different room so that he could be watched more closely.  During my exam he is very somnolent.  It is difficult to get him to answer questions.  He reports that he does have COVID.  He is not sure when his symptoms started.  He has a cough that is productive of yellow sputum.  He describes it as a "extreme" cough.  He reports he has felt feverish.  He denies chest pain.  Patient does not provide more history than that.   He was able to tell other hospital staff that he was so weak that his legs gave out and he had a fall.  Patient is overtly falling asleep during my exam and not answering all questions appropriately.   Patient does not smoke, does not drink.  He is vaccinated for COVID.  Patient is full code. Assessment and Plan: * Acute respiratory failure with hypoxia (HCC) - O2 sat down to 84% on room air at time of admission; currently no longer requiring oxygen supplementation. - COVID-positive and Chest x-ray showing basilar pneumonia - Procalcitonin elevated suggesting presence of bacterial infection as well.   - Continue Rocephin and Zithromax - So far blood cultures without any growth. - Continue supportive care and follow clinical response.  Sepsis due to pneumonia [facility]) - Patient met criteria for sepsis at time of admission with heart rate 110s-116, respiratory rate 24-41, with acute metabolic encephalopathy and  hypothermia - Blood cultures not demonstrating any growth at the moment. - Chest x-ray shows left basilar pneumonia which is confirmed with CT scan - COVID-positive as well - Continue treatment with current antibiotics, adequate hydration, steroids, remdesivir, bronchodilator, antitussive medications as needed antipyretics and Bair hugger. -Follow clinical response.  Acute metabolic encephalopathy - At time of presentation in the setting of acute pneumonia with mild hypoxia most likely -Mentation is now improving back to normal -Patient demonstrated to be oriented x 3 and following commands appropriately. -Continue to follow clinical response.  GERD (gastroesophageal reflux disease) - Continue Pepcid and Protonix  Diabetes mellitus type 2 in obese (HCC) - Continue long-acting and sliding scale insulin -The use of his steroids most likely contributing to elevated CBGs.  Follow trend and adjust treatment as needed.  Pneumonia due to COVID-19 virus -Continue treatment with steroids, remdesivir, antitussive medication and as needed bronchodilators. -Maintain adequate hydration and follow clinical response. -Currently not requiring oxygen supplementation demonstrating good saturation on room air.  COVID-19 virus infection-resolved as of 04/07/2022 - COVID-positive - Previously had PE with COVID - CTA chest today shows no pulmonary embolism, pulmonary arterial hypertension, global cardiomegaly and peripheral consolidation within the left lower lobe - Continue remdesivir - Continue supportive care - Treat superimposed bacterial pneumonia - Continue to monitor   Subjective:  Complaining of short winded sensation with activity, intermittent coughing spells and general malaise.  Overnight experience hypothermia requiring the use of Bair hugger.  No  requiring oxygen supplementation.  Physical Exam: Vitals:   04/07/22 1300 04/07/22 1400 04/07/22 1500 04/07/22 1600  BP: 122/69 (!) 117/96  123/88 (!) 118/92  Pulse: 86 91 86 78  Resp: (!) 28 16 (!) 23 (!) 25  Temp:      TempSrc:      SpO2: 96% 95% 93% 97%  Weight:      Height:       General exam: Alert, awake, oriented x 3; feeling better; still with ongoing productive coughing spells and short winded sensation with activity.  Hypothermic requiring Bair hugger overnight. Respiratory system: Good saturation on room air; no using accessory muscles.  Positive rhonchi bilaterally. Cardiovascular system:RRR. No rubs or gallops; no JVD. Gastrointestinal system: Abdomen is obese, nondistended, soft and nontender. No organomegaly or masses felt. Normal bowel sounds heard. Central nervous system: Alert and oriented. No focal neurological deficits. Extremities: No cyanosis or clubbing. Skin: No petechiae. Psychiatry: Judgement and insight appear normal. Mood & affect appropriate.   Data Reviewed: MRSA PCR: Negative Comprehensive metabolic panel: Sodium [id number], potassium 3.6, chloride 110, bicarb 25, BUN 23, creatinine 0.74 and normal LFTs. CBC: WBC 9.8, hemoglobin 11.7 and platelet count 117 K Magnesium: 214  Family Communication: Wife updated over the phone.  Disposition: Status is: Inpatient Remains inpatient appropriate because: Continue treatment for sepsis due to pneumonia.    Planned Discharge Destination: Home   Time spent: 50 minutes  Author: Barton Dubois, MD 04/07/2022 4:09 PM  For on call review [email or web address].

## 2022-04-07 NOTE — Progress Notes (Signed)
Initial Nutrition Assessment  DOCUMENTATION CODES:      INTERVENTION:  Heart Healthy diet   ProSource 30 ml TID (each 30 ml provides 100 kcal, 15 gr protein)   Education -addressed patient's diet related questions  NUTRITION DIAGNOSIS:   Increased nutrient needs related to acute illness (COVID positive) as evidenced by estimated needs.   GOAL:  Patient will meet greater than or equal to 90% of their needs  MONITOR:  PO intake, Supplement acceptance, Labs, Weight trends  REASON FOR ASSESSMENT:   Malnutrition Screening Tool Assessment of nutrition requirement/status  ASSESSMENT: Patient is a 62 yo male with HIV, HTN, DM who presents positive for COVID infection.  Hypothermia today per nursing 95.9 (rectal temperature). Current temperature-98.2 '@1234'$ .   Meal intake today 100% of breakfast and able to feed himself. He is eating lunch during RD visit and affirms good appetite. Lives with is wife and is still working (drives some type of equipment). He often eats sandwich for breakfast and break.   Weight history stable the pasts 4 months. In January of 2023 patient weighed a reported 117 kg. Currently 106.1 kg. Overall loss (~10%) over the past 13 months. Patient says, he is not eating as much as he used to.   Medications: vitamin C, Pepcid, Insulin, Protonix and zinc.      Latest Ref Rng & Units 04/07/2022    4:30 AM 04/05/2022   11:02 PM 01/19/2022    1:02 PM  BMP  Glucose 70 - 99 mg/dL 154  133  89   BUN 6 - 20 mg/dL '23  15  10   '$ Creatinine 0.61 - 1.24 mg/dL 0.74  1.05  0.75   Sodium 135 - 145 mmol/L 142  140  138   Potassium 3.5 - 5.1 mmol/L 3.6  3.5  3.6   Chloride 98 - 111 mmol/L 110  104  104   CO2 22 - 32 mmol/L '25  25  29   '$ Calcium 8.9 - 10.3 mg/dL 8.3  8.9  8.8       NUTRITION - FOCUSED PHYSICAL EXAM: Deferred today - patient is eating lunch  Diet Order:   Diet Order             Diet Heart Room service appropriate? Yes; Fluid consistency: Thin  Diet  effective now                   EDUCATION NEEDS:  Education needs have been addressed  Skin:  Skin Assessment: Reviewed RN Assessment  Last BM:  3/1  Height:   Ht Readings from Last 1 Encounters:  04/07/22 '5\' 11"'$  (1.803 m)    Weight:   Wt Readings from Last 1 Encounters:  04/07/22 106.1 kg    Ideal Body Weight:   78 kg  BMI:  Body mass index is 32.62 kg/m.  Estimated Nutritional Needs:   Kcal:  2300-2400  Protein:  117-125 gr  Fluid:  > 2 liters daily   Colman Cater MS,RD,CSG,LDN Contact: Shea Evans

## 2022-04-07 NOTE — Progress Notes (Signed)
  Transition of Care [facility]) Screening Note   Patient Details  Name: Jose Lamb Date of Birth: Sep 25, 1960   Transition of Care [facility]) CM/SW Contact:    Ihor Gully, LCSW Phone Number: 04/07/2022, 11:39 AM    Transition of Care Department [facility]) has reviewed patient and no TOC needs have been identified at this time. We will continue to monitor patient advancement through interdisciplinary progression rounds. If new patient transition needs arise, please place a TOC consult.

## 2022-04-08 DIAGNOSIS — J9601 Acute respiratory failure with hypoxia: Secondary | ICD-10-CM | POA: Diagnosis not present

## 2022-04-08 DIAGNOSIS — U071 COVID-19: Secondary | ICD-10-CM | POA: Diagnosis not present

## 2022-04-08 DIAGNOSIS — G9341 Metabolic encephalopathy: Secondary | ICD-10-CM | POA: Diagnosis not present

## 2022-04-08 DIAGNOSIS — J189 Pneumonia, unspecified organism: Secondary | ICD-10-CM | POA: Diagnosis not present

## 2022-04-08 LAB — GLUCOSE, CAPILLARY
Glucose-Capillary: 130 mg/dL — ABNORMAL HIGH (ref 70–99)
Glucose-Capillary: 167 mg/dL — ABNORMAL HIGH (ref 70–99)
Glucose-Capillary: 148 mg/dL — ABNORMAL HIGH (ref 70–99)
Glucose-Capillary: 173 mg/dL — ABNORMAL HIGH (ref 70–99)

## 2022-04-08 LAB — CBC WITH DIFFERENTIAL/PLATELET
Abs Immature Granulocytes: 0.05 10*3/uL (ref 0.00–0.07)
Basophils Absolute: 0 10*3/uL (ref 0.0–0.1)
Basophils Relative: 0 %
Eosinophils Absolute: 0 10*3/uL (ref 0.0–0.5)
Eosinophils Relative: 0 %
HCT: 36.8 % — ABNORMAL LOW (ref 39.0–52.0)
Hemoglobin: 11.7 g/dL — ABNORMAL LOW (ref 13.0–17.0)
Immature Granulocytes: 1 %
Lymphocytes Relative: 12 %
Lymphs Abs: 1.1 10*3/uL (ref 0.7–4.0)
MCH: 29.1 pg (ref 26.0–34.0)
MCHC: 31.8 g/dL (ref 30.0–36.0)
MCV: 91.5 fL (ref 80.0–100.0)
Monocytes Absolute: 0.2 10*3/uL (ref 0.1–1.0)
Monocytes Relative: 3 %
Neutro Abs: 7.9 10*3/uL — ABNORMAL HIGH (ref 1.7–7.7)
Neutrophils Relative %: 84 %
Platelets: 128 10*3/uL — ABNORMAL LOW (ref 150–400)
RBC: 4.02 MIL/uL — ABNORMAL LOW (ref 4.22–5.81)
RDW: 14.7 % (ref 11.5–15.5)
WBC: 9.3 10*3/uL (ref 4.0–10.5)
nRBC: 0 % (ref 0.0–0.2)

## 2022-04-08 LAB — COMPREHENSIVE METABOLIC PANEL
ALT: 17 U/L (ref 0–44)
AST: 19 U/L (ref 15–41)
Albumin: 2.9 g/dL — ABNORMAL LOW (ref 3.5–5.0)
Alkaline Phosphatase: 40 U/L (ref 38–126)
Anion gap: 7 (ref 5–15)
BUN: 25 mg/dL — ABNORMAL HIGH (ref 6–20)
CO2: 25 mmol/L (ref 22–32)
Calcium: 8 mg/dL — ABNORMAL LOW (ref 8.9–10.3)
Chloride: 112 mmol/L — ABNORMAL HIGH (ref 98–111)
Creatinine, Ser: 0.74 mg/dL (ref 0.61–1.24)
GFR, Estimated: >60 mL/min (ref >60)
Glucose, Bld: 140 mg/dL — ABNORMAL HIGH (ref 70–99)
Potassium: 3.8 mmol/L (ref 3.5–5.1)
Sodium: 144 mmol/L (ref 135–145)
Total Bilirubin: 0.6 mg/dL (ref 0.3–1.2)
Total Protein: 7 g/dL (ref 6.5–8.1)

## 2022-04-08 LAB — MAGNESIUM: Magnesium: 2.5 mg/dL — ABNORMAL HIGH (ref 1.7–2.4)

## 2022-04-08 NOTE — Progress Notes (Signed)
Progress Note   Patient: Jose Lamb [id number] DOB: July 26, 1960 DOA: 04/05/2022     2 DOS: the patient was seen and examined on 04/08/2022   Brief hospital course: As per H&P written by Dr.Zierle-Ghosh on 04/06/22 Jose Lamb is a 62 y.o. male with medical history significant of atrial fibrillation, essential hypertension, HIV, history of PE with last COVID infection, and more presents the ED with a chief complaint of cold.  Patient reports he is just been so cold.  He has had intermittent episodes of confusion during his stay in the ED, just had to be moved to different room so that he could be watched more closely.  During my exam he is very somnolent.  It is difficult to get him to answer questions.  He reports that he does have COVID.  He is not sure when his symptoms started.  He has a cough that is productive of yellow sputum.  He describes it as a "extreme" cough.  He reports he has felt feverish.  He denies chest pain.  Patient does not provide more history than that.   He was able to tell other hospital staff that he was so weak that his legs gave out and he had a fall.  Patient is overtly falling asleep during my exam and not answering all questions appropriately.   Patient does not smoke, does not drink.  He is vaccinated for COVID.  Patient is full code. Assessment and Plan: * Acute respiratory failure with hypoxia (HCC) - O2 sat down to 84% on room air at time of admission; currently no longer requiring oxygen supplementation. - COVID-positive and Chest x-ray showing basilar pneumonia - Procalcitonin elevated suggesting presence of bacterial infection as well.   - Continue Rocephin and Zithromax - So far blood cultures without any growth. - Continue supportive care and follow clinical response.  Sepsis due to pneumonia [facility]) - Patient met criteria for sepsis at time of admission with heart rate 110s-116, respiratory rate 24-41, with acute metabolic encephalopathy and  hypothermia - Blood cultures not demonstrating any growth at the moment. - Chest x-ray shows left basilar pneumonia which is confirmed with CT scan - COVID-positive as well - Continue treatment with current antibiotics, adequate hydration, steroids, remdesivir, bronchodilator, antitussive medications as needed antipyretics and Bair hugger. -Follow clinical response.  Acute metabolic encephalopathy - At time of presentation in the setting of acute pneumonia with mild hypoxia most likely -Mentation is now improving back to normal -Patient demonstrated to be oriented x 3 and following commands appropriately. -Continue to follow clinical response.  GERD (gastroesophageal reflux disease) - Continue Pepcid and Protonix -Lifestyle changes discussed with patient.  History of HIV -Currently not receiving any antiretroviral medication -Continue outpatient follow-up with infectious disease doctor -Per patient reports most recent CD4 above 600 count and viral load undetectable.  Diabetes mellitus type 2 in obese (HCC) - Continue long-acting and sliding scale insulin -The use of his steroids most likely contributing to elevated CBGs.  Follow trend and adjust treatment as needed.  Pneumonia due to COVID-19 virus -Continue treatment with steroids, remdesivir, antitussive medication and as needed bronchodilators. -Continue to maintain adequate hydration and follow clinical response. -Currently not requiring oxygen supplementation demonstrating good saturation on room air. -Overall feeling better.  Subjective:  Reporting short winded sensation is improving; no chest pain, no nausea, no vomiting.  Improved air movement bilaterally and demonstrating good saturation on room air.  Patient is afebrile.  Physical Exam: Vitals:   04/08/22 0400 04/08/22  0500 04/08/22 0600 04/08/22 0700  BP: 138/82 125/83 127/78 (!) 136/97  Pulse: 71 64 62 72  Resp: 19 17 (!) 0 15  Temp: (!) 97.3 F (36.3 C)      TempSrc: Oral     SpO2: 97% 94% 94% 94%  Weight:  106.7 kg    Height:       General exam: Alert, awake, oriented x 3; feeling much improved and no requiring oxygen supplementation.  Still slightly short winded with activity and experiencing mildly productive coughing spells.  Patient able to speak in full sentences. Respiratory system: No using accessory muscles; positive rhonchi bilaterally, no wheezing, good air movement. Cardiovascular system:RRR. No rubs or gallops; no JVD. Gastrointestinal system: Abdomen is obese, nondistended, soft and nontender. No organomegaly or masses felt. Normal bowel sounds heard. Central nervous system: Alert and oriented. No focal neurological deficits. Extremities: No cyanosis or clubbing. Skin: No petechiae. Psychiatry: Judgement and insight appear normal. Mood & affect appropriate.   Data Reviewed: MRSA PCR: Negative Comprehensive metabolic panel: Sodium 123456, potassium 3.8, chloride 112, bicarb 25, glucose 140, BUN 25, creatinine 0.74 and normal LFTs. CBC: WBC 11.3, hemoglobin 11.7 and platelet count 128 K. Magnesium: 2.5  Family Communication: Wife updated over the phone.  Disposition: Status is: Inpatient Remains inpatient appropriate because: Continue treatment for sepsis due to pneumonia.    Planned Discharge Destination: Home   Time spent: 50 minutes  Author: Barton Dubois, MD 04/08/2022 10:45 AM  For on call review [email or web address].

## 2022-04-09 DIAGNOSIS — U071 COVID-19: Secondary | ICD-10-CM | POA: Diagnosis not present

## 2022-04-09 DIAGNOSIS — E1169 Type 2 diabetes mellitus with other specified complication: Secondary | ICD-10-CM | POA: Diagnosis not present

## 2022-04-09 DIAGNOSIS — J9601 Acute respiratory failure with hypoxia: Secondary | ICD-10-CM | POA: Diagnosis not present

## 2022-04-09 DIAGNOSIS — K219 Gastro-esophageal reflux disease without esophagitis: Secondary | ICD-10-CM | POA: Diagnosis not present

## 2022-04-09 LAB — CBC WITH DIFFERENTIAL/PLATELET
Abs Immature Granulocytes: 0.04 10*3/uL (ref 0.00–0.07)
Basophils Absolute: 0 10*3/uL (ref 0.0–0.1)
Basophils Relative: 0 %
Eosinophils Absolute: 0 10*3/uL (ref 0.0–0.5)
Eosinophils Relative: 0 %
HCT: 37.3 % — ABNORMAL LOW (ref 39.0–52.0)
Hemoglobin: 11.7 g/dL — ABNORMAL LOW (ref 13.0–17.0)
Immature Granulocytes: 1 %
Lymphocytes Relative: 14 %
Lymphs Abs: 1 10*3/uL (ref 0.7–4.0)
MCH: 29 pg (ref 26.0–34.0)
MCHC: 31.4 g/dL (ref 30.0–36.0)
MCV: 92.3 fL (ref 80.0–100.0)
Monocytes Absolute: 0.1 10*3/uL (ref 0.1–1.0)
Monocytes Relative: 2 %
Neutro Abs: 6.1 10*3/uL (ref 1.7–7.7)
Neutrophils Relative %: 83 %
Platelets: 137 10*3/uL — ABNORMAL LOW (ref 150–400)
RBC: 4.04 MIL/uL — ABNORMAL LOW (ref 4.22–5.81)
RDW: 14.7 % (ref 11.5–15.5)
WBC: 7.3 10*3/uL (ref 4.0–10.5)
nRBC: 0 % (ref 0.0–0.2)

## 2022-04-09 LAB — COMPREHENSIVE METABOLIC PANEL
ALT: 19 U/L (ref 0–44)
AST: 22 U/L (ref 15–41)
Albumin: 2.8 g/dL — ABNORMAL LOW (ref 3.5–5.0)
Alkaline Phosphatase: 39 U/L (ref 38–126)
Anion gap: 5 (ref 5–15)
BUN: 28 mg/dL — ABNORMAL HIGH (ref 6–20)
CO2: 26 mmol/L (ref 22–32)
Calcium: 7.8 mg/dL — ABNORMAL LOW (ref 8.9–10.3)
Chloride: 113 mmol/L — ABNORMAL HIGH (ref 98–111)
Creatinine, Ser: 0.77 mg/dL (ref 0.61–1.24)
GFR, Estimated: >60 mL/min (ref >60)
Glucose, Bld: 140 mg/dL — ABNORMAL HIGH (ref 70–99)
Potassium: 3.6 mmol/L (ref 3.5–5.1)
Sodium: 144 mmol/L (ref 135–145)
Total Bilirubin: 0.5 mg/dL (ref 0.3–1.2)
Total Protein: 6.9 g/dL (ref 6.5–8.1)

## 2022-04-09 LAB — MAGNESIUM: Magnesium: 2.4 mg/dL (ref 1.7–2.4)

## 2022-04-09 LAB — GLUCOSE, CAPILLARY
Glucose-Capillary: 135 mg/dL — ABNORMAL HIGH (ref 70–99)
Glucose-Capillary: 146 mg/dL — ABNORMAL HIGH (ref 70–99)

## 2022-04-09 MED ORDER — ZINC SULFATE 220 (50 ZN) MG PO CAPS: 220.0000 mg | ORAL_CAPSULE | Freq: Every day | ORAL | 1 refills | Status: DC

## 2022-04-09 MED ORDER — DOXYCYCLINE HYCLATE 100 MG PO TABS: 100.0000 mg | ORAL_TABLET | Freq: Two times a day (BID) | ORAL | 0 refills | Status: AC

## 2022-04-09 MED ORDER — CEFDINIR 300 MG PO CAPS: 300.0000 mg | ORAL_CAPSULE | Freq: Two times a day (BID) | ORAL | 0 refills | Status: AC

## 2022-04-09 MED ORDER — PREDNISONE 20 MG PO TABS: ORAL_TABLET | ORAL | 0 refills | Status: DC

## 2022-04-09 MED ORDER — GUAIFENESIN-DM 100-10 MG/5ML PO SYRP: 10.0000 mL | ORAL_SOLUTION | ORAL | 0 refills | Status: DC | PRN

## 2022-04-09 MED ORDER — ASCORBIC ACID 500 MG PO TABS: 500.0000 mg | ORAL_TABLET | Freq: Every day | ORAL | 1 refills | Status: DC

## 2022-04-09 NOTE — Plan of Care (Signed)
  Problem: Education: Goal: Ability to describe self-care measures that may prevent or decrease complications (Diabetes Survival Skills Education) will improve 04/09/2022 1026 by Santa Lighter, RN Outcome: Adequate for Discharge 04/09/2022 1025 by Santa Lighter, RN Outcome: Progressing Goal: Individualized Educational Video(s) Outcome: Adequate for Discharge   Problem: Coping: Goal: Ability to adjust to condition or change in health will improve Outcome: Adequate for Discharge   Problem: Fluid Volume: Goal: Ability to maintain a balanced intake and output will improve Outcome: Adequate for Discharge   Problem: Health Behavior/Discharge Planning: Goal: Ability to identify and utilize available resources and services will improve Outcome: Adequate for Discharge Goal: Ability to manage health-related needs will improve Outcome: Adequate for Discharge   Problem: Metabolic: Goal: Ability to maintain appropriate glucose levels will improve Outcome: Adequate for Discharge   Problem: Nutritional: Goal: Maintenance of adequate nutrition will improve Outcome: Adequate for Discharge Goal: Progress toward achieving an optimal weight will improve Outcome: Adequate for Discharge   Problem: Skin Integrity: Goal: Risk for impaired skin integrity will decrease Outcome: Adequate for Discharge   Problem: Tissue Perfusion: Goal: Adequacy of tissue perfusion will improve Outcome: Adequate for Discharge   Problem: Education: Goal: Knowledge of risk factors and measures for prevention of condition will improve Outcome: Adequate for Discharge   Problem: Coping: Goal: Psychosocial and spiritual needs will be supported Outcome: Adequate for Discharge   Problem: Respiratory: Goal: Will maintain a patent airway Outcome: Adequate for Discharge Goal: Complications related to the disease process, condition or treatment will be avoided or minimized Outcome: Adequate for Discharge   Problem:  Education: Goal: Knowledge of General Education information will improve Description: Including pain rating scale, medication(s)/side effects and non-pharmacologic comfort measures 04/09/2022 1026 by Santa Lighter, RN Outcome: Adequate for Discharge 04/09/2022 1025 by Santa Lighter, RN Outcome: Progressing   Problem: Health Behavior/Discharge Planning: Goal: Ability to manage health-related needs will improve Outcome: Adequate for Discharge   Problem: Clinical Measurements: Goal: Ability to maintain clinical measurements within normal limits will improve Outcome: Adequate for Discharge Goal: Will remain free from infection Outcome: Adequate for Discharge Goal: Diagnostic test results will improve Outcome: Adequate for Discharge Goal: Respiratory complications will improve Outcome: Adequate for Discharge Goal: Cardiovascular complication will be avoided Outcome: Adequate for Discharge   Problem: Activity: Goal: Risk for activity intolerance will decrease Outcome: Adequate for Discharge   Problem: Nutrition: Goal: Adequate nutrition will be maintained Outcome: Adequate for Discharge   Problem: Coping: Goal: Level of anxiety will decrease Outcome: Adequate for Discharge   Problem: Elimination: Goal: Will not experience complications related to bowel motility Outcome: Adequate for Discharge Goal: Will not experience complications related to urinary retention Outcome: Adequate for Discharge   Problem: Pain Managment: Goal: General experience of comfort will improve Outcome: Adequate for Discharge   Problem: Safety: Goal: Ability to remain free from injury will improve Outcome: Adequate for Discharge   Problem: Skin Integrity: Goal: Risk for impaired skin integrity will decrease Outcome: Adequate for Discharge

## 2022-04-09 NOTE — Progress Notes (Signed)
Patient slept for a short time during the night. No complaints of pain or discomfort. Patient ambulatory to the restroom independently. Plan of care ongoing.

## 2022-04-09 NOTE — Plan of Care (Signed)
  Problem: Education: Goal: Ability to describe self-care measures that may prevent or decrease complications (Diabetes Survival Skills Education) will improve Outcome: Progressing   Problem: Education: Goal: Knowledge of General Education information will improve Description: Including pain rating scale, medication(s)/side effects and non-pharmacologic comfort measures Outcome: Progressing   

## 2022-04-09 NOTE — Progress Notes (Signed)
Removed IV-CDI. Reviewed d/c paperwork with patient and answered questions. Wheeled stable patient and belongings to main entrance where he was picked up by his wife.

## 2022-04-09 NOTE — Discharge Summary (Signed)
Physician Discharge Summary   Patient: Jose Lamb MRN: [id number] DOB: 31-Dec-1960  Admit date:     04/05/2022  Discharge date: 04/09/22  Discharge Physician: Barton Dubois   PCP: [name], [name]   Recommendations at discharge:  Repeat basic metabolic panel to follow electrolytes and renal function Repeat chest x-ray in 6-8 weeks to assure complete resolution of infiltrates Continue close monitoring of patient's CBGs with further decision regarding hypoglycemic regimen initiation is required.  Discharge Diagnoses: Principal Problem:   Acute respiratory failure with hypoxia (HCC) Active Problems:   Pneumonia due to COVID-19 virus   Diabetes mellitus type 2 in obese (HCC)   GERD (gastroesophageal reflux disease)   Acute metabolic encephalopathy   Sepsis due to pneumonia [facility])  Hospital Course: As per H&P written by Dr.Zierle-Ghosh on 04/06/22 Jose Lamb is a 62 y.o. male with medical history significant of atrial fibrillation, essential hypertension, HIV, history of PE with last COVID infection, and more presents the ED with a chief complaint of cold.  Patient reports he is just been so cold.  He has had intermittent episodes of confusion during his stay in the ED, just had to be moved to different room so that he could be watched more closely.  During my exam he is very somnolent.  It is difficult to get him to answer questions.  He reports that he does have COVID.  He is not sure when his symptoms started.  He has a cough that is productive of yellow sputum.  He describes it as a "extreme" cough.  He reports he has felt feverish.  He denies chest pain.  Patient does not provide more history than that.   He was able to tell other hospital staff that he was so weak that his legs gave out and he had a fall.  Patient is overtly falling asleep during my exam and not answering all questions appropriately.   Patient does not smoke, does not drink.  He is vaccinated for COVID.   Patient is full code.  Assessment and Plan: * Acute respiratory failure with hypoxia (HCC) - O2 sat down to 84% on room air at time of admission; currently no longer requiring oxygen supplementation. - COVID-positive and Chest x-ray showing basilar pneumonia - Procalcitonin elevated suggesting presence of bacterial infection as well.   - Excellent response to the use of remdesivir, steroids and IV antibiotics -At discharge not requiring oxygen supplementation, demonstrating significant improvement in symptoms and speaking in full sentences. - So far blood cultures without any growth. - Patient discharged on oral cefdinir and doxycycline to complete antibiotic therapy. -Approximate and adequate hydration and to follow-up with PCP in 10 days.  Sepsis due to pneumonia [facility]) - Patient met criteria for sepsis at time of admission with heart rate 110s-116, respiratory rate 24-41, with acute metabolic encephalopathy and hypothermia - Blood cultures not demonstrating any growth at the moment. - Chest x-ray shows left basilar pneumonia which is confirmed with CT scan - COVID-positive as well - Continue treatment for COVID and pneumonia as already specified. -Sepsis features resolved at time of discharge. -Patient advised to maintain adequate hydration.  Acute metabolic encephalopathy - At time of presentation in the setting of acute pneumonia with mild hypoxia most likely. -Mentation is now improving back to normal -Patient demonstrated to be oriented x 3 and following commands appropriately. -Continue to follow clinical response.  GERD (gastroesophageal reflux disease) - Continue Pepcid and Protonix  Diabetes mellitus type 2 in obese ([place]) -  Resolved modified carbohydrate diet -Patient advised to continue weight loss management -Outpatient follow-up with PCP to further assess CBG fluctuation and determine the need to initiate hypoglycemic regimen.  Pneumonia due to COVID-19  virus -Continue treatment with steroids tapering, antitussive medication and as needed bronchodilators. -Patient completed the use of remdesivir while hospitalized with excellent response. -Instructed to maintain adequate hydration and to continue following the 3 tW's. -Currently not requiring oxygen supplementation demonstrating good saturation on room air.  History of HIV -Not receiving any antiretroviral medication. -Continue patient follow-up with infectious disease doctor.  Class I obesity -Low-calorie diet, portion control and increase physical activity discussed with patient. -Body mass index is 32.81 kg/m.   Consultants: None Procedures performed: See below for x-ray reports. Disposition: Home Diet recommendation: Low calorie and modified carbohydrate diet.  DISCHARGE MEDICATION: Allergies as of 04/09/2022       Reactions   Asa [aspirin] Other (See Comments)   Nose bleeds        Medication List     STOP taking these medications    benzonatate 200 MG capsule Commonly known as: TESSALON   Xarelto 20 MG Tabs tablet Generic drug: rivaroxaban       TAKE these medications    albuterol 108 (90 Base) MCG/ACT inhaler Commonly known as: VENTOLIN HFA 1 puff every 4 (four) hours as needed for wheezing or shortness of breath.   albuterol (2.5 MG/3ML) 0.083% nebulizer solution Commonly known as: PROVENTIL Take 2.5 mg by nebulization every 4 (four) hours.   ascorbic acid 500 MG tablet Commonly known as: VITAMIN C Take 1 tablet (500 mg total) by mouth daily. Start taking on: April 10, 2022   cefdinir 300 MG capsule Commonly known as: OMNICEF Take 1 capsule (300 mg total) by mouth 2 (two) times daily for 5 days.   doxycycline 100 MG tablet Commonly known as: VIBRA-TABS Take 1 tablet (100 mg total) by mouth 2 (two) times daily for 5 days.   famotidine 20 MG tablet Commonly known as: Pepcid One after supper   furosemide 40 MG tablet Commonly known as:  LASIX Take 40 mg by mouth 2 (two) times daily as needed for fluid.   guaiFENesin-dextromethorphan 100-10 MG/5ML syrup Commonly known as: ROBITUSSIN DM Take 10 mLs by mouth every 4 (four) hours as needed for cough.   pantoprazole 40 MG tablet Commonly known as: Protonix Take 1 tablet (40 mg total) by mouth daily. Take 30-60 min before first meal of the day   Potassium Chloride ER 20 MEQ Tbcr Take 1 tablet by mouth daily at 6 (six) AM.   predniSONE 20 MG tablet Commonly known as: DELTASONE Take 3 tablets by mouth x 1 day; then 2 tablet by mouth x 2 days; then 1 tablet by mouth daily x 2 days; then half tablet by mouth daily x 3 days and stop prednisone. Start taking on: April 10, 2022   zinc sulfate 220 (50 Zn) MG capsule Take 1 capsule (220 mg total) by mouth daily. Start taking on: April 10, 2022        Discharge Exam: Danley Danker Weights   04/06/22 1258 04/07/22 0600 04/08/22 0500  Weight: 104.5 kg 106.1 kg 106.7 kg   General exam: Alert, awake, oriented x 3; febrile, no chest pain, no nausea, no vomiting.  Speaking in full sentences and demonstrating good saturation on room air.  Feeling ready to go home. Respiratory system: Improved air movement bilaterally; no using accessory muscle.  Positive scattered rhonchi; no wheezing. Cardiovascular system:RRR.  No rubs or gallops. Gastrointestinal system: Abdomen is obese, nondistended, soft and nontender. No organomegaly or masses felt. Normal bowel sounds heard. Central nervous system: Alert and oriented. No focal neurological deficits. Extremities: No sign gnosis or clubbing. Skin: No petechiae. Psychiatry: Judgement and insight appear normal. Mood & affect appropriate.    Condition at discharge: Stable and improved.  The results of significant diagnostics from this hospitalization (including imaging, microbiology, ancillary and laboratory) are listed below for reference.   Imaging Studies: CT Angio Chest PE W/Cm &/Or Wo  Cm  Result Date: 04/06/2022 CLINICAL DATA:  Pulmonary embolism (PE) suspected, high prob. SOB, cough, low grade fever, prior history of PE. EXAM: CT ANGIOGRAPHY CHEST WITH CONTRAST TECHNIQUE: Multidetector CT imaging of the chest was performed using the standard protocol during bolus administration of intravenous contrast. Multiplanar CT image reconstructions and MIPs were obtained to evaluate the vascular anatomy. RADIATION DOSE REDUCTION: This exam was performed according to the departmental dose-optimization program which includes automated exposure control, adjustment of the mA and/or kV according to patient size and/or use of iterative reconstruction technique. CONTRAST:  167m OMNIPAQUE IOHEXOL 350 MG/ML SOLN COMPARISON:  09/16/2019 FINDINGS: Cardiovascular: There is adequate opacification of the pulmonary arterial tree. No intraluminal filling defect identified through the segmental level to suggest acute pulmonary embolism. The central pulmonary arteries are enlarged in keeping with changes of pulmonary arterial hypertension. Mild global cardiomegaly. Mild coronary artery calcification. No pericardial effusion. The thoracic aorta is unremarkable. Mediastinum/Nodes: Visualized thyroid is unremarkable. No pathologic thoracic adenopathy. Esophagus is unremarkable. Lungs/Pleura: There is bronchial wall thickening present, best appreciated within the lower lobes bilaterally in keeping with airway inflammation. There is superimposed airway impaction and peripheral consolidation within the left lower lobe in keeping with changes of acute lobar pneumonia or aspiration. Mild right basilar atelectasis or scarring present. No pneumothorax or pleural effusion. No central obstructing lesion. Upper Abdomen: No acute abnormality. Musculoskeletal: No chest wall abnormality. No acute or significant osseous findings. Review of the MIP images confirms the above findings. IMPRESSION: 1. No pulmonary embolism. 2. Morphologic  changes in keeping with pulmonary arterial hypertension. Mild global cardiomegaly. Mild coronary artery calcification. 3. Bronchial wall thickening in keeping with airway inflammation. Superimposed airway impaction and peripheral consolidation within the left lower lobe in keeping with changes of acute lobar pneumonia or aspiration. Electronically Signed   By: AFidela SalisburyM.D.   On: 04/06/2022 00:41   DG Chest Port 1 View  Result Date: 04/05/2022 CLINICAL DATA:  Fever, weakness EXAM: PORTABLE CHEST 1 VIEW COMPARISON:  12/14/2021 FINDINGS: Stable borderline cardiomegaly. Pulmonary vascular congestion. Left basilar atelectasis or infiltrates. Small left pleural effusion. No pneumothorax. No displaced rib fractures. IMPRESSION: Left basilar atelectasis or pneumonia.  Small left pleural effusion. Borderline cardiomegaly and pulmonary vascular congestion. Electronically Signed   By: TPlacido SouM.D.   On: 04/05/2022 23:28    Microbiology: Results for orders placed or performed during the hospital encounter of 04/05/22  Resp panel by RT-PCR (RSV, Flu A&B, Covid) Anterior Nasal Swab     Status: Abnormal   Collection Time: 04/05/22 10:45 PM   Specimen: Anterior Nasal Swab  Result Value Ref Range Status   SARS Coronavirus 2 by RT PCR POSITIVE (A) NEGATIVE Final    Comment: (NOTE) SARS-CoV-2 target nucleic acids are DETECTED.  The SARS-CoV-2 RNA is generally detectable in upper respiratory specimens during the acute phase of infection. Positive results are indicative of the presence of the identified virus, but do not rule out bacterial  infection or co-infection with other pathogens not detected by the test. Clinical correlation with patient history and other diagnostic information is necessary to determine patient infection status. The expected result is Negative.  Fact Sheet for Patients: EntrepreneurPulse.com.au  Fact Sheet for Healthcare  Providers: IncredibleEmployment.be  This test is not yet approved or cleared by the [place] FDA and  has been authorized for detection and/or diagnosis of SARS-CoV-2 by FDA under an Emergency Use Authorization (EUA).  This EUA will remain in effect (meaning this test can be used) for the duration of  the COVID-19 declaration under Section 564(b)(1) of the A ct, 21 U.S.C. section 360bbb-3(b)(1), unless the authorization is terminated or revoked sooner.     Influenza A by PCR NEGATIVE NEGATIVE Final   Influenza B by PCR NEGATIVE NEGATIVE Final    Comment: (NOTE) The Xpert Xpress SARS-CoV-2/FLU/RSV plus assay is intended as an aid in the diagnosis of influenza from Nasopharyngeal swab specimens and should not be used as a sole basis for treatment. Nasal washings and aspirates are unacceptable for Xpert Xpress SARS-CoV-2/FLU/RSV testing.  Fact Sheet for Patients: EntrepreneurPulse.com.au  Fact Sheet for Healthcare Providers: IncredibleEmployment.be  This test is not yet approved or cleared by the [place] FDA and has been authorized for detection and/or diagnosis of SARS-CoV-2 by FDA under an Emergency Use Authorization (EUA). This EUA will remain in effect (meaning this test can be used) for the duration of the COVID-19 declaration under Section 564(b)(1) of the Act, 21 U.S.C. section 360bbb-3(b)(1), unless the authorization is terminated or revoked.     Resp Syncytial Virus by PCR NEGATIVE NEGATIVE Final    Comment: (NOTE) Fact Sheet for Patients: EntrepreneurPulse.com.au  Fact Sheet for Healthcare Providers: IncredibleEmployment.be  This test is not yet approved or cleared by the [place] FDA and has been authorized for detection and/or diagnosis of SARS-CoV-2 by FDA under an Emergency Use Authorization (EUA). This EUA will remain in effect (meaning this test can be  used) for the duration of the COVID-19 declaration under Section 564(b)(1) of the Act, 21 U.S.C. section 360bbb-3(b)(1), unless the authorization is terminated or revoked.  Performed at [facility], [address] [place], [place] [address]   Blood Culture (routine x 2)     Status: None (Preliminary result)   Collection Time: 04/05/22 11:02 PM   Specimen: BLOOD  Result Value Ref Range Status   Specimen Description BLOOD BLOOD RIGHT ARM  Final   Special Requests   Final    BOTTLES DRAWN AEROBIC AND ANAEROBIC Blood Culture results may not be optimal due to an excessive volume of blood received in culture bottles   Culture   Final    NO GROWTH 4 DAYS Performed at [facility], [address] [place], [place] [address]    Report Status PENDING  Incomplete  Blood Culture (routine x 2)     Status: None (Preliminary result)   Collection Time: 04/05/22 11:21 PM   Specimen: BLOOD  Result Value Ref Range Status   Specimen Description BLOOD BLOOD LEFT HAND  Final   Special Requests   Final    BOTTLES DRAWN AEROBIC AND ANAEROBIC Blood Culture results may not be optimal due to an excessive volume of blood received in culture bottles   Culture   Final    NO GROWTH 4 DAYS Performed at [facility], [address] [place], [place] [address]    Report Status PENDING  Incomplete  MRSA Next Gen by PCR, Nasal  Status: None   Collection Time: 04/07/22  3:15 AM   Specimen: Nasal Mucosa; Nasal Swab  Result Value Ref Range Status   MRSA by PCR Next Gen NOT DETECTED NOT DETECTED Final    Comment: (NOTE) The GeneXpert MRSA Assay (FDA approved for NASAL specimens only), is one component of a comprehensive MRSA colonization surveillance program. It is not intended to diagnose MRSA infection nor to guide or monitor treatment for MRSA infections. Test performance is not FDA approved in patients less than 85 years old. Performed at [facility], [address], [place], [place] [address]      Labs: CBC: Recent Labs  Lab 04/05/22 2302 04/07/22 0430 04/08/22 0357 04/09/22 0448  WBC 6.7 9.8 9.3 7.3  NEUTROABS 5.3 8.3* 7.9* 6.1  HGB 11.7* 11.7* 11.7* 11.7*  HCT 37.5* 37.2* 36.8* 37.3*  MCV 91.7 91.4 91.5 92.3  PLT 128* 117* 128* 0000000*   Basic Metabolic Panel: Recent Labs  Lab 04/05/22 2302 04/07/22 0430 04/08/22 0357 04/09/22 0448  NA 140 142 144 144  K 3.5 3.6 3.8 3.6  CL 104 110 112* 113*  CO2 '25 25 25 26  '$ GLUCOSE 133* 154* 140* 140*  BUN 15 23* 25* 28*  CREATININE 1.05 0.74 0.74 0.77  CALCIUM 8.9 8.3* 8.0* 7.8*  MG  --  2.4 2.5* 2.4   Liver Function Tests: Recent Labs  Lab 04/05/22 2302 04/07/22 0430 04/08/22 0357 04/09/22 0448  AST '24 24 19 22  '$ ALT '17 18 17 19  '$ ALKPHOS 57 44 40 39  BILITOT 0.8 0.5 0.6 0.5  PROT 8.5* 7.5 7.0 6.9  ALBUMIN 3.7 3.1* 2.9* 2.8*   CBG: Recent Labs  Lab 04/08/22 1116 04/08/22 1618 04/08/22 2053 04/09/22 0731 04/09/22 1136  GLUCAP 167* 148* 173* 135* 146*    Discharge time spent: greater than 30 minutes.  Signed: Barton Dubois, MD [facility] 04/09/2022

## 2022-04-10 ENCOUNTER — Other Ambulatory Visit (HOSPITAL_COMMUNITY)
Admission: RE | Admit: 2022-04-10 | Discharge: 2022-04-10 | Disposition: A | Discharge status: Home / Self Care | Payer: Managed Care, Other (non HMO) | Source: Ambulatory Visit | Attending: Family Medicine | Admitting: Family Medicine

## 2022-04-10 DIAGNOSIS — Z0001 Encounter for general adult medical examination with abnormal findings: Secondary | ICD-10-CM | POA: Insufficient documentation

## 2022-04-10 DIAGNOSIS — D509 Iron deficiency anemia, unspecified: Secondary | ICD-10-CM | POA: Diagnosis not present

## 2022-04-10 DIAGNOSIS — E1165 Type 2 diabetes mellitus with hyperglycemia: Secondary | ICD-10-CM | POA: Insufficient documentation

## 2022-04-10 LAB — COMPREHENSIVE METABOLIC PANEL
ALT: 58 U/L — ABNORMAL HIGH (ref 0–44)
AST: 41 U/L (ref 15–41)
Albumin: 2.9 g/dL — ABNORMAL LOW (ref 3.5–5.0)
Alkaline Phosphatase: 42 U/L (ref 38–126)
Anion gap: 6 (ref 5–15)
BUN: 21 mg/dL — ABNORMAL HIGH (ref 6–20)
CO2: 27 mmol/L (ref 22–32)
Calcium: 7.6 mg/dL — ABNORMAL LOW (ref 8.9–10.3)
Chloride: 107 mmol/L (ref 98–111)
Creatinine, Ser: 0.87 mg/dL (ref 0.61–1.24)
GFR, Estimated: >60 mL/min (ref >60)
Glucose, Bld: 114 mg/dL — ABNORMAL HIGH (ref 70–99)
Potassium: 3 mmol/L — ABNORMAL LOW (ref 3.5–5.1)
Sodium: 140 mmol/L (ref 135–145)
Total Bilirubin: 0.5 mg/dL (ref 0.3–1.2)
Total Protein: 6.6 g/dL (ref 6.5–8.1)

## 2022-04-10 LAB — LIPID PANEL
Cholesterol: 197 mg/dL (ref 0–200)
HDL: 39 mg/dL — ABNORMAL LOW (ref >40)
LDL Cholesterol: 126 mg/dL — ABNORMAL HIGH (ref 0–99)
Total CHOL/HDL Ratio: 5.1 RATIO
Triglycerides: 158 mg/dL — ABNORMAL HIGH (ref <150)
VLDL: 32 mg/dL (ref 0–40)

## 2022-04-10 LAB — IRON AND TIBC
Iron: 118 ug/dL (ref 45–182)
Saturation Ratios: 55 % — ABNORMAL HIGH (ref 17.9–39.5)
TIBC: 213 ug/dL — ABNORMAL LOW (ref 250–450)
UIBC: 95 ug/dL

## 2022-04-10 LAB — CULTURE, BLOOD (ROUTINE X 2)
Culture: NO GROWTH
Culture: NO GROWTH

## 2022-04-10 LAB — CBC WITH DIFFERENTIAL/PLATELET
Abs Immature Granulocytes: 0.1 10*3/uL — ABNORMAL HIGH (ref 0.00–0.07)
Basophils Absolute: 0 10*3/uL (ref 0.0–0.1)
Basophils Relative: 0 %
Eosinophils Absolute: 0 10*3/uL (ref 0.0–0.5)
Eosinophils Relative: 0 %
HCT: 37.4 % — ABNORMAL LOW (ref 39.0–52.0)
Hemoglobin: 12 g/dL — ABNORMAL LOW (ref 13.0–17.0)
Immature Granulocytes: 2 %
Lymphocytes Relative: 22 %
Lymphs Abs: 1.4 10*3/uL (ref 0.7–4.0)
MCH: 29.3 pg (ref 26.0–34.0)
MCHC: 32.1 g/dL (ref 30.0–36.0)
MCV: 91.4 fL (ref 80.0–100.0)
Monocytes Absolute: 0.8 10*3/uL (ref 0.1–1.0)
Monocytes Relative: 12 %
Neutro Abs: 4.2 10*3/uL (ref 1.7–7.7)
Neutrophils Relative %: 64 %
Platelets: 147 10*3/uL — ABNORMAL LOW (ref 150–400)
RBC: 4.09 MIL/uL — ABNORMAL LOW (ref 4.22–5.81)
RDW: 14.5 % (ref 11.5–15.5)
WBC: 6.6 10*3/uL (ref 4.0–10.5)
nRBC: 0.3 % — ABNORMAL HIGH (ref 0.0–0.2)

## 2022-04-10 LAB — RETICULOCYTES
Immature Retic Fract: 22.3 % — ABNORMAL HIGH (ref 2.3–15.9)
RBC.: 4.11 MIL/uL — ABNORMAL LOW (ref 4.22–5.81)
Retic Count, Absolute: 37.8 10*3/uL (ref 19.0–186.0)
Retic Ct Pct: 0.9 % (ref 0.4–3.1)

## 2022-04-10 LAB — VITAMIN B12: Vitamin B-12: 261 pg/mL (ref 180–914)

## 2022-04-10 LAB — PSA: Prostatic Specific Antigen: 2.24 ng/mL (ref 0.00–4.00)

## 2022-04-10 LAB — FOLATE: Folate: 8.2 ng/mL (ref >5.9)

## 2022-04-10 LAB — FERRITIN: Ferritin: 449 ng/mL — ABNORMAL HIGH (ref 24–336)

## 2022-04-11 LAB — HEMOGLOBIN A1C
Hgb A1c MFr Bld: 6.6 % — ABNORMAL HIGH (ref 4.8–5.6)
Mean Plasma Glucose: 143 mg/dL

## 2022-06-08 ENCOUNTER — Other Ambulatory Visit (HOSPITAL_COMMUNITY): Payer: Self-pay | Admitting: Family Medicine

## 2022-06-08 ENCOUNTER — Other Ambulatory Visit (HOSPITAL_COMMUNITY)
Admission: RE | Admit: 2022-06-08 | Discharge: 2022-06-08 | Disposition: A | Discharge status: Home / Self Care | Payer: BC Managed Care – PPO | Source: Ambulatory Visit | Attending: Family Medicine | Admitting: Family Medicine

## 2022-06-08 DIAGNOSIS — R159 Full incontinence of feces: Secondary | ICD-10-CM | POA: Diagnosis not present

## 2022-06-08 DIAGNOSIS — R29898 Other symptoms and signs involving the musculoskeletal system: Secondary | ICD-10-CM | POA: Insufficient documentation

## 2022-06-08 DIAGNOSIS — R2689 Other abnormalities of gait and mobility: Secondary | ICD-10-CM | POA: Diagnosis not present

## 2022-06-08 DIAGNOSIS — E1165 Type 2 diabetes mellitus with hyperglycemia: Secondary | ICD-10-CM | POA: Diagnosis not present

## 2022-06-08 DIAGNOSIS — M2550 Pain in unspecified joint: Secondary | ICD-10-CM | POA: Diagnosis not present

## 2022-06-08 DIAGNOSIS — E6609 Other obesity due to excess calories: Secondary | ICD-10-CM | POA: Diagnosis not present

## 2022-06-08 DIAGNOSIS — R600 Localized edema: Secondary | ICD-10-CM | POA: Diagnosis not present

## 2022-06-08 DIAGNOSIS — Z21 Asymptomatic human immunodeficiency virus [HIV] infection status: Secondary | ICD-10-CM | POA: Diagnosis not present

## 2022-06-08 DIAGNOSIS — G4710 Hypersomnia, unspecified: Secondary | ICD-10-CM | POA: Diagnosis not present

## 2022-06-08 DIAGNOSIS — D509 Iron deficiency anemia, unspecified: Secondary | ICD-10-CM | POA: Diagnosis not present

## 2022-06-08 DIAGNOSIS — Z6833 Body mass index (BMI) 33.0-33.9, adult: Secondary | ICD-10-CM | POA: Diagnosis not present

## 2022-06-08 DIAGNOSIS — E876 Hypokalemia: Secondary | ICD-10-CM | POA: Diagnosis not present

## 2022-06-08 LAB — CBC WITH DIFFERENTIAL/PLATELET
Abs Immature Granulocytes: 0.01 10*3/uL (ref 0.00–0.07)
Basophils Absolute: 0 10*3/uL (ref 0.0–0.1)
Basophils Relative: 0 %
Eosinophils Absolute: 0.2 10*3/uL (ref 0.0–0.5)
Eosinophils Relative: 8 %
HCT: 36.3 % — ABNORMAL LOW (ref 39.0–52.0)
Hemoglobin: 11.5 g/dL — ABNORMAL LOW (ref 13.0–17.0)
Immature Granulocytes: 0 %
Lymphocytes Relative: 45 %
Lymphs Abs: 1.3 10*3/uL (ref 0.7–4.0)
MCH: 29.3 pg (ref 26.0–34.0)
MCHC: 31.7 g/dL (ref 30.0–36.0)
MCV: 92.4 fL (ref 80.0–100.0)
Monocytes Absolute: 0.3 10*3/uL (ref 0.1–1.0)
Monocytes Relative: 9 %
Neutro Abs: 1.1 10*3/uL — ABNORMAL LOW (ref 1.7–7.7)
Neutrophils Relative %: 38 %
Platelets: 107 10*3/uL — ABNORMAL LOW (ref 150–400)
RBC: 3.93 MIL/uL — ABNORMAL LOW (ref 4.22–5.81)
RDW: 14.6 % (ref 11.5–15.5)
WBC: 3 10*3/uL — ABNORMAL LOW (ref 4.0–10.5)
nRBC: 0 % (ref 0.0–0.2)

## 2022-06-08 LAB — COMPREHENSIVE METABOLIC PANEL
ALT: 22 U/L (ref 0–44)
AST: 26 U/L (ref 15–41)
Albumin: 3.7 g/dL (ref 3.5–5.0)
Alkaline Phosphatase: 66 U/L (ref 38–126)
Anion gap: 9 (ref 5–15)
BUN: 9 mg/dL (ref 6–20)
CO2: 24 mmol/L (ref 22–32)
Calcium: 8.6 mg/dL — ABNORMAL LOW (ref 8.9–10.3)
Chloride: 106 mmol/L (ref 98–111)
Creatinine, Ser: 0.79 mg/dL (ref 0.61–1.24)
GFR, Estimated: >60 mL/min (ref >60)
Glucose, Bld: 114 mg/dL — ABNORMAL HIGH (ref 70–99)
Potassium: 3.2 mmol/L — ABNORMAL LOW (ref 3.5–5.1)
Sodium: 139 mmol/L (ref 135–145)
Total Bilirubin: 0.7 mg/dL (ref 0.3–1.2)
Total Protein: 7.3 g/dL (ref 6.5–8.1)

## 2022-06-08 LAB — SEDIMENTATION RATE: Sed Rate: 34 mm/hr — ABNORMAL HIGH (ref 0–16)

## 2022-06-10 LAB — RHEUMATOID FACTOR: Rheumatoid fact SerPl-aCnc: <10 IU/mL (ref <14.0)

## 2022-06-10 LAB — ANTINUCLEAR ANTIBODIES, IFA: ANA Ab, IFA: NEGATIVE

## 2022-06-14 ENCOUNTER — Other Ambulatory Visit (HOSPITAL_COMMUNITY)
Admission: RE | Admit: 2022-06-14 | Discharge: 2022-06-14 | Disposition: A | Discharge status: Home / Self Care | Payer: BC Managed Care – PPO | Source: Ambulatory Visit | Attending: Family Medicine | Admitting: Family Medicine

## 2022-06-14 DIAGNOSIS — R159 Full incontinence of feces: Secondary | ICD-10-CM | POA: Insufficient documentation

## 2022-06-14 DIAGNOSIS — R29898 Other symptoms and signs involving the musculoskeletal system: Secondary | ICD-10-CM | POA: Insufficient documentation

## 2022-06-14 DIAGNOSIS — R2689 Other abnormalities of gait and mobility: Secondary | ICD-10-CM | POA: Insufficient documentation

## 2022-06-14 LAB — COMPREHENSIVE METABOLIC PANEL
ALT: 21 U/L (ref 0–44)
AST: 23 U/L (ref 15–41)
Albumin: 3.8 g/dL (ref 3.5–5.0)
Alkaline Phosphatase: 70 U/L (ref 38–126)
Anion gap: 7 (ref 5–15)
BUN: 13 mg/dL (ref 6–20)
CO2: 23 mmol/L (ref 22–32)
Calcium: 8.7 mg/dL — ABNORMAL LOW (ref 8.9–10.3)
Chloride: 109 mmol/L (ref 98–111)
Creatinine, Ser: 0.68 mg/dL (ref 0.61–1.24)
GFR, Estimated: >60 mL/min (ref >60)
Glucose, Bld: 121 mg/dL — ABNORMAL HIGH (ref 70–99)
Potassium: 3.3 mmol/L — ABNORMAL LOW (ref 3.5–5.1)
Sodium: 139 mmol/L (ref 135–145)
Total Bilirubin: 0.5 mg/dL (ref 0.3–1.2)
Total Protein: 7.8 g/dL (ref 6.5–8.1)

## 2022-06-14 LAB — CBC WITH DIFFERENTIAL/PLATELET
Abs Immature Granulocytes: 0.01 10*3/uL (ref 0.00–0.07)
Basophils Absolute: 0 10*3/uL (ref 0.0–0.1)
Basophils Relative: 1 %
Eosinophils Absolute: 0.2 10*3/uL (ref 0.0–0.5)
Eosinophils Relative: 5 %
HCT: 35 % — ABNORMAL LOW (ref 39.0–52.0)
Hemoglobin: 11.4 g/dL — ABNORMAL LOW (ref 13.0–17.0)
Immature Granulocytes: 0 %
Lymphocytes Relative: 29 %
Lymphs Abs: 1 10*3/uL (ref 0.7–4.0)
MCH: 29.5 pg (ref 26.0–34.0)
MCHC: 32.6 g/dL (ref 30.0–36.0)
MCV: 90.4 fL (ref 80.0–100.0)
Monocytes Absolute: 0.3 10*3/uL (ref 0.1–1.0)
Monocytes Relative: 9 %
Neutro Abs: 1.8 10*3/uL (ref 1.7–7.7)
Neutrophils Relative %: 56 %
Platelets: 118 10*3/uL — ABNORMAL LOW (ref 150–400)
RBC: 3.87 MIL/uL — ABNORMAL LOW (ref 4.22–5.81)
RDW: 14.4 % (ref 11.5–15.5)
WBC: 3.3 10*3/uL — ABNORMAL LOW (ref 4.0–10.5)
nRBC: 0 % (ref 0.0–0.2)

## 2022-06-14 LAB — SEDIMENTATION RATE: Sed Rate: 47 mm/hr — ABNORMAL HIGH (ref 0–16)

## 2022-06-15 LAB — MISC LABCORP TEST (SEND OUT): Labcorp test code: 505271

## 2022-06-16 ENCOUNTER — Ambulatory Visit (HOSPITAL_COMMUNITY)
Admission: RE | Admit: 2022-06-16 | Discharge: 2022-06-16 | Disposition: A | Discharge status: Home / Self Care | Payer: BC Managed Care – PPO | Source: Ambulatory Visit | Attending: Family Medicine | Admitting: Family Medicine

## 2022-06-16 DIAGNOSIS — R2689 Other abnormalities of gait and mobility: Secondary | ICD-10-CM

## 2022-06-16 DIAGNOSIS — R29898 Other symptoms and signs involving the musculoskeletal system: Secondary | ICD-10-CM

## 2022-06-16 DIAGNOSIS — R159 Full incontinence of feces: Secondary | ICD-10-CM | POA: Diagnosis not present

## 2022-06-16 LAB — RHEUMATOID FACTOR: Rheumatoid fact SerPl-aCnc: <10 IU/mL (ref <14.0)

## 2022-06-20 LAB — ANTINUCLEAR ANTIBODIES, IFA: ANA Ab, IFA: NEGATIVE

## 2022-06-30 DIAGNOSIS — R531 Weakness: Secondary | ICD-10-CM | POA: Diagnosis not present

## 2022-07-05 ENCOUNTER — Emergency Department (HOSPITAL_COMMUNITY): Payer: BC Managed Care – PPO

## 2022-07-05 ENCOUNTER — Other Ambulatory Visit: Payer: Self-pay

## 2022-07-05 ENCOUNTER — Emergency Department (HOSPITAL_COMMUNITY)
Admission: EM | Admit: 2022-07-05 | Discharge: 2022-07-05 | Disposition: A | Discharge status: Home / Self Care | Payer: BC Managed Care – PPO | Source: Home / Self Care | Attending: Emergency Medicine | Admitting: Emergency Medicine

## 2022-07-05 ENCOUNTER — Encounter (HOSPITAL_COMMUNITY): Payer: Self-pay

## 2022-07-05 DIAGNOSIS — N3001 Acute cystitis with hematuria: Secondary | ICD-10-CM | POA: Insufficient documentation

## 2022-07-05 DIAGNOSIS — J811 Chronic pulmonary edema: Secondary | ICD-10-CM | POA: Diagnosis not present

## 2022-07-05 DIAGNOSIS — Z20822 Contact with and (suspected) exposure to covid-19: Secondary | ICD-10-CM | POA: Insufficient documentation

## 2022-07-05 DIAGNOSIS — I10 Essential (primary) hypertension: Secondary | ICD-10-CM | POA: Diagnosis not present

## 2022-07-05 DIAGNOSIS — G459 Transient cerebral ischemic attack, unspecified: Secondary | ICD-10-CM | POA: Diagnosis not present

## 2022-07-05 DIAGNOSIS — W19XXXA Unspecified fall, initial encounter: Secondary | ICD-10-CM | POA: Insufficient documentation

## 2022-07-05 DIAGNOSIS — R509 Fever, unspecified: Secondary | ICD-10-CM | POA: Diagnosis not present

## 2022-07-05 DIAGNOSIS — B20 Human immunodeficiency virus [HIV] disease: Secondary | ICD-10-CM | POA: Diagnosis not present

## 2022-07-05 DIAGNOSIS — R531 Weakness: Secondary | ICD-10-CM | POA: Diagnosis not present

## 2022-07-05 LAB — CBC WITH DIFFERENTIAL/PLATELET
Abs Immature Granulocytes: 0.02 10*3/uL (ref 0.00–0.07)
Basophils Absolute: 0 10*3/uL (ref 0.0–0.1)
Basophils Relative: 1 %
Eosinophils Absolute: 0.2 10*3/uL (ref 0.0–0.5)
Eosinophils Relative: 3 %
HCT: 37.4 % — ABNORMAL LOW (ref 39.0–52.0)
Hemoglobin: 12.6 g/dL — ABNORMAL LOW (ref 13.0–17.0)
Immature Granulocytes: 0 %
Lymphocytes Relative: 20 %
Lymphs Abs: 1.2 10*3/uL (ref 0.7–4.0)
MCH: 30.3 pg (ref 26.0–34.0)
MCHC: 33.7 g/dL (ref 30.0–36.0)
MCV: 89.9 fL (ref 80.0–100.0)
Monocytes Absolute: 0.7 10*3/uL (ref 0.1–1.0)
Monocytes Relative: 12 %
Neutro Abs: 3.9 10*3/uL (ref 1.7–7.7)
Neutrophils Relative %: 64 %
Platelets: 115 10*3/uL — ABNORMAL LOW (ref 150–400)
RBC: 4.16 MIL/uL — ABNORMAL LOW (ref 4.22–5.81)
RDW: 14.2 % (ref 11.5–15.5)
WBC: 6 10*3/uL (ref 4.0–10.5)
nRBC: 0 % (ref 0.0–0.2)

## 2022-07-05 LAB — LACTIC ACID, PLASMA
Lactic Acid, Venous: 0.7 mmol/L (ref 0.5–1.9)
Lactic Acid, Venous: 0.7 mmol/L (ref 0.5–1.9)

## 2022-07-05 LAB — COMPREHENSIVE METABOLIC PANEL
ALT: 19 U/L (ref 0–44)
AST: 16 U/L (ref 15–41)
Albumin: 3.3 g/dL — ABNORMAL LOW (ref 3.5–5.0)
Alkaline Phosphatase: 66 U/L (ref 38–126)
Anion gap: 9 (ref 5–15)
BUN: 11 mg/dL (ref 6–20)
CO2: 25 mmol/L (ref 22–32)
Calcium: 8.5 mg/dL — ABNORMAL LOW (ref 8.9–10.3)
Chloride: 102 mmol/L (ref 98–111)
Creatinine, Ser: 0.76 mg/dL (ref 0.61–1.24)
GFR, Estimated: >60 mL/min (ref >60)
Glucose, Bld: 97 mg/dL (ref 70–99)
Potassium: 3.2 mmol/L — ABNORMAL LOW (ref 3.5–5.1)
Sodium: 136 mmol/L (ref 135–145)
Total Bilirubin: 0.9 mg/dL (ref 0.3–1.2)
Total Protein: 6.7 g/dL (ref 6.5–8.1)

## 2022-07-05 LAB — URINALYSIS, W/ REFLEX TO CULTURE (INFECTION SUSPECTED)
Bilirubin Urine: NEGATIVE
Glucose, UA: NEGATIVE mg/dL
Ketones, ur: NEGATIVE mg/dL
Leukocytes,Ua: NEGATIVE
Nitrite: NEGATIVE
Protein, ur: 30 mg/dL — AB
RBC / HPF: >50 RBC/hpf (ref 0–5)
Specific Gravity, Urine: 1.01 (ref 1.005–1.030)
pH: 8 (ref 5.0–8.0)

## 2022-07-05 LAB — RESP PANEL BY RT-PCR (RSV, FLU A&B, COVID)  RVPGX2
Influenza A by PCR: NEGATIVE
Influenza B by PCR: NEGATIVE
Resp Syncytial Virus by PCR: NEGATIVE
SARS Coronavirus 2 by RT PCR: NEGATIVE

## 2022-07-05 LAB — PROTIME-INR
INR: 1.1 (ref 0.8–1.2)
Prothrombin Time: 14.1 seconds (ref 11.4–15.2)

## 2022-07-05 LAB — APTT: aPTT: 32 seconds (ref 24–36)

## 2022-07-05 MED ORDER — SODIUM CHLORIDE 0.9 % IV SOLN
2.0000 g | INTRAVENOUS | Status: DC
  Administered 2022-07-05: 2 g via INTRAVENOUS
  Filled 2022-07-05: qty 20

## 2022-07-05 MED ORDER — POTASSIUM CHLORIDE CRYS ER 20 MEQ PO TBCR
40.0000 meq | EXTENDED_RELEASE_TABLET | Freq: Once | ORAL | Status: AC
  Administered 2022-07-05: 40 meq via ORAL
  Filled 2022-07-05: qty 2

## 2022-07-05 MED ORDER — ACETAMINOPHEN 325 MG PO TABS
650.0000 mg | ORAL_TABLET | Freq: Once | ORAL | Status: AC
  Administered 2022-07-05: 650 mg via ORAL
  Filled 2022-07-05: qty 2

## 2022-07-05 MED ORDER — SODIUM CHLORIDE 0.9 % IV BOLUS
1000.0000 mL | Freq: Once | INTRAVENOUS | Status: AC
  Administered 2022-07-05: 1000 mL via INTRAVENOUS

## 2022-07-05 MED ORDER — CEPHALEXIN 500 MG PO CAPS: 500.0000 mg | ORAL_CAPSULE | Freq: Four times a day (QID) | ORAL | 0 refills | Status: DC

## 2022-07-05 NOTE — Discharge Instructions (Signed)
Follow up with your md next week. °

## 2022-07-05 NOTE — Sepsis Progress Note (Signed)
Elink following code sepsis °

## 2022-07-05 NOTE — ED Triage Notes (Signed)
Patient has had repeated falls over the last month with 2 happening last night. Patient was diagnosed as having a stroke earlier this month but still needs an MRI. Patient haws residual left sided weakness and complains of pain all over from the falls

## 2022-07-06 DIAGNOSIS — Z6833 Body mass index (BMI) 33.0-33.9, adult: Secondary | ICD-10-CM | POA: Diagnosis not present

## 2022-07-06 DIAGNOSIS — I10 Essential (primary) hypertension: Secondary | ICD-10-CM | POA: Diagnosis not present

## 2022-07-06 DIAGNOSIS — R29898 Other symptoms and signs involving the musculoskeletal system: Secondary | ICD-10-CM | POA: Diagnosis not present

## 2022-07-06 DIAGNOSIS — N390 Urinary tract infection, site not specified: Secondary | ICD-10-CM | POA: Diagnosis not present

## 2022-07-06 DIAGNOSIS — Z21 Asymptomatic human immunodeficiency virus [HIV] infection status: Secondary | ICD-10-CM | POA: Diagnosis not present

## 2022-07-06 DIAGNOSIS — R0902 Hypoxemia: Secondary | ICD-10-CM | POA: Diagnosis not present

## 2022-07-06 DIAGNOSIS — R159 Full incontinence of feces: Secondary | ICD-10-CM | POA: Diagnosis not present

## 2022-07-06 DIAGNOSIS — E1165 Type 2 diabetes mellitus with hyperglycemia: Secondary | ICD-10-CM | POA: Diagnosis not present

## 2022-07-06 DIAGNOSIS — G459 Transient cerebral ischemic attack, unspecified: Secondary | ICD-10-CM | POA: Diagnosis not present

## 2022-07-06 DIAGNOSIS — D509 Iron deficiency anemia, unspecified: Secondary | ICD-10-CM | POA: Diagnosis not present

## 2022-07-06 DIAGNOSIS — R531 Weakness: Secondary | ICD-10-CM | POA: Diagnosis not present

## 2022-07-06 DIAGNOSIS — R2689 Other abnormalities of gait and mobility: Secondary | ICD-10-CM | POA: Diagnosis not present

## 2022-07-06 DIAGNOSIS — E6609 Other obesity due to excess calories: Secondary | ICD-10-CM | POA: Diagnosis not present

## 2022-07-06 LAB — URINE CULTURE

## 2022-07-07 ENCOUNTER — Emergency Department (HOSPITAL_COMMUNITY): Payer: BC Managed Care – PPO

## 2022-07-07 ENCOUNTER — Other Ambulatory Visit: Payer: Self-pay

## 2022-07-07 ENCOUNTER — Inpatient Hospital Stay (HOSPITAL_COMMUNITY)
Admission: EM | Admit: 2022-07-07 | Discharge: 2022-07-14 | DRG: 975 | Disposition: A | Discharge status: Rehabilitation Facility | Payer: BC Managed Care – PPO | Attending: Internal Medicine | Admitting: Internal Medicine

## 2022-07-07 ENCOUNTER — Encounter (HOSPITAL_COMMUNITY): Payer: Self-pay

## 2022-07-07 DIAGNOSIS — B20 Human immunodeficiency virus [HIV] disease: Secondary | ICD-10-CM | POA: Diagnosis not present

## 2022-07-07 DIAGNOSIS — R519 Headache, unspecified: Secondary | ICD-10-CM | POA: Diagnosis not present

## 2022-07-07 DIAGNOSIS — Z79899 Other long term (current) drug therapy: Secondary | ICD-10-CM

## 2022-07-07 DIAGNOSIS — E1165 Type 2 diabetes mellitus with hyperglycemia: Secondary | ICD-10-CM | POA: Diagnosis not present

## 2022-07-07 DIAGNOSIS — R110 Nausea: Secondary | ICD-10-CM | POA: Diagnosis not present

## 2022-07-07 DIAGNOSIS — F419 Anxiety disorder, unspecified: Secondary | ICD-10-CM | POA: Diagnosis not present

## 2022-07-07 DIAGNOSIS — Z886 Allergy status to analgesic agent status: Secondary | ICD-10-CM | POA: Diagnosis not present

## 2022-07-07 DIAGNOSIS — I10 Essential (primary) hypertension: Secondary | ICD-10-CM | POA: Diagnosis present

## 2022-07-07 DIAGNOSIS — Z6832 Body mass index (BMI) 32.0-32.9, adult: Secondary | ICD-10-CM

## 2022-07-07 DIAGNOSIS — Z9181 History of falling: Secondary | ICD-10-CM

## 2022-07-07 DIAGNOSIS — K111 Hypertrophy of salivary gland: Secondary | ICD-10-CM | POA: Diagnosis present

## 2022-07-07 DIAGNOSIS — Z91148 Patient's other noncompliance with medication regimen for other reason: Secondary | ICD-10-CM

## 2022-07-07 DIAGNOSIS — R531 Weakness: Secondary | ICD-10-CM | POA: Diagnosis not present

## 2022-07-07 DIAGNOSIS — B003 Herpesviral meningitis: Secondary | ICD-10-CM | POA: Diagnosis present

## 2022-07-07 DIAGNOSIS — R569 Unspecified convulsions: Secondary | ICD-10-CM | POA: Diagnosis not present

## 2022-07-07 DIAGNOSIS — N3001 Acute cystitis with hematuria: Secondary | ICD-10-CM | POA: Diagnosis present

## 2022-07-07 DIAGNOSIS — E871 Hypo-osmolality and hyponatremia: Secondary | ICD-10-CM | POA: Diagnosis not present

## 2022-07-07 DIAGNOSIS — T50996D Underdosing of other drugs, medicaments and biological substances, subsequent encounter: Secondary | ICD-10-CM | POA: Diagnosis not present

## 2022-07-07 DIAGNOSIS — E785 Hyperlipidemia, unspecified: Secondary | ICD-10-CM | POA: Diagnosis not present

## 2022-07-07 DIAGNOSIS — F411 Generalized anxiety disorder: Secondary | ICD-10-CM | POA: Diagnosis not present

## 2022-07-07 DIAGNOSIS — D696 Thrombocytopenia, unspecified: Secondary | ICD-10-CM | POA: Diagnosis present

## 2022-07-07 DIAGNOSIS — E119 Type 2 diabetes mellitus without complications: Secondary | ICD-10-CM | POA: Diagnosis present

## 2022-07-07 DIAGNOSIS — Z515 Encounter for palliative care: Secondary | ICD-10-CM | POA: Diagnosis not present

## 2022-07-07 DIAGNOSIS — Z6829 Body mass index (BMI) 29.0-29.9, adult: Secondary | ICD-10-CM | POA: Diagnosis not present

## 2022-07-07 DIAGNOSIS — D649 Anemia, unspecified: Secondary | ICD-10-CM | POA: Diagnosis not present

## 2022-07-07 DIAGNOSIS — A812 Progressive multifocal leukoencephalopathy: Secondary | ICD-10-CM | POA: Diagnosis present

## 2022-07-07 DIAGNOSIS — G459 Transient cerebral ischemic attack, unspecified: Secondary | ICD-10-CM | POA: Diagnosis not present

## 2022-07-07 DIAGNOSIS — R2689 Other abnormalities of gait and mobility: Secondary | ICD-10-CM | POA: Diagnosis not present

## 2022-07-07 DIAGNOSIS — I4891 Unspecified atrial fibrillation: Secondary | ICD-10-CM | POA: Diagnosis not present

## 2022-07-07 DIAGNOSIS — J45909 Unspecified asthma, uncomplicated: Secondary | ICD-10-CM | POA: Diagnosis present

## 2022-07-07 DIAGNOSIS — J9811 Atelectasis: Secondary | ICD-10-CM | POA: Diagnosis not present

## 2022-07-07 DIAGNOSIS — R0789 Other chest pain: Secondary | ICD-10-CM | POA: Diagnosis not present

## 2022-07-07 DIAGNOSIS — G8194 Hemiplegia, unspecified affecting left nondominant side: Secondary | ICD-10-CM | POA: Diagnosis not present

## 2022-07-07 DIAGNOSIS — R32 Unspecified urinary incontinence: Secondary | ICD-10-CM | POA: Diagnosis not present

## 2022-07-07 DIAGNOSIS — K5900 Constipation, unspecified: Secondary | ICD-10-CM | POA: Diagnosis not present

## 2022-07-07 DIAGNOSIS — R296 Repeated falls: Secondary | ICD-10-CM | POA: Diagnosis present

## 2022-07-07 DIAGNOSIS — Z91199 Patient's noncompliance with other medical treatment and regimen due to unspecified reason: Secondary | ICD-10-CM

## 2022-07-07 DIAGNOSIS — R1310 Dysphagia, unspecified: Secondary | ICD-10-CM | POA: Diagnosis not present

## 2022-07-07 DIAGNOSIS — F02B3 Dementia in other diseases classified elsewhere, moderate, with mood disturbance: Secondary | ICD-10-CM | POA: Diagnosis not present

## 2022-07-07 DIAGNOSIS — E669 Obesity, unspecified: Secondary | ICD-10-CM | POA: Diagnosis present

## 2022-07-07 DIAGNOSIS — R5381 Other malaise: Secondary | ICD-10-CM | POA: Diagnosis not present

## 2022-07-07 DIAGNOSIS — Z8616 Personal history of COVID-19: Secondary | ICD-10-CM | POA: Diagnosis not present

## 2022-07-07 DIAGNOSIS — Z8673 Personal history of transient ischemic attack (TIA), and cerebral infarction without residual deficits: Secondary | ICD-10-CM | POA: Diagnosis not present

## 2022-07-07 DIAGNOSIS — K118 Other diseases of salivary glands: Secondary | ICD-10-CM | POA: Diagnosis not present

## 2022-07-07 DIAGNOSIS — G8114 Spastic hemiplegia affecting left nondominant side: Secondary | ICD-10-CM | POA: Diagnosis not present

## 2022-07-07 DIAGNOSIS — I480 Paroxysmal atrial fibrillation: Secondary | ICD-10-CM | POA: Diagnosis present

## 2022-07-07 DIAGNOSIS — B009 Herpesviral infection, unspecified: Secondary | ICD-10-CM | POA: Diagnosis not present

## 2022-07-07 DIAGNOSIS — H9191 Unspecified hearing loss, right ear: Secondary | ICD-10-CM | POA: Diagnosis not present

## 2022-07-07 DIAGNOSIS — R599 Enlarged lymph nodes, unspecified: Secondary | ICD-10-CM | POA: Diagnosis present

## 2022-07-07 DIAGNOSIS — R0682 Tachypnea, not elsewhere classified: Secondary | ICD-10-CM | POA: Diagnosis not present

## 2022-07-07 DIAGNOSIS — R29898 Other symptoms and signs involving the musculoskeletal system: Secondary | ICD-10-CM | POA: Diagnosis not present

## 2022-07-07 DIAGNOSIS — Z86711 Personal history of pulmonary embolism: Secondary | ICD-10-CM | POA: Diagnosis not present

## 2022-07-07 DIAGNOSIS — Z1152 Encounter for screening for COVID-19: Secondary | ICD-10-CM | POA: Diagnosis not present

## 2022-07-07 DIAGNOSIS — L89152 Pressure ulcer of sacral region, stage 2: Secondary | ICD-10-CM | POA: Diagnosis not present

## 2022-07-07 DIAGNOSIS — Z21 Asymptomatic human immunodeficiency virus [HIV] infection status: Secondary | ICD-10-CM | POA: Diagnosis not present

## 2022-07-07 DIAGNOSIS — I2699 Other pulmonary embolism without acute cor pulmonale: Secondary | ICD-10-CM | POA: Diagnosis not present

## 2022-07-07 DIAGNOSIS — E876 Hypokalemia: Secondary | ICD-10-CM | POA: Diagnosis present

## 2022-07-07 DIAGNOSIS — R000 Tachycardia, unspecified: Secondary | ICD-10-CM | POA: Diagnosis not present

## 2022-07-07 DIAGNOSIS — Z7189 Other specified counseling: Secondary | ICD-10-CM | POA: Diagnosis not present

## 2022-07-07 DIAGNOSIS — M25552 Pain in left hip: Secondary | ICD-10-CM | POA: Diagnosis not present

## 2022-07-07 DIAGNOSIS — Z91128 Patient's intentional underdosing of medication regimen for other reason: Secondary | ICD-10-CM | POA: Diagnosis not present

## 2022-07-07 DIAGNOSIS — R270 Ataxia, unspecified: Secondary | ICD-10-CM | POA: Diagnosis present

## 2022-07-07 LAB — URINALYSIS, ROUTINE W REFLEX MICROSCOPIC
Bacteria, UA: NONE SEEN
Bilirubin Urine: NEGATIVE
Glucose, UA: NEGATIVE mg/dL
Hgb urine dipstick: NEGATIVE
Ketones, ur: NEGATIVE mg/dL
Leukocytes,Ua: NEGATIVE
Nitrite: NEGATIVE
Protein, ur: 100 mg/dL — AB
Specific Gravity, Urine: 1.023 (ref 1.005–1.030)
pH: 6 (ref 5.0–8.0)

## 2022-07-07 LAB — CBC
HCT: 39.9 % (ref 39.0–52.0)
Hemoglobin: 13.1 g/dL (ref 13.0–17.0)
MCH: 30 pg (ref 26.0–34.0)
MCHC: 32.8 g/dL (ref 30.0–36.0)
MCV: 91.5 fL (ref 80.0–100.0)
Platelets: 112 10*3/uL — ABNORMAL LOW (ref 150–400)
RBC: 4.36 MIL/uL (ref 4.22–5.81)
RDW: 14.4 % (ref 11.5–15.5)
WBC: 4.1 10*3/uL (ref 4.0–10.5)
nRBC: 0 % (ref 0.0–0.2)

## 2022-07-07 LAB — RAPID URINE DRUG SCREEN, HOSP PERFORMED
Amphetamines: NOT DETECTED
Barbiturates: NOT DETECTED
Benzodiazepines: NOT DETECTED
Cocaine: NOT DETECTED
Opiates: NOT DETECTED
Tetrahydrocannabinol: NOT DETECTED

## 2022-07-07 LAB — DIFFERENTIAL
Abs Immature Granulocytes: 0.01 10*3/uL (ref 0.00–0.07)
Basophils Absolute: 0 10*3/uL (ref 0.0–0.1)
Basophils Relative: 1 %
Eosinophils Absolute: 0.2 10*3/uL (ref 0.0–0.5)
Eosinophils Relative: 6 %
Immature Granulocytes: 0 %
Lymphocytes Relative: 23 %
Lymphs Abs: 1 10*3/uL (ref 0.7–4.0)
Monocytes Absolute: 0.6 10*3/uL (ref 0.1–1.0)
Monocytes Relative: 14 %
Neutro Abs: 2.3 10*3/uL (ref 1.7–7.7)
Neutrophils Relative %: 56 %

## 2022-07-07 LAB — COMPREHENSIVE METABOLIC PANEL
ALT: 24 U/L (ref 0–44)
AST: 21 U/L (ref 15–41)
Albumin: 3.6 g/dL (ref 3.5–5.0)
Alkaline Phosphatase: 67 U/L (ref 38–126)
Anion gap: 8 (ref 5–15)
BUN: 14 mg/dL (ref 6–20)
CO2: 25 mmol/L (ref 22–32)
Calcium: 8.4 mg/dL — ABNORMAL LOW (ref 8.9–10.3)
Chloride: 102 mmol/L (ref 98–111)
Creatinine, Ser: 0.78 mg/dL (ref 0.61–1.24)
GFR, Estimated: >60 mL/min (ref >60)
Glucose, Bld: 95 mg/dL (ref 70–99)
Potassium: 3.4 mmol/L — ABNORMAL LOW (ref 3.5–5.1)
Sodium: 135 mmol/L (ref 135–145)
Total Bilirubin: 0.6 mg/dL (ref 0.3–1.2)
Total Protein: 7.7 g/dL (ref 6.5–8.1)

## 2022-07-07 LAB — I-STAT CHEM 8, ED
BUN: 12 mg/dL (ref 6–20)
Calcium, Ion: 1.15 mmol/L (ref 1.15–1.40)
Chloride: 101 mmol/L (ref 98–111)
Creatinine, Ser: 0.7 mg/dL (ref 0.61–1.24)
Glucose, Bld: 89 mg/dL (ref 70–99)
HCT: 38 % — ABNORMAL LOW (ref 39.0–52.0)
Hemoglobin: 12.9 g/dL — ABNORMAL LOW (ref 13.0–17.0)
Potassium: 3.6 mmol/L (ref 3.5–5.1)
Sodium: 140 mmol/L (ref 135–145)
TCO2: 28 mmol/L (ref 22–32)

## 2022-07-07 LAB — ETHANOL: Alcohol, Ethyl (B): <10 mg/dL (ref <10)

## 2022-07-07 LAB — PROTIME-INR
INR: 1 (ref 0.8–1.2)
Prothrombin Time: 13.7 seconds (ref 11.4–15.2)

## 2022-07-07 LAB — APTT: aPTT: 34 seconds (ref 24–36)

## 2022-07-07 NOTE — ED Triage Notes (Signed)
Patient BIB [facility] from home. Has had left sided weakness for a month. Alert and oriented x4. Was sent here for MRI.

## 2022-07-07 NOTE — ED Provider Notes (Signed)
[place] EMERGENCY DEPARTMENT AT [facility] Provider Note   CSN: [id number] Arrival date & time: 07/07/22  1624     History  Chief Complaint  Patient presents with   Weakness    Shane Rippeon is a 62 y.o. male.  62 year old male presents today for about 6 weeks duration of left-sided weakness.  He states on a couple weeks ago he was evaluated by his neurosurgeon and was recommended to follow-up with neurologist.  He has not had this evaluation yet.  He was seen at an outside emergency department yesterday and was recommended admission but he ultimately left because the MRI machine at that hospital was not working and he was told it may be several days before his back on line.  He returns today for additional evaluation.  Denies any vision change, difficulty with speech, facial droop.  Does have weakness to his left upper extremity and left lower extremity.  He also endorses falls.  Most recently on Monday.  He states this is purely mechanical.  Denies head injury.  Not on anticoagulation.  Current medication includes Keflex.  Due to recent UTI.  No other medications.   Weakness Associated symptoms: no abdominal pain, no dysuria, no fever, no headaches, no nausea, no shortness of breath and no vomiting        Home Medications Prior to Admission medications   Medication Sig Start Date End Date Taking? Authorizing Provider  albuterol (PROVENTIL) (2.5 MG/3ML) 0.083% nebulizer solution Take 2.5 mg by nebulization every 4 (four) hours. 12/01/21   [provider]  albuterol (VENTOLIN HFA) 108 (90 Base) MCG/ACT inhaler 1 puff every 4 (four) hours as needed for wheezing or shortness of breath. 10/23/19   [provider]  ascorbic acid (VITAMIN C) 500 MG tablet Take 1 tablet (500 mg total) by mouth daily. 04/10/22   Vassie Loll, MD  cephALEXin (KEFLEX) 500 MG capsule Take 1 capsule (500 mg total) by mouth 4 (four) times daily. 07/05/22   Bethann Berkshire, MD   oxyCODONE-acetaminophen (PERCOCET) 10-325 MG tablet Take 1 tablet by mouth every 6 (six) hours. 06/09/22   [provider]  predniSONE (STERAPRED UNI-PAK 48 TAB) 10 MG (48) TBPK tablet Take 1 tablet by mouth as directed. 06/23/22   [provider]      Allergies    Asa [aspirin]    Review of Systems   Review of Systems  Constitutional:  Negative for fever.  Respiratory:  Negative for shortness of breath.   Gastrointestinal:  Negative for abdominal pain, nausea and vomiting.  Genitourinary:  Negative for dysuria.  Neurological:  Positive for weakness. Negative for syncope, speech difficulty and headaches.    Physical Exam Updated Vital Signs BP 116/75 (BP Location: Right Arm)   Pulse 79   Temp 99.3 F (37.4 C) (Oral)   Resp 16   Ht 5\' 11"  (1.803 m)   Wt 104.3 kg   SpO2 99%   BMI 32.08 kg/m  Physical Exam Vitals and nursing note reviewed.  Constitutional:      General: He is not in acute distress.    Appearance: Normal appearance. He is not ill-appearing.  HENT:     Head: Normocephalic and atraumatic.     Nose: Nose normal.  Eyes:     Conjunctiva/sclera: Conjunctivae normal.  Cardiovascular:     Rate and Rhythm: Normal rate and regular rhythm.  Pulmonary:     Effort: Pulmonary effort is normal. No respiratory distress.  Abdominal:  General: There is no distension.     Palpations: Abdomen is soft.     Tenderness: There is no abdominal tenderness. There is no guarding.  Musculoskeletal:        General: No deformity.  Skin:    Findings: No rash.  Neurological:     Mental Status: He is alert and oriented to person, place, and time. Mental status is at baseline.     Comments: Cranial nerves III through XII intact.  Positive pronator drift on the left.  4/5 strength in the left upper extremity.  4/5 strength in the left lower extremity.  No facial droop.  Normal speech.     ED Results / Procedures / Treatments   Labs (all labs ordered are listed,  but only abnormal results are displayed) Labs Reviewed - No data to display  EKG None  Radiology No results found.  Procedures Procedures    Medications Ordered in ED Medications - No data to display  ED Course/ Medical Decision Making/ A&P Clinical Course as of 07/07/22 2328  Fri Jul 07, 2022  2251 This is a 62 year old male with a history of HIV, not on medications, presenting to the ED with complaint of left-sided weakness.  This been ongoing for about 6 weeks.  He has also had intermittent headaches, about every other day, lasting a few minutes in the back of his head.  No issues of confusion.  He does not have any evident weakness on exam.  Patient does have a history of poorly controlled HIV, tells me does not take HIV medications because of concerns that his medications may cause cancer.  His last CD4 count was less than 35 when checked in February.  He does not have any interest in resuming HIV medications at this time.  Patient did have MRI imaging of the brain which showed unusual appearance for infectious or inflammatory process, and in discussion with neurology, we have opted to proceed with further workup, including lumbar puncture, repeat MRI with contrast.  The patient was consented by myself at bedside for lumbar puncture, the risk and benefits explained, and he is agreeable with pursuing this. [MT]  2323 Patient was consented for lumbar puncture the risk and benefits discussed.  We attempted a bedside lumbar puncture, with 2 attempts, unsuccessful for CSF return.  Patient will be admitted to the hospital overnight for MRI of the brain with contrast per neurology recommendations, likely fluoroscopically guided lumbar puncture in the morning, which has been ordered.  CSF studies were ordered by the neurologist. [MT]    Clinical Course User Index [MT] Trifan, Kermit Balo, MD                             Medical Decision Making Amount and/or Complexity of Data Reviewed Labs:  ordered. Radiology: ordered.  Medical Decision Making / ED Course   This patient presents to the ED for concern of weakness, this involves an extensive number of treatment options, and is a complaint that carries with it a high risk of complications and morbidity.  The differential diagnosis includes CVA, intracranial infection, intracranial bleed, TIA  MDM: 62 year old male presents today for evaluation of 6 patient left-sided weakness he is without any confusion.  Does have intermittent headaches.  No vision change.  Without difficulty speech.  Initially discussed with neurology who recommended MRI and then call back with the results.  Will obtain other labs as well.  CBC is unremarkable.  CMP shows potassium 3.4 otherwise without acute concerns.  UA without evidence of UTI.  UDS unremarkable.  MRI with abnormal findings concerning for infectious or inflammatory process.  He does have history of HIV and is noncompliant with his medications.  Discussed with neurology.  They recommend high-volume spinal tap, and repeat MRI with contrast.  They recommend admitting patient to [facility] so neurology can follow.  6lp attempted by Dr. Renaye Rakers. Unsuccessful. Will place for IR to attempt.  Will discuss with hospitalist for admission.  Patient is in agreement with plan.  Lab Tests: -I ordered, reviewed, and interpreted labs.   The pertinent results include:   Labs Reviewed  CBC - Abnormal; Notable for the following components:      Result Value   Platelets 112 (*)    All other components within normal limits  COMPREHENSIVE METABOLIC PANEL - Abnormal; Notable for the following components:   Potassium 3.4 (*)    Calcium 8.4 (*)    All other components within normal limits  URINALYSIS, ROUTINE W REFLEX MICROSCOPIC - Abnormal; Notable for the following components:   Protein, ur 100 (*)    All other components within normal limits  I-STAT CHEM 8, ED - Abnormal; Notable for the following components:    Hemoglobin 12.9 (*)    HCT 38.0 (*)    All other components within normal limits  URINE CULTURE  CSF CULTURE W GRAM STAIN  ETHANOL  PROTIME-INR  APTT  DIFFERENTIAL  RAPID URINE DRUG SCREEN, HOSP PERFORMED  CSF CELL COUNT WITH DIFFERENTIAL  CSF CELL COUNT WITH DIFFERENTIAL  PROTEIN AND GLUCOSE, CSF  CRYPTOCOCCAL ANTIGEN, CSF  MENINGITIS/ENCEPHALITIS PANEL (CSF)  FLOW CYTOMETRY REQUEST - FLUID (INPATIENT)  MISC [facility] TEST (SEND OUT)  CYTOLOGY - NON PAP      EKG  EKG Interpretation  Date/Time:    Ventricular Rate:    PR Interval:    QRS Duration:   QT Interval:    QTC Calculation:   R Axis:     Text Interpretation:           Imaging Studies ordered: I ordered imaging studies including MRI brain without contrast.  MRI brain with contrast ordered but has not resulted prior to admission I independently visualized and interpreted imaging. I agree with the radiologist interpretation   Medicines ordered and prescription drug management: No orders of the defined types were placed in this encounter.   -I have reviewed the patients home medicines and have made adjustments as needed  Reevaluation: After the interventions noted above, I reevaluated the patient and found that they have :stayed the same  Co morbidities that complicate the patient evaluation  Past Medical History:  Diagnosis Date   Atrial fibrillation (HCC)    In the setting of hyperthyroidism   Bilateral pulmonary embolism (HCC)    Diagnosed August 2021   Diabetes mellitus without complication (HCC)    Essential hypertension    HIV antibody positive (HCC)    Perforated duodenal ulcer (HCC) 2015   Contained without need for surgical intervention   Pneumonia due to COVID-19 virus    Diagnosed August 2021      Dispostion: Patient discussed with hospitalist will evaluate patient for admission.    Final Clinical Impression(s) / ED Diagnoses Final diagnoses:  Weakness    Rx / DC Orders ED  Discharge Orders     None         Marita Kansas, [name] 07/07/22 2341    Terald Sleeper, MD 07/08/22  1354  

## 2022-07-07 NOTE — Plan of Care (Signed)
Called by [facility] hospital regarding this patient 6 weeks of left-sided weakness MRI brain with abnormal T2 signal cortical subcortical and right greater than left insular postcentral gyrus as well as splenium and body of corpus callosum and thalamus and midbrain.  Nonspecific but could reflect sequela of prior infarcts but is also concerning for infectious versus inflammatory process such as PML, autoimmune encephalitis herpes simplex and enteritis.    My recommendations are as follows: High-volume LP with labs that have been ordered by me in the chart including flow cytometry and cytology MRI brain with contrast Admit to [facility] for further workup  -- Milon Dikes, MD Neurologist [facility] Pager: [phone]

## 2022-07-07 NOTE — ED Provider Notes (Signed)
.  Lumbar Puncture  Date/Time: 07/07/2022 11:24 PM  Performed by: Terald Sleeper, MD Authorized by: Terald Sleeper, MD   Consent:    Consent obtained:  Verbal   Consent given by:  Patient   Risks, benefits, and alternatives were discussed: yes     Risks discussed:  Bleeding, headache, infection, nerve damage, pain and repeat procedure   Alternatives discussed:  Observation and referral Universal protocol:    Procedure explained and questions answered to patient or proxy's satisfaction: yes     Relevant documents present and verified: yes     Test results available: yes     Imaging studies available: yes     Required blood products, implants, devices, and special equipment available: yes     Immediately prior to procedure a time out was called: yes     Site/side marked: yes     Patient identity confirmed:  Arm band Pre-procedure details:    Procedure purpose:  Diagnostic   Preparation: Patient was prepped and draped in usual sterile fashion   Anesthesia:    Anesthesia method:  Local infiltration   Local anesthetic:  Lidocaine 1% w/o epi Procedure details:    Lumbar space:  L4-L5 interspace   Patient position:  Sitting   Needle gauge:  22   Ultrasound guidance: no     Number of attempts:  2 Post-procedure details:    Puncture site:  Adhesive bandage applied and direct pressure applied   Procedure completion:  Tolerated well, no immediate complications Comments:     Unsuccessful LP - unable to obtain CSF fluid     Terald Sleeper, MD 07/07/22 2324

## 2022-07-08 ENCOUNTER — Inpatient Hospital Stay (HOSPITAL_COMMUNITY): Payer: BC Managed Care – PPO

## 2022-07-08 DIAGNOSIS — A812 Progressive multifocal leukoencephalopathy: Secondary | ICD-10-CM

## 2022-07-08 DIAGNOSIS — R531 Weakness: Secondary | ICD-10-CM

## 2022-07-08 DIAGNOSIS — B20 Human immunodeficiency virus [HIV] disease: Secondary | ICD-10-CM

## 2022-07-08 DIAGNOSIS — G8194 Hemiplegia, unspecified affecting left nondominant side: Secondary | ICD-10-CM | POA: Diagnosis not present

## 2022-07-08 DIAGNOSIS — R519 Headache, unspecified: Secondary | ICD-10-CM | POA: Diagnosis not present

## 2022-07-08 LAB — BASIC METABOLIC PANEL
Anion gap: 5 (ref 5–15)
BUN: 13 mg/dL (ref 6–20)
CO2: 28 mmol/L (ref 22–32)
Calcium: 8.6 mg/dL — ABNORMAL LOW (ref 8.9–10.3)
Chloride: 105 mmol/L (ref 98–111)
Creatinine, Ser: 0.79 mg/dL (ref 0.61–1.24)
GFR, Estimated: >60 mL/min (ref >60)
Glucose, Bld: 104 mg/dL — ABNORMAL HIGH (ref 70–99)
Potassium: 3.2 mmol/L — ABNORMAL LOW (ref 3.5–5.1)
Sodium: 138 mmol/L (ref 135–145)

## 2022-07-08 LAB — CRYPTOCOCCAL ANTIGEN, CSF: Crypto Ag: NEGATIVE

## 2022-07-08 LAB — CBC
HCT: 37.7 % — ABNORMAL LOW (ref 39.0–52.0)
Hemoglobin: 12.1 g/dL — ABNORMAL LOW (ref 13.0–17.0)
MCH: 29.4 pg (ref 26.0–34.0)
MCHC: 32.1 g/dL (ref 30.0–36.0)
MCV: 91.7 fL (ref 80.0–100.0)
Platelets: 108 10*3/uL — ABNORMAL LOW (ref 150–400)
RBC: 4.11 MIL/uL — ABNORMAL LOW (ref 4.22–5.81)
RDW: 14.4 % (ref 11.5–15.5)
WBC: 3.9 10*3/uL — ABNORMAL LOW (ref 4.0–10.5)
nRBC: 0 % (ref 0.0–0.2)

## 2022-07-08 LAB — PROTEIN AND GLUCOSE, CSF
Glucose, CSF: 48 mg/dL (ref 40–70)
Total  Protein, CSF: 70 mg/dL — ABNORMAL HIGH (ref 15–45)

## 2022-07-08 LAB — MENINGITIS/ENCEPHALITIS PANEL (CSF)
Cryptococcus neoformans/gattii (CSF): NOT DETECTED
Cytomegalovirus (CSF): NOT DETECTED
Enterovirus (CSF): NOT DETECTED
Escherichia coli K1 (CSF): NOT DETECTED
Haemophilus influenzae (CSF): NOT DETECTED
Herpes simplex virus 1 (CSF): NOT DETECTED
Herpes simplex virus 2 (CSF): DETECTED — AB
Human herpesvirus 6 (CSF): NOT DETECTED
Human parechovirus (CSF): NOT DETECTED
Listeria monocytogenes (CSF): NOT DETECTED
Neisseria meningitis (CSF): NOT DETECTED
Streptococcus agalactiae (CSF): NOT DETECTED
Streptococcus pneumoniae (CSF): NOT DETECTED
Varicella zoster virus (CSF): NOT DETECTED

## 2022-07-08 LAB — CSF CELL COUNT WITH DIFFERENTIAL
Eosinophils, CSF: 1 % (ref 0–1)
Eosinophils, CSF: 1 % (ref 0–1)
Lymphs, CSF: 86 % — ABNORMAL HIGH (ref 40–80)
Lymphs, CSF: 89 % — ABNORMAL HIGH (ref 40–80)
Monocyte-Macrophage-Spinal Fluid: 10 % — ABNORMAL LOW (ref 15–45)
Monocyte-Macrophage-Spinal Fluid: 11 % — ABNORMAL LOW (ref 15–45)
RBC Count, CSF: 11 /mm3 — ABNORMAL HIGH
RBC Count, CSF: 2 /mm3 — ABNORMAL HIGH
Segmented Neutrophils-CSF: 2 % (ref 0–6)
Tube #: 1
Tube #: 4
WBC, CSF: 105 /mm3 (ref 0–5)
WBC, CSF: 108 /mm3 (ref 0–5)

## 2022-07-08 LAB — URINE CULTURE: Culture: <10000 — AB

## 2022-07-08 LAB — PHOSPHORUS: Phosphorus: 4 mg/dL (ref 2.5–4.6)

## 2022-07-08 LAB — MAGNESIUM: Magnesium: 2.3 mg/dL (ref 1.7–2.4)

## 2022-07-08 LAB — CSF CULTURE W GRAM STAIN

## 2022-07-08 MED ORDER — ALBUTEROL SULFATE HFA 108 (90 BASE) MCG/ACT IN AERS: 1.0000 | INHALATION_SPRAY | RESPIRATORY_TRACT | Status: DC | PRN

## 2022-07-08 MED ORDER — DEXTROSE 5 % IV SOLN
1000.0000 mg | Freq: Three times a day (TID) | INTRAVENOUS | Status: DC
  Filled 2022-07-08 (×3): qty 20

## 2022-07-08 MED ORDER — ALBUTEROL SULFATE (2.5 MG/3ML) 0.083% IN NEBU
2.5000 mg | INHALATION_SOLUTION | RESPIRATORY_TRACT | Status: DC
  Administered 2022-07-08: 2.5 mg via RESPIRATORY_TRACT
  Filled 2022-07-08: qty 3

## 2022-07-08 MED ORDER — HYDROMORPHONE HCL 1 MG/ML IJ SOLN
0.5000 mg | INTRAMUSCULAR | Status: AC | PRN
  Administered 2022-07-09 – 2022-07-14 (×4): 0.5 mg via INTRAVENOUS
  Filled 2022-07-08 (×4): qty 0.5

## 2022-07-08 MED ORDER — ALBUTEROL SULFATE (2.5 MG/3ML) 0.083% IN NEBU: 2.5000 mg | INHALATION_SOLUTION | RESPIRATORY_TRACT | Status: DC | PRN

## 2022-07-08 MED ORDER — OXYCODONE HCL 5 MG PO TABS
5.0000 mg | ORAL_TABLET | Freq: Four times a day (QID) | ORAL | Status: DC | PRN
  Administered 2022-07-09 – 2022-07-14 (×3): 5 mg via ORAL
  Filled 2022-07-08 (×3): qty 1

## 2022-07-08 MED ORDER — ACETAMINOPHEN 325 MG PO TABS
650.0000 mg | ORAL_TABLET | Freq: Four times a day (QID) | ORAL | Status: DC | PRN
  Administered 2022-07-09: 650 mg via ORAL
  Filled 2022-07-08: qty 2

## 2022-07-08 MED ORDER — ORAL CARE MOUTH RINSE: 15.0000 mL | OROMUCOSAL | Status: DC | PRN

## 2022-07-08 MED ORDER — HEPARIN SODIUM (PORCINE) 5000 UNIT/ML IJ SOLN
5000.0000 [IU] | Freq: Three times a day (TID) | INTRAMUSCULAR | Status: DC
  Administered 2022-07-08 – 2022-07-14 (×18): 5000 [IU] via SUBCUTANEOUS
  Filled 2022-07-08 (×19): qty 1

## 2022-07-08 MED ORDER — DEXTROSE 5 % IV SOLN
10.0000 mg/kg | Freq: Three times a day (TID) | INTRAVENOUS | Status: DC
  Administered 2022-07-08 – 2022-07-13 (×13): 870 mg via INTRAVENOUS
  Filled 2022-07-08 (×16): qty 17.4

## 2022-07-08 MED ORDER — GADOBUTROL 1 MMOL/ML IV SOLN
10.0000 mL | Freq: Once | INTRAVENOUS | Status: AC | PRN
  Administered 2022-07-08: 10 mL via INTRAVENOUS

## 2022-07-08 MED ORDER — LACTATED RINGERS IV SOLN: INTRAVENOUS | Status: AC

## 2022-07-08 MED ORDER — PROCHLORPERAZINE EDISYLATE 10 MG/2ML IJ SOLN: 5.0000 mg | Freq: Four times a day (QID) | INTRAMUSCULAR | Status: DC | PRN

## 2022-07-08 MED ORDER — POLYETHYLENE GLYCOL 3350 17 G PO PACK: 17.0000 g | PACK | Freq: Every day | ORAL | Status: DC | PRN

## 2022-07-08 MED ORDER — LACTATED RINGERS IV SOLN: INTRAVENOUS | Status: DC

## 2022-07-08 MED ORDER — ALBUTEROL SULFATE (2.5 MG/3ML) 0.083% IN NEBU
2.5000 mg | INHALATION_SOLUTION | Freq: Three times a day (TID) | RESPIRATORY_TRACT | Status: DC
  Administered 2022-07-08 – 2022-07-09 (×3): 2.5 mg via RESPIRATORY_TRACT
  Filled 2022-07-08 (×3): qty 3

## 2022-07-08 NOTE — ED Notes (Signed)
Assumed care of patient. Patient resting comfortably in bed with no signs of acute distress noted. Waiting on transfer to [facility].

## 2022-07-08 NOTE — Progress Notes (Addendum)
Pharmacy Antibiotic Note  Jose Lamb is a 62 y.o. male admitted on 07/07/2022 with weakness. Pharmacy has been consulted for acyclovir dosing while ruling out herpes encephalitis. TBW almost >140% IBW so will use AdBW. Infectious labs pending.  Plan: Acyclovir 10mg /kg q8h LR 125 ml/h  Height: 5\' 11"  (180.3 cm) Weight: 104.3 kg (230 lb) IBW/kg (Calculated) : 75.3  Temp (24hrs), Avg:98.7 F (37.1 C), Min:98.4 F (36.9 C), Max:99.2 F (37.3 C)  Recent Labs  Lab 07/05/22 0919 07/05/22 0946 07/05/22 1141 07/07/22 1819 07/07/22 1841 07/08/22 0500  WBC 6.0  --   --  4.1  --  3.9*  CREATININE 0.76  --   --  0.78 0.70 0.79  LATICACIDVEN  --  0.7 0.7  --   --   --     Estimated Creatinine Clearance: 120.7 mL/min (by C-G formula based on SCr of 0.79 mg/dL).    Allergies  Allergen Reactions   Asa [Aspirin] Other (See Comments)    Nose bleeds    Fredonia Highland, PharmD, BCPS, [facility] Clinical Pharmacist [phone] Please check AMION for all [facility] numbers 07/08/2022

## 2022-07-08 NOTE — ED Notes (Signed)
IR ready for patient. Will take patient to main xray per IR team.

## 2022-07-08 NOTE — H&P (Addendum)
History and Physical  Jose Lamb ZOX:[id number] DOB: 1960/07/12 DOA: 07/07/2022  Referring physician: Marita Kansas, PA-EDP  PCP: Jose Lamb  Outpatient Specialists: Orthopedic surgery, general surgery Patient coming from: Home lives with his wife.  Chief Complaint: Left-sided weakness x 6 weeks.  HPI: Jose Lamb is a 62 y.o. male with medical history significant for prior CVA, paroxysmal atrial fibrillation not on oral anticoagulation, asthma, HIV with noncompliance, history of pulmonary embolism, who presents to [facility] ED with complaints of 6 weeks of left-sided weakness involving his left arm and left leg.  Associated with frequent falls.  Has not been taking his ART medications.  In the ED, brain MRI revealed the following findings: 1. Abnormal T2 hyperintense signal, primarily cortical/subcortical, in the right greater than left insula and postcentral gyrus, as well as in the splenium and left body of the corpus callosum and possibly the right thalamus and midbrain. This is nonspecific, and while this could reflect sequela of prior infarcts, it is also concerning for an inflammatory or infectious process, such as PML, autoimmune encephalitis, or herpes simplex encephalitis, although this is not particularly typical of any the entities. MRI with contrast could be helpful. Correlate with symptoms and consider CSF testing. 2. Bilateral parotid masses are better seen on the prior CT and are incompletely imaged on this exam.  EDP discussed the case with neurology, Dr. Wilford Lamb, recommended admission to [facility].  Lumbar puncture was attempted in the ED but unsuccessful.  LP will be completed under fluoroscopy at [facility].  Hold off on antibiotics for now.  Admitted by [facility], hospitalist service.  ED Course: Temperature 98.4.  BP 136/85, pulse 84, respiratory rate 19, saturation 98% on room air.  Lab studies remarkable for platelet count 112.  Review of  Systems: Review of systems as noted in the HPI. All other systems reviewed and are negative.   Past Medical History:  Diagnosis Date   Atrial fibrillation (HCC)    In the setting of hyperthyroidism   Bilateral pulmonary embolism [facility])    Diagnosed August 2021   Diabetes mellitus without complication (HCC)    Essential hypertension    HIV antibody positive (HCC)    Perforated duodenal ulcer (HCC) 2015   Contained without need for surgical intervention   Pneumonia due to COVID-19 virus    Diagnosed August 2021   Past Surgical History:  Procedure Laterality Date   COLONOSCOPY  Sept 2015   [place]: transverse colon polyp with pathology reporting lymphoid nodule    ESOPHAGOGASTRODUODENOSCOPY N/A 03/30/2014   Procedure: ESOPHAGOGASTRODUODENOSCOPY (EGD);  Surgeon: Jose Ade, MD;  Location: [facility] ENDO SUITE;  Service: Endoscopy;  Laterality: N/A;  215   UMBILICAL HERNIA REPAIR N/A 12/16/2021   Procedure: HERNIA REPAIR UMBILICAL ADULT WITH MESH;  Surgeon: Jose Macho, MD;  Location: [facility] ORS;  Service: General;  Laterality: N/A;    Social History:  reports that he has never smoked. He has never used smokeless tobacco. He reports that he does not drink alcohol and does not use drugs.   Allergies  Allergen Reactions   Asa [Aspirin] Other (See Comments)    Nose bleeds    Family History  Problem Relation Age of Onset   Ulcers Mother    Heart Problems Mother    Colon cancer Neg Hx       Prior to Admission medications   Medication Sig Start Date End Date Taking? Authorizing Provider  albuterol (PROVENTIL) (2.5 MG/3ML) 0.083% nebulizer solution Take  2.5 mg by nebulization every 4 (four) hours. 12/01/21   [provider]  albuterol (VENTOLIN HFA) 108 (90 Base) MCG/ACT inhaler 1 puff every 4 (four) hours as needed for wheezing or shortness of breath. 10/23/19   [provider]  ascorbic acid (VITAMIN C) 500 MG tablet Take 1 tablet (500 mg total) by mouth daily.  04/10/22   Jose Loll, MD  cephALEXin (KEFLEX) 500 MG capsule Take 1 capsule (500 mg total) by mouth 4 (four) times daily. 07/05/22   Jose Berkshire, MD  oxyCODONE-acetaminophen (PERCOCET) 10-325 MG tablet Take 1 tablet by mouth every 6 (six) hours. 06/09/22   [provider]  predniSONE (STERAPRED UNI-PAK 48 TAB) 10 MG (48) TBPK tablet Take 1 tablet by mouth as directed. 06/23/22   [provider]    Physical Exam: BP 134/77   Pulse 86   Temp 98.4 F (36.9 C) (Oral)   Resp (!) 26   Ht 5\' 11"  (1.803 m)   Wt 104.3 kg   SpO2 99%   BMI 32.08 kg/m   General: 62 y.o. year-old male well developed well nourished in no acute distress.  Alert and oriented x3. Cardiovascular: Regular rate and rhythm with no rubs or gallops.  No thyromegaly or JVD noted.  No lower extremity edema. 2/4 pulses in all 4 extremities. Respiratory: Clear to auscultation with no wheezes or rales. Good inspiratory effort. Abdomen: Soft nontender nondistended with normal bowel sounds x4 quadrants. Muskuloskeletal: No cyanosis, clubbing or edema noted bilaterally Neuro: 4-5 strength in left upper and left lower extremity. Skin: No ulcerative lesions noted or rashes Psychiatry: Judgement and insight appear normal. Mood is appropriate for condition and setting          Labs on Admission:  Basic Metabolic Panel: Recent Labs  Lab 07/05/22 0919 07/07/22 1819 07/07/22 1841  NA 136 135 140  K 3.2* 3.4* 3.6  CL 102 102 101  CO2 25 25  --   GLUCOSE 97 95 89  BUN 11 14 12   CREATININE 0.76 0.78 0.70  CALCIUM 8.5* 8.4*  --    Liver Function Tests: Recent Labs  Lab 07/05/22 0919 07/07/22 1819  AST 16 21  ALT 19 24  ALKPHOS 66 67  BILITOT 0.9 0.6  PROT 6.7 7.7  ALBUMIN 3.3* 3.6   No results for input(s): "LIPASE", "AMYLASE" in the last 168 hours. No results for input(s): "AMMONIA" in the last 168 hours. CBC: Recent Labs  Lab 07/05/22 0919 07/07/22 1819 07/07/22 1841  WBC 6.0 4.1  --    NEUTROABS 3.9 2.3  --   HGB 12.6* 13.1 12.9*  HCT 37.4* 39.9 38.0*  MCV 89.9 91.5  --   PLT 115* 112*  --    Cardiac Enzymes: No results for input(s): "CKTOTAL", "CKMB", "CKMBINDEX", "TROPONINI" in the last 168 hours.  BNP (last 3 results) Recent Labs    12/01/21 0920 12/22/21 1307  BNP 362.0* 122.0*    ProBNP (last 3 results) No results for input(s): "PROBNP" in the last 8760 hours.  CBG: No results for input(s): "GLUCAP" in the last 168 hours.  Radiological Exams on Admission: MR BRAIN WO CONTRAST  Result Date: 07/07/2022 CLINICAL DATA:  Transient ischemic attack EXAM: MRI HEAD WITHOUT CONTRAST TECHNIQUE: Multiplanar, multiecho pulse sequences of the brain and surrounding structures were obtained without intravenous contrast. COMPARISON:  None Available. FINDINGS: Brain: Increased T2 hyperintense signal along the right-greater-than-left insula (series 9, image 27), which is associated with mildly increased signal on diffusion-weighted imaging,  with decreased intensity on the ADC map. On the right, this is associated with a more focal area of T1 and T2 hyperintense signal, with surrounding edema (series 8, image 18, series 9, image 35 and series 12, image 111). T2 hyperintense signal is also noted in right thalamus, extending into the midbrain (series 4, images 24-25), although this could be related to wallerian degeneration. Similar abnormal cortical signal is noted in the right postcentral gyrus (series 9, image 46), which is associated with a similar T2 hyperintense, T1 hypointense area with peripheral edema in the right frontoparietal region (series 9, image 39). Less significant T2 hyperintense signal is noted in the left postcentral gyrus (series 9, image 36), without the more superior cortical involvement. Increased T2 signal in the splenium of the corpus callosum (series 9, image 31) and in the left body (series 9, image 35), which is not definitively associated with restricted  diffusion. No acute hemorrhage, mass, mass effect, or midline shift. No hydrocephalus or extra-axial collection. Partial empty sella. Craniocervical junction. No hemosiderin deposition to suggest remote hemorrhage. Vascular: Normal arterial flow voids. Skull and upper cervical spine: Normal marrow signal. Sinuses/Orbits: Mucosal thickening left maxillary sinus. No acute finding in the orbits. Other: The mastoid air cells are well aerated. Bilateral parotid masses are better seen on the prior CT and are incompletely imaged on this exam IMPRESSION: 1. Abnormal T2 hyperintense signal, primarily cortical/subcortical, in the right greater than left insula and postcentral gyrus, as well as in the splenium and left body of the corpus callosum and possibly the right thalamus and midbrain. This is nonspecific, and while this could reflect sequela of prior infarcts, it is also concerning for an inflammatory or infectious process, such as PML, autoimmune encephalitis, or herpes simplex encephalitis, although this is not particularly typical of any the entities. MRI with contrast could be helpful. Correlate with symptoms and consider CSF testing. 2. Bilateral parotid masses are better seen on the prior CT and are incompletely imaged on this exam. These results were called by telephone at the time of interpretation on 07/07/2022 at 10:30 pm to provider [facility] , who verbally acknowledged these results. Electronically Signed   By: Wiliam Ke M.D.   On: 07/07/2022 22:39    EKG: I independently viewed the EKG done and my findings are as followed: None available at the time of this dictation.  Assessment/Plan Present on Admission: **None**  Principal Problem:   Left-sided weakness  Left-sided weakness, unclear etiology MRI with findings as stated above Plan for LP under fluoroscopy at [facility] Neurology consulted and recommended transfer to [facility], neurology will see in consultation. Continue  fall precautions Will need PT/OT evaluation Fall precautions. Follow MRI brain with contrast and other workup tests ordered by neurology  HIV with noncompliance to ART. Obtain viral load and CD4 count.  Asthma Resume home regimen  Obesity BMI 32 Recommend weight loss outpatient with regular physical activity and healthy dieting.   DVT prophylaxis: Subcu heparin 3 times daily due to possible procedure.  Code Status: Full code.  Family Communication: None at bedside.  Disposition Plan: Admitted to telemetry medical unit.  Consults called: None.  Admission status: Inpatient status.   Status is: Inpatient The patient requires at least 2 midnights for further evaluation and treatment of present condition.   Darlin Drop MD [facility] Pager [phone]  If 7PM-7AM, please contact night-coverage [email or web address] Password [facility]  07/08/2022, 2:55 AM

## 2022-07-08 NOTE — Consult Note (Signed)
Neurology Consultation  Reason for Consult: Left sided weakness/abnormal MRI brain  Referring Physician: Dr. Benjamine Mola   CC: left side weakness x 6 weeks   History is obtained from:patient and medical record   HPI: Jose Lamb is a 62 y.o. male with past medical history of  A fib not on AC, PE, DM, HTN, HIV noncompliant with medications who presents to [facility] ED for left side weakness and frequent falls that has been ongoing and getting progressively worse over the last 6 weeks. He endorses not taking medications for quite a few years. He denies any facial droop, vision changes, headaches, CP or SOB  MRI brain revealed Abnormal T2 hyperintense signal, primarily cortical/subcortical, in the right greater than left insula and postcentral gyrus, as well as in the splenium and left body of the corpus callosum and possibly the right thalamus and midbrain, concerning for an inflammatory or infectious process, such as PML, autoimmune encephalitis, or herpes simplex encephalitis. Subtle associated abnormal DWI signal in an irregular rim surrounding the lesions. LP was obtained today with IR. CSF results are pending    ROS: Full ROS was performed and is negative except as noted in the HPI.    Past Medical History:  Diagnosis Date   Atrial fibrillation (HCC)    In the setting of hyperthyroidism   Bilateral pulmonary embolism [facility])    Diagnosed August 2021   Diabetes mellitus without complication (HCC)    Essential hypertension    HIV antibody positive (HCC)    Perforated duodenal ulcer (HCC) 2015   Contained without need for surgical intervention   Pneumonia due to COVID-19 virus    Diagnosed August 2021     Family History  Problem Relation Age of Onset   Ulcers Mother    Heart Problems Mother    Colon cancer Neg Hx      Social History:   reports that he has never smoked. He has never used smokeless tobacco. He reports that he does not drink alcohol and does not use  drugs.  Medications  Current Facility-Administered Medications:    acetaminophen (TYLENOL) tablet 650 mg, 650 mg, Oral, Q6H PRN, Hall, Carole N, DO   albuterol (PROVENTIL) (2.5 MG/3ML) 0.083% nebulizer solution 2.5 mg, 2.5 mg, Nebulization, Q4H PRN, Hall, Carole N, DO   albuterol (PROVENTIL) (2.5 MG/3ML) 0.083% nebulizer solution 2.5 mg, 2.5 mg, Nebulization, TID, Vann, Jessica U, DO   heparin injection 5,000 Units, 5,000 Units, Subcutaneous, Q8H, Hall, Carole N, DO   HYDROmorphone (DILAUDID) injection 0.5 mg, 0.5 mg, Intravenous, Q4H PRN, Hall, Carole N, DO   lactated ringers infusion, , Intravenous, Continuous, Darlin Drop, DO, Last Rate: 50 mL/hr at 07/08/22 0248, New Bag at 07/08/22 0248   oxyCODONE (Oxy IR/ROXICODONE) immediate release tablet 5 mg, 5 mg, Oral, Q6H PRN, Hall, Carole N, DO   polyethylene glycol (MIRALAX / GLYCOLAX) packet 17 g, 17 g, Oral, Daily PRN, Hall, Carole N, DO   prochlorperazine (COMPAZINE) injection 5 mg, 5 mg, Intravenous, Q6H PRN, Darlin Drop, DO  Current Outpatient Medications:    albuterol (PROVENTIL) (2.5 MG/3ML) 0.083% nebulizer solution, Take 2.5 mg by nebulization every 4 (four) hours., Disp: , Rfl:    albuterol (VENTOLIN HFA) 108 (90 Base) MCG/ACT inhaler, 1 puff every 4 (four) hours as needed for wheezing or shortness of breath., Disp: , Rfl:    ascorbic acid (VITAMIN C) 500 MG tablet, Take 1 tablet (500 mg total) by mouth daily., Disp: 30 tablet, Rfl: 1   cephALEXin (  KEFLEX) 500 MG capsule, Take 1 capsule (500 mg total) by mouth 4 (four) times daily., Disp: 28 capsule, Rfl: 0   oxyCODONE-acetaminophen (PERCOCET) 10-325 MG tablet, Take 1 tablet by mouth every 6 (six) hours., Disp: , Rfl:    Exam: Current vital signs: BP (!) 141/112   Pulse 94   Temp 98.6 F (37 C) (Oral)   Resp (!) 22   Ht 5\' 11"  (1.803 m)   Wt 104.3 kg   SpO2 97%   BMI 32.08 kg/m  Vital signs in last 24 hours: Temp:  [98.4 F (36.9 C)-99.3 F (37.4 C)] 98.6 F (37  C) (06/01 1030) Pulse Rate:  [66-94] 94 (06/01 1030) Resp:  [14-26] 22 (06/01 1030) BP: (109-141)/(72-112) 141/112 (06/01 1030) SpO2:  [92 %-100 %] 97 % (06/01 1030) Weight:  [104.3 kg] 104.3 kg (05/31 1632)  GENERAL: Awake, alert in NAD HEENT: - Normocephalic and atraumatic, dry mm LUNGS - Clear to auscultation bilaterally with no wheezes CV - S1S2 RRR, no m/r/g, equal pulses bilaterally. ABDOMEN - Soft, nontender, nondistended with normoactive BS Ext: warm, well perfused, intact peripheral pulses, no edema  NEURO:  Mental Status: AA&Ox4 Language: speech is clear.  Naming, repetition, fluency, and comprehension intact. Cranial Nerves: PERRL EOMI, visual fields full, no facial asymmetry, facial sensation intact, hearing intact, tongue/uvula/soft palate midline, normal sternocleidomastoid and trapezius muscle strength. No evidence of tongue atrophy or fasciculations Motor: [name] 4/5 and decreased left grip rated as 3/5; LLE 1-2/5 RUE 5/5, RLE 5/5 Tone is decreased on the left. Bulk is normal Sensation- Decreased on left side, worse on left leg Coordination: mild ataxia with FNF on the left. No ataxia on the right. Unable to perform H-S  Gait- Unable to assess   Labs I have reviewed labs in epic and the results pertinent to this consultation are:  CBC    Component Value Date/Time   WBC 3.9 (L) 07/08/2022 0500   RBC 4.11 (L) 07/08/2022 0500   HGB 12.1 (L) 07/08/2022 0500   HCT 37.7 (L) 07/08/2022 0500   PLT 108 (L) 07/08/2022 0500   MCV 91.7 07/08/2022 0500   MCH 29.4 07/08/2022 0500   MCHC 32.1 07/08/2022 0500   RDW 14.4 07/08/2022 0500   LYMPHSABS 1.0 07/07/2022 1819   MONOABS 0.6 07/07/2022 1819   EOSABS 0.2 07/07/2022 1819   BASOSABS 0.0 07/07/2022 1819    CMP     Component Value Date/Time   NA 138 07/08/2022 0500   K 3.2 (L) 07/08/2022 0500   CL 105 07/08/2022 0500   CO2 28 07/08/2022 0500   GLUCOSE 104 (H) 07/08/2022 0500   BUN 13 07/08/2022 0500   CREATININE  0.79 07/08/2022 0500   CALCIUM 8.6 (L) 07/08/2022 0500   PROT 7.7 07/07/2022 1819   ALBUMIN 3.6 07/07/2022 1819   AST 21 07/07/2022 1819   ALT 24 07/07/2022 1819   ALKPHOS 67 07/07/2022 1819   BILITOT 0.6 07/07/2022 1819   GFRNONAA >60 07/08/2022 0500   GFRAA >60 09/19/2019 0657    Lipid Panel     Component Value Date/Time   CHOL 197 04/10/2022 1130   TRIG 158 (H) 04/10/2022 1130   HDL 39 (L) 04/10/2022 1130   CHOLHDL 5.1 04/10/2022 1130   VLDL 32 04/10/2022 1130   LDLCALC 126 (H) 04/10/2022 1130    Imaging I have reviewed the images obtained:   Assessment: 62 y.o. male with past medical history of  A fib not on AC, PE, DM, HTN, HIV noncompliant  with medications who presents to [facility] ED for left side weakness and frequent falls that has been ongoing and getting progressively worse over the last 6 weeks - On exam he is alert and oriented, [name] 4/5, LLE 2/5, not able to lift off bed but can move horizontally, Right hemibody 5/5, mild ataxia in left arm and decreased sensation on left hemibody worse on left leg  - MRI brain Abnormal T2 hyperintense signal, primarily cortical/subcortical, in the right greater than left insula and postcentral gyrus, as well as in the splenium and left body of the corpus callosum and possibly the right thalamus and midbrain, concerning for an inflammatory or infectious process, such as PML, autoimmune encephalitis, or herpes simplex encephalitis. Subtle associated abnormal DWI signal in an irregular rim surrounding the lesions. - LP done under Fluoro today. CSF labs sent   Recommendations: - MRI brain with contrast (add on study to assess for possible peripheral enhancement of the lesions) - Follow CSF labs - protein and glucose, cell count, culture with gram stain, ME panel, autoimmune encephalitis panel, cytometry, cytology, IgG index, cryptococcal antigen, JC virus - PT/OT  - ID consult - Neurology will continue to follow   Gevena Mart DNP, ACNPC-AG   [facility]  Electronically signed: Dr. Caryl Pina

## 2022-07-08 NOTE — Procedures (Signed)
PROCEDURE SUMMARY:  Successful fluoroscopic guided lumbar puncture. No immediate complications.  Pt tolerated well.   EBL = none  Please see full dictation in imaging section of Epic for procedure details.  Electronically signed by: Kamree Wiens Sanders Aysel Gilchrest, [name] 02/02/2021 9:41 AM     

## 2022-07-08 NOTE — Progress Notes (Addendum)
Patient admitted after midnight, please see H&P.  EDP discussed the case with neurology, Dr. Wilford Corner, recommended admission to [facility].  Lumbar puncture was attempted in the ED but unsuccessful.  LP will be completed under fluoroscopy at [facility].  Hold off on antibiotics for now.  - will ask ID to see pending CD4/viral load counts  Marlin Canary DO

## 2022-07-08 NOTE — ED Notes (Signed)
Patient arrived back from IR. Patient tolerated procedure well.

## 2022-07-08 NOTE — Consult Note (Signed)
[facility]    Date of Admission:  07/07/2022     Reason for Consult: Advanced HIV disease, concern for PML     Referring Physician: Dr. Benjamine Mola  Current antibiotics: None  ASSESSMENT:    62 y.o. male admitted with:  Left-sided weakness: Associated with frequent falls and MRI 07/07/2022 concerning for the possibility of PML versus other infectious/inflammatory process.  Status post LP today with lymphocyte predominant pleocytosis, glucose 48, protein 70.  Meningitis panel pending, CSF Crypto Ag pending, JC virus PCR pending, cytology pending. HIV disease: Patient with known and longstanding history of HIV and not on ART for several years.  Previously followed by ID at [facility].  Viral load and CD4 count pending from this admission.  Per review of prior ID notes at [facility], he was diagnosed in 38.  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  He has a history of PCP pneumonia 1995.  Most recent regimen was Descovy, Darnunavir/r, and Etravirine.  Last VL in 11/2015 was 60,094 copies, CD4 count 391.  Genotyping done that date as well, but unable to view in Care Everywhere.  Again, has been out of care since 2017 it appears.  Prior to falling out of care, it appears that his HIV was under reasonable control until that time.  He continued to be viremic with last VL of 328,000 copies in Nov 2023. 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  He has a CTA head and neck from 07/06/22 at [facility] (left [facility]) noting these masses and recommending ENT consult.  Patient states these have never been biopsied and/or cultured.   RECOMMENDATIONS:    Follow up CSF studies Concerning for PML based on MRI and history of non-adherence.  CD4 count and VL pending but anticipate very low CD4 count given almost 10 years of non-adherence If found to have PML, only treatment is ART which he has been hesitant to take in the past and may need palliative care  involvement  Will hold off on starting ART until more CSF information back such as Crypto Ag and MRI brain with contrast done.  Have reached out to IR about opening pressure as well ENT consult for parotid masses to see about biopsy for pathology, special stains for fungal/AFB, and cultures for fungal/AFB Serum RPR Following   Principal Problem:   Left-sided weakness   MEDICATIONS:    Scheduled Meds:  albuterol  2.5 mg Nebulization TID   heparin  5,000 Units Subcutaneous Q8H   Continuous Infusions:  lactated ringers 50 mL/hr at 07/08/22 0248   PRN Meds:.acetaminophen, albuterol, HYDROmorphone (DILAUDID) injection, oxyCODONE, polyethylene glycol, prochlorperazine  HPI:    Jose Lamb is a 62 y.o. male with a past medical history of advanced HIV disease nonadherent to ART who presented to [facility] emergency department last night with a primary complaint of left-sided weakness for approximately 6 weeks.  He underwent an MRI brain without contrast that showed abnormal T2 signal cortical subcortical and right greater than left insular postcentral gyrus as well as splenium and body of corpus callosum and thalamus and midbrain.  Neurology was consulted and while the findings could be nonspecific and reflect sequela of prior infarcts it was also concerning for some sort of infectious versus inflammatory process.  Included in this differential is PML.  Patient has known history of HIV.  He has not been seen for this in many years and has never been in care  here in [place].  His prior care had been at the [facility] ID clinic but nothing since 2018.  At that time he was noted to be out of care with no office visits since October 2017.  Bridge counselors had been on able to reach him for continuity of care.  His last HIV viral load 6 years ago was 60,094 copies and his CD4 count was 391 (28%).  At his last office visit his ART regimen consisted of Descovy, boosted darunavir with ritonavir,  and etravirine.  Neurology recommended that the patient be transferred to [facility] for a high-volume lumbar puncture.  He underwent LP uneventfully today and is currently awaiting transfer to [facility] from the ED at [facility]. He reports that he stopped taking ART several years ago due to the risk of cancer with Tenofovir.  He reports previously being diagnosed in 1993 and has been on ART in the past.  He also has bilateral pre-auricular/parotid masses with swelling that has been present for "quite a long time".     Past Medical History:  Diagnosis Date   Atrial fibrillation (HCC)    In the setting of hyperthyroidism   Bilateral pulmonary embolism [facility])    Diagnosed August 2021   Diabetes mellitus without complication (HCC)    Essential hypertension    HIV antibody positive (HCC)    Perforated duodenal ulcer (HCC) 2015   Contained without need for surgical intervention   Pneumonia due to COVID-19 virus    Diagnosed August 2021    Social History   Tobacco Use   Smoking status: Never   Smokeless tobacco: Never  Vaping Use   Vaping Use: Never used  Substance Use Topics   Alcohol use: No    Alcohol/week: 0.0 standard drinks of alcohol   Drug use: No    Family History  Problem Relation Age of Onset   Ulcers Mother    Heart Problems Mother    Colon cancer Neg Hx     Allergies  Allergen Reactions   Asa [Aspirin] Other (See Comments)    Nose bleeds    Review of Systems  All other systems reviewed and are negative.   OBJECTIVE:   Blood pressure 136/84, pulse 81, temperature 98.6 F (37 C), temperature source Oral, resp. rate 14, height 5\' 11"  (1.803 m), weight 104.3 kg, SpO2 97 %. Body mass index is 32.08 kg/m.  Physical Exam Constitutional:      Appearance: Normal appearance.  HENT:     Head: Normocephalic and atraumatic.     Comments: Bilateral pre-auricular/parotid masses felt without tenderness to palpation. Eyes:     Extraocular Movements: Extraocular movements intact.      Conjunctiva/sclera: Conjunctivae normal.  Cardiovascular:     Rate and Rhythm: Normal rate and regular rhythm.  Pulmonary:     Effort: Pulmonary effort is normal. No respiratory distress.  Abdominal:     General: There is no distension.     Palpations: Abdomen is soft.  Musculoskeletal:        General: Normal range of motion.     Cervical back: Normal range of motion and neck supple.  Skin:    General: Skin is warm and dry.  Neurological:     General: No focal deficit present.     Mental Status: He is alert and oriented to person, place, and time.  Psychiatric:        Mood and Affect: Mood normal.        Behavior: Behavior normal.  Lab Results: Lab Results  Component Value Date   WBC 3.9 (L) 07/08/2022   HGB 12.1 (L) 07/08/2022   HCT 37.7 (L) 07/08/2022   MCV 91.7 07/08/2022   PLT 108 (L) 07/08/2022    Lab Results  Component Value Date   NA 138 07/08/2022   K 3.2 (L) 07/08/2022   CO2 28 07/08/2022   GLUCOSE 104 (H) 07/08/2022   BUN 13 07/08/2022   CREATININE 0.79 07/08/2022   CALCIUM 8.6 (L) 07/08/2022   GFRNONAA >60 07/08/2022   GFRAA >60 09/19/2019    Lab Results  Component Value Date   ALT 24 07/07/2022   AST 21 07/07/2022   ALKPHOS 67 07/07/2022   BILITOT 0.6 07/07/2022       Component Value Date/Time   CRP 1.3 (H) 09/19/2019 0657       Component Value Date/Time   ESRSEDRATE 47 (H) 06/14/2022 1124    I have reviewed the micro and lab results in Epic.  Imaging: MR BRAIN WO CONTRAST  Result Date: 07/07/2022 CLINICAL DATA:  Transient ischemic attack EXAM: MRI HEAD WITHOUT CONTRAST TECHNIQUE: Multiplanar, multiecho pulse sequences of the brain and surrounding structures were obtained without intravenous contrast. COMPARISON:  None Available. FINDINGS: Brain: Increased T2 hyperintense signal along the right-greater-than-left insula (series 9, image 27), which is associated with mildly increased signal on diffusion-weighted imaging, with  decreased intensity on the ADC map. On the right, this is associated with a more focal area of T1 and T2 hyperintense signal, with surrounding edema (series 8, image 18, series 9, image 35 and series 12, image 111). T2 hyperintense signal is also noted in right thalamus, extending into the midbrain (series 4, images 24-25), although this could be related to wallerian degeneration. Similar abnormal cortical signal is noted in the right postcentral gyrus (series 9, image 46), which is associated with a similar T2 hyperintense, T1 hypointense area with peripheral edema in the right frontoparietal region (series 9, image 39). Less significant T2 hyperintense signal is noted in the left postcentral gyrus (series 9, image 36), without the more superior cortical involvement. Increased T2 signal in the splenium of the corpus callosum (series 9, image 31) and in the left body (series 9, image 35), which is not definitively associated with restricted diffusion. No acute hemorrhage, mass, mass effect, or midline shift. No hydrocephalus or extra-axial collection. Partial empty sella. Craniocervical junction. No hemosiderin deposition to suggest remote hemorrhage. Vascular: Normal arterial flow voids. Skull and upper cervical spine: Normal marrow signal. Sinuses/Orbits: Mucosal thickening left maxillary sinus. No acute finding in the orbits. Other: The mastoid air cells are well aerated. Bilateral parotid masses are better seen on the prior CT and are incompletely imaged on this exam IMPRESSION: 1. Abnormal T2 hyperintense signal, primarily cortical/subcortical, in the right greater than left insula and postcentral gyrus, as well as in the splenium and left body of the corpus callosum and possibly the right thalamus and midbrain. This is nonspecific, and while this could reflect sequela of prior infarcts, it is also concerning for an inflammatory or infectious process, such as PML, autoimmune encephalitis, or herpes simplex  encephalitis, although this is not particularly typical of any the entities. MRI with contrast could be helpful. Correlate with symptoms and consider CSF testing. 2. Bilateral parotid masses are better seen on the prior CT and are incompletely imaged on this exam. These results were called by telephone at the time of interpretation on 07/07/2022 at 10:30 pm to provider [facility] , who verbally acknowledged  these results. Electronically Signed   By: Wiliam Ke M.D.   On: 07/07/2022 22:39     Imaging independently reviewed in Epic.  Vedia Coffer for Infectious Disease [facility] [phone] pager 07/08/2022, 10:12 AM  I have personally spent 80 minutes involved in face-to-face and non-face-to-face activities for this patient on the day of the visit. Professional time spent includes the following activities: Preparing to see the patient (review of tests), Obtaining and/or reviewing separately obtained history (admission/discharge record), Performing a medically appropriate examination and/or evaluation , Ordering medications/tests/procedures, referring and communicating with other health care professionals, Documenting clinical information in the EMR, Independently interpreting results (not separately reported), Communicating results to the patient/family/caregiver, Counseling and educating the patient/family/caregiver and Care coordination (not separately reported).

## 2022-07-09 DIAGNOSIS — A812 Progressive multifocal leukoencephalopathy: Secondary | ICD-10-CM | POA: Diagnosis not present

## 2022-07-09 DIAGNOSIS — B20 Human immunodeficiency virus [HIV] disease: Secondary | ICD-10-CM | POA: Diagnosis not present

## 2022-07-09 DIAGNOSIS — R531 Weakness: Secondary | ICD-10-CM | POA: Diagnosis not present

## 2022-07-09 LAB — BASIC METABOLIC PANEL
Anion gap: 8 (ref 5–15)
BUN: 10 mg/dL (ref 6–20)
CO2: 24 mmol/L (ref 22–32)
Calcium: 8.3 mg/dL — ABNORMAL LOW (ref 8.9–10.3)
Chloride: 104 mmol/L (ref 98–111)
Creatinine, Ser: 0.77 mg/dL (ref 0.61–1.24)
GFR, Estimated: >60 mL/min (ref >60)
Glucose, Bld: 100 mg/dL — ABNORMAL HIGH (ref 70–99)
Potassium: 3.6 mmol/L (ref 3.5–5.1)
Sodium: 136 mmol/L (ref 135–145)

## 2022-07-09 LAB — RPR
RPR Ser Ql: REACTIVE — AB
RPR Titer: 1:1 {titer}

## 2022-07-09 LAB — HIV-1 RNA QUANT-NO REFLEX-BLD
HIV 1 RNA Quant: 320000 copies/mL
LOG10 HIV-1 RNA: 5.505 log10copy/mL

## 2022-07-09 LAB — IGG CSF INDEX

## 2022-07-09 LAB — GLUCOSE, CAPILLARY: Glucose-Capillary: 110 mg/dL — ABNORMAL HIGH (ref 70–99)

## 2022-07-09 LAB — CSF CULTURE W GRAM STAIN: Culture: NO GROWTH

## 2022-07-09 MED ORDER — ROSUVASTATIN CALCIUM 5 MG PO TABS
10.0000 mg | ORAL_TABLET | Freq: Every day | ORAL | Status: DC
  Administered 2022-07-09 – 2022-07-14 (×6): 10 mg via ORAL
  Filled 2022-07-09 (×6): qty 2

## 2022-07-09 MED ORDER — VITAMIN B-12 1000 MCG PO TABS
2000.0000 ug | ORAL_TABLET | Freq: Every day | ORAL | Status: DC
  Administered 2022-07-10 – 2022-07-14 (×5): 2000 ug via ORAL
  Filled 2022-07-09 (×5): qty 2

## 2022-07-09 NOTE — Evaluation (Signed)
Occupational Therapy Evaluation Patient Details Name: Jose Lamb MRN: [id number] DOB: 08-03-1960 Today's Date: 07/09/2022   History of Present Illness Jose Lamb is a 62 y.o. male who presents with 6 weeks of left-sided weakness and frequent falls. MRI brain revealed concern for inflammatory or infectious process. PMHx: CVA, paroxysmal atrial fibrillation not on oral anticoagulation, asthma, HIV with noncompliance, history of pulmonary embolism.   Clinical Impression   Jose Lamb was evaluated s/p the above admission list. He is indep at baseline (>6 weeks ago) but has needed assist with mobility and ADLs from his wife since he has developed L sided weakness, pt also endorses several falls. Upon evaluation he was limited by L hemibody weakness, coordination, proprioception, poor balance, decreased activity tolerance and limited insight. Overall he needed min A+2 to stand with RW given cues for hand placement and mod-max A +2 for gait with RW. Due to the deficits listed below he also needs up to max A +2 for LB ADLs and min A for UB ADLs with cues for [name] use/positioning. Pt will benefit from continued acute OT services and intensive inpatient follow up therapy, >3 hours/day after discharge.     Recommendations for follow up therapy are one component of a multi-disciplinary discharge planning process, led by the attending physician.  Recommendations may be updated based on patient status, additional functional criteria and insurance authorization.   Assistance Recommended at Discharge Frequent or constant Supervision/Assistance  Patient can return home with the following A lot of help with walking and/or transfers;Two people to help with walking and/or transfers;A lot of help with bathing/dressing/bathroom;Two people to help with bathing/dressing/bathroom;Assistance with cooking/housework;Assist for transportation;Help with stairs or ramp for entrance    Functional Status Assessment  Patient  has had a recent decline in their functional status and demonstrates the ability to make significant improvements in function in a reasonable and predictable amount of time.  Equipment Recommendations  Other (comment) (defer)    Recommendations for Other Services Rehab consult     Precautions / Restrictions Precautions Precautions: Fall Precaution Comments: Lt hemi Restrictions Weight Bearing Restrictions: No      Mobility Bed Mobility               General bed mobility comments: OOB upon arrival    Transfers Overall transfer level: Needs assistance Equipment used: Rolling walker (2 wheels) Transfers: Sit to/from Stand Sit to Stand: +2 safety/equipment, +2 physical assistance, Min assist           General transfer comment: more assist needed for ambulation with RW      Balance Overall balance assessment: Needs assistance Sitting-balance support: Feet supported Sitting balance-Leahy Scale: Fair Sitting balance - Comments: poor unsupported trunk control with posterior LOBs Postural control: Left lateral lean Standing balance support: During functional activity, Bilateral upper extremity supported, Reliant on assistive device for balance Standing balance-Leahy Scale: Poor Standing balance comment: reliant on RW and external support of therapist                           ADL either performed or assessed with clinical judgement   ADL Overall ADL's : Needs assistance/impaired Eating/Feeding: Set up;Sitting   Grooming: Set up;Sitting   Upper Body Bathing: Set up;Sitting   Lower Body Bathing: Moderate assistance;+2 for physical assistance;+2 for safety/equipment;Sit to/from stand   Upper Body Dressing : Min guard;Sitting   Lower Body Dressing: Maximal assistance;+2 for physical assistance;+2 for safety/equipment;Sit to/from stand   Toilet Transfer:  Stand-pivot;Minimal assistance;+2 for physical assistance;+2 for safety/equipment;Rolling walker (2  wheels);BSC/3in1 Toilet Transfer Details (indicate cue type and reason): more assist needed for ambulation to toilet Toileting- Clothing Manipulation and Hygiene: Minimal assistance;Sitting/lateral lean       Functional mobility during ADLs: Moderate assistance;Rolling walker (2 wheels);Cueing for safety;Maximal assistance General ADL Comments: limited by L hemiplegia and decrased activity tolerance     Vision Baseline Vision/History: 1 Wears glasses Vision Assessment?: No apparent visual deficits     Perception Perception Perception Tested?: No   Praxis Praxis Praxis tested?: Not tested    Pertinent Vitals/Pain Pain Assessment Pain Assessment: Faces Faces Pain Scale: Hurts even more Pain Location: Lt LE>UE Pain Descriptors / Indicators: Discomfort, Grimacing, Tingling Pain Intervention(s): Limited activity within patient's tolerance     Hand Dominance Right   Extremity/Trunk Assessment Upper Extremity Assessment Upper Extremity Assessment: [name] deficits/detail;RUE deficits/detail RUE Deficits / Details: overall WFL RUE Sensation: WNL RUE Coordination: WNL [name] Deficits / Details: full AROM, globally 3+/5 MMT. some shoulder pain with over head movement. impiared dexterity and proprioception. fatigues quickly. pins and needles sensation throughout [name] Sensation: decreased light touch;decreased proprioception [name] Coordination: decreased fine motor   Lower Extremity Assessment Lower Extremity Assessment: Defer to PT evaluation RLE Deficits / Details: WFL's 4/5 or better throughout Rt LE RLE Sensation: WNL RLE Coordination: WNL LLE Deficits / Details: 2-/5 or less for hip flex, knee flex/ext, DF/PF, pt reporting "pins & needles" sensation with light touch. pt unable to sense DF/PF with PROM from therapist. LLE Sensation: decreased proprioception;decreased light touch LLE Coordination: decreased fine motor;decreased gross motor   Cervical / Trunk Assessment Cervical /  Trunk Assessment: Normal   Communication Communication Communication: No difficulties   Cognition Arousal/Alertness: Awake/alert Behavior During Therapy: WFL for tasks assessed/performed Overall Cognitive Status: Within Functional Limits for tasks assessed                                       General Comments  VSS on RA    Exercises     Shoulder Instructions      Home Living Family/patient expects to be discharged to:: Private residence Living Arrangements: Spouse/significant other;Children;Other relatives Available Help at Discharge: Family Type of Home: House Home Access: Stairs to enter [employer] of Steps: 2 Entrance Stairs-Rails: None (nephew planning to build rails) Home Layout: Multi-level;Laundry or work area in basement;Full bath on main level;Able to live on main level with bedroom/bathroom     Bathroom Shower/Tub: Chief Strategy Officer: Standard Bathroom Accessibility: Yes   Home Equipment: Wheelchair - power;Cane - single point   Additional Comments: PTA and ~ last 6 weeks pt was fully independent and 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 power wc is from his wife when she was recovering from her cancer treatments. he has had to use it in the last ~6 weeks due to weakness.      Prior Functioning/Environment Prior Level of Function : Independent/Modified Independent;Needs assist;Driving;Working/employed;History of Falls (last six months)       Physical Assist : Mobility (physical);ADLs (physical) Mobility (physical): Transfers;Gait ADLs (physical): Bathing;Dressing;Toileting Mobility Comments: prior to ~6 weeks ago pt was independent with no device and working full time driving/operating large equipment. Over last 6 weeks pt has had worsening weakness in Lt side and decreased sensation, pt reports at least 8 falls. He has a SPC he has been using for some ambulation in  home and power WC. pt reports he has no awareness  /sense of Lt foot location and indicates he may have caught his foot under the chair. ADLs Comments: prior to ~6 weeks ago pt was independent with no AE required. Over last 6 weeks pt has required assits for transfers on/off toilet from spouse, is no longer showering due to fall in bathtub and is having assist from spouse for sink/sponge baths.        OT Problem List: Decreased strength;Decreased range of motion;Decreased activity tolerance;Impaired balance (sitting and/or standing);Decreased cognition;Decreased safety awareness;Decreased knowledge of precautions;Decreased knowledge of use of DME or AE;Impaired sensation;Impaired UE functional use      OT Treatment/Interventions: Self-care/ADL training;Therapeutic exercise;Neuromuscular education;DME and/or AE instruction;Therapeutic activities;Patient/family education;Balance training    OT Goals(Current goals can be found in the care plan section) Acute Rehab OT Goals Patient Stated Goal: to get better OT Goal Formulation: With patient Time For Goal Achievement: 07/23/22 Potential to Achieve Goals: Good ADL Goals Pt Will Perform Upper Body Dressing: with set-up;sitting Pt Will Perform Lower Body Dressing: with min assist;sit to/from stand Pt Will Transfer to Toilet: with min assist;ambulating Additional ADL Goal #1: Pt will use [name] as a functional assist for all ADLs without verbal cues  OT Frequency: Min 2X/week    Co-evaluation PT/OT/SLP Co-Evaluation/Treatment: Yes Reason for Co-Treatment: Complexity of the patient's impairments (multi-system involvement);For patient/therapist safety;To address functional/ADL transfers   OT goals addressed during session: ADL's and self-care      AM-PAC OT "6 Clicks" Daily Activity     Outcome Measure Help from another person eating meals?: A Little Help from another person taking care of personal grooming?: A Little Help from another person toileting, which includes using toliet, bedpan, or  urinal?: A Little Help from another person bathing (including washing, rinsing, drying)?: A Lot Help from another person to put on and taking off regular upper body clothing?: A Little Help from another person to put on and taking off regular lower body clothing?: A Lot 6 Click Score: 16   End of Session Equipment Utilized During Treatment: Gait belt;Rolling walker (2 wheels) Nurse Communication: Mobility status  Activity Tolerance: Patient tolerated treatment well Patient left: in chair;with call bell/phone within reach  OT Visit Diagnosis: Unsteadiness on feet (R26.81);Other abnormalities of gait and mobility (R26.89);Muscle weakness (generalized) (M62.81);History of falling (Z91.81);Repeated falls (R29.6);Hemiplegia and hemiparesis Hemiplegia - Right/Left: Left Hemiplegia - dominant/non-dominant: Non-Dominant                Time: 4098-1191 OT Time Calculation (min): 23 min Charges:  OT General Charges $OT Visit: 1 Visit OT Evaluation $OT Eval Moderate Complexity: 1 Mod  Derenda Mis, OTR/L Acute Rehabilitation Services Office [phone] Secure Chat Communication Preferred   Donia Pounds 07/09/2022, 11:31 AM

## 2022-07-09 NOTE — ED Provider Notes (Signed)
[place] EMERGENCY DEPARTMENT AT [facility] Provider Note   CSN: [id number] Arrival date & time: 07/05/22  0847     History  Chief Complaint  Patient presents with   Marletta Lor    Jose Lamb is a 62 y.o. male.  Patient recently had a stroke.  He presents with multiple falls.  Patient complains of soreness and weakness  The history is provided by the patient and medical records. No language interpreter was used.  Fall This is a new problem. The problem occurs rarely. The problem has been resolved. Pertinent negatives include no chest pain, no abdominal pain and no headaches. Nothing aggravates the symptoms. Nothing relieves the symptoms. He has tried nothing for the symptoms. The treatment provided no relief.       Home Medications Prior to Admission medications   Medication Sig Start Date End Date Taking? Authorizing Provider  albuterol (PROVENTIL) (2.5 MG/3ML) 0.083% nebulizer solution Take 2.5 mg by nebulization every 4 (four) hours. 12/01/21  Yes [provider]  albuterol (VENTOLIN HFA) 108 (90 Base) MCG/ACT inhaler 1 puff every 4 (four) hours as needed for wheezing or shortness of breath. 10/23/19  Yes [provider]  ascorbic acid (VITAMIN C) 500 MG tablet Take 1 tablet (500 mg total) by mouth daily. 04/10/22  Yes Vassie Loll, MD  cephALEXin (KEFLEX) 500 MG capsule Take 1 capsule (500 mg total) by mouth 4 (four) times daily. 07/05/22  Yes Bethann Berkshire, MD  oxyCODONE-acetaminophen (PERCOCET) 10-325 MG tablet Take 1 tablet by mouth every 6 (six) hours. 06/09/22  Yes [provider]      Allergies    Asa [aspirin]    Review of Systems   Review of Systems  Constitutional:  Negative for appetite change and fatigue.  HENT:  Negative for congestion, ear discharge and sinus pressure.   Eyes:  Negative for discharge.  Respiratory:  Negative for cough.   Cardiovascular:  Negative for chest pain.  Gastrointestinal:  Negative for abdominal  pain and diarrhea.  Genitourinary:  Negative for frequency and hematuria.  Musculoskeletal:  Negative for back pain.  Skin:  Negative for rash.  Neurological:  Negative for seizures and headaches.  Psychiatric/Behavioral:  Negative for hallucinations.     Physical Exam Updated Vital Signs BP 135/79   Pulse 80   Temp 98.9 F (37.2 C) (Oral)   Resp 17   Ht 5\' 11"  (1.803 m)   Wt 104.3 kg   SpO2 96%   BMI 32.08 kg/m  Physical Exam Vitals and nursing note reviewed.  Constitutional:      Appearance: He is well-developed.  HENT:     Head: Normocephalic.     Nose: Nose normal.  Eyes:     General: No scleral icterus.    Conjunctiva/sclera: Conjunctivae normal.  Neck:     Thyroid: No thyromegaly.  Cardiovascular:     Rate and Rhythm: Normal rate and regular rhythm.     Heart sounds: No murmur heard.    No friction rub. No gallop.  Pulmonary:     Breath sounds: No stridor. No wheezing or rales.  Chest:     Chest wall: No tenderness.  Abdominal:     General: There is no distension.     Tenderness: There is no abdominal tenderness. There is no rebound.  Musculoskeletal:        General: Normal range of motion.     Cervical back: Neck supple.     Comments: Weakness on left side from old  stroke  Lymphadenopathy:     Cervical: No cervical adenopathy.  Skin:    Findings: No erythema or rash.  Neurological:     Mental Status: He is alert and oriented to person, place, and time.     Motor: No abnormal muscle tone.     Coordination: Coordination normal.  Psychiatric:        Behavior: Behavior normal.     ED Results / Procedures / Treatments   Labs (all labs ordered are listed, but only abnormal results are displayed) Labs Reviewed  URINE CULTURE - Abnormal; Notable for the following components:      Result Value   Culture MULTIPLE SPECIES PRESENT, SUGGEST RECOLLECTION (*)    All other components within normal limits  COMPREHENSIVE METABOLIC PANEL - Abnormal; Notable for  the following components:   Potassium 3.2 (*)    Calcium 8.5 (*)    Albumin 3.3 (*)    All other components within normal limits  CBC WITH DIFFERENTIAL/PLATELET - Abnormal; Notable for the following components:   RBC 4.16 (*)    Hemoglobin 12.6 (*)    HCT 37.4 (*)    Platelets 115 (*)    All other components within normal limits  URINALYSIS, W/ REFLEX TO CULTURE (INFECTION SUSPECTED) - Abnormal; Notable for the following components:   APPearance HAZY (*)    Hgb urine dipstick LARGE (*)    Protein, ur 30 (*)    Bacteria, UA RARE (*)    All other components within normal limits  RESP PANEL BY RT-PCR (RSV, FLU A&B, COVID)  RVPGX2  CULTURE, BLOOD (ROUTINE X 2)  CULTURE, BLOOD (ROUTINE X 2)  LACTIC ACID, PLASMA  LACTIC ACID, PLASMA  PROTIME-INR  APTT    EKG EKG Interpretation  Date/Time:  Wednesday Jul 05 2022 08:56:45 EDT Ventricular Rate:  80 PR Interval:  125 QRS Duration: 83 QT Interval:  353 QTC Calculation: 408 R Axis:   -3 Text Interpretation: Sinus rhythm Multiple ventricular premature complexes Anteroseptal infarct, age indeterminate Confirmed by Bethann Berkshire [phone]) on 07/05/2022 12:08:14 PM  Radiology MR BRAIN W CONTRAST  Result Date: 07/08/2022 CLINICAL DATA:  Headache, no red flags EXAM: MRI HEAD WITH CONTRAST TECHNIQUE: Multiplanar, multiecho pulse sequences of the brain and surrounding structures were obtained with intravenous contrast. CONTRAST:  10mL GADAVIST GADOBUTROL 1 MMOL/ML IV SOLN COMPARISON:  MRI head 07/07/2022 FINDINGS: Axial T1 pre and postcontrast, coronal T1 postcontrast, and coronal T2 sequences obtained. No abnormal parenchymal or meningeal enhancement. Redemonstrated areas of T1 hyperintense signal in the right greater than left insula and frontoparietal region. IMPRESSION: No abnormal parenchymal or meningeal enhancement. Electronically Signed   By: Wiliam Ke M.D.   On: 07/08/2022 20:32   MR BRAIN WO CONTRAST  Result Date:  07/07/2022 CLINICAL DATA:  Transient ischemic attack EXAM: MRI HEAD WITHOUT CONTRAST TECHNIQUE: Multiplanar, multiecho pulse sequences of the brain and surrounding structures were obtained without intravenous contrast. COMPARISON:  None Available. FINDINGS: Brain: Increased T2 hyperintense signal along the right-greater-than-left insula (series 9, image 27), which is associated with mildly increased signal on diffusion-weighted imaging, with decreased intensity on the ADC map. On the right, this is associated with a more focal area of T1 and T2 hyperintense signal, with surrounding edema (series 8, image 18, series 9, image 35 and series 12, image 111). T2 hyperintense signal is also noted in right thalamus, extending into the midbrain (series 4, images 24-25), although this could be related to wallerian degeneration. Similar abnormal cortical signal is noted  in the right postcentral gyrus (series 9, image 46), which is associated with a similar T2 hyperintense, T1 hypointense area with peripheral edema in the right frontoparietal region (series 9, image 39). Less significant T2 hyperintense signal is noted in the left postcentral gyrus (series 9, image 36), without the more superior cortical involvement. Increased T2 signal in the splenium of the corpus callosum (series 9, image 31) and in the left body (series 9, image 35), which is not definitively associated with restricted diffusion. No acute hemorrhage, mass, mass effect, or midline shift. No hydrocephalus or extra-axial collection. Partial empty sella. Craniocervical junction. No hemosiderin deposition to suggest remote hemorrhage. Vascular: Normal arterial flow voids. Skull and upper cervical spine: Normal marrow signal. Sinuses/Orbits: Mucosal thickening left maxillary sinus. No acute finding in the orbits. Other: The mastoid air cells are well aerated. Bilateral parotid masses are better seen on the prior CT and are incompletely imaged on this exam  IMPRESSION: 1. Abnormal T2 hyperintense signal, primarily cortical/subcortical, in the right greater than left insula and postcentral gyrus, as well as in the splenium and left body of the corpus callosum and possibly the right thalamus and midbrain. This is nonspecific, and while this could reflect sequela of prior infarcts, it is also concerning for an inflammatory or infectious process, such as PML, autoimmune encephalitis, or herpes simplex encephalitis, although this is not particularly typical of any the entities. MRI with contrast could be helpful. Correlate with symptoms and consider CSF testing. 2. Bilateral parotid masses are better seen on the prior CT and are incompletely imaged on this exam. These results were called by telephone at the time of interpretation on 07/07/2022 at 10:30 pm to provider [facility] , who verbally acknowledged these results. Electronically Signed   By: Wiliam Ke M.D.   On: 07/07/2022 22:39    Procedures Procedures    Medications Ordered in ED Medications  acetaminophen (TYLENOL) tablet 650 mg (650 mg Oral Given 07/05/22 0948)  sodium chloride 0.9 % bolus 1,000 mL (0 mLs Intravenous Stopped 07/05/22 1229)  potassium chloride SA (KLOR-CON M) CR tablet 40 mEq (40 mEq Oral Given 07/05/22 1202)    ED Course/ Medical Decision Making/ A&P                             Medical Decision Making Amount and/or Complexity of Data Reviewed Labs: ordered. Radiology: ordered. ECG/medicine tests: ordered.  Risk OTC drugs. Prescription drug management.     This patient presents to the ED for concern of fall and weakness, this involves an extensive number of treatment options, and is a complaint that carries with it a high risk of complications and morbidity.  The differential diagnosis includes sepsis, stroke, UTI   Co morbidities that complicate the patient evaluation  Recent stroke   Additional history obtained:  Additional history obtained from  patient External records from outside source obtained and reviewed including hospital records   Lab Tests:  I Ordered, and personally interpreted labs.  The pertinent results include: Potassium 3.2, urinalysis shows greater than 50 red cells and 11-20 white cells with rare bacteria   Imaging Studies ordered:  I ordered imaging studies including chest x-ray I independently visualized and interpreted imaging which showed cardiomegaly I agree with the radiologist interpretation   Cardiac Monitoring: / EKG:  The patient was maintained on a cardiac monitor.  I personally viewed and interpreted the cardiac monitored which showed an underlying rhythm of: Normal sinus  rhythm   Consultations Obtained:  No consult Problem List / ED Course / Critical interventions / Medication management  Stroke, weakness, UTI, hypokalemia I ordered medication including antibiotics and potassium Reevaluation of the patient after these medicines showed that the patient improved I have reviewed the patients home medicines and have made adjustments as needed   Social Determinants of Health:  None   Test / Admission - Considered:  None   Labs unremarkable except for hypokalemia and possible urinary tract infection.  Patient will be placed on antibiotics and given potassium and will follow-up with PCP        Final Clinical Impression(s) / ED Diagnoses Final diagnoses:  Acute cystitis with hematuria    Rx / DC Orders ED Discharge Orders          Ordered    cephALEXin (KEFLEX) 500 MG capsule  4 times daily        07/05/22 1209              Bethann Berkshire, MD 07/09/22 1133

## 2022-07-09 NOTE — Progress Notes (Signed)
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 male admitted with:  Left-sided weakness and associated falls: Subacute symptoms of several weeks leading up to admission and MRI brain without contrast 07/07/2022 raising concern for possible PML versus other infectious/inflammatory process.  MRI brain with contrast for further evaluation 07/08/2022 without any abnormal parenchymal or meningeal enhancement.  Status post LP 6/1 notable for lymphocyte predominant pleocytosis (WBC 105), glucose 48, protein 70.  Meningitis/encephalitis panel detected HSV 2.  Multiple other tests from the CSF are also pending. HSV-2 positive CSF: Unclear how much this is contributing to his current symptoms of left-sided weakness for greater than 6 weeks as he has no other meningitis/encephalitis symptoms.  Currently on acyclovir. HIV disease: Patient with known and longstanding history of HIV and not on ART for several years.  Previously followed by ID at [facility].  Viral load and CD4 count pending from this admission.  Per review of prior ID notes at [facility], he was diagnosed in 87.  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  He has a history of PCP pneumonia in 1995.  Most recent regimen was Descovy, Darnunavir/ritonavir, and Etravirine. VL in 11/2015 was 60,094 copies, CD4 count 391.  Genotyping done that date as well, but unable to view in Care Everywhere.  Again, he has been out of care since 2017.  Prior to that, it appears that his HIV was under reasonable control until that time.  He continued to be viremic with last VL of 328,000 copies in Nov 2023 during an unrelated admission. 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  He has a CTA head and neck from 07/06/22 at [facility] ([facility]) noting these  masses and recommending ENT consult.  Patient states these have never been biopsied and/or cultured. Positive RPR: Titer 1: 1 with confirmatory treponemal antibody pending.  Previous RPR negative in 2017.  Unclear if received previous treatment or could be a false positive.  RECOMMENDATIONS:    Continue acyclovir given the positive HSV-2 PCR Follow-up further pending CSF studies, particularly JC virus PCR and cytology Would like to get started back on ART soon during this admission, however, he seems somewhat hesitant at this time.  Discussed the importance of doing so Discussed with ENT his findings of parotid gland masses.  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  Will have to discuss if he has received treatment in the past or could be a false positive pending confirmatory treponemal antibody.  Previous RPR was nonreactive in 2017 Attempt to add on CSF VDRL Dr. Daiva Eves and Drue Second are on starting tomorrow   Principal Problem:   Left-sided weakness Active Problems:   HIV disease (HCC)   Weakness    MEDICATIONS:    Scheduled Meds:  albuterol  2.5 mg Nebulization TID   heparin  5,000 Units Subcutaneous Q8H   rosuvastatin  10 mg Oral Daily   Continuous Infusions:  acyclovir 870 mg (07/09/22 0958)   lactated ringers Stopped (07/08/22 1002)   lactated ringers  125 mL/hr at 07/09/22 0505   PRN Meds:.acetaminophen, albuterol, HYDROmorphone (DILAUDID) injection, mouth rinse, oxyCODONE, polyethylene glycol, prochlorperazine  SUBJECTIVE:   24 hour events:  No acute events overnight Afebrile Tmax 99.2 MRI brain with contrast notes no abnormal parenchymal or meningeal enhancement LP results updated overnight with negative cryptococcal antigen.   Meningitis/encephalitis panel detected HSV 2 and he was started on acyclovir.  CSF cultures remain no growth.  JC virus PCR, cytology, other CSF labs pending  No new complaints.  No headaches.  No new weakness other than already known left-sided weakness.  Review of Systems  All other systems reviewed and are negative.     OBJECTIVE:   Blood pressure 119/80, pulse 84, temperature 98.9 F (37.2 C), temperature source Oral, resp. rate (!) 23, height 5\' 11"  (1.803 m), weight 104.3 kg, SpO2 93 %. Body mass index is 32.08 kg/m.  Physical Exam Constitutional:      General: He is not in acute distress. HENT:     Head: Normocephalic and atraumatic.     Comments: Bilateral parotid masses noted Eyes:     Extraocular Movements: Extraocular movements intact.     Conjunctiva/sclera: Conjunctivae normal.  Pulmonary:     Effort: Pulmonary effort is normal. No respiratory distress.  Abdominal:     General: There is no distension.     Palpations: Abdomen is soft.  Skin:    General: Skin is warm and dry.  Neurological:     General: No focal deficit present.     Mental Status: He is alert and oriented to person, place, and time.     Motor: Weakness present.     Comments: Left-sided weakness  Psychiatric:        Mood and Affect: Mood normal.        Behavior: Behavior normal.      Lab Results: Lab Results  Component Value Date   WBC 3.9 (L) 07/08/2022   HGB 12.1 (L) 07/08/2022   HCT 37.7 (L) 07/08/2022   MCV 91.7 07/08/2022   PLT 108 (L) 07/08/2022    Lab Results  Component Value Date   NA 136 07/09/2022   K 3.6 07/09/2022   CO2 24 07/09/2022   GLUCOSE 100 (H) 07/09/2022   BUN 10 07/09/2022   CREATININE 0.77 07/09/2022   CALCIUM 8.3 (L) 07/09/2022   GFRNONAA >60 07/09/2022   GFRAA >60 09/19/2019    Lab Results  Component Value Date   ALT 24 07/07/2022   AST 21 07/07/2022   ALKPHOS 67 07/07/2022   BILITOT 0.6 07/07/2022       Component Value Date/Time   CRP 1.3  (H) 09/19/2019 0657       Component Value Date/Time   ESRSEDRATE 47 (H) 06/14/2022 1124     I have reviewed the micro and lab results in Epic.  Imaging: MR BRAIN W CONTRAST  Result Date: 07/08/2022 CLINICAL DATA:  Headache, no red flags EXAM: MRI HEAD WITH CONTRAST TECHNIQUE: Multiplanar, multiecho pulse sequences of the brain and surrounding structures were obtained with intravenous contrast. CONTRAST:  10mL GADAVIST GADOBUTROL 1 MMOL/ML IV SOLN COMPARISON:  MRI head 07/07/2022 FINDINGS: Axial T1 pre and postcontrast, coronal T1 postcontrast, and coronal T2 sequences obtained. No abnormal parenchymal or meningeal enhancement. Redemonstrated areas of T1 hyperintense signal in the right greater than left insula and frontoparietal region. IMPRESSION: No abnormal parenchymal or meningeal enhancement. Electronically Signed   By: Wiliam Ke M.D.   On: 07/08/2022 20:32   MR BRAIN WO CONTRAST  Result Date: 07/07/2022 CLINICAL DATA:  Transient ischemic attack EXAM: MRI HEAD WITHOUT CONTRAST TECHNIQUE: Multiplanar, multiecho pulse sequences of the brain and surrounding structures were obtained without intravenous contrast. COMPARISON:  None Available. FINDINGS: Brain: Increased T2 hyperintense signal along the right-greater-than-left insula (series 9, image 27), which is associated with mildly increased signal on diffusion-weighted imaging, with decreased intensity on the ADC map. On the right, this is associated with a more focal area of T1 and T2 hyperintense signal, with surrounding edema (series 8, image 18, series 9, image 35 and series 12, image 111). T2 hyperintense signal is also noted in right thalamus, extending into the midbrain (series 4, images 24-25), although this could be related to wallerian degeneration. Similar abnormal cortical signal is noted in the right postcentral gyrus (series 9, image 46), which is associated with a similar T2 hyperintense, T1 hypointense area with peripheral  edema in the right frontoparietal region (series 9, image 39). Less significant T2 hyperintense signal is noted in the left postcentral gyrus (series 9, image 36), without the more superior cortical involvement. Increased T2 signal in the splenium of the corpus callosum (series 9, image 31) and in the left body (series 9, image 35), which is not definitively associated with restricted diffusion. No acute hemorrhage, mass, mass effect, or midline shift. No hydrocephalus or extra-axial collection. Partial empty sella. Craniocervical junction. No hemosiderin deposition to suggest remote hemorrhage. Vascular: Normal arterial flow voids. Skull and upper cervical spine: Normal marrow signal. Sinuses/Orbits: Mucosal thickening left maxillary sinus. No acute finding in the orbits. Other: The mastoid air cells are well aerated. Bilateral parotid masses are better seen on the prior CT and are incompletely imaged on this exam IMPRESSION: 1. Abnormal T2 hyperintense signal, primarily cortical/subcortical, in the right greater than left insula and postcentral gyrus, as well as in the splenium and left body of the corpus callosum and possibly the right thalamus and midbrain. This is nonspecific, and while this could reflect sequela of prior infarcts, it is also concerning for an inflammatory or infectious process, such as PML, autoimmune encephalitis, or herpes simplex encephalitis, although this is not particularly typical of any the entities. MRI with contrast could be helpful. Correlate with symptoms and consider CSF testing. 2. Bilateral parotid masses are better seen on the prior CT and are incompletely imaged on this exam. These results were called by telephone at the time of interpretation on 07/07/2022 at 10:30 pm to provider [facility] , who verbally acknowledged these results. Electronically Signed   By: Wiliam Ke M.D.   On: 07/07/2022 22:39     Imaging independently reviewed in Epic.    Vedia Coffer for Infectious Disease [facility] [phone] pager 07/09/2022, 10:01 AM  I have personally spent 50 minutes involved in face-to-face and non-face-to-face activities for this patient on the day of the visit. Professional time spent includes the following activities: Preparing to see the patient (review of tests), Obtaining and/or reviewing separately obtained history (admission/discharge record), Performing a medically appropriate examination and/or evaluation , Ordering medications/tests/procedures, referring and communicating with other health care professionals, Documenting clinical information in the EMR, Independently interpreting results (not separately reported), Communicating results to the patient/family/caregiver, Counseling and educating the patient/family/caregiver and Care coordination (not separately reported).

## 2022-07-09 NOTE — Evaluation (Signed)
Physical Therapy Evaluation Patient Details Name: Jose Lamb MRN: [id number] DOB: 1960/04/09 Today's Date: 07/09/2022  History of Present Illness  Jose Lamb is a 62 y.o. male who presents with 6 weeks of left-sided weakness and frequent falls. MRI brain revealed concern for inflammatory or infectious process. PMHx: CVA, paroxysmal atrial fibrillation not on oral anticoagulation, asthma, HIV with noncompliance, history of pulmonary embolism.   Clinical Impression  Jose Lamb is 62 y.o. male admitted with above HPI and diagnosis. Patient is currently limited by functional impairments below (see PT problem list). Patient lives with spouse and is independent at baseline. Pt is limited by sensory and motor deficits and required +2 min assist for sit<>stand and +2 Mod-Max assist for gait this date with RW. Pt has Lt postural lean and is able to correct intermittently with verbal cues however lean increases as pt fatigues. Patient will benefit from continued skilled PT interventions to address impairments and progress independence with mobility. Acute PT will follow and progress as able. Patient will benefit from intensive inpatient follow up therapy, >3 hours/day       Recommendations for follow up therapy are one component of a multi-disciplinary discharge planning process, led by the attending physician.  Recommendations may be updated based on patient status, additional functional criteria and insurance authorization.  Follow Up Recommendations       Assistance Recommended at Discharge Frequent or constant Supervision/Assistance  Patient can return home with the following  Two people to help with walking and/or transfers;A lot of help with bathing/dressing/bathroom;Assistance with cooking/housework;Assist for transportation;Help with stairs or ramp for entrance    Equipment Recommendations  (TBD at next venue)  Recommendations for Other Services  Rehab consult    Functional Status  Assessment Patient has had a recent decline in their functional status and demonstrates the ability to make significant improvements in function in a reasonable and predictable amount of time.     Precautions / Restrictions Precautions Precautions: Fall Precaution Comments: Lt hemi Restrictions Weight Bearing Restrictions: No      Mobility  Bed Mobility               General bed mobility comments: pt OOB in recliner    Transfers Overall transfer level: Needs assistance Equipment used: Rolling walker (2 wheels) Transfers: Sit to/from Stand Sit to Stand: +2 safety/equipment, +2 physical assistance, Min assist           General transfer comment: Mod cues required for preparation to stand including turnk posture for anterior trunk lean, and hand placement to power up. Min assist +2 for balance with rise. Slight Lt lean in standing, pt able to correct with verbal cues.    Ambulation/Gait Ambulation/Gait assistance: +2 physical assistance, +2 safety/equipment, Mod assist Gait Distance (Feet): 16 Feet Assistive device: Rolling walker (2 wheels) Gait Pattern/deviations: Step-through pattern, Decreased step length - right, Decreased step length - left, Decreased stance time - left, Decreased dorsiflexion - left, Decreased weight shift to right, Ataxic, Trunk flexed Gait velocity: decr     General Gait Details: Mod +2 for gait progressing towards Max +2 at end due to fatigue. Pt able to initiate Lt LE advancement with mod assist for foot placement and manual guarding to stabilize Lt knee in stance. Mod assist required to weight shift Rt for Lt step and stabilize balance with weight shift Lt for Rt step. Multiple standing rest breaks required to reset position of walker and cue pt for upright midline posture. Pt able to correct to midline  on first 2 rest breaks with greater difficulty as he fatigues. Towards end of gait pt required mod assist at Lt elbow to extend for upright posture  with gait as UE fatiguing. Chair follow for safety.  Stairs            Wheelchair Mobility    Modified Rankin (Stroke Patients Only)       Balance Overall balance assessment: Needs assistance Sitting-balance support: Feet supported Sitting balance-Leahy Scale: Good   Postural control: Left lateral lean (in standing) Standing balance support: During functional activity, Bilateral upper extremity supported, Reliant on assistive device for balance Standing balance-Leahy Scale: Poor Standing balance comment: reliant on RW and external support of therapist                             Pertinent Vitals/Pain Pain Assessment Pain Assessment: Faces Faces Pain Scale: Hurts even more Pain Location: Lt LE>UE Pain Descriptors / Indicators: Discomfort, Grimacing, Tingling (pins & needles to touch) Pain Intervention(s): Monitored during session, Limited activity within patient's tolerance, Repositioned    Home Living Family/patient expects to be discharged to:: Private residence Living Arrangements: Spouse/significant other;Children;Other relatives Available Help at Discharge: Family Type of Home: House Home Access: Stairs to enter Entrance Stairs-Rails: None Entrance Stairs-Number of Steps: 2   Home Layout: Multi-level;Laundry or work area in basement;Full bath on main level;Able to live on main level with bedroom/bathroom Home Equipment: Wheelchair - power;Cane - single point Additional Comments: PTA and ~ last 6 weeks pt was fully independent and 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 power wc is from his wife when she was recovering from her cancer treatments. he has had to use it in the last ~6 weeks due to weakness.    Prior Function Prior Level of Function : Independent/Modified Independent;Needs assist;Driving;Working/employed;History of Falls (last six months)       Physical Assist : Mobility (physical);ADLs (physical) Mobility (physical):  Transfers;Gait ADLs (physical): Bathing;Dressing;Toileting Mobility Comments: prior to ~6 weeks ago pt was independent with no device and working full time driving/operating large equipment. Over last 6 weeks pt has had worsening weakness in Lt side and decreased sensation, pt reports at least 8 falls. He has a SPC he has been using for some ambulation in home and power WC. pt reports he has no awareness /sense of Lt foot location and indicates he may have caught his foot under the chair. ADLs Comments: prior to ~6 weeks ago pt was independent with no AE required. Over last 6 weeks pt has required assits for transfers on/off toilet from spouse, is no longer showering due to fall in bathtub and is having assist from spouse for sink/sponge baths.     Hand Dominance   Dominant Hand: Right    Extremity/Trunk Assessment   Upper Extremity Assessment Upper Extremity Assessment: Defer to OT evaluation    Lower Extremity Assessment Lower Extremity Assessment: RLE deficits/detail;LLE deficits/detail RLE Deficits / Details: WFL's 4/5 or better throughout Rt LE RLE Sensation: WNL RLE Coordination: WNL LLE Deficits / Details: 2-/5 or less for hip flex, knee flex/ext, DF/PF, pt reporting "pins & needles" sensation with light touch. pt unable to sense DF/PF with PROM from therapist. LLE Sensation: decreased proprioception;decreased light touch LLE Coordination: decreased fine motor;decreased gross motor    Cervical / Trunk Assessment Cervical / Trunk Assessment: Normal  Communication   Communication: No difficulties  Cognition Arousal/Alertness: Awake/alert Behavior During Therapy: WFL for tasks assessed/performed Overall Cognitive  Status: Within Functional Limits for tasks assessed                                          General Comments      Exercises     Assessment/Plan    PT Assessment Patient needs continued PT services  PT Problem List Decreased  strength;Decreased range of motion;Decreased activity tolerance;Decreased balance;Decreased mobility;Decreased coordination;Decreased cognition;Decreased knowledge of use of DME;Decreased safety awareness;Impaired sensation;Obesity;Decreased knowledge of precautions       PT Treatment Interventions DME instruction;Patient/family education;Cognitive remediation;Neuromuscular re-education;Balance training;Therapeutic exercise;Therapeutic activities;Functional mobility training;Stair training;Gait training;Wheelchair mobility training    PT Goals (Current goals can be found in the Care Plan section)  Acute Rehab PT Goals Patient Stated Goal: regain function and independence PT Goal Formulation: With patient Time For Goal Achievement: 07/23/22 Potential to Achieve Goals: Good    Frequency Min 4X/week     Co-evaluation               AM-PAC PT "6 Clicks" Mobility  Outcome Measure Help needed turning from your back to your side while in a flat bed without using bedrails?: A Little Help needed moving from lying on your back to sitting on the side of a flat bed without using bedrails?: A Little Help needed moving to and from a bed to a chair (including a wheelchair)?: Total Help needed standing up from a chair using your arms (e.g., wheelchair or bedside chair)?: Total Help needed to walk in hospital room?: Total Help needed climbing 3-5 steps with a railing? : Total 6 Click Score: 10    End of Session Equipment Utilized During Treatment: Gait belt Activity Tolerance: Patient tolerated treatment well Patient left: in chair;with call bell/phone within reach Nurse Communication: Mobility status PT Visit Diagnosis: Unsteadiness on feet (R26.81);Other abnormalities of gait and mobility (R26.89);Ataxic gait (R26.0);Muscle weakness (generalized) (M62.81);Hemiplegia and hemiparesis;Other symptoms and signs involving the nervous system (R29.898);Difficulty in walking, not elsewhere classified  (R26.2) Hemiplegia - Right/Left: Left Hemiplegia - dominant/non-dominant: Non-dominant Hemiplegia - caused by: Unspecified    Time: 1610-9604 PT Time Calculation (min) (ACUTE ONLY): 25 min   Charges:   PT Evaluation $PT Eval Moderate Complexity: 1 Mod          Wynn Maudlin, DPT Acute Rehabilitation Services Office [phone]  07/09/22 11:28 AM

## 2022-07-09 NOTE — Progress Notes (Signed)
PROGRESS NOTE                                                                                                                                                                                                             Patient Demographics:    Jose Lamb, is a 62 y.o. male, DOB - 18-Nov-1960, ZOX:[id number]  Outpatient Primary MD for the patient is Ladon Applebaum    LOS - 2  Admit date - 07/07/2022    Chief Complaint  Patient presents with   Weakness       Brief Narrative (HPI from H&P)    62 y.o. male with medical history significant for prior CVA, paroxysmal atrial fibrillation not on oral anticoagulation, asthma, HIV with noncompliance, history of pulmonary embolism, who presents to [facility] ED with complaints of 6 weeks of left-sided weakness involving his left arm and left leg.  Associated with frequent falls.  Has not been taking his ART medications.  MRI of the brain showing right-sided T2 hypertense signal on the MRI primarily cortical/subcortical, in the right greater than left insula and postcentral gyrus, neurology and ID were consulted and he was admitted to the hospital for further care.   Subjective:    Jose Lamb today has, No headache, No chest pain, No abdominal pain - No Nausea, No new weakness tingling or numbness, no SOB, ++ L Leg weakness   Assessment  & Plan :    Left-sided weakness leg significantly more weak than the arm.  MRI of the brain suggestive of T2 hypertense signal primarily cortical/subcortical, in the right greater than left insula and postcentral gyrus, neurology and ID on board, he is noncompliant with his HIV medications.  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  Continue supportive care with PT OT.   HIV.  Noncompliant with medications, counseled on compliance, ID on board defer management to ID.  He is now  agreeable to taking his HIV medications as prescribed.  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  Per ID requested ENT input.  May require biopsy and cultures of the mass.  Dyslipidemia.  Placed on statin.  Asthma.  Stable no acute issues.  Obesity.  BMI 32.  Follow-up with PCP for weight loss.  Condition -   Guarded  Family Communication  :  None  Code Status :  Full  Consults  :  ID, Neuro, ENT  PUD Prophylaxis :    Procedures  :     MRI - 1. Abnormal T2 hyperintense signal, primarily cortical/subcortical, in the right greater than left insula and postcentral gyrus, as well as in the splenium and left body of the corpus callosum and possibly the right thalamus and midbrain. This is nonspecific, and while this could reflect sequela of prior infarcts, it is also concerning for an inflammatory or infectious process, such as PML, autoimmune encephalitis, or herpes simplex encephalitis, although this is not particularly typical of any the entities. MRI with contrast could be helpful. Correlate with symptoms and consider CSF testing. 2. Bilateral parotid masses are better seen on the prior CT and are incompletely imaged on this exam.      Disposition Plan  :      DVT Prophylaxis  :    heparin injection 5,000 Units Start: 07/08/22 1400    Lab Results  Component Value Date   PLT 108 (L) 07/08/2022    Diet :  Diet Order             Diet regular Room service appropriate? Yes; Fluid consistency: Thin  Diet effective now                    Inpatient Medications  Scheduled Meds:  albuterol  2.5 mg Nebulization TID   heparin  5,000 Units Subcutaneous Q8H   Continuous Infusions:  acyclovir Stopped (07/08/22 2334)   lactated ringers Stopped (07/08/22 1002)   lactated ringers 125 mL/hr at 07/09/22 0505   PRN Meds:.acetaminophen, albuterol, HYDROmorphone (DILAUDID) injection, mouth rinse, oxyCODONE, polyethylene glycol,  prochlorperazine  Antibiotics  :    Anti-infectives (From admission, onward)    Start     Dose/Rate Route Frequency Ordered Stop   07/08/22 2315  acyclovir (ZOVIRAX) 1,000 mg in dextrose 5 % 250 mL IVPB  Status:  Discontinued        1,000 mg 270 mL/hr over 60 Minutes Intravenous Every 8 hours 07/08/22 2221 07/08/22 2229   07/08/22 2315  acyclovir (ZOVIRAX) 870 mg in dextrose 5 % 250 mL IVPB        10 mg/kg  86.9 kg (Adjusted) 267.4 mL/hr over 60 Minutes Intravenous Every 8 hours 07/08/22 2229           Objective:   Vitals:   07/08/22 2000 07/09/22 0000 07/09/22 0400 07/09/22 0853  BP: (!) 110/91 106/72 133/86 119/80  Pulse: 78 79 84   Resp: 20 (!) 21 (!) 23   Temp: 98.9 F (37.2 C) 98.6 F (37 C) 98.9 F (37.2 C)   TempSrc: Oral Oral Oral Oral  SpO2: 95% 94% 93%   Weight:      Height:        Wt Readings from Last 3 Encounters:  07/07/22 104.3 kg  07/05/22 104.3 kg  04/08/22 106.7 kg     Intake/Output Summary (Last 24 hours) at 07/09/2022 0938 Last data filed at 07/09/2022 0505 Gross per 24 hour  Intake 817.51 ml  Output 100 ml  Net 717.51 ml     Physical Exam  Awake Alert, No new F.N deficits, L leg 2/5, L arm mildly weak [place].AT,PERRAL Supple Neck, No JVD,   Symmetrical Chest wall movement, Good air movement bilaterally, CTAB RRR,No Gallops,Rubs or new Murmurs,  +ve B.Sounds, Abd Soft, No tenderness,  No Cyanosis, Clubbing or edema        Data Review:    Recent Labs  Lab 07/05/22 0919 07/07/22 1819 07/07/22 1841 07/08/22 0500  WBC 6.0 4.1  --  3.9*  HGB 12.6* 13.1 12.9* 12.1*  HCT 37.4* 39.9 38.0* 37.7*  PLT 115* 112*  --  108*  MCV 89.9 91.5  --  91.7  MCH 30.3 30.0  --  29.4  MCHC 33.7 32.8  --  32.1  RDW 14.2 14.4  --  14.4  LYMPHSABS 1.2 1.0  --   --   MONOABS 0.7 0.6  --   --   EOSABS 0.2 0.2  --   --   BASOSABS 0.0 0.0  --   --     Recent Labs  Lab 07/05/22 0919 07/05/22 0946 07/05/22 1141 07/07/22 1819 07/07/22 1841  07/08/22 0500 07/09/22 0323  NA 136  --   --  135 140 138 136  K 3.2*  --   --  3.4* 3.6 3.2* 3.6  CL 102  --   --  102 101 105 104  CO2 25  --   --  25  --  28 24  ANIONGAP 9  --   --  8  --  5 8  GLUCOSE 97  --   --  95 89 104* 100*  BUN 11  --   --  14 12 13 10   CREATININE 0.76  --   --  0.78 0.70 0.79 0.77  AST 16  --   --  21  --   --   --   ALT 19  --   --  24  --   --   --   ALKPHOS 66  --   --  67  --   --   --   BILITOT 0.9  --   --  0.6  --   --   --   ALBUMIN 3.3*  --   --  3.6  --   --   --   LATICACIDVEN  --  0.7 0.7  --   --   --   --   INR 1.1  --   --  1.0  --   --   --   MG  --   --   --   --   --  2.3  --   CALCIUM 8.5*  --   --  8.4*  --  8.6* 8.3*      Recent Labs  Lab 07/05/22 0919 07/05/22 0946 07/05/22 1141 07/07/22 1819 07/08/22 0500 07/09/22 0323  LATICACIDVEN  --  0.7 0.7  --   --   --   INR 1.1  --   --  1.0  --   --   MG  --   --   --   --  2.3  --   CALCIUM 8.5*  --   --  8.4* 8.6* 8.3*    Recent Labs  Lab 07/05/22 0919 07/05/22 0946 07/05/22 1141 07/07/22 1819 07/07/22 1841 07/08/22 0500 07/09/22 0323  WBC 6.0  --   --  4.1  --  3.9*  --   PLT 115*  --   --  112*  --  108*  --   LATICACIDVEN  --  0.7 0.7  --   --   --   --   CREATININE 0.76  --   --  0.78 0.70 0.79 0.77    ------------------------------------------------------------------------------------------------------------------  Lab Results  Component Value Date   CHOL 197 04/10/2022   HDL 39 (L) 04/10/2022   LDLCALC 126 (H) 04/10/2022   TRIG 158 (H) 04/10/2022   CHOLHDL 5.1 04/10/2022    Lab Results  Component Value Date   HGBA1C 6.6 (H) 04/10/2022    No results for input(s): "TSH", "T4TOTAL", "T3FREE", "THYROIDAB" in the last 72 hours.  Invalid input(s): "FREET3" ------------------------------------------------------------------------------------------------------------------ Cardiac Enzymes No results for input(s): "CKMB", "TROPONINI", "MYOGLOBIN" in the  last 168 hours.  Invalid input(s): "CK"  Micro Results Recent Results (from the past 240 hour(s))  Urine Culture     Status: Abnormal   Collection Time: 07/05/22  9:19 AM   Specimen: Urine, Clean Catch  Result Value Ref Range Status   Specimen Description   Final    URINE, CLEAN CATCH Performed at [facility], [address] [place], [place] [address]    Special Requests   Final    NONE Reflexed from [id number] Performed at [facility], [address], [place], [place] [address]    Culture MULTIPLE SPECIES PRESENT, SUGGEST RECOLLECTION (A)  Final   Report Status 07/06/2022 FINAL  Final  Blood Culture (routine x 2)     Status: None (Preliminary result)   Collection Time: 07/05/22  9:46 AM   Specimen: Left Antecubital; Blood  Result Value Ref Range Status   Specimen Description LEFT ANTECUBITAL  Final   Special Requests   Final    BOTTLES DRAWN AEROBIC AND ANAEROBIC Blood Culture results may not be optimal due to an excessive volume of blood received in culture bottles   Culture   Final    NO GROWTH 3 DAYS Performed at [facility], [address] [place], [place] [address]    Report Status PENDING  Incomplete  Blood Culture (routine x 2)     Status: None (Preliminary result)   Collection Time: 07/05/22  9:46 AM   Specimen: BLOOD RIGHT ARM  Result Value Ref Range Status   Specimen Description BLOOD RIGHT ARM  Final   Special Requests   Final    BOTTLES DRAWN AEROBIC AND ANAEROBIC Blood Culture results may not be optimal due to an excessive volume of blood received in culture bottles   Culture   Final    NO GROWTH 3 DAYS Performed at [facility], [address], [place], [place] [address]    Report Status PENDING  Incomplete  Resp panel by RT-PCR (RSV, Flu A&B, Covid) Anterior Nasal Swab     Status: None   Collection Time: 07/05/22  9:47 AM   Specimen: Anterior Nasal Swab  Result Value Ref Range Status   SARS Coronavirus 2 by RT PCR NEGATIVE NEGATIVE Final     Comment: (NOTE) SARS-CoV-2 target nucleic acids are NOT DETECTED.  The SARS-CoV-2 RNA is generally detectable in upper respiratory specimens during the acute phase of infection. The lowest concentration of SARS-CoV-2 viral copies this assay can detect is 138 copies/mL. A negative result does not preclude SARS-Cov-2 infection and should not be used as the sole basis for treatment or other patient management decisions. A negative result may occur with  improper specimen collection/handling, submission of specimen other than nasopharyngeal swab, presence of viral mutation(s) within the areas targeted by this assay, and inadequate number of viral copies(<138 copies/mL). A negative result must be combined with clinical observations, patient history, and epidemiological information. The expected result is Negative.  Fact Sheet for Patients:  BloggerCourse.com  Fact Sheet for Healthcare Providers:  SeriousBroker.it  This test is no t yet approved or cleared by the [place] and  has been authorized for detection and/or diagnosis of SARS-CoV-2 by FDA under an Emergency Use Authorization (EUA). This EUA will remain  in effect (meaning this test can be used) for the duration of the COVID-19 declaration under Section 564(b)(1) of the Act, 21 U.S.C.section 360bbb-3(b)(1), unless the authorization is terminated  or revoked sooner.       Influenza A by PCR NEGATIVE NEGATIVE Final   Influenza B by PCR NEGATIVE NEGATIVE Final    Comment: (NOTE) The Xpert Xpress SARS-CoV-2/FLU/RSV plus assay is intended as an aid in the diagnosis of influenza from Nasopharyngeal swab specimens and should not be used as a sole basis for treatment. Nasal washings and aspirates are unacceptable for Xpert Xpress SARS-CoV-2/FLU/RSV testing.  Fact Sheet for Patients: BloggerCourse.com  Fact Sheet for Healthcare  Providers: SeriousBroker.it  This test is not yet approved or cleared by the [place] FDA and has been authorized for detection and/or diagnosis of SARS-CoV-2 by FDA under an Emergency Use Authorization (EUA). This EUA will remain in effect (meaning this test can be used) for the duration of the COVID-19 declaration under Section 564(b)(1) of the Act, 21 U.S.C. section 360bbb-3(b)(1), unless the authorization is terminated or revoked.     Resp Syncytial Virus by PCR NEGATIVE NEGATIVE Final    Comment: (NOTE) Fact Sheet for Patients: BloggerCourse.com  Fact Sheet for Healthcare Providers: SeriousBroker.it  This test is not yet approved or cleared by the [place] FDA and has been authorized for detection and/or diagnosis of SARS-CoV-2 by FDA under an Emergency Use Authorization (EUA). This EUA will remain in effect (meaning this test can be used) for the duration of the COVID-19 declaration under Section 564(b)(1) of the Act, 21 U.S.C. section 360bbb-3(b)(1), unless the authorization is terminated or revoked.  Performed at [facility], [address], [place], [place] [address]   Urine Culture     Status: Abnormal   Collection Time: 07/07/22  7:21 PM   Specimen: Urine, Clean Catch  Result Value Ref Range Status   Specimen Description   Final    URINE, CLEAN CATCH Performed at [facility], [address], [place], [place] [address]    Special Requests   Final    NONE Performed at [facility], [address], [place], [place] [address]    Culture (A)  Final    <10,000 COLONIES/mL INSIGNIFICANT GROWTH Performed at [facility], [address], [place], [place] [address]    Report Status 07/08/2022 FINAL  Final  CSF culture w Gram Stain     Status: None (Preliminary result)   Collection Time: 07/08/22 12:30 PM   Specimen: CSF; Cerebrospinal Fluid   Result Value Ref Range Status   Specimen Description CSF  Final   Special Requests NONE  Final   Gram Stain   Final    WBC PRESENT,BOTH PMN AND MONONUCLEAR NO ORGANISMS SEEN CYTOSPIN Gram Stain Report Called to,Read Back By and Verified With: Derl Barrow RN @1425  ON 6.1.2024 BY [facility] Performed at [facility], [address] [place], [place] [address]    Culture PENDING  Incomplete   Report Status PENDING  Incomplete    Radiology Reports MR BRAIN W CONTRAST  Result Date: 07/08/2022 CLINICAL DATA:  Headache, no red flags EXAM: MRI HEAD WITH CONTRAST TECHNIQUE: Multiplanar, multiecho pulse sequences of the brain and surrounding structures were obtained  with intravenous contrast. CONTRAST:  10mL GADAVIST GADOBUTROL 1 MMOL/ML IV SOLN COMPARISON:  MRI head 07/07/2022 FINDINGS: Axial T1 pre and postcontrast, coronal T1 postcontrast, and coronal T2 sequences obtained. No abnormal parenchymal or meningeal enhancement. Redemonstrated areas of T1 hyperintense signal in the right greater than left insula and frontoparietal region. IMPRESSION: No abnormal parenchymal or meningeal enhancement. Electronically Signed   By: Wiliam Ke M.D.   On: 07/08/2022 20:32   MR BRAIN WO CONTRAST  Result Date: 07/07/2022 CLINICAL DATA:  Transient ischemic attack EXAM: MRI HEAD WITHOUT CONTRAST TECHNIQUE: Multiplanar, multiecho pulse sequences of the brain and surrounding structures were obtained without intravenous contrast. COMPARISON:  None Available. FINDINGS: Brain: Increased T2 hyperintense signal along the right-greater-than-left insula (series 9, image 27), which is associated with mildly increased signal on diffusion-weighted imaging, with decreased intensity on the ADC map. On the right, this is associated with a more focal area of T1 and T2 hyperintense signal, with surrounding edema (series 8, image 18, series 9, image 35 and series 12, image 111). T2 hyperintense signal is also noted in  right thalamus, extending into the midbrain (series 4, images 24-25), although this could be related to wallerian degeneration. Similar abnormal cortical signal is noted in the right postcentral gyrus (series 9, image 46), which is associated with a similar T2 hyperintense, T1 hypointense area with peripheral edema in the right frontoparietal region (series 9, image 39). Less significant T2 hyperintense signal is noted in the left postcentral gyrus (series 9, image 36), without the more superior cortical involvement. Increased T2 signal in the splenium of the corpus callosum (series 9, image 31) and in the left body (series 9, image 35), which is not definitively associated with restricted diffusion. No acute hemorrhage, mass, mass effect, or midline shift. No hydrocephalus or extra-axial collection. Partial empty sella. Craniocervical junction. No hemosiderin deposition to suggest remote hemorrhage. Vascular: Normal arterial flow voids. Skull and upper cervical spine: Normal marrow signal. Sinuses/Orbits: Mucosal thickening left maxillary sinus. No acute finding in the orbits. Other: The mastoid air cells are well aerated. Bilateral parotid masses are better seen on the prior CT and are incompletely imaged on this exam IMPRESSION: 1. Abnormal T2 hyperintense signal, primarily cortical/subcortical, in the right greater than left insula and postcentral gyrus, as well as in the splenium and left body of the corpus callosum and possibly the right thalamus and midbrain. This is nonspecific, and while this could reflect sequela of prior infarcts, it is also concerning for an inflammatory or infectious process, such as PML, autoimmune encephalitis, or herpes simplex encephalitis, although this is not particularly typical of any the entities. MRI with contrast could be helpful. Correlate with symptoms and consider CSF testing. 2. Bilateral parotid masses are better seen on the prior CT and are incompletely imaged on this  exam. These results were called by telephone at the time of interpretation on 07/07/2022 at 10:30 pm to provider [facility] , who verbally acknowledged these results. Electronically Signed   By: Wiliam Ke M.D.   On: 07/07/2022 22:39   DG Chest Port 1 View  Result Date: 07/05/2022 CLINICAL DATA:  Questionable sepsis - evaluate for abnormality EXAM: PORTABLE CHEST 1 VIEW COMPARISON:  April 05, 2022. FINDINGS: Similar enlarged cardiac silhouette. Pulmonary vascular congestion. No overt pulmonary edema. No consolidation. No visible pleural effusions or pneumothorax. Polyarticular degenerative change. IMPRESSION: Similar cardiomegaly and pulmonary vascular congestion without overt pulmonary edema. Electronically Signed   By: Feliberto Harts M.D.   On: 07/05/2022 09:54  Signature  -   Susa Raring M.D on 07/09/2022 at 9:38 AM   -  To page go to [email or web address]

## 2022-07-09 NOTE — Progress Notes (Signed)
Subjective: Resting comfortably in bed with no complaints.   Objective: Current vital signs: BP 133/86 (BP Location: Left Arm)   Pulse 84   Temp 98.9 F (37.2 C) (Oral)   Resp (!) 23   Ht 5\' 11"  (1.803 m)   Wt 104.3 kg   SpO2 93%   BMI 32.08 kg/m  Vital signs in last 24 hours: Temp:  [98.6 F (37 C)-99.2 F (37.3 C)] 98.9 F (37.2 C) (06/02 0400) Pulse Rate:  [72-94] 84 (06/02 0400) Resp:  [14-31] 23 (06/02 0400) BP: (106-158)/(65-121) 133/86 (06/02 0400) SpO2:  [92 %-100 %] 93 % (06/02 0400)  Intake/Output from previous day: 06/01 0701 - 06/02 0700 In: 817.5 [I.V.:816.7; IV Piggyback:0.8] Out: 100 [Urine:100] Intake/Output this shift: No intake/output data recorded. Nutritional status:  Diet Order             Diet regular Room service appropriate? Yes; Fluid consistency: Thin  Diet effective now                  GENERAL: Awake, alert in NAD  HEENT: - Normocephalic and atraumatic LUNGS - Respirations unlabored Ext: warm, well perfused   NEURO:  Mental Status: Alert and oriented x 5. Speech is clear and fluent with intact comprehension.  Cranial Nerves: PERRL EOMI, visual fields full, no facial asymmetry, facial sensation intact, hearing intact, no lingual dysarthria Motor: [name] 4/5 and decreased left grip rated as 3/5; LLE 1-2/5 RUE 5/5, RLE 5/5 Tone is decreased on the left. Bulk is normal Sensation- Decreased on left side, worse on left leg Coordination: Moderate ataxia with FNF on the left. No ataxia on the right. Unable to perform H-S on the left Gait- Unable to assess  Lab Results: Results for orders placed or performed during the hospital encounter of 07/07/22 (from the past 48 hour(s))  Ethanol     Status: None   Collection Time: 07/07/22  6:19 PM  Result Value Ref Range   Alcohol, Ethyl (B) <10 <10 mg/dL    Comment: (NOTE) Lowest detectable limit for serum alcohol is 10 mg/dL.  For medical purposes only. Performed at [facility], [address], [place], [place] [address]   Protime-INR     Status: None   Collection Time: 07/07/22  6:19 PM  Result Value Ref Range   Prothrombin Time 13.7 11.4 - 15.2 seconds   INR 1.0 0.8 - 1.2    Comment: (NOTE) INR goal varies based on device and disease states. Performed at [facility], [address] [place], [place] [address]   APTT     Status: None   Collection Time: 07/07/22  6:19 PM  Result Value Ref Range   aPTT 34 24 - 36 seconds    Comment: Performed at [facility], [address], [place], [place] [address]  CBC     Status: Abnormal   Collection Time: 07/07/22  6:19 PM  Result Value Ref Range   WBC 4.1 4.0 - 10.5 K/uL   RBC 4.36 4.22 - 5.81 MIL/uL   Hemoglobin 13.1 13.0 - 17.0 g/dL   HCT 21.3 08.6 - 57.8 %   MCV 91.5 80.0 - 100.0 fL   MCH 30.0 26.0 - 34.0 pg   MCHC 32.8 30.0 - 36.0 g/dL   RDW 46.9 62.9 - 52.8 %   Platelets 112 (L) 150 - 400 K/uL    Comment: SPECIMEN CHECKED FOR CLOTS REPEATED TO VERIFY    nRBC 0.0 0.0 -  0.2 %    Comment: Performed at [facility], [address] [place], [place] [address]  Differential     Status: None   Collection Time: 07/07/22  6:19 PM  Result Value Ref Range   Neutrophils Relative % 56 %   Neutro Abs 2.3 1.7 - 7.7 K/uL   Lymphocytes Relative 23 %   Lymphs Abs 1.0 0.7 - 4.0 K/uL   Monocytes Relative 14 %   Monocytes Absolute 0.6 0.1 - 1.0 K/uL   Eosinophils Relative 6 %   Eosinophils Absolute 0.2 0.0 - 0.5 K/uL   Basophils Relative 1 %   Basophils Absolute 0.0 0.0 - 0.1 K/uL   Immature Granulocytes 0 %   Abs Immature Granulocytes 0.01 0.00 - 0.07 K/uL    Comment: Performed at [facility], [address], [place], [place] [address]  Comprehensive metabolic panel     Status: Abnormal   Collection Time: 07/07/22  6:19 PM  Result Value Ref Range   Sodium 135 135 - 145 mmol/L   Potassium 3.4 (L) 3.5 - 5.1 mmol/L   Chloride  102 98 - 111 mmol/L   CO2 25 22 - 32 mmol/L   Glucose, Bld 95 70 - 99 mg/dL    Comment: Glucose reference range applies only to samples taken after fasting for at least 8 hours.   BUN 14 6 - 20 mg/dL   Creatinine, Ser 0.98 0.61 - 1.24 mg/dL   Calcium 8.4 (L) 8.9 - 10.3 mg/dL   Total Protein 7.7 6.5 - 8.1 g/dL   Albumin 3.6 3.5 - 5.0 g/dL   AST 21 15 - 41 U/L   ALT 24 0 - 44 U/L   Alkaline Phosphatase 67 38 - 126 U/L   Total Bilirubin 0.6 0.3 - 1.2 mg/dL   GFR, Estimated >11 >91 mL/min    Comment: (NOTE) Calculated using the CKD-EPI Creatinine Equation (2021)    Anion gap 8 5 - 15    Comment: Performed at [facility], [address], [place], [place] [address]  Dickie [place] 8, ED     Status: Abnormal   Collection Time: 07/07/22  6:41 PM  Result Value Ref Range   Sodium 140 135 - 145 mmol/L   Potassium 3.6 3.5 - 5.1 mmol/L   Chloride 101 98 - 111 mmol/L   BUN 12 6 - 20 mg/dL   Creatinine, Ser 5.62 0.61 - 1.24 mg/dL   Glucose, Bld 89 70 - 99 mg/dL    Comment: Glucose reference range applies only to samples taken after fasting for at least 8 hours.   Calcium, Ion 1.15 1.15 - 1.40 mmol/L   TCO2 28 22 - 32 mmol/L   Hemoglobin 12.9 (L) 13.0 - 17.0 g/dL   HCT 13.0 (L) 86.5 - 78.4 %  Urine rapid drug screen (hosp performed)     Status: None   Collection Time: 07/07/22  7:20 PM  Result Value Ref Range   Opiates NONE DETECTED NONE DETECTED   Cocaine NONE DETECTED NONE DETECTED   Benzodiazepines NONE DETECTED NONE DETECTED   Amphetamines NONE DETECTED NONE DETECTED   Tetrahydrocannabinol NONE DETECTED NONE DETECTED   Barbiturates NONE DETECTED NONE DETECTED    Comment: (NOTE) DRUG SCREEN FOR MEDICAL PURPOSES ONLY.  IF CONFIRMATION IS NEEDED FOR ANY PURPOSE, NOTIFY LAB WITHIN 5 DAYS.  LOWEST DETECTABLE LIMITS FOR URINE DRUG SCREEN Drug Class  Cutoff (ng/mL) Amphetamine and metabolites    1000 Barbiturate and metabolites     200 Benzodiazepine                 200 Opiates and metabolites        300 Cocaine and metabolites        300 THC                            50 Performed at [facility], [address], [place], [place] [address]   Urinalysis, Routine w reflex microscopic -Urine, Clean Catch     Status: Abnormal   Collection Time: 07/07/22  7:20 PM  Result Value Ref Range   Color, Urine YELLOW YELLOW   APPearance CLEAR CLEAR   Specific Gravity, Urine 1.023 1.005 - 1.030   pH 6.0 5.0 - 8.0   Glucose, UA NEGATIVE NEGATIVE mg/dL   Hgb urine dipstick NEGATIVE NEGATIVE   Bilirubin Urine NEGATIVE NEGATIVE   Ketones, ur NEGATIVE NEGATIVE mg/dL   Protein, ur 191 (A) NEGATIVE mg/dL   Nitrite NEGATIVE NEGATIVE   Leukocytes,Ua NEGATIVE NEGATIVE   RBC / HPF 0-5 0 - 5 RBC/hpf   WBC, UA 0-5 0 - 5 WBC/hpf   Bacteria, UA NONE SEEN NONE SEEN   Squamous Epithelial / HPF 0-5 0 - 5 /HPF   Mucus PRESENT    Hyaline Casts, UA PRESENT     Comment: Performed at [facility], [address] [place], [place] [address]  Urine Culture     Status: Abnormal   Collection Time: 07/07/22  7:21 PM   Specimen: Urine, Clean Catch  Result Value Ref Range   Specimen Description      URINE, CLEAN CATCH Performed at [facility], [address], [place], [place] [address]    Special Requests      NONE Performed at [facility], [address] [place], [place] [address]    Culture (A)     <10,000 COLONIES/mL INSIGNIFICANT GROWTH Performed at [facility], [address], [place], [place] [address]    Report Status 07/08/2022 FINAL   CBC     Status: Abnormal   Collection Time: 07/08/22  5:00 AM  Result Value Ref Range   WBC 3.9 (L) 4.0 - 10.5 K/uL   RBC 4.11 (L) 4.22 - 5.81 MIL/uL   Hemoglobin 12.1 (L) 13.0 - 17.0 g/dL   HCT 29.5 (L) 28.4 - 13.2 %   MCV 91.7 80.0 - 100.0 fL   MCH 29.4 26.0 - 34.0 pg   MCHC 32.1 30.0 - 36.0 g/dL   RDW  44.0 10.2 - 72.5 %   Platelets 108 (L) 150 - 400 K/uL   nRBC 0.0 0.0 - 0.2 %    Comment: Performed at [facility], [address] [place], [place] [address]  Basic metabolic panel     Status: Abnormal   Collection Time: 07/08/22  5:00 AM  Result Value Ref Range   Sodium 138 135 - 145 mmol/L   Potassium 3.2 (L) 3.5 - 5.1 mmol/L   Chloride 105 98 - 111 mmol/L   CO2 28 22 - 32 mmol/L   Glucose, Bld 104 (H) 70 - 99 mg/dL    Comment: Glucose reference range applies only to samples taken after fasting for at least 8 hours.   BUN 13 6 - 20 mg/dL   Creatinine, Ser 0.34  0.61 - 1.24 mg/dL   Calcium 8.6 (L) 8.9 - 10.3 mg/dL   GFR, Estimated >16 >10 mL/min    Comment: (NOTE) Calculated using the CKD-EPI Creatinine Equation (2021)    Anion gap 5 5 - 15    Comment: Performed at [facility], [address], [place], [place] [address]  Magnesium     Status: None   Collection Time: 07/08/22  5:00 AM  Result Value Ref Range   Magnesium 2.3 1.7 - 2.4 mg/dL    Comment: Performed at [facility], [address], [place], [place] [address]  Phosphorus     Status: None   Collection Time: 07/08/22  5:00 AM  Result Value Ref Range   Phosphorus 4.0 2.5 - 4.6 mg/dL    Comment: Performed at [facility], [address] [place], [place] [address]  CSF cell count with differential collection tube #: 1     Status: Abnormal   Collection Time: 07/08/22 12:30 PM  Result Value Ref Range   Tube # 1    Color, CSF COLORLESS COLORLESS   Appearance, CSF CLEAR (A) CLEAR   Supernatant NOT INDICATED    RBC Count, CSF 11 (H) 0 /cu mm   WBC, CSF 108 (HH) 0 - 5 /cu mm    Comment: CRITICAL RESULT CALLED TO, READ BACK BY AND VERIFIED WITH: SAVOIE, B. RN @1426  ON 6.1.2024 BY NMCCOY     Lymphs, CSF 89 (H) 40 - 80 %   Monocyte-Macrophage-Spinal Fluid 10 (L) 15 - 45 %   Eosinophils, CSF 1 0 - 1 %    Comment: Performed at [facility], [address], [place], [place] [address]  CSF cell count with differential collection tube #: 4     Status: Abnormal   Collection Time: 07/08/22 12:30 PM  Result Value Ref Range   Tube # 4    Color, CSF COLORLESS COLORLESS   Appearance, CSF CLEAR (A) CLEAR   Supernatant NOT INDICATED    RBC Count, CSF 2 (H) 0 /cu mm   WBC, CSF 105 (HH) 0 - 5 /cu mm    Comment: CRITICAL RESULT CALLED TO, READ BACK BY AND VERIFIED WITH: SAVOIE, B. RN @1425  ON 6.1.2024 BY NMCCOY    Segmented Neutrophils-CSF 2 0 - 6 %   Lymphs, CSF 86 (H) 40 - 80 %   Monocyte-Macrophage-Spinal Fluid 11 (L) 15 - 45 %   Eosinophils, CSF 1 0 - 1 %    Comment: Performed at [facility], [address] [place], [place] [address]  Protein and glucose, CSF     Status: Abnormal   Collection Time: 07/08/22 12:30 PM  Result Value Ref Range   Glucose, CSF 48 40 - 70 mg/dL   Total  Protein, CSF 70 (H) 15 - 45 mg/dL    Comment: Performed at [facility], [address] [place], [place] [address]  CSF culture w Gram Stain     Status: None (Preliminary result)   Collection Time: 07/08/22 12:30 PM   Specimen: CSF; Cerebrospinal Fluid  Result Value Ref Range   Specimen Description CSF    Special Requests NONE    Gram Stain      WBC PRESENT,BOTH PMN AND MONONUCLEAR NO ORGANISMS SEEN CYTOSPIN Gram Stain Report Called to,Read Back By and Verified With: Derl Barrow RN @1425  ON 6.1.2024 BY [facility] Performed at [facility], [address] [place], [place]  [address]    Culture PENDING    Report Status PENDING   Cryptococcal antigen, CSF     Status: Abnormal   Collection Time: 07/08/22 12:30 PM  Result Value Ref Range   Crypto Ag NEGATIVE NEGATIVE   Cryptococcal Ag Titer NOT APPLICABLE (A) NOT INDICATED    Comment: Performed at [facility], [address], [place], [place] [address]  Meningitis/Encephalitis Panel (CSF)     Status: Abnormal   Collection  Time: 07/08/22 12:30 PM  Result Value Ref Range   Cryptococcus neoformans/gattii (CSF) NOT DETECTED NOT DETECTED    Comment: (NOTE) Patients with a suspicion of cryptococcal meningitis should be tested  for cryptococcal antigen (CrAg).      Cytomegalovirus (CSF) NOT DETECTED NOT DETECTED   Enterovirus (CSF) NOT DETECTED NOT DETECTED   Escherichia coli K1 (CSF) NOT DETECTED NOT DETECTED    Comment: (NOTE) Only E. coli strains possessing the K1 capsular antigen will be detected.      Haemophilus influenzae (CSF) NOT DETECTED NOT DETECTED   Herpes simplex virus 1 (CSF) NOT DETECTED NOT DETECTED   Herpes simplex virus 2 (CSF) DETECTED (A) NOT DETECTED    Comment: CRITICAL RESULT CALLED TO, READ BACK BY AND VERIFIED WITH: W. TAYLOR RN 07/08/22 @ 2149 BY AB (NOTE) False detection may occur due to reactivation of latent virus from previous infection and should only be considered within appropriate clinical context.      Human herpesvirus 6 (CSF) NOT DETECTED NOT DETECTED   Human parechovirus (CSF) NOT DETECTED NOT DETECTED   Listeria monocytogenes (CSF) NOT DETECTED NOT DETECTED   Neisseria meningitis (CSF) NOT DETECTED NOT DETECTED    Comment: (NOTE) Only encapsulated strains of N. meningitidis will be detected.     Streptococcus agalactiae (CSF) NOT DETECTED NOT DETECTED   Streptococcus pneumoniae (CSF) NOT DETECTED NOT DETECTED   Varicella zoster virus (CSF) NOT DETECTED NOT DETECTED    Comment: Performed at [facility], [address], [place], [place] [address]  Basic metabolic panel     Status: Abnormal   Collection Time: 07/09/22  3:23 AM  Result Value Ref Range   Sodium 136 135 - 145 mmol/L   Potassium 3.6 3.5 - 5.1 mmol/L   Chloride 104 98 - 111 mmol/L   CO2 24 22 - 32 mmol/L   Glucose, Bld 100 (H) 70 - 99 mg/dL    Comment: Glucose reference range applies only to samples taken after fasting for at least 8 hours.   BUN 10 6 - 20 mg/dL   Creatinine, Ser 9.14 0.61  - 1.24 mg/dL   Calcium 8.3 (L) 8.9 - 10.3 mg/dL   GFR, Estimated >78 >29 mL/min    Comment: (NOTE) Calculated using the CKD-EPI Creatinine Equation (2021)    Anion gap 8 5 - 15    Comment: Performed at [facility], [address] [place], [place] [address]    Recent Results (from the past 240 hour(s))  Urine Culture     Status: Abnormal   Collection Time: 07/05/22  9:19 AM   Specimen: Urine, Clean Catch  Result Value Ref Range Status   Specimen Description   Final    URINE, CLEAN CATCH Performed at [facility], [address], [place], [place] [address]    Special Requests   Final    NONE Reflexed from [id number] Performed at [facility], [address], [place], [place] [address]    Culture MULTIPLE SPECIES PRESENT,  SUGGEST RECOLLECTION (A)  Final   Report Status 07/06/2022 FINAL  Final  Blood Culture (routine x 2)     Status: None (Preliminary result)   Collection Time: 07/05/22  9:46 AM   Specimen: Left Antecubital; Blood  Result Value Ref Range Status   Specimen Description LEFT ANTECUBITAL  Final   Special Requests   Final    BOTTLES DRAWN AEROBIC AND ANAEROBIC Blood Culture results may not be optimal due to an excessive volume of blood received in culture bottles   Culture   Final    NO GROWTH 3 DAYS Performed at [facility], [address] [place], [place] [address]    Report Status PENDING  Incomplete  Blood Culture (routine x 2)     Status: None (Preliminary result)   Collection Time: 07/05/22  9:46 AM   Specimen: BLOOD RIGHT ARM  Result Value Ref Range Status   Specimen Description BLOOD RIGHT ARM  Final   Special Requests   Final    BOTTLES DRAWN AEROBIC AND ANAEROBIC Blood Culture results may not be optimal due to an excessive volume of blood received in culture bottles   Culture   Final    NO GROWTH 3 DAYS Performed at [facility], [address], [place], [place] [address]    Report Status PENDING  Incomplete  Resp panel by RT-PCR (RSV, Flu  A&B, Covid) Anterior Nasal Swab     Status: None   Collection Time: 07/05/22  9:47 AM   Specimen: Anterior Nasal Swab  Result Value Ref Range Status   SARS Coronavirus 2 by RT PCR NEGATIVE NEGATIVE Final    Comment: (NOTE) SARS-CoV-2 target nucleic acids are NOT DETECTED.  The SARS-CoV-2 RNA is generally detectable in upper respiratory specimens during the acute phase of infection. The lowest concentration of SARS-CoV-2 viral copies this assay can detect is 138 copies/mL. A negative result does not preclude SARS-Cov-2 infection and should not be used as the sole basis for treatment or other patient management decisions. A negative result may occur with  improper specimen collection/handling, submission of specimen other than nasopharyngeal swab, presence of viral mutation(s) within the areas targeted by this assay, and inadequate number of viral copies(<138 copies/mL). A negative result must be combined with clinical observations, patient history, and epidemiological information. The expected result is Negative.  Fact Sheet for Patients:  BloggerCourse.com  Fact Sheet for Healthcare Providers:  SeriousBroker.it  This test is no t yet approved or cleared by the [place] FDA and  has been authorized for detection and/or diagnosis of SARS-CoV-2 by FDA under an Emergency Use Authorization (EUA). This EUA will remain  in effect (meaning this test can be used) for the duration of the COVID-19 declaration under Section 564(b)(1) of the Act, 21 U.S.C.section 360bbb-3(b)(1), unless the authorization is terminated  or revoked sooner.       Influenza A by PCR NEGATIVE NEGATIVE Final   Influenza B by PCR NEGATIVE NEGATIVE Final    Comment: (NOTE) The Xpert Xpress SARS-CoV-2/FLU/RSV plus assay is intended as an aid in the diagnosis of influenza from Nasopharyngeal swab specimens and should not be used as a sole basis for treatment.  Nasal washings and aspirates are unacceptable for Xpert Xpress SARS-CoV-2/FLU/RSV testing.  Fact Sheet for Patients: BloggerCourse.com  Fact Sheet for Healthcare Providers: SeriousBroker.it  This test is not yet approved or cleared by the [place] FDA and has been authorized for detection and/or diagnosis of SARS-CoV-2 by FDA under an Emergency Use Authorization (  EUA). This EUA will remain in effect (meaning this test can be used) for the duration of the COVID-19 declaration under Section 564(b)(1) of the Act, 21 U.S.C. section 360bbb-3(b)(1), unless the authorization is terminated or revoked.     Resp Syncytial Virus by PCR NEGATIVE NEGATIVE Final    Comment: (NOTE) Fact Sheet for Patients: BloggerCourse.com  Fact Sheet for Healthcare Providers: SeriousBroker.it  This test is not yet approved or cleared by the [place] FDA and has been authorized for detection and/or diagnosis of SARS-CoV-2 by FDA under an Emergency Use Authorization (EUA). This EUA will remain in effect (meaning this test can be used) for the duration of the COVID-19 declaration under Section 564(b)(1) of the Act, 21 U.S.C. section 360bbb-3(b)(1), unless the authorization is terminated or revoked.  Performed at [facility], [address], [place], [place] [address]   Urine Culture     Status: Abnormal   Collection Time: 07/07/22  7:21 PM   Specimen: Urine, Clean Catch  Result Value Ref Range Status   Specimen Description   Final    URINE, CLEAN CATCH Performed at [facility], [address], [place], [place] [address]    Special Requests   Final    NONE Performed at [facility], [address], [place], [place] [address]    Culture (A)  Final    <10,000 COLONIES/mL INSIGNIFICANT GROWTH Performed at [facility], [address] [place],  [place] [address]    Report Status 07/08/2022 FINAL  Final  CSF culture w Gram Stain     Status: None (Preliminary result)   Collection Time: 07/08/22 12:30 PM   Specimen: CSF; Cerebrospinal Fluid  Result Value Ref Range Status   Specimen Description CSF  Final   Special Requests NONE  Final   Gram Stain   Final    WBC PRESENT,BOTH PMN AND MONONUCLEAR NO ORGANISMS SEEN CYTOSPIN Gram Stain Report Called to,Read Back By and Verified With: Derl Barrow RN @1425  ON 6.1.2024 BY [facility] Performed at [facility], [address], [place], [place] [address]    Culture PENDING  Incomplete   Report Status PENDING  Incomplete    Lipid Panel No results for input(s): "CHOL", "TRIG", "HDL", "CHOLHDL", "VLDL", "LDLCALC" in the last 72 hours.  Studies/Results: MR BRAIN W CONTRAST  Result Date: 07/08/2022 CLINICAL DATA:  Headache, no red flags EXAM: MRI HEAD WITH CONTRAST TECHNIQUE: Multiplanar, multiecho pulse sequences of the brain and surrounding structures were obtained with intravenous contrast. CONTRAST:  10mL GADAVIST GADOBUTROL 1 MMOL/ML IV SOLN COMPARISON:  MRI head 07/07/2022 FINDINGS: Axial T1 pre and postcontrast, coronal T1 postcontrast, and coronal T2 sequences obtained. No abnormal parenchymal or meningeal enhancement. Redemonstrated areas of T1 hyperintense signal in the right greater than left insula and frontoparietal region. IMPRESSION: No abnormal parenchymal or meningeal enhancement. Electronically Signed   By: Wiliam Ke M.D.   On: 07/08/2022 20:32   MR BRAIN WO CONTRAST  Result Date: 07/07/2022 CLINICAL DATA:  Transient ischemic attack EXAM: MRI HEAD WITHOUT CONTRAST TECHNIQUE: Multiplanar, multiecho pulse sequences of the brain and surrounding structures were obtained without intravenous contrast. COMPARISON:  None Available. FINDINGS: Brain: Increased T2 hyperintense signal along the right-greater-than-left insula (series 9, image 27), which is associated with mildly  increased signal on diffusion-weighted imaging, with decreased intensity on the ADC map. On the right, this is associated with a more focal area of T1 and T2 hyperintense signal, with surrounding edema (series 8, image  18, series 9, image 35 and series 12, image 111). T2 hyperintense signal is also noted in right thalamus, extending into the midbrain (series 4, images 24-25), although this could be related to wallerian degeneration. Similar abnormal cortical signal is noted in the right postcentral gyrus (series 9, image 46), which is associated with a similar T2 hyperintense, T1 hypointense area with peripheral edema in the right frontoparietal region (series 9, image 39). Less significant T2 hyperintense signal is noted in the left postcentral gyrus (series 9, image 36), without the more superior cortical involvement. Increased T2 signal in the splenium of the corpus callosum (series 9, image 31) and in the left body (series 9, image 35), which is not definitively associated with restricted diffusion. No acute hemorrhage, mass, mass effect, or midline shift. No hydrocephalus or extra-axial collection. Partial empty sella. Craniocervical junction. No hemosiderin deposition to suggest remote hemorrhage. Vascular: Normal arterial flow voids. Skull and upper cervical spine: Normal marrow signal. Sinuses/Orbits: Mucosal thickening left maxillary sinus. No acute finding in the orbits. Other: The mastoid air cells are well aerated. Bilateral parotid masses are better seen on the prior CT and are incompletely imaged on this exam IMPRESSION: 1. Abnormal T2 hyperintense signal, primarily cortical/subcortical, in the right greater than left insula and postcentral gyrus, as well as in the splenium and left body of the corpus callosum and possibly the right thalamus and midbrain. This is nonspecific, and while this could reflect sequela of prior infarcts, it is also concerning for an inflammatory or infectious process, such as  PML, autoimmune encephalitis, or herpes simplex encephalitis, although this is not particularly typical of any the entities. MRI with contrast could be helpful. Correlate with symptoms and consider CSF testing. 2. Bilateral parotid masses are better seen on the prior CT and are incompletely imaged on this exam. These results were called by telephone at the time of interpretation on 07/07/2022 at 10:30 pm to provider [facility] , who verbally acknowledged these results. Electronically Signed   By: Wiliam Ke M.D.   On: 07/07/2022 22:39    Medications: Scheduled:  albuterol  2.5 mg Nebulization TID   heparin  5,000 Units Subcutaneous Q8H   Continuous:  acyclovir Stopped (07/08/22 2334)   lactated ringers Stopped (07/08/22 1002)   lactated ringers 125 mL/hr at 07/09/22 0505    Assessment: 62 y.o. male with past medical history of  A fib not on AC, PE, DM, HTN, HIV noncompliant with medications who presents to [facility] ED for left side weakness and frequent falls that has been ongoing and getting progressively worse over the last 6 weeks. 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  - On exam he is alert and oriented, [name] 4/5, LLE 2/5, not able to lift off bed but can move horizontally, Right hemibody 5/5, mild ataxia in left arm and decreased sensation on left hemibody worse on left leg  - Per ID note, they are also concerned about possible PML and if this diagnosis is confirmed, the only treatment will be ART. ID recommends holding off on starting ART until more CSF information is back such as cryptococcal antigen. They recommend ENT consult to see about biopsy for pathology of the bilateral parotid masses.  - Imaging: - MRI brain without contrast: Abnormal T2 hyperintense signal, primarily cortical/subcortical, in the right greater than left insula and postcentral gyrus, as well as in the splenium and left body of the corpus callosum and possibly the right thalamus and midbrain, concerning for an inflammatory or  infectious  process, such as PML, autoimmune encephalitis, or herpes simplex encephalitis. Subtle associated abnormal DWI signal in an irregular rim surrounding the lesions. - MRI brain with contrast (add on study): No enhancement of the lesions or other abnormal enhancement seen. This supports the DDx of PML as PML lesions usually do not enhance. The lack of enhancement significantly lowers the likelihood of a fungal or bacterial etiology.  - Prior labs: Vitamin B12 low at 261 - Serum labs: - WBC low at 3.9, platelets low at 108 - CD4 count abnormally low at < 35 - Cryptococal antigen negative - Quantitative HIV RNA from serum sample is 320,000. 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 Caryl Pina 07/09/2022  7:21 AM

## 2022-07-10 ENCOUNTER — Inpatient Hospital Stay (HOSPITAL_COMMUNITY): Payer: BC Managed Care – PPO

## 2022-07-10 DIAGNOSIS — A812 Progressive multifocal leukoencephalopathy: Secondary | ICD-10-CM

## 2022-07-10 DIAGNOSIS — K111 Hypertrophy of salivary gland: Secondary | ICD-10-CM

## 2022-07-10 DIAGNOSIS — B20 Human immunodeficiency virus [HIV] disease: Secondary | ICD-10-CM | POA: Diagnosis not present

## 2022-07-10 DIAGNOSIS — K118 Other diseases of salivary glands: Secondary | ICD-10-CM | POA: Diagnosis not present

## 2022-07-10 DIAGNOSIS — B003 Herpesviral meningitis: Secondary | ICD-10-CM | POA: Diagnosis not present

## 2022-07-10 DIAGNOSIS — R531 Weakness: Secondary | ICD-10-CM | POA: Diagnosis not present

## 2022-07-10 LAB — T-HELPER CELLS (CD4) COUNT (NOT AT ARMC)

## 2022-07-10 LAB — T.PALLIDUM AB, TOTAL: T Pallidum Abs: NONREACTIVE

## 2022-07-10 LAB — BASIC METABOLIC PANEL
Anion gap: 6 (ref 5–15)
BUN: 7 mg/dL (ref 6–20)
CO2: 26 mmol/L (ref 22–32)
Calcium: 8.6 mg/dL — ABNORMAL LOW (ref 8.9–10.3)
Chloride: 102 mmol/L (ref 98–111)
Creatinine, Ser: 0.75 mg/dL (ref 0.61–1.24)
GFR, Estimated: >60 mL/min (ref >60)
Glucose, Bld: 99 mg/dL (ref 70–99)
Potassium: 3.8 mmol/L (ref 3.5–5.1)
Sodium: 134 mmol/L — ABNORMAL LOW (ref 135–145)

## 2022-07-10 LAB — PROTIME-INR
INR: 1.1 (ref 0.8–1.2)
Prothrombin Time: 14.4 seconds (ref 11.4–15.2)

## 2022-07-10 LAB — VDRL, CSF: VDRL Quant, CSF: NONREACTIVE

## 2022-07-10 LAB — CSF CULTURE W GRAM STAIN

## 2022-07-10 MED ORDER — LACTATED RINGERS IV SOLN: INTRAVENOUS | Status: DC

## 2022-07-10 MED ORDER — SULFAMETHOXAZOLE-TRIMETHOPRIM 800-160 MG PO TABS
1.0000 | ORAL_TABLET | Freq: Every day | ORAL | Status: DC
  Administered 2022-07-10 – 2022-07-14 (×5): 1 via ORAL
  Filled 2022-07-10 (×5): qty 1

## 2022-07-10 MED ORDER — BICTEGRAVIR-EMTRICITAB-TENOFOV 50-200-25 MG PO TABS
1.0000 | ORAL_TABLET | Freq: Every day | ORAL | Status: DC
  Administered 2022-07-10 – 2022-07-14 (×5): 1 via ORAL
  Filled 2022-07-10 (×5): qty 1

## 2022-07-10 MED ORDER — LIDOCAINE-EPINEPHRINE 1 %-1:100000 IJ SOLN
10.0000 mL | Freq: Once | INTRAMUSCULAR | Status: AC
  Administered 2022-07-10: 10 mL via INTRADERMAL
  Filled 2022-07-10: qty 10

## 2022-07-10 NOTE — Progress Notes (Signed)
Physical Therapy Treatment Patient Details Name: Jose Lamb MRN: [id number] DOB: 12-04-1960 Today's Date: 07/10/2022   History of Present Illness Jose Lamb is a 62 y.o. male who presents with 6 weeks of left-sided weakness and frequent falls. MRI brain revealed concern for inflammatory or infectious process. PMHx: CVA, paroxysmal atrial fibrillation not on oral anticoagulation, asthma, HIV with noncompliance, history of pulmonary embolism.    PT Comments    Pt greeted up on [facility] with RN present and agreeable to session. Pt requiring increased assist this session due to pt stated fatigue and increased pain in LLE. Pt requiring mod-max A +2 to transfer to standing and pivot BSC>EOB with RW for support. Pt unable to shift weight to L with L knee blocked and multimodal cues step RLE and extend [name] to complete step pivot transfer or progress gait, max A +2 needed to maintain standing balance throughout. Pt continues to be limited by L hemi-weakness, impaired balance/postural reactions and sensory/motor deficits. Current plan remains appropriate to address deficits and maximize functional independence and decrease caregiver burden. Pt continues to benefit from skilled PT services to progress toward functional mobility goals.    Recommendations for follow up therapy are one component of a multi-disciplinary discharge planning process, led by the attending physician.  Recommendations may be updated based on patient status, additional functional criteria and insurance authorization.  Follow Up Recommendations       Assistance Recommended at Discharge Frequent or constant Supervision/Assistance  Patient can return home with the following Two people to help with walking and/or transfers;A lot of help with bathing/dressing/bathroom;Assistance with cooking/housework;Assist for transportation;Help with stairs or ramp for entrance   Equipment Recommendations   (TBD at next venue)    Recommendations  for Other Services       Precautions / Restrictions Precautions Precautions: Fall Precaution Comments: Lt hemi Restrictions Weight Bearing Restrictions: No     Mobility  Bed Mobility Overal bed mobility: Needs Assistance Bed Mobility: Sit to Supine       Sit to supine: Max assist, +2 for physical assistance   General bed mobility comments: max A +2 for all aspects    Transfers Overall transfer level: Needs assistance Equipment used: Rolling walker (2 wheels) Transfers: Sit to/from Stand, Bed to chair/wheelchair/BSC Sit to Stand: +2 safety/equipment, +2 physical assistance, Mod assist, Max assist Stand pivot transfers: Max assist, +2 physical assistance Step pivot transfers: Total assist, +2 physical assistance       General transfer comment: mod A +2 to rise from [facility] with L knee blocked and cues for extending [name], max A +2 to rise from EOB, attempting step pivot however pt unable to shift weight to L with L  knee blocked and max tactctile cues to step RLE to complete step pivot, max A +2 to stand pivot    Ambulation/Gait               General Gait Details: with max A +2 and RW support pt unable to shift weight in static standing to advance LEs to progress gait despite blocking L knee and tactile cues to advance RLE   Stairs             Wheelchair Mobility    Modified Rankin (Stroke Patients Only)       Balance Overall balance assessment: Needs assistance Sitting-balance support: Feet supported Sitting balance-Leahy Scale: Poor Sitting balance - Comments: posterior LOB in sitting needing min A to maintain sitting balance EOB this session Postural control: Posterior lean  Standing balance support: During functional activity, Bilateral upper extremity supported, Reliant on assistive device for balance Standing balance-Leahy Scale: Poor Standing balance comment: reliant on RW and external support of therapist                             Cognition Arousal/Alertness: Awake/alert Behavior During Therapy: Flat affect Overall Cognitive Status: Within Functional Limits for tasks assessed                                          Exercises      General Comments General comments (skin integrity, edema, etc.): VSS on RA      Pertinent Vitals/Pain Pain Assessment Pain Assessment: Faces Faces Pain Scale: Hurts little more Pain Location: LLE, groin Pain Descriptors / Indicators: Discomfort, Grimacing, Tingling Pain Intervention(s): Monitored during session, Limited activity within patient's tolerance, Repositioned    Home Living                          Prior Function            PT Goals (current goals can now be found in the care plan section) Acute Rehab PT Goals PT Goal Formulation: With patient Time For Goal Achievement: 07/23/22 Progress towards PT goals: Progressing toward goals    Frequency    Min 4X/week      PT Plan      Co-evaluation              AM-PAC PT "6 Clicks" Mobility   Outcome Measure  Help needed turning from your back to your side while in a flat bed without using bedrails?: A Little Help needed moving from lying on your back to sitting on the side of a flat bed without using bedrails?: A Lot Help needed moving to and from a bed to a chair (including a wheelchair)?: Total Help needed standing up from a chair using your arms (e.g., wheelchair or bedside chair)?: Total Help needed to walk in hospital room?: Total Help needed climbing 3-5 steps with a railing? : Total 6 Click Score: 9    End of Session Equipment Utilized During Treatment: Gait belt Activity Tolerance: Patient tolerated treatment well Patient left: with call bell/phone within reach;in bed Nurse Communication: Mobility status PT Visit Diagnosis: Unsteadiness on feet (R26.81);Other abnormalities of gait and mobility (R26.89);Ataxic gait (R26.0);Muscle weakness (generalized)  (M62.81);Hemiplegia and hemiparesis;Other symptoms and signs involving the nervous system (R29.898);Difficulty in walking, not elsewhere classified (R26.2) Hemiplegia - Right/Left: Left Hemiplegia - dominant/non-dominant: Non-dominant Hemiplegia - caused by: Unspecified     Time: 1610-9604 PT Time Calculation (min) (ACUTE ONLY): 17 min  Charges:  $Therapeutic Activity: 8-22 mins                     Veryl Abril R. PTA Acute Rehabilitation Services Office: [phone]   Catalina Antigua 07/10/2022, 9:38 AM

## 2022-07-10 NOTE — Progress Notes (Signed)
Pt reports left sided weakness but is able to move arm.

## 2022-07-10 NOTE — Progress Notes (Signed)
Inpatient Rehab Admissions Coordinator:   Per therapy recommendations, patient was screened for CIR candidacy by Kingslee Dowse, MS, CCC-SLP. At this time, Pt. is not yet at a level to tolerate the intensity of CIR; however,   Pt. may have potential to progress to becoming a potential CIR candidate, so CIR admissions team will follow and monitor for progress and participation with therapies and place consult order if Pt. appears to be an appropriate candidate. Please contact me with any questions.   Carigan Lister, MS, CCC-SLP Rehab Admissions Coordinator  [phone] (celll) [phone] (office)  

## 2022-07-10 NOTE — Progress Notes (Signed)
Text from nursing via EPIC chat  Being seen by ID for HSV in CSF. I received a call from [facility] where he transferred from stating that a test ordered on his CSF called IGG INL was "quantity insufficient". He had multiple tests ordered from that sample and they ran out of fluid.

## 2022-07-10 NOTE — Progress Notes (Signed)
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  Review of resistance per the [employer] HIV drug resistance database:  Nucleoside Reverse Transcriptase Inhibitors abacavir (ABC) Low-Level Resistance zidovudine (AZT) Intermediate Resistance emtricitabine (FTC) High-Level Resistance lamivudine (3TC) High-Level Resistance tenofovir (TDF) Susceptible  Non-nucleoside Reverse Transcriptase Inhibitors doravirine (DOR) Susceptible efavirenz (EFV) High-Level Resistance etravirine (ETR) Susceptible nevirapine (NVP) High-Level Resistance rilpivirine (RPV) Susceptible  Previously noted regimen from chart notes in 2017 to be on Descovy (TAF + Emtricitabine) + Prezista (Darunavir) + Etravirine  HIV VL 320k on 07/08/22, CD4 poor collection  Plan - Resume ART with Biktarvy once daily per ID-MD - Rechecking CD4 - Bactrim added for PCP px  Thank you for allowing pharmacy to be a part of this patient's care.  Georgina Pillion, PharmD, BCPS Infectious Diseases Clinical Pharmacist 07/10/2022 3:45 PM   **Pharmacist phone directory can now be found on [email or web address] ([email or web address]).  Listed under [facility].

## 2022-07-10 NOTE — TOC Initial Note (Signed)
Transition of Care [facility]) - Initial/Assessment Note    Patient Details  Name: Jose Lamb MRN: [id number] Date of Birth: Oct 10, 1960  [facility] of [facility]) CM/SW Contact:    Mearl Latin, LCSW Phone Number: 07/10/2022, 6:20 PM  Clinical Narrative:                 TOC following for rehab discharge needs.   Expected Discharge Plan: IP Rehab Facility Barriers to Discharge: Continued Medical Work up, English as a second language teacher   Patient Goals and CMS Choice            Expected Discharge Plan and Services In-house Referral: Clinical Social Work     Living arrangements for the past 2 months: Single Family Home                                      Prior Living Arrangements/Services Living arrangements for the past 2 months: Single Family Home Lives with:: Spouse Patient language and need for interpreter reviewed:: Yes Do you feel safe going back to the place where you live?: Yes      Need for Family Participation in Patient Care: Yes (Comment) Care giver support system in place?: Yes (comment)   Criminal Activity/Legal Involvement Pertinent to Current Situation/Hospitalization: No - Comment as needed  Activities of Daily Living Home Assistive Devices/Equipment: Crutches, Wheelchair ADL Screening (condition at time of admission) Patient's cognitive ability adequate to safely complete daily activities?: Yes Is the patient deaf or have difficulty hearing?: No Does the patient have difficulty seeing, even when wearing glasses/contacts?: No Does the patient have difficulty concentrating, remembering, or making decisions?: No Patient able to express need for assistance with ADLs?: Yes Does the patient have difficulty dressing or bathing?: Yes Independently performs ADLs?: No Communication: Independent Dressing (OT): Needs assistance Is this a change from baseline?: Pre-admission baseline Grooming: Needs assistance Is this a change from baseline?: Pre-admission  baseline Feeding: Independent Bathing: Needs assistance Is this a change from baseline?: Pre-admission baseline Toileting: Needs assistance Is this a change from baseline?: Pre-admission baseline In/Out Bed: Needs assistance Is this a change from baseline?: Pre-admission baseline Walks in Home: Independent with device (comment) Does the patient have difficulty walking or climbing stairs?: Yes Weakness of Legs: Left Weakness of Arms/Hands: Left  Permission Sought/Granted Permission sought to share information with : Facility Medical sales representative, Family Supports                Emotional Assessment       Orientation: : Oriented to Self, Oriented to Place, Oriented to  Time, Oriented to Situation Alcohol / Substance Use: Not Applicable Psych Involvement: No (comment)  Admission diagnosis:  Weakness [R53.1] Left-sided weakness [R53.1] Patient Active Problem List   Diagnosis Date Noted   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   Parotid gland enlargement 07/10/2022   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   AIDS (acquired immune deficiency syndrome) (HCC) 07/10/2022   HIV disease (HCC) 07/08/2022   Weakness 07/08/2022   Left-sided weakness 07/07/2022   Acute metabolic encephalopathy 04/06/2022   Sepsis due to pneumonia (HCC) 04/06/2022   Umbilical hernia without obstruction and without gangrene 12/16/2021   SBO (small bowel obstruction) (HCC) 12/14/2021   History of pulmonary embolus (PE) 12/14/2021   Hypokalemia 12/14/2021   CAP (community acquired pneumonia) 12/14/2021   Diabetes mellitus type 2 in obese 12/14/2021   GERD (gastroesophageal reflux disease)  12/14/2021   Acute pulmonary embolus (HCC) 09/19/2019   Acute respiratory failure with hypoxia (HCC)    Acute respiratory disease due to COVID-19 virus 09/15/2019   Pneumonia due to COVID-19 virus 09/15/2019   Ectatic aorta (HCC) 04/17/2018   Pre-diabetes  11/26/2014   Asthma 11/05/2014   Paroxysmal atrial fibrillation (HCC) 06/30/2014   Graves' disease 04/01/2014   Constipation 03/24/2014   Hypertension 02/06/2014   Moderate malnutrition (HCC) 02/06/2014   Peptic ulcer with perforation (HCC)    Duodenal ulcer perforation (HCC) 02/04/2014   HIV (human immunodeficiency virus infection) (HCC) 02/04/2014   Perforated duodenal ulcer (HCC) 02/04/2014   Neuropathic pain 03/13/2013   Class 1 obesity 07/04/2012   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

## 2022-07-10 NOTE — Progress Notes (Signed)
PROGRESS NOTE                                                                                                                                                                                                             Patient Demographics:    Jose Lamb, is a 62 y.o. male, DOB - 14-Sep-1960, ZOX:[id number]  Outpatient Primary MD for the patient is Ladon Applebaum    LOS - 3  Admit date - 07/07/2022    Chief Complaint  Patient presents with   Weakness       Brief Narrative (HPI from H&P)    62 y.o. male with medical history significant for prior CVA, paroxysmal atrial fibrillation not on oral anticoagulation, asthma, HIV with noncompliance, history of pulmonary embolism, who presents to [facility] ED with complaints of 6 weeks of left-sided weakness involving his left arm and left leg.  Associated with frequent falls.  Has not been taking his ART medications.  MRI of the brain showing right-sided T2 hypertense signal on the MRI primarily cortical/subcortical, in the right greater than left insula and postcentral gyrus, neurology and ID were consulted and he was admitted to the hospital for further care.   Subjective:    Jose Lamb today has, No headache, No chest pain, No abdominal pain - No Nausea, No new weakness tingling or numbness, no SOB, ++ L Leg weakness   Assessment  & Plan :    Left-sided weakness leg significantly more weak than the arm.  MRI of the brain suggestive of T2 hypertense signal primarily cortical/subcortical, in the right greater than left insula and postcentral gyrus, neurology and ID on board, he is noncompliant with his HIV medications.  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  Continue supportive care with PT OT.   HIV.  Noncompliant with medications, counseled on  compliance, ID on board defer management to ID.  He is now agreeable to taking his HIV medications as prescribed.  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  Per ID requested ENT input, per ENT CT guided biopsy by IR if required or else outpatient ENT follow-up.  Discussed with Dr. Murlean Hark and ID physician Dr. Gwynn Burly.  ID  contemplating CT-guided biopsy of the parotid gland.  Dyslipidemia.  Placed on statin.  Asthma.  Stable no acute issues.  Obesity.  BMI 32.  Follow-up with PCP for weight loss.      Condition -   Guarded  Family Communication  :  None  Code Status :  Full  Consults  :  ID, Neuro, ENT  PUD Prophylaxis :    Procedures  :     MRI - 1. Abnormal T2 hyperintense signal, primarily cortical/subcortical, in the right greater than left insula and postcentral gyrus, as well as in the splenium and left body of the corpus callosum and possibly the right thalamus and midbrain. This is nonspecific, and while this could reflect sequela of prior infarcts, it is also concerning for an inflammatory or infectious process, such as PML, autoimmune encephalitis, or herpes simplex encephalitis, although this is not particularly typical of any the entities. MRI with contrast could be helpful. Correlate with symptoms and consider CSF testing. 2. Bilateral parotid masses are better seen on the prior CT and are incompletely imaged on this exam.      Disposition Plan  :      DVT Prophylaxis  :    heparin injection 5,000 Units Start: 07/08/22 1400    Lab Results  Component Value Date   PLT 108 (L) 07/08/2022    Diet :  Diet Order             Diet NPO time specified Except for: Sips with Meds  Diet effective midnight                    Inpatient Medications  Scheduled Meds:  vitamin B-12  2,000 mcg Oral Daily   heparin  5,000 Units Subcutaneous Q8H   rosuvastatin  10 mg Oral Daily   Continuous Infusions:  acyclovir 267 mL/hr  at 07/10/22 0644   lactated ringers 50 mL/hr at 07/10/22 0644   PRN Meds:.acetaminophen, albuterol, HYDROmorphone (DILAUDID) injection, mouth rinse, oxyCODONE, polyethylene glycol, prochlorperazine  Antibiotics  :    Anti-infectives (From admission, onward)    Start     Dose/Rate Route Frequency Ordered Stop   07/08/22 2315  acyclovir (ZOVIRAX) 1,000 mg in dextrose 5 % 250 mL IVPB  Status:  Discontinued        1,000 mg 270 mL/hr over 60 Minutes Intravenous Every 8 hours 07/08/22 2221 07/08/22 2229   07/08/22 2315  acyclovir (ZOVIRAX) 870 mg in dextrose 5 % 250 mL IVPB        10 mg/kg  86.9 kg (Adjusted) 267.4 mL/hr over 60 Minutes Intravenous Every 8 hours 07/08/22 2229           Objective:   Vitals:   07/09/22 1746 07/09/22 1955 07/10/22 0000 07/10/22 0453  BP: 127/76 139/87 104/78 128/87  Pulse:  89 78 78  Resp: 20 16 20 20   Temp: 99.1 F (37.3 C) 100.2 F (37.9 C)  99.8 F (37.7 C)  TempSrc: Oral Oral  Oral  SpO2:  94% 93% 95%  Weight:      Height:        Wt Readings from Last 3 Encounters:  07/07/22 104.3 kg  07/05/22 104.3 kg  04/08/22 106.7 kg     Intake/Output Summary (Last 24 hours) at 07/10/2022 0805 Last data filed at 07/10/2022 0644 Gross per 24 hour  Intake 1876.9 ml  Output 500 ml  Net 1376.9 ml     Physical Exam  Awake Alert, No new  F.N deficits, L leg 2/5, L arm mildly weak [place].AT,PERRAL Supple Neck, No JVD,   Symmetrical Chest wall movement, Good air movement bilaterally, CTAB RRR,No Gallops,Rubs or new Murmurs,  +ve B.Sounds, Abd Soft, No tenderness,   No Cyanosis, Clubbing or edema        Data Review:    Recent Labs  Lab 07/05/22 0919 07/07/22 1819 07/07/22 1841 07/08/22 0500  WBC 6.0 4.1  --  3.9*  HGB 12.6* 13.1 12.9* 12.1*  HCT 37.4* 39.9 38.0* 37.7*  PLT 115* 112*  --  108*  MCV 89.9 91.5  --  91.7  MCH 30.3 30.0  --  29.4  MCHC 33.7 32.8  --  32.1  RDW 14.2 14.4  --  14.4  LYMPHSABS 1.2 1.0  --   --   MONOABS 0.7  0.6  --   --   EOSABS 0.2 0.2  --   --   BASOSABS 0.0 0.0  --   --     Recent Labs  Lab 07/05/22 0919 07/05/22 0946 07/05/22 1141 07/07/22 1819 07/07/22 1841 07/08/22 0500 07/08/22 1405 07/09/22 0323 07/10/22 0546  NA 136  --   --  135 140 138  --  136 134*  K 3.2*  --   --  3.4* 3.6 3.2*  --  3.6 3.8  CL 102  --   --  102 101 105  --  104 102  CO2 25  --   --  25  --  28  --  24 26  ANIONGAP 9  --   --  8  --  5  --  8 6  GLUCOSE 97  --   --  95 89 104*  --  100* 99  BUN 11  --   --  14 12 13   --  10 7  CREATININE 0.76  --   --  0.78 0.70 0.79  --  0.77 0.75  AST 16  --   --  21  --   --   --   --   --   ALT 19  --   --  24  --   --   --   --   --   ALKPHOS 66  --   --  67  --   --   --   --   --   BILITOT 0.9  --   --  0.6  --   --   --   --   --   ALBUMIN 3.3*  --   --  3.6  --   --  NOT PERFORMED  --   --   LATICACIDVEN  --  0.7 0.7  --   --   --   --   --   --   INR 1.1  --   --  1.0  --   --   --   --  1.1  MG  --   --   --   --   --  2.3  --   --   --   CALCIUM 8.5*  --   --  8.4*  --  8.6*  --  8.3* 8.6*      Recent Labs  Lab 07/05/22 0919 07/05/22 0946 07/05/22 1141 07/07/22 1819 07/08/22 0500 07/09/22 0323 07/10/22 0546  LATICACIDVEN  --  0.7 0.7  --   --   --   --   INR 1.1  --   --  1.0  --   --  1.1  MG  --   --   --   --  2.3  --   --   CALCIUM 8.5*  --   --  8.4* 8.6* 8.3* 8.6*    Recent Labs  Lab 07/05/22 0919 07/05/22 0946 07/05/22 1141 07/07/22 1819 07/07/22 1841 07/08/22 0500 07/09/22 0323 07/10/22 0546  WBC 6.0  --   --  4.1  --  3.9*  --   --   PLT 115*  --   --  112*  --  108*  --   --   LATICACIDVEN  --  0.7 0.7  --   --   --   --   --   CREATININE 0.76  --   --  0.78 0.70 0.79 0.77 0.75    ------------------------------------------------------------------------------------------------------------------ Lab Results  Component Value Date   CHOL 197 04/10/2022   HDL 39 (L) 04/10/2022   LDLCALC 126 (H) 04/10/2022   TRIG 158  (H) 04/10/2022   CHOLHDL 5.1 04/10/2022    Lab Results  Component Value Date   HGBA1C 6.6 (H) 04/10/2022    No results for input(s): "TSH", "T4TOTAL", "T3FREE", "THYROIDAB" in the last 72 hours.  Invalid input(s): "FREET3" ------------------------------------------------------------------------------------------------------------------ Cardiac Enzymes No results for input(s): "CKMB", "TROPONINI", "MYOGLOBIN" in the last 168 hours.  Invalid input(s): "CK"  Micro Results Recent Results (from the past 240 hour(s))  Urine Culture     Status: Abnormal   Collection Time: 07/05/22  9:19 AM   Specimen: Urine, Clean Catch  Result Value Ref Range Status   Specimen Description   Final    URINE, CLEAN CATCH Performed at [facility], [address] [place], [place] [address]    Special Requests   Final    NONE Reflexed from [id number] Performed at [facility], [address] [place], [place] [address]    Culture MULTIPLE SPECIES PRESENT, SUGGEST RECOLLECTION (A)  Final   Report Status 07/06/2022 FINAL  Final  Blood Culture (routine x 2)     Status: None (Preliminary result)   Collection Time: 07/05/22  9:46 AM   Specimen: Left Antecubital; Blood  Result Value Ref Range Status   Specimen Description LEFT ANTECUBITAL  Final   Special Requests   Final    BOTTLES DRAWN AEROBIC AND ANAEROBIC Blood Culture results may not be optimal due to an excessive volume of blood received in culture bottles   Culture   Final    NO GROWTH 4 DAYS Performed at [facility], [address], [place], [place] [address]    Report Status PENDING  Incomplete  Blood Culture (routine x 2)     Status: None (Preliminary result)   Collection Time: 07/05/22  9:46 AM   Specimen: BLOOD RIGHT ARM  Result Value Ref Range Status   Specimen Description BLOOD RIGHT ARM  Final   Special Requests   Final    BOTTLES DRAWN AEROBIC AND ANAEROBIC Blood Culture results may not be optimal due to an excessive volume of  blood received in culture bottles   Culture   Final    NO GROWTH 4 DAYS Performed at [facility], [address], [place], [place] [address]    Report Status PENDING  Incomplete  Resp panel by RT-PCR (RSV, Flu A&B, Covid) Anterior Nasal Swab     Status: None   Collection Time: 07/05/22  9:47 AM   Specimen: Anterior Nasal Swab  Result Value Ref Range Status  SARS Coronavirus 2 by RT PCR NEGATIVE NEGATIVE Final    Comment: (NOTE) SARS-CoV-2 target nucleic acids are NOT DETECTED.  The SARS-CoV-2 RNA is generally detectable in upper respiratory specimens during the acute phase of infection. The lowest concentration of SARS-CoV-2 viral copies this assay can detect is 138 copies/mL. A negative result does not preclude SARS-Cov-2 infection and should not be used as the sole basis for treatment or other patient management decisions. A negative result may occur with  improper specimen collection/handling, submission of specimen other than nasopharyngeal swab, presence of viral mutation(s) within the areas targeted by this assay, and inadequate number of viral copies(<138 copies/mL). A negative result must be combined with clinical observations, patient history, and epidemiological information. The expected result is Negative.  Fact Sheet for Patients:  BloggerCourse.com  Fact Sheet for Healthcare Providers:  SeriousBroker.it  This test is no t yet approved or cleared by the [place] FDA and  has been authorized for detection and/or diagnosis of SARS-CoV-2 by FDA under an Emergency Use Authorization (EUA). This EUA will remain  in effect (meaning this test can be used) for the duration of the COVID-19 declaration under Section 564(b)(1) of the Act, 21 U.S.C.section 360bbb-3(b)(1), unless the authorization is terminated  or revoked sooner.       Influenza A by PCR NEGATIVE NEGATIVE Final   Influenza B by PCR NEGATIVE NEGATIVE  Final    Comment: (NOTE) The Xpert Xpress SARS-CoV-2/FLU/RSV plus assay is intended as an aid in the diagnosis of influenza from Nasopharyngeal swab specimens and should not be used as a sole basis for treatment. Nasal washings and aspirates are unacceptable for Xpert Xpress SARS-CoV-2/FLU/RSV testing.  Fact Sheet for Patients: BloggerCourse.com  Fact Sheet for Healthcare Providers: SeriousBroker.it  This test is not yet approved or cleared by the [place] FDA and has been authorized for detection and/or diagnosis of SARS-CoV-2 by FDA under an Emergency Use Authorization (EUA). This EUA will remain in effect (meaning this test can be used) for the duration of the COVID-19 declaration under Section 564(b)(1) of the Act, 21 U.S.C. section 360bbb-3(b)(1), unless the authorization is terminated or revoked.     Resp Syncytial Virus by PCR NEGATIVE NEGATIVE Final    Comment: (NOTE) Fact Sheet for Patients: BloggerCourse.com  Fact Sheet for Healthcare Providers: SeriousBroker.it  This test is not yet approved or cleared by the [place] FDA and has been authorized for detection and/or diagnosis of SARS-CoV-2 by FDA under an Emergency Use Authorization (EUA). This EUA will remain in effect (meaning this test can be used) for the duration of the COVID-19 declaration under Section 564(b)(1) of the Act, 21 U.S.C. section 360bbb-3(b)(1), unless the authorization is terminated or revoked.  Performed at [facility], [address] [place], [place] [address]   Urine Culture     Status: Abnormal   Collection Time: 07/07/22  7:21 PM   Specimen: Urine, Clean Catch  Result Value Ref Range Status   Specimen Description   Final    URINE, CLEAN CATCH Performed at [facility], [address], [place], [place] [address]    Special Requests   Final     NONE Performed at [facility], [address], [place], [place] [address]    Culture (A)  Final    <10,000 COLONIES/mL INSIGNIFICANT GROWTH Performed at [facility], [address] [place], [place] [address]    Report Status 07/08/2022 FINAL  Final  CSF culture w Gram  Stain     Status: None (Preliminary result)   Collection Time: 07/08/22 12:30 PM   Specimen: CSF; Cerebrospinal Fluid  Result Value Ref Range Status   Specimen Description   Final    CSF Performed at [facility], [address], [place], [place] [address]    Special Requests   Final    NONE Performed at [facility], [address], [place], [place] [address]    Gram Stain   Final    WBC PRESENT,BOTH PMN AND MONONUCLEAR NO ORGANISMS SEEN CYTOSPIN Gram Stain Report Called to,Read Back By and Verified With: Derl Barrow RN @1425  ON 6.1.2024 BY [facility] Performed at [facility], [address], [place], [place] [address]    Culture   Final    NO GROWTH < 24 HOURS Performed at [facility], [address], [place], [place] [address]    Report Status PENDING  Incomplete    Radiology Reports DG Lumbar Puncture Fluoro Guide  Result Date: 07/09/2022 CLINICAL DATA:  Left-sided weakness with concern for encephalitis. Request for image guided lumbar puncture. EXAM: LUMBAR PUNCTURE UNDER FLUOROSCOPY PROCEDURE: An appropriate skin entry site was determined fluoroscopically. Operator donned sterile gloves and mask. Skin site was marked, then prepped with Betadine, draped in usual sterile fashion, and infiltrated locally with 1% lidocaine. A 6 inch, 20 gauge spinal needle advanced into the thecal sac at L3-4 from a right interlaminar approach. Clear colorless CSF spontaneously returned, with opening pressure of 25 cm water. Approximately 13 ml CSF were collected and divided among 4 sterile vials for the requested laboratory studies. The needle  was then removed. The patient tolerated the procedure well and there were no complications. FLUOROSCOPY: Radiation Exposure Index (as provided by the fluoroscopic device): 6.5 mGy Kerma IMPRESSION: Technically successful lumbar puncture under fluoroscopy. This exam was performed by Corrin Parker, [name], and was supervised and interpreted by Dr. Marliss Coots. Electronically Signed   By: Marliss Coots M.D.   On: 07/09/2022 18:03   MR BRAIN W CONTRAST  Result Date: 07/08/2022 CLINICAL DATA:  Headache, no red flags EXAM: MRI HEAD WITH CONTRAST TECHNIQUE: Multiplanar, multiecho pulse sequences of the brain and surrounding structures were obtained with intravenous contrast. CONTRAST:  10mL GADAVIST GADOBUTROL 1 MMOL/ML IV SOLN COMPARISON:  MRI head 07/07/2022 FINDINGS: Axial T1 pre and postcontrast, coronal T1 postcontrast, and coronal T2 sequences obtained. No abnormal parenchymal or meningeal enhancement. Redemonstrated areas of T1 hyperintense signal in the right greater than left insula and frontoparietal region. IMPRESSION: No abnormal parenchymal or meningeal enhancement. Electronically Signed   By: Wiliam Ke M.D.   On: 07/08/2022 20:32   MR BRAIN WO CONTRAST  Result Date: 07/07/2022 CLINICAL DATA:  Transient ischemic attack EXAM: MRI HEAD WITHOUT CONTRAST TECHNIQUE: Multiplanar, multiecho pulse sequences of the brain and surrounding structures were obtained without intravenous contrast. COMPARISON:  None Available. FINDINGS: Brain: Increased T2 hyperintense signal along the right-greater-than-left insula (series 9, image 27), which is associated with mildly increased signal on diffusion-weighted imaging, with decreased intensity on the ADC map. On the right, this is associated with a more focal area of T1 and T2 hyperintense signal, with surrounding edema (series 8, image 18, series 9, image 35 and series 12, image 111). T2 hyperintense signal is also noted in right thalamus, extending into the midbrain  (series 4, images 24-25), although this could be related to wallerian degeneration. Similar abnormal cortical signal is noted in the right postcentral gyrus (series  9, image 46), which is associated with a similar T2 hyperintense, T1 hypointense area with peripheral edema in the right frontoparietal region (series 9, image 39). Less significant T2 hyperintense signal is noted in the left postcentral gyrus (series 9, image 36), without the more superior cortical involvement. Increased T2 signal in the splenium of the corpus callosum (series 9, image 31) and in the left body (series 9, image 35), which is not definitively associated with restricted diffusion. No acute hemorrhage, mass, mass effect, or midline shift. No hydrocephalus or extra-axial collection. Partial empty sella. Craniocervical junction. No hemosiderin deposition to suggest remote hemorrhage. Vascular: Normal arterial flow voids. Skull and upper cervical spine: Normal marrow signal. Sinuses/Orbits: Mucosal thickening left maxillary sinus. No acute finding in the orbits. Other: The mastoid air cells are well aerated. Bilateral parotid masses are better seen on the prior CT and are incompletely imaged on this exam IMPRESSION: 1. Abnormal T2 hyperintense signal, primarily cortical/subcortical, in the right greater than left insula and postcentral gyrus, as well as in the splenium and left body of the corpus callosum and possibly the right thalamus and midbrain. This is nonspecific, and while this could reflect sequela of prior infarcts, it is also concerning for an inflammatory or infectious process, such as PML, autoimmune encephalitis, or herpes simplex encephalitis, although this is not particularly typical of any the entities. MRI with contrast could be helpful. Correlate with symptoms and consider CSF testing. 2. Bilateral parotid masses are better seen on the prior CT and are incompletely imaged on this exam. These results were called by telephone  at the time of interpretation on 07/07/2022 at 10:30 pm to provider [facility] , who verbally acknowledged these results. Electronically Signed   By: Wiliam Ke M.D.   On: 07/07/2022 22:39      Signature  -   Susa Raring M.D on 07/10/2022 at 8:05 AM   -  To page go to [email or web address]

## 2022-07-10 NOTE — Plan of Care (Signed)

## 2022-07-10 NOTE — Procedures (Signed)
Pre Procedure Dx: Bilateral parotid cysts Post Procedural Dx: Same  Technically successful US guided aspiration of bilateral parotid cysts, the right yielding 25 cc of tan fluid, the left yielding 10 cc of similar appearing tan fluid. Samples sent separately to the lab for analysis.   EBL: None No immediate complications.   Katherina Right, MD Pager #: [phone]

## 2022-07-10 NOTE — Progress Notes (Addendum)
Subjective:  Patient with ongoing left sided weakness   Antibiotics:  Anti-infectives (From admission, onward)    Start     Dose/Rate Route Frequency Ordered Stop   07/10/22 1615  bictegravir-emtricitabine-tenofovir AF (BIKTARVY) 50-200-25 MG per tablet 1 tablet        1 tablet Oral Daily 07/10/22 1515     07/08/22 2315  acyclovir (ZOVIRAX) 1,000 mg in dextrose 5 % 250 mL IVPB  Status:  Discontinued        1,000 mg 270 mL/hr over 60 Minutes Intravenous Every 8 hours 07/08/22 2221 07/08/22 2229   07/08/22 2315  acyclovir (ZOVIRAX) 870 mg in dextrose 5 % 250 mL IVPB        10 mg/kg  86.9 kg (Adjusted) 267.4 mL/hr over 60 Minutes Intravenous Every 8 hours 07/08/22 2229         Medications: Scheduled Meds:  bictegravir-emtricitabine-tenofovir AF  1 tablet Oral Daily   vitamin B-12  2,000 mcg Oral Daily   heparin  5,000 Units Subcutaneous Q8H   rosuvastatin  10 mg Oral Daily   Continuous Infusions:  acyclovir 870 mg (07/10/22 1500)   lactated ringers 50 mL/hr at 07/10/22 0644   PRN Meds:.acetaminophen, albuterol, HYDROmorphone (DILAUDID) injection, mouth rinse, oxyCODONE, polyethylene glycol, prochlorperazine    Objective: Weight change:   Intake/Output Summary (Last 24 hours) at 07/10/2022 1515 Last data filed at 07/10/2022 0644 Gross per 24 hour  Intake 1876.9 ml  Output 500 ml  Net 1376.9 ml   Blood pressure 132/81, pulse 92, temperature 100.1 F (37.8 C), temperature source Oral, resp. rate 19, height 5\' 11"  (1.803 m), weight 104.3 kg, SpO2 95 %. Temp:  [99.1 F (37.3 C)-101.2 F (38.4 C)] 100.1 F (37.8 C) (06/03 1246) Pulse Rate:  [78-92] 92 (06/03 1246) Resp:  [16-20] 19 (06/03 0800) BP: (104-142)/(76-103) 132/81 (06/03 1246) SpO2:  [93 %-95 %] 95 % (06/03 0453)  Physical Exam: Physical Exam Constitutional:      Appearance: He is obese.  Pulmonary:     Effort: Pulmonary effort is normal. No respiratory distress.  Neurological:     Mental  Status: He is alert and oriented to person, place, and time.     Comments: Significant left sided weakness      CBC:    BMET Recent Labs    07/09/22 0323 07/10/22 0546  NA 136 134*  K 3.6 3.8  CL 104 102  CO2 24 26  GLUCOSE 100* 99  BUN 10 7  CREATININE 0.77 0.75  CALCIUM 8.3* 8.6*     Liver Panel  Recent Labs    07/07/22 1819 07/08/22 1405  PROT 7.7  --   ALBUMIN 3.6 NOT PERFORMED  AST 21  --   ALT 24  --   ALKPHOS 67  --   BILITOT 0.6  --        Sedimentation Rate No results for input(s): "ESRSEDRATE" in the last 72 hours. C-Reactive Protein No results for input(s): "CRP" in the last 72 hours.  Micro Results: Recent Results (from the past 720 hour(s))  Urine Culture     Status: Abnormal   Collection Time: 07/05/22  9:19 AM   Specimen: Urine, Clean Catch  Result Value Ref Range Status   Specimen Description   Final    URINE, CLEAN CATCH Performed at [facility], [address], [place], [place] [address]    Special Requests   Final    NONE Reflexed from  [id number] Performed at [facility], [address], [place], [place] [address]    Culture MULTIPLE SPECIES PRESENT, SUGGEST RECOLLECTION (A)  Final   Report Status 07/06/2022 FINAL  Final  Blood Culture (routine x 2)     Status: None (Preliminary result)   Collection Time: 07/05/22  9:46 AM   Specimen: Left Antecubital; Blood  Result Value Ref Range Status   Specimen Description LEFT ANTECUBITAL  Final   Special Requests   Final    BOTTLES DRAWN AEROBIC AND ANAEROBIC Blood Culture results may not be optimal due to an excessive volume of blood received in culture bottles   Culture   Final    NO GROWTH 4 DAYS Performed at [facility], [address] [place], [place] [address]    Report Status PENDING  Incomplete  Blood Culture (routine x 2)     Status: None (Preliminary result)   Collection Time: 07/05/22  9:46 AM   Specimen: BLOOD RIGHT ARM  Result Value Ref Range Status   Specimen  Description BLOOD RIGHT ARM  Final   Special Requests   Final    BOTTLES DRAWN AEROBIC AND ANAEROBIC Blood Culture results may not be optimal due to an excessive volume of blood received in culture bottles   Culture   Final    NO GROWTH 4 DAYS Performed at [facility], [address] [place], [place] [address]    Report Status PENDING  Incomplete  Resp panel by RT-PCR (RSV, Flu A&B, Covid) Anterior Nasal Swab     Status: None   Collection Time: 07/05/22  9:47 AM   Specimen: Anterior Nasal Swab  Result Value Ref Range Status   SARS Coronavirus 2 by RT PCR NEGATIVE NEGATIVE Final    Comment: (NOTE) SARS-CoV-2 target nucleic acids are NOT DETECTED.  The SARS-CoV-2 RNA is generally detectable in upper respiratory specimens during the acute phase of infection. The lowest concentration of SARS-CoV-2 viral copies this assay can detect is 138 copies/mL. A negative result does not preclude SARS-Cov-2 infection and should not be used as the sole basis for treatment or other patient management decisions. A negative result may occur with  improper specimen collection/handling, submission of specimen other than nasopharyngeal swab, presence of viral mutation(s) within the areas targeted by this assay, and inadequate number of viral copies(<138 copies/mL). A negative result must be combined with clinical observations, patient history, and epidemiological information. The expected result is Negative.  Fact Sheet for Patients:  BloggerCourse.com  Fact Sheet for Healthcare Providers:  SeriousBroker.it  This test is no t yet approved or cleared by the [place] FDA and  has been authorized for detection and/or diagnosis of SARS-CoV-2 by FDA under an Emergency Use Authorization (EUA). This EUA will remain  in effect (meaning this test can be used) for the duration of the COVID-19 declaration under Section 564(b)(1) of the Act,  21 U.S.C.section 360bbb-3(b)(1), unless the authorization is terminated  or revoked sooner.       Influenza A by PCR NEGATIVE NEGATIVE Final   Influenza B by PCR NEGATIVE NEGATIVE Final    Comment: (NOTE) The Xpert Xpress SARS-CoV-2/FLU/RSV plus assay is intended as an aid in the diagnosis of influenza from Nasopharyngeal swab specimens and should not be used as a sole basis for treatment. Nasal washings and aspirates are unacceptable for Xpert Xpress SARS-CoV-2/FLU/RSV testing.  Fact Sheet for Patients: BloggerCourse.com  Fact Sheet for Healthcare Providers: SeriousBroker.it  This test is not yet approved or cleared by the [place] FDA and has been authorized for detection and/or diagnosis of SARS-CoV-2 by FDA under an Emergency Use Authorization (EUA). This EUA will remain in effect (meaning this test can be used) for the duration of the COVID-19 declaration under Section 564(b)(1) of the Act, 21 U.S.C. section 360bbb-3(b)(1), unless the authorization is terminated or revoked.     Resp Syncytial Virus by PCR NEGATIVE NEGATIVE Final    Comment: (NOTE) Fact Sheet for Patients: BloggerCourse.com  Fact Sheet for Healthcare Providers: SeriousBroker.it  This test is not yet approved or cleared by the [place] FDA and has been authorized for detection and/or diagnosis of SARS-CoV-2 by FDA under an Emergency Use Authorization (EUA). This EUA will remain in effect (meaning this test can be used) for the duration of the COVID-19 declaration under Section 564(b)(1) of the Act, 21 U.S.C. section 360bbb-3(b)(1), unless the authorization is terminated or revoked.  Performed at [facility], [address] [place], [place] [address]   Urine Culture     Status: Abnormal   Collection Time: 07/07/22  7:21 PM   Specimen: Urine, Clean Catch  Result Value Ref Range Status    Specimen Description   Final    URINE, CLEAN CATCH Performed at [facility], [address], [place], [place] [address]    Special Requests   Final    NONE Performed at [facility], [address] [place], [place] [address]    Culture (A)  Final    <10,000 COLONIES/mL INSIGNIFICANT GROWTH Performed at [facility], [address] [place], [place] [address]    Report Status 07/08/2022 FINAL  Final  CSF culture w Gram Stain     Status: None (Preliminary result)   Collection Time: 07/08/22 12:30 PM   Specimen: CSF; Cerebrospinal Fluid  Result Value Ref Range Status   Specimen Description   Final    CSF Performed at [facility], [address], [place], [place] [address]    Special Requests   Final    NONE Performed at [facility], [address] [place], [place] [address]    Gram Stain   Final    WBC PRESENT,BOTH PMN AND MONONUCLEAR NO ORGANISMS SEEN CYTOSPIN Gram Stain Report Called to,Read Back By and Verified With: Derl Barrow RN @1425  ON 6.1.2024 BY [facility] Performed at [facility], [address] [place], [place] [address]    Culture   Final    NO GROWTH 2 DAYS Performed at [facility], [address] [place], [place] [address]    Report Status PENDING  Incomplete    Studies/Results: MR BRAIN W CONTRAST  Result Date: 07/08/2022 CLINICAL DATA:  Headache, no red flags EXAM: MRI HEAD WITH CONTRAST TECHNIQUE: Multiplanar, multiecho pulse sequences of the brain and surrounding structures were obtained with intravenous contrast. CONTRAST:  10mL GADAVIST GADOBUTROL 1 MMOL/ML IV SOLN COMPARISON:  MRI head 07/07/2022 FINDINGS: Axial T1 pre and postcontrast, coronal T1 postcontrast, and coronal T2 sequences obtained. No abnormal parenchymal or meningeal enhancement. Redemonstrated areas of T1 hyperintense signal in the right greater than left insula and frontoparietal  region. IMPRESSION: No abnormal parenchymal or meningeal enhancement. Electronically Signed   By: Wiliam Ke M.D.   On: 07/08/2022 20:32      Assessment/Plan:  INTERVAL HISTORY: patient seems agreeable to starting ARV   Principal Problem:   Left-sided weakness Active Problems:   HIV disease (HCC)   Weakness    Jose Rakers  Lamb is a 62 y.o. 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  He underwent lumbar puncture which showed lymphocytic pleocytosis with a white count of 105 glucose of 48 protein of 70.  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  3.  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 Professional time spent includes the following activities: Preparing to see the patient (review of tests), Obtaining and/or reviewing separately obtained history (admission/discharge record), Performing a medically appropriate examination and/or evaluation , Ordering medications/tests/procedures, referring and communicating with other health care professionals, Documenting clinical information in the EMR, Independently interpreting results (not separately reported), Communicating results to  the patient/family/caregiver, Counseling and educating the patient/family/caregiver and Care coordination (not separately reported).         LOS: 3 days   Acey Lav 07/10/2022, 3:15 PM

## 2022-07-11 ENCOUNTER — Other Ambulatory Visit (HOSPITAL_COMMUNITY): Payer: Self-pay

## 2022-07-11 DIAGNOSIS — B003 Herpesviral meningitis: Secondary | ICD-10-CM | POA: Diagnosis not present

## 2022-07-11 DIAGNOSIS — B20 Human immunodeficiency virus [HIV] disease: Secondary | ICD-10-CM | POA: Diagnosis not present

## 2022-07-11 DIAGNOSIS — A812 Progressive multifocal leukoencephalopathy: Secondary | ICD-10-CM | POA: Diagnosis not present

## 2022-07-11 DIAGNOSIS — R531 Weakness: Secondary | ICD-10-CM | POA: Diagnosis not present

## 2022-07-11 LAB — CULTURE, BLOOD (ROUTINE X 2)
Culture: NO GROWTH
Culture: NO GROWTH

## 2022-07-11 LAB — CBC WITH DIFFERENTIAL/PLATELET
Abs Immature Granulocytes: 0.02 10*3/uL (ref 0.00–0.07)
Basophils Absolute: 0 10*3/uL (ref 0.0–0.1)
Basophils Relative: 1 %
Eosinophils Absolute: 0 10*3/uL (ref 0.0–0.5)
Eosinophils Relative: 1 %
HCT: 37.7 % — ABNORMAL LOW (ref 39.0–52.0)
Hemoglobin: 12.5 g/dL — ABNORMAL LOW (ref 13.0–17.0)
Immature Granulocytes: 0 %
Lymphocytes Relative: 26 %
Lymphs Abs: 1.4 10*3/uL (ref 0.7–4.0)
MCH: 28.9 pg (ref 26.0–34.0)
MCHC: 33.2 g/dL (ref 30.0–36.0)
MCV: 87.1 fL (ref 80.0–100.0)
Monocytes Absolute: 0.6 10*3/uL (ref 0.1–1.0)
Monocytes Relative: 11 %
Neutro Abs: 3.2 10*3/uL (ref 1.7–7.7)
Neutrophils Relative %: 61 %
Platelets: 97 10*3/uL — ABNORMAL LOW (ref 150–400)
RBC: 4.33 MIL/uL (ref 4.22–5.81)
RDW: 13.6 % (ref 11.5–15.5)
WBC: 5.3 10*3/uL (ref 4.0–10.5)
nRBC: 0 % (ref 0.0–0.2)

## 2022-07-11 LAB — CYTOLOGY - NON PAP

## 2022-07-11 LAB — MISC LABCORP TEST (SEND OUT)
LabCorp test name: 505535
Labcorp test code: 505535

## 2022-07-11 LAB — BASIC METABOLIC PANEL
Anion gap: 11 (ref 5–15)
BUN: 7 mg/dL (ref 6–20)
CO2: 22 mmol/L (ref 22–32)
Calcium: 8.6 mg/dL — ABNORMAL LOW (ref 8.9–10.3)
Chloride: 99 mmol/L (ref 98–111)
Creatinine, Ser: 0.83 mg/dL (ref 0.61–1.24)
GFR, Estimated: >60 mL/min (ref >60)
Glucose, Bld: 111 mg/dL — ABNORMAL HIGH (ref 70–99)
Potassium: 3.8 mmol/L (ref 3.5–5.1)
Sodium: 132 mmol/L — ABNORMAL LOW (ref 135–145)

## 2022-07-11 LAB — MAGNESIUM: Magnesium: 2 mg/dL (ref 1.7–2.4)

## 2022-07-11 LAB — T-HELPER CELLS (CD4) COUNT (NOT AT ARMC)
CD4 % Helper T Cell: 5 % — ABNORMAL LOW (ref 33–65)
CD4 T Cell Abs: 54 /uL — ABNORMAL LOW (ref 400–1790)

## 2022-07-11 LAB — BRAIN NATRIURETIC PEPTIDE: B Natriuretic Peptide: 70.9 pg/mL (ref 0.0–100.0)

## 2022-07-11 NOTE — Plan of Care (Signed)

## 2022-07-11 NOTE — Progress Notes (Signed)
Physical Therapy Treatment Patient Details Name: Jose Lamb MRN: [id number] DOB: [birth year] Today's Date: 07/11/2022   History of Present Illness Jose Lamb is a 62 y.o. male who presents with 6 weeks of left-sided weakness and frequent falls. MRI brain revealed concern for inflammatory or infectious process. PMHx: CVA, paroxysmal atrial fibrillation not on oral anticoagulation, asthma, HIV with noncompliance, history of pulmonary embolism.    PT Comments    Pt greeted supine in bed, sleeping but easily roused and agreeable to session with steady progress. Pt able to come to sitting EOB with cues for hand placement and mod A for LLE management and truncal assist. Pt with anterior flexed posture in sitting needing multimodal cues to correct, with pt unable to maintain consistently. Pt able to power up to standing x3 throughout session before max fatigue with mod down to min A with complete task practice. Pt continues to demonstrate L lateral lean in standing with increased fatigue with pt able to correct, but with increasing difficulty to maintain with increased fatigue. Current plan remains appropriate to address deficits and maximize functional independence and decrease caregiver burden. Pt continues to benefit from skilled PT services to progress toward functional mobility goals.     Recommendations for follow up therapy are one component of a multi-disciplinary discharge planning process, led by the attending physician.  Recommendations may be updated based on patient status, additional functional criteria and insurance authorization.  Follow Up Recommendations       Assistance Recommended at Discharge Frequent or constant Supervision/Assistance  Patient can return home with the following Two people to help with walking and/or transfers;A lot of help with bathing/dressing/bathroom;Assistance with cooking/housework;Assist for transportation;Help with stairs or ramp for entrance    Equipment Recommendations   (TBD at next venue)    Recommendations for Other Services       Precautions / Restrictions Precautions Precautions: Fall Precaution Comments: Lt hemi Restrictions Weight Bearing Restrictions: No     Mobility  Bed Mobility Overal bed mobility: Needs Assistance Bed Mobility: Supine to Sit, Sit to Supine     Supine to sit: Mod assist Sit to supine: Max assist   General bed mobility comments: mod A to manage LLE and scoot out to EOB, max A to return BLEs to bed at end of session    Transfers Overall transfer level: Needs assistance Equipment used: Rolling walker (2 wheels) Transfers: Sit to/from Stand Sit to Stand: Mod assist, Min assist           General transfer comment: mod A down to min A to power up to stand x3 throughout session, pt fatiguing quickly and with L lateral lean with increasing difficulty to correct with cues as fatigue increasing    Ambulation/Gait             Pre-gait activities: single leg marching with L x10, single leg marching with R x5 with max A to maintain upright and midline posture     Stairs             Wheelchair Mobility    Modified Rankin (Stroke Patients Only)       Balance Overall balance assessment: Needs assistance Sitting-balance support: Feet supported Sitting balance-Leahy Scale: Poor Sitting balance - Comments: leaning anterior this session needing cues for upright posture and forward gaze   Standing balance support: During functional activity, Bilateral upper extremity supported, Reliant on assistive device for balance Standing balance-Leahy Scale: Poor Standing balance comment: reliant on RW and external support of therapist  Cognition Arousal/Alertness: Awake/alert Behavior During Therapy: Flat affect Overall Cognitive Status: Within Functional Limits for tasks assessed                                           Exercises Other Exercises Other Exercises: standing single LE marching x10 on L, x5 on R Other Exercises: supine single leg raise x5 on L    General Comments General comments (skin integrity, edema, etc.): VSS on RA      Pertinent Vitals/Pain Pain Assessment Pain Assessment: Faces Faces Pain Scale: Hurts a little bit Pain Location: generalized with mobility Pain Descriptors / Indicators: Grimacing, Guarding Pain Intervention(s): Monitored during session, Limited activity within patient's tolerance    Home Living                          Prior Function            PT Goals (current goals can now be found in the care plan section) Acute Rehab PT Goals Patient Stated Goal: regain function and independence PT Goal Formulation: With patient Time For Goal Achievement: 07/23/22 Progress towards PT goals: Progressing toward goals    Frequency    Min 4X/week      PT Plan      Co-evaluation              AM-PAC PT "6 Clicks" Mobility   Outcome Measure  Help needed turning from your back to your side while in a flat bed without using bedrails?: A Little Help needed moving from lying on your back to sitting on the side of a flat bed without using bedrails?: A Lot Help needed moving to and from a bed to a chair (including a wheelchair)?: Total Help needed standing up from a chair using your arms (e.g., wheelchair or bedside chair)?: Total Help needed to walk in hospital room?: Total Help needed climbing 3-5 steps with a railing? : Total 6 Click Score: 9    End of Session   Activity Tolerance: Patient tolerated treatment well Patient left: with call bell/phone within reach;in bed Nurse Communication: Mobility status PT Visit Diagnosis: Unsteadiness on feet (R26.81);Other abnormalities of gait and mobility (R26.89);Ataxic gait (R26.0);Muscle weakness (generalized) (M62.81);Hemiplegia and hemiparesis;Other symptoms and signs involving the nervous system  (R29.898);Difficulty in walking, not elsewhere classified (R26.2) Hemiplegia - Right/Left: Left Hemiplegia - dominant/non-dominant: Non-dominant Hemiplegia - caused by: Unspecified     Time: 1610-9604 PT Time Calculation (min) (ACUTE ONLY): 26 min  Charges:  $Gait Training: 8-22 mins $Therapeutic Exercise: 8-22 mins                     Shakim Faith R. PTA Acute Rehabilitation Services Office: [phone]   Catalina Antigua 07/11/2022, 3:46 PM

## 2022-07-11 NOTE — Progress Notes (Addendum)
PROGRESS NOTE                                                                                                                                                                                                             Patient Demographics:    Jose Lamb, is a 62 y.o. male, DOB - 14-Oct-1960, ZOX:[id number]  Outpatient Primary MD for the patient is Jose Lamb    LOS - 4  Admit date - 07/07/2022    Chief Complaint  Patient presents with   Weakness       Brief Narrative (HPI from H&P)    62 y.o. male with medical history significant for prior CVA, paroxysmal atrial fibrillation not on oral anticoagulation, asthma, HIV with noncompliance, history of pulmonary embolism, who presents to [facility] ED with complaints of 6 weeks of left-sided weakness involving his left arm and left leg.  Associated with frequent falls.  Has not been taking his ART medications.  MRI of the brain showing right-sided T2 hypertense signal on the MRI primarily cortical/subcortical, in the right greater than left insula and postcentral gyrus, neurology and ID were consulted and he was admitted to the hospital for further care.   Subjective:    Jose Lamb today has, No headache, No chest pain, No abdominal pain - No Nausea, No new weakness tingling or numbness, no SOB, ++ L Leg weakness   Assessment  & Plan :    Left-sided weakness leg significantly more weak than the arm.  MRI of the brain suggestive of T2 hypertense signal primarily cortical/subcortical, in the right greater than left insula and postcentral gyrus, neurology and ID on board, he was noncompliant with his HIV medications.  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  Continue supportive care with PT  OT.   HIV.  Noncompliant with medications, counseled on compliance, ID on board defer management to ID.  He is now agreeable to taking his HIV medications as prescribed.  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  Per ID requested ENT input, per ENT CT guided biopsy by IR if required or else outpatient ENT follow-up.  Discussed with Dr. Murlean Hark  and ID physician Dr. Gwynn Burly.  He is s/p CT-guided biopsy of both parotid glands by IR on 07/10/2022, follow-up biopsy results from the parotid gland and CSF flow cytometry.  Dyslipidemia.  Placed on statin.  Asthma.  Stable no acute issues.  Obesity.  BMI 32.  Follow-up with PCP for weight loss.      Condition -   Guarded  Family Communication  :  None  Code Status :  Full  Consults  :  ID, Neuro, ENT  PUD Prophylaxis :    Procedures  :     MRI - 1. Abnormal T2 hyperintense signal, primarily cortical/subcortical, in the right greater than left insula and postcentral gyrus, as well as in the splenium and left body of the corpus callosum and possibly the right thalamus and midbrain. This is nonspecific, and while this could reflect sequela of prior infarcts, it is also concerning for an inflammatory or infectious process, such as PML, autoimmune encephalitis, or herpes simplex encephalitis, although this is not particularly typical of any the entities. MRI with contrast could be helpful. Correlate with symptoms and consider CSF testing. 2. Bilateral parotid masses are better seen on the prior CT and are incompletely imaged on this exam.      Disposition Plan  :      DVT Prophylaxis  :    heparin injection 5,000 Units Start: 07/08/22 1400    Lab Results  Component Value Date   PLT 97 (L) 07/11/2022    Diet :  Diet Order             Diet regular Room service appropriate? Yes; Fluid consistency: Thin  Diet effective now                    Inpatient Medications  Scheduled Meds:   bictegravir-emtricitabine-tenofovir AF  1 tablet Oral Daily   vitamin B-12  2,000 mcg Oral Daily   heparin  5,000 Units Subcutaneous Q8H   rosuvastatin  10 mg Oral Daily   sulfamethoxazole-trimethoprim  1 tablet Oral Daily   Continuous Infusions:  acyclovir 870 mg (07/11/22 0526)   lactated ringers 75 mL/hr at 07/11/22 0142   PRN Meds:.acetaminophen, albuterol, HYDROmorphone (DILAUDID) injection, mouth rinse, oxyCODONE, polyethylene glycol, prochlorperazine  Antibiotics  :    Anti-infectives (From admission, onward)    Start     Dose/Rate Route Frequency Ordered Stop   07/10/22 1615  bictegravir-emtricitabine-tenofovir AF (BIKTARVY) 50-200-25 MG per tablet 1 tablet        1 tablet Oral Daily 07/10/22 1515     07/10/22 1615  sulfamethoxazole-trimethoprim (BACTRIM DS) 800-160 MG per tablet 1 tablet        1 tablet Oral Daily 07/10/22 1522     07/08/22 2315  acyclovir (ZOVIRAX) 1,000 mg in dextrose 5 % 250 mL IVPB  Status:  Discontinued        1,000 mg 270 mL/hr over 60 Minutes Intravenous Every 8 hours 07/08/22 2221 07/08/22 2229   07/08/22 2315  acyclovir (ZOVIRAX) 870 mg in dextrose 5 % 250 mL IVPB        10 mg/kg  86.9 kg (Adjusted) 267.4 mL/hr over 60 Minutes Intravenous Every 8 hours 07/08/22 2229           Objective:   Vitals:   07/11/22 0000 07/11/22 0100 07/11/22 0137 07/11/22 0300  BP: 117/84  117/84 120/85  Pulse:   100 96  Resp: (!) 34 (!) 33 (!) 22 (!) 22  Temp: 98.9  F (37.2 C)  98.9 F (37.2 C) 98.9 F (37.2 C)  TempSrc:    Oral  SpO2:   95%   Weight:      Height:        Wt Readings from Last 3 Encounters:  07/07/22 104.3 kg  07/05/22 104.3 kg  04/08/22 106.7 kg     Intake/Output Summary (Last 24 hours) at 07/11/2022 0855 Last data filed at 07/11/2022 0142 Gross per 24 hour  Intake 892.54 ml  Output 1900 ml  Net -1007.46 ml     Physical Exam  Awake Alert, No new F.N deficits, L leg 2/5, L arm mildly weak [name],PERRAL Supple Neck, No JVD,    Symmetrical Chest wall movement, Good air movement bilaterally, CTAB RRR,No Gallops,Rubs or new Murmurs,  +ve B.Sounds, Abd Soft, No tenderness,   No Cyanosis, Clubbing or edema        Data Review:    Recent Labs  Lab 07/05/22 0919 07/07/22 1819 07/07/22 1841 07/08/22 0500 07/11/22 0216  WBC 6.0 4.1  --  3.9* 5.3  HGB 12.6* 13.1 12.9* 12.1* 12.5*  HCT 37.4* 39.9 38.0* 37.7* 37.7*  PLT 115* 112*  --  108* 97*  MCV 89.9 91.5  --  91.7 87.1  MCH 30.3 30.0  --  29.4 28.9  MCHC 33.7 32.8  --  32.1 33.2  RDW 14.2 14.4  --  14.4 13.6  LYMPHSABS 1.2 1.0  --   --  1.4  MONOABS 0.7 0.6  --   --  0.6  EOSABS 0.2 0.2  --   --  0.0  BASOSABS 0.0 0.0  --   --  0.0    Recent Labs  Lab 07/05/22 0919 07/05/22 0946 07/05/22 1141 07/07/22 1819 07/07/22 1841 07/08/22 0500 07/08/22 1405 07/09/22 0323 07/10/22 0546 07/11/22 0216  NA 136  --   --  135 140 138  --  136 134* 132*  K 3.2*  --   --  3.4* 3.6 3.2*  --  3.6 3.8 3.8  CL 102  --   --  102 101 105  --  104 102 99  CO2 25  --   --  25  --  28  --  24 26 22   ANIONGAP 9  --   --  8  --  5  --  8 6 11   GLUCOSE 97  --   --  95 89 104*  --  100* 99 111*  BUN 11  --   --  14 12 13   --  10 7 7   CREATININE 0.76  --   --  0.78 0.70 0.79  --  0.77 0.75 0.83  AST 16  --   --  21  --   --   --   --   --   --   ALT 19  --   --  24  --   --   --   --   --   --   ALKPHOS 66  --   --  67  --   --   --   --   --   --   BILITOT 0.9  --   --  0.6  --   --   --   --   --   --   ALBUMIN 3.3*  --   --  3.6  --   --  NOT PERFORMED  --   --   --  LATICACIDVEN  --  0.7 0.7  --   --   --   --   --   --   --   INR 1.1  --   --  1.0  --   --   --   --  1.1  --   BNP  --   --   --   --   --   --   --   --   --  70.9  MG  --   --   --   --   --  2.3  --   --   --  2.0  CALCIUM 8.5*  --   --  8.4*  --  8.6*  --  8.3* 8.6* 8.6*      Recent Labs  Lab 07/05/22 0919 07/05/22 0946 07/05/22 1141 07/07/22 1819 07/08/22 0500 07/09/22 0323  07/10/22 0546 07/11/22 0216  LATICACIDVEN  --  0.7 0.7  --   --   --   --   --   INR 1.1  --   --  1.0  --   --  1.1  --   BNP  --   --   --   --   --   --   --  70.9  MG  --   --   --   --  2.3  --   --  2.0  CALCIUM 8.5*  --   --  8.4* 8.6* 8.3* 8.6* 8.6*     Lab Results  Component Value Date   CHOL 197 04/10/2022   HDL 39 (L) 04/10/2022   LDLCALC 126 (H) 04/10/2022   TRIG 158 (H) 04/10/2022   CHOLHDL 5.1 04/10/2022    Lab Results  Component Value Date   HGBA1C 6.6 (H) 04/10/2022      Micro Results Recent Results (from the past 240 hour(s))  Urine Culture     Status: Abnormal   Collection Time: 07/05/22  9:19 AM   Specimen: Urine, Clean Catch  Result Value Ref Range Status   Specimen Description   Final    URINE, CLEAN CATCH Performed at [facility], [address], [place], [place] [address]    Special Requests   Final    NONE Reflexed from [phone] Performed at [facility], [address], [place], [place] [address]    Culture MULTIPLE SPECIES PRESENT, SUGGEST RECOLLECTION (A)  Final   Report Status 07/06/2022 FINAL  Final  Blood Culture (routine x 2)     Status: None   Collection Time: 07/05/22  9:46 AM   Specimen: Left Antecubital; Blood  Result Value Ref Range Status   Specimen Description LEFT ANTECUBITAL  Final   Special Requests   Final    BOTTLES DRAWN AEROBIC AND ANAEROBIC Blood Culture results may not be optimal due to an excessive volume of blood received in culture bottles   Culture   Final    NO GROWTH 6 DAYS Performed at [facility] [place] [facility], [address] [place], [place] [address]    Report Status 07/11/2022 FINAL  Final  Blood Culture (routine x 2)     Status: None   Collection Time: 07/05/22  9:46 AM   Specimen: BLOOD RIGHT ARM  Result Value Ref Range Status   Specimen Description BLOOD RIGHT ARM  Final   Special Requests   Final    BOTTLES DRAWN AEROBIC AND ANAEROBIC Blood Culture results may not be optimal due to an excessive  volume of  blood received in culture bottles   Culture   Final    NO GROWTH 6 DAYS Performed at [facility], [address] [place], [place] [address]    Report Status 07/11/2022 FINAL  Final  Resp panel by RT-PCR (RSV, Flu A&B, Covid) Anterior Nasal Swab     Status: None   Collection Time: 07/05/22  9:47 AM   Specimen: Anterior Nasal Swab  Result Value Ref Range Status   SARS Coronavirus 2 by RT PCR NEGATIVE NEGATIVE Final    Comment: (NOTE) SARS-CoV-2 target nucleic acids are NOT DETECTED.  The SARS-CoV-2 RNA is generally detectable in upper respiratory specimens during the acute phase of infection. The lowest concentration of SARS-CoV-2 viral copies this assay can detect is 138 copies/mL. A negative result does not preclude SARS-Cov-2 infection and should not be used as the sole basis for treatment or other patient management decisions. A negative result may occur with  improper specimen collection/handling, submission of specimen other than nasopharyngeal swab, presence of viral mutation(s) within the areas targeted by this assay, and inadequate number of viral copies(<138 copies/mL). A negative result must be combined with clinical observations, patient history, and epidemiological information. The expected result is Negative.  Fact Sheet for Patients:  BloggerCourse.com  Fact Sheet for Healthcare Providers:  SeriousBroker.it  This test is no t yet approved or cleared by the [place] FDA and  has been authorized for detection and/or diagnosis of SARS-CoV-2 by FDA under an Emergency Use Authorization (EUA). This EUA will remain  in effect (meaning this test can be used) for the duration of the COVID-19 declaration under Section 564(b)(1) of the Act, 21 U.S.C.section 360bbb-3(b)(1), unless the authorization is terminated  or revoked sooner.       Influenza A by PCR NEGATIVE NEGATIVE Final   Influenza B by PCR NEGATIVE NEGATIVE  Final    Comment: (NOTE) The Xpert Xpress SARS-CoV-2/FLU/RSV plus assay is intended as an aid in the diagnosis of influenza from Nasopharyngeal swab specimens and should not be used as a sole basis for treatment. Nasal washings and aspirates are unacceptable for Xpert Xpress SARS-CoV-2/FLU/RSV testing.  Fact Sheet for Patients: BloggerCourse.com  Fact Sheet for Healthcare Providers: SeriousBroker.it  This test is not yet approved or cleared by the [place] FDA and has been authorized for detection and/or diagnosis of SARS-CoV-2 by FDA under an Emergency Use Authorization (EUA). This EUA will remain in effect (meaning this test can be used) for the duration of the COVID-19 declaration under Section 564(b)(1) of the Act, 21 U.S.C. section 360bbb-3(b)(1), unless the authorization is terminated or revoked.     Resp Syncytial Virus by PCR NEGATIVE NEGATIVE Final    Comment: (NOTE) Fact Sheet for Patients: BloggerCourse.com  Fact Sheet for Healthcare Providers: SeriousBroker.it  This test is not yet approved or cleared by the [place] FDA and has been authorized for detection and/or diagnosis of SARS-CoV-2 by FDA under an Emergency Use Authorization (EUA). This EUA will remain in effect (meaning this test can be used) for the duration of the COVID-19 declaration under Section 564(b)(1) of the Act, 21 U.S.C. section 360bbb-3(b)(1), unless the authorization is terminated or revoked.  Performed at [facility], [address], [place], [place] [address]   Urine Culture     Status: Abnormal   Collection Time: 07/07/22  7:21 PM   Specimen: Urine, Clean Catch  Result Value Ref Range Status   Specimen Description   Final    URINE, CLEAN  CATCH Performed at [facility], [address] [place], [place] [address]    Special Requests   Final     NONE Performed at [facility], [address], [place], [place] [address]    Culture (A)  Final    <10,000 COLONIES/mL INSIGNIFICANT GROWTH Performed at [facility], [address], [place], [place] [address]    Report Status 07/08/2022 FINAL  Final  CSF culture w Gram Stain     Status: None (Preliminary result)   Collection Time: 07/08/22 12:30 PM   Specimen: CSF; Cerebrospinal Fluid  Result Value Ref Range Status   Specimen Description   Final    CSF Performed at [facility], [address] [place], [place] [address]    Special Requests   Final    NONE Performed at [facility], [address] [place], [place] [address]    Gram Stain   Final    WBC PRESENT,BOTH PMN AND MONONUCLEAR NO ORGANISMS SEEN CYTOSPIN Gram Stain Report Called to,Read Back By and Verified With: Derl Barrow RN @1425  ON 6.1.2024 BY [facility] Performed at [facility], [address], [place], [place] [address]    Culture   Final    NO GROWTH 2 DAYS Performed at [facility], [address], [place], [place] [address]    Report Status PENDING  Incomplete    Radiology Reports DG Lumbar Puncture Fluoro Guide  Result Date: 07/09/2022 CLINICAL DATA:  Left-sided weakness with concern for encephalitis. Request for image guided lumbar puncture. EXAM: LUMBAR PUNCTURE UNDER FLUOROSCOPY PROCEDURE: An appropriate skin entry site was determined fluoroscopically. Operator donned sterile gloves and mask. Skin site was marked, then prepped with Betadine, draped in usual sterile fashion, and infiltrated locally with 1% lidocaine. A 6 inch, 20 gauge spinal needle advanced into the thecal sac at L3-4 from a right interlaminar approach. Clear colorless CSF spontaneously returned, with opening pressure of 25 cm water. Approximately 13 ml CSF were collected and divided among 4 sterile vials for the requested laboratory studies. The needle was  then removed. The patient tolerated the procedure well and there were no complications. FLUOROSCOPY: Radiation Exposure Index (as provided by the fluoroscopic device): 6.5 mGy Kerma IMPRESSION: Technically successful lumbar puncture under fluoroscopy. This exam was performed by Corrin Parker, [name], and was supervised and interpreted by Dr. Marliss Coots. Electronically Signed   By: Marliss Coots M.D.   On: 07/09/2022 18:03   MR BRAIN W CONTRAST  Result Date: 07/08/2022 CLINICAL DATA:  Headache, no red flags EXAM: MRI HEAD WITH CONTRAST TECHNIQUE: Multiplanar, multiecho pulse sequences of the brain and surrounding structures were obtained with intravenous contrast. CONTRAST:  10mL GADAVIST GADOBUTROL 1 MMOL/ML IV SOLN COMPARISON:  MRI head 07/07/2022 FINDINGS: Axial T1 pre and postcontrast, coronal T1 postcontrast, and coronal T2 sequences obtained. No abnormal parenchymal or meningeal enhancement. Redemonstrated areas of T1 hyperintense signal in the right greater than left insula and frontoparietal region. IMPRESSION: No abnormal parenchymal or meningeal enhancement. Electronically Signed   By: Wiliam Ke M.D.   On: 07/08/2022 20:32   MR BRAIN WO CONTRAST  Result Date: 07/07/2022 CLINICAL DATA:  Transient ischemic attack EXAM: MRI HEAD WITHOUT CONTRAST TECHNIQUE: Multiplanar, multiecho pulse sequences of the brain and surrounding structures were obtained without intravenous contrast. COMPARISON:  None Available. FINDINGS: Brain: Increased T2 hyperintense signal along the right-greater-than-left insula (series 9, image 27), which is associated with mildly increased signal on  diffusion-weighted imaging, with decreased intensity on the ADC map. On the right, this is associated with a more focal area of T1 and T2 hyperintense signal, with surrounding edema (series 8, image 18, series 9, image 35 and series 12, image 111). T2 hyperintense signal is also noted in right thalamus, extending into the midbrain (series  4, images 24-25), although this could be related to wallerian degeneration. Similar abnormal cortical signal is noted in the right postcentral gyrus (series 9, image 46), which is associated with a similar T2 hyperintense, T1 hypointense area with peripheral edema in the right frontoparietal region (series 9, image 39). Less significant T2 hyperintense signal is noted in the left postcentral gyrus (series 9, image 36), without the more superior cortical involvement. Increased T2 signal in the splenium of the corpus callosum (series 9, image 31) and in the left body (series 9, image 35), which is not definitively associated with restricted diffusion. No acute hemorrhage, mass, mass effect, or midline shift. No hydrocephalus or extra-axial collection. Partial empty sella. Craniocervical junction. No hemosiderin deposition to suggest remote hemorrhage. Vascular: Normal arterial flow voids. Skull and upper cervical spine: Normal marrow signal. Sinuses/Orbits: Mucosal thickening left maxillary sinus. No acute finding in the orbits. Other: The mastoid air cells are well aerated. Bilateral parotid masses are better seen on the prior CT and are incompletely imaged on this exam IMPRESSION: 1. Abnormal T2 hyperintense signal, primarily cortical/subcortical, in the right greater than left insula and postcentral gyrus, as well as in the splenium and left body of the corpus callosum and possibly the right thalamus and midbrain. This is nonspecific, and while this could reflect sequela of prior infarcts, it is also concerning for an inflammatory or infectious process, such as PML, autoimmune encephalitis, or herpes simplex encephalitis, although this is not particularly typical of any the entities. MRI with contrast could be helpful. Correlate with symptoms and consider CSF testing. 2. Bilateral parotid masses are better seen on the prior CT and are incompletely imaged on this exam. These results were called by telephone at the  time of interpretation on 07/07/2022 at 10:30 pm to provider [facility] , who verbally acknowledged these results. Electronically Signed   By: Wiliam Ke M.D.   On: 07/07/2022 22:39      Signature  -   Susa Raring M.D on 07/11/2022 at 8:55 AM   -  To page go to [email or web address]

## 2022-07-11 NOTE — Progress Notes (Signed)
Pharmacy Antibiotic Note  Jose Lamb is a 62 y.o. male admitted on 07/07/2022 with weakness. 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   Plan: - Continue Acylcovir 870 mg (10 mg/kg AdjBW) IV every 8 hours - Continue LR at 50 cc/hr for MVIF  - Will continue to follow renal function, culture results, and LOT  Height: 5\' 11"  (180.3 cm) Weight: 104.3 kg (230 lb) IBW/kg (Calculated) : 75.3  Temp (24hrs), Avg:99.3 F (37.4 C), Min:98.9 F (37.2 C), Max:100.1 F (37.8 C)  Recent Labs  Lab 07/05/22 0919 07/05/22 0946 07/05/22 1141 07/07/22 1819 07/07/22 1841 07/08/22 0500 07/09/22 0323 07/10/22 0546 07/11/22 0216  WBC 6.0  --   --  4.1  --  3.9*  --   --  5.3  CREATININE 0.76  --   --  0.78 0.70 0.79 0.77 0.75 0.83  LATICACIDVEN  --  0.7 0.7  --   --   --   --   --   --      Estimated Creatinine Clearance: 116.3 mL/min (by C-G formula based on SCr of 0.83 mg/dL).    Allergies  Allergen Reactions   Asa [Aspirin] Other (See Comments)    Nose bleeds    Thank you for allowing pharmacy to be a part of this patient's care.  Georgina Pillion, PharmD, BCPS Infectious Diseases Clinical Pharmacist 07/11/2022 8:24 AM   **Pharmacist phone directory can now be found on [email or web address] ([email or web address]).  Listed under [facility].

## 2022-07-11 NOTE — Progress Notes (Signed)
Subjective:  No new complaints   Antibiotics:  Anti-infectives (From admission, onward)    Start     Dose/Rate Route Frequency Ordered Stop   07/10/22 1615  bictegravir-emtricitabine-tenofovir AF (BIKTARVY) 50-200-25 MG per tablet 1 tablet        1 tablet Oral Daily 07/10/22 1515     07/10/22 1615  sulfamethoxazole-trimethoprim (BACTRIM DS) 800-160 MG per tablet 1 tablet        1 tablet Oral Daily 07/10/22 1522     07/08/22 2315  acyclovir (ZOVIRAX) 1,000 mg in dextrose 5 % 250 mL IVPB  Status:  Discontinued        1,000 mg 270 mL/hr over 60 Minutes Intravenous Every 8 hours 07/08/22 2221 07/08/22 2229   07/08/22 2315  acyclovir (ZOVIRAX) 870 mg in dextrose 5 % 250 mL IVPB        10 mg/kg  86.9 kg (Adjusted) 267.4 mL/hr over 60 Minutes Intravenous Every 8 hours 07/08/22 2229         Medications: Scheduled Meds:  bictegravir-emtricitabine-tenofovir AF  1 tablet Oral Daily   vitamin B-12  2,000 mcg Oral Daily   heparin  5,000 Units Subcutaneous Q8H   rosuvastatin  10 mg Oral Daily   sulfamethoxazole-trimethoprim  1 tablet Oral Daily   Continuous Infusions:  acyclovir 870 mg (07/11/22 0526)   lactated ringers 75 mL/hr at 07/11/22 0142   PRN Meds:.acetaminophen, albuterol, HYDROmorphone (DILAUDID) injection, mouth rinse, oxyCODONE, polyethylene glycol, prochlorperazine    Objective: Weight change:   Intake/Output Summary (Last 24 hours) at 07/11/2022 1330 Last data filed at 07/11/2022 0142 Gross per 24 hour  Intake 892.54 ml  Output 1300 ml  Net -407.46 ml    Blood pressure (!) 117/92, pulse 96, temperature 98.9 F (37.2 C), temperature source Oral, resp. rate 20, height 5\' 11"  (1.803 m), weight 104.3 kg, SpO2 95 %. Temp:  [98.9 F (37.2 C)-99.8 F (37.7 C)] 98.9 F (37.2 C) (06/04 1146) Pulse Rate:  [96-100] 96 (06/04 0300) Resp:  [13-36] 20 (06/04 1146) BP: (110-146)/(69-93) 117/92 (06/04 1146) SpO2:  [95 %] 95 % (06/04 0137)  Physical  Exam: Physical Exam Constitutional:      Appearance: He is obese.  Eyes:     Extraocular Movements: Extraocular movements intact.  Cardiovascular:     Rate and Rhythm: Normal rate and regular rhythm.  Pulmonary:     Effort: Pulmonary effort is normal. No respiratory distress.  Neurological:     Mental Status: He is alert and oriented to person, place, and time.     Comments: Significant left sided weakness      CBC:    BMET Recent Labs    07/10/22 0546 07/11/22 0216  NA 134* 132*  K 3.8 3.8  CL 102 99  CO2 26 22  GLUCOSE 99 111*  BUN 7 7  CREATININE 0.75 0.83  CALCIUM 8.6* 8.6*      Liver Panel  Recent Labs    07/08/22 1405  ALBUMIN NOT PERFORMED        Sedimentation Rate No results for input(s): "ESRSEDRATE" in the last 72 hours. C-Reactive Protein No results for input(s): "CRP" in the last 72 hours.  Micro Results: Recent Results (from the past 720 hour(s))  Urine Culture     Status: Abnormal   Collection Time: 07/05/22  9:19 AM   Specimen: Urine, Clean Catch  Result Value Ref Range Status   Specimen Description   Final    URINE,  CLEAN CATCH Performed at [facility], [address], [place], [place] [address]    Special Requests   Final    NONE Reflexed from [phone] Performed at [facility], [address] [place], [place] [address]    Culture MULTIPLE SPECIES PRESENT, SUGGEST RECOLLECTION (A)  Final   Report Status 07/06/2022 FINAL  Final  Blood Culture (routine x 2)     Status: None   Collection Time: 07/05/22  9:46 AM   Specimen: Left Antecubital; Blood  Result Value Ref Range Status   Specimen Description LEFT ANTECUBITAL  Final   Special Requests   Final    BOTTLES DRAWN AEROBIC AND ANAEROBIC Blood Culture results may not be optimal due to an excessive volume of blood received in culture bottles   Culture   Final    NO GROWTH 6 DAYS Performed at [facility], [address], [place], [place] [address]    Report Status  07/11/2022 FINAL  Final  Blood Culture (routine x 2)     Status: None   Collection Time: 07/05/22  9:46 AM   Specimen: BLOOD RIGHT ARM  Result Value Ref Range Status   Specimen Description BLOOD RIGHT ARM  Final   Special Requests   Final    BOTTLES DRAWN AEROBIC AND ANAEROBIC Blood Culture results may not be optimal due to an excessive volume of blood received in culture bottles   Culture   Final    NO GROWTH 6 DAYS Performed at [facility], [address] [place], [place] [address]    Report Status 07/11/2022 FINAL  Final  Resp panel by RT-PCR (RSV, Flu A&B, Covid) Anterior Nasal Swab     Status: None   Collection Time: 07/05/22  9:47 AM   Specimen: Anterior Nasal Swab  Result Value Ref Range Status   SARS Coronavirus 2 by RT PCR NEGATIVE NEGATIVE Final    Comment: (NOTE) SARS-CoV-2 target nucleic acids are NOT DETECTED.  The SARS-CoV-2 RNA is generally detectable in upper respiratory specimens during the acute phase of infection. The lowest concentration of SARS-CoV-2 viral copies this assay can detect is 138 copies/mL. A negative result does not preclude SARS-Cov-2 infection and should not be used as the sole basis for treatment or other patient management decisions. A negative result may occur with  improper specimen collection/handling, submission of specimen other than nasopharyngeal swab, presence of viral mutation(s) within the areas targeted by this assay, and inadequate number of viral copies(<138 copies/mL). A negative result must be combined with clinical observations, patient history, and epidemiological information. The expected result is Negative.  Fact Sheet for Patients:  BloggerCourse.com  Fact Sheet for Healthcare Providers:  SeriousBroker.it  This test is no t yet approved or cleared by the [place] FDA and  has been authorized for detection and/or diagnosis of SARS-CoV-2 by FDA under an Emergency  Use Authorization (EUA). This EUA will remain  in effect (meaning this test can be used) for the duration of the COVID-19 declaration under Section 564(b)(1) of the Act, 21 U.S.C.section 360bbb-3(b)(1), unless the authorization is terminated  or revoked sooner.       Influenza A by PCR NEGATIVE NEGATIVE Final   Influenza B by PCR NEGATIVE NEGATIVE Final    Comment: (NOTE) The Xpert Xpress SARS-CoV-2/FLU/RSV plus assay is intended as an aid in the diagnosis of influenza from Nasopharyngeal swab specimens and should not be used as a sole basis for treatment. Nasal washings and aspirates are unacceptable for Xpert Xpress  SARS-CoV-2/FLU/RSV testing.  Fact Sheet for Patients: BloggerCourse.com  Fact Sheet for Healthcare Providers: SeriousBroker.it  This test is not yet approved or cleared by the [place] FDA and has been authorized for detection and/or diagnosis of SARS-CoV-2 by FDA under an Emergency Use Authorization (EUA). This EUA will remain in effect (meaning this test can be used) for the duration of the COVID-19 declaration under Section 564(b)(1) of the Act, 21 U.S.C. section 360bbb-3(b)(1), unless the authorization is terminated or revoked.     Resp Syncytial Virus by PCR NEGATIVE NEGATIVE Final    Comment: (NOTE) Fact Sheet for Patients: BloggerCourse.com  Fact Sheet for Healthcare Providers: SeriousBroker.it  This test is not yet approved or cleared by the [place] FDA and has been authorized for detection and/or diagnosis of SARS-CoV-2 by FDA under an Emergency Use Authorization (EUA). This EUA will remain in effect (meaning this test can be used) for the duration of the COVID-19 declaration under Section 564(b)(1) of the Act, 21 U.S.C. section 360bbb-3(b)(1), unless the authorization is terminated or revoked.  Performed at [facility], [address], [place], [place] [address]   Urine Culture     Status: Abnormal   Collection Time: 07/07/22  7:21 PM   Specimen: Urine, Clean Catch  Result Value Ref Range Status   Specimen Description   Final    URINE, CLEAN CATCH Performed at [facility], [address], [place], [place] [address]    Special Requests   Final    NONE Performed at [facility], [address] [place], [place] [address]    Culture (A)  Final    <10,000 COLONIES/mL INSIGNIFICANT GROWTH Performed at [facility], [address], [place], [place] [address]    Report Status 07/08/2022 FINAL  Final  CSF culture w Gram Stain     Status: None (Preliminary result)   Collection Time: 07/08/22 12:30 PM   Specimen: CSF; Cerebrospinal Fluid  Result Value Ref Range Status   Specimen Description   Final    CSF Performed at [facility], [address] [place], [place] [address]    Special Requests   Final    NONE Performed at [facility], [address], [place], [place] [address]    Gram Stain   Final    WBC PRESENT,BOTH PMN AND MONONUCLEAR NO ORGANISMS SEEN CYTOSPIN Gram Stain Report Called to,Read Back By and Verified With: Derl Barrow RN @1425  ON 6.1.2024 BY [facility] Performed at [facility], [address] [place], [place] [address]    Culture   Final    NO GROWTH 3 DAYS Performed at [facility], [address] [place], [place] [address]    Report Status PENDING  Incomplete    Studies/Results: [place] FNA SALIVARY GLAND/PAROTID GLAND  Result Date: 07/11/2022 INDICATION: Indeterminate bilateral parotid cysts. Please perform ultrasound-guided aspiration for tissue diagnostic purposes. EXAM: 1. ULTRASOUND-GUIDED FINE-NEEDLE ASPIRATION OF RIGHT PAROTID CYST 2. ULTRASOUND-GUIDED FINE-NEEDLE ASPIRATION OF THE LEFT PAROTID CYST COMPARISON:  Brain MRI-06/27/2022; cervical spine CT-01/30/2021 MEDICATIONS: None  ANESTHESIA/SEDATION: None CONTRAST:  None COMPLICATIONS: None immediate. PROCEDURE: Informed written consent was obtained from the patient after a discussion of the risks, benefits and alternatives to treatment. Preprocedural ultrasound scanning demonstrated an approximately 4.4 x 2.6 cm minimally complex right-sided parotid cyst (image 4), as well as an approximately 3.3 x 2.0 cm left-sided parotid cyst (image 8). A timeout was performed prior to the initiation of  the procedure. The skin overlying the operative sites were prepped and draped in usual sterile fashion. Attention was first paid towards aspiration of the right-sided parotid cyst. After the overlying soft tissues were anesthetized with 1% lidocaine with epinephrine, an 18 gauge trocar needle was advanced into the cyst. Multiple ultrasound images were saved procedural documentation purposes. Next, a proximally 25 cc of tan colored fluid was aspirated from the right parotid cyst. Postprocedural imaging was obtained demonstrating near complete resolution of the right-sided parotid cyst. The identical procedure was then performed left-sided parotid cyst yielding 10 cc of similar appearing tan colored fluid. Postprocedural imaging was obtained demonstrating near complete resolution of the left-sided parotid cyst. All aspirated fluid was capped and sent separately to the laboratory for analysis. Dressings were applied. The patient tolerated the procedure well without immediate postprocedural complication. IMPRESSION: 1. Successful ultrasound-guided aspiration of 25 cc of tan colored fluid from the right-sided parotid cyst. 2. Successful ultrasound-guided aspiration of 10 cc of tan colored fluid from the left-sided parotid cyst. 3. All aspirated fluid was capped and sent separately to the laboratory for analysis. Electronically Signed   By: Simonne Come M.D.   On: 07/11/2022 10:36   [place] FNA BIOPSY SALIVARY GLAND PAROTID GLAND EA ADDT'L LESION  Result Date:  07/11/2022 INDICATION: Indeterminate bilateral parotid cysts. Please perform ultrasound-guided aspiration for tissue diagnostic purposes. EXAM: 1. ULTRASOUND-GUIDED FINE-NEEDLE ASPIRATION OF RIGHT PAROTID CYST 2. ULTRASOUND-GUIDED FINE-NEEDLE ASPIRATION OF THE LEFT PAROTID CYST COMPARISON:  Brain MRI-06/27/2022; cervical spine CT-01/30/2021 MEDICATIONS: None ANESTHESIA/SEDATION: None CONTRAST:  None COMPLICATIONS: None immediate. PROCEDURE: Informed written consent was obtained from the patient after a discussion of the risks, benefits and alternatives to treatment. Preprocedural ultrasound scanning demonstrated an approximately 4.4 x 2.6 cm minimally complex right-sided parotid cyst (image 4), as well as an approximately 3.3 x 2.0 cm left-sided parotid cyst (image 8). A timeout was performed prior to the initiation of the procedure. The skin overlying the operative sites were prepped and draped in usual sterile fashion. Attention was first paid towards aspiration of the right-sided parotid cyst. After the overlying soft tissues were anesthetized with 1% lidocaine with epinephrine, an 18 gauge trocar needle was advanced into the cyst. Multiple ultrasound images were saved procedural documentation purposes. Next, a proximally 25 cc of tan colored fluid was aspirated from the right parotid cyst. Postprocedural imaging was obtained demonstrating near complete resolution of the right-sided parotid cyst. The identical procedure was then performed left-sided parotid cyst yielding 10 cc of similar appearing tan colored fluid. Postprocedural imaging was obtained demonstrating near complete resolution of the left-sided parotid cyst. All aspirated fluid was capped and sent separately to the laboratory for analysis. Dressings were applied. The patient tolerated the procedure well without immediate postprocedural complication. IMPRESSION: 1. Successful ultrasound-guided aspiration of 25 cc of tan colored fluid from the  right-sided parotid cyst. 2. Successful ultrasound-guided aspiration of 10 cc of tan colored fluid from the left-sided parotid cyst. 3. All aspirated fluid was capped and sent separately to the laboratory for analysis. Electronically Signed   By: Simonne Come M.D.   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  He underwent lumbar puncture which showed lymphocytic pleocytosis with a white count of 105 glucose of 48 protein of 70.  Meningitis encephalitis panel positive for HSV-2. He also has bilateral parotid gland masses and is now sp IR guided aspirate  #1 Left sided weakness appears due to PML in context of uncontrolled HIV  We have started Biktarvy and Bactrim  His wife who was at the bedside discussions states that she tested negative for HIV in the past but does not been sexually active with her husband for roughly 10 years.  She agreed to get tested when she is outside the hospital.  #2 HSV2 :unlikely contributing to his CNS pathology but will treat with acyclovir  3.  Positive RPR titer is low at 1-1 we will follow-up tPA negative = false +  Parotid gland swelling: followup on biopsy results   4 Disp: it sounds that the burden of his CNS pathology in disability that he really is going to need to be placed in a skilled nursing facility at least for the short-term if not potentially long-term.  I have personally spent 52 minutes involved in face-to-face and non-face-to-face  activities for this patient on the day of the visit. Professional time spent includes the following activities: Preparing to see the patient (review of tests), Obtaining and/or reviewing separately obtained history (admission/discharge record), Performing a medically appropriate examination and/or evaluation , Ordering medications/tests/procedures, referring and communicating with other health care professionals, Documenting clinical information in the EMR, Independently interpreting results (not separately reported), Communicating results to the patient/family/caregiver, Counseling and educating the patient/family/caregiver and Care coordination (not separately reported).        LOS: 4 days   Acey Lav 07/11/2022, 1:30 PM

## 2022-07-11 NOTE — Progress Notes (Signed)
Neurology Progress Note   S:// Admitted 5/31 for left-sided weakness. MRI neg for stroke. LP showed HSV in CSF. ID on board.  Patient sitting up in bed, no acute distress. Improved left-side movement.    O:// Current vital signs: BP 120/85 (BP Location: Right Arm)   Pulse 96   Temp 98.9 F (37.2 C) (Oral)   Resp (!) 22   Ht 5\' 11"  (1.803 m)   Wt 104.3 kg   SpO2 95%   BMI 32.08 kg/m  Vital signs in last 24 hours: Temp:  [98.9 F (37.2 C)-101.2 F (38.4 C)] 98.9 F (37.2 C) (06/04 0300) Pulse Rate:  [92-100] 96 (06/04 0300) Resp:  [19-36] 22 (06/04 0300) BP: (110-146)/(69-103) 120/85 (06/04 0300) SpO2:  [95 %] 95 % (06/04 0137)  GENERAL: Awake, alert in NAD  HEENT: - Normocephalic and atraumatic LUNGS - Respirations unlabored Ext: warm, well perfused   NEURO:  Mental Status: Alert and oriented x 5. Speech is clear and fluent with intact comprehension.  Cranial Nerves: PERRL EOMI, visual fields full, no facial asymmetry, facial sensation intact, hearing intact, no lingual dysarthria Motor: [name] 4/5, grip  4/5; LLE 3/5 RUE 5/5, RLE 5/5 Tone is decreased on the left. Bulk is normal Sensation- Decreased on left side, worse on left leg Coordination: FNF intact bilaterally Gait- Unable to assess  Medications  Current Facility-Administered Medications:    acetaminophen (TYLENOL) tablet 650 mg, 650 mg, Oral, Q6H PRN, Darlin Drop, DO, 650 mg at 07/09/22 2001   acyclovir (ZOVIRAX) 870 mg in dextrose 5 % 250 mL IVPB, 10 mg/kg (Adjusted), Intravenous, Q8H, Leroy Sea, MD, Last Rate: 267.4 mL/hr at 07/11/22 0526, 870 mg at 07/11/22 0526   albuterol (PROVENTIL) (2.5 MG/3ML) 0.083% nebulizer solution 2.5 mg, 2.5 mg, Nebulization, Q4H PRN, Hall, Carole N, DO   bictegravir-emtricitabine-tenofovir AF (BIKTARVY) 50-200-25 MG per tablet 1 tablet, 1 tablet, Oral, Daily, Daiva Eves, Lisette Grinder, MD, 1 tablet at 07/10/22 1715   cyanocobalamin (VITAMIN B12) tablet 2,000 mcg, 2,000  mcg, Oral, Daily, Caryl Pina, MD, 2,000 mcg at 07/10/22 1009   heparin injection 5,000 Units, 5,000 Units, Subcutaneous, Q8H, Hall, Carole N, DO, 5,000 Units at 07/11/22 0528   HYDROmorphone (DILAUDID) injection 0.5 mg, 0.5 mg, Intravenous, Q4H PRN, Margo Aye, Carole N, DO, 0.5 mg at 07/11/22 0310   lactated ringers infusion, , Intravenous, Continuous, Stevphen Rochester, RPH, Last Rate: 75 mL/hr at 07/11/22 0142, Infusion Verify at 07/11/22 0142   Oral care mouth rinse, 15 mL, Mouth Rinse, PRN, Dow Adolph N, DO   oxyCODONE (Oxy IR/ROXICODONE) immediate release tablet 5 mg, 5 mg, Oral, Q6H PRN, Dow Adolph N, DO, 5 mg at 07/09/22 1745   polyethylene glycol (MIRALAX / GLYCOLAX) packet 17 g, 17 g, Oral, Daily PRN, Margo Aye, Carole N, DO   prochlorperazine (COMPAZINE) injection 5 mg, 5 mg, Intravenous, Q6H PRN, Hall, Carole N, DO   rosuvastatin (CRESTOR) tablet 10 mg, 10 mg, Oral, Daily, Susa Raring K, MD, 10 mg at 07/10/22 1009   sulfamethoxazole-trimethoprim (BACTRIM DS) 800-160 MG per tablet 1 tablet, 1 tablet, Oral, Daily, Daiva Eves, Lisette Grinder, MD, 1 tablet at 07/10/22 1715 Labs CBC    Component Value Date/Time   WBC 5.3 07/11/2022 0216   RBC 4.33 07/11/2022 0216   HGB 12.5 (L) 07/11/2022 0216   HCT 37.7 (L) 07/11/2022 0216   PLT 97 (L) 07/11/2022 0216   MCV 87.1 07/11/2022 0216   MCH 28.9 07/11/2022 0216   MCHC 33.2 07/11/2022 0216  RDW 13.6 07/11/2022 0216   LYMPHSABS 1.4 07/11/2022 0216   MONOABS 0.6 07/11/2022 0216   EOSABS 0.0 07/11/2022 0216   BASOSABS 0.0 07/11/2022 0216    CMP     Component Value Date/Time   NA 132 (L) 07/11/2022 0216   K 3.8 07/11/2022 0216   CL 99 07/11/2022 0216   CO2 22 07/11/2022 0216   GLUCOSE 111 (H) 07/11/2022 0216   BUN 7 07/11/2022 0216   CREATININE 0.83 07/11/2022 0216   CALCIUM 8.6 (L) 07/11/2022 0216   PROT 7.7 07/07/2022 1819   ALBUMIN NOT PERFORMED 07/08/2022 1405   AST 21 07/07/2022 1819   ALT 24 07/07/2022 1819   ALKPHOS 67  07/07/2022 1819   BILITOT 0.6 07/07/2022 1819   GFRNONAA >60 07/11/2022 0216   GFRAA >60 09/19/2019 0657     Lipid Panel     Component Value Date/Time   CHOL 197 04/10/2022 1130   TRIG 158 (H) 04/10/2022 1130   HDL 39 (L) 04/10/2022 1130   CHOLHDL 5.1 04/10/2022 1130   VLDL 32 04/10/2022 1130   LDLCALC 126 (H) 04/10/2022 1130   Labs  Platelets: 97 WBC: 5.3  T-helper cells: pending  LP results: - CSF clear and colorless - Glucose 48 - Protein elevated at 70 - CSF WBC elevated at 105 -> 108, predominantly lymphocytes and monocytes with normal % neutrophils  - RBC 2 -> 11 - Gram stain with no organisms seen - Culture negative at 2 days - CSF Cryptococcus negative - JC-virus pending - Autoimmune encephalitis panel is pending - HSV-2 is positive by PCR - IgG: insufficient amount to complete  US Biopsy Salivary gland Parotid Mass: Pending (completed 6/3)  Imaging I have reviewed images in epic and the results pertinent to this consultation are:  CT-scan of the brain MRI examination of the brain: Abnormal T2 hyperintense signal, primarily cortical/subcortical, in the right greater than left insula and postcentral gyrus, as well as in the splenium and left body of the corpus callosum and possibly the right thalamus and midbrain, concerning for an inflammatory or infectious process, such as PML, autoimmune encephalitis, or herpes simplex encephalitis. Subtle associated abnormal DWI signal in an irregular rim surrounding the lesions. - MRI brain with contrast (add on study): No enhancement of the lesions or other abnormal enhancement seen. This supports the DDx of PML as PML lesions usually do not enhance. The lack of enhancement significantly lowers the likelihood of a fungal or bacterial etiology.   Assessment: 62 y.o. male with past medical history of  A fib not on AC, PE, DM, HTN, HIV noncompliant with medications who presents to [facility] ED for left side weakness and frequent  falls that has been ongoing and getting progressively worse over the last 6 weeks.  Guided biopsy of parotid mass completed 6/3. ID starteed Biktarvy, Bactrim.   Impression: MRI findings are suggestive of PML secondary to uncontrolled HIV.   Recommendations: CSF labs still pending: autoimmune encephalitis panel, cytometry, cytology, JC virus - PT/OT  - Continue IV acyclovir - Initiation of HAART per ID  - High dose oral B12 daily   Pt seen by Neuro NP/APP and later by MD. Note/plan to be edited by MD as needed.    Lynnae January, DNP, AGACNP-BC [facility] Please use AMION for contact information & EPIC for messaging.   NEUROHOSPITALIST ADDENDUM Performed a face to face diagnostic evaluation.   I have reviewed the contents of history and physical exam as documented by PA/ARNP/Resident  and agree with above documentation.  I have discussed and formulated the above plan as documented. Edits to the note have been made as needed.  Impression/Key exam findings/Plan: continue to improve. MRI most consistent with PML in the setting of non compliance with HAART. Left side much stronger then when he came in. Unlikely for this to be autoimmune process. Would not expect autoimmune process to improve without treatment. Antimicrobial management per ID team. We will signoff. Please feel free to contact us with any questions or concerns.  Erick Blinks, MD [facility] [phone]   If 7pm to 7am, please call on call as listed on AMION.

## 2022-07-11 NOTE — TOC Benefit Eligibility Note (Signed)
Patient Product/process development scientist completed.    The patient is currently admitted and upon discharge could be taking Biktarvy 50-200-25 mg tablets.  The current 30 day co-pay is $35.00.   The patient is insured through [employer]   This test claim was processed through [employer]- copay amounts may vary at other pharmacies due to [employer], or as the patient moves through the different stages of their insurance plan.  Roland Earl, CPHT Pharmacy Patient Advocate Specialist [facility] Patient Advocate Team Direct Number: [phone]  Fax: [phone]

## 2022-07-12 DIAGNOSIS — B003 Herpesviral meningitis: Secondary | ICD-10-CM | POA: Diagnosis not present

## 2022-07-12 DIAGNOSIS — B20 Human immunodeficiency virus [HIV] disease: Secondary | ICD-10-CM | POA: Diagnosis not present

## 2022-07-12 DIAGNOSIS — A812 Progressive multifocal leukoencephalopathy: Secondary | ICD-10-CM | POA: Diagnosis not present

## 2022-07-12 DIAGNOSIS — R531 Weakness: Secondary | ICD-10-CM | POA: Diagnosis not present

## 2022-07-12 LAB — CBC WITH DIFFERENTIAL/PLATELET
Abs Immature Granulocytes: 0.02 10*3/uL (ref 0.00–0.07)
Basophils Absolute: 0 10*3/uL (ref 0.0–0.1)
Basophils Relative: 1 %
Eosinophils Absolute: 0.2 10*3/uL (ref 0.0–0.5)
Eosinophils Relative: 5 %
HCT: 36.4 % — ABNORMAL LOW (ref 39.0–52.0)
Hemoglobin: 12.1 g/dL — ABNORMAL LOW (ref 13.0–17.0)
Immature Granulocytes: 1 %
Lymphocytes Relative: 32 %
Lymphs Abs: 1.1 10*3/uL (ref 0.7–4.0)
MCH: 28.9 pg (ref 26.0–34.0)
MCHC: 33.2 g/dL (ref 30.0–36.0)
MCV: 87.1 fL (ref 80.0–100.0)
Monocytes Absolute: 0.3 10*3/uL (ref 0.1–1.0)
Monocytes Relative: 9 %
Neutro Abs: 1.9 10*3/uL (ref 1.7–7.7)
Neutrophils Relative %: 52 %
Platelets: 92 10*3/uL — ABNORMAL LOW (ref 150–400)
RBC: 4.18 MIL/uL — ABNORMAL LOW (ref 4.22–5.81)
RDW: 13.5 % (ref 11.5–15.5)
WBC: 3.6 10*3/uL — ABNORMAL LOW (ref 4.0–10.5)
nRBC: 0 % (ref 0.0–0.2)

## 2022-07-12 LAB — BASIC METABOLIC PANEL
Anion gap: 9 (ref 5–15)
BUN: 7 mg/dL (ref 6–20)
CO2: 23 mmol/L (ref 22–32)
Calcium: 8.4 mg/dL — ABNORMAL LOW (ref 8.9–10.3)
Chloride: 102 mmol/L (ref 98–111)
Creatinine, Ser: 0.87 mg/dL (ref 0.61–1.24)
GFR, Estimated: >60 mL/min (ref >60)
Glucose, Bld: 119 mg/dL — ABNORMAL HIGH (ref 70–99)
Potassium: 3.6 mmol/L (ref 3.5–5.1)
Sodium: 134 mmol/L — ABNORMAL LOW (ref 135–145)

## 2022-07-12 LAB — MAGNESIUM: Magnesium: 2.3 mg/dL (ref 1.7–2.4)

## 2022-07-12 LAB — CSF CULTURE W GRAM STAIN

## 2022-07-12 LAB — BRAIN NATRIURETIC PEPTIDE: B Natriuretic Peptide: 31.9 pg/mL (ref 0.0–100.0)

## 2022-07-12 LAB — FLOW CYTOMETRY REQUEST - FLUID (INPATIENT)

## 2022-07-12 NOTE — Progress Notes (Addendum)
Subjective:   No new complaints   Antibiotics:  Anti-infectives (From admission, onward)    Start     Dose/Rate Route Frequency Ordered Stop   07/10/22 1615  bictegravir-emtricitabine-tenofovir AF (BIKTARVY) 50-200-25 MG per tablet 1 tablet        1 tablet Oral Daily 07/10/22 1515     07/10/22 1615  sulfamethoxazole-trimethoprim (BACTRIM DS) 800-160 MG per tablet 1 tablet        1 tablet Oral Daily 07/10/22 1522     07/08/22 2315  acyclovir (ZOVIRAX) 1,000 mg in dextrose 5 % 250 mL IVPB  Status:  Discontinued        1,000 mg 270 mL/hr over 60 Minutes Intravenous Every 8 hours 07/08/22 2221 07/08/22 2229   07/08/22 2315  acyclovir (ZOVIRAX) 870 mg in dextrose 5 % 250 mL IVPB        10 mg/kg  86.9 kg (Adjusted) 267.4 mL/hr over 60 Minutes Intravenous Every 8 hours 07/08/22 2229         Medications: Scheduled Meds:  bictegravir-emtricitabine-tenofovir AF  1 tablet Oral Daily   vitamin B-12  2,000 mcg Oral Daily   heparin  5,000 Units Subcutaneous Q8H   rosuvastatin  10 mg Oral Daily   sulfamethoxazole-trimethoprim  1 tablet Oral Daily   Continuous Infusions:  acyclovir 870 mg (07/12/22 0505)   lactated ringers 50 mL/hr at 07/12/22 0301   PRN Meds:.acetaminophen, albuterol, HYDROmorphone (DILAUDID) injection, mouth rinse, oxyCODONE, polyethylene glycol, prochlorperazine    Objective: Weight change:   Intake/Output Summary (Last 24 hours) at 07/12/2022 1225 Last data filed at 07/12/2022 1100 Gross per 24 hour  Intake 1023.84 ml  Output 3100 ml  Net -2076.16 ml    Blood pressure 115/67, pulse 84, temperature 98.8 F (37.1 C), temperature source Oral, resp. rate 19, height 5\' 11"  (1.803 m), weight 104.3 kg, SpO2 94 %. Temp:  [98.4 F (36.9 C)-99.3 F (37.4 C)] 98.8 F (37.1 C) (06/05 1136) Pulse Rate:  [76-84] 84 (06/05 0310) Resp:  [19-20] 19 (06/05 1136) BP: (112-123)/(67-92) 115/67 (06/05 1136) SpO2:  [92 %-94 %] 94 % (06/05 1136)  Physical  Exam: Physical Exam Constitutional:      Appearance: He is well-developed.  HENT:     Head: Normocephalic and atraumatic.  Eyes:     Conjunctiva/sclera: Conjunctivae normal.  Cardiovascular:     Rate and Rhythm: Normal rate and regular rhythm.  Pulmonary:     Effort: Pulmonary effort is normal. No respiratory distress.     Breath sounds: Normal breath sounds. No stridor. No wheezing.  Abdominal:     General: There is no distension.     Palpations: Abdomen is soft.  Musculoskeletal:        General: Normal range of motion.     Cervical back: Normal range of motion and neck supple.  Skin:    General: Skin is warm and dry.     Findings: No erythema or rash.  Neurological:     Mental Status: He is alert and oriented to person, place, and time.  Psychiatric:        Mood and Affect: Mood normal.        Behavior: Behavior normal.        Thought Content: Thought content normal.        Judgment: Judgment normal.     Left sided weakness  CBC:    BMET Recent Labs    07/11/22 0216 07/12/22 0630  NA 132* 134*  K 3.8 3.6  CL 99 102  CO2 22 23  GLUCOSE 111* 119*  BUN 7 7  CREATININE 0.83 0.87  CALCIUM 8.6* 8.4*      Liver Panel  No results for input(s): "PROT", "ALBUMIN", "AST", "ALT", "ALKPHOS", "BILITOT", "BILIDIR", "IBILI" in the last 72 hours.      Sedimentation Rate No results for input(s): "ESRSEDRATE" in the last 72 hours. C-Reactive Protein No results for input(s): "CRP" in the last 72 hours.  Micro Results: Recent Results (from the past 720 hour(s))  Urine Culture     Status: Abnormal   Collection Time: 07/05/22  9:19 AM   Specimen: Urine, Clean Catch  Result Value Ref Range Status   Specimen Description   Final    URINE, CLEAN CATCH Performed at [facility], [address] [place], [place] [address]    Special Requests   Final    NONE Reflexed from [id number] Performed at [facility], [address] [place], [place] [address]    Culture  MULTIPLE SPECIES PRESENT, SUGGEST RECOLLECTION (A)  Final   Report Status 07/06/2022 FINAL  Final  Blood Culture (routine x 2)     Status: None   Collection Time: 07/05/22  9:46 AM   Specimen: Left Antecubital; Blood  Result Value Ref Range Status   Specimen Description LEFT ANTECUBITAL  Final   Special Requests   Final    BOTTLES DRAWN AEROBIC AND ANAEROBIC Blood Culture results may not be optimal due to an excessive volume of blood received in culture bottles   Culture   Final    NO GROWTH 6 DAYS Performed at [facility], [address] [place], [place] [address]    Report Status 07/11/2022 FINAL  Final  Blood Culture (routine x 2)     Status: None   Collection Time: 07/05/22  9:46 AM   Specimen: BLOOD RIGHT ARM  Result Value Ref Range Status   Specimen Description BLOOD RIGHT ARM  Final   Special Requests   Final    BOTTLES DRAWN AEROBIC AND ANAEROBIC Blood Culture results may not be optimal due to an excessive volume of blood received in culture bottles   Culture   Final    NO GROWTH 6 DAYS Performed at [facility], [address], [place], [place] [address]    Report Status 07/11/2022 FINAL  Final  Resp panel by RT-PCR (RSV, Flu A&B, Covid) Anterior Nasal Swab     Status: None   Collection Time: 07/05/22  9:47 AM   Specimen: Anterior Nasal Swab  Result Value Ref Range Status   SARS Coronavirus 2 by RT PCR NEGATIVE NEGATIVE Final    Comment: (NOTE) SARS-CoV-2 target nucleic acids are NOT DETECTED.  The SARS-CoV-2 RNA is generally detectable in upper respiratory specimens during the acute phase of infection. The lowest concentration of SARS-CoV-2 viral copies this assay can detect is 138 copies/mL. A negative result does not preclude SARS-Cov-2 infection and should not be used as the sole basis for treatment or other patient management decisions. A negative result may occur with  improper specimen collection/handling, submission of specimen other than nasopharyngeal swab,  presence of viral mutation(s) within the areas targeted by this assay, and inadequate number of viral copies(<138 copies/mL). A negative result must be combined with clinical observations, patient history, and epidemiological information. The expected result is Negative.  Fact Sheet for Patients:  BloggerCourse.com  Fact Sheet for Healthcare Providers:  SeriousBroker.it  This test is no t yet approved  or cleared by the [place] and  has been authorized for detection and/or diagnosis of SARS-CoV-2 by FDA under an Emergency Use Authorization (EUA). This EUA will remain  in effect (meaning this test can be used) for the duration of the COVID-19 declaration under Section 564(b)(1) of the Act, 21 U.S.C.section 360bbb-3(b)(1), unless the authorization is terminated  or revoked sooner.       Influenza A by PCR NEGATIVE NEGATIVE Final   Influenza B by PCR NEGATIVE NEGATIVE Final    Comment: (NOTE) The Xpert Xpress SARS-CoV-2/FLU/RSV plus assay is intended as an aid in the diagnosis of influenza from Nasopharyngeal swab specimens and should not be used as a sole basis for treatment. Nasal washings and aspirates are unacceptable for Xpert Xpress SARS-CoV-2/FLU/RSV testing.  Fact Sheet for Patients: BloggerCourse.com  Fact Sheet for Healthcare Providers: SeriousBroker.it  This test is not yet approved or cleared by the [place] FDA and has been authorized for detection and/or diagnosis of SARS-CoV-2 by FDA under an Emergency Use Authorization (EUA). This EUA will remain in effect (meaning this test can be used) for the duration of the COVID-19 declaration under Section 564(b)(1) of the Act, 21 U.S.C. section 360bbb-3(b)(1), unless the authorization is terminated or revoked.     Resp Syncytial Virus by PCR NEGATIVE NEGATIVE Final    Comment: (NOTE) Fact Sheet for  Patients: BloggerCourse.com  Fact Sheet for Healthcare Providers: SeriousBroker.it  This test is not yet approved or cleared by the [place] FDA and has been authorized for detection and/or diagnosis of SARS-CoV-2 by FDA under an Emergency Use Authorization (EUA). This EUA will remain in effect (meaning this test can be used) for the duration of the COVID-19 declaration under Section 564(b)(1) of the Act, 21 U.S.C. section 360bbb-3(b)(1), unless the authorization is terminated or revoked.  Performed at [facility], [address], [place], [place] [address]   Urine Culture     Status: Abnormal   Collection Time: 07/07/22  7:21 PM   Specimen: Urine, Clean Catch  Result Value Ref Range Status   Specimen Description   Final    URINE, CLEAN CATCH Performed at [facility], [address], [place], [place] [address]    Special Requests   Final    NONE Performed at [facility], [address], [place], [place] [address]    Culture (A)  Final    <10,000 COLONIES/mL INSIGNIFICANT GROWTH Performed at [facility], [address], [place], [place] [address]    Report Status 07/08/2022 FINAL  Final  CSF culture w Gram Stain     Status: None   Collection Time: 07/08/22 12:30 PM   Specimen: CSF; Cerebrospinal Fluid  Result Value Ref Range Status   Specimen Description   Final    CSF Performed at [facility], [address] [place], [place] [address]    Special Requests   Final    NONE Performed at [facility], [address] [place], [place] [address]    Gram Stain   Final    WBC PRESENT,BOTH PMN AND MONONUCLEAR NO ORGANISMS SEEN CYTOSPIN Gram Stain Report Called to,Read Back By and Verified With: Derl Barrow RN @1425  ON 6.1.2024 BY [facility] Performed at [facility], [address] [place], [place] [address]    Culture    Final    NO GROWTH 3 DAYS Performed at [facility], [address] [place], [place] [address]  Report Status 07/12/2022 FINAL  Final    Studies/Results: No results found.    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  He underwent lumbar puncture which showed lymphocytic pleocytosis with a white count of 105 glucose of 48 protein of 70.  Meningitis encephalitis panel positive for HSV-2. He also has bilateral parotid gland masses and is now sp IR guided aspirate  #1 Left sided weakness appears due to PML in context of uncontrolled HIV  Continue Biktarvy and Bactrim  #2 HSV2 :unlikely contributing to his CNS pathology but will treat with acyclovir   3.  Positive RPR titer is low at 1-1 we will follow-up tPA negative = false +  Parotid gland swelling:  negative for malignancy   4 Disp: it sounds that the burden of his CNS pathology in disability that he really is going to need to be placed in a skilled nursing facility at least for the short-term if not potentially long-term.    I have personally spent 50 minutes involved in face-to-face and non-face-to-face activities for this patient on the day of the visit. Professional time spent includes the following activities: Preparing to see the patient (review of tests), Obtaining and/or reviewing separately obtained history (admission/discharge record), Performing a medically appropriate examination and/or evaluation , Ordering  medications/tests/procedures, referring and communicating with other health care professionals, Documenting clinical information in the EMR, Independently interpreting results (not separately reported), Communicating results to the patient/family/caregiver, Counseling and educating the patient/family/caregiver and Care coordination (not separately reported).    Dr Drue Second is covering tomorrow for questions and on weekend.     LOS: 5 days   Acey Lav 07/12/2022, 12:25 PM

## 2022-07-12 NOTE — Plan of Care (Signed)
  Problem: Clinical Measurements: Goal: Will remain free from infection Outcome: Not Progressing   Problem: Safety: Goal: Ability to remain free from injury will improve Outcome: Not Progressing   Problem: Skin Integrity: Goal: Risk for impaired skin integrity will decrease Outcome: Not Progressing   

## 2022-07-12 NOTE — Progress Notes (Signed)
Occupational Therapy Treatment Patient Details Name: Jose Lamb MRN: [id number] DOB: [birth year] Today's Date: 07/12/2022   History of present illness Jose Lamb is a 62 y.o. male who presents with 6 weeks of left-sided weakness and frequent falls. MRI brain revealed concern for inflammatory or infectious process. PMHx: CVA, paroxysmal atrial fibrillation not on oral anticoagulation, asthma, HIV with noncompliance, history of pulmonary embolism.   OT comments  Pt progressing toward therapy goals. Pt eager and motivated to participate in therapy. Pt continues to present with L hemibody weakness, balance and activity tolerance deficits. Pt competed stand pivot transfer from EOB>BSC using RW with mod A +2, min A +2 for stand pivot transfer from BSC>EOB. Pt completed room/hallway level mobility using RW with mod A +2 requiring max cues to correct L lateral lean. Pt would continue to benefit from acute OT services to facilitate progress toward therapy goals. Discharge recommendation of intensive inpatient follow up therapy, >3 hours/day after discharge remains appropriate.     Recommendations for follow up therapy are one component of a multi-disciplinary discharge planning process, led by the attending physician.  Recommendations may be updated based on patient status, additional functional criteria and insurance authorization.    Assistance Recommended at Discharge Frequent or constant Supervision/Assistance  Patient can return home with the following  A lot of help with walking and/or transfers;Two people to help with walking and/or transfers;A lot of help with bathing/dressing/bathroom;Two people to help with bathing/dressing/bathroom;Assistance with cooking/housework;Assist for transportation;Help with stairs or ramp for entrance   Equipment Recommendations  Other (comment) (defer)    Recommendations for Other Services      Precautions / Restrictions Precautions Precautions:  Fall Precaution Comments: Lt hemi Restrictions Weight Bearing Restrictions: No       Mobility Bed Mobility Overal bed mobility: Needs Assistance Bed Mobility: Supine to Sit     Supine to sit: Mod assist     General bed mobility comments: trunk and LLE management    Transfers Overall transfer level: Needs assistance Equipment used: Rolling walker (2 wheels) Transfers: Sit to/from Stand Sit to Stand: Mod assist, +2 physical assistance, +2 safety/equipment           General transfer comment: STS transfer from EOB and recliner using RW with mod A +2 and min cues for hand placement. Room/hallway level mobility using RW with mod A +2 and chair follow, requires cues to correct L lateral lean and posture     Balance Overall balance assessment: Needs assistance Sitting-balance support: Feet supported, Single extremity supported Sitting balance-Leahy Scale: Poor Sitting balance - Comments: sitting EOB   Standing balance support: Single extremity supported, During functional activity, Reliant on assistive device for balance Standing balance-Leahy Scale: Poor Standing balance comment: reliant on RW and external support of therapist                           ADL either performed or assessed with clinical judgement   ADL Overall ADL's : Needs assistance/impaired     Grooming: Wash/dry face;Sitting;Supervision/safety Grooming Details (indicate cue type and reason): sitting EOB with unilateral UE support                 Toilet Transfer: Moderate assistance;+2 for safety/equipment;+2 for physical assistance;Stand-pivot;BSC/3in1 Toilet Transfer Details (indicate cue type and reason): transfer from [facility] towards R Toileting- Clothing Manipulation and Hygiene: Total assistance;+2 for physical assistance;+2 for safety/equipment;Sit to/from stand Toileting - Clothing Manipulation Details (indicate cue type and reason): total  A for pericare post BM, required BUE  support on RW for stability during task     Functional mobility during ADLs: Moderate assistance;+2 for physical assistance;+2 for safety/equipment;Rolling walker (2 wheels) General ADL Comments: limited by L hemiplegia and decreased activity tolerance    Extremity/Trunk Assessment Upper Extremity Assessment Upper Extremity Assessment: RUE deficits/detail RUE Deficits / Details: overall WFL RUE Coordination: WNL [name] Deficits / Details: generalized weakness, full AROM [name] Coordination: decreased fine motor   Lower Extremity Assessment Lower Extremity Assessment: Defer to PT evaluation        Vision   Vision Assessment?: No apparent visual deficits   Perception Perception Perception: Not tested   Praxis Praxis Praxis: Not tested    Cognition Arousal/Alertness: Awake/alert Behavior During Therapy: Flat affect Overall Cognitive Status: Within Functional Limits for tasks assessed                                 General Comments: Pleasant and cooperative        Exercises      Shoulder Instructions       General Comments VSS on RA, wife present and supportive    Pertinent Vitals/ Pain       Pain Assessment Pain Assessment: Faces Faces Pain Scale: Hurts a little bit Pain Location: back Pain Descriptors / Indicators: Grimacing, Guarding Pain Intervention(s): Limited activity within patient's tolerance, Monitored during session  Home Living                                          Prior Functioning/Environment              Frequency  Min 2X/week        Progress Toward Goals  OT Goals(current goals can now be found in the care plan section)  Progress towards OT goals: Progressing toward goals  Acute Rehab OT Goals Patient Stated Goal: to get stronger OT Goal Formulation: With patient Time For Goal Achievement: 07/23/22 Potential to Achieve Goals: Good ADL Goals Pt Will Perform Upper Body Dressing: with  set-up;sitting Pt Will Perform Lower Body Dressing: with min assist;sit to/from stand Pt Will Transfer to Toilet: with min assist;ambulating Additional ADL Goal #1: Pt will use [name] as a functional assist for all ADLs without verbal cues  Plan Discharge plan remains appropriate;Frequency remains appropriate    Co-evaluation    PT/OT/SLP Co-Evaluation/Treatment: Yes Reason for Co-Treatment: Complexity of the patient's impairments (multi-system involvement);For patient/therapist safety;To address functional/ADL transfers   OT goals addressed during session: ADL's and self-care      AM-PAC OT "6 Clicks" Daily Activity     Outcome Measure   Help from another person eating meals?: A Little Help from another person taking care of personal grooming?: A Little Help from another person toileting, which includes using toliet, bedpan, or urinal?: A Lot Help from another person bathing (including washing, rinsing, drying)?: A Lot Help from another person to put on and taking off regular upper body clothing?: A Little Help from another person to put on and taking off regular lower body clothing?: A Lot 6 Click Score: 15    End of Session Equipment Utilized During Treatment: Gait belt;Rolling walker (2 wheels)  OT Visit Diagnosis: Unsteadiness on feet (R26.81);Other abnormalities of gait and mobility (R26.89);Muscle weakness (generalized) (M62.81);History of falling (Z91.81);Repeated falls (R29.6);Hemiplegia and hemiparesis  Hemiplegia - Right/Left: Left Hemiplegia - dominant/non-dominant: Non-Dominant   Activity Tolerance Patient tolerated treatment well   Patient Left in chair;with call bell/phone within reach;with chair alarm set;with family/visitor present   Nurse Communication Mobility status        Time: 5784-6962 OT Time Calculation (min): 29 min  Charges: OT General Charges $OT Visit: 1 Visit OT Treatments $Self Care/Home Management : 8-22 mins  Sherley Bounds, OTS Acute  Rehabilitation Services Office [phone] Secure Chat Communication Preferred   Sherley Bounds 07/12/2022, 1:38 PM

## 2022-07-12 NOTE — Progress Notes (Signed)
Physical Therapy Treatment Patient Details Name: Jose Lamb MRN: [id number] DOB: 02-Oct-1960 Today's Date: 07/12/2022   History of Present Illness Jose Lamb is a 62 y.o. male who presents with 6 weeks of left-sided weakness and frequent falls. MRI brain revealed concern for inflammatory or infectious process. PMHx: CVA, paroxysmal atrial fibrillation not on oral anticoagulation, asthma, HIV with noncompliance, history of pulmonary embolism.    PT Comments    Pt greeted resting in bed and agreeable to PT/OT session with focus on progression of functional mobility. Pt able complete bed mobility with mod A to elevate trunk and manage LLE. Pt needing min-mod A +2 to step pivot transfer EOB<>BSC with pt demonstrating improved ability to weight shift and step R and LLE. Pt able to progress gait distance into hall this session with mod A +2 and RW for support with multimodal cues throughout for midline posture and RW management. Current plan remains appropriate to address deficits and maximize functional independence and decrease caregiver burden. Pt continues to benefit from skilled PT services to progress toward functional mobility goals.    Recommendations for follow up therapy are one component of a multi-disciplinary discharge planning process, led by the attending physician.  Recommendations may be updated based on patient status, additional functional criteria and insurance authorization.  Follow Up Recommendations       Assistance Recommended at Discharge Frequent or constant Supervision/Assistance  Patient can return home with the following Two people to help with walking and/or transfers;A lot of help with bathing/dressing/bathroom;Assistance with cooking/housework;Assist for transportation;Help with stairs or ramp for entrance   Equipment Recommendations   (TBD)    Recommendations for Other Services       Precautions / Restrictions Precautions Precautions: Fall Precaution  Comments: Lt hemi Restrictions Weight Bearing Restrictions: No     Mobility  Bed Mobility Overal bed mobility: Needs Assistance Bed Mobility: Supine to Sit     Supine to sit: Mod assist     General bed mobility comments: trunk and LLE management    Transfers Overall transfer level: Needs assistance Equipment used: Rolling walker (2 wheels) Transfers: Sit to/from Stand, Bed to chair/wheelchair/BSC Sit to Stand: Mod assist, +2 physical assistance, +2 safety/equipment, Min assist           General transfer comment: mod A +2 to power up from EOB on initial stand, down to min A +2 with repetitive stands throughout session, pt continues to require increased time and cues for hand placement, mod A +2 to step pivot with HHA x2 EOB>BSC, min A +2 with RW support to step pivot BSC>EOB.    Ambulation/Gait Ambulation/Gait assistance: Mod assist, +2 physical assistance, +2 safety/equipment Gait Distance (Feet): 30 Feet Assistive device: Rolling walker (2 wheels) Gait Pattern/deviations: Step-to pattern, Decreased stride length, Decreased stance time - right, Decreased step length - left, Drifts right/left, Trunk flexed Gait velocity: decr     General Gait Details: mod A +2 with RW support, frequent multimodal cues and standing breaks to correct L lateral lean, pt able to self advance LLE this session, however pt demonstrating poor eccentric control, pt continues to fatigue quickly with lateral lean progressing with fatigue, pt needing freguent cues to remain focused on task at hand   Stairs             Wheelchair Mobility    Modified Rankin (Stroke Patients Only)       Balance Overall balance assessment: Needs assistance Sitting-balance support: Feet supported, Single extremity supported Sitting balance-Leahy Scale: Poor  Sitting balance - Comments: sitting EOB   Standing balance support: Single extremity supported, During functional activity, Reliant on assistive  device for balance Standing balance-Leahy Scale: Poor Standing balance comment: reliant on RW and external support of therapist                            Cognition Arousal/Alertness: Awake/alert Behavior During Therapy: Flat affect Overall Cognitive Status: Within Functional Limits for tasks assessed                                 General Comments: Pleasant and cooperative        Exercises      General Comments General comments (skin integrity, edema, etc.): VSS on RA, pt spouse present and supportive throughout session      Pertinent Vitals/Pain Pain Assessment Pain Assessment: Faces Faces Pain Scale: Hurts a little bit Pain Location: back Pain Descriptors / Indicators: Grimacing, Guarding Pain Intervention(s): Monitored during session, Limited activity within patient's tolerance, Repositioned    Home Living                          Prior Function            PT Goals (current goals can now be found in the care plan section) Acute Rehab PT Goals PT Goal Formulation: With patient Time For Goal Achievement: 07/23/22 Progress towards PT goals: Progressing toward goals    Frequency    Min 4X/week      PT Plan      Co-evaluation PT/OT/SLP Co-Evaluation/Treatment: Yes Reason for Co-Treatment: Complexity of the patient's impairments (multi-system involvement);For patient/therapist safety;To address functional/ADL transfers PT goals addressed during session: Proper use of DME;Balance;Mobility/safety with mobility OT goals addressed during session: ADL's and self-care      AM-PAC PT "6 Clicks" Mobility   Outcome Measure  Help needed turning from your back to your side while in a flat bed without using bedrails?: A Little Help needed moving from lying on your back to sitting on the side of a flat bed without using bedrails?: A Lot Help needed moving to and from a bed to a chair (including a wheelchair)?: Total Help needed  standing up from a chair using your arms (e.g., wheelchair or bedside chair)?: Total Help needed to walk in hospital room?: Total Help needed climbing 3-5 steps with a railing? : Total 6 Click Score: 9    End of Session Equipment Utilized During Treatment: Gait belt Activity Tolerance: Patient tolerated treatment well Patient left: in chair;with call bell/phone within reach;with chair alarm set;with family/visitor present Nurse Communication: Mobility status PT Visit Diagnosis: Unsteadiness on feet (R26.81);Other abnormalities of gait and mobility (R26.89);Ataxic gait (R26.0);Muscle weakness (generalized) (M62.81);Hemiplegia and hemiparesis;Other symptoms and signs involving the nervous system (R29.898);Difficulty in walking, not elsewhere classified (R26.2) Hemiplegia - Right/Left: Left Hemiplegia - dominant/non-dominant: Non-dominant Hemiplegia - caused by: Unspecified     Time: 9562-1308 PT Time Calculation (min) (ACUTE ONLY): 34 min  Charges:  $Gait Training: 8-22 mins                    Tashae Inda R. PTA Acute Rehabilitation Services Office: [phone]   Catalina Antigua 07/12/2022, 1:59 PM

## 2022-07-12 NOTE — TOC Progression Note (Signed)
Transition of Care [facility]) - Progression Note    Patient Details  Name: Jose Lamb MRN: [id number] Date of Birth: 01/07/1961  [facility] CM/SW Contact  Gordy Clement, RN Phone Number: 07/12/2022, 2:02 PM  Clinical Narrative:     CM called Wife back with update. CIR is following patient .    Expected Discharge Plan: IP Rehab Facility Barriers to Discharge: Continued Medical Work up, Conservator, museum/gallery and Services In-house Referral: Clinical Social Work     Living arrangements for the past 2 months: Single Family Home                                       Social Determinants of Health (SDOH) Interventions SDOH Screenings   Food Insecurity: No Food Insecurity (07/08/2022)  Housing: Low Risk  (07/08/2022)  Transportation Needs: No Transportation Needs (07/08/2022)  Utilities: Not At Risk (07/08/2022)  Tobacco Use: Low Risk  (07/07/2022)    Readmission Risk Interventions     No data to display

## 2022-07-12 NOTE — Progress Notes (Signed)
PROGRESS NOTE                                                                                                                                                                                                             Patient Demographics:    Jose Lamb, is a 62 y.o. male, DOB - 07/02/1960, ZOX:[id number]  Outpatient Primary MD for the patient is Ladon Applebaum    LOS - 5  Admit date - 07/07/2022    Chief Complaint  Patient presents with   Weakness       Brief Narrative (HPI from H&P)    62 y.o. male with medical history significant for prior CVA, paroxysmal atrial fibrillation not on oral anticoagulation, asthma, HIV with noncompliance, history of pulmonary embolism, who presents to [facility] ED with complaints of 6 weeks of left-sided weakness involving his left arm and left leg.  Associated with frequent falls.  Has not been taking his ART medications.  MRI of the brain showing right-sided T2 hypertense signal on the MRI primarily cortical/subcortical, in the right greater than left insula and postcentral gyrus, neurology and ID were consulted and he was admitted to the hospital for further care.   Subjective:   Patient in bed, appears comfortable, denies any headache, no fever, no chest pain or pressure, no shortness of breath , no abdominal pain. No new focal weakness. Improved + L Leg weakness   Assessment  & Plan :    Left-sided weakness leg significantly more weak than the arm.  MRI of the brain suggestive of T2 hypertense signal primarily cortical/subcortical, in the right greater than left insula and postcentral gyrus, neurology and ID on board, he was noncompliant with his HIV medications.  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   Continue supportive care with PT OT.  Left lower extremity weakness and left mildly less improved on 07/12/2022   HIV - AIDs.  CD4 count less than 54 with CD4 helper T-cell percentage 5%.  Noncompliant with medications, counseled on compliance, ID on board defer management to ID.  He is now agreeable to taking his HIV medications as prescribed, see above.  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  Per ID requested ENT input, per ENT CT guided biopsy by IR if required or else outpatient ENT follow-up.  Discussed with Dr. Murlean Hark and ID physician Dr. Gwynn Burly.  He is s/p CT-guided biopsy of both parotid glands by IR on 07/10/2022, bilateral parotid gland FNAC done by IR appears unremarkable, prelim results noted on 07/12/2022, CSF autoimmune panel appears negative, pending CSF flow cytometry.  Dyslipidemia.  Placed on statin.  Asthma.  Stable no acute issues.  Obesity.  BMI 32.  Follow-up with PCP for weight loss.      Condition -   Guarded  Family Communication  : Updated wife on 07/11/2022  Code Status :  Full  Consults  :  ID, Neuro, ENT, IR  PUD Prophylaxis :    Procedures  :     MRI - 1. Abnormal T2 hyperintense signal, primarily cortical/subcortical, in the right greater than left insula and postcentral gyrus, as well as in the splenium and left body of the corpus callosum and possibly the right thalamus and midbrain. This is nonspecific, and while this could reflect sequela of prior infarcts, it is also concerning for an inflammatory or infectious process, such as PML, autoimmune encephalitis, or herpes simplex encephalitis, although this is not particularly typical of any the entities. MRI with contrast could be helpful. Correlate with symptoms and consider CSF testing. 2. Bilateral parotid masses are better seen on the prior CT and are incompletely imaged on this exam.      Disposition Plan  :      DVT Prophylaxis  :    heparin injection  5,000 Units Start: 07/08/22 1400    Lab Results  Component Value Date   PLT 92 (L) 07/12/2022    Diet :  Diet Order             Diet regular Room service appropriate? Yes; Fluid consistency: Thin  Diet effective now                    Inpatient Medications  Scheduled Meds:  bictegravir-emtricitabine-tenofovir AF  1 tablet Oral Daily   vitamin B-12  2,000 mcg Oral Daily   heparin  5,000 Units Subcutaneous Q8H   rosuvastatin  10 mg Oral Daily   sulfamethoxazole-trimethoprim  1 tablet Oral Daily   Continuous Infusions:  acyclovir 870 mg (07/12/22 0505)   lactated ringers 50 mL/hr at 07/12/22 0301   PRN Meds:.acetaminophen, albuterol, HYDROmorphone (DILAUDID) injection, mouth rinse, oxyCODONE, polyethylene glycol, prochlorperazine  Antibiotics  :    Anti-infectives (From admission, onward)    Start     Dose/Rate Route Frequency Ordered Stop   07/10/22 1615  bictegravir-emtricitabine-tenofovir AF (BIKTARVY) 50-200-25 MG per tablet 1 tablet        1 tablet Oral Daily 07/10/22 1515     07/10/22 1615  sulfamethoxazole-trimethoprim (BACTRIM DS) 800-160 MG per tablet 1 tablet        1 tablet Oral Daily 07/10/22 1522     07/08/22 2315  acyclovir (ZOVIRAX) 1,000 mg in dextrose 5 % 250 mL IVPB  Status:  Discontinued        1,000 mg 270 mL/hr over 60 Minutes Intravenous Every 8 hours 07/08/22 2221 07/08/22 2229   07/08/22 2315  acyclovir (ZOVIRAX) 870 mg in dextrose 5 % 250 mL IVPB        10 mg/kg  86.9 kg (Adjusted) 267.4 mL/hr over 60 Minutes Intravenous Every 8 hours 07/08/22 2229  Objective:   Vitals:   07/11/22 1146 07/11/22 1952 07/11/22 2300 07/12/22 0310  BP: (!) 117/92 123/82 (!) 117/92 112/83  Pulse:   76 84  Resp: 20 20 20 20   Temp: 98.9 F (37.2 C) 99.3 F (37.4 C) 98.9 F (37.2 C) 98.9 F (37.2 C)  TempSrc: Oral Oral  Oral  SpO2:  92% 92% 93%  Weight:      Height:        Wt Readings from Last 3 Encounters:  07/07/22 104.3 kg   07/05/22 104.3 kg  04/08/22 106.7 kg     Intake/Output Summary (Last 24 hours) at 07/12/2022 0954 Last data filed at 07/12/2022 0301 Gross per 24 hour  Intake 1271.74 ml  Output 2200 ml  Net -928.26 ml     Physical Exam  Awake Alert, No new F.N deficits, L leg 4/5, L arm mildly weak [place].AT,PERRAL Supple Neck, No JVD,   Symmetrical Chest wall movement, Good air movement bilaterally, CTAB RRR,No Gallops,Rubs or new Murmurs,  +ve B.Sounds, Abd Soft, No tenderness,   No Cyanosis, Clubbing or edema        Data Review:    Recent Labs  Lab 07/07/22 1819 07/07/22 1841 07/08/22 0500 07/11/22 0216 07/12/22 0630  WBC 4.1  --  3.9* 5.3 3.6*  HGB 13.1 12.9* 12.1* 12.5* 12.1*  HCT 39.9 38.0* 37.7* 37.7* 36.4*  PLT 112*  --  108* 97* 92*  MCV 91.5  --  91.7 87.1 87.1  MCH 30.0  --  29.4 28.9 28.9  MCHC 32.8  --  32.1 33.2 33.2  RDW 14.4  --  14.4 13.6 13.5  LYMPHSABS 1.0  --   --  1.4 1.1  MONOABS 0.6  --   --  0.6 0.3  EOSABS 0.2  --   --  0.0 0.2  BASOSABS 0.0  --   --  0.0 0.0    Recent Labs  Lab  0000 07/05/22 1141 07/07/22 1819 07/07/22 1841 07/08/22 0500 07/08/22 1405 07/09/22 0323 07/10/22 0546 07/11/22 0216 07/12/22 0630  NA  --   --  135   < > 138  --  136 134* 132* 134*  K  --   --  3.4*   < > 3.2*  --  3.6 3.8 3.8 3.6  CL  --   --  102   < > 105  --  104 102 99 102  CO2   < >  --  25  --  28  --  24 26 22 23   ANIONGAP   < >  --  8  --  5  --  8 6 11 9   GLUCOSE  --   --  95   < > 104*  --  100* 99 111* 119*  BUN  --   --  14   < > 13  --  10 7 7 7   CREATININE  --   --  0.78   < > 0.79  --  0.77 0.75 0.83 0.87  AST  --   --  21  --   --   --   --   --   --   --   ALT  --   --  24  --   --   --   --   --   --   --   ALKPHOS  --   --  7  --   --   --   --   --   --   --  BILITOT  --   --  0.6  --   --   --   --   --   --   --   ALBUMIN  --   --  3.6  --   --  NOT PERFORMED  --   --   --   --   LATICACIDVEN  --  0.7  --   --   --   --   --   --   --    --   INR  --   --  1.0  --   --   --   --  1.1  --   --   BNP  --   --   --   --   --   --   --   --  70.9 31.9  MG  --   --   --   --  2.3  --   --   --  2.0 2.3  CALCIUM   < >  --  8.4*  --  8.6*  --  8.3* 8.6* 8.6* 8.4*   < > = values in this interval not displayed.      Recent Labs  Lab 07/05/22 1141 07/07/22 1819 07/07/22 1819 07/08/22 0500 07/09/22 0323 07/10/22 0546 07/11/22 0216 07/12/22 0630  LATICACIDVEN 0.7  --   --   --   --   --   --   --   INR  --  1.0  --   --   --  1.1  --   --   BNP  --   --   --   --   --   --  70.9 31.9  MG  --   --   --  2.3  --   --  2.0 2.3  CALCIUM  --  8.4*   < > 8.6* 8.3* 8.6* 8.6* 8.4*   < > = values in this interval not displayed.     Lab Results  Component Value Date   CHOL 197 04/10/2022   HDL 39 (L) 04/10/2022   LDLCALC 126 (H) 04/10/2022   TRIG 158 (H) 04/10/2022   CHOLHDL 5.1 04/10/2022    Lab Results  Component Value Date   HGBA1C 6.6 (H) 04/10/2022      Micro Results Recent Results (from the past 240 hour(s))  Urine Culture     Status: Abnormal   Collection Time: 07/05/22  9:19 AM   Specimen: Urine, Clean Catch  Result Value Ref Range Status   Specimen Description   Final    URINE, CLEAN CATCH Performed at [facility], [address], [place], [place] [address]    Special Requests   Final    NONE Reflexed from [phone] Performed at [facility], [address], [place], [place] [address]    Culture MULTIPLE SPECIES PRESENT, SUGGEST RECOLLECTION (A)  Final   Report Status 07/06/2022 FINAL  Final  Blood Culture (routine x 2)     Status: None   Collection Time: 07/05/22  9:46 AM   Specimen: Left Antecubital; Blood  Result Value Ref Range Status   Specimen Description LEFT ANTECUBITAL  Final   Special Requests   Final    BOTTLES DRAWN AEROBIC AND ANAEROBIC Blood Culture results may not be optimal due to an excessive volume of blood received in culture bottles   Culture   Final    NO GROWTH 6  DAYS Performed at [facility], [address] [place], [place] [address]    Report Status 07/11/2022 FINAL  Final  Blood Culture (routine x 2)     Status: None   Collection Time: 07/05/22  9:46 AM   Specimen: BLOOD RIGHT ARM  Result Value Ref Range Status   Specimen Description BLOOD RIGHT ARM  Final   Special Requests   Final    BOTTLES DRAWN AEROBIC AND ANAEROBIC Blood Culture results may not be optimal due to an excessive volume of blood received in culture bottles   Culture   Final    NO GROWTH 6 DAYS Performed at [facility], [address], [place], [place] [address]    Report Status 07/11/2022 FINAL  Final  Resp panel by RT-PCR (RSV, Flu A&B, Covid) Anterior Nasal Swab     Status: None   Collection Time: 07/05/22  9:47 AM   Specimen: Anterior Nasal Swab  Result Value Ref Range Status   SARS Coronavirus 2 by RT PCR NEGATIVE NEGATIVE Final    Comment: (NOTE) SARS-CoV-2 target nucleic acids are NOT DETECTED.  The SARS-CoV-2 RNA is generally detectable in upper respiratory specimens during the acute phase of infection. The lowest concentration of SARS-CoV-2 viral copies this assay can detect is 138 copies/mL. A negative result does not preclude SARS-Cov-2 infection and should not be used as the sole basis for treatment or other patient management decisions. A negative result may occur with  improper specimen collection/handling, submission of specimen other than nasopharyngeal swab, presence of viral mutation(s) within the areas targeted by this assay, and inadequate number of viral copies(<138 copies/mL). A negative result must be combined with clinical observations, patient history, and epidemiological information. The expected result is Negative.  Fact Sheet for Patients:  BloggerCourse.com  Fact Sheet for Healthcare Providers:  SeriousBroker.it  This test is no t yet approved or cleared by the [place] FDA and   has been authorized for detection and/or diagnosis of SARS-CoV-2 by FDA under an Emergency Use Authorization (EUA). This EUA will remain  in effect (meaning this test can be used) for the duration of the COVID-19 declaration under Section 564(b)(1) of the Act, 21 U.S.C.section 360bbb-3(b)(1), unless the authorization is terminated  or revoked sooner.       Influenza A by PCR NEGATIVE NEGATIVE Final   Influenza B by PCR NEGATIVE NEGATIVE Final    Comment: (NOTE) The Xpert Xpress SARS-CoV-2/FLU/RSV plus assay is intended as an aid in the diagnosis of influenza from Nasopharyngeal swab specimens and should not be used as a sole basis for treatment. Nasal washings and aspirates are unacceptable for Xpert Xpress SARS-CoV-2/FLU/RSV testing.  Fact Sheet for Patients: BloggerCourse.com  Fact Sheet for Healthcare Providers: SeriousBroker.it  This test is not yet approved or cleared by the [place] FDA and has been authorized for detection and/or diagnosis of SARS-CoV-2 by FDA under an Emergency Use Authorization (EUA). This EUA will remain in effect (meaning this test can be used) for the duration of the COVID-19 declaration under Section 564(b)(1) of the Act, 21 U.S.C. section 360bbb-3(b)(1), unless the authorization is terminated or revoked.     Resp Syncytial Virus by PCR NEGATIVE NEGATIVE Final    Comment: (NOTE) Fact Sheet for Patients: BloggerCourse.com  Fact Sheet for Healthcare Providers: SeriousBroker.it  This test is not yet approved or cleared by the [place] FDA and has been authorized for detection and/or diagnosis of SARS-CoV-2 by FDA under an Emergency Use Authorization (EUA). This EUA will remain in effect (meaning  this test can be used) for the duration of the COVID-19 declaration under Section 564(b)(1) of the Act, 21 U.S.C. section 360bbb-3(b)(1),  unless the authorization is terminated or revoked.  Performed at [facility], [address] [place], [place] [address]   Urine Culture     Status: Abnormal   Collection Time: 07/07/22  7:21 PM   Specimen: Urine, Clean Catch  Result Value Ref Range Status   Specimen Description   Final    URINE, CLEAN CATCH Performed at [facility], [address], [place], [place] [address]    Special Requests   Final    NONE Performed at [facility], [address], [place], [place] [address]    Culture (A)  Final    <10,000 COLONIES/mL INSIGNIFICANT GROWTH Performed at [facility], [address], [place], [place] [address]    Report Status 07/08/2022 FINAL  Final  CSF culture w Gram Stain     Status: None (Preliminary result)   Collection Time: 07/08/22 12:30 PM   Specimen: CSF; Cerebrospinal Fluid  Result Value Ref Range Status   Specimen Description   Final    CSF Performed at [facility], [address], [place], [place] [address]    Special Requests   Final    NONE Performed at [facility], [address], [place], [place] [address]    Gram Stain   Final    WBC PRESENT,BOTH PMN AND MONONUCLEAR NO ORGANISMS SEEN CYTOSPIN Gram Stain Report Called to,Read Back By and Verified With: Derl Barrow RN @1425  ON 6.1.2024 BY [facility] Performed at [facility], [address] [place], [place] [address]    Culture   Final    NO GROWTH 3 DAYS Performed at [facility], [address], [place], [place] [address]    Report Status PENDING  Incomplete    Radiology Reports [place] FNA SALIVARY GLAND/PAROTID GLAND  Result Date: 07/11/2022 INDICATION: Indeterminate bilateral parotid cysts. Please perform ultrasound-guided aspiration for tissue diagnostic purposes. EXAM: 1. ULTRASOUND-GUIDED FINE-NEEDLE ASPIRATION OF RIGHT PAROTID CYST 2. ULTRASOUND-GUIDED FINE-NEEDLE ASPIRATION OF THE LEFT  PAROTID CYST COMPARISON:  Brain MRI-06/27/2022; cervical spine CT-01/30/2021 MEDICATIONS: None ANESTHESIA/SEDATION: None CONTRAST:  None COMPLICATIONS: None immediate. PROCEDURE: Informed written consent was obtained from the patient after a discussion of the risks, benefits and alternatives to treatment. Preprocedural ultrasound scanning demonstrated an approximately 4.4 x 2.6 cm minimally complex right-sided parotid cyst (image 4), as well as an approximately 3.3 x 2.0 cm left-sided parotid cyst (image 8). A timeout was performed prior to the initiation of the procedure. The skin overlying the operative sites were prepped and draped in usual sterile fashion. Attention was first paid towards aspiration of the right-sided parotid cyst. After the overlying soft tissues were anesthetized with 1% lidocaine with epinephrine, an 18 gauge trocar needle was advanced into the cyst. Multiple ultrasound images were saved procedural documentation purposes. Next, a proximally 25 cc of tan colored fluid was aspirated from the right parotid cyst. Postprocedural imaging was obtained demonstrating near complete resolution of the right-sided parotid cyst. The identical procedure was then performed left-sided parotid cyst yielding 10 cc of similar appearing tan colored fluid. Postprocedural imaging was obtained demonstrating near complete resolution of the left-sided parotid cyst. All aspirated fluid was capped and sent separately to the laboratory for analysis. Dressings were applied. The patient tolerated the procedure well without immediate postprocedural complication. IMPRESSION: 1. Successful ultrasound-guided aspiration of 25  cc of tan colored fluid from the right-sided parotid cyst. 2. Successful ultrasound-guided aspiration of 10 cc of tan colored fluid from the left-sided parotid cyst. 3. All aspirated fluid was capped and sent separately to the laboratory for analysis. Electronically Signed   By: Simonne Come M.D.   On:  07/11/2022 10:36   [place] FNA BIOPSY SALIVARY GLAND PAROTID GLAND EA ADDT'L LESION  Result Date: 07/11/2022 INDICATION: Indeterminate bilateral parotid cysts. Please perform ultrasound-guided aspiration for tissue diagnostic purposes. EXAM: 1. ULTRASOUND-GUIDED FINE-NEEDLE ASPIRATION OF RIGHT PAROTID CYST 2. ULTRASOUND-GUIDED FINE-NEEDLE ASPIRATION OF THE LEFT PAROTID CYST COMPARISON:  Brain MRI-06/27/2022; cervical spine CT-01/30/2021 MEDICATIONS: None ANESTHESIA/SEDATION: None CONTRAST:  None COMPLICATIONS: None immediate. PROCEDURE: Informed written consent was obtained from the patient after a discussion of the risks, benefits and alternatives to treatment. Preprocedural ultrasound scanning demonstrated an approximately 4.4 x 2.6 cm minimally complex right-sided parotid cyst (image 4), as well as an approximately 3.3 x 2.0 cm left-sided parotid cyst (image 8). A timeout was performed prior to the initiation of the procedure. The skin overlying the operative sites were prepped and draped in usual sterile fashion. Attention was first paid towards aspiration of the right-sided parotid cyst. After the overlying soft tissues were anesthetized with 1% lidocaine with epinephrine, an 18 gauge trocar needle was advanced into the cyst. Multiple ultrasound images were saved procedural documentation purposes. Next, a proximally 25 cc of tan colored fluid was aspirated from the right parotid cyst. Postprocedural imaging was obtained demonstrating near complete resolution of the right-sided parotid cyst. The identical procedure was then performed left-sided parotid cyst yielding 10 cc of similar appearing tan colored fluid. Postprocedural imaging was obtained demonstrating near complete resolution of the left-sided parotid cyst. All aspirated fluid was capped and sent separately to the laboratory for analysis. Dressings were applied. The patient tolerated the procedure well without immediate postprocedural complication.  IMPRESSION: 1. Successful ultrasound-guided aspiration of 25 cc of tan colored fluid from the right-sided parotid cyst. 2. Successful ultrasound-guided aspiration of 10 cc of tan colored fluid from the left-sided parotid cyst. 3. All aspirated fluid was capped and sent separately to the laboratory for analysis. Electronically Signed   By: Simonne Come M.D.   On: 07/11/2022 10:36   DG Lumbar Puncture Fluoro Guide  Result Date: 07/09/2022 CLINICAL DATA:  Left-sided weakness with concern for encephalitis. Request for image guided lumbar puncture. EXAM: LUMBAR PUNCTURE UNDER FLUOROSCOPY PROCEDURE: An appropriate skin entry site was determined fluoroscopically. Operator donned sterile gloves and mask. Skin site was marked, then prepped with Betadine, draped in usual sterile fashion, and infiltrated locally with 1% lidocaine. A 6 inch, 20 gauge spinal needle advanced into the thecal sac at L3-4 from a right interlaminar approach. Clear colorless CSF spontaneously returned, with opening pressure of 25 cm water. Approximately 13 ml CSF were collected and divided among 4 sterile vials for the requested laboratory studies. The needle was then removed. The patient tolerated the procedure well and there were no complications. FLUOROSCOPY: Radiation Exposure Index (as provided by the fluoroscopic device): 6.5 mGy Kerma IMPRESSION: Technically successful lumbar puncture under fluoroscopy. This exam was performed by Corrin Parker, [name], and was supervised and interpreted by Dr. Marliss Coots. Electronically Signed   By: Marliss Coots M.D.   On: 07/09/2022 18:03   MR BRAIN W CONTRAST  Result Date: 07/08/2022 CLINICAL DATA:  Headache, no red flags EXAM: MRI HEAD WITH CONTRAST TECHNIQUE: Multiplanar, multiecho pulse sequences of the brain and surrounding structures were obtained with  intravenous contrast. CONTRAST:  10mL GADAVIST GADOBUTROL 1 MMOL/ML IV SOLN COMPARISON:  MRI head 07/07/2022 FINDINGS: Axial T1 pre and postcontrast,  coronal T1 postcontrast, and coronal T2 sequences obtained. No abnormal parenchymal or meningeal enhancement. Redemonstrated areas of T1 hyperintense signal in the right greater than left insula and frontoparietal region. IMPRESSION: No abnormal parenchymal or meningeal enhancement. Electronically Signed   By: Wiliam Ke M.D.   On: 07/08/2022 20:32      Signature  -   Susa Raring M.D on 07/12/2022 at 9:54 AM   -  To page go to [email or web address]

## 2022-07-12 NOTE — Progress Notes (Signed)
Inpatient Rehabilitation Admissions Coordinator   Noted progress with therapy since 6/3. I will place rehab consult for full assessment of candidacy for possible Cir admit.  Ottie Glazier, RN, MSN Rehab Admissions Coordinator [phone] 07/12/2022 2:38 PM

## 2022-07-13 ENCOUNTER — Inpatient Hospital Stay (HOSPITAL_COMMUNITY): Payer: BC Managed Care – PPO

## 2022-07-13 ENCOUNTER — Other Ambulatory Visit (HOSPITAL_COMMUNITY): Payer: Self-pay

## 2022-07-13 DIAGNOSIS — B20 Human immunodeficiency virus [HIV] disease: Secondary | ICD-10-CM | POA: Diagnosis not present

## 2022-07-13 DIAGNOSIS — R569 Unspecified convulsions: Secondary | ICD-10-CM

## 2022-07-13 DIAGNOSIS — R531 Weakness: Secondary | ICD-10-CM | POA: Diagnosis not present

## 2022-07-13 DIAGNOSIS — Z21 Asymptomatic human immunodeficiency virus [HIV] infection status: Secondary | ICD-10-CM | POA: Diagnosis not present

## 2022-07-13 DIAGNOSIS — R29898 Other symptoms and signs involving the musculoskeletal system: Secondary | ICD-10-CM | POA: Diagnosis not present

## 2022-07-13 DIAGNOSIS — E1165 Type 2 diabetes mellitus with hyperglycemia: Secondary | ICD-10-CM | POA: Diagnosis not present

## 2022-07-13 DIAGNOSIS — A812 Progressive multifocal leukoencephalopathy: Secondary | ICD-10-CM | POA: Diagnosis not present

## 2022-07-13 DIAGNOSIS — R2689 Other abnormalities of gait and mobility: Secondary | ICD-10-CM | POA: Diagnosis not present

## 2022-07-13 LAB — BASIC METABOLIC PANEL
Anion gap: 9 (ref 5–15)
BUN: 7 mg/dL (ref 6–20)
CO2: 24 mmol/L (ref 22–32)
Calcium: 8.5 mg/dL — ABNORMAL LOW (ref 8.9–10.3)
Chloride: 104 mmol/L (ref 98–111)
Creatinine, Ser: 0.89 mg/dL (ref 0.61–1.24)
GFR, Estimated: >60 mL/min (ref >60)
Glucose, Bld: 99 mg/dL (ref 70–99)
Potassium: 3.7 mmol/L (ref 3.5–5.1)
Sodium: 137 mmol/L (ref 135–145)

## 2022-07-13 LAB — CBC WITH DIFFERENTIAL/PLATELET
Abs Immature Granulocytes: 0.01 10*3/uL (ref 0.00–0.07)
Basophils Absolute: 0 10*3/uL (ref 0.0–0.1)
Basophils Relative: 1 %
Eosinophils Absolute: 0.2 10*3/uL (ref 0.0–0.5)
Eosinophils Relative: 5 %
HCT: 37.2 % — ABNORMAL LOW (ref 39.0–52.0)
Hemoglobin: 12.4 g/dL — ABNORMAL LOW (ref 13.0–17.0)
Immature Granulocytes: 0 %
Lymphocytes Relative: 33 %
Lymphs Abs: 1.3 10*3/uL (ref 0.7–4.0)
MCH: 29 pg (ref 26.0–34.0)
MCHC: 33.3 g/dL (ref 30.0–36.0)
MCV: 87.1 fL (ref 80.0–100.0)
Monocytes Absolute: 0.3 10*3/uL (ref 0.1–1.0)
Monocytes Relative: 8 %
Neutro Abs: 2.1 10*3/uL (ref 1.7–7.7)
Neutrophils Relative %: 53 %
Platelets: 106 10*3/uL — ABNORMAL LOW (ref 150–400)
RBC: 4.27 MIL/uL (ref 4.22–5.81)
RDW: 13.4 % (ref 11.5–15.5)
WBC: 3.9 10*3/uL — ABNORMAL LOW (ref 4.0–10.5)
nRBC: 0 % (ref 0.0–0.2)

## 2022-07-13 LAB — GLUCOSE, CAPILLARY: Glucose-Capillary: 115 mg/dL — ABNORMAL HIGH (ref 70–99)

## 2022-07-13 LAB — MAGNESIUM: Magnesium: 2.1 mg/dL (ref 1.7–2.4)

## 2022-07-13 LAB — BRAIN NATRIURETIC PEPTIDE: B Natriuretic Peptide: 57 pg/mL (ref 0.0–100.0)

## 2022-07-13 MED ORDER — VALACYCLOVIR HCL 500 MG PO TABS
1000.0000 mg | ORAL_TABLET | Freq: Three times a day (TID) | ORAL | Status: DC
  Administered 2022-07-13 – 2022-07-14 (×4): 1000 mg via ORAL
  Filled 2022-07-13 (×6): qty 2

## 2022-07-13 MED ORDER — VALACYCLOVIR HCL 1 G PO TABS
1000.0000 mg | ORAL_TABLET | Freq: Three times a day (TID) | ORAL | 0 refills | Status: DC
  Filled 2022-07-13: qty 27, 9d supply, fill #0

## 2022-07-13 MED ORDER — BICTEGRAVIR-EMTRICITAB-TENOFOV 50-200-25 MG PO TABS
1.0000 | ORAL_TABLET | Freq: Every day | ORAL | 2 refills | Status: DC
  Filled 2022-07-13: qty 30, 30d supply, fill #0

## 2022-07-13 MED ORDER — SODIUM CHLORIDE 0.9 % IV BOLUS
500.0000 mL | Freq: Once | INTRAVENOUS | Status: AC
  Administered 2022-07-13: 500 mL via INTRAVENOUS

## 2022-07-13 MED ORDER — GUAIFENESIN-DM 100-10 MG/5ML PO SYRP
10.0000 mL | ORAL_SOLUTION | ORAL | Status: DC | PRN
  Administered 2022-07-13: 10 mL via ORAL
  Filled 2022-07-13: qty 10

## 2022-07-13 MED ORDER — SULFAMETHOXAZOLE-TRIMETHOPRIM 800-160 MG PO TABS
1.0000 | ORAL_TABLET | Freq: Every day | ORAL | 2 refills | Status: DC
  Filled 2022-07-13: qty 30, 30d supply, fill #0

## 2022-07-13 NOTE — Procedures (Signed)
Patient Name: Jose Lamb  MRN: [id number]  Epilepsy Attending: Charlsie Quest  Referring Physician/Provider: Leroy Sea, MD  Date: 07/13/2022 Duration: 23.28 mins  Patient history:  62 year old male with a 6-week history of increasing left-sided weakness and hemisensory deficits getting eeg to evaluate for seizure.  Level of alertness: Awake, asleep  AEDs during EEG study: None  Technical aspects: This EEG study was done with scalp electrodes positioned according to the 10-20 International system of electrode placement. Electrical activity was reviewed with band pass filter of 1-70Hz , sensitivity of 7 uV/mm, display speed of 61mm/sec with a 60Hz  notched filter applied as appropriate. EEG data were recorded continuously and digitally stored.  Video monitoring was available and reviewed as appropriate.  Description: The posterior dominant rhythm consists of 9 Hz activity of moderate voltage (25-35 uV) seen predominantly in posterior head regions, symmetric and reactive to eye opening and eye closing. Sleep was characterized by vertex waves, sleep spindles (12 to 14 Hz), maximal frontocentral region. Physiologic photic driving was seen during photic stimulation.  Hyperventilation was not performed.     IMPRESSION: This study is within normal limits. No seizures or epileptiform discharges were seen throughout the recording.  A normal interictal EEG does not exclude the diagnosis of epilepsy.  Ethyl Vila Annabelle Harman

## 2022-07-13 NOTE — Progress Notes (Signed)
Inpatient Rehab Coordinator Note:  I met with patient and his spouse, [name], at bedside to discuss CIR recommendations and goals/expectations of CIR stay.  We reviewed 3 hrs/day of therapy, physician follow up, and average length of stay 2 weeks (dependent upon progress) with goals of supervision to mod I.  Both confirm spouse is able to provide expected level of assist.  We reviewed need for insurance auth and I will start that process today.  Will follow for updates.   Estill Dooms, PT, DPT Admissions Coordinator [phone] 07/13/22  3:35 PM

## 2022-07-13 NOTE — Progress Notes (Signed)
At approximately 1335, pt's wife came out in the hall from pt's room and told this Clinical research associate, primary RN Leavy Cella, that "something is wrong with my husband." This writer came to pt's room, and assessed pt sitting up in recliner chair. Pt's wife Flo stated that "pt was sitting up in recliner chair, and started sweating, drooling, and shaking bilateral arms. Pt was also grunting and had his left arm up to his face, and suddenly left arm fell down beside him. This Clinical research associate notified charge RN Pryor Montes, rapid response RN Mitzi Davenport, and attending physician Susa Raring, MD. Pt's blood sugar was checked and was 115. BP was 91/67. Rapid RN Mitzi Davenport came at bedside at 1340, and assessed pt including a stroke assessment. Pt was helped back in bed by this Clinical research associate and [place]. Pt is alert and oriented x4 but, does not remember events while sitting in chair. Pt also had to use the bedpan when helped back in bed. BP rechecked was 128/98. Pt is in NAD at this time, and this writer will continue to monitor pt.

## 2022-07-13 NOTE — Plan of Care (Signed)
  Problem: Activity: Goal: Risk for activity intolerance will decrease Outcome: Not Progressing   Problem: Elimination: Goal: Will not experience complications related to urinary retention Outcome: Not Progressing   Problem: Pain Managment: Goal: General experience of comfort will improve Outcome: Not Progressing   Problem: Safety: Goal: Ability to remain free from injury will improve Outcome: Not Progressing

## 2022-07-13 NOTE — Progress Notes (Signed)
PROGRESS NOTE                                                                                                                                                                                                             Patient Demographics:    Jose Lamb, is a 62 y.o. male, DOB - 1960/03/05, ZHY:[id number]  Outpatient Primary MD for the patient is Ladon Applebaum    LOS - 6  Admit date - 07/07/2022    Chief Complaint  Patient presents with   Weakness       Brief Narrative (HPI from H&P)    62 y.o. male with medical history significant for prior CVA, paroxysmal atrial fibrillation not on oral anticoagulation, asthma, HIV with noncompliance, history of pulmonary embolism, who presents to [facility] ED with complaints of 6 weeks of left-sided weakness involving his left arm and left leg.  Associated with frequent falls.  Has not been taking his ART medications.  MRI of the brain showing right-sided T2 hypertense signal on the MRI primarily cortical/subcortical, in the right greater than left insula and postcentral gyrus, neurology and ID were consulted and he was admitted to the hospital for further care.   Subjective:   Patient in bed, appears comfortable, denies any headache, no fever, no chest pain or pressure, no shortness of breath , no abdominal pain. No new focal weakness. Improved +++ L Leg weakness   Assessment  & Plan :    Left-sided weakness leg significantly more weak than the arm.  MRI of the brain suggestive of T2 hypertense signal primarily cortical/subcortical, in the right greater than left insula and postcentral gyrus, neurology and ID on board, he was noncompliant with his HIV medications.  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   Continue supportive care with PT OT.  Left lower extremity weakness much improved since 07/12/2022, left upper extremity now minimally weak only.   HIV - AIDs.  CD4 count less than 54 with CD4 helper T-cell percentage 5%.  Noncompliant with medications, counseled on compliance, ID on board defer management to ID.  He is now agreeable to taking his HIV medications as prescribed, see above.  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  Per ID requested ENT input, per ENT CT guided biopsy by IR if required or else outpatient ENT follow-up.  Discussed with Dr. Murlean Hark and ID physician Dr. Gwynn Burly.  He is s/p CT-guided biopsy of both parotid glands by IR on 07/10/2022, bilateral parotid gland FNAC done by IR appears unremarkable, prelim results noted on 07/12/2022, CSF autoimmune panel appears negative, CSF flow cytometry appears stable as well.  Dyslipidemia.  Placed on statin.  Asthma.  Stable no acute issues.  Obesity.  BMI 32.  Follow-up with PCP for weight loss.      Condition -   Guarded  Family Communication  : Updated wife on 07/11/2022  Code Status :  Full  Consults  :  ID, Neuro, ENT, IR  PUD Prophylaxis :    Procedures  :     MRI - 1. Abnormal T2 hyperintense signal, primarily cortical/subcortical, in the right greater than left insula and postcentral gyrus, as well as in the splenium and left body of the corpus callosum and possibly the right thalamus and midbrain. This is nonspecific, and while this could reflect sequela of prior infarcts, it is also concerning for an inflammatory or infectious process, such as PML, autoimmune encephalitis, or herpes simplex encephalitis, although this is not particularly typical of any the entities. MRI with contrast could be helpful. Correlate with symptoms and consider CSF testing. 2. Bilateral parotid masses are better seen on the prior CT and are incompletely imaged on this exam.      Disposition Plan  :       DVT Prophylaxis  :    heparin injection 5,000 Units Start: 07/08/22 1400    Lab Results  Component Value Date   PLT 106 (L) 07/13/2022    Diet :  Diet Order             Diet regular Room service appropriate? Yes; Fluid consistency: Thin  Diet effective now                    Inpatient Medications  Scheduled Meds:  bictegravir-emtricitabine-tenofovir AF  1 tablet Oral Daily   vitamin B-12  2,000 mcg Oral Daily   heparin  5,000 Units Subcutaneous Q8H   rosuvastatin  10 mg Oral Daily   sulfamethoxazole-trimethoprim  1 tablet Oral Daily   Continuous Infusions:  acyclovir 870 mg (07/13/22 0412)   lactated ringers 50 mL/hr at 07/13/22 0418   PRN Meds:.acetaminophen, albuterol, HYDROmorphone (DILAUDID) injection, mouth rinse, oxyCODONE, polyethylene glycol, prochlorperazine  Antibiotics  :    Anti-infectives (From admission, onward)    Start     Dose/Rate Route Frequency Ordered Stop   07/10/22 1615  bictegravir-emtricitabine-tenofovir AF (BIKTARVY) 50-200-25 MG per tablet 1 tablet        1 tablet Oral Daily 07/10/22 1515     07/10/22 1615  sulfamethoxazole-trimethoprim (BACTRIM DS) 800-160 MG per tablet 1 tablet        1 tablet Oral Daily 07/10/22 1522     07/08/22 2315  acyclovir (ZOVIRAX) 1,000 mg in dextrose 5 % 250 mL IVPB  Status:  Discontinued        1,000 mg 270 mL/hr over 60 Minutes Intravenous Every 8 hours 07/08/22 2221 07/08/22 2229   07/08/22 2315  acyclovir (ZOVIRAX) 870 mg in dextrose 5 % 250 mL IVPB        10 mg/kg  86.9 kg (Adjusted) 267.4 mL/hr over 60 Minutes Intravenous  Every 8 hours 07/08/22 2229           Objective:   Vitals:   07/12/22 2328 07/13/22 0312 07/13/22 0400 07/13/22 0942  BP: 134/81 123/81    Pulse: 79 69    Resp: (!) 22 (!) 24    Temp: 98.9 F (37.2 C) 98.9 F (37.2 C)    TempSrc: Axillary Oral  Oral  SpO2: 98% (!) 77% 93%   Weight:      Height:        Wt Readings from Last 3 Encounters:  07/07/22 104.3 kg   07/05/22 104.3 kg  04/08/22 106.7 kg     Intake/Output Summary (Last 24 hours) at 07/13/2022 0955 Last data filed at 07/13/2022 0412 Gross per 24 hour  Intake 1273.1 ml  Output 1500 ml  Net -226.9 ml     Physical Exam  Awake Alert, No new F.N deficits, L leg 2/5, L arm mildly weak [place].AT,PERRAL Supple Neck, No JVD,   Symmetrical Chest wall movement, Good air movement bilaterally, CTAB RRR,No Gallops,Rubs or new Murmurs,  +ve B.Sounds, Abd Soft, No tenderness,   No Cyanosis, Clubbing or edema        Data Review:    Recent Labs  Lab 07/07/22 1819 07/07/22 1841 07/08/22 0500 07/11/22 0216 07/12/22 0630 07/13/22 0709  WBC 4.1  --  3.9* 5.3 3.6* 3.9*  HGB 13.1 12.9* 12.1* 12.5* 12.1* 12.4*  HCT 39.9 38.0* 37.7* 37.7* 36.4* 37.2*  PLT 112*  --  108* 97* 92* 106*  MCV 91.5  --  91.7 87.1 87.1 87.1  MCH 30.0  --  29.4 28.9 28.9 29.0  MCHC 32.8  --  32.1 33.2 33.2 33.3  RDW 14.4  --  14.4 13.6 13.5 13.4  LYMPHSABS 1.0  --   --  1.4 1.1 1.3  MONOABS 0.6  --   --  0.6 0.3 0.3  EOSABS 0.2  --   --  0.0 0.2 0.2  BASOSABS 0.0  --   --  0.0 0.0 0.0    Recent Labs  Lab 07/07/22 1819 07/07/22 1841 07/08/22 0500 07/08/22 1405 07/09/22 0323 07/10/22 0546 07/11/22 0216 07/12/22 0630 07/13/22 0709  NA 135   < > 138  --  136 134* 132* 134* 137  K 3.4*   < > 3.2*  --  3.6 3.8 3.8 3.6 3.7  CL 102   < > 105  --  104 102 99 102 104  CO2 25  --  28  --  24 26 22 23 24   ANIONGAP 8  --  5  --  8 6 11 9 9   GLUCOSE 95   < > 104*  --  100* 99 111* 119* 99  BUN 14   < > 13  --  10 7 7 7 7   CREATININE 0.78   < > 0.79  --  0.77 0.75 0.83 0.87 0.89  AST 21  --   --   --   --   --   --   --   --   ALT 24  --   --   --   --   --   --   --   --   ALKPHOS 67  --   --   --   --   --   --   --   --   BILITOT 0.6  --   --   --   --   --   --   --   --  ALBUMIN 3.6  --   --  NOT PERFORMED  --   --   --   --   --   INR 1.0  --   --   --   --  1.1  --   --   --   BNP  --   --   --   --    --   --  70.9 31.9 57.0  MG  --   --  2.3  --   --   --  2.0 2.3 2.1  CALCIUM 8.4*  --  8.6*  --  8.3* 8.6* 8.6* 8.4* 8.5*   < > = values in this interval not displayed.      Recent Labs  Lab 07/07/22 1819 07/08/22 0500 07/09/22 0323 07/10/22 0546 07/11/22 0216 07/12/22 0630 07/13/22 0709  INR 1.0  --   --  1.1  --   --   --   BNP  --   --   --   --  70.9 31.9 57.0  MG  --  2.3  --   --  2.0 2.3 2.1  CALCIUM 8.4* 8.6* 8.3* 8.6* 8.6* 8.4* 8.5*     Lab Results  Component Value Date   CHOL 197 04/10/2022   HDL 39 (L) 04/10/2022   LDLCALC 126 (H) 04/10/2022   TRIG 158 (H) 04/10/2022   CHOLHDL 5.1 04/10/2022    Lab Results  Component Value Date   HGBA1C 6.6 (H) 04/10/2022      Micro Results Recent Results (from the past 240 hour(s))  Urine Culture     Status: Abnormal   Collection Time: 07/05/22  9:19 AM   Specimen: Urine, Clean Catch  Result Value Ref Range Status   Specimen Description   Final    URINE, CLEAN CATCH Performed at [facility], [address] [place], [place] [address]    Special Requests   Final    NONE Reflexed from [id number] Performed at [facility], [address], [place], [place] [address]    Culture MULTIPLE SPECIES PRESENT, SUGGEST RECOLLECTION (A)  Final   Report Status 07/06/2022 FINAL  Final  Blood Culture (routine x 2)     Status: None   Collection Time: 07/05/22  9:46 AM   Specimen: Left Antecubital; Blood  Result Value Ref Range Status   Specimen Description LEFT ANTECUBITAL  Final   Special Requests   Final    BOTTLES DRAWN AEROBIC AND ANAEROBIC Blood Culture results may not be optimal due to an excessive volume of blood received in culture bottles   Culture   Final    NO GROWTH 6 DAYS Performed at [facility], [address] [place], [place] [address]    Report Status 07/11/2022 FINAL  Final  Blood Culture (routine x 2)     Status: None   Collection Time: 07/05/22  9:46 AM   Specimen: BLOOD RIGHT ARM  Result Value Ref  Range Status   Specimen Description BLOOD RIGHT ARM  Final   Special Requests   Final    BOTTLES DRAWN AEROBIC AND ANAEROBIC Blood Culture results may not be optimal due to an excessive volume of blood received in culture bottles   Culture   Final    NO GROWTH 6 DAYS Performed at [facility], [address], [place], [place] [address]    Report Status 07/11/2022 FINAL  Final  Resp panel by RT-PCR (RSV, Flu A&B, Covid) Anterior Nasal Swab  Status: None   Collection Time: 07/05/22  9:47 AM   Specimen: Anterior Nasal Swab  Result Value Ref Range Status   SARS Coronavirus 2 by RT PCR NEGATIVE NEGATIVE Final    Comment: (NOTE) SARS-CoV-2 target nucleic acids are NOT DETECTED.  The SARS-CoV-2 RNA is generally detectable in upper respiratory specimens during the acute phase of infection. The lowest concentration of SARS-CoV-2 viral copies this assay can detect is 138 copies/mL. A negative result does not preclude SARS-Cov-2 infection and should not be used as the sole basis for treatment or other patient management decisions. A negative result may occur with  improper specimen collection/handling, submission of specimen other than nasopharyngeal swab, presence of viral mutation(s) within the areas targeted by this assay, and inadequate number of viral copies(<138 copies/mL). A negative result must be combined with clinical observations, patient history, and epidemiological information. The expected result is Negative.  Fact Sheet for Patients:  BloggerCourse.com  Fact Sheet for Healthcare Providers:  SeriousBroker.it  This test is no t yet approved or cleared by the [place] FDA and  has been authorized for detection and/or diagnosis of SARS-CoV-2 by FDA under an Emergency Use Authorization (EUA). This EUA will remain  in effect (meaning this test can be used) for the duration of the COVID-19 declaration under Section 564(b)(1)  of the Act, 21 U.S.C.section 360bbb-3(b)(1), unless the authorization is terminated  or revoked sooner.       Influenza A by PCR NEGATIVE NEGATIVE Final   Influenza B by PCR NEGATIVE NEGATIVE Final    Comment: (NOTE) The Xpert Xpress SARS-CoV-2/FLU/RSV plus assay is intended as an aid in the diagnosis of influenza from Nasopharyngeal swab specimens and should not be used as a sole basis for treatment. Nasal washings and aspirates are unacceptable for Xpert Xpress SARS-CoV-2/FLU/RSV testing.  Fact Sheet for Patients: BloggerCourse.com  Fact Sheet for Healthcare Providers: SeriousBroker.it  This test is not yet approved or cleared by the [place] FDA and has been authorized for detection and/or diagnosis of SARS-CoV-2 by FDA under an Emergency Use Authorization (EUA). This EUA will remain in effect (meaning this test can be used) for the duration of the COVID-19 declaration under Section 564(b)(1) of the Act, 21 U.S.C. section 360bbb-3(b)(1), unless the authorization is terminated or revoked.     Resp Syncytial Virus by PCR NEGATIVE NEGATIVE Final    Comment: (NOTE) Fact Sheet for Patients: BloggerCourse.com  Fact Sheet for Healthcare Providers: SeriousBroker.it  This test is not yet approved or cleared by the [place] FDA and has been authorized for detection and/or diagnosis of SARS-CoV-2 by FDA under an Emergency Use Authorization (EUA). This EUA will remain in effect (meaning this test can be used) for the duration of the COVID-19 declaration under Section 564(b)(1) of the Act, 21 U.S.C. section 360bbb-3(b)(1), unless the authorization is terminated or revoked.  Performed at [facility], [address] [place], [place] [address]   Urine Culture     Status: Abnormal   Collection Time: 07/07/22  7:21 PM   Specimen: Urine, Clean Catch  Result Value Ref Range  Status   Specimen Description   Final    URINE, CLEAN CATCH Performed at [facility], [address], [place], [place] [address]    Special Requests   Final    NONE Performed at [facility], [address] [place], [place] [address]    Culture (A)  Final    <10,000 COLONIES/mL INSIGNIFICANT GROWTH Performed at [facility], [address], [place], [place] [address]    Report Status 07/08/2022 FINAL  Final  CSF culture w Gram Stain     Status: None   Collection Time: 07/08/22 12:30 PM   Specimen: CSF; Cerebrospinal Fluid  Result Value Ref Range Status   Specimen Description   Final    CSF Performed at [facility], [address], [place], [place] [address]    Special Requests   Final    NONE Performed at [facility], [address], [place], [place] [address]    Gram Stain   Final    WBC PRESENT,BOTH PMN AND MONONUCLEAR NO ORGANISMS SEEN CYTOSPIN Gram Stain Report Called to,Read Back By and Verified With: Derl Barrow RN @1425  ON 6.1.2024 BY [facility] Performed at [facility], [address] [place], [place] [address]    Culture   Final    NO GROWTH 3 DAYS Performed at [facility], [address], [place], [place] [address]    Report Status 07/12/2022 FINAL  Final    Radiology Reports [place] FNA SALIVARY GLAND/PAROTID GLAND  Result Date: 07/11/2022 INDICATION: Indeterminate bilateral parotid cysts. Please perform ultrasound-guided aspiration for tissue diagnostic purposes. EXAM: 1. ULTRASOUND-GUIDED FINE-NEEDLE ASPIRATION OF RIGHT PAROTID CYST 2. ULTRASOUND-GUIDED FINE-NEEDLE ASPIRATION OF THE LEFT PAROTID CYST COMPARISON:  Brain MRI-06/27/2022; cervical spine CT-01/30/2021 MEDICATIONS: None ANESTHESIA/SEDATION: None CONTRAST:  None COMPLICATIONS: None immediate. PROCEDURE: Informed written consent was obtained from the patient after a discussion of the risks, benefits  and alternatives to treatment. Preprocedural ultrasound scanning demonstrated an approximately 4.4 x 2.6 cm minimally complex right-sided parotid cyst (image 4), as well as an approximately 3.3 x 2.0 cm left-sided parotid cyst (image 8). A timeout was performed prior to the initiation of the procedure. The skin overlying the operative sites were prepped and draped in usual sterile fashion. Attention was first paid towards aspiration of the right-sided parotid cyst. After the overlying soft tissues were anesthetized with 1% lidocaine with epinephrine, an 18 gauge trocar needle was advanced into the cyst. Multiple ultrasound images were saved procedural documentation purposes. Next, a proximally 25 cc of tan colored fluid was aspirated from the right parotid cyst. Postprocedural imaging was obtained demonstrating near complete resolution of the right-sided parotid cyst. The identical procedure was then performed left-sided parotid cyst yielding 10 cc of similar appearing tan colored fluid. Postprocedural imaging was obtained demonstrating near complete resolution of the left-sided parotid cyst. All aspirated fluid was capped and sent separately to the laboratory for analysis. Dressings were applied. The patient tolerated the procedure well without immediate postprocedural complication. IMPRESSION: 1. Successful ultrasound-guided aspiration of 25 cc of tan colored fluid from the right-sided parotid cyst. 2. Successful ultrasound-guided aspiration of 10 cc of tan colored fluid from the left-sided parotid cyst. 3. All aspirated fluid was capped and sent separately to the laboratory for analysis. Electronically Signed   By: Simonne Come M.D.   On: 07/11/2022 10:36   [place] FNA BIOPSY SALIVARY GLAND PAROTID GLAND EA ADDT'L LESION  Result Date: 07/11/2022 INDICATION: Indeterminate bilateral parotid cysts. Please perform ultrasound-guided aspiration for tissue diagnostic purposes. EXAM: 1. ULTRASOUND-GUIDED FINE-NEEDLE  ASPIRATION OF RIGHT PAROTID CYST 2. ULTRASOUND-GUIDED FINE-NEEDLE ASPIRATION OF THE LEFT PAROTID CYST COMPARISON:  Brain MRI-06/27/2022; cervical spine CT-01/30/2021 MEDICATIONS: None ANESTHESIA/SEDATION: None CONTRAST:  None COMPLICATIONS: None immediate. PROCEDURE: Informed written consent was obtained from the patient after a discussion of the risks, benefits and alternatives to treatment. Preprocedural ultrasound  scanning demonstrated an approximately 4.4 x 2.6 cm minimally complex right-sided parotid cyst (image 4), as well as an approximately 3.3 x 2.0 cm left-sided parotid cyst (image 8). A timeout was performed prior to the initiation of the procedure. The skin overlying the operative sites were prepped and draped in usual sterile fashion. Attention was first paid towards aspiration of the right-sided parotid cyst. After the overlying soft tissues were anesthetized with 1% lidocaine with epinephrine, an 18 gauge trocar needle was advanced into the cyst. Multiple ultrasound images were saved procedural documentation purposes. Next, a proximally 25 cc of tan colored fluid was aspirated from the right parotid cyst. Postprocedural imaging was obtained demonstrating near complete resolution of the right-sided parotid cyst. The identical procedure was then performed left-sided parotid cyst yielding 10 cc of similar appearing tan colored fluid. Postprocedural imaging was obtained demonstrating near complete resolution of the left-sided parotid cyst. All aspirated fluid was capped and sent separately to the laboratory for analysis. Dressings were applied. The patient tolerated the procedure well without immediate postprocedural complication. IMPRESSION: 1. Successful ultrasound-guided aspiration of 25 cc of tan colored fluid from the right-sided parotid cyst. 2. Successful ultrasound-guided aspiration of 10 cc of tan colored fluid from the left-sided parotid cyst. 3. All aspirated fluid was capped and sent  separately to the laboratory for analysis. Electronically Signed   By: Simonne Come M.D.   On: 07/11/2022 10:36      Signature  -   Susa Raring M.D on 07/13/2022 at 9:55 AM   -  To page go to [email or web address]

## 2022-07-13 NOTE — Progress Notes (Signed)
Physical Therapy Treatment Patient Details Name: Jose Lamb MRN: [id number] DOB: 06-20-1960 Today's Date: 07/13/2022   History of Present Illness Giles Shayne is a 62 y.o. male who presents with 6 weeks of left-sided weakness and frequent falls. MRI brain revealed concern for inflammatory or infectious process. PMHx: CVA, paroxysmal atrial fibrillation not on oral anticoagulation, asthma, HIV with noncompliance, history of pulmonary embolism.    PT Comments    Pt tolerated today's session well, focused on transfers and standing balance. Pt able to transfer from bed to chair to [facility] and back to chair with mod-maxA. Pt continues to have a posterior and left lateral lean, requiring increased assist to transfer towards his right side. Pt reliant on surface behind his legs for balance upon initial rise, cueing for maintaining RW on the ground and finding midline with standing. Pt performing lateral weight shifts, cued for full body weight shift as pt leaning with shoulders and maintaining hips in place, pt able to correct and improving with repetition. Discharge plans remain appropriate, pt and his wife both agreeable to high intensity PT to maximize independence with mobility. Acute PT will continue to follow during admission.     Recommendations for follow up therapy are one component of a multi-disciplinary discharge planning process, led by the attending physician.  Recommendations may be updated based on patient status, additional functional criteria and insurance authorization.  Follow Up Recommendations       Assistance Recommended at Discharge Frequent or constant Supervision/Assistance  Patient can return home with the following Two people to help with walking and/or transfers;A lot of help with bathing/dressing/bathroom;Assistance with cooking/housework;Assist for transportation;Help with stairs or ramp for entrance   Equipment Recommendations   (TBD)    Recommendations for Other  Services       Precautions / Restrictions Precautions Precautions: Fall Precaution Comments: Lt hemi Restrictions Weight Bearing Restrictions: No     Mobility  Bed Mobility Overal bed mobility: Needs Assistance Bed Mobility: Supine to Sit     Supine to sit: Min assist, HOB elevated     General bed mobility comments: HOB elevated and use of bed rail, minA for trunk support. Increased time to complete, with mild cues to fully scoot to the EOB    Transfers Overall transfer level: Needs assistance Equipment used: Rolling walker (2 wheels), 1 person hand held assist Transfers: Sit to/from Stand, Bed to chair/wheelchair/BSC Sit to Stand: Mod assist, Min assist, From elevated surface   Step pivot transfers: Mod assist, Max assist, From elevated surface       General transfer comment: minA to stand from elevated bed, with HHA, standing from chair x5 reps and BSC x1 rep, ranging from min-modA for power up and steady with trials. Performing step pivot transfer to chair, BSC, and back to chair with mod to maxA. modA when transferring to the L with maxA needed for R transfer for balance and sequencing. Cued for proper hand placement and technique with each transfer. Pt continues with left lateral lean, utilizing surface behind BLE for balance    Ambulation/Gait             Pre-gait activities: lateral weight shifts trying to find midline, focused on maintaining RW on the ground General Gait Details: not attempted today, focused on weight shift in standing to decrease left lateral lean   Stairs             Wheelchair Mobility    Modified Rankin (Stroke Patients Only)  Balance Overall balance assessment: Needs assistance Sitting-balance support: Feet supported, Single extremity supported Sitting balance-Leahy Scale: Fair Sitting balance - Comments: sitting EOB Postural control: Posterior lean, Left lateral lean Standing balance support: During functional  activity, Reliant on assistive device for balance, Bilateral upper extremity supported Standing balance-Leahy Scale: Poor Standing balance comment: reliant on RW and external support                            Cognition Arousal/Alertness: Awake/alert Behavior During Therapy: Flat affect Overall Cognitive Status: Within Functional Limits for tasks assessed                                 General Comments: pt pleasant throughout session, HOH but following commands once he hears        Exercises      General Comments General comments (skin integrity, edema, etc.): VSS on room air, wife present throughout session      Pertinent Vitals/Pain Pain Assessment Pain Assessment: Faces Faces Pain Scale: No hurt    Home Living                          Prior Function            PT Goals (current goals can now be found in the care plan section) Acute Rehab PT Goals Patient Stated Goal: regain function and independence PT Goal Formulation: With patient Time For Goal Achievement: 07/23/22 Potential to Achieve Goals: Good Progress towards PT goals: Progressing toward goals    Frequency    Min 4X/week      PT Plan Current plan remains appropriate    Co-evaluation              AM-PAC PT "6 Clicks" Mobility   Outcome Measure  Help needed turning from your back to your side while in a flat bed without using bedrails?: A Little Help needed moving from lying on your back to sitting on the side of a flat bed without using bedrails?: A Little Help needed moving to and from a bed to a chair (including a wheelchair)?: A Lot Help needed standing up from a chair using your arms (e.g., wheelchair or bedside chair)?: A Lot Help needed to walk in hospital room?: Total Help needed climbing 3-5 steps with a railing? : Total 6 Click Score: 12    End of Session Equipment Utilized During Treatment: Gait belt Activity Tolerance: Patient tolerated  treatment well Patient left: in chair;with call bell/phone within reach;with family/visitor present Nurse Communication: Mobility status PT Visit Diagnosis: Unsteadiness on feet (R26.81);Other abnormalities of gait and mobility (R26.89);Ataxic gait (R26.0);Muscle weakness (generalized) (M62.81);Hemiplegia and hemiparesis;Other symptoms and signs involving the nervous system (R29.898);Difficulty in walking, not elsewhere classified (R26.2) Hemiplegia - Right/Left: Left Hemiplegia - dominant/non-dominant: Non-dominant Hemiplegia - caused by: Unspecified     Time: 1610-9604 PT Time Calculation (min) (ACUTE ONLY): 38 min  Charges:  $Therapeutic Activity: 38-52 mins                     Lindalou Hose, PT DPT Acute Rehabilitation Services Office [phone]    Leonie Man 07/13/2022, 2:18 PM

## 2022-07-13 NOTE — Significant Event (Addendum)
Rapid Response Event Note   Reason for Call :  Pt had been up to chair for approximately 1 hour. Wife was sitting with him when she noted his full body twitched, he became diaphoretic and his eyes rolled looking upward.   Brief loss of consciousness. Pt does not recall above event.   Initial Focused Assessment:  Pt sitting in chair, AO. EOMI. Visual fields intact. Follows commands, Face symmetrical. Upper extremity strength equal. Left lower extremity weaker than right. Decreased sensation on left leg. Speech clear.   VS sitting: T97.22F, BP 91/67, HR 79, RR 22, SpO2 92% on room air  VS lying: 128/98, HR 88, RR 23, SpO2 94% on room air  Pt endorses needing to have a bowel movement. Bedpan provided.   Interventions:  -CBG: 115 -Pt back to bed  Plan of Care:  Per MD: -IVF bolus -TED hose -EEG  Event Summary:  MD Notified: Dr. Thedore Mins Call Time: 1338 Arrival Time: 1340 End Time: 1405  Jennye Moccasin, RN

## 2022-07-13 NOTE — Consult Note (Addendum)
Physical Medicine and Rehabilitation Consult Reason for Consult:left sided weakness Referring Physician: Thedore Mins   HPI: Jose Lamb is a 62 y.o. male with a history of a prior CVA as well as paroxysmal atrial fibrillation (not on a/c), asthma, HIV with noncompliance, pulmonary embolism who presented on 07/07/2022 with a 6-week history of increasing left-sided weakness of his arm and leg.  He was having frequent falls as well.  Patient reportedly was not taking his meds for HIV.  MRI of the brain was done at the time which demonstrated T2 signal hyperintensity in the right greater than left insula and postcentral gyrus.  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  Patient currently is on Biktarvy and Bactrim per infectious disease.  Most recently he has been up with therapy and has been to mod assist for sit to stand transfers +2 he has walked 30 feet mod assist +2 using a rolling walker.  Prior to approximately 6 weeks ago patient was independent without a device and working.  Over the last 6 weeks he is required increasing assistance from his spouse for mobility and basic self-care activities.  He will discharge home with his spouse to a multilevel home with 2 steps to enter.  He can stay on the first level which has a bedroom and bathroom.   Review of Systems  Constitutional:  Negative for chills and fever.  HENT: Negative.    Eyes:  Negative for blurred vision and double vision.  Respiratory:  Negative for cough.   Cardiovascular:  Negative for chest pain.  Gastrointestinal:  Negative for nausea and vomiting.  Genitourinary:  Negative for dysuria.  Musculoskeletal:  Positive for falls. Negative for myalgias.  Skin:  Negative for rash.  Neurological:  Positive for sensory change, focal weakness and weakness.  Psychiatric/Behavioral:   Negative for hallucinations.    Past Medical History:  Diagnosis Date   Atrial fibrillation (HCC)    In the setting of hyperthyroidism   Bilateral pulmonary embolism [facility])    Diagnosed August 2021   Diabetes mellitus without complication (HCC)    Essential hypertension    HIV antibody positive (HCC)    Perforated duodenal ulcer (HCC) 2015   Contained without need for surgical intervention   Pneumonia due to COVID-19 virus    Diagnosed August 2021   Past Surgical History:  Procedure Laterality Date   COLONOSCOPY  Sept 2015   [place]: transverse colon polyp with pathology reporting lymphoid nodule    ESOPHAGOGASTRODUODENOSCOPY N/A 03/30/2014   Procedure: ESOPHAGOGASTRODUODENOSCOPY (EGD);  Surgeon: Corbin Ade, MD;  Location: [facility] ENDO SUITE;  Service: Endoscopy;  Laterality: N/A;  215   UMBILICAL HERNIA REPAIR N/A 12/16/2021   Procedure: HERNIA REPAIR UMBILICAL ADULT WITH MESH;  Surgeon: Franky Macho, MD;  Location: [facility] ORS;  Service: General;  Laterality: N/A;   Family History  Problem Relation Age of Onset   Ulcers Mother    Heart Problems Mother    Colon cancer Neg Hx    Social History:  reports that he has never smoked. He has never used smokeless tobacco. He reports that he does not drink alcohol and does not use drugs. Allergies:  Allergies  Allergen Reactions   Asa [Aspirin] Other (See Comments)    Nose bleeds   Medications Prior to Admission  Medication Sig Dispense Refill  albuterol (PROVENTIL) (2.5 MG/3ML) 0.083% nebulizer solution Take 2.5 mg by nebulization every 4 (four) hours.     albuterol (VENTOLIN HFA) 108 (90 Base) MCG/ACT inhaler 1 puff every 4 (four) hours as needed for wheezing or shortness of breath.     ascorbic acid (VITAMIN C) 500 MG tablet Take 1 tablet (500 mg total) by mouth daily. 30 tablet 1   cephALEXin (KEFLEX) 500 MG capsule Take 1 capsule (500 mg total) by mouth 4 (four) times daily. 28 capsule 0   oxyCODONE-acetaminophen (PERCOCET)  10-325 MG tablet Take 1 tablet by mouth every 6 (six) hours.      Home: Home Living Family/patient expects to be discharged to:: Private residence Living Arrangements: Spouse/significant other, Children, Other relatives Available Help at Discharge: Family Type of Home: House Home Access: Stairs to enter Secretary/administrator of Steps: 2 Entrance Stairs-Rails: None (nephew planning to build rails) Home Layout: Multi-level, Laundry or work area in basement, [employer] on main level, Able to live on main level with bedroom/bathroom Bathroom Shower/Tub: Engineer, manufacturing systems: Standard Bathroom Accessibility: Yes Home Equipment: Wheelchair - power, Medical laboratory scientific officer - single point Additional Comments: PTA and ~ last 6 weeks pt was fully independent and 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 power wc is from his wife when she was recovering from her cancer treatments. he has had to use it in the last ~6 weeks due to weakness.  Functional History: Prior Function Prior Level of Function : Independent/Modified Independent, Needs assist, Driving, Working/employed, History of Falls (last six months) Physical Assist : Mobility (physical), ADLs (physical) Mobility (physical): Transfers, Gait ADLs (physical): Bathing, Dressing, Toileting Mobility Comments: prior to ~6 weeks ago pt was independent with no device and working full time driving/operating large equipment. Over last 6 weeks pt has had worsening weakness in Lt side and decreased sensation, pt reports at least 8 falls. He has a SPC he has been using for some ambulation in home and power WC. pt reports he has no awareness /sense of Lt foot location and indicates he may have caught his foot under the chair. ADLs Comments: prior to ~6 weeks ago pt was independent with no AE required. Over last 6 weeks pt has required assits for transfers on/off toilet from spouse, is no longer showering due to fall in bathtub and is having assist from spouse for  sink/sponge baths. Functional Status:  Mobility: Bed Mobility Overal bed mobility: Needs Assistance Bed Mobility: Supine to Sit Supine to sit: Mod assist Sit to supine: Max assist General bed mobility comments: trunk and LLE management Transfers Overall transfer level: Needs assistance Equipment used: Rolling walker (2 wheels) Transfers: Sit to/from Stand, Bed to chair/wheelchair/BSC Sit to Stand: Mod assist, +2 physical assistance, +2 safety/equipment, Min assist Bed to/from chair/wheelchair/BSC transfer type:: Step pivot, Stand pivot Stand pivot transfers: Max assist, +2 physical assistance Step pivot transfers: Total assist, +2 physical assistance General transfer comment: mod A +2 to power up from EOB on initial stand, down to min A +2 with repetitive stands throughout session, pt continues to require increased time and cues for hand placement, mod A +2 to step pivot with HHA x2 EOB>BSC, min A +2 with RW support to step pivot BSC>EOB. Ambulation/Gait Ambulation/Gait assistance: Mod assist, +2 physical assistance, +2 safety/equipment Gait Distance (Feet): 30 Feet Assistive device: Rolling walker (2 wheels) Gait Pattern/deviations: Step-to pattern, Decreased stride length, Decreased stance time - right, Decreased step length - left, Drifts right/left, Trunk flexed General Gait Details: mod A +2  with RW support, frequent multimodal cues and standing breaks to correct L lateral lean, pt able to self advance LLE this session, however pt demonstrating poor eccentric control, pt continues to fatigue quickly with lateral lean progressing with fatigue, pt needing freguent cues to remain focused on task at hand Gait velocity: decr Pre-gait activities: single leg marching with L x10, single leg marching with R x5 with max A to maintain upright and midline posture    ADL: ADL Overall ADL's : Needs assistance/impaired Eating/Feeding: Set up, Sitting Grooming: Wash/dry face, Sitting,  Supervision/safety Grooming Details (indicate cue type and reason): sitting EOB with unilateral UE support Upper Body Bathing: Set up, Sitting Lower Body Bathing: Moderate assistance, +2 for physical assistance, +2 for safety/equipment, Sit to/from stand Upper Body Dressing : Min guard, Sitting Lower Body Dressing: Maximal assistance, +2 for physical assistance, +2 for safety/equipment, Sit to/from stand Toilet Transfer: Moderate assistance, +2 for safety/equipment, +2 for physical assistance, Stand-pivot, BSC/3in1 Toilet Transfer Details (indicate cue type and reason): transfer from [facility] towards R Toileting- Clothing Manipulation and Hygiene: Total assistance, +2 for physical assistance, +2 for safety/equipment, Sit to/from stand Toileting - Clothing Manipulation Details (indicate cue type and reason): total A for pericare post BM, required BUE support on RW for stability during task Functional mobility during ADLs: Moderate assistance, +2 for physical assistance, +2 for safety/equipment, Rolling walker (2 wheels) General ADL Comments: limited by L hemiplegia and decreased activity tolerance  Cognition: Cognition Overall Cognitive Status: Within Functional Limits for tasks assessed Orientation Level: Oriented X4 Cognition Arousal/Alertness: Awake/alert Behavior During Therapy: Flat affect Overall Cognitive Status: Within Functional Limits for tasks assessed General Comments: Pleasant and cooperative  Blood pressure 123/81, pulse 69, temperature 98.9 F (37.2 C), temperature source Oral, resp. rate (!) 24, height 5\' 11"  (1.803 m), weight 104.3 kg, SpO2 93 %. Physical Exam Constitutional:      General: He is not in acute distress.    Appearance: He is obese.  HENT:     Head: Normocephalic and atraumatic.     Nose: Nose normal.     Mouth/Throat:     Mouth: Mucous membranes are moist.  Eyes:     Extraocular Movements: Extraocular movements intact.     Pupils: Pupils are equal,  round, and reactive to light.  Cardiovascular:     Rate and Rhythm: Normal rate.  Pulmonary:     Effort: Pulmonary effort is normal.  Abdominal:     Palpations: Abdomen is soft.  Musculoskeletal:        General: Normal range of motion.     Cervical back: Normal range of motion.     Right lower leg: No edema.     Left lower leg: No edema.  Skin:    General: Skin is warm and dry.  Neurological:     Comments: Alert and oriented x 3. Reasonable insight and awareness. Fair Memory. Normal language and speech. Cranial nerve exam unremarkable. MMT: RUE 5/5. [name] 4+/5 with mild pronator drift. RLE 4/5 prox to 5/5 distal. LLE 4/5 prox to 4+/5 distal. Decreased FMC LLE. Decreased LT and proprioception in LLE below knee. No abnl tone. DTR's 1+.    Psychiatric:        Mood and Affect: Mood normal.        Behavior: Behavior normal.     Results for orders placed or performed during the hospital encounter of 07/07/22 (from the past 24 hour(s))  CBC with Differential/Platelet     Status: Abnormal  Collection Time: 07/13/22  7:09 AM  Result Value Ref Range   WBC 3.9 (L) 4.0 - 10.5 K/uL   RBC 4.27 4.22 - 5.81 MIL/uL   Hemoglobin 12.4 (L) 13.0 - 17.0 g/dL   HCT 16.1 (L) 09.6 - 04.5 %   MCV 87.1 80.0 - 100.0 fL   MCH 29.0 26.0 - 34.0 pg   MCHC 33.3 30.0 - 36.0 g/dL   RDW 40.9 81.1 - 91.4 %   Platelets 106 (L) 150 - 400 K/uL   nRBC 0.0 0.0 - 0.2 %   Neutrophils Relative % 53 %   Neutro Abs 2.1 1.7 - 7.7 K/uL   Lymphocytes Relative 33 %   Lymphs Abs 1.3 0.7 - 4.0 K/uL   Monocytes Relative 8 %   Monocytes Absolute 0.3 0.1 - 1.0 K/uL   Eosinophils Relative 5 %   Eosinophils Absolute 0.2 0.0 - 0.5 K/uL   Basophils Relative 1 %   Basophils Absolute 0.0 0.0 - 0.1 K/uL   Immature Granulocytes 0 %   Abs Immature Granulocytes 0.01 0.00 - 0.07 K/uL  Brain natriuretic peptide     Status: None   Collection Time: 07/13/22  7:09 AM  Result Value Ref Range   B Natriuretic Peptide 57.0 0.0 - 100.0 pg/mL   Basic metabolic panel     Status: Abnormal   Collection Time: 07/13/22  7:09 AM  Result Value Ref Range   Sodium 137 135 - 145 mmol/L   Potassium 3.7 3.5 - 5.1 mmol/L   Chloride 104 98 - 111 mmol/L   CO2 24 22 - 32 mmol/L   Glucose, Bld 99 70 - 99 mg/dL   BUN 7 6 - 20 mg/dL   Creatinine, Ser 7.82 0.61 - 1.24 mg/dL   Calcium 8.5 (L) 8.9 - 10.3 mg/dL   GFR, Estimated >95 >62 mL/min   Anion gap 9 5 - 15  Magnesium     Status: None   Collection Time: 07/13/22  7:09 AM  Result Value Ref Range   Magnesium 2.1 1.7 - 2.4 mg/dL   No results found.  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    Does the need for close, 24 hr/day medical supervision in concert with the patient's rehab needs make it unreasonable for this patient to be served in a less intensive setting? 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 Yes Does the patient require coordinated care of a physician, rehab nurse, therapy disciplines of PT, OT to address physical and functional deficits in the context of the above medical diagnosis(es)? Yes Addressing deficits in the following areas: balance, endurance, locomotion, strength, transferring, bowel/bladder control, bathing, dressing, feeding, grooming, toileting, and psychosocial support Can the patient actively participate in an intensive therapy program of at least 3 hrs of therapy per day at least 5 days per week? Yes The potential for patient to make measurable gains while on inpatient rehab  is excellent Anticipated functional outcomes upon discharge from inpatient rehab are modified independent and supervision  with PT, modified independent and supervision with OT, n/a with SLP. Estimated rehab length of stay to reach the above functional goals is: 9-13 days Anticipated  discharge destination: Home Overall Rehab/Functional Prognosis: excellent  POST ACUTE RECOMMENDATIONS: This patient's condition is appropriate for continued rehabilitative care in the following setting: CIR Patient has agreed to participate in recommended program. Yes Note that insurance prior authorization may be required for reimbursement for recommended care.  Comment: Rehab Admissions Coordinator to follow up.   I have personally performed a face to face diagnostic evaluation of this patient. Additionally, I have examined the patient's medical record including any pertinent labs and radiographic images. If the physician assistant has documented in this note, I have reviewed and edited or otherwise concur with the physician assistant's documentation.  Thanks,  Ranelle Oyster, MD 07/13/2022

## 2022-07-13 NOTE — Progress Notes (Signed)
EEG complete - results pending 

## 2022-07-14 ENCOUNTER — Inpatient Hospital Stay (HOSPITAL_COMMUNITY)
Admission: RE | Admit: 2022-07-14 | Discharge: 2022-07-15 | DRG: 977 | Disposition: A | Discharge status: Other Acute Inpatient Hospital | Payer: BC Managed Care – PPO | Source: Intra-hospital | Attending: Physical Medicine and Rehabilitation | Admitting: Physical Medicine and Rehabilitation

## 2022-07-14 ENCOUNTER — Inpatient Hospital Stay (HOSPITAL_COMMUNITY): Payer: BC Managed Care – PPO

## 2022-07-14 ENCOUNTER — Other Ambulatory Visit (HOSPITAL_COMMUNITY): Payer: Self-pay

## 2022-07-14 ENCOUNTER — Encounter (HOSPITAL_COMMUNITY): Payer: Self-pay | Admitting: Physical Medicine and Rehabilitation

## 2022-07-14 ENCOUNTER — Other Ambulatory Visit: Payer: Self-pay

## 2022-07-14 ENCOUNTER — Encounter (HOSPITAL_COMMUNITY): Payer: Self-pay

## 2022-07-14 DIAGNOSIS — D649 Anemia, unspecified: Secondary | ICD-10-CM | POA: Diagnosis not present

## 2022-07-14 DIAGNOSIS — R296 Repeated falls: Secondary | ICD-10-CM | POA: Diagnosis present

## 2022-07-14 DIAGNOSIS — A812 Progressive multifocal leukoencephalopathy: Secondary | ICD-10-CM | POA: Diagnosis present

## 2022-07-14 DIAGNOSIS — B20 Human immunodeficiency virus [HIV] disease: Secondary | ICD-10-CM | POA: Diagnosis not present

## 2022-07-14 DIAGNOSIS — Z9181 History of falling: Secondary | ICD-10-CM | POA: Diagnosis not present

## 2022-07-14 DIAGNOSIS — I4891 Unspecified atrial fibrillation: Secondary | ICD-10-CM | POA: Diagnosis present

## 2022-07-14 DIAGNOSIS — T50996D Underdosing of other drugs, medicaments and biological substances, subsequent encounter: Secondary | ICD-10-CM | POA: Diagnosis not present

## 2022-07-14 DIAGNOSIS — R531 Weakness: Secondary | ICD-10-CM | POA: Diagnosis not present

## 2022-07-14 DIAGNOSIS — Z8616 Personal history of COVID-19: Secondary | ICD-10-CM

## 2022-07-14 DIAGNOSIS — Z86711 Personal history of pulmonary embolism: Secondary | ICD-10-CM | POA: Diagnosis not present

## 2022-07-14 DIAGNOSIS — F411 Generalized anxiety disorder: Secondary | ICD-10-CM | POA: Diagnosis not present

## 2022-07-14 DIAGNOSIS — I2699 Other pulmonary embolism without acute cor pulmonale: Secondary | ICD-10-CM | POA: Diagnosis not present

## 2022-07-14 DIAGNOSIS — J45909 Unspecified asthma, uncomplicated: Secondary | ICD-10-CM | POA: Diagnosis not present

## 2022-07-14 DIAGNOSIS — B009 Herpesviral infection, unspecified: Secondary | ICD-10-CM | POA: Diagnosis present

## 2022-07-14 DIAGNOSIS — K111 Hypertrophy of salivary gland: Secondary | ICD-10-CM

## 2022-07-14 DIAGNOSIS — R0682 Tachypnea, not elsewhere classified: Secondary | ICD-10-CM | POA: Diagnosis not present

## 2022-07-14 DIAGNOSIS — Z1152 Encounter for screening for COVID-19: Secondary | ICD-10-CM

## 2022-07-14 DIAGNOSIS — I10 Essential (primary) hypertension: Secondary | ICD-10-CM | POA: Diagnosis not present

## 2022-07-14 DIAGNOSIS — J9811 Atelectasis: Secondary | ICD-10-CM | POA: Diagnosis not present

## 2022-07-14 DIAGNOSIS — E119 Type 2 diabetes mellitus without complications: Secondary | ICD-10-CM | POA: Diagnosis not present

## 2022-07-14 DIAGNOSIS — R5381 Other malaise: Principal | ICD-10-CM | POA: Diagnosis present

## 2022-07-14 DIAGNOSIS — R000 Tachycardia, unspecified: Secondary | ICD-10-CM | POA: Diagnosis not present

## 2022-07-14 DIAGNOSIS — E669 Obesity, unspecified: Secondary | ICD-10-CM | POA: Diagnosis not present

## 2022-07-14 DIAGNOSIS — Z8673 Personal history of transient ischemic attack (TIA), and cerebral infarction without residual deficits: Secondary | ICD-10-CM

## 2022-07-14 DIAGNOSIS — F419 Anxiety disorder, unspecified: Secondary | ICD-10-CM | POA: Diagnosis present

## 2022-07-14 DIAGNOSIS — B003 Herpesviral meningitis: Secondary | ICD-10-CM

## 2022-07-14 DIAGNOSIS — E785 Hyperlipidemia, unspecified: Secondary | ICD-10-CM | POA: Diagnosis not present

## 2022-07-14 DIAGNOSIS — Z6829 Body mass index (BMI) 29.0-29.9, adult: Secondary | ICD-10-CM

## 2022-07-14 DIAGNOSIS — I2609 Other pulmonary embolism with acute cor pulmonale: Secondary | ICD-10-CM | POA: Diagnosis not present

## 2022-07-14 DIAGNOSIS — Z91128 Patient's intentional underdosing of medication regimen for other reason: Secondary | ICD-10-CM

## 2022-07-14 LAB — COMPREHENSIVE METABOLIC PANEL
ALT: 16 U/L (ref 0–44)
AST: 14 U/L — ABNORMAL LOW (ref 15–41)
Albumin: 3.3 g/dL — ABNORMAL LOW (ref 3.5–5.0)
Alkaline Phosphatase: 62 U/L (ref 38–126)
Anion gap: 13 (ref 5–15)
BUN: 10 mg/dL (ref 6–20)
CO2: 18 mmol/L — ABNORMAL LOW (ref 22–32)
Calcium: 8.8 mg/dL — ABNORMAL LOW (ref 8.9–10.3)
Chloride: 105 mmol/L (ref 98–111)
Creatinine, Ser: 0.9 mg/dL (ref 0.61–1.24)
GFR, Estimated: >60 mL/min (ref >60)
Glucose, Bld: 118 mg/dL — ABNORMAL HIGH (ref 70–99)
Potassium: 3.6 mmol/L (ref 3.5–5.1)
Sodium: 136 mmol/L (ref 135–145)
Total Bilirubin: 0.8 mg/dL (ref 0.3–1.2)
Total Protein: 8 g/dL (ref 6.5–8.1)

## 2022-07-14 LAB — CBC WITH DIFFERENTIAL/PLATELET
Abs Immature Granulocytes: 0.01 10*3/uL (ref 0.00–0.07)
Abs Immature Granulocytes: 0.02 10*3/uL (ref 0.00–0.07)
Basophils Absolute: 0 10*3/uL (ref 0.0–0.1)
Basophils Absolute: 0 10*3/uL (ref 0.0–0.1)
Basophils Relative: 1 %
Basophils Relative: 0 %
Eosinophils Absolute: 0.1 10*3/uL (ref 0.0–0.5)
Eosinophils Absolute: 0 10*3/uL (ref 0.0–0.5)
Eosinophils Relative: 2 %
Eosinophils Relative: 1 %
HCT: 37 % — ABNORMAL LOW (ref 39.0–52.0)
HCT: 39.4 % (ref 39.0–52.0)
Hemoglobin: 12.3 g/dL — ABNORMAL LOW (ref 13.0–17.0)
Hemoglobin: 13.1 g/dL (ref 13.0–17.0)
Immature Granulocytes: 0 %
Immature Granulocytes: 0 %
Lymphocytes Relative: 26 %
Lymphocytes Relative: 21 %
Lymphs Abs: 1.1 10*3/uL (ref 0.7–4.0)
Lymphs Abs: 1.1 10*3/uL (ref 0.7–4.0)
MCH: 29.3 pg (ref 26.0–34.0)
MCH: 28.9 pg (ref 26.0–34.0)
MCHC: 33.2 g/dL (ref 30.0–36.0)
MCHC: 33.2 g/dL (ref 30.0–36.0)
MCV: 88.1 fL (ref 80.0–100.0)
MCV: 86.8 fL (ref 80.0–100.0)
Monocytes Absolute: 0.3 10*3/uL (ref 0.1–1.0)
Monocytes Absolute: 0.5 10*3/uL (ref 0.1–1.0)
Monocytes Relative: 7 %
Monocytes Relative: 8 %
Neutro Abs: 2.6 10*3/uL (ref 1.7–7.7)
Neutro Abs: 3.7 10*3/uL (ref 1.7–7.7)
Neutrophils Relative %: 64 %
Neutrophils Relative %: 70 %
Platelets: 110 10*3/uL — ABNORMAL LOW (ref 150–400)
Platelets: 116 10*3/uL — ABNORMAL LOW (ref 150–400)
RBC: 4.2 MIL/uL — ABNORMAL LOW (ref 4.22–5.81)
RBC: 4.54 MIL/uL (ref 4.22–5.81)
RDW: 13.3 % (ref 11.5–15.5)
RDW: 13.3 % (ref 11.5–15.5)
WBC: 4.1 10*3/uL (ref 4.0–10.5)
WBC: 5.4 10*3/uL (ref 4.0–10.5)
nRBC: 0 % (ref 0.0–0.2)
nRBC: 0 % (ref 0.0–0.2)

## 2022-07-14 LAB — EXPECTORATED SPUTUM ASSESSMENT W GRAM STAIN, RFLX TO RESP C

## 2022-07-14 LAB — PROCALCITONIN: Procalcitonin: <0.1 ng/mL

## 2022-07-14 LAB — LACTIC ACID, PLASMA: Lactic Acid, Venous: 0.9 mmol/L (ref 0.5–1.9)

## 2022-07-14 LAB — BASIC METABOLIC PANEL
Anion gap: 8 (ref 5–15)
BUN: 8 mg/dL (ref 6–20)
CO2: 20 mmol/L — ABNORMAL LOW (ref 22–32)
Calcium: 8.6 mg/dL — ABNORMAL LOW (ref 8.9–10.3)
Chloride: 107 mmol/L (ref 98–111)
Creatinine, Ser: 0.81 mg/dL (ref 0.61–1.24)
GFR, Estimated: >60 mL/min (ref >60)
Glucose, Bld: 106 mg/dL — ABNORMAL HIGH (ref 70–99)
Potassium: 3.5 mmol/L (ref 3.5–5.1)
Sodium: 135 mmol/L (ref 135–145)

## 2022-07-14 LAB — BRAIN NATRIURETIC PEPTIDE: B Natriuretic Peptide: 49.1 pg/mL (ref 0.0–100.0)

## 2022-07-14 LAB — SARS CORONAVIRUS 2 BY RT PCR: SARS Coronavirus 2 by RT PCR: NEGATIVE

## 2022-07-14 LAB — MAGNESIUM: Magnesium: 2.1 mg/dL (ref 1.7–2.4)

## 2022-07-14 MED ORDER — ALUM & MAG HYDROXIDE-SIMETH 200-200-20 MG/5ML PO SUSP: 30.0000 mL | ORAL | Status: DC | PRN

## 2022-07-14 MED ORDER — PROCHLORPERAZINE MALEATE 5 MG PO TABS: 5.0000 mg | ORAL_TABLET | Freq: Four times a day (QID) | ORAL | Status: DC | PRN

## 2022-07-14 MED ORDER — HEPARIN (PORCINE) 25000 UT/250ML-% IV SOLN
1600.0000 [IU]/h | INTRAVENOUS | Status: DC
  Administered 2022-07-15: 1600 [IU]/h via INTRAVENOUS
  Filled 2022-07-14: qty 250

## 2022-07-14 MED ORDER — GUAIFENESIN-DM 100-10 MG/5ML PO SYRP: 10.0000 mL | ORAL_SOLUTION | Freq: Four times a day (QID) | ORAL | Status: DC | PRN

## 2022-07-14 MED ORDER — HEPARIN SODIUM (PORCINE) 5000 UNIT/ML IJ SOLN
5000.0000 [IU] | Freq: Three times a day (TID) | INTRAMUSCULAR | Status: DC
Start: 2022-07-14 — End: 2022-07-14

## 2022-07-14 MED ORDER — IOHEXOL 350 MG/ML SOLN
75.0000 mL | Freq: Once | INTRAVENOUS | Status: AC | PRN
  Administered 2022-07-14: 75 mL via INTRAVENOUS

## 2022-07-14 MED ORDER — CYANOCOBALAMIN 1000 MCG PO TABS
1000.0000 ug | ORAL_TABLET | Freq: Every day | ORAL | 0 refills | Status: DC
  Filled 2022-07-14: qty 30, 30d supply, fill #0

## 2022-07-14 MED ORDER — SULFAMETHOXAZOLE-TRIMETHOPRIM 800-160 MG PO TABS: 1.0000 | ORAL_TABLET | Freq: Every day | ORAL | Status: DC

## 2022-07-14 MED ORDER — ALBUTEROL SULFATE (2.5 MG/3ML) 0.083% IN NEBU: 2.5000 mg | INHALATION_SOLUTION | RESPIRATORY_TRACT | Status: DC | PRN

## 2022-07-14 MED ORDER — SORBITOL 70 % SOLN: 30.0000 mL | Freq: Every day | Status: DC | PRN

## 2022-07-14 MED ORDER — ROSUVASTATIN CALCIUM 10 MG PO TABS
10.0000 mg | ORAL_TABLET | Freq: Every day | ORAL | 0 refills | Status: DC
  Filled 2022-07-14: qty 30, 30d supply, fill #0

## 2022-07-14 MED ORDER — FLEET ENEMA 7-19 GM/118ML RE ENEM: 1.0000 | ENEMA | Freq: Once | RECTAL | Status: DC | PRN

## 2022-07-14 MED ORDER — PROCHLORPERAZINE EDISYLATE 10 MG/2ML IJ SOLN: 5.0000 mg | Freq: Four times a day (QID) | INTRAMUSCULAR | Status: DC | PRN

## 2022-07-14 MED ORDER — ALPRAZOLAM 0.5 MG PO TABS: 0.5000 mg | ORAL_TABLET | Freq: Two times a day (BID) | ORAL | Status: DC | PRN

## 2022-07-14 MED ORDER — ORAL CARE MOUTH RINSE: 15.0000 mL | OROMUCOSAL | Status: DC | PRN

## 2022-07-14 MED ORDER — DIPHENHYDRAMINE HCL 25 MG PO CAPS: 25.0000 mg | ORAL_CAPSULE | Freq: Four times a day (QID) | ORAL | Status: DC | PRN

## 2022-07-14 MED ORDER — METHOCARBAMOL 500 MG PO TABS: 500.0000 mg | ORAL_TABLET | Freq: Four times a day (QID) | ORAL | Status: DC | PRN

## 2022-07-14 MED ORDER — POLYETHYLENE GLYCOL 3350 17 G PO PACK: 17.0000 g | PACK | Freq: Every day | ORAL | Status: DC | PRN

## 2022-07-14 MED ORDER — TRAZODONE HCL 50 MG PO TABS: 50.0000 mg | ORAL_TABLET | Freq: Every evening | ORAL | Status: DC | PRN

## 2022-07-14 MED ORDER — HEPARIN BOLUS VIA INFUSION
4000.0000 [IU] | Freq: Once | INTRAVENOUS | Status: AC
  Administered 2022-07-15: 4000 [IU] via INTRAVENOUS
  Filled 2022-07-14: qty 4000

## 2022-07-14 MED ORDER — BICTEGRAVIR-EMTRICITAB-TENOFOV 50-200-25 MG PO TABS
1.0000 | ORAL_TABLET | Freq: Every day | ORAL | Status: DC
  Filled 2022-07-14: qty 1

## 2022-07-14 MED ORDER — HEPARIN SODIUM (PORCINE) 5000 UNIT/ML IJ SOLN
5000.0000 [IU] | Freq: Three times a day (TID) | INTRAMUSCULAR | Status: DC
  Administered 2022-07-14: 5000 [IU] via SUBCUTANEOUS
  Filled 2022-07-14: qty 1

## 2022-07-14 MED ORDER — ACETAMINOPHEN 325 MG PO TABS
325.0000 mg | ORAL_TABLET | ORAL | Status: DC | PRN
  Administered 2022-07-14: 650 mg via ORAL
  Filled 2022-07-14: qty 2

## 2022-07-14 MED ORDER — ROSUVASTATIN CALCIUM 5 MG PO TABS: 10.0000 mg | ORAL_TABLET | Freq: Every day | ORAL | Status: DC

## 2022-07-14 MED ORDER — VALACYCLOVIR HCL 500 MG PO TABS
1000.0000 mg | ORAL_TABLET | Freq: Three times a day (TID) | ORAL | Status: DC
  Administered 2022-07-14: 1000 mg via ORAL
  Filled 2022-07-14: qty 2

## 2022-07-14 MED ORDER — VITAMIN B-12 1000 MCG PO TABS: 2000.0000 ug | ORAL_TABLET | Freq: Every day | ORAL | Status: DC

## 2022-07-14 MED ORDER — PROCHLORPERAZINE 25 MG RE SUPP: 12.5000 mg | Freq: Four times a day (QID) | RECTAL | Status: DC | PRN

## 2022-07-14 NOTE — Progress Notes (Signed)
Subjective:  No new complaints   Antibiotics:  Anti-infectives (From admission, onward)    Start     Dose/Rate Route Frequency Ordered Stop   07/13/22 1115  valACYclovir (VALTREX) tablet 1,000 mg        1,000 mg Oral Every 8 hours 07/13/22 1016 07/23/22 0559   07/13/22 0000  valACYclovir (VALTREX) 1000 MG tablet        1,000 mg Oral 3 times daily 07/13/22 1025 07/22/22 2359   07/13/22 0000  bictegravir-emtricitabine-tenofovir AF (BIKTARVY) 50-200-25 MG TABS tablet        1 tablet Oral Daily 07/13/22 1025     07/13/22 0000  sulfamethoxazole-trimethoprim (BACTRIM DS) 800-160 MG tablet        1 tablet Oral Daily 07/13/22 1025     07/10/22 1615  bictegravir-emtricitabine-tenofovir AF (BIKTARVY) 50-200-25 MG per tablet 1 tablet        1 tablet Oral Daily 07/10/22 1515     07/10/22 1615  sulfamethoxazole-trimethoprim (BACTRIM DS) 800-160 MG per tablet 1 tablet        1 tablet Oral Daily 07/10/22 1522     07/08/22 2315  acyclovir (ZOVIRAX) 1,000 mg in dextrose 5 % 250 mL IVPB  Status:  Discontinued        1,000 mg 270 mL/hr over 60 Minutes Intravenous Every 8 hours 07/08/22 2221 07/08/22 2229   07/08/22 2315  acyclovir (ZOVIRAX) 870 mg in dextrose 5 % 250 mL IVPB  Status:  Discontinued        10 mg/kg  86.9 kg (Adjusted) 267.4 mL/hr over 60 Minutes Intravenous Every 8 hours 07/08/22 2229 07/13/22 1016       Medications: Scheduled Meds:  bictegravir-emtricitabine-tenofovir AF  1 tablet Oral Daily   vitamin B-12  2,000 mcg Oral Daily   heparin  5,000 Units Subcutaneous Q8H   rosuvastatin  10 mg Oral Daily   sulfamethoxazole-trimethoprim  1 tablet Oral Daily   valACYclovir  1,000 mg Oral Q8H   Continuous Infusions:   PRN Meds:.acetaminophen, albuterol, guaiFENesin-dextromethorphan, HYDROmorphone (DILAUDID) injection, mouth rinse, oxyCODONE, polyethylene glycol, prochlorperazine    Objective: Weight change:   Intake/Output Summary (Last 24 hours) at 07/14/2022  1131 Last data filed at 07/14/2022 0900 Gross per 24 hour  Intake 240 ml  Output 2625 ml  Net -2385 ml    Blood pressure 123/79, pulse 87, temperature 99.6 F (37.6 C), temperature source Oral, resp. rate (!) 21, height 5\' 11"  (1.803 m), weight 104.3 kg, SpO2 96 %. Temp:  [97.8 F (36.6 C)-99.6 F (37.6 C)] 99.6 F (37.6 C) (06/07 0800) Pulse Rate:  [75-88] 87 (06/07 0451) Resp:  [21-30] 21 (06/07 0451) BP: (91-137)/(67-98) 123/79 (06/07 0451) SpO2:  [91 %-96 %] 96 % (06/06 2100)  Physical Exam: Physical Exam Constitutional:      Appearance: He is well-developed.  HENT:     Head: Normocephalic and atraumatic.  Eyes:     Conjunctiva/sclera: Conjunctivae normal.  Cardiovascular:     Rate and Rhythm: Normal rate and regular rhythm.  Pulmonary:     Effort: Pulmonary effort is normal. No respiratory distress.     Breath sounds: No wheezing.  Abdominal:     General: There is no distension.     Palpations: Abdomen is soft.  Musculoskeletal:        General: Normal range of motion.     Cervical back: Normal range of motion and neck supple.  Skin:    General: Skin is warm and  dry.     Findings: No erythema or rash.  Neurological:     Mental Status: He is alert and oriented to person, place, and time.  Psychiatric:        Mood and Affect: Mood normal.        Behavior: Behavior normal.        Thought Content: Thought content normal.        Cognition and Memory: He exhibits impaired recent memory.        Judgment: Judgment normal.     Left sided weakness  CBC:    BMET Recent Labs    07/13/22 0709 07/14/22 0622  NA 137 135  K 3.7 3.5  CL 104 107  CO2 24 20*  GLUCOSE 99 106*  BUN 7 8  CREATININE 0.89 0.81  CALCIUM 8.5* 8.6*      Liver Panel  No results for input(s): "PROT", "ALBUMIN", "AST", "ALT", "ALKPHOS", "BILITOT", "BILIDIR", "IBILI" in the last 72 hours.      Sedimentation Rate No results for input(s): "ESRSEDRATE" in the last 72  hours. C-Reactive Protein No results for input(s): "CRP" in the last 72 hours.  Micro Results: Recent Results (from the past 720 hour(s))  Urine Culture     Status: Abnormal   Collection Time: 07/05/22  9:19 AM   Specimen: Urine, Clean Catch  Result Value Ref Range Status   Specimen Description   Final    URINE, CLEAN CATCH Performed at [facility], [address], [place], [place] [address]    Special Requests   Final    NONE Reflexed from [phone] Performed at [facility], [address] [place], [place] [address]    Culture MULTIPLE SPECIES PRESENT, SUGGEST RECOLLECTION (A)  Final   Report Status 07/06/2022 FINAL  Final  Blood Culture (routine x 2)     Status: None   Collection Time: 07/05/22  9:46 AM   Specimen: Left Antecubital; Blood  Result Value Ref Range Status   Specimen Description LEFT ANTECUBITAL  Final   Special Requests   Final    BOTTLES DRAWN AEROBIC AND ANAEROBIC Blood Culture results may not be optimal due to an excessive volume of blood received in culture bottles   Culture   Final    NO GROWTH 6 DAYS Performed at [facility], [address], [place], [place] [address]    Report Status 07/11/2022 FINAL  Final  Blood Culture (routine x 2)     Status: None   Collection Time: 07/05/22  9:46 AM   Specimen: BLOOD RIGHT ARM  Result Value Ref Range Status   Specimen Description BLOOD RIGHT ARM  Final   Special Requests   Final    BOTTLES DRAWN AEROBIC AND ANAEROBIC Blood Culture results may not be optimal due to an excessive volume of blood received in culture bottles   Culture   Final    NO GROWTH 6 DAYS Performed at [facility], [address] [place], [place] [address]    Report Status 07/11/2022 FINAL  Final  Resp panel by RT-PCR (RSV, Flu A&B, Covid) Anterior Nasal Swab     Status: None   Collection Time: 07/05/22  9:47 AM   Specimen: Anterior Nasal Swab  Result Value Ref Range Status   SARS Coronavirus 2 by RT PCR NEGATIVE NEGATIVE Final     Comment: (NOTE) SARS-CoV-2 target nucleic acids are NOT DETECTED.  The SARS-CoV-2 RNA is generally detectable in upper respiratory specimens during the acute phase of  infection. The lowest concentration of SARS-CoV-2 viral copies this assay can detect is 138 copies/mL. A negative result does not preclude SARS-Cov-2 infection and should not be used as the sole basis for treatment or other patient management decisions. A negative result may occur with  improper specimen collection/handling, submission of specimen other than nasopharyngeal swab, presence of viral mutation(s) within the areas targeted by this assay, and inadequate number of viral copies(<138 copies/mL). A negative result must be combined with clinical observations, patient history, and epidemiological information. The expected result is Negative.  Fact Sheet for Patients:  BloggerCourse.com  Fact Sheet for Healthcare Providers:  SeriousBroker.it  This test is no t yet approved or cleared by the [place] FDA and  has been authorized for detection and/or diagnosis of SARS-CoV-2 by FDA under an Emergency Use Authorization (EUA). This EUA will remain  in effect (meaning this test can be used) for the duration of the COVID-19 declaration under Section 564(b)(1) of the Act, 21 U.S.C.section 360bbb-3(b)(1), unless the authorization is terminated  or revoked sooner.       Influenza A by PCR NEGATIVE NEGATIVE Final   Influenza B by PCR NEGATIVE NEGATIVE Final    Comment: (NOTE) The Xpert Xpress SARS-CoV-2/FLU/RSV plus assay is intended as an aid in the diagnosis of influenza from Nasopharyngeal swab specimens and should not be used as a sole basis for treatment. Nasal washings and aspirates are unacceptable for Xpert Xpress SARS-CoV-2/FLU/RSV testing.  Fact Sheet for Patients: BloggerCourse.com  Fact Sheet for Healthcare  Providers: SeriousBroker.it  This test is not yet approved or cleared by the [place] FDA and has been authorized for detection and/or diagnosis of SARS-CoV-2 by FDA under an Emergency Use Authorization (EUA). This EUA will remain in effect (meaning this test can be used) for the duration of the COVID-19 declaration under Section 564(b)(1) of the Act, 21 U.S.C. section 360bbb-3(b)(1), unless the authorization is terminated or revoked.     Resp Syncytial Virus by PCR NEGATIVE NEGATIVE Final    Comment: (NOTE) Fact Sheet for Patients: BloggerCourse.com  Fact Sheet for Healthcare Providers: SeriousBroker.it  This test is not yet approved or cleared by the [place] FDA and has been authorized for detection and/or diagnosis of SARS-CoV-2 by FDA under an Emergency Use Authorization (EUA). This EUA will remain in effect (meaning this test can be used) for the duration of the COVID-19 declaration under Section 564(b)(1) of the Act, 21 U.S.C. section 360bbb-3(b)(1), unless the authorization is terminated or revoked.  Performed at [facility] [address] [place], [place] [address]   Urine Culture     Status: Abnormal   Collection Time: 07/07/22  7:21 PM   Specimen: Urine, Clean Catch  Result Value Ref Range Status   Specimen Description   Final    URINE, CLEAN CATCH Performed at [facility], [address], [place], [place] [address]    Special Requests   Final    NONE Performed at [facility], [address] [place], [place] [address]    Culture (A)  Final    <10,000 COLONIES/mL INSIGNIFICANT GROWTH Performed at [facility], [address] [place], [place] [address]    Report Status 07/08/2022 FINAL  Final  CSF culture w Gram Stain     Status: None   Collection Time: 07/08/22 12:30 PM   Specimen: CSF; Cerebrospinal Fluid  Result Value Ref  Range Status   Specimen Description   Final    CSF  Performed at [facility], [address] [place], [place] [address]    Special Requests   Final    NONE Performed at [facility], [address], [place], [place] [address]    Gram Stain   Final    WBC PRESENT,BOTH PMN AND MONONUCLEAR NO ORGANISMS SEEN CYTOSPIN Gram Stain Report Called to,Read Back By and Verified With: Derl Barrow RN @1425  ON 6.1.2024 BY [facility] Performed at [facility], [address] [place], [place] [address]    Culture   Final    NO GROWTH 3 DAYS Performed at [facility], [address], [place], [place] [address]    Report Status 07/12/2022 FINAL  Final    Studies/Results: EEG adult  Result Date: 07/13/2022 Charlsie Quest, MD     07/13/2022 10:05 PM Patient Name: Jose Lamb MRN: [id number] Epilepsy Attending: Charlsie Quest Referring Physician/Provider: Leroy Sea, MD Date: 07/13/2022 Duration: 23.28 mins Patient history:  62 year old male with a 6-week history of increasing left-sided weakness and hemisensory deficits getting eeg to evaluate for seizure. Level of alertness: Awake, asleep AEDs during EEG study: None Technical aspects: This EEG study was done with scalp electrodes positioned according to the 10-20 International system of electrode placement. Electrical activity was reviewed with band pass filter of 1-70Hz , sensitivity of 7 uV/mm, display speed of 27mm/sec with a 60Hz  notched filter applied as appropriate. EEG data were recorded continuously and digitally stored.  Video monitoring was available and reviewed as appropriate. Description: The posterior dominant rhythm consists of 9 Hz activity of moderate voltage (25-35 uV) seen predominantly in posterior head regions, symmetric and reactive to eye opening and eye closing. Sleep was characterized by vertex waves, sleep spindles (12 to 14 Hz), maximal frontocentral region.  Physiologic photic driving was seen during photic stimulation.  Hyperventilation was not performed.   IMPRESSION: This study is within normal limits. No seizures or epileptiform discharges were seen throughout the recording. A normal interictal EEG does not exclude the diagnosis of epilepsy. 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  He underwent lumbar puncture which showed lymphocytic pleocytosis with a white count of 105 glucose of 48 protein of 70.  Meningitis encephalitis panel positive for HSV-2. He also has bilateral parotid gland masses and is now sp IR guided aspirate  #1 Left sided weakness appears due to PML in context of uncontrolled HIV  Continue Biktarvy and Bactrim  #2 HSV2 :unlikely contributing to his CNS pathology but will finish out a course with valtrex  3.  Positive RPR titer is low at 1-1 we will follow-up tPA negative = false +  Parotid gland swelling:  negative for malignancy   4 Disp: it sounds that the burden of his CNS pathology in disability that he really is going to need to be placed in a skilled nursing facility at least for the short-term if not potentially long-term.    I agree with DC to  CIR.   We worry about his ability to function at home even after he improves.  I have personally spent 50 minutes involved in face-to-face and non-face-to-face activities for this patient on the day of the visit. Professional time spent includes the following activities: Preparing  to see the patient (review of tests), Obtaining and/or reviewing separately obtained history (admission/discharge record), Performing a medically appropriate examination and/or evaluation , Ordering medications/tests/procedures, referring and communicating with other health care professionals, Documenting clinical information in the EMR, Independently interpreting results (not separately reported), Communicating results to the patient/family/caregiver, Counseling and educating the patient/family/caregiver and Care coordination (not separately reported).    Jose Lamb has an appointment on 08/02/2022 at 345 PM with Dr. Daiva Eves at  [facility], which  is located in the [facility] at  [address] [place] in [place].  [address], which is located to the left of the elevators.  Phone: [phone]  Fax: [phone]  [email or web address]  The patient should arrive 30 minutes prior to their appoitment.  His HIV related meds have been procured by [facility] for when he does DC  Will sign off for now please call with further questions and certainly should his clinic appointment need to be rescheduled.    LOS: 7 days   Acey Lav 07/14/2022, 11:31 AM

## 2022-07-14 NOTE — H&P (Signed)
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  Chief complaints were allover soreness and weakness.  Workup was significant for possible urinary tract infection and hypokalemia.  He was placed on antibiotics and given potassium supplement and discharged home.  He re-presented to the emergency department at [facility] on 07/07/2022 complaining of an approximately 6-week history of left-sided weakness.  Notes indicate the patient was sent there for MRI scanning.  Neurology was consulted and MRI of the brain revealed abnormal T2 signal of the cortical subcortical and right greater than left insular postcentral gyrus as well as splenium and body of corpus callosum and thalamus and midbrain.  This was concerning for infectious versus inflammatory process such as PML, autoimmune encephalitis, herpes simplex and enteritis.  Recommendations were for high-volume LP with labs, MRI of the brain with contrast and admission to [facility].  The patient's pertinent past medical history includes HIV, hypertension and diabetes mellitus.  The patient's history of HIV disease is longstanding and he has not been on ART for several years going back to 2017.  His most recent regimen was Descovy, darnunavir/ritonavir and etravirine.  Prior history of bilateral pulmonary embolism 2021, atrial fibrillation in the setting of hyperthyroidism.  The patient is not chronically anticoagulated.   Infectious disease consultation obtained by Dr. Earlene Plater on 6/01.  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  Given high-dose B12  daily. Imaging was also significant for bilateral parotid masses and ENT consultation placed for biopsy.  Ultrasound-guided aspiration of bilateral parotid cyst on 6/03 by Dr. Grace Isaac.  Negative for malignancy. Prior to approximately 6 weeks ago patient was independent without a device and working.  Over the last 6 weeks he required increasing assistance from his spouse for mobility and basic self-care activities.  He will discharge home with his spouse to a multilevel home with 2 steps to enter.  He can stay on the first level which has a bedroom and bathroom. 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    Review of Systems  Constitutional:  Positive for malaise/fatigue. Negative for fever.  HENT:  Negative for hearing loss and tinnitus.   Eyes: Negative.   Respiratory:  Negative for cough.   Cardiovascular:  Negative for chest pain.  Gastrointestinal:  Negative for heartburn, nausea and vomiting.  Genitourinary:  Negative for dysuria.  Musculoskeletal:  Positive for back pain, falls and myalgias.  Skin:  Negative for rash.  Neurological:  Positive for sensory change, focal weakness and weakness. Negative for dizziness.  Psychiatric/Behavioral:  The patient is nervous/anxious.    Past Medical History:  Diagnosis Date   Atrial fibrillation [facility])    In the setting of hyperthyroidism   Bilateral pulmonary embolism [facility])    Diagnosed August 2021   Diabetes mellitus without complication (HCC)    Essential hypertension    HIV antibody positive (HCC)    Perforated duodenal ulcer (HCC) 2015   Contained without need for surgical intervention   Pneumonia due to  COVID-19 virus    Diagnosed August 2021   Past Surgical History:  Procedure Laterality Date   COLONOSCOPY  Sept 2015   [place]: transverse colon polyp with pathology reporting lymphoid nodule     ESOPHAGOGASTRODUODENOSCOPY N/A 03/30/2014   Procedure: ESOPHAGOGASTRODUODENOSCOPY (EGD);  Surgeon: Corbin Ade, MD;  Location: [facility] SUITE;  Service: Endoscopy;  Laterality: N/A;  215   UMBILICAL HERNIA REPAIR N/A 12/16/2021   Procedure: HERNIA REPAIR UMBILICAL ADULT WITH MESH;  Surgeon: Franky Macho, MD;  Location: [facility] ORS;  Service: General;  Laterality: N/A;   Family History  Problem Relation Age of Onset   Ulcers Mother    Heart Problems Mother    Colon cancer Neg Hx    Social History:  reports that he has never smoked. He has never used smokeless tobacco. He reports that he does not drink alcohol and does not use drugs. Allergies:  Allergies  Allergen Reactions   Asa [Aspirin] Other (See Comments)    Nose bleeds   Medications Prior to Admission  Medication Sig Dispense Refill   albuterol (PROVENTIL) (2.5 MG/3ML) 0.083% nebulizer solution Take 2.5 mg by nebulization every 4 (four) hours.     albuterol (VENTOLIN HFA) 108 (90 Base) MCG/ACT inhaler 1 puff every 4 (four) hours as needed for wheezing or shortness of breath.     ascorbic acid (VITAMIN C) 500 MG tablet Take 1 tablet (500 mg total) by mouth daily. 30 tablet 1   cephALEXin (KEFLEX) 500 MG capsule Take 1 capsule (500 mg total) by mouth 4 (four) times daily. 28 capsule 0   oxyCODONE-acetaminophen (PERCOCET) 10-325 MG tablet Take 1 tablet by mouth every 6 (six) hours.        Home: Home Living Family/patient expects to be discharged to:: Private residence Living Arrangements: Spouse/significant other, Children, Other relatives Available Help at Discharge: Family Type of Home: House Home Access: Stairs to enter Secretary/administrator of Steps: 2 Entrance Stairs-Rails: None (nephew planning to build rails) Home Layout: Multi-level, Laundry or work area in basement, [employer] on main level, Able to live on main level with bedroom/bathroom Bathroom Shower/Tub: Engineer, manufacturing systems: Standard Bathroom  Accessibility: Yes Home Equipment: Wheelchair - power, Medical laboratory scientific officer - single point Additional Comments: PTA and ~ last 6 weeks pt was fully independent and 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 power wc is from his wife when she was recovering from her cancer treatments. he has had to use it in the last ~6 weeks due to weakness.   Functional History: Prior Function Prior Level of Function : Independent/Modified Independent, Needs assist, Driving, Working/employed, History of Falls (last six months) Physical Assist : Mobility (physical), ADLs (physical) Mobility (physical): Transfers, Gait ADLs (physical): Bathing, Dressing, Toileting Mobility Comments: prior to ~6 weeks ago pt was independent with no device and working full time driving/operating large equipment. Over last 6 weeks pt has had worsening weakness in Lt side and decreased sensation, pt reports at least 8 falls. He has a SPC he has been using for some ambulation in home and power WC. pt reports he has no awareness /sense of Lt foot location and indicates he may have caught his foot under the chair. ADLs Comments: prior to ~6 weeks ago pt was independent with no AE required. Over last 6 weeks pt has required assits for transfers on/off toilet from spouse, is no longer showering due to fall in bathtub and is having assist from spouse for sink/sponge baths.  Functional Status:  Mobility:  Bed Mobility Overal bed mobility: Needs Assistance Bed Mobility: Supine to Sit Supine to sit: Min assist, HOB elevated Sit to supine: Max assist General bed mobility comments: HOB elevated and use of bed rail, minA for trunk support. Increased time to complete, with mild cues to fully scoot to the EOB Transfers Overall transfer level: Needs assistance Equipment used: Rolling walker (2 wheels), 1 person hand held assist Transfers: Sit to/from Stand, Bed to chair/wheelchair/BSC Sit to Stand: Mod assist, Min assist, From elevated surface Bed to/from  chair/wheelchair/BSC transfer type:: Step pivot Stand pivot transfers: Max assist, +2 physical assistance Step pivot transfers: Mod assist, Max assist, From elevated surface General transfer comment: minA to stand from elevated bed, with HHA, standing from chair x5 reps and BSC x1 rep, ranging from min-modA for power up and steady with trials. Performing step pivot transfer to chair, BSC, and back to chair with mod to maxA. modA when transferring to the L with maxA needed for R transfer for balance and sequencing. Cued for proper hand placement and technique with each transfer. Pt continues with left lateral lean, utilizing surface behind BLE for balance Ambulation/Gait Ambulation/Gait assistance: Mod assist, +2 physical assistance, +2 safety/equipment Gait Distance (Feet): 30 Feet Assistive device: Rolling walker (2 wheels) Gait Pattern/deviations: Step-to pattern, Decreased stride length, Decreased stance time - right, Decreased step length - left, Drifts right/left, Trunk flexed General Gait Details: not attempted today, focused on weight shift in standing to decrease left lateral lean Gait velocity: decr Pre-gait activities: lateral weight shifts trying to find midline, focused on maintaining RW on the ground    ADL: ADL Overall ADL's : Needs assistance/impaired Eating/Feeding: Set up, Sitting Grooming: Wash/dry face, Sitting, Supervision/safety Grooming Details (indicate cue type and reason): sitting EOB with unilateral UE support Upper Body Bathing: Set up, Sitting Lower Body Bathing: Moderate assistance, +2 for physical assistance, +2 for safety/equipment, Sit to/from stand Upper Body Dressing : Min guard, Sitting Lower Body Dressing: Maximal assistance, +2 for physical assistance, +2 for safety/equipment, Sit to/from stand Toilet Transfer: Moderate assistance, +2 for safety/equipment, +2 for physical assistance, Stand-pivot, BSC/3in1 Toilet Transfer Details (indicate cue type and  reason): transfer from [facility] towards R Toileting- Clothing Manipulation and Hygiene: Total assistance, +2 for physical assistance, +2 for safety/equipment, Sit to/from stand Toileting - Clothing Manipulation Details (indicate cue type and reason): total A for pericare post BM, required BUE support on RW for stability during task Functional mobility during ADLs: Moderate assistance, +2 for physical assistance, +2 for safety/equipment, Rolling walker (2 wheels) General ADL Comments: limited by L hemiplegia and decreased activity tolerance  Cognition: Cognition Overall Cognitive Status: Within Functional Limits for tasks assessed Orientation Level: Oriented X4 Cognition Arousal/Alertness: Awake/alert Behavior During Therapy: Flat affect Overall Cognitive Status: Within Functional Limits for tasks assessed General Comments: pt pleasant throughout session, HOH but following commands once he hears  Physical Exam: Blood pressure 123/79, pulse 87, temperature 99.6 F (37.6 C), temperature source Oral, resp. rate (!) 21, height 5\' 11"  (1.803 m), weight 104.3 kg, SpO2 96 %. Physical Exam Constitutional:      Appearance: He is obese. He is not ill-appearing.  HENT:     Head: Normocephalic and atraumatic.     Right Ear: External ear normal.     Left Ear: External ear normal.     Nose: Nose normal.     Mouth/Throat:     Mouth: Mucous membranes are moist.     Pharynx: Oropharynx is clear.  Eyes:  Extraocular Movements: Extraocular movements intact.     Pupils: Pupils are equal, round, and reactive to light.  Cardiovascular:     Rate and Rhythm: Normal rate and regular rhythm.     Heart sounds: No murmur heard. Pulmonary:     Effort: Pulmonary effort is normal. No respiratory distress.     Breath sounds: Normal breath sounds. No wheezing.  Abdominal:     General: Bowel sounds are normal. There is no distension.     Palpations: Abdomen is soft.     Tenderness: There is no abdominal  tenderness.  Musculoskeletal:     Cervical back: Normal range of motion.     Right lower leg: Edema present.     Left lower leg: Edema present.  Skin:    General: Skin is warm and dry.  Neurological:     Mental Status: He is alert.     Comments: Alert and oriented x 3. Fair and at times limited insight and awareness. Intact Memory. Normal language and speech. Cranial nerve exam unremarkable. MMT: 5/5 UE prox to distal. RLE 4/5 HF, 4+ KE and 4+ to 5/5 ADF/PF. LLE 3+/5 HF, 4-/5 KE and 4/5 ADF/PF. Decreased LT/proprioception below knees in stocking glove distribution, is able to discern gross touch.  No deficits in hands. DTR's 1+.   Psychiatric:     Comments: Pt somewhat anxious and distractible at times. Able to stay calm with reassuring words and conversation.      Results for orders placed or performed during the hospital encounter of 07/07/22 (from the past 48 hour(s))  CBC with Differential/Platelet     Status: Abnormal   Collection Time: 07/13/22  7:09 AM  Result Value Ref Range   WBC 3.9 (L) 4.0 - 10.5 K/uL   RBC 4.27 4.22 - 5.81 MIL/uL   Hemoglobin 12.4 (L) 13.0 - 17.0 g/dL   HCT 16.1 (L) 09.6 - 04.5 %   MCV 87.1 80.0 - 100.0 fL   MCH 29.0 26.0 - 34.0 pg   MCHC 33.3 30.0 - 36.0 g/dL   RDW 40.9 81.1 - 91.4 %   Platelets 106 (L) 150 - 400 K/uL    Comment: REPEATED TO VERIFY   nRBC 0.0 0.0 - 0.2 %   Neutrophils Relative % 53 %   Neutro Abs 2.1 1.7 - 7.7 K/uL   Lymphocytes Relative 33 %   Lymphs Abs 1.3 0.7 - 4.0 K/uL   Monocytes Relative 8 %   Monocytes Absolute 0.3 0.1 - 1.0 K/uL   Eosinophils Relative 5 %   Eosinophils Absolute 0.2 0.0 - 0.5 K/uL   Basophils Relative 1 %   Basophils Absolute 0.0 0.0 - 0.1 K/uL   Immature Granulocytes 0 %   Abs Immature Granulocytes 0.01 0.00 - 0.07 K/uL    Comment: Performed at [facility], [address] [place], [place] [address]  Brain natriuretic peptide     Status: None   Collection Time: 07/13/22  7:09 AM  Result Value  Ref Range   B Natriuretic Peptide 57.0 0.0 - 100.0 pg/mL    Comment: Performed at [facility], [address] [place], [place] [address]  Basic metabolic panel     Status: Abnormal   Collection Time: 07/13/22  7:09 AM  Result Value Ref Range   Sodium 137 135 - 145 mmol/L   Potassium 3.7 3.5 - 5.1 mmol/L   Chloride 104 98 - 111 mmol/L   CO2 24 22 - 32 mmol/L  Glucose, Bld 99 70 - 99 mg/dL    Comment: Glucose reference range applies only to samples taken after fasting for at least 8 hours.   BUN 7 6 - 20 mg/dL   Creatinine, Ser 5.36 0.61 - 1.24 mg/dL   Calcium 8.5 (L) 8.9 - 10.3 mg/dL   GFR, Estimated >64 >40 mL/min    Comment: (NOTE) Calculated using the CKD-EPI Creatinine Equation (2021)    Anion gap 9 5 - 15    Comment: Performed at [facility], [address], [place], [place] [address]  Magnesium     Status: None   Collection Time: 07/13/22  7:09 AM  Result Value Ref Range   Magnesium 2.1 1.7 - 2.4 mg/dL    Comment: Performed at [facility], [address], [place], [place] [address]  Glucose, capillary     Status: Abnormal   Collection Time: 07/13/22  1:44 PM  Result Value Ref Range   Glucose-Capillary 115 (H) 70 - 99 mg/dL    Comment: Glucose reference range applies only to samples taken after fasting for at least 8 hours.  CBC with Differential/Platelet     Status: Abnormal   Collection Time: 07/14/22  6:22 AM  Result Value Ref Range   WBC 4.1 4.0 - 10.5 K/uL   RBC 4.20 (L) 4.22 - 5.81 MIL/uL   Hemoglobin 12.3 (L) 13.0 - 17.0 g/dL   HCT 87.5 (L) 64.3 - 32.9 %   MCV 88.1 80.0 - 100.0 fL   MCH 29.3 26.0 - 34.0 pg   MCHC 33.2 30.0 - 36.0 g/dL   RDW 51.8 84.1 - 66.0 %   Platelets 110 (L) 150 - 400 K/uL    Comment: REPEATED TO VERIFY   nRBC 0.0 0.0 - 0.2 %   Neutrophils Relative % 64 %   Neutro Abs 2.6 1.7 - 7.7 K/uL   Lymphocytes Relative 26 %   Lymphs Abs 1.1 0.7 - 4.0 K/uL   Monocytes Relative 7 %   Monocytes Absolute 0.3 0.1 - 1.0 K/uL    Eosinophils Relative 2 %   Eosinophils Absolute 0.1 0.0 - 0.5 K/uL   Basophils Relative 1 %   Basophils Absolute 0.0 0.0 - 0.1 K/uL   Immature Granulocytes 0 %   Abs Immature Granulocytes 0.01 0.00 - 0.07 K/uL    Comment: Performed at [facility], [address], [place], [place] [address]  Brain natriuretic peptide     Status: None   Collection Time: 07/14/22  6:22 AM  Result Value Ref Range   B Natriuretic Peptide 49.1 0.0 - 100.0 pg/mL    Comment: Performed at [facility], [address], [place], [place] [address]  Basic metabolic panel     Status: Abnormal   Collection Time: 07/14/22  6:22 AM  Result Value Ref Range   Sodium 135 135 - 145 mmol/L   Potassium 3.5 3.5 - 5.1 mmol/L   Chloride 107 98 - 111 mmol/L   CO2 20 (L) 22 - 32 mmol/L   Glucose, Bld 106 (H) 70 - 99 mg/dL    Comment: Glucose reference range applies only to samples taken after fasting for at least 8 hours.   BUN 8 6 - 20 mg/dL   Creatinine, Ser 2.35 0.61 - 1.24 mg/dL   Calcium 8.6 (L) 8.9 - 10.3 mg/dL   GFR, Estimated >57 >32 mL/min    Comment: (NOTE) Calculated using the CKD-EPI Creatinine Equation (2021)  Anion gap 8 5 - 15    Comment: Performed at [facility], [address] [place], [place] [address]  Magnesium     Status: None   Collection Time: 07/14/22  6:22 AM  Result Value Ref Range   Magnesium 2.1 1.7 - 2.4 mg/dL    Comment: Performed at [facility], [address], [place], [place] [address]   EEG adult  Result Date: 07/13/2022 Charlsie Quest, MD     07/13/2022 10:05 PM Patient Name: Frankin Wilkins MRN: [id number] Epilepsy Attending: Charlsie Quest Referring Physician/Provider: Leroy Sea, MD Date: 07/13/2022 Duration: 23.28 mins Patient history:  62 year old male with a 6-week history of increasing left-sided weakness and hemisensory deficits getting eeg to evaluate for seizure. Level of alertness: Awake, asleep AEDs during EEG study: None Technical aspects:  This EEG study was done with scalp electrodes positioned according to the 10-20 International system of electrode placement. Electrical activity was reviewed with band pass filter of 1-70Hz , sensitivity of 7 uV/mm, display speed of 52mm/sec with a 60Hz  notched filter applied as appropriate. EEG data were recorded continuously and digitally stored.  Video monitoring was available and reviewed as appropriate. Description: The posterior dominant rhythm consists of 9 Hz activity of moderate voltage (25-35 uV) seen predominantly in posterior head regions, symmetric and reactive to eye opening and eye closing. Sleep was characterized by vertex waves, sleep spindles (12 to 14 Hz), maximal frontocentral region. Physiologic photic driving was seen during photic stimulation.  Hyperventilation was not performed.   IMPRESSION: This study is within normal limits. No seizures or epileptiform discharges were seen throughout the recording. A normal interictal EEG does not exclude the diagnosis of epilepsy. Priyanka O Yadav      Blood pressure 123/79, pulse 87, temperature 99.6 F (37.6 C), temperature source Oral, resp. rate (!) 21, height 5\' 11"  (1.803 m), weight 104.3 kg, SpO2 96 %.  Medical Problem List and Plan: 1. 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 Pt with subsequent left greater than right weakness and sensory loss particularly involving the lower extremities.   -patient may shower  -ELOS/Goals: 12-15 days, supervision goals with PT, OT. SLP can be held in the short term  -pt wants to go home but does have awareness that he and his wife cannot manage on their own while he's at this current functional state. Wants to participate in rehab.   2.  Antithrombotics: -DVT/anticoagulation:  Pharmaceutical: Heparin  -antiplatelet therapy: none  3. Pain Management: Tylenol as needed  4. Mood/Behavior/Sleep: LCSW to  evaluate and provide emotional support  -antipsychotic agents: n/a  -pt will need regular reassurance of team/family.   -will add xanax 0.5mg  q12 prn for anxiety 5. Neuropsych/cognition: This patient is capable of making decisions on his own behalf.  6. Skin/Wound Care: Routine skin care checks   7. 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 fib: no new onset or AC  15: DM-2; A1c = 6.6% on  04/10/2022; (no home meds listed)  -serum glucose range: 89-119      Milinda Antis, [name] 07/14/2022

## 2022-07-14 NOTE — Progress Notes (Signed)
MEWS Progress Note  Patient Details Name: Jose Lamb MRN: [id number] DOB: [birth year] Today's Date: 07/14/2022   MEWS Flowsheet Documentation:  Assess: MEWS Score Temp: (!) 101.4 F (38.6 C) BP: (!) 146/95 MAP (mmHg): 110 Pulse Rate: (!) 117 Resp: (!) 23 Level of Consciousness: Alert SpO2: 90 % O2 Device: Nasal Cannula O2 Flow Rate (L/min): 2 L/min Assess: MEWS Score MEWS Temp: 1 MEWS Systolic: 0 MEWS Pulse: 2 MEWS RR: 1 MEWS LOC: 0 MEWS Score: 4 MEWS Score Color: Red Assess: SIRS CRITERIA SIRS Temperature : 1 SIRS Respirations : 1 SIRS Pulse: 1 SIRS WBC: 0 SIRS Score Sum : 3 SIRS Temperature : 1 SIRS Pulse: 1 SIRS Respirations : 1 SIRS WBC: 0 SIRS Score Sum : 3 Assess: if the MEWS score is Yellow or Red Focused Assessment: Change from prior assessment (see assessment flowsheet) Does the patient meet 2 or more of the SIRS criteria?: Yes Does the patient have a confirmed or suspected source of infection?: No MEWS guidelines implemented : Yes, red Treat MEWS Interventions: Considered administering scheduled or prn medications/treatments as ordered Take Vital Signs Increase Vital Sign Frequency : Red: Q1hr x2, continue Q4hrs until patient remains green for 12hrs Escalate MEWS: Escalate: Red: Discuss with charge nurse and notify provider. Consider notifying RRT. If remains red for 2 hours consider need for higher level of care Notify: Charge Nurse/RN Name of Charge Nurse/RN Notified: Corazon Provider Notification Provider Name/Title: Rhona Raider Date Provider Notified: 07/14/22 Time Provider Notified: 1950 Method of Notification: Call Notification Reason: Change in status Provider response: Other (Comment) (Call Rapid Response) Date of Provider Response: 07/14/22 Time of Provider Response: 1954 Notify: Rapid Response Name of Rapid Response RN Notified: Mindy, RN Date Rapid Response Notified: 07/14/22 Time Rapid Response Notified:  1954      Tennis Must 07/14/2022, 7:59 PM

## 2022-07-14 NOTE — Progress Notes (Signed)
Report called to [address] west for patient transferring. Report given to [facility].

## 2022-07-14 NOTE — Progress Notes (Signed)
ANTICOAGULATION CONSULT NOTE - Initial Consult  Pharmacy Consult for heparin Indication: pulmonary embolus  Allergies  Allergen Reactions   Asa [Aspirin] Other (See Comments)    Epistaxis    Patient Measurements: Height: 5\' 11"  (180.3 cm) Weight: 95.3 kg (210 lb 1.6 oz) IBW/kg (Calculated) : 75.3  Vital Signs: Temp: 98.9 F (37.2 C) (06/07 2118) Temp Source: Oral (06/07 2118) BP: 135/88 (06/07 2118) Pulse Rate: 106 (06/07 2118)  Labs: Recent Labs    07/13/22 0709 07/14/22 0622 07/14/22 2055  HGB 12.4* 12.3* 13.1  HCT 37.2* 37.0* 39.4  PLT 106* 110* 116*  CREATININE 0.89 0.81 0.90    Estimated Creatinine Clearance: 102.8 mL/min (by C-G formula based on SCr of 0.9 mg/dL).   Medical History: Past Medical History:  Diagnosis Date   Atrial fibrillation (HCC)    In the setting of hyperthyroidism   Bilateral pulmonary embolism (HCC)    Diagnosed August 2021   Diabetes mellitus without complication (HCC)    Essential hypertension    HIV antibody positive (HCC)    Perforated duodenal ulcer (HCC) 2015   Contained without need for surgical intervention   Pneumonia due to COVID-19 virus    Diagnosed August 2021    Medications:  Medications Prior to Admission  Medication Sig Dispense Refill Last Dose   albuterol (PROVENTIL) (2.5 MG/3ML) 0.083% nebulizer solution Take 2.5 mg by nebulization every 4 (four) hours.      albuterol (VENTOLIN HFA) 108 (90 Base) MCG/ACT inhaler 1 puff every 4 (four) hours as needed for wheezing or shortness of breath.      ascorbic acid (VITAMIN C) 500 MG tablet Take 1 tablet (500 mg total) by mouth daily. 30 tablet 1    bictegravir-emtricitabine-tenofovir AF (BIKTARVY) 50-200-25 MG TABS tablet Take 1 tablet by mouth daily. 30 tablet 2    cyanocobalamin 1000 MCG tablet Take 1 tablet (1,000 mcg total) by mouth daily. 30 tablet 0    oxyCODONE-acetaminophen (PERCOCET) 10-325 MG tablet Take 1 tablet by mouth every 6 (six) hours.       rosuvastatin (CRESTOR) 10 MG tablet Take 1 tablet (10 mg total) by mouth daily. 30 tablet 0    sulfamethoxazole-trimethoprim (BACTRIM DS) 800-160 MG tablet Take 1 tablet by mouth daily. 30 tablet 2    valACYclovir (VALTREX) 1000 MG tablet Take 1 tablet (1,000 mg total) by mouth 3 (three) times daily for 9 days. 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  Pt has Afib but not on [facility]; had previously been on apixaban for PE in 2021 thought to be provoked by Covid.  Goal of Therapy:  Heparin level 0.3-0.7 units/ml Monitor platelets by anticoagulation protocol: Yes   Plan:  Heparin 4000 units IV bolus x1 followed by infusion at 1600 units/hr. Monitor heparin levels and CBC.  Vernard Gambles, PharmD, BCPS  07/14/2022,11:13 PM

## 2022-07-14 NOTE — Progress Notes (Signed)
PROGRESS NOTE                                                                                                                                                                                                             Patient Demographics:    Jose Lamb, is a 62 y.o. male, DOB - 03-14-1960, ZOX:[id number]  Outpatient Primary MD for the patient is Ladon Applebaum    LOS - 7  Admit date - 07/07/2022    Chief Complaint  Patient presents with   Weakness       Brief Narrative (HPI from H&P)    62 y.o. male with medical history significant for prior CVA, paroxysmal atrial fibrillation not on oral anticoagulation, asthma, HIV with noncompliance, history of pulmonary embolism, who presents to [facility] ED with complaints of 6 weeks of left-sided weakness involving his left arm and left leg.  Associated with frequent falls.  Has not been taking his ART medications.  MRI of the brain showing right-sided T2 hypertense signal on the MRI primarily cortical/subcortical, in the right greater than left insula and postcentral gyrus, neurology and ID were consulted and he was admitted to the hospital for further care.   Subjective:   Patient in bed, appears comfortable, denies any headache, no fever, no chest pain or pressure, no shortness of breath , no abdominal pain. No new focal weakness. Improved +++ L Leg weakness   Assessment  & Plan :    Left-sided weakness leg significantly more weak than the arm.  MRI of the brain suggestive of T2 hypertense signal primarily cortical/subcortical, in the right greater than left insula and postcentral gyrus, neurology and ID on board, he was noncompliant with his HIV medications.  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  Continue supportive care with PT OT.  Left lower extremity weakness much improved since 07/12/2022, left upper extremity now minimally weak only.  Patient is deciding between going to CIR versus home health with PT, will wait for his decision.  Post discharge follow-up with ID and neurology in 1 month.   HIV - AIDs.  CD4 count  less than 54 with CD4 helper T-cell percentage 5%.  Noncompliant with medications, counseled on compliance, ID on board defer management to ID.  He is now agreeable to taking his HIV medications as prescribed, see above.  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  Per ID requested ENT input, per ENT CT guided biopsy by IR if required or else outpatient ENT follow-up.  Discussed with Dr. Murlean Hark and ID physician Dr. Gwynn Burly.  He is s/p CT-guided biopsy of both parotid glands by IR on 07/10/2022, bilateral parotid gland FNAC done by IR appears unremarkable, prelim results noted on 07/12/2022, CSF autoimmune panel appears negative, CSF flow cytometry appears stable as well.  Dyslipidemia.  Placed on statin.  Asthma.  Stable no acute issues.  Obesity.  BMI 32.  Follow-up with PCP for weight loss.      Condition -   Guarded  Family Communication  : Updated wife on 07/11/2022  Code Status :  Full  Consults  :  ID, Neuro, ENT, IR  PUD Prophylaxis :    Procedures  :     MRI - 1. Abnormal T2 hyperintense signal, primarily cortical/subcortical, in the right greater than left insula and postcentral gyrus, as well as in the splenium and left body of the corpus callosum and possibly the right thalamus and midbrain. This is nonspecific, and while this could reflect sequela of prior infarcts, it is also concerning for an inflammatory or infectious process, such as PML, autoimmune encephalitis, or herpes simplex encephalitis, although this is not particularly typical of any the entities. MRI with contrast  could be helpful. Correlate with symptoms and consider CSF testing. 2. Bilateral parotid masses are better seen on the prior CT and are incompletely imaged on this exam.      Disposition Plan  :      DVT Prophylaxis  :    Place TED hose Start: 07/13/22 1403 heparin injection 5,000 Units Start: 07/08/22 1400    Lab Results  Component Value Date   PLT 110 (L) 07/14/2022    Diet :  Diet Order             Diet regular Room service appropriate? Yes; Fluid consistency: Thin  Diet effective now                    Inpatient Medications  Scheduled Meds:  bictegravir-emtricitabine-tenofovir AF  1 tablet Oral Daily   vitamin B-12  2,000 mcg Oral Daily   heparin  5,000 Units Subcutaneous Q8H   rosuvastatin  10 mg Oral Daily   sulfamethoxazole-trimethoprim  1 tablet Oral Daily   valACYclovir  1,000 mg Oral Q8H   Continuous Infusions:   PRN Meds:.acetaminophen, albuterol, guaiFENesin-dextromethorphan, HYDROmorphone (DILAUDID) injection, mouth rinse, oxyCODONE, polyethylene glycol, prochlorperazine  Antibiotics  :    Anti-infectives (From admission, onward)    Start     Dose/Rate Route Frequency Ordered Stop   07/13/22 1115  valACYclovir (VALTREX) tablet 1,000 mg        1,000 mg Oral Every 8 hours 07/13/22 1016 07/23/22 0559   07/13/22 0000  valACYclovir (VALTREX) 1000 MG tablet        1,000 mg Oral 3 times daily 07/13/22 1025 07/22/22 2359   07/13/22 0000  bictegravir-emtricitabine-tenofovir AF (BIKTARVY) 50-200-25 MG TABS tablet        1 tablet Oral Daily 07/13/22 1025     07/13/22 0000  sulfamethoxazole-trimethoprim (BACTRIM DS) 800-160 MG tablet  1 tablet Oral Daily 07/13/22 1025     07/10/22 1615  bictegravir-emtricitabine-tenofovir AF (BIKTARVY) 50-200-25 MG per tablet 1 tablet        1 tablet Oral Daily 07/10/22 1515     07/10/22 1615  sulfamethoxazole-trimethoprim (BACTRIM DS) 800-160 MG per tablet 1 tablet        1 tablet Oral Daily 07/10/22 1522      07/08/22 2315  acyclovir (ZOVIRAX) 1,000 mg in dextrose 5 % 250 mL IVPB  Status:  Discontinued        1,000 mg 270 mL/hr over 60 Minutes Intravenous Every 8 hours 07/08/22 2221 07/08/22 2229   07/08/22 2315  acyclovir (ZOVIRAX) 870 mg in dextrose 5 % 250 mL IVPB  Status:  Discontinued        10 mg/kg  86.9 kg (Adjusted) 267.4 mL/hr over 60 Minutes Intravenous Every 8 hours 07/08/22 2229 07/13/22 1016         Objective:   Vitals:   07/13/22 2100 07/14/22 0029 07/14/22 0451 07/14/22 0800  BP: 122/79 109/69 123/79   Pulse: 81 85 87   Resp: (!) 28 (!) 28 (!) 21   Temp: 98.9 F (37.2 C) 98.9 F (37.2 C) 98.6 F (37 C) 99.6 F (37.6 C)  TempSrc: Oral Oral Oral Oral  SpO2: 96%     Weight:      Height:        Wt Readings from Last 3 Encounters:  07/07/22 104.3 kg  07/05/22 104.3 kg  04/08/22 106.7 kg     Intake/Output Summary (Last 24 hours) at 07/14/2022 0951 Last data filed at 07/14/2022 0900 Gross per 24 hour  Intake 240 ml  Output 2625 ml  Net -2385 ml     Physical Exam  Awake Alert, No new F.N deficits, L leg 2/5, L arm mildly weak [place].AT,PERRAL Supple Neck, No JVD,   Symmetrical Chest wall movement, Good air movement bilaterally, CTAB RRR,No Gallops,Rubs or new Murmurs,  +ve B.Sounds, Abd Soft, No tenderness,   No Cyanosis, Clubbing or edema        Data Review:    Recent Labs  Lab 07/07/22 1819 07/07/22 1841 07/08/22 0500 07/11/22 0216 07/12/22 0630 07/13/22 0709 07/14/22 0622  WBC 4.1  --  3.9* 5.3 3.6* 3.9* 4.1  HGB 13.1   < > 12.1* 12.5* 12.1* 12.4* 12.3*  HCT 39.9   < > 37.7* 37.7* 36.4* 37.2* 37.0*  PLT 112*  --  108* 97* 92* 106* 110*  MCV 91.5  --  91.7 87.1 87.1 87.1 88.1  MCH 30.0  --  29.4 28.9 28.9 29.0 29.3  MCHC 32.8  --  32.1 33.2 33.2 33.3 33.2  RDW 14.4  --  14.4 13.6 13.5 13.4 13.3  LYMPHSABS 1.0  --   --  1.4 1.1 1.3 1.1  MONOABS 0.6  --   --  0.6 0.3 0.3 0.3  EOSABS 0.2  --   --  0.0 0.2 0.2 0.1  BASOSABS 0.0  --   --  0.0  0.0 0.0 0.0   < > = values in this interval not displayed.    Recent Labs  Lab 07/07/22 1819 07/07/22 1841 07/08/22 0500 07/08/22 1405 07/09/22 0323 07/10/22 0546 07/11/22 0216 07/12/22 0630 07/13/22 0709 07/14/22 0622  NA 135   < > 138  --    < > 134* 132* 134* 137 135  K 3.4*   < > 3.2*  --    < > 3.8 3.8 3.6 3.7  3.5  CL 102   < > 105  --    < > 102 99 102 104 107  CO2 25  --  28  --    < > 26 22 23 24  20*  ANIONGAP 8  --  5  --    < > 6 11 9 9 8   GLUCOSE 95   < > 104*  --    < > 99 111* 119* 99 106*  BUN 14   < > 13  --    < > 7 7 7 7 8   CREATININE 0.78   < > 0.79  --    < > 0.75 0.83 0.87 0.89 0.81  AST 21  --   --   --   --   --   --   --   --   --   ALT 24  --   --   --   --   --   --   --   --   --   ALKPHOS 67  --   --   --   --   --   --   --   --   --   BILITOT 0.6  --   --   --   --   --   --   --   --   --   ALBUMIN 3.6  --   --  NOT PERFORMED  --   --   --   --   --   --   INR 1.0  --   --   --   --  1.1  --   --   --   --   BNP  --   --   --   --   --   --  70.9 31.9 57.0 49.1  MG  --   --  2.3  --   --   --  2.0 2.3 2.1 2.1  CALCIUM 8.4*  --  8.6*  --    < > 8.6* 8.6* 8.4* 8.5* 8.6*   < > = values in this interval not displayed.      Recent Labs  Lab 07/07/22 1819 07/08/22 0500 07/09/22 0323 07/10/22 0546 07/11/22 0216 07/12/22 0630 07/13/22 0709 07/14/22 0622  INR 1.0  --   --  1.1  --   --   --   --   BNP  --   --   --   --  70.9 31.9 57.0 49.1  MG  --  2.3  --   --  2.0 2.3 2.1 2.1  CALCIUM 8.4* 8.6*   < > 8.6* 8.6* 8.4* 8.5* 8.6*   < > = values in this interval not displayed.     Lab Results  Component Value Date   CHOL 197 04/10/2022   HDL 39 (L) 04/10/2022   LDLCALC 126 (H) 04/10/2022   TRIG 158 (H) 04/10/2022   CHOLHDL 5.1 04/10/2022    Lab Results  Component Value Date   HGBA1C 6.6 (H) 04/10/2022      Micro Results Recent Results (from the past 240 hour(s))  Urine Culture     Status: Abnormal   Collection Time: 07/05/22   9:19 AM   Specimen: Urine, Clean Catch  Result Value Ref Range Status   Specimen Description   Final    URINE, CLEAN CATCH Performed at [facility], [address], [place], [place] [address]  Special Requests   Final    NONE Reflexed from [id number] Performed at [facility], [address], [place], [place] [address]    Culture MULTIPLE SPECIES PRESENT, SUGGEST RECOLLECTION (A)  Final   Report Status 07/06/2022 FINAL  Final  Blood Culture (routine x 2)     Status: None   Collection Time: 07/05/22  9:46 AM   Specimen: Left Antecubital; Blood  Result Value Ref Range Status   Specimen Description LEFT ANTECUBITAL  Final   Special Requests   Final    BOTTLES DRAWN AEROBIC AND ANAEROBIC Blood Culture results may not be optimal due to an excessive volume of blood received in culture bottles   Culture   Final    NO GROWTH 6 DAYS Performed at [facility], [address], [place], [place] [address]    Report Status 07/11/2022 FINAL  Final  Blood Culture (routine x 2)     Status: None   Collection Time: 07/05/22  9:46 AM   Specimen: BLOOD RIGHT ARM  Result Value Ref Range Status   Specimen Description BLOOD RIGHT ARM  Final   Special Requests   Final    BOTTLES DRAWN AEROBIC AND ANAEROBIC Blood Culture results may not be optimal due to an excessive volume of blood received in culture bottles   Culture   Final    NO GROWTH 6 DAYS Performed at [facility], [address], [place], [place] [address]    Report Status 07/11/2022 FINAL  Final  Resp panel by RT-PCR (RSV, Flu A&B, Covid) Anterior Nasal Swab     Status: None   Collection Time: 07/05/22  9:47 AM   Specimen: Anterior Nasal Swab  Result Value Ref Range Status   SARS Coronavirus 2 by RT PCR NEGATIVE NEGATIVE Final    Comment: (NOTE) SARS-CoV-2 target nucleic acids are NOT DETECTED.  The SARS-CoV-2 RNA is generally detectable in upper respiratory specimens during the acute phase of infection. The lowest concentration of  SARS-CoV-2 viral copies this assay can detect is 138 copies/mL. A negative result does not preclude SARS-Cov-2 infection and should not be used as the sole basis for treatment or other patient management decisions. A negative result may occur with  improper specimen collection/handling, submission of specimen other than nasopharyngeal swab, presence of viral mutation(s) within the areas targeted by this assay, and inadequate number of viral copies(<138 copies/mL). A negative result must be combined with clinical observations, patient history, and epidemiological information. The expected result is Negative.  Fact Sheet for Patients:  BloggerCourse.com  Fact Sheet for Healthcare Providers:  SeriousBroker.it  This test is no t yet approved or cleared by the [place] FDA and  has been authorized for detection and/or diagnosis of SARS-CoV-2 by FDA under an Emergency Use Authorization (EUA). This EUA will remain  in effect (meaning this test can be used) for the duration of the COVID-19 declaration under Section 564(b)(1) of the Act, 21 U.S.C.section 360bbb-3(b)(1), unless the authorization is terminated  or revoked sooner.       Influenza A by PCR NEGATIVE NEGATIVE Final   Influenza B by PCR NEGATIVE NEGATIVE Final    Comment: (NOTE) The Xpert Xpress SARS-CoV-2/FLU/RSV plus assay is intended as an aid in the diagnosis of influenza from Nasopharyngeal swab specimens and should not be used as a sole basis for treatment. Nasal washings and aspirates are unacceptable for Xpert Xpress SARS-CoV-2/FLU/RSV testing.  Fact Sheet for Patients: BloggerCourse.com  Fact Sheet for Healthcare Providers: SeriousBroker.it  This test is  not yet approved or cleared by the [place] and has been authorized for detection and/or diagnosis of SARS-CoV-2 by FDA under an Emergency Use  Authorization (EUA). This EUA will remain in effect (meaning this test can be used) for the duration of the COVID-19 declaration under Section 564(b)(1) of the Act, 21 U.S.C. section 360bbb-3(b)(1), unless the authorization is terminated or revoked.     Resp Syncytial Virus by PCR NEGATIVE NEGATIVE Final    Comment: (NOTE) Fact Sheet for Patients: BloggerCourse.com  Fact Sheet for Healthcare Providers: SeriousBroker.it  This test is not yet approved or cleared by the [place] FDA and has been authorized for detection and/or diagnosis of SARS-CoV-2 by FDA under an Emergency Use Authorization (EUA). This EUA will remain in effect (meaning this test can be used) for the duration of the COVID-19 declaration under Section 564(b)(1) of the Act, 21 U.S.C. section 360bbb-3(b)(1), unless the authorization is terminated or revoked.  Performed at [facility], [address] [place], [place] [address]   Urine Culture     Status: Abnormal   Collection Time: 07/07/22  7:21 PM   Specimen: Urine, Clean Catch  Result Value Ref Range Status   Specimen Description   Final    URINE, CLEAN CATCH Performed at [facility], [address] [place], [place] [address]    Special Requests   Final    NONE Performed at [facility], [address], [place], [place] [address]    Culture (A)  Final    <10,000 COLONIES/mL INSIGNIFICANT GROWTH Performed at [facility], [address], [place], [place] [address]    Report Status 07/08/2022 FINAL  Final  CSF culture w Gram Stain     Status: None   Collection Time: 07/08/22 12:30 PM   Specimen: CSF; Cerebrospinal Fluid  Result Value Ref Range Status   Specimen Description   Final    CSF Performed at [facility], [address], [place], [place] [address]    Special Requests   Final    NONE Performed at [facility], [address], [place], [place] [address]    Gram Stain   Final    WBC PRESENT,BOTH PMN AND MONONUCLEAR NO ORGANISMS SEEN CYTOSPIN Gram Stain Report Called to,Read Back By and Verified With: Derl Barrow RN @1425  ON 6.1.2024 BY [facility] Performed at [facility], [address] [place], [place] [address]    Culture   Final    NO GROWTH 3 DAYS Performed at [facility], [address], [place], [place] [address]    Report Status 07/12/2022 FINAL  Final    Radiology Reports EEG adult  Result Date: 07/13/2022 Charlsie Quest, MD     07/13/2022 10:05 PM Patient Name: Peirce Verhelst MRN: [id number] Epilepsy Attending: Charlsie Quest Referring Physician/Provider: Leroy Sea, MD Date: 07/13/2022 Duration: 23.28 mins Patient history:  62 year old male with a 6-week history of increasing left-sided weakness and hemisensory deficits getting eeg to evaluate for seizure. Level of alertness: Awake, asleep AEDs during EEG study: None Technical aspects: This EEG study was done with scalp electrodes positioned according to the 10-20 International system of electrode placement. Electrical activity was reviewed with band pass filter of 1-70Hz , sensitivity of 7 uV/mm, display speed of 50mm/sec with a 60Hz  notched filter applied as appropriate. EEG data were recorded continuously and digitally stored.  Video monitoring was available and reviewed as appropriate. Description: The  posterior dominant rhythm consists of 9 Hz activity of moderate voltage (25-35 uV) seen predominantly in posterior head regions, symmetric and reactive to eye opening and eye closing. Sleep was characterized by vertex waves, sleep spindles (12 to 14 Hz), maximal frontocentral region. Physiologic photic driving was seen during photic stimulation.  Hyperventilation was not performed.   IMPRESSION: This study is within normal limits. No seizures or epileptiform discharges were seen throughout the recording.  A normal interictal EEG does not exclude the diagnosis of epilepsy. Priyanka Annabelle Harman   [place] FNA SALIVARY GLAND/PAROTID GLAND  Result Date: 07/11/2022 INDICATION: Indeterminate bilateral parotid cysts. Please perform ultrasound-guided aspiration for tissue diagnostic purposes. EXAM: 1. ULTRASOUND-GUIDED FINE-NEEDLE ASPIRATION OF RIGHT PAROTID CYST 2. ULTRASOUND-GUIDED FINE-NEEDLE ASPIRATION OF THE LEFT PAROTID CYST COMPARISON:  Brain MRI-06/27/2022; cervical spine CT-01/30/2021 MEDICATIONS: None ANESTHESIA/SEDATION: None CONTRAST:  None COMPLICATIONS: None immediate. PROCEDURE: Informed written consent was obtained from the patient after a discussion of the risks, benefits and alternatives to treatment. Preprocedural ultrasound scanning demonstrated an approximately 4.4 x 2.6 cm minimally complex right-sided parotid cyst (image 4), as well as an approximately 3.3 x 2.0 cm left-sided parotid cyst (image 8). A timeout was performed prior to the initiation of the procedure. The skin overlying the operative sites were prepped and draped in usual sterile fashion. Attention was first paid towards aspiration of the right-sided parotid cyst. After the overlying soft tissues were anesthetized with 1% lidocaine with epinephrine, an 18 gauge trocar needle was advanced into the cyst. Multiple ultrasound images were saved procedural documentation purposes. Next, a proximally 25 cc of tan colored fluid was aspirated from the right parotid cyst. Postprocedural imaging was obtained demonstrating near complete resolution of the right-sided parotid cyst. The identical procedure was then performed left-sided parotid cyst yielding 10 cc of similar appearing tan colored fluid. Postprocedural imaging was obtained demonstrating near complete resolution of the left-sided parotid cyst. All aspirated fluid was capped and sent separately to the laboratory for analysis. Dressings were applied. The patient tolerated the procedure well without  immediate postprocedural complication. IMPRESSION: 1. Successful ultrasound-guided aspiration of 25 cc of tan colored fluid from the right-sided parotid cyst. 2. Successful ultrasound-guided aspiration of 10 cc of tan colored fluid from the left-sided parotid cyst. 3. All aspirated fluid was capped and sent separately to the laboratory for analysis. Electronically Signed   By: Simonne Come M.D.   On: 07/11/2022 10:36   [place] FNA BIOPSY SALIVARY GLAND PAROTID GLAND EA ADDT'L LESION  Result Date: 07/11/2022 INDICATION: Indeterminate bilateral parotid cysts. Please perform ultrasound-guided aspiration for tissue diagnostic purposes. EXAM: 1. ULTRASOUND-GUIDED FINE-NEEDLE ASPIRATION OF RIGHT PAROTID CYST 2. ULTRASOUND-GUIDED FINE-NEEDLE ASPIRATION OF THE LEFT PAROTID CYST COMPARISON:  Brain MRI-06/27/2022; cervical spine CT-01/30/2021 MEDICATIONS: None ANESTHESIA/SEDATION: None CONTRAST:  None COMPLICATIONS: None immediate. PROCEDURE: Informed written consent was obtained from the patient after a discussion of the risks, benefits and alternatives to treatment. Preprocedural ultrasound scanning demonstrated an approximately 4.4 x 2.6 cm minimally complex right-sided parotid cyst (image 4), as well as an approximately 3.3 x 2.0 cm left-sided parotid cyst (image 8). A timeout was performed prior to the initiation of the procedure. The skin overlying the operative sites were prepped and draped in usual sterile fashion. Attention was first paid towards aspiration of the right-sided parotid cyst. After the overlying soft tissues were anesthetized with 1% lidocaine with epinephrine, an 18 gauge trocar needle was advanced into the cyst. Multiple ultrasound images were saved procedural documentation purposes. Next, a proximally 25 cc  of tan colored fluid was aspirated from the right parotid cyst. Postprocedural imaging was obtained demonstrating near complete resolution of the right-sided parotid cyst. The identical procedure was  then performed left-sided parotid cyst yielding 10 cc of similar appearing tan colored fluid. Postprocedural imaging was obtained demonstrating near complete resolution of the left-sided parotid cyst. All aspirated fluid was capped and sent separately to the laboratory for analysis. Dressings were applied. The patient tolerated the procedure well without immediate postprocedural complication. IMPRESSION: 1. Successful ultrasound-guided aspiration of 25 cc of tan colored fluid from the right-sided parotid cyst. 2. Successful ultrasound-guided aspiration of 10 cc of tan colored fluid from the left-sided parotid cyst. 3. All aspirated fluid was capped and sent separately to the laboratory for analysis. Electronically Signed   By: Simonne Come M.D.   On: 07/11/2022 10:36      Signature  -   Susa Raring M.D on 07/14/2022 at 9:51 AM   -  To page go to [email or web address]

## 2022-07-14 NOTE — PMR Pre-admission (Signed)
PMR Admission Coordinator Pre-Admission Assessment  Patient: Jose Lamb is an 62 y.o., male MRN: [id number] DOB: 1960-04-15 Height: 5\' 11"  (180.3 cm) Weight: 104.3 kg              Insurance Information HMO:     PPO: yes     PCP:      IPA:      80/20:      OTHER:  PRIMARY: BCBS      Policy#: [id number]      Subscriber:  CM Name: Sharyn Lull      Phone#: [phone]     Fax#: [phone]/ [phone] Pre-Cert#: [id number]      Employer:  Benefits:  Phone #: [phone]     Name:  Eff. Date: 05/08/22     Deduct: $1000 ($0 met)      Out of Pocket Max: $2500 (met $106.51)      Life Max: n/a  CIR: 85%      SNF: 85% Outpatient: 85%     Co-Pay: 15% Home Health: 85%      Co-Pay: 15% DME: 85%     Co-Pay: 15% Providers:  SECONDARY:       Policy#:       Phone#:   Artist:       Phone#:   The Engineer, materials Information Summary" for patients in Inpatient Rehabilitation Facilities with attached "Privacy Act Statement-Health Care Records" was provided and verbally reviewed with: N/A  Emergency Contact Information Contact Information     Name Relation Home Work Mobile   [place] Spouse [phone]  [phone]   [place] Peter   [phone]      Current Medical History  Patient Admitting Diagnosis: PML  History of Present Illness: Jose Lamb is a 62 y.o. male with a history of a prior CVA as well as paroxysmal atrial fibrillation (not on a/c), asthma, HIV with noncompliance, pulmonary embolism who presented on 07/07/2022 with a 6-week history of increasing left-sided weakness of his arm and leg.  He was having frequent falls as well.  Patient reportedly was not taking his meds for HIV.  MRI of the brain was done at the time which demonstrated T2 signal hyperintensity in the right greater than left insula and postcentral gyrus.  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  Patient currently is on Biktarvy and Bactrim per infectious disease. Therapy evaluations completed and pt was recommended for CIR.   Complete NIHSS TOTAL: 4 Glasgow Coma Scale Score: 15  Patient's medical record from [facility] has been reviewed by the rehabilitation admission coordinator and physician.  Past Medical History  Past Medical History:  Diagnosis Date   Atrial fibrillation (HCC)    In the setting of hyperthyroidism   Bilateral pulmonary embolism [facility])    Diagnosed August 2021   Diabetes mellitus without complication (HCC)    Essential hypertension    HIV antibody positive (HCC)    Perforated duodenal ulcer (HCC) 2015   Contained without need for surgical intervention   Pneumonia due to COVID-19 virus    Diagnosed August 2021    Has the patient had major surgery during 100 days prior to admission? No  Family History  family history includes Heart Problems in his mother; Ulcers in his mother.   Current Medications   Current Facility-Administered Medications:    acetaminophen (TYLENOL) tablet 650 mg, 650  mg, Oral, Q6H PRN, Darlin Drop, DO, 650 mg at 07/09/22 2001   albuterol (PROVENTIL) (2.5 MG/3ML) 0.083% nebulizer solution 2.5 mg, 2.5 mg, Nebulization, Q4H PRN, Hall, Carole N, DO   bictegravir-emtricitabine-tenofovir AF (BIKTARVY) 50-200-25 MG per tablet 1 tablet, 1 tablet, Oral, Daily, Daiva Eves, Lisette Grinder, MD, 1 tablet at 07/13/22 1136   cyanocobalamin (VITAMIN B12) tablet 2,000 mcg, 2,000 mcg, Oral, Daily, Caryl Pina, MD, 2,000 mcg at 07/13/22 1136   guaiFENesin-dextromethorphan (ROBITUSSIN DM) 100-10 MG/5ML syrup 10 mL, 10 mL, Oral, Q4H PRN, Leroy Sea, MD, 10 mL at 07/13/22 1423   heparin injection 5,000 Units, 5,000 Units, Subcutaneous, Q8H, Hall, Carole N, DO, 5,000 Units at 07/14/22 0543   HYDROmorphone (DILAUDID) injection 0.5 mg, 0.5 mg, Intravenous, Q4H PRN, Margo Aye, Carole N, DO,  0.5 mg at 07/11/22 0310   Oral care mouth rinse, 15 mL, Mouth Rinse, PRN, Dow Adolph N, DO   oxyCODONE (Oxy IR/ROXICODONE) immediate release tablet 5 mg, 5 mg, Oral, Q6H PRN, Dow Adolph N, DO, 5 mg at 07/14/22 0545   polyethylene glycol (MIRALAX / GLYCOLAX) packet 17 g, 17 g, Oral, Daily PRN, Margo Aye, Carole N, DO   prochlorperazine (COMPAZINE) injection 5 mg, 5 mg, Intravenous, Q6H PRN, Hall, Carole N, DO   rosuvastatin (CRESTOR) tablet 10 mg, 10 mg, Oral, Daily, Susa Raring K, MD, 10 mg at 07/13/22 1136   sulfamethoxazole-trimethoprim (BACTRIM DS) 800-160 MG per tablet 1 tablet, 1 tablet, Oral, Daily, Daiva Eves, Lisette Grinder, MD, 1 tablet at 07/13/22 1136   valACYclovir (VALTREX) tablet 1,000 mg, 1,000 mg, Oral, Q8H, Pham, Minh Q, RPH-CPP, 1,000 mg at 07/14/22 0543  Patients Current Diet:  Diet Order             Diet regular Room service appropriate? Yes; Fluid consistency: Thin  Diet effective now                   Precautions / Restrictions Precautions Precautions: Fall Precaution Comments: Lt hemi Restrictions Weight Bearing Restrictions: No   Has the patient had 2 or more falls or a fall with injury in the past year?Yes  Prior Activity Level Community (5-7x/wk): prior to 6 weeks ago, fully independent, working full time in heavy machinery, driving, no DME  Prior Functional Level Prior Function Prior Level of Function : Independent/Modified Independent, Needs assist, Driving, Working/employed, History of Falls (last six months) Physical Assist : Mobility (physical), ADLs (physical) Mobility (physical): Transfers, Gait ADLs (physical): Bathing, Dressing, Toileting Mobility Comments: prior to ~6 weeks ago pt was independent with no device and working full time driving/operating large equipment. Over last 6 weeks pt has had worsening weakness in Lt side and decreased sensation, pt reports at least 8 falls. He has a SPC he has been using for some ambulation in home and  power WC. pt reports he has no awareness /sense of Lt foot location and indicates he may have caught his foot under the chair. ADLs Comments: prior to ~6 weeks ago pt was independent with no AE required. Over last 6 weeks pt has required assits for transfers on/off toilet from spouse, is no longer showering due to fall in bathtub and is having assist from spouse for sink/sponge baths.  Self Care: Did the patient need help bathing, dressing, using the toilet or eating?  Independent  Indoor Mobility: Did the patient need assistance with walking from room to room (with or without device)? Independent  Stairs: Did the patient need assistance with internal or external  stairs (with or without device)? Independent  Functional Cognition: Did the patient need help planning regular tasks such as shopping or remembering to take medications? Independent  Patient Information Are you of Hispanic, Latino/a,or Spanish origin?: A. No, not of Hispanic, Latino/a, or Spanish origin What is your race?: B. Black or African American Do you need or want an interpreter to communicate with a doctor or health care staff?: 0. No  Patient's Response To:  Health Literacy and Transportation Is the patient able to respond to health literacy and transportation needs?: Yes Health Literacy - How often do you need to have someone help you when you read instructions, pamphlets, or other written material from your doctor or pharmacy?: Never In the past 12 months, has lack of transportation kept you from medical appointments or from getting medications?: No In the past 12 months, has lack of transportation kept you from meetings, work, or from getting things needed for daily living?: No  Journalist, newspaper / Equipment Home Assistive Devices/Equipment: Chief Operating Officer, Wheelchair Home Equipment: Wheelchair - power, Medical laboratory scientific officer - single point  Prior Device Use: Indicate devices/aids used by the patient prior to current illness,  exacerbation or injury? None of the above  Current Functional Level Cognition  Overall Cognitive Status: Within Functional Limits for tasks assessed Orientation Level: Oriented X4 General Comments: pt pleasant throughout session, HOH but following commands once he hears    Extremity Assessment (includes Sensation/Coordination)  Upper Extremity Assessment: RUE deficits/detail RUE Deficits / Details: overall WFL RUE Sensation: WNL RUE Coordination: WNL [name] Deficits / Details: generalized weakness, full AROM [name] Sensation: decreased light touch, decreased proprioception [name] Coordination: decreased fine motor  Lower Extremity Assessment: Defer to PT evaluation RLE Deficits / Details: WFL's 4/5 or better throughout Rt LE RLE Sensation: WNL RLE Coordination: WNL LLE Deficits / Details: 2-/5 or less for hip flex, knee flex/ext, DF/PF, pt reporting "pins & needles" sensation with light touch. pt unable to sense DF/PF with PROM from therapist. LLE Sensation: decreased proprioception, decreased light touch LLE Coordination: decreased fine motor, decreased gross motor    ADLs  Overall ADL's : Needs assistance/impaired Eating/Feeding: Set up, Sitting Grooming: Wash/dry face, Sitting, Supervision/safety Grooming Details (indicate cue type and reason): sitting EOB with unilateral UE support Upper Body Bathing: Set up, Sitting Lower Body Bathing: Moderate assistance, +2 for physical assistance, +2 for safety/equipment, Sit to/from stand Upper Body Dressing : Min guard, Sitting Lower Body Dressing: Maximal assistance, +2 for physical assistance, +2 for safety/equipment, Sit to/from stand Toilet Transfer: Moderate assistance, +2 for safety/equipment, +2 for physical assistance, Stand-pivot, BSC/3in1 Toilet Transfer Details (indicate cue type and reason): transfer from [facility] towards R Toileting- Clothing Manipulation and Hygiene: Total assistance, +2 for physical assistance, +2 for  safety/equipment, Sit to/from stand Toileting - Clothing Manipulation Details (indicate cue type and reason): total A for pericare post BM, required BUE support on RW for stability during task Functional mobility during ADLs: Moderate assistance, +2 for physical assistance, +2 for safety/equipment, Rolling walker (2 wheels) General ADL Comments: limited by L hemiplegia and decreased activity tolerance    Mobility  Overal bed mobility: Needs Assistance Bed Mobility: Supine to Sit Supine to sit: Min assist, HOB elevated Sit to supine: Max assist General bed mobility comments: HOB elevated and use of bed rail, minA for trunk support. Increased time to complete, with mild cues to fully scoot to the EOB    Transfers  Overall transfer level: Needs assistance Equipment used: Rolling walker (2 wheels), 1 person hand  held assist Transfers: Sit to/from Stand, Bed to chair/wheelchair/BSC Sit to Stand: Mod assist, Min assist, From elevated surface Bed to/from chair/wheelchair/BSC transfer type:: Step pivot Stand pivot transfers: Max assist, +2 physical assistance Step pivot transfers: Mod assist, Max assist, From elevated surface General transfer comment: minA to stand from elevated bed, with HHA, standing from chair x5 reps and BSC x1 rep, ranging from min-modA for power up and steady with trials. Performing step pivot transfer to chair, BSC, and back to chair with mod to maxA. modA when transferring to the L with maxA needed for R transfer for balance and sequencing. Cued for proper hand placement and technique with each transfer. Pt continues with left lateral lean, utilizing surface behind BLE for balance    Ambulation / Gait / Stairs / Wheelchair Mobility  Ambulation/Gait Ambulation/Gait assistance: Mod assist, +2 physical assistance, +2 safety/equipment Gait Distance (Feet): 30 Feet Assistive device: Rolling walker (2 wheels) Gait Pattern/deviations: Step-to pattern, Decreased stride length,  Decreased stance time - right, Decreased step length - left, Drifts right/left, Trunk flexed General Gait Details: not attempted today, focused on weight shift in standing to decrease left lateral lean Gait velocity: decr Pre-gait activities: lateral weight shifts trying to find midline, focused on maintaining RW on the ground    Posture / Balance Dynamic Sitting Balance Sitting balance - Comments: sitting EOB Balance Overall balance assessment: Needs assistance Sitting-balance support: Feet supported, Single extremity supported Sitting balance-Leahy Scale: Fair Sitting balance - Comments: sitting EOB Postural control: Posterior lean, Left lateral lean Standing balance support: During functional activity, Reliant on assistive device for balance, Bilateral upper extremity supported Standing balance-Leahy Scale: Poor Standing balance comment: reliant on RW and external support    Special needs/care consideration Special service needs ID/HIV     Previous Home Environment (from acute therapy documentation) Living Arrangements: Spouse/significant other, Children, Other relatives Available Help at Discharge: Family Type of Home: House Home Layout: Multi-level, Laundry or work area in basement, [employer] on main level, Able to live on main level with bedroom/bathroom Home Access: Stairs to enter Entrance Stairs-Rails: None (nephew planning to build rails) Secretary/administrator of Steps: 2 Bathroom Shower/Tub: Engineer, manufacturing systems: Pharmacist, community: Yes Home Care Services: No Additional Comments: PTA and ~ last 6 weeks pt was fully independent and 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 power wc is from his wife when she was recovering from her cancer treatments. he has had to use it in the last ~6 weeks due to weakness.  Discharge Living Setting Plans for Discharge Living Setting: Patient's home, Lives with (comment) (spouse) Type of Home at Discharge:  House Discharge Home Layout: Able to live on main level with bedroom/bathroom Discharge Home Access: Stairs to enter (working on a ramp) Entrance Stairs-Rails: None Secretary/administrator of Steps: 2 Discharge Bathroom Shower/Tub: Community education officer: Standard Discharge Bathroom Accessibility: Yes How Accessible: Accessible via walker Does the patient have any problems obtaining your medications?: No  Social/Family/Support Systems Patient Roles: Spouse Anticipated Caregiver: [name] (Flo) Balser, spouse Anticipated Industrial/product designer Information: [phone] Ability/Limitations of Caregiver: none stated, likely min assist mobility/ADLs Caregiver Availability: 24/7 Discharge Plan Discussed with Primary Caregiver: Yes Is Caregiver In Agreement with Plan?: Yes Does Caregiver/Family have Issues with Lodging/Transportation while Pt is in Rehab?: No   Goals Patient/Family Goal for Rehab: PT/OT supervision/mod I, SLP n/a Expected length of stay: 9-13 days Additional Information: Discharge plan: return home with pt's spouse 24/7 support, resume Biktarvy Pt/Family Agrees to Admission  and willing to participate: Yes Program Orientation Provided & Reviewed with Pt/Caregiver Including Roles  & Responsibilities: Yes  Barriers to Discharge: Medical stability   Decrease burden of Care through IP rehab admission: n/a   Possible need for SNF placement upon discharge:Not anticipated.  Discharge plan: return to pt's home at supervision/mod I level per consult dated 6/6, spouse available 24/7 if needed.    Patient Condition: This patient's condition remains as documented in the consult dated 6/6, in which the Rehabilitation Physician determined and documented that the patient's condition is appropriate for intensive rehabilitative care in an inpatient rehabilitation facility. Will admit to inpatient rehab today.  Preadmission Screen Completed By:  Stephania Fragmin, PT, DPT  07/14/2022 10:08 AM ______________________________________________________________________   Discussed status with Dr. Riley Kill on 07/14/22 at 10:12 AM  and received approval for admission today.  Admission Coordinator:  Stephania Fragmin, PT, DPT time 10:12 AM Dorna Bloom 07/14/22

## 2022-07-14 NOTE — Progress Notes (Signed)
Patient transferred to room 4 west room 24.

## 2022-07-14 NOTE — Consult Note (Signed)
History and Physical    Rollie Cillo NWG:[id number] DOB: May 07, 1960 DOA: 07/14/2022  PCP: [name], [name]  Patient coming from: Home  I have personally briefly reviewed patient's old medical records in [facility] Link  Chief Complaint: Fever  HPI: Nohlan Bauguess is a 62 y.o. 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  Shortly after transfer to inpatient rehab floor patient was noted to have pursed lip breathing with shallow inspiration.  Vitals were obtained and showed fever 101.4 F, pulse 117, RR 23.  He was placed on 2 L O2 for SpO2 90% which was titrated briefly to 6 L and back down to 3 L with SpO2 maintaining 92%.  Rapid response were called.  Portable chest x-ray was obtained and showed low lung volumes with bibasilar atelectasis.  No pneumothorax or pleural effusion seen.  No focal consolidation.  Labs including CBC, CMP, lactic acid, blood cultures, COVID PCR, urinalysis were ordered and pending.  Patient seen at bedside.  He reports overall feeling well.  He has had a nonproductive cough for the last 3 days.  He reports some difficulty with taking deep breaths as well as pleuritic chest discomfort.  He says his left-sided weakness has improved significantly compared to when he first came into the hospital.  He denies any nausea, vomiting, aspiration event, abdominal pain, dysuria, diarrhea, swelling of his legs.  Review of Systems: All systems reviewed and are negative except as documented in history of present illness above.   Past Medical History:  Diagnosis Date   Atrial fibrillation (HCC)    In the setting of  hyperthyroidism   Bilateral pulmonary embolism [facility])    Diagnosed August 2021   Diabetes mellitus without complication (HCC)    Essential hypertension    HIV antibody positive (HCC)    Perforated duodenal ulcer (HCC) 2015   Contained without need for surgical intervention   Pneumonia due to COVID-19 virus    Diagnosed August 2021    Past Surgical History:  Procedure Laterality Date   COLONOSCOPY  Sept 2015   [place]: transverse colon polyp with pathology reporting lymphoid nodule    ESOPHAGOGASTRODUODENOSCOPY N/A 03/30/2014   Procedure: ESOPHAGOGASTRODUODENOSCOPY (EGD);  Surgeon: Corbin Ade, MD;  Location: [facility] ENDO SUITE;  Service: Endoscopy;  Laterality: N/A;  215   UMBILICAL HERNIA REPAIR N/A 12/16/2021   Procedure: HERNIA REPAIR UMBILICAL ADULT WITH MESH;  Surgeon: Franky Macho, MD;  Location: [facility] ORS;  Service: General;  Laterality: N/A;    Social History:  reports that he has never smoked. He has never used smokeless tobacco. He reports that he does not drink alcohol and does not use drugs.  Allergies  Allergen Reactions   Asa [Aspirin] Other (See Comments)    Epistaxis    Family History  Problem Relation Age of Onset   Ulcers Mother    Heart Problems Mother    Colon cancer Neg Hx      Prior to Admission medications   Medication Sig Start Date End Date Taking? Authorizing Provider  albuterol (PROVENTIL) (2.5 MG/3ML) 0.083% nebulizer solution Take 2.5 mg by nebulization every 4 (four) hours. 12/01/21   [provider]  albuterol (VENTOLIN HFA) 108 (90 Base) MCG/ACT inhaler 1 puff every 4 (four) hours as needed for wheezing or shortness of breath. 10/23/19   [provider]  ascorbic acid (VITAMIN C) 500 MG tablet Take 1 tablet (500 mg total) by mouth daily. 04/10/22   Vassie Loll, MD  bictegravir-emtricitabine-tenofovir AF (BIKTARVY) 50-200-25 MG TABS tablet Take 1 tablet by mouth daily. 07/13/22   Randall Hiss, MD  cyanocobalamin 1000  MCG tablet Take 1 tablet (1,000 mcg total) by mouth daily. 07/14/22   Leroy Sea, MD  oxyCODONE-acetaminophen (PERCOCET) 10-325 MG tablet Take 1 tablet by mouth every 6 (six) hours. 06/09/22   [provider]  rosuvastatin (CRESTOR) 10 MG tablet Take 1 tablet (10 mg total) by mouth daily. 07/14/22   Leroy Sea, MD  sulfamethoxazole-trimethoprim (BACTRIM DS) 800-160 MG tablet Take 1 tablet by mouth daily. 07/13/22   Randall Hiss, MD  valACYclovir (VALTREX) 1000 MG tablet Take 1 tablet (1,000 mg total) by mouth 3 (three) times daily for 9 days. 07/13/22 07/22/22  Randall Hiss, MD    Physical Exam: Vitals:   07/14/22 1916 07/14/22 1921 07/14/22 1929 07/14/22 2118  BP:  (!) 146/95  135/88  Pulse:  (!) 117  (!) 106  Resp:  (!) 23  (!) 28  Temp:  (!) 101.4 F (38.6 C)  98.9 F (37.2 C)  TempSrc:  Oral  Oral  SpO2:  90%  92%  Weight:   95.3 kg   Height: 5\' 11"  (1.803 m)      Constitutional: Resting in bed, NAD, calm, comfortable Eyes: EOMI, lids and conjunctivae normal ENMT: Mucous membranes are moist. Posterior pharynx clear of any exudate or lesions.Normal dentition.  Neck: normal, supple, no masses. Respiratory: Decreased breath sounds bilateral lung bases, shallow breaths when asked to take deep inspiration.  No wheezing, rhonchi, or crackles. Cardiovascular: Regular rate and rhythm, no murmurs / rubs / gallops. No extremity edema. 2+ pedal pulses. Abdomen: no tenderness, no masses palpated.  Musculoskeletal: no clubbing / cyanosis. No joint deformity upper and lower extremities. Good ROM, no contractures. Normal muscle tone.  Skin: Diaphoretic.  No rashes, lesions, ulcers. No induration Neurologic: CN 2-12 grossly intact. Sensation intact. Strength 5/5 RUE and RLE, 4/5 [name] and LLE. Psychiatric: Alert and oriented x 3. Normal mood.   EKG: Not performed.  Assessment and Plan: Fever: Patient noted to have fever on transfer to inpatient rehab floor.  He had  associated tachycardia and tachypnea.  CXR shows shallow lung fields with bibasilar atelectasis.  No leukocytosis on repeat CBC.  He has history of PE, not on chronic anticoagulation.  He did receive heparin VTE prophylaxis during admission.  Fever could be due to atelectasis, IRIS now that he is back on ART, pneumonia not seen on CXR, or potentially PE. -Obtain CTA chest -Follow-up COVID, UA, blood cultures -Lactic acid is 0.9, CBC and CMP stable -Continue ART; if IRIS treatment is supportive care -Recommend use of incentive spirometer, flutter valve -TRH will follow  Left-sided weakness suspected secondary to PML: Strength significantly improved compared to initial presentation, 4/5 [name] and LLE on exam today.  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  He was treated with IV acyclovir and now transitioned to oral Valtrex through 6/15.  HIV: Recent CD4 54 with RNA 320,000.  He had not been on ART for several years until started on Biktarvy this recent admission by ID.  Continue Biktarvy.  As above his fever  and vital sign changes may be due to IRIS.  Bilateral parotid gland mass: S/p CT-guided biopsy of both parotid glands by IR on 07/12/2022.  FNAC negative for malignant cells.  Asthma: No appreciable wheezing on exam.  Will order prn albuterol nebulizers.   DVT prophylaxis: heparin injection 5,000 Units Start: 07/14/22 2200 Place TED hose Start: 07/14/22 1853  Darreld Mclean MD [facility]  If 7PM-7AM, please contact night-coverage [email or web address]  07/14/2022, 10:20 PM

## 2022-07-14 NOTE — Progress Notes (Signed)
Inpatient Rehab Admissions Coordinator:    I have insurance approval and a bed available for pt to admit to CIR today. Dr. Thedore Mins in agreement.  Will let pt/family and TOC team know.   Estill Dooms, PT, DPT Admissions Coordinator [phone] 07/14/22  10:04 AM

## 2022-07-14 NOTE — Progress Notes (Signed)
Inpatient Rehab Admissions Coordinator:    Pt. Is now agreeable to CIR and will admit today. RN may call report to [phone]  Megan Salon, MS, CCC-SLP Rehab Admissions Coordinator  [phone] (celll) [phone] (office)

## 2022-07-14 NOTE — H&P (Signed)
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  Chief complaints were allover soreness and weakness.  Workup was significant for possible urinary tract infection and hypokalemia.  He was placed on antibiotics and given potassium supplement and discharged home.  He re-presented to the emergency department at [facility] on 07/07/2022 complaining of an approximately 6-week history of left-sided weakness.  Notes indicate the patient was sent there for MRI scanning.  Neurology was consulted and MRI of the brain revealed abnormal T2 signal of the cortical subcortical and right greater than left insular postcentral gyrus as well as splenium and body of corpus callosum and thalamus and midbrain.  This was concerning for infectious versus inflammatory process such as PML, autoimmune encephalitis, herpes simplex and enteritis.  Recommendations were for high-volume LP with labs, MRI of the brain with contrast and admission to [facility].  The patient's pertinent past medical history includes HIV, hypertension and diabetes mellitus.  The patient's history of HIV disease is longstanding and he has not been on ART for several years going back to 2017.  His most recent regimen was Descovy, darnunavir/ritonavir and etravirine.  Prior history of bilateral pulmonary embolism 2021, atrial fibrillation in the setting of hyperthyroidism.  The patient is not chronically anticoagulated.   Infectious disease consultation obtained by Dr. Earlene Plater on 6/01.  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  Given high-dose  B12 daily. Imaging was also significant for bilateral parotid masses and ENT consultation placed for biopsy.  Ultrasound-guided aspiration of bilateral parotid cyst on 6/03 by Dr. Grace Isaac.  Negative for malignancy. Prior to approximately 6 weeks ago patient was independent without a device and working.  Over the last 6 weeks he required increasing assistance from his spouse for mobility and basic self-care activities.  He will discharge home with his spouse to a multilevel home with 2 steps to enter.  He can stay on the first level which has a bedroom and bathroom. 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     Review of Systems  Constitutional:  Positive for malaise/fatigue. Negative for fever.  HENT:  Negative for hearing loss and tinnitus.   Eyes: Negative.   Respiratory:  Negative for cough.   Cardiovascular:  Negative for chest pain.  Gastrointestinal:  Negative for heartburn, nausea and vomiting.  Genitourinary:  Negative for dysuria.  Musculoskeletal:  Positive for back pain, falls and myalgias.  Skin:  Negative for rash.  Neurological:  Positive for sensory change, focal weakness and weakness. Negative for dizziness.  Psychiatric/Behavioral:  The patient is nervous/anxious.         Past Medical History:  Diagnosis Date   Atrial fibrillation [facility])      In the setting of hyperthyroidism   Bilateral pulmonary embolism [facility])      Diagnosed August 2021   Diabetes mellitus without complication (HCC)     Essential hypertension     HIV antibody positive (HCC)     Perforated duodenal  ulcer (HCC) 2015    Contained without need for surgical intervention   Pneumonia due to COVID-19 virus      Diagnosed August 2021         Past Surgical History:  Procedure Laterality Date   COLONOSCOPY   Sept 2015    [place]: transverse colon polyp with pathology reporting  lymphoid nodule    ESOPHAGOGASTRODUODENOSCOPY N/A 03/30/2014    Procedure: ESOPHAGOGASTRODUODENOSCOPY (EGD);  Surgeon: Corbin Ade, MD;  Location: [facility] ENDO SUITE;  Service: Endoscopy;  Laterality: N/A;  215   UMBILICAL HERNIA REPAIR N/A 12/16/2021    Procedure: HERNIA REPAIR UMBILICAL ADULT WITH MESH;  Surgeon: Franky Macho, MD;  Location: [facility] ORS;  Service: General;  Laterality: N/A;         Family History  Problem Relation Age of Onset   Ulcers Mother     Heart Problems Mother     Colon cancer Neg Hx      Social History:  reports that he has never smoked. He has never used smokeless tobacco. He reports that he does not drink alcohol and does not use drugs. Allergies:       Allergies  Allergen Reactions   Asa [Aspirin] Other (See Comments)      Nose bleeds          Medications Prior to Admission  Medication Sig Dispense Refill   albuterol (PROVENTIL) (2.5 MG/3ML) 0.083% nebulizer solution Take 2.5 mg by nebulization every 4 (four) hours.       albuterol (VENTOLIN HFA) 108 (90 Base) MCG/ACT inhaler 1 puff every 4 (four) hours as needed for wheezing or shortness of breath.       ascorbic acid (VITAMIN C) 500 MG tablet Take 1 tablet (500 mg total) by mouth daily. 30 tablet 1   cephALEXin (KEFLEX) 500 MG capsule Take 1 capsule (500 mg total) by mouth 4 (four) times daily. 28 capsule 0   oxyCODONE-acetaminophen (PERCOCET) 10-325 MG tablet Take 1 tablet by mouth every 6 (six) hours.              Home: Home Living Family/patient expects to be discharged to:: Private residence Living Arrangements: Spouse/significant other, Children, Other relatives Available Help at Discharge: Family Type of Home: House Home Access: Stairs to enter Secretary/administrator of Steps: 2 Entrance Stairs-Rails: None (nephew planning to build rails) Home Layout: Multi-level, Laundry or work area in basement, [employer] on main level, Able to live on main level with bedroom/bathroom Bathroom Shower/Tub:  Engineer, manufacturing systems: Standard Bathroom Accessibility: Yes Home Equipment: Wheelchair - power, Medical laboratory scientific officer - single point Additional Comments: PTA and ~ last 6 weeks pt was fully independent and 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 power wc is from his wife when she was recovering from her cancer treatments. he has had to use it in the last ~6 weeks due to weakness.   Functional History: Prior Function Prior Level of Function : Independent/Modified Independent, Needs assist, Driving, Working/employed, History of Falls (last six months) Physical Assist : Mobility (physical), ADLs (physical) Mobility (physical): Transfers, Gait ADLs (physical): Bathing, Dressing, Toileting Mobility Comments: prior to ~6 weeks ago pt was independent with no device and working full time driving/operating large equipment. Over last 6 weeks pt has had worsening weakness in Lt side and decreased sensation, pt reports at least 8 falls. He has a SPC he has been using for some ambulation in home and power WC. pt reports he has no awareness Richarda Overlie of Lt foot  location and indicates he may have caught his foot under the chair. ADLs Comments: prior to ~6 weeks ago pt was independent with no AE required. Over last 6 weeks pt has required assits for transfers on/off toilet from spouse, is no longer showering due to fall in bathtub and is having assist from spouse for sink/sponge baths.   Functional Status:  Mobility: Bed Mobility Overal bed mobility: Needs Assistance Bed Mobility: Supine to Sit Supine to sit: Min assist, HOB elevated Sit to supine: Max assist General bed mobility comments: HOB elevated and use of bed rail, minA for trunk support. Increased time to complete, with mild cues to fully scoot to the EOB Transfers Overall transfer level: Needs assistance Equipment used: Rolling walker (2 wheels), 1 person hand held assist Transfers: Sit to/from Stand, Bed to chair/wheelchair/BSC Sit to Stand: Mod assist,  Min assist, From elevated surface Bed to/from chair/wheelchair/BSC transfer type:: Step pivot Stand pivot transfers: Max assist, +2 physical assistance Step pivot transfers: Mod assist, Max assist, From elevated surface General transfer comment: minA to stand from elevated bed, with HHA, standing from chair x5 reps and BSC x1 rep, ranging from min-modA for power up and steady with trials. Performing step pivot transfer to chair, BSC, and back to chair with mod to maxA. modA when transferring to the L with maxA needed for R transfer for balance and sequencing. Cued for proper hand placement and technique with each transfer. Pt continues with left lateral lean, utilizing surface behind BLE for balance Ambulation/Gait Ambulation/Gait assistance: Mod assist, +2 physical assistance, +2 safety/equipment Gait Distance (Feet): 30 Feet Assistive device: Rolling walker (2 wheels) Gait Pattern/deviations: Step-to pattern, Decreased stride length, Decreased stance time - right, Decreased step length - left, Drifts right/left, Trunk flexed General Gait Details: not attempted today, focused on weight shift in standing to decrease left lateral lean Gait velocity: decr Pre-gait activities: lateral weight shifts trying to find midline, focused on maintaining RW on the ground   ADL: ADL Overall ADL's : Needs assistance/impaired Eating/Feeding: Set up, Sitting Grooming: Wash/dry face, Sitting, Supervision/safety Grooming Details (indicate cue type and reason): sitting EOB with unilateral UE support Upper Body Bathing: Set up, Sitting Lower Body Bathing: Moderate assistance, +2 for physical assistance, +2 for safety/equipment, Sit to/from stand Upper Body Dressing : Min guard, Sitting Lower Body Dressing: Maximal assistance, +2 for physical assistance, +2 for safety/equipment, Sit to/from stand Toilet Transfer: Moderate assistance, +2 for safety/equipment, +2 for physical assistance, Stand-pivot,  BSC/3in1 Toilet Transfer Details (indicate cue type and reason): transfer from [facility] towards R Toileting- Clothing Manipulation and Hygiene: Total assistance, +2 for physical assistance, +2 for safety/equipment, Sit to/from stand Toileting - Clothing Manipulation Details (indicate cue type and reason): total A for pericare post BM, required BUE support on RW for stability during task Functional mobility during ADLs: Moderate assistance, +2 for physical assistance, +2 for safety/equipment, Rolling walker (2 wheels) General ADL Comments: limited by L hemiplegia and decreased activity tolerance   Cognition: Cognition Overall Cognitive Status: Within Functional Limits for tasks assessed Orientation Level: Oriented X4 Cognition Arousal/Alertness: Awake/alert Behavior During Therapy: Flat affect Overall Cognitive Status: Within Functional Limits for tasks assessed General Comments: pt pleasant throughout session, HOH but following commands once he hears   Physical Exam: Blood pressure 123/79, pulse 87, temperature 99.6 F (37.6 C), temperature source Oral, resp. rate (!) 21, height 5\' 11"  (1.803 m), weight 104.3 kg, SpO2 96 %. Physical Exam Constitutional:      Appearance: He is obese. He  is not ill-appearing.  HENT:     Head: Normocephalic and atraumatic.     Right Ear: External ear normal.     Left Ear: External ear normal.     Nose: Nose normal.     Mouth/Throat:     Mouth: Mucous membranes are moist.     Pharynx: Oropharynx is clear.  Eyes:     Extraocular Movements: Extraocular movements intact.     Pupils: Pupils are equal, round, and reactive to light.  Cardiovascular:     Rate and Rhythm: Normal rate and regular rhythm.     Heart sounds: No murmur heard. Pulmonary:     Effort: Pulmonary effort is normal. No respiratory distress.     Breath sounds: Normal breath sounds. No wheezing.  Abdominal:     General: Bowel sounds are normal. There is no distension.     Palpations:  Abdomen is soft.     Tenderness: There is no abdominal tenderness.  Musculoskeletal:     Cervical back: Normal range of motion.     Right lower leg: Edema present.     Left lower leg: Edema present.  Skin:    General: Skin is warm and dry.  Neurological:     Mental Status: He is alert.     Comments: Alert and oriented x 3. Fair and at times limited insight and awareness. Intact Memory. Normal language and speech. Cranial nerve exam unremarkable. MMT: 5/5 UE prox to distal. RLE 4/5 HF, 4+ KE and 4+ to 5/5 ADF/PF. LLE 3+/5 HF, 4-/5 KE and 4/5 ADF/PF. Decreased LT/proprioception below knees in stocking glove distribution, is able to discern gross touch.  No deficits in hands. DTR's 1+.   Psychiatric:     Comments: Pt somewhat anxious and distractible at times. Able to stay calm with reassuring words and conversation.         Lab Results Last 48 Hours        Results for orders placed or performed during the hospital encounter of 07/07/22 (from the past 48 hour(s))  CBC with Differential/Platelet     Status: Abnormal    Collection Time: 07/13/22  7:09 AM  Result Value Ref Range    WBC 3.9 (L) 4.0 - 10.5 K/uL    RBC 4.27 4.22 - 5.81 MIL/uL    Hemoglobin 12.4 (L) 13.0 - 17.0 g/dL    HCT 16.1 (L) 09.6 - 52.0 %    MCV 87.1 80.0 - 100.0 fL    MCH 29.0 26.0 - 34.0 pg    MCHC 33.3 30.0 - 36.0 g/dL    RDW 04.5 40.9 - 81.1 %    Platelets 106 (L) 150 - 400 K/uL      Comment: REPEATED TO VERIFY    nRBC 0.0 0.0 - 0.2 %    Neutrophils Relative % 53 %    Neutro Abs 2.1 1.7 - 7.7 K/uL    Lymphocytes Relative 33 %    Lymphs Abs 1.3 0.7 - 4.0 K/uL    Monocytes Relative 8 %    Monocytes Absolute 0.3 0.1 - 1.0 K/uL    Eosinophils Relative 5 %    Eosinophils Absolute 0.2 0.0 - 0.5 K/uL    Basophils Relative 1 %    Basophils Absolute 0.0 0.0 - 0.1 K/uL    Immature Granulocytes 0 %    Abs Immature Granulocytes 0.01 0.00 - 0.07 K/uL      Comment: Performed at [facility], [address], [place],  [place] [address]  Brain natriuretic peptide     Status: None    Collection Time: 07/13/22  7:09 AM  Result Value Ref Range    B Natriuretic Peptide 57.0 0.0 - 100.0 pg/mL      Comment: Performed at [facility], [address], [place], [place] [address]  Basic metabolic panel     Status: Abnormal    Collection Time: 07/13/22  7:09 AM  Result Value Ref Range    Sodium 137 135 - 145 mmol/L    Potassium 3.7 3.5 - 5.1 mmol/L    Chloride 104 98 - 111 mmol/L    CO2 24 22 - 32 mmol/L    Glucose, Bld 99 70 - 99 mg/dL      Comment: Glucose reference range applies only to samples taken after fasting for at least 8 hours.    BUN 7 6 - 20 mg/dL    Creatinine, Ser 8.29 0.61 - 1.24 mg/dL    Calcium 8.5 (L) 8.9 - 10.3 mg/dL    GFR, Estimated >56 >21 mL/min      Comment: (NOTE) Calculated using the CKD-EPI Creatinine Equation (2021)      Anion gap 9 5 - 15      Comment: Performed at [facility], [address], [place], [place] [address]  Magnesium     Status: None    Collection Time: 07/13/22  7:09 AM  Result Value Ref Range    Magnesium 2.1 1.7 - 2.4 mg/dL      Comment: Performed at [facility], [address] [place], [place] [address]  Glucose, capillary     Status: Abnormal    Collection Time: 07/13/22  1:44 PM  Result Value Ref Range    Glucose-Capillary 115 (H) 70 - 99 mg/dL      Comment: Glucose reference range applies only to samples taken after fasting for at least 8 hours.  CBC with Differential/Platelet     Status: Abnormal    Collection Time: 07/14/22  6:22 AM  Result Value Ref Range    WBC 4.1 4.0 - 10.5 K/uL    RBC 4.20 (L) 4.22 - 5.81 MIL/uL    Hemoglobin 12.3 (L) 13.0 - 17.0 g/dL    HCT 62.9 (L) 52.8 - 52.0 %    MCV 88.1 80.0 - 100.0 fL    MCH 29.3 26.0 - 34.0 pg    MCHC 33.2 30.0 - 36.0 g/dL    RDW 41.3 24.4 - 01.0 %    Platelets 110 (L) 150 - 400 K/uL      Comment: REPEATED TO VERIFY    nRBC 0.0 0.0 - 0.2 %    Neutrophils Relative % 64  %    Neutro Abs 2.6 1.7 - 7.7 K/uL    Lymphocytes Relative 26 %    Lymphs Abs 1.1 0.7 - 4.0 K/uL    Monocytes Relative 7 %    Monocytes Absolute 0.3 0.1 - 1.0 K/uL    Eosinophils Relative 2 %    Eosinophils Absolute 0.1 0.0 - 0.5 K/uL    Basophils Relative 1 %    Basophils Absolute 0.0 0.0 - 0.1 K/uL    Immature Granulocytes 0 %    Abs Immature Granulocytes 0.01 0.00 - 0.07 K/uL      Comment: Performed at [facility], [address], [place], [place] [address]  Brain natriuretic peptide     Status: None    Collection Time: 07/14/22  6:22 AM  Result Value Ref Range    B Natriuretic Peptide 49.1 0.0 - 100.0 pg/mL      Comment: Performed at [facility], [address] [place], [place] [address]  Basic metabolic panel     Status: Abnormal    Collection Time: 07/14/22  6:22 AM  Result Value Ref Range    Sodium 135 135 - 145 mmol/L    Potassium 3.5 3.5 - 5.1 mmol/L    Chloride 107 98 - 111 mmol/L    CO2 20 (L) 22 - 32 mmol/L    Glucose, Bld 106 (H) 70 - 99 mg/dL      Comment: Glucose reference range applies only to samples taken after fasting for at least 8 hours.    BUN 8 6 - 20 mg/dL    Creatinine, Ser 1.91 0.61 - 1.24 mg/dL    Calcium 8.6 (L) 8.9 - 10.3 mg/dL    GFR, Estimated >47 >82 mL/min      Comment: (NOTE) Calculated using the CKD-EPI Creatinine Equation (2021)      Anion gap 8 5 - 15      Comment: Performed at [facility], [address] [place], [place] [address]  Magnesium     Status: None    Collection Time: 07/14/22  6:22 AM  Result Value Ref Range    Magnesium 2.1 1.7 - 2.4 mg/dL      Comment: Performed at [facility], [address], [place], [place] [address]       Imaging Results (Last 48 hours)  EEG adult   Result Date: 07/13/2022 Charlsie Quest, MD     07/13/2022 10:05 PM Patient Name: Benecio Blood MRN: [id number] Epilepsy Attending: Charlsie Quest Referring Physician/Provider: Leroy Sea, MD Date: 07/13/2022 Duration:  23.28 mins Patient history:  62 year old male with a 6-week history of increasing left-sided weakness and hemisensory deficits getting eeg to evaluate for seizure. Level of alertness: Awake, asleep AEDs during EEG study: None Technical aspects: This EEG study was done with scalp electrodes positioned according to the 10-20 International system of electrode placement. Electrical activity was reviewed with band pass filter of 1-70Hz , sensitivity of 7 uV/mm, display speed of 35mm/sec with a 60Hz  notched filter applied as appropriate. EEG data were recorded continuously and digitally stored.  Video monitoring was available and reviewed as appropriate. Description: The posterior dominant rhythm consists of 9 Hz activity of moderate voltage (25-35 uV) seen predominantly in posterior head regions, symmetric and reactive to eye opening and eye closing. Sleep was characterized by vertex waves, sleep spindles (12 to 14 Hz), maximal frontocentral region. Physiologic photic driving was seen during photic stimulation.  Hyperventilation was not performed.   IMPRESSION: This study is within normal limits. No seizures or epileptiform discharges were seen throughout the recording. A normal interictal EEG does not exclude the diagnosis of epilepsy. Priyanka O Yadav           Blood pressure 123/79, pulse 87, temperature 99.6 F (37.6 C), temperature source Oral, resp. rate (!) 21, height 5\' 11"  (1.803 m), weight 104.3 kg, SpO2 96 %.   Medical Problem List and Plan: 1. 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 Pt with subsequent left greater than right weakness and sensory loss particularly involving the lower extremities.              -patient may shower             -  ELOS/Goals: 12-15 days, supervision goals with PT, OT. SLP can be held in the short term             -pt wants to go home but does have awareness that he and his wife  cannot manage on their own while he's at this current functional state. Wants to participate in rehab.    2.  Antithrombotics: -DVT/anticoagulation:  Pharmaceutical: Heparin             -antiplatelet therapy: none   3. Pain Management: Tylenol as needed   4. Mood/Behavior/Sleep: LCSW to evaluate and provide emotional support             -antipsychotic agents: n/a             -pt will need regular reassurance of team/family.              -will add xanax 0.5mg  q12 prn for anxiety 5. Neuropsych/cognition: This patient is capable of making decisions on his own behalf.   6. Skin/Wound Care: Routine skin care checks   7. Fluids/Electrolytes/Nutrition: Routine Is and Os and follow-up chemistries             -continue B12   8: Hyperlipidemia: continue statin    9: HIV/AIDS: continue Biktarvy and Bactrim DS per ID recs             -pt seems to understand the importance of remaining on his med regimen 10: Mild, chronic anemia: stable; follow-up CBC   11: History of asthma: no exacerbation   12: HSV-2: continue acyclovir TID for 9 days              -unlikely contributing to his MRI or clinical findings   13: PML: continue ART treatment, Bactrim   14: History of a. fib: no new onset or AC   15: DM-2; A1c = 6.6% on 04/10/2022; (no home meds listed)             -serum glucose range: 89-119         Milinda Antis, [name] 07/14/2022  I have personally performed a face to face diagnostic evaluation of this patient and formulated the key components of the plan.  Additionally, I have personally reviewed laboratory data, imaging studies, as well as relevant notes and concur with the physician assistant's documentation above.  The patient's status has not changed from the original H&P.  Any changes in documentation from the acute care chart have been noted above.  Ranelle Oyster, MD, [place]

## 2022-07-14 NOTE — Progress Notes (Addendum)
With day charge nurse went to perform skin assessment with patient. Does endorse mid back pain radiating from his neck. During initial interaction difficulty opening his mouth. Pursed lip breathing. Lips asymmetrical. Pin point pupils non reactive. Shallow and rapid breathing. Diminished respirations lower airway. Placed on 2 Liters Nasal Cannula. Increased to 6 Liters SPO2 able to go back up to 96%. Titrated down to 3 Liters. Charge RN stated update on call Provider about order for 4 Liters Nasal Cannula. Weak grip left arm and unable to lift left arm up at this time/movement with gravity eliminated. Able to push both upper extremities with resistance. Weak movement with left lower extremity. Difficulty with dorsiflexion of left foot.  Once O2 saturation improvement lips symmetrical and pupils reactive. Rapid Response Nurse called and assisting with situation at this time. Left side continues to be weak with limited movement.  Skin is diaphoretic and hot to touch.

## 2022-07-14 NOTE — H&P (Signed)
History and Physical    Raiyan Dorry ZOX:[id number] DOB: 06/05/1960 DOA: 07/15/2022  PCP: [name], [name]  Patient coming from: Home  I have personally briefly reviewed patient's old medical records in [facility]  Chief Complaint: Fever  HPI: Jose Lamb is a 62 y.o. 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  Shortly after transfer to inpatient rehab floor patient was noted to have pursed lip breathing with shallow inspiration.  Vitals were obtained and showed fever 101.4 F, pulse 117, RR 23.  He was placed on 2 L O2 for SpO2 90% which was titrated briefly to 6 L and back down to 3 L with SpO2 maintaining 92%.  Rapid response were called.  Portable chest x-ray was obtained and showed low lung volumes with bibasilar atelectasis.  No pneumothorax or pleural effusion seen.  No focal consolidation.  Labs including CBC, CMP, lactic acid, blood cultures, COVID PCR, urinalysis were ordered and pending.  Patient seen at bedside.  He reports overall feeling well.  He has had a nonproductive cough for the last 3 days.  He reports some difficulty with taking deep breaths as well as pleuritic chest discomfort.  He says his left-sided weakness has improved significantly compared to when he first came into the hospital.  He denies any nausea, vomiting, aspiration event, abdominal pain, dysuria, diarrhea, swelling of his legs.  Review of Systems: All systems reviewed and are negative except as documented in history of present illness above.   Past Medical History:  Diagnosis Date   Atrial fibrillation (HCC)    In the setting of  hyperthyroidism   Bilateral pulmonary embolism [facility])    Diagnosed August 2021   Diabetes mellitus without complication (HCC)    Essential hypertension    HIV antibody positive (HCC)    Perforated duodenal ulcer (HCC) 2015   Contained without need for surgical intervention   Pneumonia due to COVID-19 virus    Diagnosed August 2021    Past Surgical History:  Procedure Laterality Date   COLONOSCOPY  Sept 2015   [place]: transverse colon polyp with pathology reporting lymphoid nodule    ESOPHAGOGASTRODUODENOSCOPY N/A 03/30/2014   Procedure: ESOPHAGOGASTRODUODENOSCOPY (EGD);  Surgeon: Corbin Ade, MD;  Location: [facility] ENDO SUITE;  Service: Endoscopy;  Laterality: N/A;  215   UMBILICAL HERNIA REPAIR N/A 12/16/2021   Procedure: HERNIA REPAIR UMBILICAL ADULT WITH MESH;  Surgeon: Franky Macho, MD;  Location: [facility] ORS;  Service: General;  Laterality: N/A;    Social History:  reports that he has never smoked. He has never used smokeless tobacco. He reports that he does not drink alcohol and does not use drugs.  Allergies  Allergen Reactions   Asa [Aspirin] Other (See Comments)    Epistaxis    Family History  Problem Relation Age of Onset   Ulcers Mother    Heart Problems Mother    Colon cancer Neg Hx      Prior to Admission medications   Medication Sig Start Date End Date Taking? Authorizing Provider  albuterol (PROVENTIL) (2.5 MG/3ML) 0.083% nebulizer solution Take 2.5 mg by nebulization every 4 (four) hours. 12/01/21   [provider]  albuterol (VENTOLIN HFA) 108 (90 Base) MCG/ACT inhaler 1 puff every 4 (four) hours as needed for wheezing or shortness of breath. 10/23/19   [provider]  ascorbic acid (VITAMIN C) 500 MG tablet Take 1 tablet (500 mg total) by mouth daily. 04/10/22   Vassie Loll, MD  bictegravir-emtricitabine-tenofovir AF (BIKTARVY) 50-200-25 MG TABS tablet Take 1 tablet by mouth daily. 07/13/22   Randall Hiss, MD  cyanocobalamin 1000  MCG tablet Take 1 tablet (1,000 mcg total) by mouth daily. 07/14/22   Leroy Sea, MD  oxyCODONE-acetaminophen (PERCOCET) 10-325 MG tablet Take 1 tablet by mouth every 6 (six) hours. 06/09/22   [provider]  rosuvastatin (CRESTOR) 10 MG tablet Take 1 tablet (10 mg total) by mouth daily. 07/14/22   Leroy Sea, MD  sulfamethoxazole-trimethoprim (BACTRIM DS) 800-160 MG tablet Take 1 tablet by mouth daily. 07/13/22   Randall Hiss, MD  valACYclovir (VALTREX) 1000 MG tablet Take 1 tablet (1,000 mg total) by mouth 3 (three) times daily for 9 days. 07/13/22 07/22/22  Randall Hiss, MD    Physical Exam: There were no vitals filed for this visit.  Constitutional: Resting in bed, NAD, calm, comfortable Eyes: EOMI, lids and conjunctivae normal ENMT: Mucous membranes are moist. Posterior pharynx clear of any exudate or lesions.Normal dentition.  Neck: normal, supple, no masses. Respiratory: Decreased breath sounds bilateral lung bases, shallow breaths when asked to take deep inspiration.  No wheezing, rhonchi, or crackles. Cardiovascular: Regular rate and rhythm, no murmurs / rubs / gallops. No extremity edema. 2+ pedal pulses. Abdomen: no tenderness, no masses palpated.  Musculoskeletal: no clubbing / cyanosis. No joint deformity upper and lower extremities. Good ROM, no contractures. Normal muscle tone.  Skin: Diaphoretic.  No rashes, lesions, ulcers. No induration Neurologic: CN 2-12 grossly intact. Sensation intact. Strength 5/5 RUE and RLE, 4/5 [name] and LLE. Psychiatric: Alert and oriented x 3. Normal mood.   EKG: Not performed.  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  TRH consulted.  CTA chest obtained and shows extensive bilateral PE with moderate to large thrombus volume and CT evidence of right heart strain. 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  He was treated with IV acyclovir and now transitioned to oral Valtrex through 6/15.  HIV: Recent CD4 54 with RNA 320,000.  He had not been on ART for several years until started on Biktarvy this recent admission by ID.  Continue Biktarvy and Bactrim.  Bilateral parotid gland mass: S/p CT-guided biopsy of both parotid glands by IR on 07/12/2022.  FNAC was negative for malignant cells.  Asthma: Continue albuterol as needed.  Hyperlipidemia: Continue rosuvastatin.  Ascending thoracic aortic aneurysm: Seen on CT imaging measuring 4.5 cm.  Semiannual imaging and follow-up with CT surgery recommended.   DVT prophylaxis: IV heparin Code Status: Full code Family Communication: None present Disposition Plan: Pending clinical progress Consults called: None  Severity of Illness: The appropriate patient status for this patient is INPATIENT. Inpatient status is judged to be reasonable and necessary in order to provide the required intensity of service to ensure the patient's safety. The patient's presenting symptoms, physical exam findings, and initial radiographic and laboratory data in the context of their chronic comorbidities is felt to place them at high risk for further clinical deterioration. Furthermore, it is not anticipated that the patient will be medically stable for discharge from the hospital within 2 midnights of admission.   * I certify that at the point of  admission it is my clinical judgment that the patient will require inpatient hospital care spanning beyond 2 midnights from the point of admission due to high intensity of service, high risk for further deterioration and high frequency of surveillance required.Darreld Mclean MD [facility]  If 7PM-7AM, please contact night-coverage [email or web address]  07/15/2022, 12:50 AM

## 2022-07-14 NOTE — Plan of Care (Signed)
CTA chest shows extensive bilateral PE with moderate to large thrombus volume and evidence of right heart strain.  Will transfer patient to progressive bed under hospitalist service.  Start on IV heparin.  Patient placement and PA [address] notified.

## 2022-07-14 NOTE — Discharge Instructions (Signed)
Follow with Primary MD [name], [name] in 7 days   Get CBC, CMP, 2 view Chest X ray -  checked next visit with your primary MD or CIR MD    Activity: As tolerated with Full fall precautions use walker/cane & assistance as needed  Disposition CIR  Diet: Heart Healthy    Special Instructions: If you have smoked or chewed Tobacco  in the last 2 yrs please stop smoking, stop any regular Alcohol  and or any Recreational drug use.  On your next visit with your primary care physician please Get Medicines reviewed and adjusted.  Please request your Prim.MD to go over all Hospital Tests and Procedure/Radiological results at the follow up, please get all Hospital records sent to your Prim MD by signing hospital release before you go home.  If you experience worsening of your admission symptoms, develop shortness of breath, life threatening emergency, suicidal or homicidal thoughts you must seek medical attention immediately by calling 911 or calling your MD immediately  if symptoms less severe.  You Must read complete instructions/literature along with all the possible adverse reactions/side effects for all the Medicines you take and that have been prescribed to you. Take any new Medicines after you have completely understood and accpet all the possible adverse reactions/side effects.

## 2022-07-14 NOTE — Progress Notes (Signed)
Physical Therapy Treatment Patient Details Name: Jose Lamb MRN: [id number] DOB: 01-15-1961 Today's Date: 07/14/2022   History of Present Illness Jose Lamb is a 62 y.o. male who presents with 6 weeks of left-sided weakness and frequent falls. MRI brain revealed concern for inflammatory or infectious process. PMHx: CVA, paroxysmal atrial fibrillation not on oral anticoagulation, asthma, HIV with noncompliance, history of pulmonary embolism.    PT Comments    Pt is continuing to demonstrate deficits in L-sided strength, balance, and coordination along with deficits in awareness, problem-solving, and attention. Attempted to simulate home set-up with a flat bed, no bed rails, and elevated EOB for functional mobility this date. Pt required max cues and extra time for all functional mobility. He required minA to roll, modA to sit up EOB, mod-maxA to transfer to stand, and maxA to perform a step pivot transfer using a RW for support. He tends to lean posteriorly and laterally to the L in sitting and standing, placing him at high risk for falls. He was able to improve his static sitting balance with extensive cues and time but poor success when trying to correct his standing balance. Will continue to follow acutely.     Recommendations for follow up therapy are one component of a multi-disciplinary discharge planning process, led by the attending physician.  Recommendations may be updated based on patient status, additional functional criteria and insurance authorization.  Follow Up Recommendations       Assistance Recommended at Discharge Frequent or constant Supervision/Assistance  Patient can return home with the following Two people to help with walking and/or transfers;A lot of help with bathing/dressing/bathroom;Assistance with cooking/housework;Assist for transportation;Help with stairs or ramp for entrance;Direct supervision/assist for medications management;Direct supervision/assist for  financial management   Equipment Recommendations  Rolling walker (2 wheels);BSC/3in1;Wheelchair (measurements PT);Wheelchair cushion (measurements PT);Hospital bed    Recommendations for Other Services       Precautions / Restrictions Precautions Precautions: Fall Precaution Comments: Lt hemi Restrictions Weight Bearing Restrictions: No     Mobility  Bed Mobility Overal bed mobility: Needs Assistance Bed Mobility: Rolling, Sidelying to Sit Rolling: Min assist Sidelying to sit: Mod assist       General bed mobility comments: Bed flat and rails down to simulate home set-up. Max cues and extra time for all tasks needed. Cued pt to flex R leg to push off bed and rotate hips to L to roll onto L side and then hook L leg with R leg to bring them off the EOB, needing minA to complete. Cued pt to push up on L elbow to ascend trunk but needing modA and R HHA to pull up to sit, x2 posterior LOB needing modA to recover before successfully sitting L EOB.    Transfers Overall transfer level: Needs assistance Equipment used: Rolling walker (2 wheels) Transfers: Sit to/from Stand, Bed to chair/wheelchair/BSC Sit to Stand: Mod assist, From elevated surface, Max assist   Step pivot transfers: Max assist, From elevated surface       General transfer comment: ModA to stand 2x from elevated EOB but maxA to fully extend hips and gain balance when standing from recliner, needing repeated cues for hand placement and placing L foot posteriorly and on ground with each transfer. MaxA to move RW and maintain balance to step pivot to L from elevated EOB to RW, needing extensive assist to ensure his hips safely made the chair surface when sitting down.    Ambulation/Gait Ambulation/Gait assistance: Max assist Gait Distance (Feet): 1  Feet Assistive device: Rolling walker (2 wheels) Gait Pattern/deviations: Step-to pattern, Decreased stride length, Decreased stance time - right, Decreased step length -  left, Drifts right/left, Trunk flexed, Leaning posteriorly, Shuffle Gait velocity: decr Gait velocity interpretation: <1.31 ft/sec, indicative of household ambulator   General Gait Details: Pt taking slow, unsteady, shuffling steps to L from bed to recliner with RW support. MaxA for balance due to posterior and L lateral lean and to manage his RW.   Stairs             Wheelchair Mobility    Modified Rankin (Stroke Patients Only) Modified Rankin (Stroke Patients Only) Pre-Morbid Rankin Score: No symptoms Modified Rankin: Moderately severe disability     Balance Overall balance assessment: Needs assistance Sitting-balance support: Feet supported, Single extremity supported, Bilateral upper extremity supported, No upper extremity supported Sitting balance-Leahy Scale: Fair Sitting balance - Comments: Pt with strong L lateral and posterior lean, needing mod-maxA and extensive cues to flex trunk and lean to R. Propped pt on R elbow which did help a little initially. Ultimately improved his balance by cuing pt to place bil elbows on bil thighs and make sure his head stayed between his legs for midline alignment, but still needing repeated cues to reduce lateral leaning, min guard-minA with moments of modA when leaning would increase in intensity due to fatigue. Postural control: Posterior lean, Left lateral lean Standing balance support: During functional activity, Reliant on assistive device for balance, Bilateral upper extremity supported Standing balance-Leahy Scale: Poor Standing balance comment: reliant on RW and mod-maxA, posterior and L lateral lean noted, extensive cues provided to extend hips, place weight in toes, keep RW on ground, and maintain midline but poor success noted                            Cognition Arousal/Alertness: Awake/alert Behavior During Therapy: Flat affect Overall Cognitive Status: Impaired/Different from baseline Area of Impairment:  Attention, Memory, Safety/judgement, Awareness, Problem solving, Following commands                   Current Attention Level: Selective Memory: Decreased short-term memory Following Commands: Follows one step commands with increased time Safety/Judgement: Decreased awareness of deficits, Decreased awareness of safety Awareness: Emergent Problem Solving: Slow processing, Difficulty sequencing, Requires verbal cues, Requires tactile cues General Comments: HOH impacting command following but pt also seemingly forgetful of repeated cues to correct his balance or placement of hands with transfers. Decreased attention span also with poor awareness of his leaning and LOB along with problem-solving how to fix it. Pt not seemingly fully aware of his deficits and the amount of caregiver burden he would be placing on his wife. Not receptive to discussion about benefits of AIR, repeatedly stating "I just want to go home. I wil just stay in a chair all day". Encouraged pt to speak with his wife to decide on his d/c plan together.        Exercises      General Comments General comments (skin integrity, edema, etc.): extensive discussion had in regards to d/c planning and this PT's recs for AIR but pt and wife need to make final decision      Pertinent Vitals/Pain Pain Assessment Pain Assessment: Faces Faces Pain Scale: No hurt Pain Intervention(s): Monitored during session    Home Living  Prior Function            PT Goals (current goals can now be found in the care plan section) Acute Rehab PT Goals Patient Stated Goal: regain function and independence and go home PT Goal Formulation: With patient Time For Goal Achievement: 07/23/22 Potential to Achieve Goals: Good Progress towards PT goals: Progressing toward goals    Frequency    Min 4X/week      PT Plan Equipment recommendations need to be updated    Co-evaluation               AM-PAC PT "6 Clicks" Mobility   Outcome Measure  Help needed turning from your back to your side while in a flat bed without using bedrails?: A Little Help needed moving from lying on your back to sitting on the side of a flat bed without using bedrails?: A Lot Help needed moving to and from a bed to a chair (including a wheelchair)?: A Lot Help needed standing up from a chair using your arms (e.g., wheelchair or bedside chair)?: A Lot Help needed to walk in hospital room?: Total Help needed climbing 3-5 steps with a railing? : Total 6 Click Score: 11    End of Session Equipment Utilized During Treatment: Gait belt Activity Tolerance: Patient tolerated treatment well Patient left: in chair;with call bell/phone within reach;with chair alarm set Nurse Communication: Mobility status PT Visit Diagnosis: Unsteadiness on feet (R26.81);Other abnormalities of gait and mobility (R26.89);Ataxic gait (R26.0);Muscle weakness (generalized) (M62.81);Hemiplegia and hemiparesis;Other symptoms and signs involving the nervous system (R29.898);Difficulty in walking, not elsewhere classified (R26.2) Hemiplegia - Right/Left: Left Hemiplegia - dominant/non-dominant: Non-dominant Hemiplegia - caused by: Unspecified     Time: 6578-4696 PT Time Calculation (min) (ACUTE ONLY): 34 min  Charges:  $Therapeutic Activity: 23-37 mins                     Raymond Gurney, PT, DPT Acute Rehabilitation Services  Office: [phone]    Jewel Baize 07/14/2022, 2:56 PM

## 2022-07-14 NOTE — Progress Notes (Signed)
Inpatient Rehab Admissions Coordinator:   Met with patient at bedside and he is not currently agreeable to come to CIR.  Reviewed CLOF and that pt's spouse had stated yesterday that she could not manage that level at home.  Pt states "it'll be fine we'll work it out."  I spoke to wife on the phone who again confirms that she cannot manage that level of assist at home.  Therapy worked with patient again and he is still max assist for mobility/transfers, unable to progress to ambulation.  I let him know we do have insurance approval and a bed available.  My colleague, Megan Salon, will follow up.    Estill Dooms, PT, DPT Admissions Coordinator [phone] 07/14/22  1:09 PM

## 2022-07-14 NOTE — Progress Notes (Signed)
Inpatient Rehabilitation Discharge Medication Review by a Pharmacist  A complete drug regimen review was completed for this patient to identify any potential clinically significant medication issues.  High Risk Drug Classes Is patient taking? Indication by Medication  Antipsychotic Yes Compazine - nausea  Anticoagulant Yes Heparin subQ - VTE prophylaxis >> UFH infusion for PE  Antibiotic Yes Bactrim DS - HIV/PML  Opioid No   Antiplatelet No   Hypoglycemics/insulin No   Vasoactive Medication No   Chemotherapy No   Other Yes Biktarvy - HIV B12 - PML Crestor - HLD Valtrex through 07/22/22 - HSV Xanax - anxiety Tylenol - pain Robaxin - muscle spasms Trazodone - sleep Benadryl - itching Miralax, Sorbitol, Fleet - constipation Mylanta - indigestion Robitussin - cough     Type of Medication Issue Identified Description of Issue Recommendation(s)  Drug Interaction(s) (clinically significant)     Duplicate Therapy     Allergy     No Medication Administration End Date     Incorrect Dose     Additional Drug Therapy Needed     Significant med changes from prior encounter (inform family/care partners about these prior to discharge).  Communicate medication changes with patient/family at discharge  Other       Clinically significant medication issues were identified that warrant physician communication and completion of prescribed/recommended actions by midnight of the next day:  No   Pharmacist comments:  Pt admitted to [facility] 6/7 1839, now transferring back to IP 6/7 ~2330; no changes since admission med review except for heparin infusion for acute PE started shortly before tx to IP.   Time spent performing this drug regimen review (minutes): 5   Thank you for allowing pharmacy to be a part of this patient's care.   Vernard Gambles, PharmD, BCPS 07/14/2022 11:30 PM   **Pharmacist phone directory can be found on [email or web address] listed under [facility]**

## 2022-07-14 NOTE — Significant Event (Signed)
Rapid Response Event Note   Reason for Call :  MEWS RED-tachycardia(117), tachypnea(23), with fever(101.4) and need for oxygen-SpO2-90s on admission to 4W with need for 4L [place].  Initial Focused Assessment:  Pt lying in bed with eyes open. His breathing is labored, shallow, tachypneic. He says isn't SOB or having and pain besides his back. Lungs are clear, diminished in the bases. ABD soft/nontender. Skin hot/diaphoretic.   T-101.4, HR-123, BP-146/95, RR-24, SpO2-96% on 6L [place], 94% on 3L [place]  Interventions:  Tylenol-given PTA RRT CBC/CMP/LA/Pct U/A, cx Sput cx BC x 2 PCXR Covid swab, airborne/contact precautions until results back TRH consult Plan of Care:  Give tylenol time to work. Obtain/await lab results. Airborne/contact precautions until deemed not needed. TRH consulted-await arrival/any appropriate orders.   Event Summary:   MD Notified: France Ravens, Rehab PA Call [phone] Arrival Time:1959 End [id number]  Terrilyn Saver, RN

## 2022-07-14 NOTE — Progress Notes (Signed)
Inpatient Rehabilitation Admission Medication Review by a Pharmacist  A complete drug regimen review was completed for this patient to identify any potential clinically significant medication issues.  High Risk Drug Classes Is patient taking? 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  Communicate medication changes with patient/family at discharge  Other       Clinically significant medication issues were identified that warrant physician communication and completion of prescribed/recommended actions by midnight of the next day:  No  Name of provider notified for urgent issues identified:   Provider Method of Notification:    Pharmacist comments:   Time spent performing this drug regimen review (minutes): 30   Thank you for allowing pharmacy to be a part of this patient's care.   Signe Colt, PharmD 07/14/2022 3:14 PM  **Pharmacist phone directory can be found on [email or web address] listed under [facility]**

## 2022-07-14 NOTE — Discharge Summary (Signed)
Jose Lamb UJW:[id number] DOB: [birth year] DOA: 07/07/2022  PCP: [name], [name]  Admit date: 07/07/2022  Discharge date: 07/14/2022  Admitted From: Home   Disposition:  CIR   Recommendations for Outpatient Follow-up:   Follow up with PCP in 1-2 weeks  PCP Please obtain BMP/CBC, 2 view CXR in 1week,  (see Discharge instructions)   PCP Please follow up on the following pending results:     Home Health: None   Equipment/Devices: None  Consultations: Neuro, ID Discharge Condition: Stable     CODE STATUS: Full     Diet Recommendation: Heart Healthy    Diet Order             Diet regular Room service appropriate? Yes; Fluid consistency: Thin  Diet effective now           Diet - low sodium heart healthy                    Chief Complaint  Patient presents with   Weakness     Brief history of present illness from the day of admission and additional interim summary      62 y.o. male with medical history significant for prior CVA, paroxysmal atrial fibrillation not on oral anticoagulation, asthma, HIV with noncompliance, history of pulmonary embolism, who presents to [facility] ED with complaints of 6 weeks of left-sided weakness involving his left arm and left leg.  Associated with frequent falls.  Has not been taking his ART medications.  MRI of the brain showing right-sided T2 hypertense signal on the MRI primarily cortical/subcortical, in the right greater than left insula and postcentral gyrus, neurology and ID were consulted and he was admitted to the hospital for further care.                                                                  Hospital Course   Left-sided weakness leg significantly more weak than the arm.  MRI of the brain suggestive of T2 hypertense signal primarily  cortical/subcortical, in the right greater than left insula and postcentral gyrus, neurology and ID on board, he was noncompliant with his HIV medications.  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  Continue supportive care with PT OT.  Left lower extremity weakness much improved since 07/12/2022, left upper extremity now minimally weak only.  Patient is deciding between going to CIR versus home health with PT, will wait for his decision ( per CIR team he has agreed).  Post discharge follow-up with ID and neurology in 1 month.   HIV -  AIDs.  CD4 count less than 54 with CD4 helper T-cell percentage 5%.  Noncompliant with medications, counseled on compliance, ID on board defer management to ID.  He is now agreeable to taking his HIV medications as prescribed, see above.   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  Per ID requested ENT input, per ENT CT guided biopsy by IR if required or else outpatient ENT follow-up.  Discussed with Dr. Murlean Hark and ID physician Dr. Gwynn Burly.  He is s/p CT-guided biopsy of both parotid glands by IR on 07/10/2022, bilateral parotid gland FNAC done by IR appears unremarkable, prelim results noted on 07/12/2022, CSF autoimmune panel appears negative, CSF flow cytometry appears stable as well.   Dyslipidemia.  Placed on statin.   Asthma.  Stable no acute issues.   Obesity.  BMI 32.  Follow-up with PCP for weight loss.    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)   AIDS (acquired immune deficiency  syndrome) (HCC)    Discharge instructions    Discharge Instructions     Diet - low sodium heart healthy   Complete by: As directed    Discharge instructions   Complete by: As directed    Follow with Primary MD [name], [name] in 7 days   Get CBC, CMP, 2 view Chest X ray -  checked next visit with your primary MD or CIR MD    Activity: As tolerated with Full fall precautions use walker/cane & assistance as needed  Disposition CIR  Diet: Heart Healthy    Special Instructions: If you have smoked or chewed Tobacco  in the last 2 yrs please stop smoking, stop any regular Alcohol  and or any Recreational drug use.  On your next visit with your primary care physician please Get Medicines reviewed and adjusted.  Please request your Prim.MD to go over all Hospital Tests and Procedure/Radiological results at the follow up, please get all Hospital records sent to your Prim MD by signing hospital release before you go home.  If you experience worsening of your admission symptoms, develop shortness of breath, life threatening emergency, suicidal or homicidal thoughts you must seek medical attention immediately by calling 911 or calling your MD immediately  if symptoms less severe.  You Must read complete instructions/literature along with all the possible adverse reactions/side effects for all the Medicines you take and that have been prescribed to you. Take any new Medicines after you have completely understood and accpet all the possible adverse reactions/side effects.   For home use only DME 4 wheeled rolling walker with seat   Complete by: As directed    Patient needs a walker to treat with the following condition: Weakness   Increase activity slowly   Complete by: As directed    No wound care   Complete by: As directed        Discharge Medications   Allergies as of 07/14/2022       Reactions   Asa [aspirin] Other (See Comments)   Nose bleeds        Medication List      STOP taking these medications    cephALEXin 500 MG capsule Commonly known as: KEFLEX       TAKE these medications  albuterol 108 (90 Base) MCG/ACT inhaler Commonly known as: VENTOLIN HFA 1 puff every 4 (four) hours as needed for wheezing or shortness of breath.   albuterol (2.5 MG/3ML) 0.083% nebulizer solution Commonly known as: PROVENTIL Take 2.5 mg by nebulization every 4 (four) hours.   ascorbic acid 500 MG tablet Commonly known as: VITAMIN C Take 1 tablet (500 mg total) by mouth daily.   Biktarvy 50-200-25 MG Tabs tablet Generic drug: bictegravir-emtricitabine-tenofovir AF Take 1 tablet by mouth daily.   cyanocobalamin 1000 MCG tablet Commonly known as: VITAMIN B12 Take 1 tablet (1,000 mcg total) by mouth daily.   oxyCODONE-acetaminophen 10-325 MG tablet Commonly known as: PERCOCET Take 1 tablet by mouth every 6 (six) hours.   rosuvastatin 10 MG tablet Commonly known as: CRESTOR Take 1 tablet (10 mg total) by mouth daily.   sulfamethoxazole-trimethoprim 800-160 MG tablet Commonly known as: BACTRIM DS Take 1 tablet by mouth daily.   valACYclovir 1000 MG tablet Commonly known as: Valtrex Take 1 tablet (1,000 mg total) by mouth 3 (three) times daily for 9 days.               Durable Medical Equipment  (From admission, onward)           Start     Ordered   07/14/22 0000  For home use only DME 4 wheeled rolling walker with seat       Question:  Patient needs a walker to treat with the following condition  Answer:  Weakness   07/14/22 0950              Major procedures and Radiology Reports - PLEASE review detailed and final reports thoroughly  -      EEG adult  Result Date: 07/13/2022 Charlsie Quest, MD     07/13/2022 10:05 PM Patient Name: Jose Lamb MRN: [id number] Epilepsy Attending: Charlsie Quest Referring Physician/Provider: Leroy Sea, MD Date: 07/13/2022 Duration: 23.28 mins Patient history:  62 year old male  with a 6-week history of increasing left-sided weakness and hemisensory deficits getting eeg to evaluate for seizure. Level of alertness: Awake, asleep AEDs during EEG study: None Technical aspects: This EEG study was done with scalp electrodes positioned according to the 10-20 International system of electrode placement. Electrical activity was reviewed with band pass filter of 1-70Hz , sensitivity of 7 uV/mm, display speed of 68mm/sec with a 60Hz  notched filter applied as appropriate. EEG data were recorded continuously and digitally stored.  Video monitoring was available and reviewed as appropriate. Description: The posterior dominant rhythm consists of 9 Hz activity of moderate voltage (25-35 uV) seen predominantly in posterior head regions, symmetric and reactive to eye opening and eye closing. Sleep was characterized by vertex waves, sleep spindles (12 to 14 Hz), maximal frontocentral region. Physiologic photic driving was seen during photic stimulation.  Hyperventilation was not performed.   IMPRESSION: This study is within normal limits. No seizures or epileptiform discharges were seen throughout the recording. A normal interictal EEG does not exclude the diagnosis of epilepsy. Priyanka Annabelle Harman   [place] FNA SALIVARY GLAND/PAROTID GLAND  Result Date: 07/11/2022 INDICATION: Indeterminate bilateral parotid cysts. Please perform ultrasound-guided aspiration for tissue diagnostic purposes. EXAM: 1. ULTRASOUND-GUIDED FINE-NEEDLE ASPIRATION OF RIGHT PAROTID CYST 2. ULTRASOUND-GUIDED FINE-NEEDLE ASPIRATION OF THE LEFT PAROTID CYST COMPARISON:  Brain MRI-06/27/2022; cervical spine CT-01/30/2021 MEDICATIONS: None ANESTHESIA/SEDATION: None CONTRAST:  None COMPLICATIONS: None immediate. PROCEDURE: Informed written consent was obtained from the patient after a discussion of the risks, benefits and alternatives to  treatment. Preprocedural ultrasound scanning demonstrated an approximately 4.4 x 2.6 cm minimally complex  right-sided parotid cyst (image 4), as well as an approximately 3.3 x 2.0 cm left-sided parotid cyst (image 8). A timeout was performed prior to the initiation of the procedure. The skin overlying the operative sites were prepped and draped in usual sterile fashion. Attention was first paid towards aspiration of the right-sided parotid cyst. After the overlying soft tissues were anesthetized with 1% lidocaine with epinephrine, an 18 gauge trocar needle was advanced into the cyst. Multiple ultrasound images were saved procedural documentation purposes. Next, a proximally 25 cc of tan colored fluid was aspirated from the right parotid cyst. Postprocedural imaging was obtained demonstrating near complete resolution of the right-sided parotid cyst. The identical procedure was then performed left-sided parotid cyst yielding 10 cc of similar appearing tan colored fluid. Postprocedural imaging was obtained demonstrating near complete resolution of the left-sided parotid cyst. All aspirated fluid was capped and sent separately to the laboratory for analysis. Dressings were applied. The patient tolerated the procedure well without immediate postprocedural complication. IMPRESSION: 1. Successful ultrasound-guided aspiration of 25 cc of tan colored fluid from the right-sided parotid cyst. 2. Successful ultrasound-guided aspiration of 10 cc of tan colored fluid from the left-sided parotid cyst. 3. All aspirated fluid was capped and sent separately to the laboratory for analysis. Electronically Signed   By: Simonne Come M.D.   On: 07/11/2022 10:36   [place] FNA BIOPSY SALIVARY GLAND PAROTID GLAND EA ADDT'L LESION  Result Date: 07/11/2022 INDICATION: Indeterminate bilateral parotid cysts. Please perform ultrasound-guided aspiration for tissue diagnostic purposes. EXAM: 1. ULTRASOUND-GUIDED FINE-NEEDLE ASPIRATION OF RIGHT PAROTID CYST 2. ULTRASOUND-GUIDED FINE-NEEDLE ASPIRATION OF THE LEFT PAROTID CYST COMPARISON:  Brain  MRI-06/27/2022; cervical spine CT-01/30/2021 MEDICATIONS: None ANESTHESIA/SEDATION: None CONTRAST:  None COMPLICATIONS: None immediate. PROCEDURE: Informed written consent was obtained from the patient after a discussion of the risks, benefits and alternatives to treatment. Preprocedural ultrasound scanning demonstrated an approximately 4.4 x 2.6 cm minimally complex right-sided parotid cyst (image 4), as well as an approximately 3.3 x 2.0 cm left-sided parotid cyst (image 8). A timeout was performed prior to the initiation of the procedure. The skin overlying the operative sites were prepped and draped in usual sterile fashion. Attention was first paid towards aspiration of the right-sided parotid cyst. After the overlying soft tissues were anesthetized with 1% lidocaine with epinephrine, an 18 gauge trocar needle was advanced into the cyst. Multiple ultrasound images were saved procedural documentation purposes. Next, a proximally 25 cc of tan colored fluid was aspirated from the right parotid cyst. Postprocedural imaging was obtained demonstrating near complete resolution of the right-sided parotid cyst. The identical procedure was then performed left-sided parotid cyst yielding 10 cc of similar appearing tan colored fluid. Postprocedural imaging was obtained demonstrating near complete resolution of the left-sided parotid cyst. All aspirated fluid was capped and sent separately to the laboratory for analysis. Dressings were applied. The patient tolerated the procedure well without immediate postprocedural complication. IMPRESSION: 1. Successful ultrasound-guided aspiration of 25 cc of tan colored fluid from the right-sided parotid cyst. 2. Successful ultrasound-guided aspiration of 10 cc of tan colored fluid from the left-sided parotid cyst. 3. All aspirated fluid was capped and sent separately to the laboratory for analysis. Electronically Signed   By: Simonne Come M.D.   On: 07/11/2022 10:36   DG Lumbar  Puncture Fluoro Guide  Result Date: 07/09/2022 CLINICAL DATA:  Left-sided weakness with concern for encephalitis. Request for image guided  lumbar puncture. EXAM: LUMBAR PUNCTURE UNDER FLUOROSCOPY PROCEDURE: An appropriate skin entry site was determined fluoroscopically. Operator donned sterile gloves and mask. Skin site was marked, then prepped with Betadine, draped in usual sterile fashion, and infiltrated locally with 1% lidocaine. A 6 inch, 20 gauge spinal needle advanced into the thecal sac at L3-4 from a right interlaminar approach. Clear colorless CSF spontaneously returned, with opening pressure of 25 cm water. Approximately 13 ml CSF were collected and divided among 4 sterile vials for the requested laboratory studies. The needle was then removed. The patient tolerated the procedure well and there were no complications. FLUOROSCOPY: Radiation Exposure Index (as provided by the fluoroscopic device): 6.5 mGy Kerma IMPRESSION: Technically successful lumbar puncture under fluoroscopy. This exam was performed by Corrin Parker, [name], and was supervised and interpreted by Dr. Marliss Coots. Electronically Signed   By: Marliss Coots M.D.   On: 07/09/2022 18:03   MR BRAIN W CONTRAST  Result Date: 07/08/2022 CLINICAL DATA:  Headache, no red flags EXAM: MRI HEAD WITH CONTRAST TECHNIQUE: Multiplanar, multiecho pulse sequences of the brain and surrounding structures were obtained with intravenous contrast. CONTRAST:  10mL GADAVIST GADOBUTROL 1 MMOL/ML IV SOLN COMPARISON:  MRI head 07/07/2022 FINDINGS: Axial T1 pre and postcontrast, coronal T1 postcontrast, and coronal T2 sequences obtained. No abnormal parenchymal or meningeal enhancement. Redemonstrated areas of T1 hyperintense signal in the right greater than left insula and frontoparietal region. IMPRESSION: No abnormal parenchymal or meningeal enhancement. Electronically Signed   By: Wiliam Ke M.D.   On: 07/08/2022 20:32   MR BRAIN WO CONTRAST  Result Date:  07/07/2022 CLINICAL DATA:  Transient ischemic attack EXAM: MRI HEAD WITHOUT CONTRAST TECHNIQUE: Multiplanar, multiecho pulse sequences of the brain and surrounding structures were obtained without intravenous contrast. COMPARISON:  None Available. FINDINGS: Brain: Increased T2 hyperintense signal along the right-greater-than-left insula (series 9, image 27), which is associated with mildly increased signal on diffusion-weighted imaging, with decreased intensity on the ADC map. On the right, this is associated with a more focal area of T1 and T2 hyperintense signal, with surrounding edema (series 8, image 18, series 9, image 35 and series 12, image 111). T2 hyperintense signal is also noted in right thalamus, extending into the midbrain (series 4, images 24-25), although this could be related to wallerian degeneration. Similar abnormal cortical signal is noted in the right postcentral gyrus (series 9, image 46), which is associated with a similar T2 hyperintense, T1 hypointense area with peripheral edema in the right frontoparietal region (series 9, image 39). Less significant T2 hyperintense signal is noted in the left postcentral gyrus (series 9, image 36), without the more superior cortical involvement. Increased T2 signal in the splenium of the corpus callosum (series 9, image 31) and in the left body (series 9, image 35), which is not definitively associated with restricted diffusion. No acute hemorrhage, mass, mass effect, or midline shift. No hydrocephalus or extra-axial collection. Partial empty sella. Craniocervical junction. No hemosiderin deposition to suggest remote hemorrhage. Vascular: Normal arterial flow voids. Skull and upper cervical spine: Normal marrow signal. Sinuses/Orbits: Mucosal thickening left maxillary sinus. No acute finding in the orbits. Other: The mastoid air cells are well aerated. Bilateral parotid masses are better seen on the prior CT and are incompletely imaged on this exam  IMPRESSION: 1. Abnormal T2 hyperintense signal, primarily cortical/subcortical, in the right greater than left insula and postcentral gyrus, as well as in the splenium and left body of the corpus callosum and possibly the right thalamus  and midbrain. This is nonspecific, and while this could reflect sequela of prior infarcts, it is also concerning for an inflammatory or infectious process, such as PML, autoimmune encephalitis, or herpes simplex encephalitis, although this is not particularly typical of any the entities. MRI with contrast could be helpful. Correlate with symptoms and consider CSF testing. 2. Bilateral parotid masses are better seen on the prior CT and are incompletely imaged on this exam. These results were called by telephone at the time of interpretation on 07/07/2022 at 10:30 pm to provider [facility] , who verbally acknowledged these results. Electronically Signed   By: Wiliam Ke M.D.   On: 07/07/2022 22:39   DG Chest Port 1 View  Result Date: 07/05/2022 CLINICAL DATA:  Questionable sepsis - evaluate for abnormality EXAM: PORTABLE CHEST 1 VIEW COMPARISON:  April 05, 2022. FINDINGS: Similar enlarged cardiac silhouette. Pulmonary vascular congestion. No overt pulmonary edema. No consolidation. No visible pleural effusions or pneumothorax. Polyarticular degenerative change. IMPRESSION: Similar cardiomegaly and pulmonary vascular congestion without overt pulmonary edema. Electronically Signed   By: Feliberto Harts M.D.   On: 07/05/2022 09:54   MR LUMBAR SPINE WO CONTRAST  Result Date: 06/23/2022 CLINICAL DATA:  Fecal incontinence. Off balance. No known injury or surgery. EXAM: MRI LUMBAR SPINE WITHOUT CONTRAST TECHNIQUE: Multiplanar, multisequence MR imaging of the lumbar spine was performed. No intravenous contrast was administered. COMPARISON:  CT scan 12/14/2021 FINDINGS: Examination is somewhat limited by patient motion. Segmentation: There are five lumbar type vertebral bodies. The  last full intervertebral disc space is labeled L5-S1. Alignment:  Normal Vertebrae:  Normal marrow signal.  No bone lesions or fractures. Conus medullaris and cauda equina: Conus extends to the L1 level. Conus and cauda equina appear normal. Paraspinal and other soft tissues: No significant paraspinal retroperitoneal findings. Disc levels: T12-L1 no significant findings. L1-2: Mild to moderate facet disease but no disc protrusions spinal foraminal stenosis. L2-3: Moderate to advanced facet disease short pedicles and mild annular bulge with mild bilateral lateral recess stenosis. No significant spinal foraminal stenosis. L3-4: Diffuse bulging annulus, short pedicles and facet disease contributing to moderate spinal and bilateral lateral recess stenosis. There is also a E shallow broad-based right foraminal and extraforaminal disc protrusion which appears to contact and displace the right L3 nerve root. L4-5: Degenerative disc disease and facet disease with early spinal stenosis and mild bilateral lateral recess stenosis. Shallow broad-based right-sided disc osteophyte complex and right-sided facet disease contributing to right foraminal and extraforaminal encroachment on the right L4 nerve root which is displaced posteriorly extra foraminally. L5-S1: Advanced facet disease but no disc protrusions spinal foraminal stenosis. IMPRESSION: 1. Mild bilateral lateral recess stenosis at L2-3. 2. Moderate spinal and bilateral lateral recess stenosis at L3-4. There is also a shallow broad-based right foraminal and extraforaminal disc protrusion which appears to contact and displace the right L3 nerve root. 3. Early spinal stenosis and mild bilateral lateral recess stenosis at L4-5. There is also a shallow broad-based right-sided disc osteophyte complex and right-sided facet disease contributing to right foraminal and extraforaminal encroachment on the right L4 nerve root which is displaced posteriorly extraforaminally.  Electronically Signed   By: Rudie Meyer M.D.   On: 06/23/2022 08:23    Micro Results     Recent Results (from the past 240 hour(s))  Urine Culture     Status: Abnormal   Collection Time: 07/05/22  9:19 AM   Specimen: Urine, Clean Catch  Result Value Ref Range Status   Specimen Description  Final    URINE, CLEAN CATCH Performed at [facility], [address] [place], [place] [address]    Special Requests   Final    NONE Reflexed from [id number] Performed at [facility], [address] [place], [place] [address]    Culture MULTIPLE SPECIES PRESENT, SUGGEST RECOLLECTION (A)  Final   Report Status 07/06/2022 FINAL  Final  Blood Culture (routine x 2)     Status: None   Collection Time: 07/05/22  9:46 AM   Specimen: Left Antecubital; Blood  Result Value Ref Range Status   Specimen Description LEFT ANTECUBITAL  Final   Special Requests   Final    BOTTLES DRAWN AEROBIC AND ANAEROBIC Blood Culture results may not be optimal due to an excessive volume of blood received in culture bottles   Culture   Final    NO GROWTH 6 DAYS Performed at [facility], [address] [place], [place] [address]    Report Status 07/11/2022 FINAL  Final  Blood Culture (routine x 2)     Status: None   Collection Time: 07/05/22  9:46 AM   Specimen: BLOOD RIGHT ARM  Result Value Ref Range Status   Specimen Description BLOOD RIGHT ARM  Final   Special Requests   Final    BOTTLES DRAWN AEROBIC AND ANAEROBIC Blood Culture results may not be optimal due to an excessive volume of blood received in culture bottles   Culture   Final    NO GROWTH 6 DAYS Performed at [facility], [address], [place], [place] [address]    Report Status 07/11/2022 FINAL  Final  Resp panel by RT-PCR (RSV, Flu A&B, Covid) Anterior Nasal Swab     Status: None   Collection Time: 07/05/22  9:47 AM   Specimen: Anterior Nasal Swab  Result Value Ref Range Status   SARS Coronavirus 2 by RT PCR NEGATIVE NEGATIVE Final     Comment: (NOTE) SARS-CoV-2 target nucleic acids are NOT DETECTED.  The SARS-CoV-2 RNA is generally detectable in upper respiratory specimens during the acute phase of infection. The lowest concentration of SARS-CoV-2 viral copies this assay can detect is 138 copies/mL. A negative result does not preclude SARS-Cov-2 infection and should not be used as the sole basis for treatment or other patient management decisions. A negative result may occur with  improper specimen collection/handling, submission of specimen other than nasopharyngeal swab, presence of viral mutation(s) within the areas targeted by this assay, and inadequate number of viral copies(<138 copies/mL). A negative result must be combined with clinical observations, patient history, and epidemiological information. The expected result is Negative.  Fact Sheet for Patients:  BloggerCourse.com  Fact Sheet for Healthcare Providers:  SeriousBroker.it  This test is no t yet approved or cleared by the [place] FDA and  has been authorized for detection and/or diagnosis of SARS-CoV-2 by FDA under an Emergency Use Authorization (EUA). This EUA will remain  in effect (meaning this test can be used) for the duration of the COVID-19 declaration under Section 564(b)(1) of the Act, 21 U.S.C.section 360bbb-3(b)(1), unless the authorization is terminated  or revoked sooner.       Influenza A by PCR NEGATIVE NEGATIVE Final   Influenza B by PCR NEGATIVE NEGATIVE Final    Comment: (NOTE) The Xpert Xpress SARS-CoV-2/FLU/RSV plus assay is intended as an aid in the diagnosis of influenza from Nasopharyngeal swab specimens and should not be used as a sole basis for treatment. Nasal washings and aspirates are  unacceptable for Xpert Xpress SARS-CoV-2/FLU/RSV testing.  Fact Sheet for Patients: BloggerCourse.com  Fact Sheet for Healthcare  Providers: SeriousBroker.it  This test is not yet approved or cleared by the [place] FDA and has been authorized for detection and/or diagnosis of SARS-CoV-2 by FDA under an Emergency Use Authorization (EUA). This EUA will remain in effect (meaning this test can be used) for the duration of the COVID-19 declaration under Section 564(b)(1) of the Act, 21 U.S.C. section 360bbb-3(b)(1), unless the authorization is terminated or revoked.     Resp Syncytial Virus by PCR NEGATIVE NEGATIVE Final    Comment: (NOTE) Fact Sheet for Patients: BloggerCourse.com  Fact Sheet for Healthcare Providers: SeriousBroker.it  This test is not yet approved or cleared by the [place] FDA and has been authorized for detection and/or diagnosis of SARS-CoV-2 by FDA under an Emergency Use Authorization (EUA). This EUA will remain in effect (meaning this test can be used) for the duration of the COVID-19 declaration under Section 564(b)(1) of the Act, 21 U.S.C. section 360bbb-3(b)(1), unless the authorization is terminated or revoked.  Performed at [facility], [address], [place], [place] [address]   Urine Culture     Status: Abnormal   Collection Time: 07/07/22  7:21 PM   Specimen: Urine, Clean Catch  Result Value Ref Range Status   Specimen Description   Final    URINE, CLEAN CATCH Performed at [facility], [address], [place], [place] [address]    Special Requests   Final    NONE Performed at [facility], [address], [place], [place] [address]    Culture (A)  Final    <10,000 COLONIES/mL INSIGNIFICANT GROWTH Performed at [facility], [address], [place], [place] [address]    Report Status 07/08/2022 FINAL  Final  CSF culture w Gram Stain     Status: None   Collection Time: 07/08/22 12:30 PM   Specimen: CSF; Cerebrospinal Fluid  Result Value Ref  Range Status   Specimen Description   Final    CSF Performed at [facility], [address], [place], [place] [address]    Special Requests   Final    NONE Performed at [facility], [address], [place], [place] [address]    Gram Stain   Final    WBC PRESENT,BOTH PMN AND MONONUCLEAR NO ORGANISMS SEEN CYTOSPIN Gram Stain Report Called to,Read Back By and Verified With: Derl Barrow RN @1425  ON 6.1.2024 BY [facility] Performed at [facility], [address], [place], [place] [address]    Culture   Final    NO GROWTH 3 DAYS Performed at [facility], [address] [place], [place] [address]    Report Status 07/12/2022 FINAL  Final    Today   Subjective    Jose Lamb today has no headache,no chest abdominal pain,no new weakness tingling or numbness, feels much better wants to go home today.     Objective   Blood pressure 123/79, pulse 87, temperature 99.6 F (37.6 C), temperature source Oral, resp. rate (!) 21, height 5\' 11"  (1.803 m), weight 104.3 kg, SpO2 96 %.   Intake/Output Summary (Last 24 hours) at 07/14/2022 1417 Last data filed at 07/14/2022 0900 Gross per 24 hour  Intake 240 ml  Output 2625 ml  Net -2385 ml    Exam  Awake Alert, No new F.N deficits,    [place].AT,PERRAL Supple Neck,  Symmetrical Chest wall movement, Good air movement bilaterally, CTAB RRR,No Gallops,   +ve B.Sounds, Abd Soft, Non tender,  No Cyanosis, Clubbing or edema    Data Review   Recent Labs  Lab 07/07/22 1819 07/07/22 1841 07/08/22 0500 07/11/22 0216 07/12/22 0630 07/13/22 0709 07/14/22 0622  WBC 4.1  --  3.9* 5.3 3.6* 3.9* 4.1  HGB 13.1   < > 12.1* 12.5* 12.1* 12.4* 12.3*  HCT 39.9   < > 37.7* 37.7* 36.4* 37.2* 37.0*  PLT 112*  --  108* 97* 92* 106* 110*  MCV 91.5  --  91.7 87.1 87.1 87.1 88.1  MCH 30.0  --  29.4 28.9 28.9 29.0 29.3  MCHC 32.8  --  32.1 33.2 33.2 33.3 33.2  RDW 14.4  --  14.4 13.6 13.5  13.4 13.3  LYMPHSABS 1.0  --   --  1.4 1.1 1.3 1.1  MONOABS 0.6  --   --  0.6 0.3 0.3 0.3  EOSABS 0.2  --   --  0.0 0.2 0.2 0.1  BASOSABS 0.0  --   --  0.0 0.0 0.0 0.0   < > = values in this interval not displayed.    Recent Labs  Lab 07/07/22 1819 07/07/22 1841 07/08/22 0500 07/08/22 1405 07/09/22 0323 07/10/22 0546 07/11/22 0216 07/12/22 0630 07/13/22 0709 07/14/22 0622  NA 135   < > 138  --    < > 134* 132* 134* 137 135  K 3.4*   < > 3.2*  --    < > 3.8 3.8 3.6 3.7 3.5  CL 102   < > 105  --    < > 102 99 102 104 107  CO2 25  --  28  --    < > 26 22 23 24  20*  ANIONGAP 8  --  5  --    < > 6 11 9 9 8   GLUCOSE 95   < > 104*  --    < > 99 111* 119* 99 106*  BUN 14   < > 13  --    < > 7 7 7 7 8   CREATININE 0.78   < > 0.79  --    < > 0.75 0.83 0.87 0.89 0.81  AST 21  --   --   --   --   --   --   --   --   --   ALT 24  --   --   --   --   --   --   --   --   --   ALKPHOS 67  --   --   --   --   --   --   --   --   --   BILITOT 0.6  --   --   --   --   --   --   --   --   --   ALBUMIN 3.6  --   --  NOT PERFORMED  --   --   --   --   --   --   INR 1.0  --   --   --   --  1.1  --   --   --   --   BNP  --   --   --   --   --   --  70.9 31.9 57.0 49.1  MG  --   --  2.3  --   --   --  2.0 2.3 2.1 2.1  CALCIUM 8.4*  --  8.6*  --    < > 8.6* 8.6* 8.4* 8.5* 8.6*   < > = values in this interval not displayed.    Total Time in preparing paper work, data evaluation and todays exam - 35 minutes  Signature  -    Susa Raring M.D on 07/14/2022 at 2:17 PM   -  To page go to [email or web address]

## 2022-07-15 ENCOUNTER — Inpatient Hospital Stay (HOSPITAL_COMMUNITY)
Admission: AD | Admit: 2022-07-15 | Discharge: 2022-07-19 | DRG: 175 | Disposition: A | Discharge status: Rehabilitation Facility | Payer: BC Managed Care – PPO | Source: Ambulatory Visit | Attending: Internal Medicine | Admitting: Internal Medicine

## 2022-07-15 ENCOUNTER — Inpatient Hospital Stay (HOSPITAL_COMMUNITY): Payer: BC Managed Care – PPO

## 2022-07-15 ENCOUNTER — Other Ambulatory Visit (HOSPITAL_COMMUNITY): Payer: Self-pay

## 2022-07-15 DIAGNOSIS — Z8616 Personal history of COVID-19: Secondary | ICD-10-CM

## 2022-07-15 DIAGNOSIS — I82461 Acute embolism and thrombosis of right calf muscular vein: Secondary | ICD-10-CM | POA: Diagnosis present

## 2022-07-15 DIAGNOSIS — R079 Chest pain, unspecified: Secondary | ICD-10-CM | POA: Diagnosis not present

## 2022-07-15 DIAGNOSIS — I2699 Other pulmonary embolism without acute cor pulmonale: Principal | ICD-10-CM | POA: Diagnosis present

## 2022-07-15 DIAGNOSIS — G8114 Spastic hemiplegia affecting left nondominant side: Secondary | ICD-10-CM | POA: Diagnosis not present

## 2022-07-15 DIAGNOSIS — K111 Hypertrophy of salivary gland: Secondary | ICD-10-CM | POA: Diagnosis present

## 2022-07-15 DIAGNOSIS — J9811 Atelectasis: Secondary | ICD-10-CM | POA: Diagnosis not present

## 2022-07-15 DIAGNOSIS — D6832 Hemorrhagic disorder due to extrinsic circulating anticoagulants: Secondary | ICD-10-CM | POA: Diagnosis not present

## 2022-07-15 DIAGNOSIS — E119 Type 2 diabetes mellitus without complications: Secondary | ICD-10-CM | POA: Diagnosis present

## 2022-07-15 DIAGNOSIS — R4189 Other symptoms and signs involving cognitive functions and awareness: Secondary | ICD-10-CM | POA: Diagnosis not present

## 2022-07-15 DIAGNOSIS — E222 Syndrome of inappropriate secretion of antidiuretic hormone: Secondary | ICD-10-CM | POA: Diagnosis not present

## 2022-07-15 DIAGNOSIS — R296 Repeated falls: Secondary | ICD-10-CM | POA: Diagnosis present

## 2022-07-15 DIAGNOSIS — F0280 Dementia in other diseases classified elsewhere without behavioral disturbance: Secondary | ICD-10-CM | POA: Diagnosis not present

## 2022-07-15 DIAGNOSIS — B20 Human immunodeficiency virus [HIV] disease: Secondary | ICD-10-CM | POA: Diagnosis present

## 2022-07-15 DIAGNOSIS — Z86711 Personal history of pulmonary embolism: Secondary | ICD-10-CM

## 2022-07-15 DIAGNOSIS — Z86718 Personal history of other venous thrombosis and embolism: Secondary | ICD-10-CM

## 2022-07-15 DIAGNOSIS — Z6832 Body mass index (BMI) 32.0-32.9, adult: Secondary | ICD-10-CM

## 2022-07-15 DIAGNOSIS — I10 Essential (primary) hypertension: Secondary | ICD-10-CM | POA: Diagnosis present

## 2022-07-15 DIAGNOSIS — A812 Progressive multifocal leukoencephalopathy: Secondary | ICD-10-CM | POA: Diagnosis not present

## 2022-07-15 DIAGNOSIS — Z79899 Other long term (current) drug therapy: Secondary | ICD-10-CM | POA: Diagnosis not present

## 2022-07-15 DIAGNOSIS — E785 Hyperlipidemia, unspecified: Secondary | ICD-10-CM | POA: Diagnosis present

## 2022-07-15 DIAGNOSIS — G928 Other toxic encephalopathy: Secondary | ICD-10-CM | POA: Diagnosis not present

## 2022-07-15 DIAGNOSIS — Z8673 Personal history of transient ischemic attack (TIA), and cerebral infarction without residual deficits: Secondary | ICD-10-CM

## 2022-07-15 DIAGNOSIS — I7121 Aneurysm of the ascending aorta, without rupture: Secondary | ICD-10-CM | POA: Diagnosis present

## 2022-07-15 DIAGNOSIS — R414 Neurologic neglect syndrome: Secondary | ICD-10-CM | POA: Diagnosis not present

## 2022-07-15 DIAGNOSIS — I493 Ventricular premature depolarization: Secondary | ICD-10-CM | POA: Diagnosis present

## 2022-07-15 DIAGNOSIS — I480 Paroxysmal atrial fibrillation: Secondary | ICD-10-CM | POA: Diagnosis not present

## 2022-07-15 DIAGNOSIS — G9341 Metabolic encephalopathy: Secondary | ICD-10-CM | POA: Diagnosis not present

## 2022-07-15 DIAGNOSIS — Z21 Asymptomatic human immunodeficiency virus [HIV] infection status: Secondary | ICD-10-CM | POA: Diagnosis present

## 2022-07-15 DIAGNOSIS — B003 Herpesviral meningitis: Secondary | ICD-10-CM | POA: Diagnosis present

## 2022-07-15 DIAGNOSIS — J45909 Unspecified asthma, uncomplicated: Secondary | ICD-10-CM | POA: Diagnosis present

## 2022-07-15 DIAGNOSIS — I2609 Other pulmonary embolism with acute cor pulmonale: Secondary | ICD-10-CM

## 2022-07-15 DIAGNOSIS — E669 Obesity, unspecified: Secondary | ICD-10-CM | POA: Diagnosis present

## 2022-07-15 DIAGNOSIS — Z91148 Patient's other noncompliance with medication regimen for other reason: Secondary | ICD-10-CM

## 2022-07-15 DIAGNOSIS — Z66 Do not resuscitate: Secondary | ICD-10-CM | POA: Diagnosis not present

## 2022-07-15 DIAGNOSIS — Z515 Encounter for palliative care: Secondary | ICD-10-CM | POA: Diagnosis not present

## 2022-07-15 LAB — BLOOD CULTURE ID PANEL (REFLEXED) - BCID2

## 2022-07-15 LAB — CBC WITH DIFFERENTIAL/PLATELET
Abs Immature Granulocytes: 0.04 10*3/uL (ref 0.00–0.07)
Basophils Absolute: 0 10*3/uL (ref 0.0–0.1)
Basophils Relative: 0 %
Eosinophils Absolute: 0.1 10*3/uL (ref 0.0–0.5)
Eosinophils Relative: 1 %
HCT: 40 % (ref 39.0–52.0)
Hemoglobin: 13.4 g/dL (ref 13.0–17.0)
Immature Granulocytes: 1 %
Lymphocytes Relative: 19 %
Lymphs Abs: 1.1 10*3/uL (ref 0.7–4.0)
MCH: 29.3 pg (ref 26.0–34.0)
MCHC: 33.5 g/dL (ref 30.0–36.0)
MCV: 87.5 fL (ref 80.0–100.0)
Monocytes Absolute: 0.4 10*3/uL (ref 0.1–1.0)
Monocytes Relative: 8 %
Neutro Abs: 3.9 10*3/uL (ref 1.7–7.7)
Neutrophils Relative %: 71 %
Platelets: 114 10*3/uL — ABNORMAL LOW (ref 150–400)
RBC: 4.57 MIL/uL (ref 4.22–5.81)
RDW: 13.4 % (ref 11.5–15.5)
WBC: 5.5 10*3/uL (ref 4.0–10.5)
nRBC: 0 % (ref 0.0–0.2)

## 2022-07-15 LAB — ECHOCARDIOGRAM COMPLETE
AR max vel: 2.92 cm2
AV Area VTI: 3.15 cm2
AV Area mean vel: 2.51 cm2
AV Mean grad: 4 mmHg
AV Peak grad: 7.2 mmHg
Ao pk vel: 1.34 m/s
S' Lateral: 2.6 cm
Weight: 3354.55 oz

## 2022-07-15 LAB — COMPREHENSIVE METABOLIC PANEL
ALT: 18 U/L (ref 0–44)
AST: 16 U/L (ref 15–41)
Albumin: 3.3 g/dL — ABNORMAL LOW (ref 3.5–5.0)
Alkaline Phosphatase: 65 U/L (ref 38–126)
Anion gap: 11 (ref 5–15)
BUN: 10 mg/dL (ref 6–20)
CO2: 21 mmol/L — ABNORMAL LOW (ref 22–32)
Calcium: 8.9 mg/dL (ref 8.9–10.3)
Chloride: 103 mmol/L (ref 98–111)
Creatinine, Ser: 0.92 mg/dL (ref 0.61–1.24)
GFR, Estimated: >60 mL/min (ref >60)
Glucose, Bld: 118 mg/dL — ABNORMAL HIGH (ref 70–99)
Potassium: 3.6 mmol/L (ref 3.5–5.1)
Sodium: 135 mmol/L (ref 135–145)
Total Bilirubin: 0.8 mg/dL (ref 0.3–1.2)
Total Protein: 8 g/dL (ref 6.5–8.1)

## 2022-07-15 LAB — HEPARIN LEVEL (UNFRACTIONATED)
Heparin Unfractionated: 0.53 IU/mL (ref 0.30–0.70)
Heparin Unfractionated: 0.45 IU/mL (ref 0.30–0.70)

## 2022-07-15 LAB — CULTURE, BLOOD (ROUTINE X 2): Special Requests: ADEQUATE

## 2022-07-15 LAB — TROPONIN I (HIGH SENSITIVITY)
Troponin I (High Sensitivity): 16 ng/L (ref <18)
Troponin I (High Sensitivity): 14 ng/L (ref <18)

## 2022-07-15 MED ORDER — MORPHINE SULFATE (PF) 2 MG/ML IV SOLN
1.0000 mg | INTRAVENOUS | Status: AC | PRN
  Administered 2022-07-15 (×3): 1 mg via INTRAVENOUS
  Filled 2022-07-15 (×3): qty 1

## 2022-07-15 MED ORDER — ACETAMINOPHEN 325 MG PO TABS
650.0000 mg | ORAL_TABLET | Freq: Four times a day (QID) | ORAL | Status: DC | PRN
  Administered 2022-07-19: 650 mg via ORAL
  Filled 2022-07-15: qty 2

## 2022-07-15 MED ORDER — BISACODYL 10 MG RE SUPP: 10.0000 mg | Freq: Every day | RECTAL | Status: DC | PRN

## 2022-07-15 MED ORDER — KETOROLAC TROMETHAMINE 30 MG/ML IJ SOLN
30.0000 mg | Freq: Once | INTRAMUSCULAR | Status: AC
  Administered 2022-07-15: 30 mg via INTRAVENOUS
  Filled 2022-07-15: qty 1

## 2022-07-15 MED ORDER — ALBUTEROL SULFATE (2.5 MG/3ML) 0.083% IN NEBU: 2.5000 mg | INHALATION_SOLUTION | RESPIRATORY_TRACT | Status: DC | PRN

## 2022-07-15 MED ORDER — BICTEGRAVIR-EMTRICITAB-TENOFOV 50-200-25 MG PO TABS
1.0000 | ORAL_TABLET | Freq: Every day | ORAL | Status: DC
  Administered 2022-07-15 – 2022-07-19 (×5): 1 via ORAL
  Filled 2022-07-15 (×6): qty 1

## 2022-07-15 MED ORDER — ROSUVASTATIN CALCIUM 5 MG PO TABS
10.0000 mg | ORAL_TABLET | Freq: Every day | ORAL | Status: DC
  Administered 2022-07-15 – 2022-07-19 (×5): 10 mg via ORAL
  Filled 2022-07-15 (×5): qty 2

## 2022-07-15 MED ORDER — SODIUM CHLORIDE 0.9% FLUSH
3.0000 mL | Freq: Two times a day (BID) | INTRAVENOUS | Status: DC
  Administered 2022-07-15 – 2022-07-19 (×8): 3 mL via INTRAVENOUS

## 2022-07-15 MED ORDER — MORPHINE SULFATE (PF) 2 MG/ML IV SOLN
1.0000 mg | INTRAVENOUS | Status: AC | PRN
  Administered 2022-07-15 – 2022-07-19 (×3): 1 mg via INTRAVENOUS
  Filled 2022-07-15 (×3): qty 1

## 2022-07-15 MED ORDER — ACETAMINOPHEN 650 MG RE SUPP: 650.0000 mg | Freq: Four times a day (QID) | RECTAL | Status: DC | PRN

## 2022-07-15 MED ORDER — MORPHINE SULFATE (PF) 2 MG/ML IV SOLN: 1.0000 mg | INTRAVENOUS | Status: DC | PRN

## 2022-07-15 MED ORDER — SULFAMETHOXAZOLE-TRIMETHOPRIM 800-160 MG PO TABS
1.0000 | ORAL_TABLET | Freq: Every day | ORAL | Status: DC
  Administered 2022-07-15 – 2022-07-19 (×5): 1 via ORAL
  Filled 2022-07-15 (×5): qty 1

## 2022-07-15 MED ORDER — SENNOSIDES-DOCUSATE SODIUM 8.6-50 MG PO TABS: 1.0000 | ORAL_TABLET | Freq: Every evening | ORAL | Status: DC | PRN

## 2022-07-15 MED ORDER — VALACYCLOVIR HCL 500 MG PO TABS
1000.0000 mg | ORAL_TABLET | Freq: Three times a day (TID) | ORAL | Status: DC
  Administered 2022-07-15 – 2022-07-19 (×13): 1000 mg via ORAL
  Filled 2022-07-15 (×15): qty 2

## 2022-07-15 MED ORDER — ONDANSETRON HCL 4 MG/2ML IJ SOLN: 4.0000 mg | Freq: Four times a day (QID) | INTRAMUSCULAR | Status: DC | PRN

## 2022-07-15 MED ORDER — ONDANSETRON HCL 4 MG PO TABS: 4.0000 mg | ORAL_TABLET | Freq: Four times a day (QID) | ORAL | Status: DC | PRN

## 2022-07-15 MED ORDER — HEPARIN (PORCINE) 25000 UT/250ML-% IV SOLN
1800.0000 [IU]/h | INTRAVENOUS | Status: DC
  Administered 2022-07-15 (×2): 1600 [IU]/h via INTRAVENOUS
  Administered 2022-07-16: 1800 [IU]/h via INTRAVENOUS
  Administered 2022-07-16: 1600 [IU]/h via INTRAVENOUS
  Filled 2022-07-15 (×3): qty 250

## 2022-07-15 MED ORDER — VITAMIN B-12 1000 MCG PO TABS
1000.0000 ug | ORAL_TABLET | Freq: Every day | ORAL | Status: DC
  Administered 2022-07-15 – 2022-07-19 (×5): 1000 ug via ORAL
  Filled 2022-07-15 (×6): qty 1

## 2022-07-15 NOTE — Progress Notes (Signed)
ANTICOAGULATION CONSULT NOTE  Pharmacy Consult for heparin Indication: pulmonary embolus  Allergies  Allergen Reactions   Asa [Aspirin] Other (See Comments)    Epistaxis   Patient Measurements: Weight: 95.1 kg (209 lb 10.6 oz)  Vital Signs: Temp: 97.8 F (36.6 C) (06/08 0800) Temp Source: Oral (06/08 1200) BP: 149/100 (06/08 1200) Pulse Rate: 95 (06/08 1200)  Labs: Recent Labs    07/14/22 0622 07/14/22 2055 07/15/22 0127 07/15/22 0340 07/15/22 0614 07/15/22 1217  HGB 12.3* 13.1 13.4  --   --   --   HCT 37.0* 39.4 40.0  --   --   --   PLT 110* 116* 114*  --   --   --   HEPARINUNFRC  --   --   --   --  0.53 0.45  CREATININE 0.81 0.90 0.92  --   --   --   TROPONINIHS  --   --  16 14  --   --     Estimated Creatinine Clearance: 100.5 mL/min (by C-G formula based on SCr of 0.92 mg/dL).  Medical History: Past Medical History:  Diagnosis Date   Atrial fibrillation (HCC)    In the setting of hyperthyroidism   Bilateral pulmonary embolism (HCC)    Diagnosed August 2021   Diabetes mellitus without complication (HCC)    Essential hypertension    HIV antibody positive (HCC)    Perforated duodenal ulcer (HCC) 2015   Contained without need for surgical intervention   Pneumonia due to COVID-19 virus    Diagnosed August 2021   Medications:  Medications Prior to Admission  Medication Sig Dispense Refill Last Dose   albuterol (PROVENTIL) (2.5 MG/3ML) 0.083% nebulizer solution Take 2.5 mg by nebulization every 4 (four) hours.      albuterol (VENTOLIN HFA) 108 (90 Base) MCG/ACT inhaler 1 puff every 4 (four) hours as needed for wheezing or shortness of breath.      ascorbic acid (VITAMIN C) 500 MG tablet Take 1 tablet (500 mg total) by mouth daily. 30 tablet 1    bictegravir-emtricitabine-tenofovir AF (BIKTARVY) 50-200-25 MG TABS tablet Take 1 tablet by mouth daily. 30 tablet 2    cyanocobalamin 1000 MCG tablet Take 1 tablet (1,000 mcg total) by mouth daily. 30 tablet 0     oxyCODONE-acetaminophen (PERCOCET) 10-325 MG tablet Take 1 tablet by mouth every 6 (six) hours.      rosuvastatin (CRESTOR) 10 MG tablet Take 1 tablet (10 mg total) by mouth daily. 30 tablet 0    sulfamethoxazole-trimethoprim (BACTRIM DS) 800-160 MG tablet Take 1 tablet by mouth daily. 30 tablet 2    valACYclovir (VALTREX) 1000 MG tablet Take 1 tablet (1,000 mg total) by mouth 3 (three) times daily for 9 days. 27 tablet 0    Scheduled:   bictegravir-emtricitabine-tenofovir AF  1 tablet Oral Daily   cyanocobalamin  1,000 mcg Oral Daily   rosuvastatin  10 mg Oral Daily   sodium chloride flush  3 mL Intravenous Q12H   sulfamethoxazole-trimethoprim  1 tablet Oral Daily   valACYclovir  1,000 mg Oral TID   Assessment: 62yo male had been admitted on 5/31 for weakness and treated for PML, transferred to CIR 6/7 and found to be tachycardic with tachypnea and fever. Patient transferred back to [facility] on 6/7. CT reveals extensive bilateral PE w/ mod-large thrombus volume. Pharmacy consulted to dose heparin. Pt has Afib but not on anticoagulation prior to admission; had previously been on apixaban for PE in 2021 thought  to be provoked by Covid. Heparin level today is therapeutic at 0.45, on 1600 units/hr. Hgb 13.4, plt 114 stable. No line issues or signs/symptoms of bleeding noted.   Goal of Therapy:  Heparin level 0.3-0.7 units/ml Monitor platelets by anticoagulation protocol: Yes   Plan:  Continue heparin 1600 units/hr. Daily CBC, heparin level. Monitor for signs/symptoms of bleeding.  Georga Hacking, Pharm.D PGY1 Pharmacy Resident 07/15/2022 1:25 PM

## 2022-07-15 NOTE — Progress Notes (Addendum)
Report given to RN Brien Few, for patient being transferred to 5W-18. Did give sputum culture but needs to be repeated. Sputum was thick clear with bright red blood. Upon returning back from CT patient connected to purewick. Orders acknowledged. Charge RN assisting with admin of IV heparin orders from Hospitalist. At 2120 Hospitalist in room assessing patient writer in room as well at that time. Patient endorsed chest pain and cough for four days. At 2340 endorsing chest pain as an "8". Explains chest pain to be a pressure to lower left area of his chest. Explains chest pain being a pressure and when breathing explains pain becomes worse.

## 2022-07-15 NOTE — Progress Notes (Signed)
  Echocardiogram 2D Echocardiogram has been performed.  Jose Lamb 07/15/2022, 10:02 AM

## 2022-07-15 NOTE — Progress Notes (Signed)
PROGRESS NOTE                                                                                                                                                                                                             Patient Demographics:    Jose Lamb, is a 62 y.o. male, DOB - 01/01/1961, ZOX:[id number]  Outpatient Primary MD for the patient is [name], [name]    LOS - 0  Admit date - 07/15/2022    No chief complaint on file.      Brief Narrative (HPI from H&P)    62 y.o. male with medical history significant for prior CVA, paroxysmal atrial fibrillation not on oral anticoagulation, asthma, HIV with noncompliance, history of pulmonary embolism, who presents to [facility] ED with complaints of 6 weeks of left-sided weakness involving his left arm and left leg.  Associated with frequent falls.  Has not been taking his ART medications.  MRI of the brain showing right-sided T2 hypertense signal on the MRI primarily cortical/subcortical, in the right greater than left insula and postcentral gyrus, neurology and ID were consulted and he was admitted to the hospital for further care.  Patient had improved was transferred to CIR on 07/14/2022 subsequently at [facility] he developed some chest discomfort after which she was found to have a large PE on CTA and transferred back to [facility] acute care side.  Note patient has previous history of PE as well but had been off anticoagulation for several years, he was on heparin prophylactic dose while he was in the hospital.   Subjective:   Patient in bed, appears comfortable, denies any headache, no fever, no chest pain or pressure, no shortness of breath , no abdominal pain. No new focal weakness. Improved +++ L Leg weakness   Assessment  & Plan :    Acute PE and right lower extremity DVT.  History of PE in the past, was not on chronic anticoagulation anymore question compliance being the  issue, he is currently on heparin drip, hemodynamically stable and currently symptom-free, troponin negative, echo pending.  Continue to monitor on heparin drip for another 24 hours, likely will require lifelong anticoagulation thereafter.     Left-sided weakness leg significantly more weak than the arm.  MRI of the brain suggestive of T2 hypertense signal primarily cortical/subcortical, in the right  greater than left insula and postcentral gyrus, neurology and ID on board, he was noncompliant with his HIV medications.  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  Continue supportive care with PT OT.  Left lower extremity weakness much improved since 07/12/2022, left upper extremity now minimally weak only.  Patient is deciding between going to CIR versus home health with PT, will wait for his decision.  Post discharge follow-up with ID and neurology in 1 month.   HIV - AIDs.  CD4 count less than 54 with CD4 helper T-cell percentage 5%.  Noncompliant with medications, counseled on compliance, ID on board defer management to ID.  He is now agreeable to taking his HIV medications as prescribed, see above.  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  Per ID requested ENT input, per ENT CT guided biopsy by IR if required or else outpatient ENT follow-up.  Discussed with Dr. Murlean Hark and ID physician Dr. Gwynn Burly.  He is s/p CT-guided biopsy of both parotid glands by IR on 07/10/2022, bilateral parotid gland FNAC done by IR appears unremarkable, prelim results noted on 07/12/2022, CSF autoimmune panel appears negative, CSF flow cytometry appears stable as well.  Dyslipidemia.  Placed on statin.  Asthma.  Stable no acute issues.  Obesity.  BMI 32.  Follow-up  with PCP for weight loss.      Condition -   Guarded  Family Communication  : Updated wife on 07/11/2022  Code Status :  Full  Consults  :  ID, Neuro, ENT, IR  PUD Prophylaxis :    Procedures  :     CTA  1. Extensive bilateral pulmonary emboli with moderate-to-large thrombus volume and evidence of right heart strain. 2. Distended pulmonary trunk suggesting underlying pulmonary artery hypertension. 3. Patchy airspace disease in the lungs bilaterally, possible atelectasis or infiltrate. 4. Aneurysmal dilatation of the ascending aorta measuring 4.5 cm. Ascending thoracic aortic aneurysm. Recommend semi-annual imaging followup by CTA or MRA and referral to cardiothoracic surgery if not already obtained. This recommendation follows 2010 ACCF/AHA/AATS/ACR/ASA/SCA/SCAI/SIR/STS/SVM Guidelines for the Diagnosis and Management of Patients With Thoracic Aortic Disease. Circulation. 2010; 121: X324-M010. Aortic aneurysm NOS (ICD10-I71.9) 5. Cardiomegaly with scattered coronary artery calcifications  Leg [place] - R Leg acute DVT  TTE  MRI - 1. Abnormal T2 hyperintense signal, primarily cortical/subcortical, in the right greater than left insula and postcentral gyrus, as well as in the splenium and left body of the corpus callosum and possibly the right thalamus and midbrain. This is nonspecific, and while this could reflect sequela of prior infarcts, it is also concerning for an inflammatory or infectious process, such as PML, autoimmune encephalitis, or herpes simplex encephalitis, although this is not particularly typical of any the entities. MRI with contrast could be helpful. Correlate with symptoms and consider CSF testing. 2. Bilateral parotid masses are better seen on the prior CT and are incompletely imaged on this exam.      Disposition Plan  :      DVT Prophylaxis  :        Lab Results  Component Value Date   PLT 114 (L) 07/15/2022    Diet :  Diet Order             Diet  Heart Room  service appropriate? Yes with Assist; Fluid consistency: Thin  Diet effective now                    Inpatient Medications  Scheduled Meds:  bictegravir-emtricitabine-tenofovir AF  1 tablet Oral Daily   cyanocobalamin  1,000 mcg Oral Daily   rosuvastatin  10 mg Oral Daily   sodium chloride flush  3 mL Intravenous Q12H   sulfamethoxazole-trimethoprim  1 tablet Oral Daily   valACYclovir  1,000 mg Oral TID   Continuous Infusions:  heparin 1,600 Units/hr (07/15/22 0142)    PRN Meds:.acetaminophen **OR** acetaminophen, albuterol, bisacodyl, ondansetron **OR** ondansetron (ZOFRAN) IV, senna-docusate  Antibiotics  :    Anti-infectives (From admission, onward)    Start     Dose/Rate Route Frequency Ordered Stop   07/15/22 1000  bictegravir-emtricitabine-tenofovir AF (BIKTARVY) 50-200-25 MG per tablet 1 tablet        1 tablet Oral Daily 07/15/22 0048     07/15/22 1000  sulfamethoxazole-trimethoprim (BACTRIM DS) 800-160 MG per tablet 1 tablet        1 tablet Oral Daily 07/15/22 0048     07/15/22 1000  valACYclovir (VALTREX) tablet 1,000 mg        1,000 mg Oral 3 times daily 07/15/22 0048           Objective:   Vitals:   07/15/22 0125 07/15/22 0127 07/15/22 0500  BP: 133/89    Pulse: 88 76   Resp: 20 20   Temp: 98.7 F (37.1 C)    TempSrc: Oral    SpO2: 93% 94%   Weight:   95.1 kg    Wt Readings from Last 3 Encounters:  07/15/22 95.1 kg  07/14/22 95.3 kg  07/07/22 104.3 kg     Intake/Output Summary (Last 24 hours) at 07/15/2022 1039 Last data filed at 07/15/2022 0618 Gross per 24 hour  Intake 73.16 ml  Output 400 ml  Net -326.84 ml     Physical Exam  Awake Alert, No new F.N deficits, L leg 2/5, L arm mildly weak [place].AT,PERRAL Supple Neck, No JVD,   Symmetrical Chest wall movement, Good air movement bilaterally, CTAB RRR,No Gallops,Rubs or new Murmurs,  +ve B.Sounds, Abd Soft, No tenderness,   No Cyanosis, Clubbing or edema        Data Review:     Recent Labs  Lab 07/12/22 0630 07/13/22 0709 07/14/22 0622 07/14/22 2055 07/15/22 0127  WBC 3.6* 3.9* 4.1 5.4 5.5  HGB 12.1* 12.4* 12.3* 13.1 13.4  HCT 36.4* 37.2* 37.0* 39.4 40.0  PLT 92* 106* 110* 116* 114*  MCV 87.1 87.1 88.1 86.8 87.5  MCH 28.9 29.0 29.3 28.9 29.3  MCHC 33.2 33.3 33.2 33.2 33.5  RDW 13.5 13.4 13.3 13.3 13.4  LYMPHSABS 1.1 1.3 1.1 1.1 1.1  MONOABS 0.3 0.3 0.3 0.5 0.4  EOSABS 0.2 0.2 0.1 0.0 0.1  BASOSABS 0.0 0.0 0.0 0.0 0.0    Recent Labs  Lab 07/08/22 1405 07/09/22 0323 07/10/22 0546 07/11/22 0216 07/12/22 0630 07/13/22 0709 07/14/22 0622 07/14/22 2055 07/15/22 0127  NA  --    < > 134* 132* 134* 137 135 136 135  K  --    < > 3.8 3.8 3.6 3.7 3.5 3.6 3.6  CL  --    < > 102 99 102 104 107 105 103  CO2  --    < > 26 22 23 24  20* 18* 21*  ANIONGAP  --    < >  6 11 9 9 8 13 11   GLUCOSE  --    < > 99 111* 119* 99 106* 118* 118*  BUN  --    < > 7 7 7 7 8 10 10   CREATININE  --    < > 0.75 0.83 0.87 0.89 0.81 0.90 0.92  AST  --   --   --   --   --   --   --  14* 16  ALT  --   --   --   --   --   --   --  16 18  ALKPHOS  --   --   --   --   --   --   --  62 65  BILITOT  --   --   --   --   --   --   --  0.8 0.8  ALBUMIN NOT PERFORMED  --   --   --   --   --   --  3.3* 3.3*  PROCALCITON  --   --   --   --   --   --   --  <0.10  --   LATICACIDVEN  --   --   --   --   --   --   --  0.9  --   INR  --   --  1.1  --   --   --   --   --   --   BNP  --   --   --  70.9 31.9 57.0 49.1  --   --   MG  --   --   --  2.0 2.3 2.1 2.1  --   --   CALCIUM  --    < > 8.6* 8.6* 8.4* 8.5* 8.6* 8.8* 8.9   < > = values in this interval not displayed.      Recent Labs  Lab 07/10/22 0546 07/11/22 0216 07/12/22 0630 07/13/22 0709 07/14/22 0622 07/14/22 2055 07/15/22 0127  PROCALCITON  --   --   --   --   --  <0.10  --   LATICACIDVEN  --   --   --   --   --  0.9  --   INR 1.1  --   --   --   --   --   --   BNP  --  70.9 31.9 57.0 49.1  --   --   MG  --  2.0 2.3  2.1 2.1  --   --   CALCIUM 8.6* 8.6* 8.4* 8.5* 8.6* 8.8* 8.9     Lab Results  Component Value Date   CHOL 197 04/10/2022   HDL 39 (L) 04/10/2022   LDLCALC 126 (H) 04/10/2022   TRIG 158 (H) 04/10/2022   CHOLHDL 5.1 04/10/2022    Lab Results  Component Value Date   HGBA1C 6.6 (H) 04/10/2022      Micro Results Recent Results (from the past 240 hour(s))  Urine Culture     Status: Abnormal   Collection Time: 07/07/22  7:21 PM   Specimen: Urine, Clean Catch  Result Value Ref Range Status   Specimen Description   Final    URINE, CLEAN CATCH Performed at [facility], [address], [place], [place] [address]    Special Requests   Final    NONE Performed at [facility], [address] [place],  [place] [address]    Culture (A)  Final    <10,000 COLONIES/mL INSIGNIFICANT GROWTH Performed at [facility], [address] [place], [place] [address]    Report Status 07/08/2022 FINAL  Final  CSF culture w Gram Stain     Status: None   Collection Time: 07/08/22 12:30 PM   Specimen: CSF; Cerebrospinal Fluid  Result Value Ref Range Status   Specimen Description   Final    CSF Performed at [facility], [address] [place], [place] [address]    Special Requests   Final    NONE Performed at [facility], [address] [place], [place] [address]    Gram Stain   Final    WBC PRESENT,BOTH PMN AND MONONUCLEAR NO ORGANISMS SEEN CYTOSPIN Gram Stain Report Called to,Read Back By and Verified With: Derl Barrow RN @1425  ON 6.1.2024 BY [facility] Performed at [facility], [address] [place], [place] [address]    Culture   Final    NO GROWTH 3 DAYS Performed at [facility], [address] [place], [place] [address]    Report Status 07/12/2022 FINAL  Final  SARS Coronavirus 2 by RT PCR (hospital order, performed in [facility] hospital lab) *cepheid single result test*  Anterior Nasal Swab     Status: None   Collection Time: 07/14/22  8:25 PM   Specimen: Anterior Nasal Swab  Result Value Ref Range Status   SARS Coronavirus 2 by RT PCR NEGATIVE NEGATIVE Final    Comment: Performed at [facility], [address], [place], [place] [address]  Expectorated Sputum Assessment w Gram Stain, Rflx to Resp Cult     Status: None   Collection Time: 07/14/22  8:27 PM   Specimen: Expectorated Sputum  Result Value Ref Range Status   Specimen Description EXPECTORATED SPUTUM  Final   Special Requests Immunocompromised  Final   Sputum evaluation   Final    Sputum specimen not acceptable for testing.  Please recollect.   Gram Stain Report Called to,Read Back By and Verified With: RN Roe Coombs [phone] @ 2248 FH Performed at [facility], [address], [place], [place] [address]    Report Status 07/14/2022 FINAL  Final  Culture, blood (Routine X 2) w Reflex to ID Panel     Status: None (Preliminary result)   Collection Time: 07/14/22  8:55 PM   Specimen: BLOOD LEFT ARM  Result Value Ref Range Status   Specimen Description BLOOD LEFT ARM  Final   Special Requests   Final    BOTTLES DRAWN AEROBIC AND ANAEROBIC Blood Culture adequate volume   Culture   Final    NO GROWTH < 12 HOURS Performed at [facility], [address], [place], [place] [address]    Report Status PENDING  Incomplete  Culture, blood (Routine X 2) w Reflex to ID Panel     Status: None (Preliminary result)   Collection Time: 07/14/22  9:06 PM   Specimen: BLOOD RIGHT ARM  Result Value Ref Range Status   Specimen Description BLOOD RIGHT ARM  Final   Special Requests   Final    BOTTLES DRAWN AEROBIC AND ANAEROBIC Blood Culture adequate volume   Culture   Final    NO GROWTH < 12 HOURS Performed at [facility], [address] [place], [place] [address]    Report Status PENDING  Incomplete  Radiology Reports VAS [place] LOWER EXTREMITY VENOUS (DVT)  Result Date:  07/15/2022  Lower Venous DVT Study Patient Name:  TYLEEK WHILES  Date of Exam:   07/15/2022 Medical Rec #: [id number]       Accession #:    [id number] Date of Birth: [birth year]       Patient Gender: M Patient Age:   74 years Exam Location:  [facility] Procedure:      VAS [place] LOWER EXTREMITY VENOUS (DVT) Referring Phys: Eston Esters PATEL --------------------------------------------------------------------------------  Indications: Pulmonary embolism.  Anticoagulation: Heparin. Limitations: Poor ultrasound/tissue interface and body habitus. Comparison Study: No previous study. Performing Technologist: McKayla Maag RVT, [place]  Examination Guidelines: A complete evaluation includes B-mode imaging, spectral Doppler, color Doppler, and power Doppler as needed of all accessible portions of each vessel. Bilateral testing is considered an integral part of a complete examination. Limited examinations for reoccurring indications may be performed as noted. The reflux portion of the exam is performed with the patient in reverse Trendelenburg.  +---------+---------------+---------+-----------+----------+--------------+ RIGHT    CompressibilityPhasicitySpontaneityPropertiesThrombus Aging +---------+---------------+---------+-----------+----------+--------------+ CFV      Full           Yes      Yes                                 +---------+---------------+---------+-----------+----------+--------------+ SFJ      Full                                                        +---------+---------------+---------+-----------+----------+--------------+ FV Prox  Full                                                        +---------+---------------+---------+-----------+----------+--------------+ FV Mid   Full                                                        +---------+---------------+---------+-----------+----------+--------------+ FV DistalFull                                                         +---------+---------------+---------+-----------+----------+--------------+ PFV      Full                                                        +---------+---------------+---------+-----------+----------+--------------+ POP      Full           Yes      Yes                                 +---------+---------------+---------+-----------+----------+--------------+  PTV      Full                                                        +---------+---------------+---------+-----------+----------+--------------+ PERO                                                  Not visualized +---------+---------------+---------+-----------+----------+--------------+ Gastroc  None           No       No                   Acute          +---------+---------------+---------+-----------+----------+--------------+   +---------+---------------+---------+-----------+----------+--------------+ LEFT     CompressibilityPhasicitySpontaneityPropertiesThrombus Aging +---------+---------------+---------+-----------+----------+--------------+ CFV      Full           Yes      Yes                                 +---------+---------------+---------+-----------+----------+--------------+ SFJ      Full                                                        +---------+---------------+---------+-----------+----------+--------------+ FV Prox  Full                                                        +---------+---------------+---------+-----------+----------+--------------+ FV Mid   Full                                                        +---------+---------------+---------+-----------+----------+--------------+ FV DistalFull                                                        +---------+---------------+---------+-----------+----------+--------------+ PFV      Full                                                         +---------+---------------+---------+-----------+----------+--------------+ POP      Full           Yes      Yes                                 +---------+---------------+---------+-----------+----------+--------------+ PTV  Full                                                        +---------+---------------+---------+-----------+----------+--------------+ PERO                                                  Not visualized +---------+---------------+---------+-----------+----------+--------------+     Summary: RIGHT: - Findings consistent with acute deep vein thrombosis involving the right gastrocnemius veins. - Portions of this examination were limited- see technologist comments above. - No cystic structure found in the popliteal fossa.  LEFT: - There is no evidence of deep vein thrombosis in the lower extremity. However, portions of this examination were limited- see technologist comments above.  - No cystic structure found in the popliteal fossa.  *See table(s) above for measurements and observations. Electronically signed by Gerarda Fraction on 07/15/2022 at 9:39:10 AM.    Final    CT Angio Chest Pulmonary Embolism (PE) W or WO Contrast  Addendum Date: 07/14/2022   ADDENDUM REPORT: 07/14/2022 23:07 ADDENDUM: Critical Value/emergent results were called by telephone at the time of interpretation on 07/14/2022 at 11:07 pm to provider VISHAL PATEL , who verbally acknowledged these results. Electronically Signed   By: Thornell Sartorius M.D.   On: 07/14/2022 23:07   Result Date: 07/14/2022 CLINICAL DATA:  Pulmonary embolism suspected, high probability. EXAM: CT ANGIOGRAPHY CHEST WITH CONTRAST TECHNIQUE: Multidetector CT imaging of the chest was performed using the standard protocol during bolus administration of intravenous contrast. Multiplanar CT image reconstructions and MIPs were obtained to evaluate the vascular anatomy. RADIATION DOSE REDUCTION: This exam was performed according to the  departmental dose-optimization program which includes automated exposure control, adjustment of the mA and/or kV according to patient size and/or use of iterative reconstruction technique. CONTRAST:  75mL OMNIPAQUE IOHEXOL 350 MG/ML SOLN COMPARISON:  04/06/2022. FINDINGS: Cardiovascular: Heart is mildly enlarged and there is a trace pericardial effusion. Scattered coronary artery calcifications are present. There is aneurysmal dilatation of the ascending aorta measuring 4.5 cm. The pulmonary trunk is distended suggesting underlying pulmonary artery hypertension. There are bilateral pulmonary emboli involving lobar, segmental, and subsegmental arteries with moderate-to-large thrombus burden. The right ventricle is distended suggesting underlying pulmonary artery hypertension. Mediastinum/Nodes: No mediastinal, hilar, or axillary lymphadenopathy by size criteria. The thyroid gland, trachea, and esophagus are within normal limits. Lungs/Pleura: Patchy airspace disease is noted bilaterally and most pronounced in the lower lobes. No effusion or pneumothorax. Upper Abdomen: No acute abnormality. Musculoskeletal: Old rib fractures are noted on the left. There are degenerative changes in the thoracic spine. No acute fracture. Review of the MIP images confirms the above findings. IMPRESSION: 1. Extensive bilateral pulmonary emboli with moderate-to-large thrombus volume and evidence of right heart strain. 2. Distended pulmonary trunk suggesting underlying pulmonary artery hypertension. 3. Patchy airspace disease in the lungs bilaterally, possible atelectasis or infiltrate. 4. Aneurysmal dilatation of the ascending aorta measuring 4.5 cm. Ascending thoracic aortic aneurysm. Recommend semi-annual imaging followup by CTA or MRA and referral to cardiothoracic surgery if not already obtained. This recommendation follows 2010 ACCF/AHA/AATS/ACR/ASA/SCA/SCAI/SIR/STS/SVM Guidelines for the Diagnosis and Management of Patients With  Thoracic Aortic Disease. Circulation. 2010; 121: Z610-R604. Aortic aneurysm NOS (ICD10-I71.9)  5. Cardiomegaly with scattered coronary artery calcifications. Electronically Signed: By: Thornell Sartorius M.D. On: 07/14/2022 23:02   DG CHEST PORT 1 VIEW  Result Date: 07/14/2022 CLINICAL DATA:  Tachypnea EXAM: PORTABLE CHEST 1 VIEW COMPARISON:  07/05/2022 FINDINGS: Lung volumes are small and there is bibasilar atelectasis. No superimposed confluent pulmonary infiltrate. No pneumothorax or pleural effusion. Cardiac size within normal limits. Pulmonary vascularity is normal. No acute bone abnormality. IMPRESSION: 1. Pulmonary hypoinflation. Electronically Signed   By: Helyn Numbers M.D.   On: 07/14/2022 20:45   EEG adult  Result Date: 07/13/2022 Charlsie Quest, MD     07/13/2022 10:05 PM Patient Name: Siyon Baggott MRN: [id number] Epilepsy Attending: Charlsie Quest Referring Physician/Provider: Leroy Sea, MD Date: 07/13/2022 Duration: 23.28 mins Patient history:  62 year old male with a 6-week history of increasing left-sided weakness and hemisensory deficits getting eeg to evaluate for seizure. Level of alertness: Awake, asleep AEDs during EEG study: None Technical aspects: This EEG study was done with scalp electrodes positioned according to the 10-20 International system of electrode placement. Electrical activity was reviewed with band pass filter of 1-70Hz , sensitivity of 7 uV/mm, display speed of 31mm/sec with a 60Hz  notched filter applied as appropriate. EEG data were recorded continuously and digitally stored.  Video monitoring was available and reviewed as appropriate. Description: The posterior dominant rhythm consists of 9 Hz activity of moderate voltage (25-35 uV) seen predominantly in posterior head regions, symmetric and reactive to eye opening and eye closing. Sleep was characterized by vertex waves, sleep spindles (12 to 14 Hz), maximal frontocentral region. Physiologic photic driving was seen  during photic stimulation.  Hyperventilation was not performed.   IMPRESSION: This study is within normal limits. No seizures or epileptiform discharges were seen throughout the recording. A normal interictal EEG does not exclude the diagnosis of epilepsy. Charlsie Quest      Signature  -   Susa Raring M.D on 07/15/2022 at 10:39 AM   -  To page go to [email or web address]

## 2022-07-15 NOTE — Progress Notes (Addendum)
ANTICOAGULATION CONSULT NOTE  Pharmacy Consult for heparin Indication: pulmonary embolus  Allergies  Allergen Reactions   Asa [Aspirin] Other (See Comments)    Epistaxis   Patient Measurements: Weight: 95.1 kg (209 lb 10.6 oz)  Vital Signs: Temp: 98.7 F (37.1 C) (06/08 0125) Temp Source: Oral (06/08 0125) BP: 133/89 (06/08 0125) Pulse Rate: 76 (06/08 0127)  Labs: Recent Labs    07/14/22 0622 07/14/22 2055 07/15/22 0127 07/15/22 0340 07/15/22 0614  HGB 12.3* 13.1 13.4  --   --   HCT 37.0* 39.4 40.0  --   --   PLT 110* 116* 114*  --   --   HEPARINUNFRC  --   --   --   --  0.53  CREATININE 0.81 0.90 0.92  --   --   TROPONINIHS  --   --  16 14  --     Estimated Creatinine Clearance: 100.5 mL/min (by C-G formula based on SCr of 0.92 mg/dL).  Medical History: Past Medical History:  Diagnosis Date   Atrial fibrillation (HCC)    In the setting of hyperthyroidism   Bilateral pulmonary embolism (HCC)    Diagnosed August 2021   Diabetes mellitus without complication (HCC)    Essential hypertension    HIV antibody positive (HCC)    Perforated duodenal ulcer (HCC) 2015   Contained without need for surgical intervention   Pneumonia due to COVID-19 virus    Diagnosed August 2021   Medications:  Medications Prior to Admission  Medication Sig Dispense Refill Last Dose   albuterol (PROVENTIL) (2.5 MG/3ML) 0.083% nebulizer solution Take 2.5 mg by nebulization every 4 (four) hours.      albuterol (VENTOLIN HFA) 108 (90 Base) MCG/ACT inhaler 1 puff every 4 (four) hours as needed for wheezing or shortness of breath.      ascorbic acid (VITAMIN C) 500 MG tablet Take 1 tablet (500 mg total) by mouth daily. 30 tablet 1    bictegravir-emtricitabine-tenofovir AF (BIKTARVY) 50-200-25 MG TABS tablet Take 1 tablet by mouth daily. 30 tablet 2    cyanocobalamin 1000 MCG tablet Take 1 tablet (1,000 mcg total) by mouth daily. 30 tablet 0    oxyCODONE-acetaminophen (PERCOCET) 10-325 MG  tablet Take 1 tablet by mouth every 6 (six) hours.      rosuvastatin (CRESTOR) 10 MG tablet Take 1 tablet (10 mg total) by mouth daily. 30 tablet 0    sulfamethoxazole-trimethoprim (BACTRIM DS) 800-160 MG tablet Take 1 tablet by mouth daily. 30 tablet 2    valACYclovir (VALTREX) 1000 MG tablet Take 1 tablet (1,000 mg total) by mouth 3 (three) times daily for 9 days. 27 tablet 0    Scheduled:   bictegravir-emtricitabine-tenofovir AF  1 tablet Oral Daily   cyanocobalamin  1,000 mcg Oral Daily   rosuvastatin  10 mg Oral Daily   sodium chloride flush  3 mL Intravenous Q12H   sulfamethoxazole-trimethoprim  1 tablet Oral Daily   valACYclovir  1,000 mg Oral TID   Assessment: 62yo male had been admitted on 5/31 for weakness and treated for PML, transferred to CIR 6/7 and found to be tachycardic with tachypnea and fever. Patient transferred back to [facility] on 6/7. CT reveals extensive bilateral PE w/ mod-large thrombus volume. Pharmacy consulted to dose heparin. Pt has Afib but not on anticoagulation prior to admission; had previously been on apixaban for PE in 2021 thought to be provoked by Covid. Heparin level today is therapeutic at 0.53, on 1600 units/hr. Hgb 13.4, plt  114 stable. No line issues or signs/symptoms of bleeding noted.   Goal of Therapy:  Heparin level 0.3-0.7 units/ml Monitor platelets by anticoagulation protocol: Yes   Plan:  Continue heparin 1600 units/hr. Check 6 heparin level.  Daily CBC, heparin level. Monitor for signs/symptoms of bleeding.  Georga Hacking, Pharm.D PGY1 Pharmacy Resident 07/15/2022 7:38 AM

## 2022-07-15 NOTE — Progress Notes (Signed)
PHARMACY - PHYSICIAN COMMUNICATION CRITICAL VALUE ALERT - BLOOD CULTURE IDENTIFICATION (BCID)  Jose Lamb is an 62 y.o. male who presented to [facility] on 07/15/2022 with a chief complaint of 6 weeks of L sided weakness.  Assessment: WBC 5.5, afebrile. Bcx 1/4 GPC - BCID showing staph species. ID following.   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   Results for orders placed or performed during the hospital encounter of 07/14/22  Blood Culture ID Panel (Reflexed) (Collected: 07/14/2022  8:55 PM)  Result Value Ref Range   Enterococcus faecalis NOT DETECTED NOT DETECTED   Enterococcus Faecium NOT DETECTED NOT DETECTED   Listeria monocytogenes NOT DETECTED NOT DETECTED   Staphylococcus species DETECTED (A) NOT DETECTED   Staphylococcus aureus (BCID) NOT DETECTED NOT DETECTED   Staphylococcus epidermidis NOT DETECTED NOT DETECTED   Staphylococcus lugdunensis NOT DETECTED NOT DETECTED   Streptococcus species NOT DETECTED NOT DETECTED   Streptococcus agalactiae NOT DETECTED NOT DETECTED   Streptococcus pneumoniae NOT DETECTED NOT DETECTED   Streptococcus pyogenes NOT DETECTED NOT DETECTED   A.calcoaceticus-baumannii NOT DETECTED NOT DETECTED   Bacteroides fragilis NOT DETECTED NOT DETECTED   Enterobacterales NOT DETECTED NOT DETECTED   Enterobacter cloacae complex NOT DETECTED NOT DETECTED   Escherichia coli NOT DETECTED NOT DETECTED   Klebsiella aerogenes NOT DETECTED NOT DETECTED   Klebsiella oxytoca NOT DETECTED NOT DETECTED   Klebsiella pneumoniae NOT DETECTED NOT DETECTED   Proteus species NOT DETECTED NOT DETECTED   Salmonella species NOT DETECTED NOT DETECTED   Serratia marcescens NOT DETECTED NOT DETECTED   Haemophilus influenzae NOT DETECTED NOT DETECTED   Neisseria meningitidis NOT DETECTED NOT DETECTED    Pseudomonas aeruginosa NOT DETECTED NOT DETECTED   Stenotrophomonas maltophilia NOT DETECTED NOT DETECTED   Candida albicans NOT DETECTED NOT DETECTED   Candida auris NOT DETECTED NOT DETECTED   Candida glabrata NOT DETECTED NOT DETECTED   Candida krusei NOT DETECTED NOT DETECTED   Candida parapsilosis NOT DETECTED NOT DETECTED   Candida tropicalis NOT DETECTED NOT DETECTED   Cryptococcus neoformans/gattii NOT DETECTED NOT DETECTED   Thank you for allowing pharmacy to participate in this patient's care,  Sherron Monday, PharmD, BCCCP Clinical Pharmacist  Phone: [phone] 07/15/2022 4:38 PM  Please check AMION for all [facility] Pharmacy phone numbers After 10:00 PM, call Main Pharmacy [phone]

## 2022-07-15 NOTE — Progress Notes (Signed)
RT instructed patient on the use of a flutter valve. Patient is getting ready for transport, flutter valve placed obn patient bedside tray table.

## 2022-07-15 NOTE — IPOC Note (Signed)
Overall Plan of Care [facility]) Patient Details Name: Jose Lamb MRN: [id number] DOB: [birth year]  Admitting Diagnosis: <principal problem not specified>  Hospital Problems: Active Problems:   * No active hospital problems. *     Functional Problem List: Nursing Safety, Edema, Endurance, Medication Management, Motor, Pain, Skin Integrity  PT    OT    SLP    TR         Basic ADL's: OT       Advanced  ADL's: OT       Transfers: PT    OT       Locomotion: PT       Additional Impairments: OT    SLP        TR      Anticipated Outcomes Item Anticipated Outcome  Self Feeding    Swallowing      Basic self-care     Toileting      Bathroom Transfers    Bowel/Bladder  continent B/B  Transfers     Locomotion     Communication     Cognition     Pain  less than 4  Safety/Judgment  remain fall free   Therapy Plan:         Team Interventions: Nursing Interventions Patient/Family Education, Medication Management, Psychosocial Support, Skin Care/Wound Management, Disease Management/Prevention, Pain Management, Discharge Planning  PT interventions    OT Interventions    SLP Interventions    TR Interventions    SW/CM Interventions     Barriers to Discharge MD  Medical stability, Home enviroment access/loayout, Wound care, Lack of/limited family support, Medication compliance, and Nutritional means  Nursing Decreased caregiver support, Home environment access/layout, Wound Care, Weight, Medication compliance lives with spouse with B/B main level 2 steps no rails  PT      OT      SLP      SW       Team Discharge Planning: Destination: PT-  ,OT-   , SLP-  Projected Follow-up: PT- , OT-   , SLP-  Projected Equipment Needs: PT- , OT-  , SLP-  Equipment Details: PT- , OT-  Patient/family involved in discharge planning: PT-  ,  OT- , SLP-   MD ELOS: <1 day; sent out to acute care Medical Rehab Prognosis:  Poor Assessment: The patient has been  admitted for CIR therapies with the diagnosis of PML.Anticipated discharge destination is N/A, patient was sent back to acute care for pulmonary embolism within 24 hours of admission.        See Team Conference Notes for weekly updates to the plan of care

## 2022-07-15 NOTE — Progress Notes (Signed)
Bilateral lower extremity venous study completed.   Preliminary results relayed to RN.  Please see CV Procedures for preliminary results.  Julya Alioto, RVT  9:25 AM 07/15/22

## 2022-07-16 DIAGNOSIS — I2699 Other pulmonary embolism without acute cor pulmonale: Secondary | ICD-10-CM | POA: Diagnosis not present

## 2022-07-16 LAB — CBC WITH DIFFERENTIAL/PLATELET
Abs Immature Granulocytes: 0.02 10*3/uL (ref 0.00–0.07)
Basophils Absolute: 0 10*3/uL (ref 0.0–0.1)
Basophils Relative: 1 %
Eosinophils Absolute: 0.1 10*3/uL (ref 0.0–0.5)
Eosinophils Relative: 3 %
HCT: 35.9 % — ABNORMAL LOW (ref 39.0–52.0)
Hemoglobin: 12.1 g/dL — ABNORMAL LOW (ref 13.0–17.0)
Immature Granulocytes: 0 %
Lymphocytes Relative: 17 %
Lymphs Abs: 0.8 10*3/uL (ref 0.7–4.0)
MCH: 29.6 pg (ref 26.0–34.0)
MCHC: 33.7 g/dL (ref 30.0–36.0)
MCV: 87.8 fL (ref 80.0–100.0)
Monocytes Absolute: 0.5 10*3/uL (ref 0.1–1.0)
Monocytes Relative: 10 %
Neutro Abs: 3.3 10*3/uL (ref 1.7–7.7)
Neutrophils Relative %: 69 %
Platelets: 136 10*3/uL — ABNORMAL LOW (ref 150–400)
RBC: 4.09 MIL/uL — ABNORMAL LOW (ref 4.22–5.81)
RDW: 13.2 % (ref 11.5–15.5)
WBC: 4.8 10*3/uL (ref 4.0–10.5)
nRBC: 0 % (ref 0.0–0.2)

## 2022-07-16 LAB — COMPREHENSIVE METABOLIC PANEL
ALT: 14 U/L (ref 0–44)
AST: 14 U/L — ABNORMAL LOW (ref 15–41)
Albumin: 2.9 g/dL — ABNORMAL LOW (ref 3.5–5.0)
Alkaline Phosphatase: 57 U/L (ref 38–126)
Anion gap: 15 (ref 5–15)
BUN: 13 mg/dL (ref 6–20)
CO2: 20 mmol/L — ABNORMAL LOW (ref 22–32)
Calcium: 9 mg/dL (ref 8.9–10.3)
Chloride: 102 mmol/L (ref 98–111)
Creatinine, Ser: 0.84 mg/dL (ref 0.61–1.24)
GFR, Estimated: >60 mL/min (ref >60)
Glucose, Bld: 101 mg/dL — ABNORMAL HIGH (ref 70–99)
Potassium: 3.6 mmol/L (ref 3.5–5.1)
Sodium: 137 mmol/L (ref 135–145)
Total Bilirubin: 0.7 mg/dL (ref 0.3–1.2)
Total Protein: 7.1 g/dL (ref 6.5–8.1)

## 2022-07-16 LAB — CULTURE, BLOOD (ROUTINE X 2)

## 2022-07-16 LAB — BRAIN NATRIURETIC PEPTIDE: B Natriuretic Peptide: 65.7 pg/mL (ref 0.0–100.0)

## 2022-07-16 LAB — HEPARIN LEVEL (UNFRACTIONATED): Heparin Unfractionated: 0.31 IU/mL (ref 0.30–0.70)

## 2022-07-16 LAB — MAGNESIUM: Magnesium: 2.1 mg/dL (ref 1.7–2.4)

## 2022-07-16 NOTE — Plan of Care (Signed)

## 2022-07-16 NOTE — Progress Notes (Signed)
ANTICOAGULATION CONSULT NOTE - Follow Up Consult  Pharmacy Consult for heparin Indication: pulmonary embolus   Allergies  Allergen Reactions   Asa [Aspirin] Other (See Comments)    Epistaxis   Patient Measurements: Weight: 96.9 kg (213 lb 10 oz)  Vital Signs: Temp: 98 F (36.7 C) (06/09 0824) Temp Source: Oral (06/09 0824) BP: 126/84 (06/09 0824) Pulse Rate: 62 (06/09 0824)  Labs: Recent Labs    07/14/22 2055 07/15/22 0127 07/15/22 0340 07/15/22 0614 07/15/22 1217 07/16/22 0426  HGB 13.1 13.4  --   --   --  12.1*  HCT 39.4 40.0  --   --   --  35.9*  PLT 116* 114*  --   --   --  136*  HEPARINUNFRC  --   --   --  0.53 0.45 0.31  CREATININE 0.90 0.92  --   --   --  0.84  TROPONINIHS  --  16 14  --   --   --    Estimated Creatinine Clearance: 111 mL/min (by C-G formula based on SCr of 0.84 mg/dL).  Medications:  Infusions:   heparin 1,600 Units/hr (07/16/22 0459)   Assessment: 62yo male had been admitted on 5/31 for weakness and treated for PML, transferred to CIR 6/7 and found to be tachycardic with tachypnea and fever. Patient transferred back to [facility] on 6/7. CT reveals extensive bilateral PE w/ mod-large thrombus volume. Pharmacy consulted to dose heparin. Pt has Afib but not on anticoagulation prior to admission; had previously been on apixaban for PE in 2021 thought to be provoked by Covid. Heparin level today is at the lower range of therapeutic at 0.31, on 1600 units/hr. Hgb 12.1, plt 136 stable. No line issues or signs/symptoms of bleeding per RN.  Goal of Therapy:  Heparin level 0.3-0.7 units/ml Monitor platelets by anticoagulation protocol: Yes   Plan:  Increase heparin to 1800 units/hr. Check 6 heparin level.  Daily CBC, heparin level. Monitor for signs/symptoms of bleeding.  Leinaala Catanese 07/16/2022,10:36 AM

## 2022-07-16 NOTE — Progress Notes (Signed)
PROGRESS NOTE                                                                                                                                                                                                             Patient Demographics:    Jose Lamb, is a 62 y.o. male, DOB - 12-Dec-1960, ZOX:[id number]  Outpatient Primary MD for the patient is [name], [name]    LOS - 1  Admit date - 07/15/2022    No chief complaint on file.      Brief Narrative (HPI from H&P)    62 y.o. male with medical history significant for prior CVA, paroxysmal atrial fibrillation not on oral anticoagulation, asthma, HIV with noncompliance, history of pulmonary embolism, who presents to [facility] ED with complaints of 6 weeks of left-sided weakness involving his left arm and left leg.  Associated with frequent falls.  Has not been taking his ART medications.  MRI of the brain showing right-sided T2 hypertense signal on the MRI primarily cortical/subcortical, in the right greater than left insula and postcentral gyrus, neurology and ID were consulted and he was admitted to the hospital for further care.  Patient had improved was transferred to CIR on 07/14/2022 subsequently at [facility] he developed some chest discomfort after which she was found to have a large PE on CTA and transferred back to [facility] acute care side.  Note patient has previous history of PE as well but had been off anticoagulation for several years, he was on heparin prophylactic dose while he was in the hospital.   Subjective:   Patient in bed, appears comfortable, denies any headache, no fever, no chest pain or pressure, no shortness of breath , no abdominal pain.  Left lower extremity weakness is improving.   Assessment  & Plan :    Acute PE and right lower extremity DVT.  History of PE in the past, was not on chronic anticoagulation anymore question compliance being the issue,  he is currently on heparin drip, hemodynamically stable and currently symptom-free, troponin negative, stable echocardiogram. Continue to monitor on heparin drip for another 24 hours, start Eliquis on 07/17/2022.   Left-sided weakness leg significantly more weak than the arm.  MRI of the brain suggestive of T2 hypertense signal primarily cortical/subcortical, in the right greater than left insula and postcentral gyrus,  neurology and ID on board, he was noncompliant with his HIV medications.  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  Continue supportive care with PT OT.  Left lower extremity weakness much improved since 07/12/2022, left upper extremity now minimally weak only.  Patient is deciding between going to CIR versus home health with PT, will wait for his decision.  Post discharge follow-up with ID and neurology in 1 month.   HIV - AIDs.  CD4 count less than 54 with CD4 helper T-cell percentage 5%.  Noncompliant with medications, counseled on compliance, ID on board defer management to ID.  He is now agreeable to taking his HIV medications as prescribed, see above.  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  Per ID requested ENT input, per ENT CT guided biopsy by IR if required or else outpatient ENT follow-up.  Discussed with Dr. Murlean Hark and ID physician Dr. Gwynn Burly.  He is s/p CT-guided biopsy of both parotid glands by IR on 07/10/2022, bilateral parotid gland FNAC done by IR appears unremarkable, prelim results noted on 07/12/2022, CSF autoimmune panel appears negative, CSF flow cytometry appears stable as well.  Dyslipidemia.  Placed on statin.  Asthma.  Stable no acute issues.  Obesity.  BMI 32.  Follow-up with PCP for weight  loss.  Blood culture 1 out of 2 specimens positive for Staph species.  Likely contamination.  Monitor.      Condition -   Guarded  Family Communication  : Updated wife on 07/11/2022  Code Status :  Full  Consults  :  ID, Neuro, ENT, IR  PUD Prophylaxis :    Procedures  :     CTA  1. Extensive bilateral pulmonary emboli with moderate-to-large thrombus volume and evidence of right heart strain. 2. Distended pulmonary trunk suggesting underlying pulmonary artery hypertension. 3. Patchy airspace disease in the lungs bilaterally, possible atelectasis or infiltrate. 4. Aneurysmal dilatation of the ascending aorta measuring 4.5 cm. Ascending thoracic aortic aneurysm. Recommend semi-annual imaging followup by CTA or MRA and referral to cardiothoracic surgery if not already obtained. This recommendation follows 2010 ACCF/AHA/AATS/ACR/ASA/SCA/SCAI/SIR/STS/SVM Guidelines for the Diagnosis and Management of Patients With Thoracic Aortic Disease. Circulation. 2010; 121: A540-J811. Aortic aneurysm NOS (ICD10-I71.9) 5. Cardiomegaly with scattered coronary artery calcifications  Leg [place] - R Leg acute DVT  TTE - 1. Left ventricular ejection fraction, by estimation, is 60 to 65%. The left ventricle has normal function. Left ventricular endocardial border not optimally defined to evaluate regional wall motion. There is mild left ventricular hypertrophy. Left ventricular diastolic parameters are indeterminate.  2. Right ventricular systolic function was not well visualized. The right ventricular size is not well visualized. There is normal pulmonary artery systolic pressure.  3. The mitral valve is normal in structure. No evidence of mitral valve regurgitation. No evidence of mitral stenosis.  4. The tricuspid valve is abnormal.  5. The aortic valve is tricuspid. Aortic valve regurgitation is not visualized. No aortic stenosis is present.  6. Aortic dilatation noted. There is mild dilatation of the aortic root,  measuring 40 mm. There is moderate dilatation of the ascending aorta, measuring 47 mm.  MRI - 1. Abnormal T2 hyperintense signal, primarily cortical/subcortical, in the right greater than left insula and postcentral gyrus,  as well as in the splenium and left body of the corpus callosum and possibly the right thalamus and midbrain. This is nonspecific, and while this could reflect sequela of prior infarcts, it is also concerning for an inflammatory or infectious process, such as PML, autoimmune encephalitis, or herpes simplex encephalitis, although this is not particularly typical of any the entities. MRI with contrast could be helpful. Correlate with symptoms and consider CSF testing. 2. Bilateral parotid masses are better seen on the prior CT and are incompletely imaged on this exam.      Disposition Plan  :      DVT Prophylaxis  :        Lab Results  Component Value Date   PLT 136 (L) 07/16/2022    Diet :  Diet Order             Diet Heart Room service appropriate? Yes with Assist; Fluid consistency: Thin  Diet effective now                    Inpatient Medications  Scheduled Meds:  bictegravir-emtricitabine-tenofovir AF  1 tablet Oral Daily   cyanocobalamin  1,000 mcg Oral Daily   rosuvastatin  10 mg Oral Daily   sodium chloride flush  3 mL Intravenous Q12H   sulfamethoxazole-trimethoprim  1 tablet Oral Daily   valACYclovir  1,000 mg Oral TID   Continuous Infusions:  heparin 1,600 Units/hr (07/16/22 0459)    PRN Meds:.acetaminophen **OR** acetaminophen, albuterol, bisacodyl, morphine injection, ondansetron **OR** ondansetron (ZOFRAN) IV, senna-docusate  Antibiotics  :    Anti-infectives (From admission, onward)    Start     Dose/Rate Route Frequency Ordered Stop   07/15/22 1000  bictegravir-emtricitabine-tenofovir AF (BIKTARVY) 50-200-25 MG per tablet 1 tablet        1 tablet Oral Daily 07/15/22 0048     07/15/22 1000  sulfamethoxazole-trimethoprim (BACTRIM  DS) 800-160 MG per tablet 1 tablet        1 tablet Oral Daily 07/15/22 0048     07/15/22 1000  valACYclovir (VALTREX) tablet 1,000 mg        1,000 mg Oral 3 times daily 07/15/22 0048           Objective:   Vitals:   07/16/22 0017 07/16/22 0411 07/16/22 0416 07/16/22 0824  BP: 116/75 136/81  126/84  Pulse: 87 88  62  Resp: 20 20  (!) 24  Temp: 97.9 F (36.6 C) 97.6 F (36.4 C)  98 F (36.7 C)  TempSrc: Oral Oral  Oral  SpO2:    98%  Weight:   96.9 kg     Wt Readings from Last 3 Encounters:  07/16/22 96.9 kg  07/14/22 95.3 kg  07/07/22 104.3 kg     Intake/Output Summary (Last 24 hours) at 07/16/2022 0920 Last data filed at 07/16/2022 0417 Gross per 24 hour  Intake --  Output 1300 ml  Net -1300 ml     Physical Exam  Awake Alert, No new F.N deficits, L leg 2/5, L arm mildly weak [place].AT,PERRAL Supple Neck, No JVD,   Symmetrical Chest wall movement, Good air movement bilaterally, CTAB RRR,No Gallops,Rubs or new Murmurs,  +ve B.Sounds, Abd Soft, No tenderness,   No Cyanosis, Clubbing or edema        Data Review:    Recent Labs  Lab 07/13/22 0709 07/14/22 0622 07/14/22 2055 07/15/22 0127 07/16/22 0426  WBC 3.9* 4.1 5.4 5.5 4.8  HGB 12.4* 12.3* 13.1 13.4 12.1*  HCT 37.2* 37.0* 39.4 40.0 35.9*  PLT 106* 110* 116* 114* 136*  MCV 87.1 88.1 86.8 87.5 87.8  MCH 29.0 29.3 28.9 29.3 29.6  MCHC 33.3 33.2 33.2 33.5 33.7  RDW 13.4 13.3 13.3 13.4 13.2  LYMPHSABS 1.3 1.1 1.1 1.1 0.8  MONOABS 0.3 0.3 0.5 0.4 0.5  EOSABS 0.2 0.1 0.0 0.1 0.1  BASOSABS 0.0 0.0 0.0 0.0 0.0    Recent Labs  Lab 07/10/22 0546 07/11/22 0216 07/12/22 0630 07/13/22 0709 07/14/22 0622 07/14/22 2055 07/15/22 0127 07/16/22 0426  NA 134* 132* 134* 137 135 136 135 137  K 3.8 3.8 3.6 3.7 3.5 3.6 3.6 3.6  CL 102 99 102 104 107 105 103 102  CO2 26 22 23 24  20* 18* 21* 20*  ANIONGAP 6 11 9 9 8 13 11 15   GLUCOSE 99 111* 119* 99 106* 118* 118* 101*  BUN 7 7 7 7 8 10 10 13   CREATININE 0.75  0.83 0.87 0.89 0.81 0.90 0.92 0.84  AST  --   --   --   --   --  14* 16 14*  ALT  --   --   --   --   --  16 18 14   ALKPHOS  --   --   --   --   --  62 65 57  BILITOT  --   --   --   --   --  0.8 0.8 0.7  ALBUMIN  --   --   --   --   --  3.3* 3.3* 2.9*  PROCALCITON  --   --   --   --   --  <0.10  --   --   LATICACIDVEN  --   --   --   --   --  0.9  --   --   INR 1.1  --   --   --   --   --   --   --   BNP  --  70.9 31.9 57.0 49.1  --   --  65.7  MG  --  2.0 2.3 2.1 2.1  --   --  2.1  CALCIUM 8.6* 8.6* 8.4* 8.5* 8.6* 8.8* 8.9 9.0      Recent Labs  Lab 07/10/22 0546 07/11/22 0216 07/12/22 0630 07/13/22 0709 07/14/22 0622 07/14/22 2055 07/15/22 0127 07/16/22 0426  PROCALCITON  --   --   --   --   --  <0.10  --   --   LATICACIDVEN  --   --   --   --   --  0.9  --   --   INR 1.1  --   --   --   --   --   --   --   BNP  --  70.9 31.9 57.0 49.1  --   --  65.7  MG  --  2.0 2.3 2.1 2.1  --   --  2.1  CALCIUM 8.6* 8.6* 8.4* 8.5* 8.6* 8.8* 8.9 9.0     Lab Results  Component Value Date   CHOL 197 04/10/2022   HDL 39 (L) 04/10/2022   LDLCALC 126 (H) 04/10/2022   TRIG 158 (H) 04/10/2022   CHOLHDL 5.1 04/10/2022    Lab Results  Component Value Date   HGBA1C 6.6 (H) 04/10/2022    Radiology Reports ECHOCARDIOGRAM COMPLETE  Result Date: 07/15/2022    ECHOCARDIOGRAM REPORT   Patient Name:  Jose Lamb Date of Exam: 07/15/2022 Medical Rec #:  [id number]      Height:       71.0 in Accession #:    [id number]     Weight:       209.7 lb Date of Birth:  11-05-1960      BSA:          2.151 m Patient Age:    62 years       BP:           135/88 mmHg Patient Gender: M              HR:           96 bpm. Exam Location:  Inpatient Procedure: 2D Echo, Cardiac Doppler and Color Doppler Indications:    Stroke  History:        Patient has no prior history of Echocardiogram examinations,                 most recent 11/27/2019. Arrythmias:Atrial Fibrillation; Risk                 Factors:Hypertension and  Diabetes.  Sonographer:    Lucy Antigua Referring Phys: [id number] VISHAL R PATEL  Sonographer Comments: Suboptimal apical window and no subcostal window. Image acquisition challenging due to patient body habitus. Patient complained of subcostal/chest pain. He was not able to depict exactly where the pain was. Wanted to end exam. IMPRESSIONS  1. Left ventricular ejection fraction, by estimation, is 60 to 65%. The left ventricle has normal function. Left ventricular endocardial border not optimally defined to evaluate regional wall motion. There is mild left ventricular hypertrophy. Left ventricular diastolic parameters are indeterminate.  2. Right ventricular systolic function was not well visualized. The right ventricular size is not well visualized. There is normal pulmonary artery systolic pressure.  3. The mitral valve is normal in structure. No evidence of mitral valve regurgitation. No evidence of mitral stenosis.  4. The tricuspid valve is abnormal.  5. The aortic valve is tricuspid. Aortic valve regurgitation is not visualized. No aortic stenosis is present.  6. Aortic dilatation noted. There is mild dilatation of the aortic root, measuring 40 mm. There is moderate dilatation of the ascending aorta, measuring 47 mm. FINDINGS  Left Ventricle: Left ventricular ejection fraction, by estimation, is 60 to 65%. The left ventricle has normal function. Left ventricular endocardial border not optimally defined to evaluate regional wall motion. The left ventricular internal cavity size was normal in size. There is mild left ventricular hypertrophy. Left ventricular diastolic parameters are indeterminate. Right Ventricle: The right ventricular size is not well visualized. Right vetricular wall thickness was not well visualized. Right ventricular systolic function was not well visualized. There is normal pulmonary artery systolic pressure. The tricuspid regurgitant velocity is 2.69 m/s, and with an assumed right atrial  pressure of 3 mmHg, the estimated right ventricular systolic pressure is 31.9 mmHg. Left Atrium: Left atrial size was normal in size. Right Atrium: Right atrial size was normal in size. Pericardium: The pericardium was not well visualized. Mitral Valve: The mitral valve is normal in structure. No evidence of mitral valve regurgitation. No evidence of mitral valve stenosis. Tricuspid Valve: The tricuspid valve is abnormal. Tricuspid valve regurgitation is mild . No evidence of tricuspid stenosis. Aortic Valve: The aortic valve is tricuspid. Aortic valve regurgitation is not visualized. No aortic stenosis is present. Aortic valve mean gradient measures 4.0 mmHg. Aortic valve peak gradient measures 7.2 mmHg. Aortic valve  area, by VTI measures 3.15 cm. Pulmonic Valve: The pulmonic valve was not well visualized. Pulmonic valve regurgitation is trivial. No evidence of pulmonic stenosis. Aorta: Aortic dilatation noted. There is mild dilatation of the aortic root, measuring 40 mm. There is moderate dilatation of the ascending aorta, measuring 47 mm. IAS/Shunts: The interatrial septum was not well visualized.  LEFT VENTRICLE PLAX 2D LVIDd:         3.15 cm   Diastology LVIDs:         2.60 cm   LV e' medial:  10.52 cm/s LV PW:         1.25 cm   LV e' lateral: 8.70 cm/s LV IVS:        1.15 cm LVOT diam:     2.20 cm LV SV:         67 LV SV Index:   31 LVOT Area:     3.80 cm  RIGHT VENTRICLE RV S prime:     20.50 cm/s TAPSE (M-mode): 2.5 cm LEFT ATRIUM             Index        RIGHT ATRIUM           Index LA Vol (A2C):   82.8 ml 38.49 ml/m  RA Area:     20.50 cm LA Vol (A4C):   52.6 ml 24.45 ml/m  RA Volume:   54.50 ml  25.33 ml/m LA Biplane Vol: 68.5 ml 31.84 ml/m  AORTIC VALVE AV Area (Vmax):    2.92 cm AV Area (Vmean):   2.51 cm AV Area (VTI):     3.15 cm AV Vmax:           134.00 cm/s AV Vmean:          99.200 cm/s AV VTI:            0.211 m AV Peak Grad:      7.2 mmHg AV Mean Grad:      4.0 mmHg LVOT Vmax:          103.00 cm/s LVOT Vmean:        65.500 cm/s LVOT VTI:          0.175 m LVOT/AV VTI ratio: 0.83  AORTA Ao Root diam: 4.00 cm Ao Asc diam:  4.70 cm TRICUSPID VALVE TR Peak grad:   28.9 mmHg TR Vmax:        269.00 cm/s  SHUNTS Systemic VTI:  0.18 m Systemic Diam: 2.20 cm Dina Rich MD Electronically signed by Dina Rich MD Signature Date/Time: 07/15/2022/12:43:16 PM    Final    VAS [place] LOWER EXTREMITY VENOUS (DVT)  Result Date: 07/15/2022  Lower Venous DVT Study Patient Name:  Jose Lamb  Date of Exam:   07/15/2022 Medical Rec #: [id number]       Accession #:    [id number] Date of Birth: 06/29/1960       Patient Gender: M Patient Age:   87 years Exam Location:  [facility] Procedure:      VAS [place] LOWER EXTREMITY VENOUS (DVT) Referring Phys: Eston Esters PATEL --------------------------------------------------------------------------------  Indications: Pulmonary embolism.  Anticoagulation: Heparin. Limitations: Poor ultrasound/tissue interface and body habitus. Comparison Study: No previous study. Performing Technologist: McKayla Maag RVT, [place]  Examination Guidelines: A complete evaluation includes B-mode imaging, spectral Doppler, color Doppler, and power Doppler as needed of all accessible portions of each vessel. Bilateral testing is considered an integral part of a complete examination. Limited examinations for reoccurring indications  may be performed as noted. The reflux portion of the exam is performed with the patient in reverse Trendelenburg.  +---------+---------------+---------+-----------+----------+--------------+ RIGHT    CompressibilityPhasicitySpontaneityPropertiesThrombus Aging +---------+---------------+---------+-----------+----------+--------------+ CFV      Full           Yes      Yes                                 +---------+---------------+---------+-----------+----------+--------------+ SFJ      Full                                                         +---------+---------------+---------+-----------+----------+--------------+ FV Prox  Full                                                        +---------+---------------+---------+-----------+----------+--------------+ FV Mid   Full                                                        +---------+---------------+---------+-----------+----------+--------------+ FV DistalFull                                                        +---------+---------------+---------+-----------+----------+--------------+ PFV      Full                                                        +---------+---------------+---------+-----------+----------+--------------+ POP      Full           Yes      Yes                                 +---------+---------------+---------+-----------+----------+--------------+ PTV      Full                                                        +---------+---------------+---------+-----------+----------+--------------+ PERO                                                  Not visualized +---------+---------------+---------+-----------+----------+--------------+ Gastroc  None           No       No  Acute          +---------+---------------+---------+-----------+----------+--------------+   +---------+---------------+---------+-----------+----------+--------------+ LEFT     CompressibilityPhasicitySpontaneityPropertiesThrombus Aging +---------+---------------+---------+-----------+----------+--------------+ CFV      Full           Yes      Yes                                 +---------+---------------+---------+-----------+----------+--------------+ SFJ      Full                                                        +---------+---------------+---------+-----------+----------+--------------+ FV Prox  Full                                                         +---------+---------------+---------+-----------+----------+--------------+ FV Mid   Full                                                        +---------+---------------+---------+-----------+----------+--------------+ FV DistalFull                                                        +---------+---------------+---------+-----------+----------+--------------+ PFV      Full                                                        +---------+---------------+---------+-----------+----------+--------------+ POP      Full           Yes      Yes                                 +---------+---------------+---------+-----------+----------+--------------+ PTV      Full                                                        +---------+---------------+---------+-----------+----------+--------------+ PERO                                                  Not visualized +---------+---------------+---------+-----------+----------+--------------+     Summary: RIGHT: - Findings consistent with acute deep vein thrombosis involving the right gastrocnemius veins. - Portions of this examination were limited- see technologist comments above. - No cystic structure found in the popliteal fossa.  LEFT: -  There is no evidence of deep vein thrombosis in the lower extremity. However, portions of this examination were limited- see technologist comments above.  - No cystic structure found in the popliteal fossa.  *See table(s) above for measurements and observations. Electronically signed by Gerarda Fraction on 07/15/2022 at 9:39:10 AM.    Final    CT Angio Chest Pulmonary Embolism (PE) W or WO Contrast  Addendum Date: 07/14/2022   ADDENDUM REPORT: 07/14/2022 23:07 ADDENDUM: Critical Value/emergent results were called by telephone at the time of interpretation on 07/14/2022 at 11:07 pm to provider VISHAL PATEL , who verbally acknowledged these results. Electronically Signed   By: Thornell Sartorius M.D.   On:  07/14/2022 23:07   Result Date: 07/14/2022 CLINICAL DATA:  Pulmonary embolism suspected, high probability. EXAM: CT ANGIOGRAPHY CHEST WITH CONTRAST TECHNIQUE: Multidetector CT imaging of the chest was performed using the standard protocol during bolus administration of intravenous contrast. Multiplanar CT image reconstructions and MIPs were obtained to evaluate the vascular anatomy. RADIATION DOSE REDUCTION: This exam was performed according to the departmental dose-optimization program which includes automated exposure control, adjustment of the mA and/or kV according to patient size and/or use of iterative reconstruction technique. CONTRAST:  75mL OMNIPAQUE IOHEXOL 350 MG/ML SOLN COMPARISON:  04/06/2022. FINDINGS: Cardiovascular: Heart is mildly enlarged and there is a trace pericardial effusion. Scattered coronary artery calcifications are present. There is aneurysmal dilatation of the ascending aorta measuring 4.5 cm. The pulmonary trunk is distended suggesting underlying pulmonary artery hypertension. There are bilateral pulmonary emboli involving lobar, segmental, and subsegmental arteries with moderate-to-large thrombus burden. The right ventricle is distended suggesting underlying pulmonary artery hypertension. Mediastinum/Nodes: No mediastinal, hilar, or axillary lymphadenopathy by size criteria. The thyroid gland, trachea, and esophagus are within normal limits. Lungs/Pleura: Patchy airspace disease is noted bilaterally and most pronounced in the lower lobes. No effusion or pneumothorax. Upper Abdomen: No acute abnormality. Musculoskeletal: Old rib fractures are noted on the left. There are degenerative changes in the thoracic spine. No acute fracture. Review of the MIP images confirms the above findings. IMPRESSION: 1. Extensive bilateral pulmonary emboli with moderate-to-large thrombus volume and evidence of right heart strain. 2. Distended pulmonary trunk suggesting underlying pulmonary artery  hypertension. 3. Patchy airspace disease in the lungs bilaterally, possible atelectasis or infiltrate. 4. Aneurysmal dilatation of the ascending aorta measuring 4.5 cm. Ascending thoracic aortic aneurysm. Recommend semi-annual imaging followup by CTA or MRA and referral to cardiothoracic surgery if not already obtained. This recommendation follows 2010 ACCF/AHA/AATS/ACR/ASA/SCA/SCAI/SIR/STS/SVM Guidelines for the Diagnosis and Management of Patients With Thoracic Aortic Disease. Circulation. 2010; 121: Z610-R604. Aortic aneurysm NOS (ICD10-I71.9) 5. Cardiomegaly with scattered coronary artery calcifications. Electronically Signed: By: Thornell Sartorius M.D. On: 07/14/2022 23:02   DG CHEST PORT 1 VIEW  Result Date: 07/14/2022 CLINICAL DATA:  Tachypnea EXAM: PORTABLE CHEST 1 VIEW COMPARISON:  07/05/2022 FINDINGS: Lung volumes are small and there is bibasilar atelectasis. No superimposed confluent pulmonary infiltrate. No pneumothorax or pleural effusion. Cardiac size within normal limits. Pulmonary vascularity is normal. No acute bone abnormality. IMPRESSION: 1. Pulmonary hypoinflation. Electronically Signed   By: Helyn Numbers M.D.   On: 07/14/2022 20:45   EEG adult  Result Date: 07/13/2022 Charlsie Quest, MD     07/13/2022 10:05 PM Patient Name: Jose Lamb MRN: [id number] Epilepsy Attending: Charlsie Quest Referring Physician/Provider: Leroy Sea, MD Date: 07/13/2022 Duration: 23.28 mins Patient history:  62 year old male with a 6-week history of increasing left-sided weakness and hemisensory deficits getting eeg to evaluate  for seizure. Level of alertness: Awake, asleep AEDs during EEG study: None Technical aspects: This EEG study was done with scalp electrodes positioned according to the 10-20 International system of electrode placement. Electrical activity was reviewed with band pass filter of 1-70Hz , sensitivity of 7 uV/mm, display speed of 78mm/sec with a 60Hz  notched filter applied as  appropriate. EEG data were recorded continuously and digitally stored.  Video monitoring was available and reviewed as appropriate. Description: The posterior dominant rhythm consists of 9 Hz activity of moderate voltage (25-35 uV) seen predominantly in posterior head regions, symmetric and reactive to eye opening and eye closing. Sleep was characterized by vertex waves, sleep spindles (12 to 14 Hz), maximal frontocentral region. Physiologic photic driving was seen during photic stimulation.  Hyperventilation was not performed.   IMPRESSION: This study is within normal limits. No seizures or epileptiform discharges were seen throughout the recording. A normal interictal EEG does not exclude the diagnosis of epilepsy. Charlsie Quest      Signature  -   Susa Raring M.D on 07/16/2022 at 9:20 AM   -  To page go to [email or web address]

## 2022-07-17 DIAGNOSIS — I2699 Other pulmonary embolism without acute cor pulmonale: Secondary | ICD-10-CM | POA: Diagnosis not present

## 2022-07-17 LAB — COMPREHENSIVE METABOLIC PANEL
ALT: 12 U/L (ref 0–44)
AST: 14 U/L — ABNORMAL LOW (ref 15–41)
Albumin: 2.9 g/dL — ABNORMAL LOW (ref 3.5–5.0)
Alkaline Phosphatase: 60 U/L (ref 38–126)
Anion gap: 10 (ref 5–15)
BUN: 12 mg/dL (ref 6–20)
CO2: 24 mmol/L (ref 22–32)
Calcium: 9 mg/dL (ref 8.9–10.3)
Chloride: 103 mmol/L (ref 98–111)
Creatinine, Ser: 0.99 mg/dL (ref 0.61–1.24)
GFR, Estimated: >60 mL/min (ref >60)
Glucose, Bld: 97 mg/dL (ref 70–99)
Potassium: 3.6 mmol/L (ref 3.5–5.1)
Sodium: 137 mmol/L (ref 135–145)
Total Bilirubin: 0.2 mg/dL — ABNORMAL LOW (ref 0.3–1.2)
Total Protein: 7.5 g/dL (ref 6.5–8.1)

## 2022-07-17 LAB — CBC WITH DIFFERENTIAL/PLATELET
Abs Immature Granulocytes: 0.02 10*3/uL (ref 0.00–0.07)
Basophils Absolute: 0 10*3/uL (ref 0.0–0.1)
Basophils Relative: 1 %
Eosinophils Absolute: 0.2 10*3/uL (ref 0.0–0.5)
Eosinophils Relative: 6 %
HCT: 36.7 % — ABNORMAL LOW (ref 39.0–52.0)
Hemoglobin: 12 g/dL — ABNORMAL LOW (ref 13.0–17.0)
Immature Granulocytes: 1 %
Lymphocytes Relative: 23 %
Lymphs Abs: 0.9 10*3/uL (ref 0.7–4.0)
MCH: 28.7 pg (ref 26.0–34.0)
MCHC: 32.7 g/dL (ref 30.0–36.0)
MCV: 87.8 fL (ref 80.0–100.0)
Monocytes Absolute: 0.4 10*3/uL (ref 0.1–1.0)
Monocytes Relative: 10 %
Neutro Abs: 2.2 10*3/uL (ref 1.7–7.7)
Neutrophils Relative %: 59 %
Platelets: 169 10*3/uL (ref 150–400)
RBC: 4.18 MIL/uL — ABNORMAL LOW (ref 4.22–5.81)
RDW: 13.2 % (ref 11.5–15.5)
WBC: 3.7 10*3/uL — ABNORMAL LOW (ref 4.0–10.5)
nRBC: 0 % (ref 0.0–0.2)

## 2022-07-17 LAB — HEPARIN LEVEL (UNFRACTIONATED): Heparin Unfractionated: 0.39 IU/mL (ref 0.30–0.70)

## 2022-07-17 LAB — BRAIN NATRIURETIC PEPTIDE: B Natriuretic Peptide: 35.1 pg/mL (ref 0.0–100.0)

## 2022-07-17 LAB — JC VIRUS, PCR CSF: JC Virus PCR, CSF: POSITIVE — AB

## 2022-07-17 LAB — MAGNESIUM: Magnesium: 2.1 mg/dL (ref 1.7–2.4)

## 2022-07-17 LAB — CULTURE, BLOOD (ROUTINE X 2)

## 2022-07-17 MED ORDER — APIXABAN 5 MG PO TABS: 5.0000 mg | ORAL_TABLET | Freq: Two times a day (BID) | ORAL | Status: DC

## 2022-07-17 MED ORDER — APIXABAN 5 MG PO TABS
10.0000 mg | ORAL_TABLET | Freq: Two times a day (BID) | ORAL | Status: DC
  Administered 2022-07-17 – 2022-07-19 (×5): 10 mg via ORAL
  Filled 2022-07-17 (×5): qty 2

## 2022-07-17 NOTE — Plan of Care (Signed)
  Problem: Education: Goal: Knowledge of General Education information will improve Description: Including pain rating scale, medication(s)/side effects and non-pharmacologic comfort measures Outcome: Progressing   Problem: Nutrition: Goal: Adequate nutrition will be maintained Outcome: Progressing   Problem: Coping: Goal: Level of anxiety will decrease Outcome: Progressing   Problem: Elimination: Goal: Will not experience complications related to urinary retention Outcome: Progressing   Problem: Pain Managment: Goal: General experience of comfort will improve Outcome: Progressing   Problem: Safety: Goal: Ability to remain free from injury will improve Outcome: Progressing   Problem: Health Behavior/Discharge Planning: Goal: Ability to manage health-related needs will improve Outcome: Not Progressing   Problem: Activity: Goal: Risk for activity intolerance will decrease Outcome: Not Progressing

## 2022-07-17 NOTE — Progress Notes (Signed)
Inpatient Rehabilitation  Patient information reviewed and entered into eRehab system by Jeneen Doutt M. Yerlin Gasparyan, M.A., CCC/SLP, PPS Coordinator.  Information including medical coding, functional ability and quality indicators will be reviewed and updated through discharge.    

## 2022-07-17 NOTE — Plan of Care (Signed)
  Problem: Education: Goal: Knowledge of General Education information will improve Description: Including pain rating scale, medication(s)/side effects and non-pharmacologic comfort measures Outcome: Progressing   Problem: Clinical Measurements: Goal: Respiratory complications will improve Outcome: Progressing   Problem: Nutrition: Goal: Adequate nutrition will be maintained Outcome: Progressing   Problem: Coping: Goal: Level of anxiety will decrease Outcome: Progressing   Problem: Elimination: Goal: Will not experience complications related to urinary retention Outcome: Progressing   Problem: Pain Managment: Goal: General experience of comfort will improve Outcome: Progressing   Problem: Health Behavior/Discharge Planning: Goal: Ability to manage health-related needs will improve Outcome: Not Progressing

## 2022-07-17 NOTE — Discharge Summary (Signed)
Physician Discharge Summary  Patient ID: Jose Lamb MRN: [id number] DOB/AGE: 1960-05-22 62 y.o.  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 HIV/AIDS Anxiety Hyperlipidemia Anemia Asthma HSV-2 DM-2 Obesity Fever   Discharged Condition: serious  Significant Diagnostic Studies:  Labs:  Basic Metabolic Panel: Recent Labs  Lab 07/11/22 0216 07/12/22 0630 07/13/22 0709 07/14/22 0622 07/14/22 2055 07/15/22 0127 07/16/22 0426 07/17/22 0453  NA 132* 134* 137 135 136 135 137 137  K 3.8 3.6 3.7 3.5 3.6 3.6 3.6 3.6  CL 99 102 104 107 105 103 102 103  CO2 22 23 24  20* 18* 21* 20* 24  GLUCOSE 111* 119* 99 106* 118* 118* 101* 97  BUN 7 7 7 8 10 10 13 12   CREATININE 0.83 0.87 0.89 0.81 0.90 0.92 0.84 0.99  CALCIUM 8.6* 8.4* 8.5* 8.6* 8.8* 8.9 9.0 9.0  MG 2.0 2.3 2.1 2.1  --   --  2.1 2.1    CBC: Recent Labs  Lab 07/15/22 0127 07/16/22 0426 07/17/22 0453  WBC 5.5 4.8 3.7*  NEUTROABS 3.9 3.3 2.2  HGB 13.4 12.1* 12.0*  HCT 40.0 35.9* 36.7*  MCV 87.5 87.8 87.8  PLT 114* 136* 169    CBG: Recent Labs  Lab 07/13/22 1344  GLUCAP 115*    Brief HPI:   Jose Lamb is a 62 y.o. male presented to [facility] ED on 07/05/2022 reporting a history of multiple falls and a recent stroke.  Chief complaints were allover soreness and weakness.  Workup was significant for possible urinary tract infection and hypokalemia.  He was placed on antibiotics and given potassium supplement and discharged home.  He re-presented to the emergency department at [facility] on 07/07/2022 complaining of an approximately 6-week history of left-sided weakness.  Notes indicate the patient was sent there for MRI scanning.  Neurology was consulted and MRI of the brain revealed abnormal T2 signal of the cortical subcortical and right greater than left insular postcentral gyrus as well as splenium and body of corpus callosum and thalamus  and midbrain.  This was concerning for infectious versus inflammatory process such as PML, autoimmune encephalitis, herpes simplex and enteritis.  Recommendations were for high-volume LP with labs, MRI of the brain with contrast and admission to [facility].  The patient's pertinent past medical history includes HIV, hypertension and diabetes mellitus.  The patient's history of HIV disease is longstanding and he has not been on ART for several years going back to 2017.  His most recent regimen was Descovy, darnunavir/ritonavir and etravirine.  Prior history of bilateral pulmonary embolism 2021, atrial fibrillation in the setting of hyperthyroidism.  The patient is not chronically anticoagulated.   Infectious disease consultation obtained by Dr. Earlene Plater on 6/01.  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  Given high-dose B12 daily. Imaging was also significant for bilateral parotid masses and ENT consultation placed for biopsy.  Ultrasound-guided aspiration of bilateral parotid cyst on 6/03 by Dr. Grace Isaac.  Negative for malignancy. Prior to approximately 6 weeks ago patient was independent without a device and working.  Over the last 6 weeks he required increasing assistance from his spouse for mobility and basic self-care activities.  He will discharge home with his spouse  to a multilevel home with 2 steps to enter.  He can stay on the first level which has a bedroom and bathroom.    Hospital Course: Jose Lamb was admitted to rehab 07/14/2022 for inpatient therapies to consist of PT, ST and OT at least three hours five days a week. Past admission physiatrist, therapy team and rehab RN have worked together to provide customized collaborative inpatient rehab. After arrival to 4W, the patient's vital  signs were taken and he had fever to 101.4, elevated BP and tachycardia. Rapid response was called. Tylenol given and pan cultures obtained and TRH consulted. CTA chest shows extensive bilateral PE with moderate to large thrombus volume and evidence of right heart strain.  Will transfer patient to progressive bed under hospitalist service.  Start on IV heparin.   Disposition: transfer to acute care bed There are no questions and answers to display.       Allergies as of 07/15/2022       Reactions   Asa [aspirin] Other (See Comments)   Epistaxis        Medication List     ASK your doctor about these medications    albuterol 108 (90 Base) MCG/ACT inhaler Commonly known as: VENTOLIN HFA 1 puff every 4 (four) hours as needed for wheezing or shortness of breath.   albuterol (2.5 MG/3ML) 0.083% nebulizer solution Commonly known as: PROVENTIL Take 2.5 mg by nebulization every 4 (four) hours.   ascorbic acid 500 MG tablet Commonly known as: VITAMIN C Take 1 tablet (500 mg total) by mouth daily.   Biktarvy 50-200-25 MG Tabs tablet Generic drug: bictegravir-emtricitabine-tenofovir AF Take 1 tablet by mouth daily.   cyanocobalamin 1000 MCG tablet Commonly known as: VITAMIN B12 Take 1 tablet (1,000 mcg total) by mouth daily.   oxyCODONE-acetaminophen 10-325 MG tablet Commonly known as: PERCOCET Take 1 tablet by mouth every 6 (six) hours.   rosuvastatin 10 MG tablet Commonly known as: CRESTOR Take 1 tablet (10 mg total) by mouth daily.   sulfamethoxazole-trimethoprim 800-160 MG tablet Commonly known as: BACTRIM DS Take 1 tablet by mouth daily.   valACYclovir 1000 MG tablet Commonly known as: Valtrex Take 1 tablet (1,000 mg total) by mouth 3 (three) times daily for 9 days.         Signed: Lynnell Jude Traevon Lamb 07/17/2022, 1:19 PM

## 2022-07-17 NOTE — Evaluation (Addendum)
Physical Therapy Evaluation Patient Details Name: Jose Lamb MRN: [id number] DOB: 08-Dec-1960 Today's Date: 07/17/2022  History of Present Illness  Vedh Brito is a 62 y.o. male who presents to [facility] hospital on 07/08/2022 as a transfer from [facility] with 6 weeks of left-sided weakness and frequent falls. MRI brain revealed concern for inflammatory or infectious process. Pt discharged to [facility] on 6/7, found to have bilateral PE same date and was readmitted. PMHx: CVA, paroxysmal atrial fibrillation not on oral anticoagulation, asthma, HIV with noncompliance, history of pulmonary embolism.  Clinical Impression  Pt presents to PT with deficits in functional mobility, strength, balance, awareness, sensation. Pt with L weakness and sensation impairment, leaning to left side in sitting and standing. Pt's LLE quickly begins to flex due to weakness and poor neuromuscular endurance, resulting in a L lateral lean in standing. Pt requires significant assistance for all functional mobility and remains at a high risk for falls. PT recommends a return to high intensity inpatient PT services once medically appropriate.       Recommendations for follow up therapy are one component of a multi-disciplinary discharge planning process, led by the attending physician.  Recommendations may be updated based on patient status, additional functional criteria and insurance authorization.  Follow Up Recommendations       Assistance Recommended at Discharge Frequent or constant Supervision/Assistance  Patient can return home with the following  Two people to help with walking and/or transfers;A lot of help with bathing/dressing/bathroom;Assistance with cooking/housework;Assist for transportation;Help with stairs or ramp for entrance;Direct supervision/assist for medications management;Direct supervision/assist for financial management    Equipment Recommendations Rolling walker (2 wheels);BSC/3in1;Wheelchair  (measurements PT);Wheelchair cushion (measurements PT);Hospital bed  Recommendations for Other Services  Rehab consult    Functional Status Assessment Patient has had a recent decline in their functional status and demonstrates the ability to make significant improvements in function in a reasonable and predictable amount of time.     Precautions / Restrictions Precautions Precautions: Fall Restrictions Weight Bearing Restrictions: No      Mobility  Bed Mobility Overal bed mobility: Needs Assistance Bed Mobility: Supine to Sit, Sit to Supine     Supine to sit: Mod assist, HOB elevated Sit to supine: Min assist, HOB elevated        Transfers Overall transfer level: Needs assistance Equipment used: Rolling walker (2 wheels) Transfers: Sit to/from Stand Sit to Stand: Mod assist, From elevated surface           General transfer comment: pt requiring significant cueing and physical assist to widen stance for setup, verbal cues for hand placement, left lateral lean    Ambulation/Gait             Pre-gait activities: pt marches in place for 6 steps, reduced clearance of RLE due to impaired ability to maintain LLE extension with weight shift to left    Stairs            Wheelchair Mobility    Modified Rankin (Stroke Patients Only) Modified Rankin (Stroke Patients Only) Pre-Morbid Rankin Score: No symptoms Modified Rankin: Moderately severe disability     Balance Overall balance assessment: Needs assistance Sitting-balance support: Single extremity supported, Feet supported Sitting balance-Leahy Scale: Poor Sitting balance - Comments: minA, left lean Postural control: Left lateral lean Standing balance support: Bilateral upper extremity supported Standing balance-Leahy Scale: Poor Standing balance comment: modA  Pertinent Vitals/Pain Pain Assessment Pain Assessment: Faces Faces Pain Scale: Hurts little  more Pain Location: buttocks Pain Descriptors / Indicators: Sore Pain Intervention(s): Monitored during session    Home Living Family/patient expects to be discharged to:: Private residence Living Arrangements: Spouse/significant other;Children;Other relatives Available Help at Discharge: Family Type of Home: House Home Access: Stairs to enter Entrance Stairs-Rails: None Entrance Stairs-Number of Steps: 2   Home Layout: Multi-level;Laundry or work area in basement;Full bath on main level;Able to live on main level with bedroom/bathroom Home Equipment: Wheelchair - power;Cane - single point      Prior Function Prior Level of Function : Independent/Modified Independent;Needs assist;Driving;Working/employed;History of Falls (last six months)             Mobility Comments: prior to ~7 weeks ago pt was independent with no device and working full time driving/operating large equipment. Over last 7 weeks pt has had worsening weakness in Lt side and decreased sensation, pt reports at least 8 falls. He has a SPC he has been using for some ambulation in home and power WC. ADLs Comments: prior to ~7 weeks ago pt was independent with no AE required. Over last 7 weeks pt has required assits for transfers on/off toilet from spouse, is no longer showering due to fall in bathtub and is having assist from spouse for sink/sponge baths.     Hand Dominance   Dominant Hand: Right    Extremity/Trunk Assessment   Upper Extremity Assessment [name] Deficits / Details: grossly 3+/5, although inattentive to [name] often resulting in L arm hanging at side [name] Sensation: decreased light touch    Lower Extremity Assessment Lower Extremity Assessment: LLE deficits/detail LLE Deficits / Details: grossly 3+/5 LLE Sensation: decreased light touch    Cervical / Trunk Assessment Cervical / Trunk Assessment: Normal  Communication   Communication: No difficulties  Cognition Arousal/Alertness:  Awake/alert Behavior During Therapy: Flat affect Overall Cognitive Status: Impaired/Different from baseline Area of Impairment: Attention, Memory, Following commands, Safety/judgement, Awareness, Problem solving                   Current Attention Level: Selective Memory: Decreased recall of precautions, Decreased short-term memory Following Commands: Follows one step commands with increased time Safety/Judgement: Decreased awareness of safety, Decreased awareness of deficits Awareness: Emergent Problem Solving: Slow processing, Difficulty sequencing, Requires verbal cues          General Comments General comments (skin integrity, edema, etc.): VSS, pt on 3L [place]    Exercises     Assessment/Plan    PT Assessment Patient needs continued PT services  PT Problem List Decreased strength;Decreased range of motion;Decreased activity tolerance;Decreased balance;Decreased mobility;Decreased coordination;Decreased cognition;Decreased knowledge of use of DME;Decreased safety awareness;Impaired sensation;Obesity;Decreased knowledge of precautions       PT Treatment Interventions DME instruction;Patient/family education;Cognitive remediation;Neuromuscular re-education;Balance training;Therapeutic exercise;Therapeutic activities;Functional mobility training;Stair training;Gait training;Wheelchair mobility training    PT Goals (Current goals can be found in the Care Plan section)  Acute Rehab PT Goals Patient Stated Goal: to improve standing balance and reduce falls risk PT Goal Formulation: With patient Time For Goal Achievement: 07/31/22 Potential to Achieve Goals: Fair    Frequency Min 4X/week     Co-evaluation               AM-PAC PT "6 Clicks" Mobility  Outcome Measure Help needed turning from your back to your side while in a flat bed without using bedrails?: A Little Help needed moving from lying on your back to sitting  on the side of a flat bed without using  bedrails?: A Lot Help needed moving to and from a bed to a chair (including a wheelchair)?: A Lot Help needed standing up from a chair using your arms (e.g., wheelchair or bedside chair)?: A Lot Help needed to walk in hospital room?: Total Help needed climbing 3-5 steps with a railing? : Total 6 Click Score: 11    End of Session Equipment Utilized During Treatment: Gait belt Activity Tolerance: Patient tolerated treatment well Patient left: in bed;with call bell/phone within reach;with bed alarm set Nurse Communication: Need for lift equipment;Mobility status PT Visit Diagnosis: Other abnormalities of gait and mobility (R26.89);Muscle weakness (generalized) (M62.81);Hemiplegia and hemiparesis Hemiplegia - Right/Left: Left Hemiplegia - dominant/non-dominant: Non-dominant Hemiplegia - caused by: Unspecified    Time: 1610-9604 PT Time Calculation (min) (ACUTE ONLY): 27 min   Charges:   PT Evaluation $PT Eval Moderate Complexity: 1 Mod PT Treatments $Therapeutic Activity: 8-22 mins        Arlyss Gandy, PT, DPT Acute Rehabilitation Office [phone]   Arlyss Gandy 07/17/2022, 5:29 PM

## 2022-07-17 NOTE — TOC Transition Note (Signed)
Transition of Care [facility]) - CM/SW Discharge Note   Patient Details  Name: Jose Lamb MRN: [id number] Date of Birth: [birth year]  Transition of [facility]) CM/SW Contact:  Gordy Clement, RN Phone Number: 07/17/2022, 12:28 PM   Clinical Narrative:     CM acknowledges "Consult to Case Management for Medication assistance.  A 30 day Eliquis coupon was given to patient per Physician request. TOC will continue to follow patient for any additional discharge needs             Patient Goals and CMS Choice      Discharge Placement                         Discharge Plan and Services Additional resources added to the After Visit Summary for                                       Social Determinants of Health (SDOH) Interventions SDOH Screenings   Food Insecurity: No Food Insecurity (07/08/2022)  Housing: Low Risk  (07/08/2022)  Transportation Needs: No Transportation Needs (07/08/2022)  Utilities: Not At Risk (07/08/2022)  Tobacco Use: Low Risk  (07/14/2022)     Readmission Risk Interventions     No data to display

## 2022-07-17 NOTE — Progress Notes (Signed)
Inpatient Rehab Admissions Coordinator:   Note events of the weekend with transfer back to acute.  Pt stabilized.  Will need PT/OT evaluations to begin insurance auth request for return to CIR.  Will f/u with pt/spouse after they are able to work with him.  Estill Dooms, PT, DPT Admissions Coordinator [phone] 07/17/22  10:40 AM

## 2022-07-17 NOTE — Progress Notes (Signed)
PROGRESS NOTE                                                                                                                                                                                                             Patient Demographics:    Jose Lamb, is a 62 y.o. male, DOB - 07-03-1960, ZOX:[id number]  Outpatient Primary MD for the patient is [name], [name]    LOS - 2  Admit date - 07/15/2022    No chief complaint on file.      Brief Narrative (HPI from H&P)    62 y.o. male with medical history significant for prior CVA, paroxysmal atrial fibrillation not on oral anticoagulation, asthma, HIV with noncompliance, history of pulmonary embolism, who presents to [facility] ED with complaints of 6 weeks of left-sided weakness involving his left arm and left leg.  Associated with frequent falls.  Has not been taking his ART medications.  MRI of the brain showing right-sided T2 hypertense signal on the MRI primarily cortical/subcortical, in the right greater than left insula and postcentral gyrus, neurology and ID were consulted and he was admitted to the hospital for further care.  Patient had improved was transferred to CIR on 07/14/2022 subsequently at [facility] he developed some chest discomfort after which she was found to have a large PE on CTA and transferred back to [facility] acute care side.  Note patient has previous history of PE as well but had been off anticoagulation for several years, he was on heparin prophylactic dose while he was in the hospital.   Subjective:   Patient in bed, appears comfortable, denies any headache, no fever, no chest pain or pressure, no shortness of breath , no abdominal pain. No focal weakness.   Assessment  & Plan :    Acute PE and right lower extremity DVT.  History of PE in the past, was not on chronic anticoagulation anymore question compliance being the issue, he is currently on heparin  drip, hemodynamically stable and currently symptom-free, troponin negative, stable echocardiogram. Continue to monitor on heparin drip for another 24 hours, start Eliquis on 07/17/2022.   Left-sided weakness leg significantly more weak than the arm.  MRI of the brain suggestive of T2 hypertense signal primarily cortical/subcortical, in the right greater than left insula and postcentral gyrus, neurology and ID on  board, he was noncompliant with his HIV medications.  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  Continue supportive care with PT OT.  Left lower extremity weakness much improved since 07/12/2022, left upper extremity now minimally weak only.  Was discharged to CIR.  Now clinically stable will reevaluate discharge in the next 1 to 2 days CIR versus HH PT.   HIV - AIDs.  CD4 count less than 54 with CD4 helper T-cell percentage 5%.  Noncompliant with medications, counseled on compliance, ID on board defer management to ID.  He is now agreeable to taking his HIV medications as prescribed, see above.  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  Per ID requested ENT input, per ENT CT guided biopsy by IR if required or else outpatient ENT follow-up.  Discussed with Dr. Murlean Hark and ID physician Dr. Gwynn Burly.  He is s/p CT-guided biopsy of both parotid glands by IR on 07/10/2022, bilateral parotid gland FNAC done by IR appears unremarkable, prelim results noted on 07/12/2022, CSF autoimmune panel appears negative, CSF flow cytometry appears stable as well.  Dyslipidemia.  Placed on statin.  Asthma.  Stable no acute issues.  Obesity.  BMI 32.  Follow-up with PCP for weight loss.  Blood culture 1 out of 2 specimens positive for Staph species.  Likely  contamination.  Monitor.      Condition -   Guarded  Family Communication  : Updated wife on 07/11/2022  Code Status :  Full  Consults  :  ID, Neuro, ENT, IR  PUD Prophylaxis :    Procedures  :     CTA  1. Extensive bilateral pulmonary emboli with moderate-to-large thrombus volume and evidence of right heart strain. 2. Distended pulmonary trunk suggesting underlying pulmonary artery hypertension. 3. Patchy airspace disease in the lungs bilaterally, possible atelectasis or infiltrate. 4. Aneurysmal dilatation of the ascending aorta measuring 4.5 cm. Ascending thoracic aortic aneurysm. Recommend semi-annual imaging followup by CTA or MRA and referral to cardiothoracic surgery if not already obtained. This recommendation follows 2010 ACCF/AHA/AATS/ACR/ASA/SCA/SCAI/SIR/STS/SVM Guidelines for the Diagnosis and Management of Patients With Thoracic Aortic Disease. Circulation. 2010; 121: A213-Y865. Aortic aneurysm NOS (ICD10-I71.9) 5. Cardiomegaly with scattered coronary artery calcifications  Leg [place] - R Leg acute DVT  TTE - 1. Left ventricular ejection fraction, by estimation, is 60 to 65%. The left ventricle has normal function. Left ventricular endocardial border not optimally defined to evaluate regional wall motion. There is mild left ventricular hypertrophy. Left ventricular diastolic parameters are indeterminate.  2. Right ventricular systolic function was not well visualized. The right ventricular size is not well visualized. There is normal pulmonary artery systolic pressure.  3. The mitral valve is normal in structure. No evidence of mitral valve regurgitation. No evidence of mitral stenosis.  4. The tricuspid valve is abnormal.  5. The aortic valve is tricuspid. Aortic valve regurgitation is not visualized. No aortic stenosis is present.  6. Aortic dilatation noted. There is mild dilatation of the aortic root, measuring 40 mm. There is moderate dilatation of the ascending aorta, measuring 47  mm.  MRI - 1. Abnormal T2 hyperintense signal, primarily cortical/subcortical, in the right greater than left insula and postcentral gyrus, as well as in the splenium and left body of  the corpus callosum and possibly the right thalamus and midbrain. This is nonspecific, and while this could reflect sequela of prior infarcts, it is also concerning for an inflammatory or infectious process, such as PML, autoimmune encephalitis, or herpes simplex encephalitis, although this is not particularly typical of any the entities. MRI with contrast could be helpful. Correlate with symptoms and consider CSF testing. 2. Bilateral parotid masses are better seen on the prior CT and are incompletely imaged on this exam.      Disposition Plan  :    DVT Prophylaxis  :  Hep >> Eliquis    Lab Results  Component Value Date   PLT 169 07/17/2022    Diet :  Diet Order             Diet Heart Room service appropriate? Yes with Assist; Fluid consistency: Thin  Diet effective now                    Inpatient Medications  Scheduled Meds:  bictegravir-emtricitabine-tenofovir AF  1 tablet Oral Daily   cyanocobalamin  1,000 mcg Oral Daily   rosuvastatin  10 mg Oral Daily   sodium chloride flush  3 mL Intravenous Q12H   sulfamethoxazole-trimethoprim  1 tablet Oral Daily   valACYclovir  1,000 mg Oral TID   Continuous Infusions:  heparin 1,800 Units/hr (07/17/22 0600)   PRN Meds:.acetaminophen **OR** acetaminophen, albuterol, bisacodyl, morphine injection, ondansetron **OR** ondansetron (ZOFRAN) IV, senna-docusate   Objective:   Vitals:   07/16/22 2111 07/17/22 0028 07/17/22 0500 07/17/22 0512  BP: 127/76 126/78  114/72  Pulse: 90 79  61  Resp: (!) 25 20  18   Temp: 98.5 F (36.9 C) 97.6 F (36.4 C)  98.6 F (37 C)  TempSrc: Oral Oral  Oral  SpO2:      Weight:   93.8 kg     Wt Readings from Last 3 Encounters:  07/17/22 93.8 kg  07/14/22 95.3 kg  07/07/22 104.3 kg     Intake/Output  Summary (Last 24 hours) at 07/17/2022 0906 Last data filed at 07/17/2022 0600 Gross per 24 hour  Intake 1259.46 ml  Output 700 ml  Net 559.46 ml     Physical Exam  Awake Alert, No new F.N deficits, L leg 2/5, L arm mildly weak [place].AT,PERRAL Supple Neck, No JVD,   Symmetrical Chest wall movement, Good air movement bilaterally, CTAB RRR,No Gallops,Rubs or new Murmurs,  +ve B.Sounds, Abd Soft, No tenderness,   No Cyanosis, Clubbing or edema        Data Review:    Recent Labs  Lab 07/14/22 0622 07/14/22 2055 07/15/22 0127 07/16/22 0426 07/17/22 0453  WBC 4.1 5.4 5.5 4.8 3.7*  HGB 12.3* 13.1 13.4 12.1* 12.0*  HCT 37.0* 39.4 40.0 35.9* 36.7*  PLT 110* 116* 114* 136* 169  MCV 88.1 86.8 87.5 87.8 87.8  MCH 29.3 28.9 29.3 29.6 28.7  MCHC 33.2 33.2 33.5 33.7 32.7  RDW 13.3 13.3 13.4 13.2 13.2  LYMPHSABS 1.1 1.1 1.1 0.8 0.9  MONOABS 0.3 0.5 0.4 0.5 0.4  EOSABS 0.1 0.0 0.1 0.1 0.2  BASOSABS 0.0 0.0 0.0 0.0 0.0    Recent Labs  Lab 07/12/22 0630 07/13/22 0709 07/14/22 0622 07/14/22 2055 07/15/22 0127 07/16/22 0426 07/17/22 0453  NA 134* 137 135 136 135 137 137  K 3.6 3.7 3.5 3.6 3.6 3.6 3.6  CL 102 104 107 105 103 102 103  CO2 23 24 20* 18* 21* 20* 24  ANIONGAP  9 9 8 13 11 15 10   GLUCOSE 119* 99 106* 118* 118* 101* 97  BUN 7 7 8 10 10 13 12   CREATININE 0.87 0.89 0.81 0.90 0.92 0.84 0.99  AST  --   --   --  14* 16 14* 14*  ALT  --   --   --  16 18 14 12   ALKPHOS  --   --   --  62 65 57 60  BILITOT  --   --   --  0.8 0.8 0.7 0.2*  ALBUMIN  --   --   --  3.3* 3.3* 2.9* 2.9*  PROCALCITON  --   --   --  <0.10  --   --   --   LATICACIDVEN  --   --   --  0.9  --   --   --   BNP 31.9 57.0 49.1  --   --  65.7 35.1  MG 2.3 2.1 2.1  --   --  2.1 2.1  CALCIUM 8.4* 8.5* 8.6* 8.8* 8.9 9.0 9.0      Recent Labs  Lab 07/12/22 0630 07/13/22 0709 07/14/22 0622 07/14/22 2055 07/15/22 0127 07/16/22 0426 07/17/22 0453  PROCALCITON  --   --   --  <0.10  --   --   --    LATICACIDVEN  --   --   --  0.9  --   --   --   BNP 31.9 57.0 49.1  --   --  65.7 35.1  MG 2.3 2.1 2.1  --   --  2.1 2.1  CALCIUM 8.4* 8.5* 8.6* 8.8* 8.9 9.0 9.0     Lab Results  Component Value Date   CHOL 197 04/10/2022   HDL 39 (L) 04/10/2022   LDLCALC 126 (H) 04/10/2022   TRIG 158 (H) 04/10/2022   CHOLHDL 5.1 04/10/2022    Lab Results  Component Value Date   HGBA1C 6.6 (H) 04/10/2022    Radiology Reports ECHOCARDIOGRAM COMPLETE  Result Date: 07/15/2022    ECHOCARDIOGRAM REPORT   Patient Name:   Jose Lamb Date of Exam: 07/15/2022 Medical Rec #:  [id number]      Height:       71.0 in Accession #:    [id number]     Weight:       209.7 lb Date of Birth:  01/11/1961      BSA:          2.151 m Patient Age:    62 years       BP:           135/88 mmHg Patient Gender: M              HR:           96 bpm. Exam Location:  Inpatient Procedure: 2D Echo, Cardiac Doppler and Color Doppler Indications:    Stroke  History:        Patient has no prior history of Echocardiogram examinations,                 most recent 11/27/2019. Arrythmias:Atrial Fibrillation; Risk                 Factors:Hypertension and Diabetes.  Sonographer:    Lucy Antigua Referring Phys: [id number] VISHAL R PATEL  Sonographer Comments: Suboptimal apical window and no subcostal window. Image acquisition challenging due to patient body habitus. Patient complained of subcostal/chest pain. He was not able to depict  exactly where the pain was. Wanted to end exam. IMPRESSIONS  1. Left ventricular ejection fraction, by estimation, is 60 to 65%. The left ventricle has normal function. Left ventricular endocardial border not optimally defined to evaluate regional wall motion. There is mild left ventricular hypertrophy. Left ventricular diastolic parameters are indeterminate.  2. Right ventricular systolic function was not well visualized. The right ventricular size is not well visualized. There is normal pulmonary artery systolic pressure.  3.  The mitral valve is normal in structure. No evidence of mitral valve regurgitation. No evidence of mitral stenosis.  4. The tricuspid valve is abnormal.  5. The aortic valve is tricuspid. Aortic valve regurgitation is not visualized. No aortic stenosis is present.  6. Aortic dilatation noted. There is mild dilatation of the aortic root, measuring 40 mm. There is moderate dilatation of the ascending aorta, measuring 47 mm. FINDINGS  Left Ventricle: Left ventricular ejection fraction, by estimation, is 60 to 65%. The left ventricle has normal function. Left ventricular endocardial border not optimally defined to evaluate regional wall motion. The left ventricular internal cavity size was normal in size. There is mild left ventricular hypertrophy. Left ventricular diastolic parameters are indeterminate. Right Ventricle: The right ventricular size is not well visualized. Right vetricular wall thickness was not well visualized. Right ventricular systolic function was not well visualized. There is normal pulmonary artery systolic pressure. The tricuspid regurgitant velocity is 2.69 m/s, and with an assumed right atrial pressure of 3 mmHg, the estimated right ventricular systolic pressure is 31.9 mmHg. Left Atrium: Left atrial size was normal in size. Right Atrium: Right atrial size was normal in size. Pericardium: The pericardium was not well visualized. Mitral Valve: The mitral valve is normal in structure. No evidence of mitral valve regurgitation. No evidence of mitral valve stenosis. Tricuspid Valve: The tricuspid valve is abnormal. Tricuspid valve regurgitation is mild . No evidence of tricuspid stenosis. Aortic Valve: The aortic valve is tricuspid. Aortic valve regurgitation is not visualized. No aortic stenosis is present. Aortic valve mean gradient measures 4.0 mmHg. Aortic valve peak gradient measures 7.2 mmHg. Aortic valve area, by VTI measures 3.15 cm. Pulmonic Valve: The pulmonic valve was not well  visualized. Pulmonic valve regurgitation is trivial. No evidence of pulmonic stenosis. Aorta: Aortic dilatation noted. There is mild dilatation of the aortic root, measuring 40 mm. There is moderate dilatation of the ascending aorta, measuring 47 mm. IAS/Shunts: The interatrial septum was not well visualized.  LEFT VENTRICLE PLAX 2D LVIDd:         3.15 cm   Diastology LVIDs:         2.60 cm   LV e' medial:  10.52 cm/s LV PW:         1.25 cm   LV e' lateral: 8.70 cm/s LV IVS:        1.15 cm LVOT diam:     2.20 cm LV SV:         67 LV SV Index:   31 LVOT Area:     3.80 cm  RIGHT VENTRICLE RV S prime:     20.50 cm/s TAPSE (M-mode): 2.5 cm LEFT ATRIUM             Index        RIGHT ATRIUM           Index LA Vol (A2C):   82.8 ml 38.49 ml/m  RA Area:     20.50 cm LA Vol (A4C):   52.6 ml 24.45 ml/m  RA Volume:   54.50 ml  25.33 ml/m LA Biplane Vol: 68.5 ml 31.84 ml/m  AORTIC VALVE AV Area (Vmax):    2.92 cm AV Area (Vmean):   2.51 cm AV Area (VTI):     3.15 cm AV Vmax:           134.00 cm/s AV Vmean:          99.200 cm/s AV VTI:            0.211 m AV Peak Grad:      7.2 mmHg AV Mean Grad:      4.0 mmHg LVOT Vmax:         103.00 cm/s LVOT Vmean:        65.500 cm/s LVOT VTI:          0.175 m LVOT/AV VTI ratio: 0.83  AORTA Ao Root diam: 4.00 cm Ao Asc diam:  4.70 cm TRICUSPID VALVE TR Peak grad:   28.9 mmHg TR Vmax:        269.00 cm/s  SHUNTS Systemic VTI:  0.18 m Systemic Diam: 2.20 cm Dina Rich MD Electronically signed by Dina Rich MD Signature Date/Time: 07/15/2022/12:43:16 PM    Final    VAS [place] LOWER EXTREMITY VENOUS (DVT)  Result Date: 07/15/2022  Lower Venous DVT Study Patient Name:  Jose Lamb  Date of Exam:   07/15/2022 Medical Rec #: [id number]       Accession #:    [id number] Date of Birth: 06/13/1960       Patient Gender: M Patient Age:   18 years Exam Location:  [facility] Procedure:      VAS [place] LOWER EXTREMITY VENOUS (DVT) Referring Phys: Eston Esters PATEL  --------------------------------------------------------------------------------  Indications: Pulmonary embolism.  Anticoagulation: Heparin. Limitations: Poor ultrasound/tissue interface and body habitus. Comparison Study: No previous study. Performing Technologist: McKayla Maag RVT, [place]  Examination Guidelines: A complete evaluation includes B-mode imaging, spectral Doppler, color Doppler, and power Doppler as needed of all accessible portions of each vessel. Bilateral testing is considered an integral part of a complete examination. Limited examinations for reoccurring indications may be performed as noted. The reflux portion of the exam is performed with the patient in reverse Trendelenburg.  +---------+---------------+---------+-----------+----------+--------------+ RIGHT    CompressibilityPhasicitySpontaneityPropertiesThrombus Aging +---------+---------------+---------+-----------+----------+--------------+ CFV      Full           Yes      Yes                                 +---------+---------------+---------+-----------+----------+--------------+ SFJ      Full                                                        +---------+---------------+---------+-----------+----------+--------------+ FV Prox  Full                                                        +---------+---------------+---------+-----------+----------+--------------+ FV Mid   Full                                                        +---------+---------------+---------+-----------+----------+--------------+  FV DistalFull                                                        +---------+---------------+---------+-----------+----------+--------------+ PFV      Full                                                        +---------+---------------+---------+-----------+----------+--------------+ POP      Full           Yes      Yes                                  +---------+---------------+---------+-----------+----------+--------------+ PTV      Full                                                        +---------+---------------+---------+-----------+----------+--------------+ PERO                                                  Not visualized +---------+---------------+---------+-----------+----------+--------------+ Gastroc  None           No       No                   Acute          +---------+---------------+---------+-----------+----------+--------------+   +---------+---------------+---------+-----------+----------+--------------+ LEFT     CompressibilityPhasicitySpontaneityPropertiesThrombus Aging +---------+---------------+---------+-----------+----------+--------------+ CFV      Full           Yes      Yes                                 +---------+---------------+---------+-----------+----------+--------------+ SFJ      Full                                                        +---------+---------------+---------+-----------+----------+--------------+ FV Prox  Full                                                        +---------+---------------+---------+-----------+----------+--------------+ FV Mid   Full                                                        +---------+---------------+---------+-----------+----------+--------------+ FV DistalFull                                                        +---------+---------------+---------+-----------+----------+--------------+  PFV      Full                                                        +---------+---------------+---------+-----------+----------+--------------+ POP      Full           Yes      Yes                                 +---------+---------------+---------+-----------+----------+--------------+ PTV      Full                                                         +---------+---------------+---------+-----------+----------+--------------+ PERO                                                  Not visualized +---------+---------------+---------+-----------+----------+--------------+     Summary: RIGHT: - Findings consistent with acute deep vein thrombosis involving the right gastrocnemius veins. - Portions of this examination were limited- see technologist comments above. - No cystic structure found in the popliteal fossa.  LEFT: - There is no evidence of deep vein thrombosis in the lower extremity. However, portions of this examination were limited- see technologist comments above.  - No cystic structure found in the popliteal fossa.  *See table(s) above for measurements and observations. Electronically signed by Gerarda Fraction on 07/15/2022 at 9:39:10 AM.    Final    CT Angio Chest Pulmonary Embolism (PE) W or WO Contrast  Addendum Date: 07/14/2022   ADDENDUM REPORT: 07/14/2022 23:07 ADDENDUM: Critical Value/emergent results were called by telephone at the time of interpretation on 07/14/2022 at 11:07 pm to provider VISHAL PATEL , who verbally acknowledged these results. Electronically Signed   By: Thornell Sartorius M.D.   On: 07/14/2022 23:07   Result Date: 07/14/2022 CLINICAL DATA:  Pulmonary embolism suspected, high probability. EXAM: CT ANGIOGRAPHY CHEST WITH CONTRAST TECHNIQUE: Multidetector CT imaging of the chest was performed using the standard protocol during bolus administration of intravenous contrast. Multiplanar CT image reconstructions and MIPs were obtained to evaluate the vascular anatomy. RADIATION DOSE REDUCTION: This exam was performed according to the departmental dose-optimization program which includes automated exposure control, adjustment of the mA and/or kV according to patient size and/or use of iterative reconstruction technique. CONTRAST:  75mL OMNIPAQUE IOHEXOL 350 MG/ML SOLN COMPARISON:  04/06/2022. FINDINGS: Cardiovascular: Heart is mildly  enlarged and there is a trace pericardial effusion. Scattered coronary artery calcifications are present. There is aneurysmal dilatation of the ascending aorta measuring 4.5 cm. The pulmonary trunk is distended suggesting underlying pulmonary artery hypertension. There are bilateral pulmonary emboli involving lobar, segmental, and subsegmental arteries with moderate-to-large thrombus burden. The right ventricle is distended suggesting underlying pulmonary artery hypertension. Mediastinum/Nodes: No mediastinal, hilar, or axillary lymphadenopathy by size criteria. The thyroid gland, trachea, and esophagus are within normal limits. Lungs/Pleura: Patchy airspace disease is noted bilaterally and most pronounced in the lower lobes. No effusion or pneumothorax. Upper  Abdomen: No acute abnormality. Musculoskeletal: Old rib fractures are noted on the left. There are degenerative changes in the thoracic spine. No acute fracture. Review of the MIP images confirms the above findings. IMPRESSION: 1. Extensive bilateral pulmonary emboli with moderate-to-large thrombus volume and evidence of right heart strain. 2. Distended pulmonary trunk suggesting underlying pulmonary artery hypertension. 3. Patchy airspace disease in the lungs bilaterally, possible atelectasis or infiltrate. 4. Aneurysmal dilatation of the ascending aorta measuring 4.5 cm. Ascending thoracic aortic aneurysm. Recommend semi-annual imaging followup by CTA or MRA and referral to cardiothoracic surgery if not already obtained. This recommendation follows 2010 ACCF/AHA/AATS/ACR/ASA/SCA/SCAI/SIR/STS/SVM Guidelines for the Diagnosis and Management of Patients With Thoracic Aortic Disease. Circulation. 2010; 121: Z610-R604. Aortic aneurysm NOS (ICD10-I71.9) 5. Cardiomegaly with scattered coronary artery calcifications. Electronically Signed: By: Thornell Sartorius M.D. On: 07/14/2022 23:02   DG CHEST PORT 1 VIEW  Result Date: 07/14/2022 CLINICAL DATA:  Tachypnea EXAM:  PORTABLE CHEST 1 VIEW COMPARISON:  07/05/2022 FINDINGS: Lung volumes are small and there is bibasilar atelectasis. No superimposed confluent pulmonary infiltrate. No pneumothorax or pleural effusion. Cardiac size within normal limits. Pulmonary vascularity is normal. No acute bone abnormality. IMPRESSION: 1. Pulmonary hypoinflation. Electronically Signed   By: Helyn Numbers M.D.   On: 07/14/2022 20:45   EEG adult  Result Date: 07/13/2022 Charlsie Quest, MD     07/13/2022 10:05 PM Patient Name: Jose Lamb MRN: [id number] Epilepsy Attending: Charlsie Quest Referring Physician/Provider: Leroy Sea, MD Date: 07/13/2022 Duration: 23.28 mins Patient history:  62 year old male with a 6-week history of increasing left-sided weakness and hemisensory deficits getting eeg to evaluate for seizure. Level of alertness: Awake, asleep AEDs during EEG study: None Technical aspects: This EEG study was done with scalp electrodes positioned according to the 10-20 International system of electrode placement. Electrical activity was reviewed with band pass filter of 1-70Hz , sensitivity of 7 uV/mm, display speed of 53mm/sec with a 60Hz  notched filter applied as appropriate. EEG data were recorded continuously and digitally stored.  Video monitoring was available and reviewed as appropriate. Description: The posterior dominant rhythm consists of 9 Hz activity of moderate voltage (25-35 uV) seen predominantly in posterior head regions, symmetric and reactive to eye opening and eye closing. Sleep was characterized by vertex waves, sleep spindles (12 to 14 Hz), maximal frontocentral region. Physiologic photic driving was seen during photic stimulation.  Hyperventilation was not performed.   IMPRESSION: This study is within normal limits. No seizures or epileptiform discharges were seen throughout the recording. A normal interictal EEG does not exclude the diagnosis of epilepsy. Charlsie Quest      Signature  -   Susa Raring M.D on 07/17/2022 at 9:06 AM   -  To page go to [email or web address]

## 2022-07-17 NOTE — Progress Notes (Signed)
ANTICOAGULATION CONSULT NOTE - Follow Up Consult  Pharmacy Consult for heparin>>apixaban Indication: pulmonary embolus   Allergies  Allergen Reactions   Asa [Aspirin] Other (See Comments)    Epistaxis   Patient Measurements: Weight: 93.8 kg (206 lb 12.7 oz)  Vital Signs: Temp: 98.6 F (37 C) (06/10 0512) Temp Source: Oral (06/10 0512) BP: 114/72 (06/10 0512) Pulse Rate: 61 (06/10 0512)  Labs: Recent Labs    07/15/22 0127 07/15/22 0340 07/15/22 0614 07/15/22 1217 07/16/22 0426 07/17/22 0453  HGB 13.4  --   --   --  12.1* 12.0*  HCT 40.0  --   --   --  35.9* 36.7*  PLT 114*  --   --   --  136* 169  HEPARINUNFRC  --   --    < > 0.45 0.31 0.39  CREATININE 0.92  --   --   --  0.84 0.99  TROPONINIHS 16 14  --   --   --   --    < > = values in this interval not displayed.    Estimated Creatinine Clearance: 92.8 mL/min (by C-G formula based on SCr of 0.99 mg/dL).  Medications:  Infusions:    Assessment: 62yo male had been admitted on 5/31 for weakness and treated for PML, transferred to CIR 6/7 and found to be tachycardic with tachypnea and fever. Patient transferred back to [facility] on 6/7. CT reveals extensive bilateral PE w/ mod-large thrombus volume. Pharmacy consulted to dose heparin. Pt has Afib but not on anticoagulation prior to admission; had previously been on apixaban for PE in 2021 thought to be provoked by Covid. Heparin level today is at the lower range of therapeutic at 0.31, on 1600 units/hr. Hgb 12.1, plt 136 stable. No line issues or signs/symptoms of bleeding per RN.  Plan to change to apixaban today.   Goal of Therapy:  Monitor platelets by anticoagulation protocol: Yes   Plan:  Dc heparin Apixaban 10mg  BID x7d then 5mg  BID Rx will monitor peripherally  Ulyses Southward, PharmD, BCIDP, AAHIVP, CPP Infectious Disease Pharmacist 07/17/2022 9:14 AM

## 2022-07-17 NOTE — TOC Transition Note (Signed)
TOC discharge medications READY and LOCATED in TOC pharmacy on 2nd floor awaiting pt discharge.  

## 2022-07-18 ENCOUNTER — Inpatient Hospital Stay (HOSPITAL_COMMUNITY): Payer: BC Managed Care – PPO

## 2022-07-18 DIAGNOSIS — R079 Chest pain, unspecified: Secondary | ICD-10-CM | POA: Diagnosis not present

## 2022-07-18 DIAGNOSIS — I2699 Other pulmonary embolism without acute cor pulmonale: Secondary | ICD-10-CM | POA: Diagnosis not present

## 2022-07-18 DIAGNOSIS — J9811 Atelectasis: Secondary | ICD-10-CM | POA: Diagnosis not present

## 2022-07-18 LAB — CBC WITH DIFFERENTIAL/PLATELET
Abs Immature Granulocytes: 0.02 10*3/uL (ref 0.00–0.07)
Basophils Absolute: 0 10*3/uL (ref 0.0–0.1)
Basophils Relative: 1 %
Eosinophils Absolute: 0.2 10*3/uL (ref 0.0–0.5)
Eosinophils Relative: 5 %
HCT: 37.2 % — ABNORMAL LOW (ref 39.0–52.0)
Hemoglobin: 12.3 g/dL — ABNORMAL LOW (ref 13.0–17.0)
Immature Granulocytes: 1 %
Lymphocytes Relative: 33 %
Lymphs Abs: 1.3 10*3/uL (ref 0.7–4.0)
MCH: 29.4 pg (ref 26.0–34.0)
MCHC: 33.1 g/dL (ref 30.0–36.0)
MCV: 88.8 fL (ref 80.0–100.0)
Monocytes Absolute: 0.5 10*3/uL (ref 0.1–1.0)
Monocytes Relative: 13 %
Neutro Abs: 1.9 10*3/uL (ref 1.7–7.7)
Neutrophils Relative %: 47 %
Platelets: 206 10*3/uL (ref 150–400)
RBC: 4.19 MIL/uL — ABNORMAL LOW (ref 4.22–5.81)
RDW: 13.3 % (ref 11.5–15.5)
WBC: 3.9 10*3/uL — ABNORMAL LOW (ref 4.0–10.5)
nRBC: 0 % (ref 0.0–0.2)

## 2022-07-18 LAB — COMPREHENSIVE METABOLIC PANEL
ALT: 14 U/L (ref 0–44)
AST: 14 U/L — ABNORMAL LOW (ref 15–41)
Albumin: 2.9 g/dL — ABNORMAL LOW (ref 3.5–5.0)
Alkaline Phosphatase: 56 U/L (ref 38–126)
Anion gap: 10 (ref 5–15)
BUN: 13 mg/dL (ref 6–20)
CO2: 24 mmol/L (ref 22–32)
Calcium: 9.3 mg/dL (ref 8.9–10.3)
Chloride: 104 mmol/L (ref 98–111)
Creatinine, Ser: 0.95 mg/dL (ref 0.61–1.24)
GFR, Estimated: >60 mL/min (ref >60)
Glucose, Bld: 104 mg/dL — ABNORMAL HIGH (ref 70–99)
Potassium: 3.6 mmol/L (ref 3.5–5.1)
Sodium: 138 mmol/L (ref 135–145)
Total Bilirubin: 0.5 mg/dL (ref 0.3–1.2)
Total Protein: 7.4 g/dL (ref 6.5–8.1)

## 2022-07-18 LAB — TROPONIN I (HIGH SENSITIVITY): Troponin I (High Sensitivity): 8 ng/L (ref <18)

## 2022-07-18 LAB — MAGNESIUM: Magnesium: 2 mg/dL (ref 1.7–2.4)

## 2022-07-18 LAB — BRAIN NATRIURETIC PEPTIDE: B Natriuretic Peptide: 29.5 pg/mL (ref 0.0–100.0)

## 2022-07-18 LAB — CULTURE, BLOOD (ROUTINE X 2): Special Requests: ADEQUATE

## 2022-07-18 MED ORDER — HYDROMORPHONE HCL 1 MG/ML IJ SOLN
1.0000 mg | Freq: Once | INTRAMUSCULAR | Status: AC
  Administered 2022-07-18: 1 mg via INTRAVENOUS
  Filled 2022-07-18: qty 1

## 2022-07-18 MED ORDER — APIXABAN 5 MG PO TABS: 10.0000 mg | ORAL_TABLET | Freq: Two times a day (BID) | ORAL | Status: DC

## 2022-07-18 MED ORDER — APIXABAN 5 MG PO TABS: 5.0000 mg | ORAL_TABLET | Freq: Two times a day (BID) | ORAL | Status: DC

## 2022-07-18 MED ORDER — ACETAMINOPHEN 325 MG PO TABS: 650.0000 mg | ORAL_TABLET | Freq: Four times a day (QID) | ORAL | Status: DC | PRN

## 2022-07-18 NOTE — Evaluation (Signed)
Occupational Therapy Evaluation Patient Details Name: Xzayvier Lennert MRN: [id number] DOB: Oct 18, 1960 Today's Date: 07/18/2022   History of Present Illness Aziel Schad is a 62 y.o. male who presents to [facility] hospital on 07/08/2022 as a transfer from [facility] with 6 weeks of left-sided weakness and frequent falls. MRI brain revealed concern for inflammatory or infectious process. Pt discharged to [facility] on 6/7, found to have bilateral PE same date and was readmitted. PMHx: CVA, paroxysmal atrial fibrillation not on oral anticoagulation, asthma, HIV with noncompliance, history of pulmonary embolism.   Clinical Impression   Pt admitted from inpatient rehab unit with the above diagnosis. Pt currently with functional limitations due to the deficits listed below (see OT Problem List). Prior to admit, pt was receiving in house inpatient rehab since 07/14/22. Prior to rehab, pt was living at home with wife, independent with all ADL tasks and functional mobility. Pt will benefit from acute skilled OT to increase their safety and independence with ADL and functional mobility for ADL to facilitate discharge. Patient will benefit from intensive inpatient follow up therapy, >3 hours/day. OT will continue to follow patient acutely.         Recommendations for follow up therapy are one component of a multi-disciplinary discharge planning process, led by the attending physician.  Recommendations may be updated based on patient status, additional functional criteria and insurance authorization.   Assistance Recommended at Discharge Frequent or constant Supervision/Assistance  Patient can return home with the following A lot of help with walking and/or transfers;Two people to help with walking and/or transfers;A lot of help with bathing/dressing/bathroom;Two people to help with bathing/dressing/bathroom;Assistance with cooking/housework;Assist for transportation;Help with stairs or ramp for entrance;Direct  supervision/assist for financial management;Direct supervision/assist for medications management    Functional Status Assessment  Patient has had a recent decline in their functional status and demonstrates the ability to make significant improvements in function in a reasonable and predictable amount of time.  Equipment Recommendations  Other (comment) (defer to next venue of care)    Recommendations for Other Services Rehab consult     Precautions / Restrictions Precautions Precautions: Fall Precaution Comments: Lt hemi, poor sitting/standing balance Restrictions Weight Bearing Restrictions: No      Mobility Bed Mobility Overal bed mobility: Needs Assistance Bed Mobility: Supine to Sit     Supine to sit: Min assist, HOB elevated     General bed mobility comments: Increased time required to transition to sitting on EOB. Pt presents with decreased endurance and activity tolerance. VC provided for sequencing. Patient Response: Cooperative  Transfers Overall transfer level: Needs assistance Equipment used: Rolling walker (2 wheels) Transfers: Sit to/from Stand, Bed to chair/wheelchair/BSC Sit to Stand: Min guard, From elevated surface     Step pivot transfers: Mod assist     General transfer comment: VC for hand placement during RW management. VC for sequencing such as weight shifting to the right leg in order to adduct left leg or bring leg back towards recliner. Required physical assist to manage left leg during transfer.      Balance Overall balance assessment: Needs assistance Sitting-balance support: Feet supported, Bilateral upper extremity supported Sitting balance-Leahy Scale: Poor Sitting balance - Comments: Pt initially required BUE to maintain balance. With VC for weight shifting, pt was able to maintain balance for a short period of time while both hands were resting on lap.Unable to keep left leg in safe position while seated while hip preferred to  externally rotate throwing off sitting balance. Required physical  assist to correct. Postural control: Left lateral lean Standing balance support: Bilateral upper extremity supported, During functional activity, Reliant on assistive device for balance Standing balance-Leahy Scale: Poor Standing balance comment: Static standing Min A.              ADL either performed or assessed with clinical judgement   ADL Overall ADL's : Needs assistance/impaired Eating/Feeding: Set up;Sitting   Grooming: Wash/dry face;Sitting;Supervision/safety   Upper Body Bathing: Set up;Sitting   Lower Body Bathing: Total assistance;Sit to/from stand   Upper Body Dressing : Min guard;Sitting   Lower Body Dressing: Total assistance;Sit to/from stand   Toilet Transfer: Maximal assistance;Stand-pivot;BSC/3in1;Rolling walker (2 wheels);Cueing for sequencing;Cueing for safety Toilet Transfer Details (indicate cue type and reason): towards right side Toileting- Clothing Manipulation and Hygiene: Total assistance;Sit to/from stand;Cueing for safety;Cueing for sequencing       Functional mobility during ADLs: Rolling walker (2 wheels);Cueing for sequencing;Cueing for safety;Moderate assistance       Vision Baseline Vision/History: 1 Wears glasses Ability to See in Adequate Light: 0 Adequate Patient Visual Report: No change from baseline Vision Assessment?: No apparent visual deficits            Pertinent Vitals/Pain Pain Assessment Pain Assessment: 0-10 Pain Score: 5  Pain Location: chest Pain Descriptors / Indicators: Sore Pain Intervention(s): Monitored during session     Hand Dominance Right   Extremity/Trunk Assessment Upper Extremity Assessment Upper Extremity Assessment: Generalized weakness;RUE deficits/detail;[name] deficits/detail RUE Deficits / Details: A/ROM WFL. 4-/5 overall in shoulder, elbow (all ranges). Impaired gross grasp. RUE Sensation: WNL RUE Coordination: WNL [name] Deficits  / Details: 3/5 shoulder, elbow (all ranges). Impaired gross grasp with inattention noted. [name] Sensation: decreased light touch [name] Coordination: decreased fine motor;decreased gross motor   Lower Extremity Assessment LLE Deficits / Details: Increased difficulty with picking up LLE while standing and attempting step pivot transfer towards recliner on the right side. Required physical assist to move left leg.   Cervical / Trunk Assessment Cervical / Trunk Assessment: Kyphotic   Communication Communication Communication: No difficulties   Cognition Arousal/Alertness: Awake/alert Behavior During Therapy: WFL for tasks assessed/performed Overall Cognitive Status: Impaired/Different from baseline Area of Impairment: Attention, Memory, Following commands, Safety/judgement, Awareness, Problem solving    Memory: Decreased recall of precautions, Decreased short-term memory Following Commands: Follows one step commands with increased time Safety/Judgement: Decreased awareness of safety, Decreased awareness of deficits   Problem Solving: Slow processing, Difficulty sequencing, Requires verbal cues, Requires tactile cues, Decreased initiation General Comments: Required repeated instructions and cues during bed mobility and transfers.     General Comments  BP supine: 135/88 (102). HR elevated to 108 at the highest during functional activity. SpO2 93% on RA when [place] was removed.            Home Living Family/patient expects to be discharged to:: Inpatient rehab Living Arrangements: Spouse/significant other;Children;Other relatives Available Help at Discharge: Family Type of Home: House Home Access: Stairs to enter [employer] of Steps: 2 Entrance Stairs-Rails: None Home Layout: Multi-level;Laundry or work area in basement;Full bath on main level;Able to live on main level with bedroom/bathroom     Bathroom Shower/Tub: Chief Strategy Officer: Standard Bathroom  Accessibility: Yes   Home Equipment: Wheelchair - power;Cane - single point          Prior Functioning/Environment Prior Level of Function : Independent/Modified Independent;Needs assist;Driving;Working/employed;History of Falls (last six months)       Physical Assist : Mobility (physical);ADLs (physical) Mobility (physical):  Transfers;Gait ADLs (physical): Bathing;Dressing;Toileting Mobility Comments: prior to ~7 weeks ago pt was independent with no device and working full time driving/operating large equipment. Over last 7 weeks pt has had worsening weakness in Lt side and decreased sensation, pt reports at least 8 falls. He has a SPC he has been using for some ambulation in home and power WC.          OT Problem List: Decreased strength;Decreased range of motion;Decreased activity tolerance;Impaired balance (sitting and/or standing);Decreased cognition;Decreased safety awareness;Decreased knowledge of precautions;Decreased knowledge of use of DME or AE;Impaired sensation;Impaired UE functional use;Decreased coordination      OT Treatment/Interventions: Self-care/ADL training;Therapeutic exercise;Neuromuscular education;DME and/or AE instruction;Therapeutic activities;Patient/family education;Balance training    OT Goals(Current goals can be found in the care plan section) Acute Rehab OT Goals Patient Stated Goal: return to rehab OT Goal Formulation: Patient unable to participate in goal setting Time For Goal Achievement: 08/01/22 Potential to Achieve Goals: Good  OT Frequency: Min 2X/week       AM-PAC OT "6 Clicks" Daily Activity     Outcome Measure Help from another person eating meals?: A Little Help from another person taking care of personal grooming?: A Little Help from another person toileting, which includes using toliet, bedpan, or urinal?: Total Help from another person bathing (including washing, rinsing, drying)?: Total Help from another person to put on and  taking off regular upper body clothing?: A Little Help from another person to put on and taking off regular lower body clothing?: Total 6 Click Score: 12   End of Session Equipment Utilized During Treatment: Gait belt;Rolling walker (2 wheels);Oxygen Nurse Communication: Mobility status;Other (comment) (Pt left on RA)  Activity Tolerance: Patient tolerated treatment well Patient left: in chair;with call bell/phone within reach;with chair alarm set  OT Visit Diagnosis: Unsteadiness on feet (R26.81);Other abnormalities of gait and mobility (R26.89);Muscle weakness (generalized) (M62.81);History of falling (Z91.81);Repeated falls (R29.6);Hemiplegia and hemiparesis Hemiplegia - Right/Left: Left Hemiplegia - dominant/non-dominant: Non-Dominant Hemiplegia - caused by: Unspecified                Time: 4098-1191 OT Time Calculation (min): 37 min Charges:  OT General Charges $OT Visit: 1 Visit OT Evaluation $OT Eval High Complexity: 1 High OT Treatments $Therapeutic Activity: 8-22 mins  Limmie Patricia, OTR/L,CBIS  Supplemental OT - [facility] and [facility] Secure Chat Preferred    Dajuan Turnley, Charisse March 07/18/2022, 9:57 AM

## 2022-07-18 NOTE — Discharge Instructions (Addendum)
Follow with Primary MD Jose Epley, [name] in 7 days, also follow-up with the recommended ID physician in the next 2 to 3 weeks.  Get CBC, CMP, 2 view Chest X ray -  checked next visit with your primary MD or CIR MD   Activity: As tolerated with Full fall precautions use walker/cane & assistance as needed  Disposition CIR  Diet: Heart Healthy    Special Instructions: If you have smoked or chewed Tobacco  in the last 2 yrs please stop smoking, stop any regular Alcohol  and or any Recreational drug use.  On your next visit with your primary care physician please Get Medicines reviewed and adjusted.  Please request your Prim.MD to go over all Hospital Tests and Procedure/Radiological results at the follow up, please get all Hospital records sent to your Prim MD by signing hospital release before you go home.  If you experience worsening of your admission symptoms, develop shortness of breath, life threatening emergency, suicidal or homicidal thoughts you must seek medical attention immediately by calling 911 or calling your MD immediately  if symptoms less severe.  You Must read complete instructions/literature along with all the possible adverse reactions/side effects for all the Medicines you take and that have been prescribed to you. Take any new Medicines after you have completely understood and accpet all the possible adverse reactions/side effects.     Information on my medicine - ELIQUIS (apixaban)  This medication education was reviewed with me or my healthcare representative as part of my discharge preparation.  The pharmacist that spoke with me during my hospital stay was:    Why was Eliquis prescribed for you? Eliquis was prescribed to treat blood clots that may have been found in the veins of your legs (deep vein thrombosis) or in your lungs (pulmonary embolism) and to reduce the risk of them occurring again.  What do You need to know about Eliquis ? The starting dose  is 10 mg (two 5 mg tablets) taken TWICE daily for the FIRST SEVEN (7) DAYS, then on (enter date)  07/24/22  the dose is reduced to ONE 5 mg tablet taken TWICE daily.  Eliquis may be taken with or without food.   Try to take the dose about the same time in the morning and in the evening. If you have difficulty swallowing the tablet whole please discuss with your pharmacist how to take the medication safely.  Take Eliquis exactly as prescribed and DO NOT stop taking Eliquis without talking to the doctor who prescribed the medication.  Stopping may increase your risk of developing a new blood clot.  Refill your prescription before you run out.  After discharge, you should have regular check-up appointments with your healthcare provider that is prescribing your Eliquis.    What do you do if you miss a dose? If a dose of ELIQUIS is not taken at the scheduled time, take it as soon as possible on the same day and twice-daily administration should be resumed. The dose should not be doubled to make up for a missed dose.  Important Safety Information A possible side effect of Eliquis is bleeding. You should call your healthcare provider right away if you experience any of the following: Bleeding from an injury or your nose that does not stop. Unusual colored urine (red or dark brown) or unusual colored stools (red or black). Unusual bruising for unknown reasons. A serious fall or if you hit your head (even if there is no bleeding).  Some medicines may interact with Eliquis and might increase your risk of bleeding or clotting while on Eliquis. To help avoid this, consult your healthcare provider or pharmacist prior to using any new prescription or non-prescription medications, including herbals, vitamins, non-steroidal anti-inflammatory drugs (NSAIDs) and supplements.  This website has more information on Eliquis (apixaban): [email or web address]://[email or web address].[email or web address]/home

## 2022-07-18 NOTE — Progress Notes (Signed)
Physical Therapy Treatment Patient Details Name: Jose Lamb MRN: [id number] DOB: 1960-03-28 Today's Date: 07/18/2022   History of Present Illness Mahendra Pruitte is a 62 y.o. male who presents to [facility] hospital on 07/08/2022 as a transfer from [facility] with 6 weeks of left-sided weakness and frequent falls. MRI brain revealed concern for inflammatory or infectious process. Pt discharged to [facility] on 6/7, found to have bilateral PE same date and was readmitted. PMHx: CVA, paroxysmal atrial fibrillation not on oral anticoagulation, asthma, HIV with noncompliance, history of pulmonary embolism.    PT Comments    Patient agreeable to PT session. He was seated in the chair on arrival to room. He is hoping to get to rehab soon. Patient continues to require physical assistance with transfers. Poor standing balance with external support required and occasional left side lean. Focus on pre-gait activity in standing, weight shifting and increased weight acceptance on LLE. Therapeutic exercises provided for LLE for strengthening. Continue to recommend intensive therapy >3 hours per day upon hospital discharge.    Recommendations for follow up therapy are one component of a multi-disciplinary discharge planning process, led by the attending physician.  Recommendations may be updated based on patient status, additional functional criteria and insurance authorization.  Follow Up Recommendations       Assistance Recommended at Discharge Frequent or constant Supervision/Assistance  Patient can return home with the following Two people to help with walking and/or transfers;A lot of help with bathing/dressing/bathroom;Assistance with cooking/housework;Assist for transportation;Help with stairs or ramp for entrance;Direct supervision/assist for medications management;Direct supervision/assist for financial management   Equipment Recommendations  Rolling walker (2 wheels);BSC/3in1;Wheelchair (measurements  PT);Wheelchair cushion (measurements PT);Hospital bed    Recommendations for Other Services Rehab consult     Precautions / Restrictions Precautions Precautions: Fall Precaution Comments: Lt hemi, poor sitting/standing balance Restrictions Weight Bearing Restrictions: No     Mobility  Bed Mobility               General bed mobility comments: not assessed as patient sitting up on arrival and post session    Transfers Overall transfer level: Needs assistance Equipment used: Rolling walker (2 wheels) Transfers: Sit to/from Stand Sit to Stand: Mod assist           General transfer comment: lifting and lowering assistance provided with standing from recliner chair. cues for sequencing and techniques to facilitate independence    Ambulation/Gait             Pre-gait activities: faciliation for weight shifting to left with decreased weight acceptance and fatigue with standing activity. emphasis on midline and upright posture. patient is able take one step to the left with the left leg while weight shifting to right     Stairs             Wheelchair Mobility    Modified Rankin (Stroke Patients Only)       Balance Overall balance assessment: Needs assistance Sitting-balance support: Feet supported, Bilateral upper extremity supported Sitting balance-Leahy Scale: Poor   Postural control: Left lateral lean Standing balance support: Bilateral upper extremity supported, During functional activity, Reliant on assistive device for balance Standing balance-Leahy Scale: Poor Standing balance comment: external support required to maintain standing balance                            Cognition Arousal/Alertness: Awake/alert Behavior During Therapy: WFL for tasks assessed/performed Overall Cognitive Status: Impaired/Different from baseline Area of Impairment:  Attention, Memory, Following commands, Safety/judgement, Awareness, Problem solving                    Current Attention Level: Selective Memory: Decreased recall of precautions, Decreased short-term memory Following Commands: Follows one step commands with increased time Safety/Judgement: Decreased awareness of safety, Decreased awareness of deficits Awareness: Emergent Problem Solving: Slow processing, Difficulty sequencing, Requires verbal cues, Requires tactile cues, Decreased initiation General Comments: difficulty sequencing with increased time required        Exercises General Exercises - Lower Extremity Long Arc Quad: AROM, AAROM, Strengthening, Left, 5 reps, Supine Straight Leg Raises: AAROM, Strengthening, Left, 5 reps, Supine, AROM Hip Flexion/Marching: AAROM, Strengthening, Left, 5 reps, Supine, AROM Other Exercises Other Exercises: patient able to complete each exercise initially with AROM but then requqired AAROM with further repetitions due to fatigue.    General Comments General comments (skin integrity, edema, etc.): dyspnea with exertion with vitals stable throughout session      Pertinent Vitals/Pain Pain Assessment Pain Assessment: Faces Faces Pain Scale: Hurts little more Pain Location: L knee Pain Descriptors / Indicators: Sore Pain Intervention(s): Limited activity within patient's tolerance, Monitored during session, Repositioned    Home Living Family/patient expects to be discharged to:: Inpatient rehab Living Arrangements: Spouse/significant other;Children;Other relatives Available Help at Discharge: Family Type of Home: House Home Access: Stairs to enter Entrance Stairs-Rails: None Entrance Stairs-Number of Steps: 2   Home Layout: Multi-level;Laundry or work area in basement;Full bath on main level;Able to live on main level with bedroom/bathroom Home Equipment: Wheelchair - power;Cane - single point      Prior Function            PT Goals (current goals can now be found in the care plan section) Acute Rehab PT  Goals Patient Stated Goal: to get stronger PT Goal Formulation: With patient Time For Goal Achievement: 07/31/22 Potential to Achieve Goals: Fair Progress towards PT goals: Progressing toward goals    Frequency    Min 4X/week      PT Plan Current plan remains appropriate    Co-evaluation              AM-PAC PT "6 Clicks" Mobility   Outcome Measure  Help needed turning from your back to your side while in a flat bed without using bedrails?: A Little Help needed moving from lying on your back to sitting on the side of a flat bed without using bedrails?: A Lot Help needed moving to and from a bed to a chair (including a wheelchair)?: A Lot Help needed standing up from a chair using your arms (e.g., wheelchair or bedside chair)?: A Lot Help needed to walk in hospital room?: Total Help needed climbing 3-5 steps with a railing? : Total 6 Click Score: 11    End of Session Equipment Utilized During Treatment: Gait belt Activity Tolerance: Patient tolerated treatment well Patient left: in chair;with call bell/phone within reach;with chair alarm set   PT Visit Diagnosis: Other abnormalities of gait and mobility (R26.89);Muscle weakness (generalized) (M62.81);Hemiplegia and hemiparesis Hemiplegia - Right/Left: Left Hemiplegia - dominant/non-dominant: Non-dominant Hemiplegia - caused by: Unspecified     Time: 1610-9604 PT Time Calculation (min) (ACUTE ONLY): 16 min  Charges:  $Therapeutic Activity: 8-22 mins                     Donna Bernard, PT, MPT    Ina Homes 07/18/2022, 11:44 AM

## 2022-07-18 NOTE — Progress Notes (Signed)
Overnight event  Notified by RN that patient complained of right-sided chest pain and was given morphine 1 mg about 2 hours ago after which he stated that his chest pain has worsened and he is now feeling it across his entire chest.  Current vital signs: Temperature 98.9 F, heart rate 102, respiratory rate 32, blood pressure 143/102, and SpO2 94% on room air.  Chart reviewed, 62 year old male with past medical history of CVA, paroxysmal A-fib, asthma, HIV/AIDS noncompliant with meds, history of PE admitted on 6/8 for acute extensive bilateral PE and right lower extremity DVT.  He was initially treated with IV heparin and transition to Eliquis on 6/10.  Requiring 2-3 L supplemental oxygen.  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  Patient was supposed to be discharged to [facility] today.  -Chest x-ray done and showing shallow inspiration with linear atelectasis in the lungs. -EKG done and showing sinus tachycardia with frequent PVCs. -Troponin x 1 negative, repeat pending -Patient was given IV Dilaudid 1 mg after which his chest pain subsided and tachypnea resolved. -Continue to monitor closely, discharge canceled for tonight

## 2022-07-18 NOTE — Discharge Summary (Signed)
Jose Lamb ZOX:[id number] DOB: 1960/11/08 DOA: 07/15/2022  PCP: [name], [name]  Admit date: 07/15/2022  Discharge date: 07/18/2022  Admitted From: CIR   Disposition:  CIR   Recommendations for Outpatient Follow-up:   Follow up with PCP in 1-2 weeks  PCP Please obtain BMP/CBC, 2 view CXR in 1week,  (see Discharge instructions)   PCP Please follow up on the following pending results:    Home Health: None   Equipment/Devices: None  Consultations: None  Discharge Condition: Stable    CODE STATUS: Full    Diet Recommendation: Heart Healthy   CC - SOB  Brief history of present illness from the day of admission and additional interim summary     62 y.o. male with medical history significant for prior CVA, paroxysmal atrial fibrillation not on oral anticoagulation, asthma, HIV with noncompliance, history of pulmonary embolism, who presents to [facility] ED with complaints of 6 weeks of left-sided weakness involving his left arm and left leg.  Associated with frequent falls.  Has not been taking his ART medications.  MRI of the brain showing right-sided T2 hypertense signal on the MRI primarily cortical/subcortical, in the right greater than left insula and postcentral gyrus, neurology and ID were consulted and he was admitted to the hospital for further care.   Patient had improved was transferred to CIR on 07/14/2022 subsequently at [facility] he developed some chest discomfort after which she was found to have a large PE on CTA and transferred back to [facility] acute care side.  Note patient has previous history of PE as well but had been off anticoagulation for several years, he was on heparin prophylactic dose while he was in the hospital.                                                                  Hospital Course    Acute PE and right lower extremity DVT.  History of PE in the past, was not on chronic anticoagulation anymore question compliance being the issue, he is currently on heparin drip, hemodynamically stable and currently symptom-free, troponin negative, stable echocardiogram. Continue to monitor on heparin drip for another 24 hours, start Eliquis on 07/17/2022.  Continue to encourage sitting in chair using I-S and flutter valve for pulmonary toiletry in the daytime, continue supplemental oxygen 2 to 3 L for the next few weeks on a as needed basis.  Clinically stable for discharge back to CIR.     Left-sided weakness leg significantly more weak than the arm PML likely, positive CSF for JC Virus PCR .  MRI of the brain suggestive of T2 hypertense signal primarily cortical/subcortical, in the right greater than left insula and postcentral gyrus, neurology and ID on board, he was noncompliant with his HIV medications.  Could have PML,  CSF positive for HSV-2 PCR,  started on IV acyclovir >> Valtrex with stop date 07/22/2022, indefinite Bactrim & Bictarvi, per ID, also noted serum RPR positive - but negative T.Pallidium PCR, defer further management to ID and neurology both on board - follow with ID in 2 weeks.  Continue supportive care with PT OT.  Left lower extremity weakness much improved since 07/12/2022, left upper extremity now minimally weak only.  Was discharged to CIR.      HIV - AIDs.  CD4 count less than 54 with CD4 helper T-cell percentage 5%.  Noncompliant with medications, counseled on compliance, ID on board defer management to ID.  He is now agreeable to taking his HIV medications as prescribed, see above.   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  Per ID requested ENT input, per ENT CT guided biopsy by IR if required or else outpatient ENT follow-up.  Discussed with Dr. Murlean Hark and ID physician Dr. Gwynn Burly.  He is s/p CT-guided biopsy of both parotid glands  by IR on 07/10/2022, bilateral parotid gland FNAC done by IR appears unremarkable, prelim results noted on 07/12/2022, CSF autoimmune panel appears negative, CSF flow cytometry appears stable as well.   Dyslipidemia.  Placed on statin.   Asthma.  Stable no acute issues.   Obesity.  BMI 32.  Follow-up with PCP for weight loss.   Blood culture 1 out of 2 specimens positive for Staph species.  Likely contamination.  Monitor.    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  Get CBC, CMP, 2 view Chest X ray -  checked next visit with your primary MD or CIR MD   Activity: As tolerated with Full fall precautions use walker/cane & assistance as needed  Disposition CIR  Diet: Heart Healthy    Special Instructions: If you have smoked or chewed Tobacco  in the last 2 yrs please stop smoking, stop any regular Alcohol  and or any Recreational drug use.  On your next visit with your primary care physician please Get Medicines reviewed and adjusted.  Please request your Prim.MD to go over all Hospital Tests and Procedure/Radiological results at the follow up, please get all Hospital records sent to your Prim MD by signing hospital release before you go home.  If you experience worsening of your admission symptoms, develop shortness of breath, life threatening emergency, suicidal or homicidal thoughts you must seek medical attention immediately by calling 911 or calling your MD immediately  if  symptoms less severe.  You Must read complete instructions/literature along with all the possible adverse reactions/side effects for all the Medicines you take and that have been prescribed to you. Take any new Medicines after you have completely understood and accpet all the possible adverse reactions/side effects.   Increase activity slowly   Complete by: As directed    No wound care   Complete by: As  directed        Discharge Medications   Allergies as of 07/18/2022       Reactions   Asa [aspirin] Other (See Comments)   Epistaxis        Medication List     TAKE these medications    acetaminophen 325 MG tablet Commonly known as: TYLENOL Take 2 tablets (650 mg total) by mouth every 6 (six) hours as needed for mild pain or moderate pain (or Fever >/= 101).   albuterol 108 (90 Base) MCG/ACT inhaler Commonly known as: VENTOLIN HFA 1 puff every 4 (four) hours as needed for wheezing or shortness of breath.   albuterol (2.5 MG/3ML) 0.083% nebulizer solution Commonly known as: PROVENTIL Take 2.5 mg by nebulization every 4 (four) hours.   apixaban 5 MG Tabs tablet Commonly known as: ELIQUIS Take 2 tablets (10 mg total) by mouth 2 (two) times daily for 5 days.   apixaban 5 MG Tabs tablet Commonly known as: ELIQUIS Take 1 tablet (5 mg total) by mouth 2 (two) times daily. Start taking on: July 24, 2022   ascorbic acid 500 MG tablet Commonly known as: VITAMIN C Take 1 tablet (500 mg total) by mouth daily.   Biktarvy 50-200-25 MG Tabs tablet Generic drug: bictegravir-emtricitabine-tenofovir AF Take 1 tablet by mouth daily.   cyanocobalamin 1000 MCG tablet Commonly known as: VITAMIN B12 Take 1 tablet (1,000 mcg total) by mouth daily.   oxyCODONE-acetaminophen 10-325 MG tablet Commonly known as: PERCOCET Take 1 tablet by mouth every 6 (six) hours.   rosuvastatin 10 MG tablet Commonly known as: CRESTOR Take 1 tablet (10 mg total) by mouth daily.    sulfamethoxazole-trimethoprim 800-160 MG tablet Commonly known as: BACTRIM DS Take 1 tablet by mouth daily.   valACYclovir 1000 MG tablet Commonly known as: Valtrex Take 1 tablet (1,000 mg total) by mouth 3 (three) times daily for 9 days.         Follow-up Information     [name], [name]. Schedule an appointment as soon as possible for a visit in 1 week(s).   Specialty: Family Medicine Contact information: [address] [place] [address] [phone]         Daiva Eves, Lisette Grinder, MD. Schedule an appointment as soon as possible for a visit in 2 week(s).   Specialty: Infectious Diseases Contact information: [address] [place] [address] [phone]                 Major procedures and Radiology Reports - PLEASE review detailed and final reports thoroughly  -     ECHOCARDIOGRAM COMPLETE  Result Date: 07/15/2022    ECHOCARDIOGRAM REPORT   Patient Name:   Jose Lamb Date of Exam: 07/15/2022 Medical Rec #:  [id number]      Height:       71.0 in Accession #:    [id number]     Weight:       209.7 lb Date of Birth:  01-10-1961      BSA:          2.151 m Patient Age:    62 years       BP:           135/88 mmHg Patient Gender: M              HR:           96 bpm. Exam Location:  Inpatient Procedure: 2D Echo, Cardiac Doppler and Color Doppler Indications:  Stroke  History:        Patient has no prior history of Echocardiogram examinations,                 most recent 11/27/2019. Arrythmias:Atrial Fibrillation; Risk                 Factors:Hypertension and Diabetes.  Sonographer:    Lucy Antigua Referring Phys: [id number] VISHAL R PATEL  Sonographer Comments: Suboptimal apical window and no subcostal window. Image acquisition challenging due to patient body habitus. Patient complained of subcostal/chest pain. He was not able to depict exactly where the pain was. Wanted to end exam. IMPRESSIONS  1. Left ventricular ejection fraction, by estimation,  is 60 to 65%. The left ventricle has normal function. Left ventricular endocardial border not optimally defined to evaluate regional wall motion. There is mild left ventricular hypertrophy. Left ventricular diastolic parameters are indeterminate.  2. Right ventricular systolic function was not well visualized. The right ventricular size is not well visualized. There is normal pulmonary artery systolic pressure.  3. The mitral valve is normal in structure. No evidence of mitral valve regurgitation. No evidence of mitral stenosis.  4. The tricuspid valve is abnormal.  5. The aortic valve is tricuspid. Aortic valve regurgitation is not visualized. No aortic stenosis is present.  6. Aortic dilatation noted. There is mild dilatation of the aortic root, measuring 40 mm. There is moderate dilatation of the ascending aorta, measuring 47 mm. FINDINGS  Left Ventricle: Left ventricular ejection fraction, by estimation, is 60 to 65%. The left ventricle has normal function. Left ventricular endocardial border not optimally defined to evaluate regional wall motion. The left ventricular internal cavity size was normal in size. There is mild left ventricular hypertrophy. Left ventricular diastolic parameters are indeterminate. Right Ventricle: The right ventricular size is not well visualized. Right vetricular wall thickness was not well visualized. Right ventricular systolic function was not well visualized. There is normal pulmonary artery systolic pressure. The tricuspid regurgitant velocity is 2.69 m/s, and with an assumed right atrial pressure of 3 mmHg, the estimated right ventricular systolic pressure is 31.9 mmHg. Left Atrium: Left atrial size was normal in size. Right Atrium: Right atrial size was normal in size. Pericardium: The pericardium was not well visualized. Mitral Valve: The mitral valve is normal in structure. No evidence of mitral valve regurgitation. No evidence of mitral valve stenosis. Tricuspid Valve: The  tricuspid valve is abnormal. Tricuspid valve regurgitation is mild . No evidence of tricuspid stenosis. Aortic Valve: The aortic valve is tricuspid. Aortic valve regurgitation is not visualized. No aortic stenosis is present. Aortic valve mean gradient measures 4.0 mmHg. Aortic valve peak gradient measures 7.2 mmHg. Aortic valve area, by VTI measures 3.15 cm. Pulmonic Valve: The pulmonic valve was not well visualized. Pulmonic valve regurgitation is trivial. No evidence of pulmonic stenosis. Aorta: Aortic dilatation noted. There is mild dilatation of the aortic root, measuring 40 mm. There is moderate dilatation of the ascending aorta, measuring 47 mm. IAS/Shunts: The interatrial septum was not well visualized.  LEFT VENTRICLE PLAX 2D LVIDd:         3.15 cm   Diastology LVIDs:         2.60 cm   LV e' medial:  10.52 cm/s LV PW:         1.25 cm   LV e' lateral: 8.70 cm/s LV IVS:        1.15 cm LVOT diam:     2.20 cm LV SV:  67 LV SV Index:   31 LVOT Area:     3.80 cm  RIGHT VENTRICLE RV S prime:     20.50 cm/s TAPSE (M-mode): 2.5 cm LEFT ATRIUM             Index        RIGHT ATRIUM           Index LA Vol (A2C):   82.8 ml 38.49 ml/m  RA Area:     20.50 cm LA Vol (A4C):   52.6 ml 24.45 ml/m  RA Volume:   54.50 ml  25.33 ml/m LA Biplane Vol: 68.5 ml 31.84 ml/m  AORTIC VALVE AV Area (Vmax):    2.92 cm AV Area (Vmean):   2.51 cm AV Area (VTI):     3.15 cm AV Vmax:           134.00 cm/s AV Vmean:          99.200 cm/s AV VTI:            0.211 m AV Peak Grad:      7.2 mmHg AV Mean Grad:      4.0 mmHg LVOT Vmax:         103.00 cm/s LVOT Vmean:        65.500 cm/s LVOT VTI:          0.175 m LVOT/AV VTI ratio: 0.83  AORTA Ao Root diam: 4.00 cm Ao Asc diam:  4.70 cm TRICUSPID VALVE TR Peak grad:   28.9 mmHg TR Vmax:        269.00 cm/s  SHUNTS Systemic VTI:  0.18 m Systemic Diam: 2.20 cm Dina Rich MD Electronically signed by Dina Rich MD Signature Date/Time: 07/15/2022/12:43:16 PM    Final    VAS [place]  LOWER EXTREMITY VENOUS (DVT)  Result Date: 07/15/2022  Lower Venous DVT Study Patient Name:  Jose Lamb  Date of Exam:   07/15/2022 Medical Rec #: [id number]       Accession #:    [id number] Date of Birth: 08/15/1960       Patient Gender: M Patient Age:   41 years Exam Location:  [facility] Procedure:      VAS [place] LOWER EXTREMITY VENOUS (DVT) Referring Phys: Eston Esters PATEL --------------------------------------------------------------------------------  Indications: Pulmonary embolism.  Anticoagulation: Heparin. Limitations: Poor ultrasound/tissue interface and body habitus. Comparison Study: No previous study. Performing Technologist: McKayla Maag RVT, VT  Examination Guidelines: A complete evaluation includes B-mode imaging, spectral Doppler, color Doppler, and power Doppler as needed of all accessible portions of each vessel. Bilateral testing is considered an integral part of a complete examination. Limited examinations for reoccurring indications may be performed as noted. The reflux portion of the exam is performed with the patient in reverse Trendelenburg.  +---------+---------------+---------+-----------+----------+--------------+ RIGHT    CompressibilityPhasicitySpontaneityPropertiesThrombus Aging +---------+---------------+---------+-----------+----------+--------------+ CFV      Full           Yes      Yes                                 +---------+---------------+---------+-----------+----------+--------------+ SFJ      Full                                                        +---------+---------------+---------+-----------+----------+--------------+  FV Prox  Full                                                        +---------+---------------+---------+-----------+----------+--------------+ FV Mid   Full                                                        +---------+---------------+---------+-----------+----------+--------------+ FV DistalFull                                                         +---------+---------------+---------+-----------+----------+--------------+ PFV      Full                                                        +---------+---------------+---------+-----------+----------+--------------+ POP      Full           Yes      Yes                                 +---------+---------------+---------+-----------+----------+--------------+ PTV      Full                                                        +---------+---------------+---------+-----------+----------+--------------+ PERO                                                  Not visualized +---------+---------------+---------+-----------+----------+--------------+ Gastroc  None           No       No                   Acute          +---------+---------------+---------+-----------+----------+--------------+   +---------+---------------+---------+-----------+----------+--------------+ LEFT     CompressibilityPhasicitySpontaneityPropertiesThrombus Aging +---------+---------------+---------+-----------+----------+--------------+ CFV      Full           Yes      Yes                                 +---------+---------------+---------+-----------+----------+--------------+ SFJ      Full                                                        +---------+---------------+---------+-----------+----------+--------------+ FV Prox  Full                                                        +---------+---------------+---------+-----------+----------+--------------+ FV Mid   Full                                                        +---------+---------------+---------+-----------+----------+--------------+ FV DistalFull                                                        +---------+---------------+---------+-----------+----------+--------------+ PFV      Full                                                         +---------+---------------+---------+-----------+----------+--------------+ POP      Full           Yes      Yes                                 +---------+---------------+---------+-----------+----------+--------------+ PTV      Full                                                        +---------+---------------+---------+-----------+----------+--------------+ PERO                                                  Not visualized +---------+---------------+---------+-----------+----------+--------------+     Summary: RIGHT: - Findings consistent with acute deep vein thrombosis involving the right gastrocnemius veins. - Portions of this examination were limited- see technologist comments above. - No cystic structure found in the popliteal fossa.  LEFT: - There is no evidence of deep vein thrombosis in the lower extremity. However, portions of this examination were limited- see technologist comments above.  - No cystic structure found in the popliteal fossa.  *See table(s) above for measurements and observations. Electronically signed by Gerarda Fraction on 07/15/2022 at 9:39:10 AM.    Final    CT Angio Chest Pulmonary Embolism (PE) W or WO Contrast  Addendum Date: 07/14/2022   ADDENDUM REPORT: 07/14/2022 23:07 ADDENDUM: Critical Value/emergent results were called by telephone at the time of interpretation on 07/14/2022 at 11:07 pm to provider VISHAL PATEL , who verbally acknowledged these results. Electronically Signed   By: Thornell Sartorius M.D.   On: 07/14/2022 23:07   Result Date: 07/14/2022 CLINICAL DATA:  Pulmonary embolism suspected, high probability. EXAM: CT ANGIOGRAPHY CHEST WITH CONTRAST TECHNIQUE: Multidetector CT imaging of the  chest was performed using the standard protocol during bolus administration of intravenous contrast. Multiplanar CT image reconstructions and MIPs were obtained to evaluate the vascular anatomy. RADIATION DOSE REDUCTION: This exam was performed according to the  departmental dose-optimization program which includes automated exposure control, adjustment of the mA and/or kV according to patient size and/or use of iterative reconstruction technique. CONTRAST:  75mL OMNIPAQUE IOHEXOL 350 MG/ML SOLN COMPARISON:  04/06/2022. FINDINGS: Cardiovascular: Heart is mildly enlarged and there is a trace pericardial effusion. Scattered coronary artery calcifications are present. There is aneurysmal dilatation of the ascending aorta measuring 4.5 cm. The pulmonary trunk is distended suggesting underlying pulmonary artery hypertension. There are bilateral pulmonary emboli involving lobar, segmental, and subsegmental arteries with moderate-to-large thrombus burden. The right ventricle is distended suggesting underlying pulmonary artery hypertension. Mediastinum/Nodes: No mediastinal, hilar, or axillary lymphadenopathy by size criteria. The thyroid gland, trachea, and esophagus are within normal limits. Lungs/Pleura: Patchy airspace disease is noted bilaterally and most pronounced in the lower lobes. No effusion or pneumothorax. Upper Abdomen: No acute abnormality. Musculoskeletal: Old rib fractures are noted on the left. There are degenerative changes in the thoracic spine. No acute fracture. Review of the MIP images confirms the above findings. IMPRESSION: 1. Extensive bilateral pulmonary emboli with moderate-to-large thrombus volume and evidence of right heart strain. 2. Distended pulmonary trunk suggesting underlying pulmonary artery hypertension. 3. Patchy airspace disease in the lungs bilaterally, possible atelectasis or infiltrate. 4. Aneurysmal dilatation of the ascending aorta measuring 4.5 cm. Ascending thoracic aortic aneurysm. Recommend semi-annual imaging followup by CTA or MRA and referral to cardiothoracic surgery if not already obtained. This recommendation follows 2010 ACCF/AHA/AATS/ACR/ASA/SCA/SCAI/SIR/STS/SVM Guidelines for the Diagnosis and Management of Patients With  Thoracic Aortic Disease. Circulation. 2010; 121: B147-W295. Aortic aneurysm NOS (ICD10-I71.9) 5. Cardiomegaly with scattered coronary artery calcifications. Electronically Signed: By: Thornell Sartorius M.D. On: 07/14/2022 23:02   DG CHEST PORT 1 VIEW  Result Date: 07/14/2022 CLINICAL DATA:  Tachypnea EXAM: PORTABLE CHEST 1 VIEW COMPARISON:  07/05/2022 FINDINGS: Lung volumes are small and there is bibasilar atelectasis. No superimposed confluent pulmonary infiltrate. No pneumothorax or pleural effusion. Cardiac size within normal limits. Pulmonary vascularity is normal. No acute bone abnormality. IMPRESSION: 1. Pulmonary hypoinflation. Electronically Signed   By: Helyn Numbers M.D.   On: 07/14/2022 20:45   EEG adult  Result Date: 07/13/2022 Charlsie Quest, MD     07/13/2022 10:05 PM Patient Name: Jose Lamb MRN: [id number] Epilepsy Attending: Charlsie Quest Referring Physician/Provider: Leroy Sea, MD Date: 07/13/2022 Duration: 23.28 mins Patient history:  62 year old male with a 6-week history of increasing left-sided weakness and hemisensory deficits getting eeg to evaluate for seizure. Level of alertness: Awake, asleep AEDs during EEG study: None Technical aspects: This EEG study was done with scalp electrodes positioned according to the 10-20 International system of electrode placement. Electrical activity was reviewed with band pass filter of 1-70Hz , sensitivity of 7 uV/mm, display speed of 28mm/sec with a 60Hz  notched filter applied as appropriate. EEG data were recorded continuously and digitally stored.  Video monitoring was available and reviewed as appropriate. Description: The posterior dominant rhythm consists of 9 Hz activity of moderate voltage (25-35 uV) seen predominantly in posterior head regions, symmetric and reactive to eye opening and eye closing. Sleep was characterized by vertex waves, sleep spindles (12 to 14 Hz), maximal frontocentral region. Physiologic photic driving was seen  during photic stimulation.  Hyperventilation was not performed.   IMPRESSION: This study is within normal limits. No seizures  or epileptiform discharges were seen throughout the recording. A normal interictal EEG does not exclude the diagnosis of epilepsy. Priyanka Annabelle Harman   [place] FNA SALIVARY GLAND/PAROTID GLAND  Result Date: 07/11/2022 INDICATION: Indeterminate bilateral parotid cysts. Please perform ultrasound-guided aspiration for tissue diagnostic purposes. EXAM: 1. ULTRASOUND-GUIDED FINE-NEEDLE ASPIRATION OF RIGHT PAROTID CYST 2. ULTRASOUND-GUIDED FINE-NEEDLE ASPIRATION OF THE LEFT PAROTID CYST COMPARISON:  Brain MRI-06/27/2022; cervical spine CT-01/30/2021 MEDICATIONS: None ANESTHESIA/SEDATION: None CONTRAST:  None COMPLICATIONS: None immediate. PROCEDURE: Informed written consent was obtained from the patient after a discussion of the risks, benefits and alternatives to treatment. Preprocedural ultrasound scanning demonstrated an approximately 4.4 x 2.6 cm minimally complex right-sided parotid cyst (image 4), as well as an approximately 3.3 x 2.0 cm left-sided parotid cyst (image 8). A timeout was performed prior to the initiation of the procedure. The skin overlying the operative sites were prepped and draped in usual sterile fashion. Attention was first paid towards aspiration of the right-sided parotid cyst. After the overlying soft tissues were anesthetized with 1% lidocaine with epinephrine, an 18 gauge trocar needle was advanced into the cyst. Multiple ultrasound images were saved procedural documentation purposes. Next, a proximally 25 cc of tan colored fluid was aspirated from the right parotid cyst. Postprocedural imaging was obtained demonstrating near complete resolution of the right-sided parotid cyst. The identical procedure was then performed left-sided parotid cyst yielding 10 cc of similar appearing tan colored fluid. Postprocedural imaging was obtained demonstrating near complete resolution  of the left-sided parotid cyst. All aspirated fluid was capped and sent separately to the laboratory for analysis. Dressings were applied. The patient tolerated the procedure well without immediate postprocedural complication. IMPRESSION: 1. Successful ultrasound-guided aspiration of 25 cc of tan colored fluid from the right-sided parotid cyst. 2. Successful ultrasound-guided aspiration of 10 cc of tan colored fluid from the left-sided parotid cyst. 3. All aspirated fluid was capped and sent separately to the laboratory for analysis. Electronically Signed   By: Simonne Come M.D.   On: 07/11/2022 10:36   [place] FNA BIOPSY SALIVARY GLAND PAROTID GLAND EA ADDT'L LESION  Result Date: 07/11/2022 INDICATION: Indeterminate bilateral parotid cysts. Please perform ultrasound-guided aspiration for tissue diagnostic purposes. EXAM: 1. ULTRASOUND-GUIDED FINE-NEEDLE ASPIRATION OF RIGHT PAROTID CYST 2. ULTRASOUND-GUIDED FINE-NEEDLE ASPIRATION OF THE LEFT PAROTID CYST COMPARISON:  Brain MRI-06/27/2022; cervical spine CT-01/30/2021 MEDICATIONS: None ANESTHESIA/SEDATION: None CONTRAST:  None COMPLICATIONS: None immediate. PROCEDURE: Informed written consent was obtained from the patient after a discussion of the risks, benefits and alternatives to treatment. Preprocedural ultrasound scanning demonstrated an approximately 4.4 x 2.6 cm minimally complex right-sided parotid cyst (image 4), as well as an approximately 3.3 x 2.0 cm left-sided parotid cyst (image 8). A timeout was performed prior to the initiation of the procedure. The skin overlying the operative sites were prepped and draped in usual sterile fashion. Attention was first paid towards aspiration of the right-sided parotid cyst. After the overlying soft tissues were anesthetized with 1% lidocaine with epinephrine, an 18 gauge trocar needle was advanced into the cyst. Multiple ultrasound images were saved procedural documentation purposes. Next, a proximally 25 cc of tan  colored fluid was aspirated from the right parotid cyst. Postprocedural imaging was obtained demonstrating near complete resolution of the right-sided parotid cyst. The identical procedure was then performed left-sided parotid cyst yielding 10 cc of similar appearing tan colored fluid. Postprocedural imaging was obtained demonstrating near complete resolution of the left-sided parotid cyst. All aspirated fluid was capped and sent separately to the laboratory for  analysis. Dressings were applied. The patient tolerated the procedure well without immediate postprocedural complication. IMPRESSION: 1. Successful ultrasound-guided aspiration of 25 cc of tan colored fluid from the right-sided parotid cyst. 2. Successful ultrasound-guided aspiration of 10 cc of tan colored fluid from the left-sided parotid cyst. 3. All aspirated fluid was capped and sent separately to the laboratory for analysis. Electronically Signed   By: Simonne Come M.D.   On: 07/11/2022 10:36   DG Lumbar Puncture Fluoro Guide  Result Date: 07/09/2022 CLINICAL DATA:  Left-sided weakness with concern for encephalitis. Request for image guided lumbar puncture. EXAM: LUMBAR PUNCTURE UNDER FLUOROSCOPY PROCEDURE: An appropriate skin entry site was determined fluoroscopically. Operator donned sterile gloves and mask. Skin site was marked, then prepped with Betadine, draped in usual sterile fashion, and infiltrated locally with 1% lidocaine. A 6 inch, 20 gauge spinal needle advanced into the thecal sac at L3-4 from a right interlaminar approach. Clear colorless CSF spontaneously returned, with opening pressure of 25 cm water. Approximately 13 ml CSF were collected and divided among 4 sterile vials for the requested laboratory studies. The needle was then removed. The patient tolerated the procedure well and there were no complications. FLUOROSCOPY: Radiation Exposure Index (as provided by the fluoroscopic device): 6.5 mGy Kerma IMPRESSION: Technically  successful lumbar puncture under fluoroscopy. This exam was performed by Corrin Parker, [name], and was supervised and interpreted by Dr. Marliss Coots. Electronically Signed   By: Marliss Coots M.D.   On: 07/09/2022 18:03   MR BRAIN W CONTRAST  Result Date: 07/08/2022 CLINICAL DATA:  Headache, no red flags EXAM: MRI HEAD WITH CONTRAST TECHNIQUE: Multiplanar, multiecho pulse sequences of the brain and surrounding structures were obtained with intravenous contrast. CONTRAST:  10mL GADAVIST GADOBUTROL 1 MMOL/ML IV SOLN COMPARISON:  MRI head 07/07/2022 FINDINGS: Axial T1 pre and postcontrast, coronal T1 postcontrast, and coronal T2 sequences obtained. No abnormal parenchymal or meningeal enhancement. Redemonstrated areas of T1 hyperintense signal in the right greater than left insula and frontoparietal region. IMPRESSION: No abnormal parenchymal or meningeal enhancement. Electronically Signed   By: Wiliam Ke M.D.   On: 07/08/2022 20:32   MR BRAIN WO CONTRAST  Result Date: 07/07/2022 CLINICAL DATA:  Transient ischemic attack EXAM: MRI HEAD WITHOUT CONTRAST TECHNIQUE: Multiplanar, multiecho pulse sequences of the brain and surrounding structures were obtained without intravenous contrast. COMPARISON:  None Available. FINDINGS: Brain: Increased T2 hyperintense signal along the right-greater-than-left insula (series 9, image 27), which is associated with mildly increased signal on diffusion-weighted imaging, with decreased intensity on the ADC map. On the right, this is associated with a more focal area of T1 and T2 hyperintense signal, with surrounding edema (series 8, image 18, series 9, image 35 and series 12, image 111). T2 hyperintense signal is also noted in right thalamus, extending into the midbrain (series 4, images 24-25), although this could be related to wallerian degeneration. Similar abnormal cortical signal is noted in the right postcentral gyrus (series 9, image 46), which is associated with a similar  T2 hyperintense, T1 hypointense area with peripheral edema in the right frontoparietal region (series 9, image 39). Less significant T2 hyperintense signal is noted in the left postcentral gyrus (series 9, image 36), without the more superior cortical involvement. Increased T2 signal in the splenium of the corpus callosum (series 9, image 31) and in the left body (series 9, image 35), which is not definitively associated with restricted diffusion. No acute hemorrhage, mass, mass effect, or midline shift. No hydrocephalus or extra-axial  collection. Partial empty sella. Craniocervical junction. No hemosiderin deposition to suggest remote hemorrhage. Vascular: Normal arterial flow voids. Skull and upper cervical spine: Normal marrow signal. Sinuses/Orbits: Mucosal thickening left maxillary sinus. No acute finding in the orbits. Other: The mastoid air cells are well aerated. Bilateral parotid masses are better seen on the prior CT and are incompletely imaged on this exam IMPRESSION: 1. Abnormal T2 hyperintense signal, primarily cortical/subcortical, in the right greater than left insula and postcentral gyrus, as well as in the splenium and left body of the corpus callosum and possibly the right thalamus and midbrain. This is nonspecific, and while this could reflect sequela of prior infarcts, it is also concerning for an inflammatory or infectious process, such as PML, autoimmune encephalitis, or herpes simplex encephalitis, although this is not particularly typical of any the entities. MRI with contrast could be helpful. Correlate with symptoms and consider CSF testing. 2. Bilateral parotid masses are better seen on the prior CT and are incompletely imaged on this exam. These results were called by telephone at the time of interpretation on 07/07/2022 at 10:30 pm to provider [facility] , who verbally acknowledged these results. Electronically Signed   By: Wiliam Ke M.D.   On: 07/07/2022 22:39   DG Chest Port 1  View  Result Date: 07/05/2022 CLINICAL DATA:  Questionable sepsis - evaluate for abnormality EXAM: PORTABLE CHEST 1 VIEW COMPARISON:  April 05, 2022. FINDINGS: Similar enlarged cardiac silhouette. Pulmonary vascular congestion. No overt pulmonary edema. No consolidation. No visible pleural effusions or pneumothorax. Polyarticular degenerative change. IMPRESSION: Similar cardiomegaly and pulmonary vascular congestion without overt pulmonary edema. Electronically Signed   By: Feliberto Harts M.D.   On: 07/05/2022 09:54    Micro Results    Recent Results (from the past 240 hour(s))  CSF culture w Gram Stain     Status: None   Collection Time: 07/08/22 12:30 PM   Specimen: CSF; Cerebrospinal Fluid  Result Value Ref Range Status   Specimen Description   Final    CSF Performed at [facility], [address], [place], [place] [address]    Special Requests   Final    NONE Performed at [facility], [address] [place] [address] [place], [place] [address]    Gram Stain   Final    WBC PRESENT,BOTH PMN AND MONONUCLEAR NO ORGANISMS SEEN CYTOSPIN Gram Stain Report Called to,Read Back By and Verified With: Derl Barrow RN @1425  ON 6.1.2024 BY [facility] Performed at [facility], [address] [place], [place] [address]    Culture   Final    NO GROWTH 3 DAYS Performed at [facility], [address] [place], [place] [address]    Report Status 07/12/2022 FINAL  Final  SARS Coronavirus 2 by RT PCR (hospital order, performed in [facility] hospital lab) *cepheid single result test* Anterior Nasal Swab     Status: None   Collection Time: 07/14/22  8:25 PM   Specimen: Anterior Nasal Swab  Result Value Ref Range Status   SARS Coronavirus 2 by RT PCR NEGATIVE NEGATIVE Final    Comment: Performed at [facility], [address], [place], [place] [address]  Expectorated Sputum Assessment w Gram Stain, Rflx to Resp Cult     Status: None    Collection Time: 07/14/22  8:27 PM   Specimen: Expectorated Sputum  Result Value Ref Range Status   Specimen Description EXPECTORATED SPUTUM  Final   Special Requests Immunocompromised  Final   Sputum evaluation   Final    Sputum specimen not acceptable for testing.  Please recollect.   Gram Stain Report Called to,Read Back By and Verified With: RN Roe Coombs [phone] @ 2248 FH Performed at [facility], [address] [place], [place] [address]    Report Status 07/14/2022 FINAL  Final  Culture, blood (Routine X 2) w Reflex to ID Panel     Status: Abnormal   Collection Time: 07/14/22  8:55 PM   Specimen: BLOOD LEFT ARM  Result Value Ref Range Status   Specimen Description BLOOD LEFT ARM  Final   Special Requests   Final    BOTTLES DRAWN AEROBIC AND ANAEROBIC Blood Culture adequate volume   Culture  Setup Time   Final    GRAM POSITIVE COCCI AEROBIC BOTTLE ONLY CRITICAL RESULT CALLED TO, READ BACK BY AND VERIFIED WITH: PHARMD KIM HURTH [id number] AT 1414 BY EC    Culture (A)  Final    STAPHYLOCOCCUS HOMINIS THE SIGNIFICANCE OF ISOLATING THIS ORGANISM FROM A SINGLE SET OF BLOOD CULTURES WHEN MULTIPLE SETS ARE DRAWN IS UNCERTAIN. PLEASE NOTIFY THE MICROBIOLOGY DEPARTMENT WITHIN ONE WEEK IF SPECIATION AND SENSITIVITIES ARE REQUIRED. Performed at [facility], [address], [place], [place] [address]    Report Status 07/16/2022 FINAL  Final  Blood Culture ID Panel (Reflexed)     Status: Abnormal   Collection Time: 07/14/22  8:55 PM  Result Value Ref Range Status   Enterococcus faecalis NOT DETECTED NOT DETECTED Final   Enterococcus Faecium NOT DETECTED NOT DETECTED Final   Listeria monocytogenes NOT DETECTED NOT DETECTED Final   Staphylococcus species DETECTED (A) NOT DETECTED Final    Comment: CRITICAL RESULT CALLED TO, READ BACK BY AND VERIFIED WITH: PHARMD Malva Cogan [id number] AT 1614 BY EC    Staphylococcus aureus (BCID) NOT DETECTED NOT DETECTED Final   Staphylococcus  epidermidis NOT DETECTED NOT DETECTED Final   Staphylococcus lugdunensis NOT DETECTED NOT DETECTED Final   Streptococcus species NOT DETECTED NOT DETECTED Final   Streptococcus agalactiae NOT DETECTED NOT DETECTED Final   Streptococcus pneumoniae NOT DETECTED NOT DETECTED Final   Streptococcus pyogenes NOT DETECTED NOT DETECTED Final   A.calcoaceticus-baumannii NOT DETECTED NOT DETECTED Final   Bacteroides fragilis NOT DETECTED NOT DETECTED Final   Enterobacterales NOT DETECTED NOT DETECTED Final   Enterobacter cloacae complex NOT DETECTED NOT DETECTED Final   Escherichia coli NOT DETECTED NOT DETECTED Final   Klebsiella aerogenes NOT DETECTED NOT DETECTED Final   Klebsiella oxytoca NOT DETECTED NOT DETECTED Final   Klebsiella pneumoniae NOT DETECTED NOT DETECTED Final   Proteus species NOT DETECTED NOT DETECTED Final   Salmonella species NOT DETECTED NOT DETECTED Final   Serratia marcescens NOT DETECTED NOT DETECTED Final   Haemophilus influenzae NOT DETECTED NOT DETECTED Final   Neisseria meningitidis NOT DETECTED NOT DETECTED Final   Pseudomonas aeruginosa NOT DETECTED NOT DETECTED Final   Stenotrophomonas maltophilia NOT DETECTED NOT DETECTED Final   Candida albicans NOT DETECTED NOT DETECTED Final   Candida auris NOT DETECTED NOT DETECTED Final   Candida glabrata NOT DETECTED NOT DETECTED Final   Candida krusei NOT DETECTED NOT DETECTED Final   Candida parapsilosis NOT DETECTED NOT DETECTED Final   Candida tropicalis NOT DETECTED NOT DETECTED Final   Cryptococcus neoformans/gattii NOT DETECTED NOT DETECTED Final    Comment: Performed at [facility], [address], [place], [place] [address]  Culture, blood (Routine X 2)  w Reflex to ID Panel     Status: None (Preliminary result)   Collection Time: 07/14/22  9:06 PM   Specimen: BLOOD RIGHT ARM  Result Value Ref Range Status   Specimen Description BLOOD RIGHT ARM  Final   Special Requests   Final    BOTTLES DRAWN AEROBIC  AND ANAEROBIC Blood Culture adequate volume   Culture   Final    NO GROWTH 3 DAYS Performed at [facility], [address], [place], [place] [address]    Report Status PENDING  Incomplete    Today   Subjective    Jaelin Callo today has no headache, no abdominal pain,no new weakness tingling or numbness, feels much better wants to go home today.  Improving left-sided weakness.  Right-sided pleuritic chest pain improved, minimal shortness of breath.   Objective   Blood pressure 131/67, pulse 73, temperature 97.8 F (36.6 C), resp. rate (!) 22, weight 96.4 kg, SpO2 97 %.   Intake/Output Summary (Last 24 hours) at 07/18/2022 0800 Last data filed at 07/17/2022 2300 Gross per 24 hour  Intake 483 ml  Output 1050 ml  Net -567 ml    Exam  Awake Alert, No new F.N deficits, improving minimal left-sided weakness. Logan.AT,PERRAL Supple Neck,   Symmetrical Chest wall movement, Good air movement bilaterally, CTAB RRR,No Gallops,   +ve B.Sounds, Abd Soft, Non tender,  No Cyanosis, Clubbing or edema    Data Review   Recent Labs  Lab 07/14/22 2055 07/15/22 0127 07/16/22 0426 07/17/22 0453 07/18/22 0345  WBC 5.4 5.5 4.8 3.7* 3.9*  HGB 13.1 13.4 12.1* 12.0* 12.3*  HCT 39.4 40.0 35.9* 36.7* 37.2*  PLT 116* 114* 136* 169 206  MCV 86.8 87.5 87.8 87.8 88.8  MCH 28.9 29.3 29.6 28.7 29.4  MCHC 33.2 33.5 33.7 32.7 33.1  RDW 13.3 13.4 13.2 13.2 13.3  LYMPHSABS 1.1 1.1 0.8 0.9 1.3  MONOABS 0.5 0.4 0.5 0.4 0.5  EOSABS 0.0 0.1 0.1 0.2 0.2  BASOSABS 0.0 0.0 0.0 0.0 0.0    Recent Labs  Lab 07/13/22 0709 07/14/22 0622 07/14/22 2055 07/15/22 0127 07/16/22 0426 07/17/22 0453 07/18/22 0345  NA 137 135 136 135 137 137 138  K 3.7 3.5 3.6 3.6 3.6 3.6 3.6  CL 104 107 105 103 102 103 104  CO2 24 20* 18* 21* 20* 24 24  ANIONGAP 9 8 13 11 15 10 10   GLUCOSE 99 106* 118* 118* 101* 97 104*  BUN 7 8 10 10 13 12 13   CREATININE 0.89 0.81 0.90 0.92 0.84 0.99 0.95  AST  --   --  14*  16 14* 14* 14*  ALT  --   --  16 18 14 12 14   ALKPHOS  --   --  62 65 57 60 56  BILITOT  --   --  0.8 0.8 0.7 0.2* 0.5  ALBUMIN  --   --  3.3* 3.3* 2.9* 2.9* 2.9*  PROCALCITON  --   --  <0.10  --   --   --   --   LATICACIDVEN  --   --  0.9  --   --   --   --   BNP 57.0 49.1  --   --  65.7 35.1 29.5  MG 2.1 2.1  --   --  2.1 2.1 2.0  CALCIUM 8.5* 8.6* 8.8* 8.9 9.0 9.0 9.3    Total Time in preparing paper work, data evaluation and todays exam -  35 minutes  Signature  -    Susa Raring M.D on 07/18/2022 at 8:00 AM   -  To page go to [email or web address]

## 2022-07-18 NOTE — Progress Notes (Addendum)
Inpatient Rehab Admissions Coordinator:   Will restart insurance auth request today.  Updated spouse on the phone.    1421: Stopped by pts room, sleeping soundly and I did not wake.  I did receive insurance approval and can plan for return to CIR potentially tomorrow, pending bed availability.   Estill Dooms, PT, DPT Admissions Coordinator [phone] 07/18/22  10:08 AM

## 2022-07-19 ENCOUNTER — Encounter (HOSPITAL_COMMUNITY): Payer: Self-pay | Admitting: Physical Medicine and Rehabilitation

## 2022-07-19 ENCOUNTER — Inpatient Hospital Stay (HOSPITAL_COMMUNITY)
Admission: RE | Admit: 2022-07-19 | Discharge: 2022-08-14 | DRG: 884 | Disposition: A | Discharge status: Home / Self Care | Payer: BC Managed Care – PPO | Source: Intra-hospital | Attending: Physical Medicine and Rehabilitation | Admitting: Physical Medicine and Rehabilitation

## 2022-07-19 ENCOUNTER — Other Ambulatory Visit: Payer: Self-pay

## 2022-07-19 DIAGNOSIS — Z6832 Body mass index (BMI) 32.0-32.9, adult: Secondary | ICD-10-CM | POA: Diagnosis not present

## 2022-07-19 DIAGNOSIS — E871 Hypo-osmolality and hyponatremia: Secondary | ICD-10-CM | POA: Diagnosis present

## 2022-07-19 DIAGNOSIS — G8194 Hemiplegia, unspecified affecting left nondominant side: Secondary | ICD-10-CM | POA: Diagnosis not present

## 2022-07-19 DIAGNOSIS — Z9181 History of falling: Secondary | ICD-10-CM

## 2022-07-19 DIAGNOSIS — I82401 Acute embolism and thrombosis of unspecified deep veins of right lower extremity: Secondary | ICD-10-CM | POA: Diagnosis present

## 2022-07-19 DIAGNOSIS — F02B3 Dementia in other diseases classified elsewhere, moderate, with mood disturbance: Secondary | ICD-10-CM | POA: Diagnosis not present

## 2022-07-19 DIAGNOSIS — R32 Unspecified urinary incontinence: Secondary | ICD-10-CM | POA: Diagnosis present

## 2022-07-19 DIAGNOSIS — R1110 Vomiting, unspecified: Secondary | ICD-10-CM | POA: Diagnosis not present

## 2022-07-19 DIAGNOSIS — R079 Chest pain, unspecified: Secondary | ICD-10-CM | POA: Diagnosis present

## 2022-07-19 DIAGNOSIS — R1310 Dysphagia, unspecified: Secondary | ICD-10-CM | POA: Diagnosis not present

## 2022-07-19 DIAGNOSIS — R4182 Altered mental status, unspecified: Secondary | ICD-10-CM | POA: Diagnosis not present

## 2022-07-19 DIAGNOSIS — I10 Essential (primary) hypertension: Secondary | ICD-10-CM | POA: Diagnosis present

## 2022-07-19 DIAGNOSIS — R4189 Other symptoms and signs involving cognitive functions and awareness: Principal | ICD-10-CM | POA: Diagnosis present

## 2022-07-19 DIAGNOSIS — G8114 Spastic hemiplegia affecting left nondominant side: Secondary | ICD-10-CM | POA: Diagnosis not present

## 2022-07-19 DIAGNOSIS — D649 Anemia, unspecified: Secondary | ICD-10-CM | POA: Diagnosis present

## 2022-07-19 DIAGNOSIS — I2699 Other pulmonary embolism without acute cor pulmonale: Secondary | ICD-10-CM | POA: Diagnosis present

## 2022-07-19 DIAGNOSIS — M25552 Pain in left hip: Secondary | ICD-10-CM | POA: Diagnosis not present

## 2022-07-19 DIAGNOSIS — G9341 Metabolic encephalopathy: Secondary | ICD-10-CM | POA: Diagnosis present

## 2022-07-19 DIAGNOSIS — D6832 Hemorrhagic disorder due to extrinsic circulating anticoagulants: Secondary | ICD-10-CM | POA: Diagnosis present

## 2022-07-19 DIAGNOSIS — R414 Neurologic neglect syndrome: Secondary | ICD-10-CM | POA: Diagnosis not present

## 2022-07-19 DIAGNOSIS — R0902 Hypoxemia: Secondary | ICD-10-CM | POA: Diagnosis not present

## 2022-07-19 DIAGNOSIS — N179 Acute kidney failure, unspecified: Secondary | ICD-10-CM | POA: Diagnosis not present

## 2022-07-19 DIAGNOSIS — G928 Other toxic encephalopathy: Secondary | ICD-10-CM | POA: Diagnosis present

## 2022-07-19 DIAGNOSIS — Z8616 Personal history of COVID-19: Secondary | ICD-10-CM | POA: Diagnosis not present

## 2022-07-19 DIAGNOSIS — H9191 Unspecified hearing loss, right ear: Secondary | ICD-10-CM | POA: Diagnosis not present

## 2022-07-19 DIAGNOSIS — K6389 Other specified diseases of intestine: Secondary | ICD-10-CM | POA: Diagnosis not present

## 2022-07-19 DIAGNOSIS — L89152 Pressure ulcer of sacral region, stage 2: Secondary | ICD-10-CM | POA: Diagnosis not present

## 2022-07-19 DIAGNOSIS — I480 Paroxysmal atrial fibrillation: Secondary | ICD-10-CM | POA: Diagnosis not present

## 2022-07-19 DIAGNOSIS — Z515 Encounter for palliative care: Secondary | ICD-10-CM

## 2022-07-19 DIAGNOSIS — F0280 Dementia in other diseases classified elsewhere without behavioral disturbance: Secondary | ICD-10-CM | POA: Diagnosis not present

## 2022-07-19 DIAGNOSIS — J45909 Unspecified asthma, uncomplicated: Secondary | ICD-10-CM | POA: Diagnosis present

## 2022-07-19 DIAGNOSIS — A812 Progressive multifocal leukoencephalopathy: Secondary | ICD-10-CM | POA: Diagnosis present

## 2022-07-19 DIAGNOSIS — R319 Hematuria, unspecified: Secondary | ICD-10-CM | POA: Diagnosis present

## 2022-07-19 DIAGNOSIS — Z66 Do not resuscitate: Secondary | ICD-10-CM | POA: Diagnosis not present

## 2022-07-19 DIAGNOSIS — G629 Polyneuropathy, unspecified: Secondary | ICD-10-CM | POA: Diagnosis present

## 2022-07-19 DIAGNOSIS — E222 Syndrome of inappropriate secretion of antidiuretic hormone: Secondary | ICD-10-CM | POA: Diagnosis not present

## 2022-07-19 DIAGNOSIS — T45515A Adverse effect of anticoagulants, initial encounter: Secondary | ICD-10-CM | POA: Diagnosis present

## 2022-07-19 DIAGNOSIS — I878 Other specified disorders of veins: Secondary | ICD-10-CM | POA: Diagnosis not present

## 2022-07-19 DIAGNOSIS — K5900 Constipation, unspecified: Secondary | ICD-10-CM | POA: Diagnosis present

## 2022-07-19 DIAGNOSIS — E119 Type 2 diabetes mellitus without complications: Secondary | ICD-10-CM | POA: Diagnosis not present

## 2022-07-19 DIAGNOSIS — Z743 Need for continuous supervision: Secondary | ICD-10-CM | POA: Diagnosis not present

## 2022-07-19 DIAGNOSIS — E8809 Other disorders of plasma-protein metabolism, not elsewhere classified: Secondary | ICD-10-CM | POA: Diagnosis present

## 2022-07-19 DIAGNOSIS — R471 Dysarthria and anarthria: Secondary | ICD-10-CM | POA: Diagnosis present

## 2022-07-19 DIAGNOSIS — Z8673 Personal history of transient ischemic attack (TIA), and cerebral infarction without residual deficits: Secondary | ICD-10-CM

## 2022-07-19 DIAGNOSIS — E669 Obesity, unspecified: Secondary | ICD-10-CM | POA: Diagnosis present

## 2022-07-19 DIAGNOSIS — R110 Nausea: Secondary | ICD-10-CM | POA: Diagnosis not present

## 2022-07-19 DIAGNOSIS — Z91148 Patient's other noncompliance with medication regimen for other reason: Secondary | ICD-10-CM

## 2022-07-19 DIAGNOSIS — G9389 Other specified disorders of brain: Secondary | ICD-10-CM | POA: Diagnosis not present

## 2022-07-19 DIAGNOSIS — R1032 Left lower quadrant pain: Secondary | ICD-10-CM | POA: Diagnosis not present

## 2022-07-19 DIAGNOSIS — B20 Human immunodeficiency virus [HIV] disease: Secondary | ICD-10-CM | POA: Diagnosis not present

## 2022-07-19 DIAGNOSIS — Z7189 Other specified counseling: Secondary | ICD-10-CM | POA: Diagnosis not present

## 2022-07-19 DIAGNOSIS — Z91199 Patient's noncompliance with other medical treatment and regimen due to unspecified reason: Secondary | ICD-10-CM

## 2022-07-19 DIAGNOSIS — Z7901 Long term (current) use of anticoagulants: Secondary | ICD-10-CM

## 2022-07-19 DIAGNOSIS — Z79899 Other long term (current) drug therapy: Secondary | ICD-10-CM

## 2022-07-19 DIAGNOSIS — R296 Repeated falls: Secondary | ICD-10-CM | POA: Diagnosis present

## 2022-07-19 DIAGNOSIS — R531 Weakness: Secondary | ICD-10-CM | POA: Diagnosis not present

## 2022-07-19 DIAGNOSIS — R0789 Other chest pain: Secondary | ICD-10-CM | POA: Diagnosis not present

## 2022-07-19 DIAGNOSIS — E785 Hyperlipidemia, unspecified: Secondary | ICD-10-CM | POA: Diagnosis present

## 2022-07-19 DIAGNOSIS — H532 Diplopia: Secondary | ICD-10-CM | POA: Diagnosis not present

## 2022-07-19 DIAGNOSIS — B009 Herpesviral infection, unspecified: Secondary | ICD-10-CM | POA: Diagnosis present

## 2022-07-19 LAB — CULTURE, BLOOD (ROUTINE X 2): Culture: NO GROWTH

## 2022-07-19 LAB — TROPONIN I (HIGH SENSITIVITY): Troponin I (High Sensitivity): 7 ng/L (ref <18)

## 2022-07-19 MED ORDER — SULFAMETHOXAZOLE-TRIMETHOPRIM 800-160 MG PO TABS
1.0000 | ORAL_TABLET | Freq: Every day | ORAL | Status: DC
  Administered 2022-07-20 – 2022-08-11 (×23): 1 via ORAL
  Filled 2022-07-19 (×24): qty 1

## 2022-07-19 MED ORDER — GUAIFENESIN-DM 100-10 MG/5ML PO SYRP: 10.0000 mL | ORAL_SOLUTION | Freq: Four times a day (QID) | ORAL | Status: DC | PRN

## 2022-07-19 MED ORDER — VITAMIN B-12 1000 MCG PO TABS
1000.0000 ug | ORAL_TABLET | Freq: Every day | ORAL | Status: DC
  Administered 2022-07-20 – 2022-08-14 (×26): 1000 ug via ORAL
  Filled 2022-07-19 (×27): qty 1

## 2022-07-19 MED ORDER — ROSUVASTATIN CALCIUM 5 MG PO TABS
10.0000 mg | ORAL_TABLET | Freq: Every day | ORAL | Status: DC
  Administered 2022-07-20 – 2022-08-14 (×26): 10 mg via ORAL
  Filled 2022-07-19 (×26): qty 2

## 2022-07-19 MED ORDER — VALACYCLOVIR HCL 500 MG PO TABS
1000.0000 mg | ORAL_TABLET | Freq: Three times a day (TID) | ORAL | Status: AC
  Administered 2022-07-19 – 2022-07-23 (×11): 1000 mg via ORAL
  Filled 2022-07-19 (×11): qty 2

## 2022-07-19 MED ORDER — ACETAMINOPHEN 325 MG PO TABS
325.0000 mg | ORAL_TABLET | ORAL | Status: DC | PRN
  Administered 2022-07-28: 650 mg via ORAL
  Filled 2022-07-19: qty 2

## 2022-07-19 MED ORDER — ALBUTEROL SULFATE (2.5 MG/3ML) 0.083% IN NEBU: 2.5000 mg | INHALATION_SOLUTION | RESPIRATORY_TRACT | Status: DC | PRN

## 2022-07-19 MED ORDER — APIXABAN 5 MG PO TABS
5.0000 mg | ORAL_TABLET | Freq: Two times a day (BID) | ORAL | Status: DC
  Administered 2022-07-24 – 2022-08-14 (×43): 5 mg via ORAL
  Filled 2022-07-19 (×43): qty 1

## 2022-07-19 MED ORDER — ONDANSETRON HCL 4 MG PO TABS
4.0000 mg | ORAL_TABLET | Freq: Four times a day (QID) | ORAL | Status: DC | PRN
  Administered 2022-07-29 – 2022-08-08 (×7): 4 mg via ORAL
  Filled 2022-07-19 (×8): qty 1

## 2022-07-19 MED ORDER — BICTEGRAVIR-EMTRICITAB-TENOFOV 50-200-25 MG PO TABS
1.0000 | ORAL_TABLET | Freq: Every day | ORAL | Status: DC
  Administered 2022-07-20 – 2022-08-14 (×26): 1 via ORAL
  Filled 2022-07-19 (×26): qty 1

## 2022-07-19 MED ORDER — SENNOSIDES-DOCUSATE SODIUM 8.6-50 MG PO TABS: 1.0000 | ORAL_TABLET | Freq: Every evening | ORAL | Status: DC | PRN

## 2022-07-19 MED ORDER — BISACODYL 10 MG RE SUPP
10.0000 mg | Freq: Every day | RECTAL | Status: DC | PRN
  Administered 2022-07-24 – 2022-08-08 (×5): 10 mg via RECTAL
  Filled 2022-07-19 (×5): qty 1

## 2022-07-19 MED ORDER — METHOCARBAMOL 500 MG PO TABS: 500.0000 mg | ORAL_TABLET | Freq: Four times a day (QID) | ORAL | Status: DC | PRN

## 2022-07-19 MED ORDER — ALUM & MAG HYDROXIDE-SIMETH 200-200-20 MG/5ML PO SUSP
30.0000 mL | ORAL | Status: DC | PRN
  Administered 2022-08-12: 30 mL via ORAL
  Filled 2022-07-19: qty 30

## 2022-07-19 MED ORDER — APIXABAN 5 MG PO TABS
10.0000 mg | ORAL_TABLET | Freq: Two times a day (BID) | ORAL | Status: AC
  Administered 2022-07-19 – 2022-07-23 (×9): 10 mg via ORAL
  Filled 2022-07-19 (×9): qty 2

## 2022-07-19 MED ORDER — ONDANSETRON HCL 4 MG/2ML IJ SOLN
4.0000 mg | Freq: Four times a day (QID) | INTRAMUSCULAR | Status: DC | PRN
  Administered 2022-08-01 – 2022-08-14 (×7): 4 mg via INTRAVENOUS
  Filled 2022-07-19 (×7): qty 2

## 2022-07-19 NOTE — Discharge Instructions (Addendum)
Inpatient Rehab Discharge Instructions  Jose Lamb Discharge date and time:  08/14/2022  Activities/Precautions/ Functional Status: Activity: no lifting, driving, or strenuous exercise until cleared by MD Diet: cardiac diet Wound Care: none needed Functional status:  ___ No restrictions     ___ Walk up steps independently _x__ 24/7 supervision/assistance   ___ Walk up steps with assistance ___ Intermittent supervision/assistance  ___ Bathe/dress independently ___ Walk with walker     ___ Bathe/dress with assistance ___ Walk Independently    ___ Shower independently ___ Walk with assistance    __x_ Shower with assistance _x__ No alcohol     ___ Return to work/school ________  Special Instructions: No driving, alcohol consumption or tobacco use.   COMMUNITY REFERRALS UPON DISCHARGE:    Home Health:   PT     OT     ST                Agency: [facility]     Phone: [phone] *Please expect follow-up within 2-3 days to schedule your home visit. If you have not received follow-up, be sure to contact the branch directly.*    Medical Equipment/Items Ordered: hospital bed, hoyer lift                                                 Agency/Supplier: [facility] [phone]   My questions have been answered and I understand these instructions. I will adhere to these goals and the provided educational materials after my discharge from the hospital.  Patient/Caregiver Signature _______________________________ Date __________  Clinician Signature _______________________________________ Date __________  Please bring this form and your medication list with you to all your follow-up doctor's appointments.   Information on my medicine - ELIQUIS (apixaban)   Why was Eliquis prescribed for you? Eliquis was prescribed for you to reduce the risk of forming blood clots.  What do You need to know about Eliquis ? Take your Eliquis TWICE DAILY - one tablet in the morning  and one tablet in the evening with or without food.  It would be best to take the doses about the same time each day.  If you have difficulty swallowing the tablet whole please discuss with your pharmacist how to take the medication safely.  Take Eliquis exactly as prescribed by your doctor and DO NOT stop taking Eliquis without talking to the doctor who prescribed the medication.  Stopping may increase your risk of developing a new clot or stroke.  Refill your prescription before you run out.  After discharge, you should have regular check-up appointments with your healthcare provider that is prescribing your Eliquis.  In the future your dose may need to be changed if your kidney function or weight changes by a significant amount or as you get older.  What do you do if you miss a dose? If you miss a dose, take it as soon as you remember on the same day and resume taking twice daily.  Do not take more than one dose of ELIQUIS at the same time.  Important Safety Information A possible side effect of Eliquis is bleeding. You should call your healthcare provider right away if you experience any of the following: Bleeding from an injury or your nose that does not stop. Unusual colored urine (red or dark brown) or unusual colored stools (red or black). Unusual  bruising for unknown reasons. A serious fall or if you hit your head (even if there is no bleeding).  Some medicines may interact with Eliquis and might increase your risk of bleeding or clotting while on Eliquis. To help avoid this, consult your healthcare provider or pharmacist prior to using any new prescription or non-prescription medications, including herbals, vitamins, non-steroidal anti-inflammatory drugs (NSAIDs) and supplements.  This website has more information on Eliquis (apixaban): http://www.eliquis.com/eliquis/home

## 2022-07-19 NOTE — Progress Notes (Signed)
Inpatient Rehabilitation Admission Medication Review by a Pharmacist  A complete drug regimen review was completed for this patient to identify any potential clinically significant medication issues.  High Risk Drug Classes Is patient taking? Indication by Medication  Antipsychotic No   Anticoagulant Yes Apixaban - DVT, PE  Antibiotic Yes Bactrim - PCP ppx  Opioid No   Antiplatelet No   Hypoglycemics/insulin No   Vasoactive Medication No   Chemotherapy No   Other Yes Albuterol prn SOB Ondansetron prn N/V Methocarbamol prn spasms Biktarvy - HIV Valtrex until 6/15 HSV Rosuvastatin - HLD     Type of Medication Issue Identified Description of Issue Recommendation(s)  Drug Interaction(s) (clinically significant)     Duplicate Therapy     Allergy     No Medication Administration End Date     Incorrect Dose     Additional Drug Therapy Needed     Significant med changes from prior encounter (inform family/care partners about these prior to discharge).    Other       Clinically significant medication issues were identified that warrant physician communication and completion of prescribed/recommended actions by midnight of the next day:  No  Pharmacist comments: None  Time spent performing this drug regimen review (minutes):  20 minutes  Okey Regal, PharmD

## 2022-07-19 NOTE — Progress Notes (Signed)
Physical Therapy Treatment Patient Details Name: Jose Lamb MRN: [id number] DOB: 1960-06-10 Today's Date: 07/19/2022   History of Present Illness Jose Lamb is a 62 y.o. male who presents to [facility] hospital on 07/08/2022 as a transfer from [facility] with 6 weeks of left-sided weakness and frequent falls. MRI brain revealed concern for inflammatory or infectious process. Pt discharged to [facility] on 6/7, found to have bilateral PE same date and was readmitted. PMHx: CVA, paroxysmal atrial fibrillation not on oral anticoagulation, asthma, HIV with noncompliance, history of pulmonary embolism.    PT Comments    Pt greeted resting in bed and agreeable to session with encouragement. Pt requiring min A to come to sitting EOB and cues throughout sitting up EOB for posture and optimal UE placement for improved balance. Pt able to come to stand with mod A and step pivot to recliner with mod A to maintain balance as pt with continued tendency for L lateral lean and requiring cues and manual facilitation to step LLE. Pt agreeable to time up in chair at end of session and verbalizing importance of time up OOB. Pt continues to benefit from skilled PT services to progress toward functional mobility goals.     Recommendations for follow up therapy are one component of a multi-disciplinary discharge planning process, led by the attending physician.  Recommendations may be updated based on patient status, additional functional criteria and insurance authorization.  Follow Up Recommendations       Assistance Recommended at Discharge Frequent or constant Supervision/Assistance  Patient can return home with the following Two people to help with walking and/or transfers;A lot of help with bathing/dressing/bathroom;Assistance with cooking/housework;Assist for transportation;Help with stairs or ramp for entrance;Direct supervision/assist for medications management;Direct supervision/assist for financial management    Equipment Recommendations  Rolling walker (2 wheels);BSC/3in1;Wheelchair (measurements PT);Wheelchair cushion (measurements PT);Hospital bed    Recommendations for Other Services       Precautions / Restrictions Precautions Precautions: Fall Precaution Comments: Lt hemi, poor sitting/standing balance Restrictions Weight Bearing Restrictions: No     Mobility  Bed Mobility Overal bed mobility: Needs Assistance Bed Mobility: Supine to Sit     Supine to sit: Min assist, HOB elevated     General bed mobility comments: min A to elevate trunk and manage LLE    Transfers Overall transfer level: Needs assistance Equipment used: Rolling walker (2 wheels) Transfers: Sit to/from Stand, Bed to chair/wheelchair/BSC Sit to Stand: Mod assist   Step pivot transfers: Mod assist       General transfer comment: lifting and lowering assistance provided with standing from EOB. cues for sequencing and techniques to facilitate stepping LLE to chair    Ambulation/Gait                   Stairs             Wheelchair Mobility    Modified Rankin (Stroke Patients Only)       Balance Overall balance assessment: Needs assistance Sitting-balance support: Feet supported, Bilateral upper extremity supported Sitting balance-Leahy Scale: Poor   Postural control: Left lateral lean Standing balance support: Bilateral upper extremity supported, During functional activity, Reliant on assistive device for balance Standing balance-Leahy Scale: Poor Standing balance comment: external support required to maintain standing balance                            Cognition Arousal/Alertness: Awake/alert Behavior During Therapy: WFL for tasks assessed/performed Overall Cognitive  Status: Impaired/Different from baseline Area of Impairment: Attention, Memory, Following commands, Safety/judgement, Awareness, Problem solving                   Current Attention Level:  Selective Memory: Decreased recall of precautions, Decreased short-term memory Following Commands: Follows one step commands with increased time Safety/Judgement: Decreased awareness of safety, Decreased awareness of deficits Awareness: Emergent Problem Solving: Slow processing, Difficulty sequencing, Requires verbal cues, Requires tactile cues, Decreased initiation General Comments: difficulty sequencing with increased time required        Exercises Other Exercises Other Exercises: reviewed flutter valve use as pt demonstrating use incorectly, with education and cues pt able to demosntrate proper use x5    General Comments General comments (skin integrity, edema, etc.): dyspnea with exertion with vitals stable throughout session      Pertinent Vitals/Pain Pain Assessment Pain Assessment: Faces Faces Pain Scale: Hurts little more Pain Location: gneralized with all mobility Pain Descriptors / Indicators: Sore Pain Intervention(s): Monitored during session, Limited activity within patient's tolerance, Repositioned    Home Living                          Prior Function            PT Goals (current goals can now be found in the care plan section) Acute Rehab PT Goals Patient Stated Goal: to get stronger PT Goal Formulation: With patient Time For Goal Achievement: 07/31/22 Progress towards PT goals: Progressing toward goals    Frequency    Min 4X/week      PT Plan Current plan remains appropriate    Co-evaluation              AM-PAC PT "6 Clicks" Mobility   Outcome Measure  Help needed turning from your back to your side while in a flat bed without using bedrails?: A Little Help needed moving from lying on your back to sitting on the side of a flat bed without using bedrails?: A Lot Help needed moving to and from a bed to a chair (including a wheelchair)?: A Lot Help needed standing up from a chair using your arms (e.g., wheelchair or bedside  chair)?: A Lot Help needed to walk in hospital room?: Total Help needed climbing 3-5 steps with a railing? : Total 6 Click Score: 11    End of Session Equipment Utilized During Treatment: Gait belt Activity Tolerance: Patient tolerated treatment well Patient left: in chair;with call bell/phone within reach;with chair alarm set Nurse Communication: Mobility status PT Visit Diagnosis: Other abnormalities of gait and mobility (R26.89);Muscle weakness (generalized) (M62.81);Hemiplegia and hemiparesis Hemiplegia - Right/Left: Left Hemiplegia - dominant/non-dominant: Non-dominant Hemiplegia - caused by: Unspecified     Time: 1610-9604 PT Time Calculation (min) (ACUTE ONLY): 15 min  Charges:  $Therapeutic Activity: 8-22 mins                     Cortnie Ringel R. PTA Acute Rehabilitation Services Office: [phone]   Jose Lamb 07/19/2022, 12:39 PM

## 2022-07-19 NOTE — Progress Notes (Signed)
Inpatient Rehab Admissions Coordinator:   I have a bed for Mr. Dykstra to admit to CIR today.  Dr. Randol Kern in agreement.  Will let TOC and pt/family know.   Estill Dooms, PT, DPT Admissions Coordinator [phone] 07/19/22  10:53 AM

## 2022-07-19 NOTE — Progress Notes (Signed)
   07/18/22 2126 07/18/22 2128  Provider Notification  Provider Name/Title V. Rathore MD  --   Date Provider Notified 07/18/22  --   Time Provider Notified 2126  --   Method of Notification Page  --   Notification Reason Change in status (new chest p)  --   Provider response  (rapid response also called)  --   Notify: Rapid Response  Name of Rapid Response RN Notified  --  Theodoro Grist, RN  Date Rapid Response Notified  --  07/18/22  Time Rapid Response Notified  --  1925

## 2022-07-19 NOTE — H&P (Signed)
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  Chief complaints were allover soreness and weakness.  Workup was significant for possible urinary tract infection and hypokalemia.  He was placed on antibiotics and given potassium supplement and discharged home.  He re-presented to the emergency department at [facility] on 07/07/2022 complaining of an approximately 6-week history of left-sided weakness.  Notes indicate the patient was sent there for MRI scanning.  Neurology was consulted and MRI of the brain revealed abnormal T2 signal of the cortical subcortical and right greater than left insular postcentral gyrus as well as splenium and body of corpus callosum and thalamus and midbrain.  This was concerning for infectious versus inflammatory process such as PML, autoimmune encephalitis, herpes simplex and enteritis.  Recommendations were for high-volume LP with labs, MRI of the brain with contrast and admission to [facility].  The patient's pertinent past medical history includes HIV, hypertension and diabetes mellitus.  The patient's history of HIV disease is longstanding and he has not been on ART for several years going back to 2017.  His most recent regimen was Descovy, darnunavir/ritonavir and etravirine.  Prior history of bilateral pulmonary embolism 2021, atrial fibrillation in the setting of hyperthyroidism.  The patient is not chronically anticoagulated.   Infectious disease consultation obtained by Dr. Earlene Plater on 6/01.  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  Given high-dose B12  daily. Imaging was also significant for bilateral parotid masses and ENT consultation placed for biopsy.  Ultrasound-guided aspiration of bilateral parotid cyst on 6/03 by Dr. Grace Isaac.  Negative for malignancy. He was admitted to [facility] on 6/07 and developed fever and tachypnea with tachycardia at approximately 8 pm. He was transferred back to acute bed and found to have extensive bilateral pulmonary emboli with moderate-to-large thrombus volume and evidence of right heart strain. Placed on Eliquis. Tolerating heart healthy diet. 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 Pt with subsequent left greater than right weakness and sensory loss particularly involving the lower extremities. Has cough.  Review of Systems  Constitutional:  Negative for chills and fever.  HENT:  Negative for congestion and sore throat.   Eyes:  Negative for blurred vision and double vision.  Respiratory:  Positive for cough. Negative for sputum production and shortness of breath.   Cardiovascular:  Positive for chest pain. Negative for palpitations.  Gastrointestinal:  Negative for nausea and vomiting.  Genitourinary:  Negative for dysuria and urgency.  Musculoskeletal:  Positive for back pain. Negative for neck pain.  Neurological:  Negative for dizziness and headaches.  Psychiatric/Behavioral:  Negative for depression. The patient does not have insomnia.    Past Medical History:  Diagnosis Date   Atrial fibrillation [facility])    In the setting of hyperthyroidism   Bilateral pulmonary embolism [facility])    Diagnosed August 2021   Diabetes mellitus without complication (HCC)    Essential hypertension    HIV antibody positive (HCC)  Perforated duodenal ulcer (HCC) 2015   Contained without need for surgical intervention   Pneumonia due to  COVID-19 virus    Diagnosed August 2021   Past Surgical History:  Procedure Laterality Date   COLONOSCOPY  Sept 2015   [place]: transverse colon polyp with pathology reporting lymphoid nodule    ESOPHAGOGASTRODUODENOSCOPY N/A 03/30/2014   Procedure: ESOPHAGOGASTRODUODENOSCOPY (EGD);  Surgeon: Corbin Ade, MD;  Location: [facility] ENDO SUITE;  Service: Endoscopy;  Laterality: N/A;  215   UMBILICAL HERNIA REPAIR N/A 12/16/2021   Procedure: HERNIA REPAIR UMBILICAL ADULT WITH MESH;  Surgeon: Franky Macho, MD;  Location: [facility] ORS;  Service: General;  Laterality: N/A;   Family History  Problem Relation Age of Onset   Ulcers Mother    Heart Problems Mother    Colon cancer Neg Hx    Social History:  reports that he has never smoked. He has never used smokeless tobacco. He reports that he does not drink alcohol and does not use drugs. Allergies:  Allergies  Allergen Reactions   Asa [Aspirin] Other (See Comments)    Epistaxis   Medications Prior to Admission  Medication Sig Dispense Refill   acetaminophen (TYLENOL) 325 MG tablet Take 2 tablets (650 mg total) by mouth every 6 (six) hours as needed for mild pain or moderate pain (or Fever >/= 101).     albuterol (PROVENTIL) (2.5 MG/3ML) 0.083% nebulizer solution Take 2.5 mg by nebulization every 4 (four) hours.     albuterol (VENTOLIN HFA) 108 (90 Base) MCG/ACT inhaler 1 puff every 4 (four) hours as needed for wheezing or shortness of breath.     apixaban (ELIQUIS) 5 MG TABS tablet Take 2 tablets (10 mg total) by mouth 2 (two) times daily for 5 days. 60 tablet    [START ON 07/24/2022] apixaban (ELIQUIS) 5 MG TABS tablet Take 1 tablet (5 mg total) by mouth 2 (two) times daily. 60 tablet    ascorbic acid (VITAMIN C) 500 MG tablet Take 1 tablet (500 mg total) by mouth daily. 30 tablet 1   bictegravir-emtricitabine-tenofovir AF (BIKTARVY) 50-200-25 MG TABS tablet Take 1 tablet by mouth daily. 30 tablet 2   cyanocobalamin 1000 MCG tablet Take 1 tablet  (1,000 mcg total) by mouth daily. 30 tablet 0   oxyCODONE-acetaminophen (PERCOCET) 10-325 MG tablet Take 1 tablet by mouth every 6 (six) hours.     rosuvastatin (CRESTOR) 10 MG tablet Take 1 tablet (10 mg total) by mouth daily. 30 tablet 0   sulfamethoxazole-trimethoprim (BACTRIM DS) 800-160 MG tablet Take 1 tablet by mouth daily. 30 tablet 2   valACYclovir (VALTREX) 1000 MG tablet Take 1 tablet (1,000 mg total) by mouth 3 (three) times daily for 9 days. 27 tablet 0      Home: Home Living Family/patient expects to be discharged to:: Private residence Living Arrangements: Spouse/significant other, Children, Other relatives   Functional History:    Functional Status:  Mobility:          ADL:    Cognition: Cognition Orientation Level: Oriented X4    Physical Exam: Blood pressure 126/85, pulse 91, temperature 99 F (37.2 C), temperature source Oral, resp. rate 20, height 5\' 11"  (1.803 m), weight 92.1 kg, SpO2 96 %. Physical Exam Constitutional:      General: He is not in acute distress. HENT:     Head: Normocephalic and atraumatic.  Eyes:     Extraocular Movements: Extraocular movements intact.     Pupils: Pupils are equal, round, and reactive  to light.  Cardiovascular:     Rate and Rhythm: Normal rate and regular rhythm.  Pulmonary:     Effort: Pulmonary effort is normal.     Breath sounds: Normal breath sounds.  Abdominal:     General: Bowel sounds are normal.     Palpations: Abdomen is soft.     Tenderness: There is no abdominal tenderness.  Musculoskeletal:        General: No swelling or deformity.  Skin:    General: Skin is warm and dry.  Neurological:     General: No focal deficit present.     Mental Status: He is alert and oriented to person, place, and time.     Motor: Weakness present. RLE 4/5, LLE 3/5. Decreased sensation in both legs.  Psychiatric:        Mood and Affect: Mood normal.        Behavior: Behavior normal.   Results for orders placed  or performed during the hospital encounter of 07/15/22 (from the past 48 hour(s))  Magnesium     Status: None   Collection Time: 07/18/22  3:45 AM  Result Value Ref Range   Magnesium 2.0 1.7 - 2.4 mg/dL    Comment: Performed at [facility], [address], [place], [place] [address]  CBC with Differential/Platelet     Status: Abnormal   Collection Time: 07/18/22  3:45 AM  Result Value Ref Range   WBC 3.9 (L) 4.0 - 10.5 K/uL   RBC 4.19 (L) 4.22 - 5.81 MIL/uL   Hemoglobin 12.3 (L) 13.0 - 17.0 g/dL   HCT 60.4 (L) 54.0 - 98.1 %   MCV 88.8 80.0 - 100.0 fL   MCH 29.4 26.0 - 34.0 pg   MCHC 33.1 30.0 - 36.0 g/dL   RDW 19.1 47.8 - 29.5 %   Platelets 206 150 - 400 K/uL   nRBC 0.0 0.0 - 0.2 %   Neutrophils Relative % 47 %   Neutro Abs 1.9 1.7 - 7.7 K/uL   Lymphocytes Relative 33 %   Lymphs Abs 1.3 0.7 - 4.0 K/uL   Monocytes Relative 13 %   Monocytes Absolute 0.5 0.1 - 1.0 K/uL   Eosinophils Relative 5 %   Eosinophils Absolute 0.2 0.0 - 0.5 K/uL   Basophils Relative 1 %   Basophils Absolute 0.0 0.0 - 0.1 K/uL   Immature Granulocytes 1 %   Abs Immature Granulocytes 0.02 0.00 - 0.07 K/uL    Comment: Performed at [facility], [address], [place], [place] [address]  Brain natriuretic peptide     Status: None   Collection Time: 07/18/22  3:45 AM  Result Value Ref Range   B Natriuretic Peptide 29.5 0.0 - 100.0 pg/mL    Comment: Performed at [facility], [address], [place], [place] [address]  Comprehensive metabolic panel     Status: Abnormal   Collection Time: 07/18/22  3:45 AM  Result Value Ref Range   Sodium 138 135 - 145 mmol/L   Potassium 3.6 3.5 - 5.1 mmol/L   Chloride 104 98 - 111 mmol/L   CO2 24 22 - 32 mmol/L   Glucose, Bld 104 (H) 70 - 99 mg/dL    Comment: Glucose reference range applies only to samples taken after fasting for at least 8 hours.   BUN 13 6 - 20 mg/dL   Creatinine, Ser 4.69 0.61 - 1.24 mg/dL   Calcium 9.3 8.9 - 62.9 mg/dL  Total  Protein 7.4 6.5 - 8.1 g/dL   Albumin 2.9 (L) 3.5 - 5.0 g/dL   AST 14 (L) 15 - 41 U/L   ALT 14 0 - 44 U/L   Alkaline Phosphatase 56 38 - 126 U/L   Total Bilirubin 0.5 0.3 - 1.2 mg/dL   GFR, Estimated >78 >46 mL/min    Comment: (NOTE) Calculated using the CKD-EPI Creatinine Equation (2021)    Anion gap 10 5 - 15    Comment: Performed at [facility], [address], [place], [place] [address]  Troponin I (High Sensitivity)     Status: None   Collection Time: 07/18/22  9:52 PM  Result Value Ref Range   Troponin I (High Sensitivity) 8 <18 ng/L    Comment: (NOTE) Elevated high sensitivity troponin I (hsTnI) values and significant  changes across serial measurements may suggest ACS but many other  chronic and acute conditions are known to elevate hsTnI results.  Refer to the "Links" section for chest pain algorithms and additional  guidance. Performed at [facility], [address], [place], [place] [address]   Troponin I (High Sensitivity)     Status: None   Collection Time: 07/18/22 11:02 PM  Result Value Ref Range   Troponin I (High Sensitivity) 7 <18 ng/L    Comment: (NOTE) Elevated high sensitivity troponin I (hsTnI) values and significant  changes across serial measurements may suggest ACS but many other  chronic and acute conditions are known to elevate hsTnI results.  Refer to the "Links" section for chest pain algorithms and additional  guidance. Performed at [facility], [address] [place], [place] [address]    DG CHEST PORT 1 VIEW  Result Date: 07/18/2022 CLINICAL DATA:  Chest pain EXAM: PORTABLE CHEST 1 VIEW COMPARISON:  07/14/2022 FINDINGS: Shallow inspiration. Linear atelectasis in the mid and lower lungs. No pleural effusions. No pneumothorax. Mediastinal contours appear intact. IMPRESSION: Shallow inspiration with linear atelectasis in the lungs. Electronically Signed   By: Burman Nieves M.D.   On: 07/18/2022 22:23      Blood pressure  126/85, pulse 91, temperature 99 F (37.2 C), temperature source Oral, resp. rate 20, height 5\' 11"  (1.803 m), weight 92.1 kg, SpO2 96 %.  Medical Problem List and Plan: 1. 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 Pt with subsequent left greater than right weakness and sensory loss particularly involving the lower extremities.   -patient may shower  -ELOS/Goals: 12-16 days   Admit to CIR 2.  Antithrombotics: -DVT/anticoagulation:  Pharmaceutical: Eliquis  -antiplatelet therapy: none  3. Pain Management: Tylenol as needed  4. Mood/Behavior/Sleep: LCSW to evaluate and provide emotional support  -antipsychotic agents: n/a  5. Neuropsych/cognition: This patient is capable of making decisions on his own behalf.  6. Skin/Wound Care: Routine skin care checks   7. 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 duration   14: History of a. fib: continue Eliquis for  PE   15: DM-2; A1c = 6.6% on 04/10/2022; (no home meds listed)             -serum glucose range: 89-119  -continue carb modified diet  16: Acute PE and right lower extremity DVT  -continue Eliquis  17: Obesity: BMI 32   18: Chest pain 6/11>>negative trop x 2; EKG non-acute  -attributed to PE versus musculoskeletal    I have personally performed a face to face diagnostic evaluation, including, but not limited to relevant history and physical exam findings, of this patient and developed relevant assessment and plan.  Additionally, I have reviewed and concur with the physician  assistant's documentation above.  Sula Soda, MD  Wendi Maya, PA  Horton Chin, MD 07/19/2022

## 2022-07-19 NOTE — PMR Pre-admission (Signed)
PMR Admission Coordinator Pre-Admission Assessment  Patient: Jose Lamb is an 62 y.o., male MRN: [id number] DOB: [birth year] Height:   Weight: 96.4 kg  Insurance Information HMO:     PPO: yes     PCP:      IPA:      80/20:      OTHER:  PRIMARY: BCBS      Policy#: [id number]      Subscriber: pt CM Name: Sharyn Lull      Phone#: [phone]     Fax#: [phone] Pre-Cert#: [id number] auth for CIR via fax from Sharyn Lull for 6/11 with updates due to fax listed above on 6/16.        Employer:  Benefits:  Phone #: [phone]     Name:  Eff. Date: 05/08/22     Deduct: $1000 ($0 met)      Out of Pocket Max: $2500 (met $106.51)      Life Max: n/a CIR: 85%      SNF: 85% Outpatient: 85%     Co-Pay: 15% Home Health: 85%      Co-Pay: 15% DME: 85%     Co-Pay: 15% Providers:  SECONDARY:       Policy#:      Phone#:   Artist:       Phone#:   The Engineer, materials Information Summary" for patients in Inpatient Rehabilitation Facilities with attached "Privacy Act Statement-Health Care Records" was provided and verbally reviewed with: N/A  Emergency Contact Information Contact Information     Name Relation Home Work Mobile   Marion Spouse [phone]  [phone]   Burnadette Peter   [phone]       Current Medical History  Patient Admitting Diagnosis: 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 male with a history of a prior CVA as well as paroxysmal atrial fibrillation (not on a/c), asthma, HIV with noncompliance, pulmonary embolism who presented on 07/07/2022 with a 6-week history of increasing left-sided weakness of his arm and leg.  He was having frequent falls as well.  Patient reportedly was not taking his meds for HIV.  MRI of the brain was done at the time which demonstrated T2 signal hyperintensity in the right greater than left insula and postcentral gyrus.   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  Patient currently is on Biktarvy and Bactrim per infectious disease. Therapy evaluations completed and pt was recommended for CIR and he was admitted on 6/7 where he quickly developed a fever, tachypnea, and tachycardia at 8pm.  He was transferred back to acute and found to have bilateral pulmonary embolisms with evidence of heart strain.  He was stabilized, transitioned to eliquis, and cleared to return to CIR.    Complete NIHSS TOTAL: 3  Patient's medical record from [facility] has been reviewed by the rehabilitation admission coordinator and physician.  Past Medical History  Past Medical History:  Diagnosis Date   Atrial fibrillation (HCC)    In the setting of hyperthyroidism   Bilateral pulmonary embolism [facility])    Diagnosed August 2021   Diabetes mellitus without complication (HCC)    Essential hypertension    HIV antibody positive (HCC)    Perforated duodenal ulcer (HCC) 2015   Contained without need for surgical intervention   Pneumonia due to COVID-19 virus  Diagnosed August 2021    Has the patient had major surgery during 100 days prior to admission? No  Family History   family history includes Heart Problems in his mother; Ulcers in his mother.  Current Medications  Current Facility-Administered Medications:    acetaminophen (TYLENOL) tablet 650 mg, 650 mg, Oral, Q6H PRN, 650 mg at 07/19/22 0844 **OR** acetaminophen (TYLENOL) suppository 650 mg, 650 mg, Rectal, Q6H PRN, Allena Katz, Vishal R, MD   albuterol (PROVENTIL) (2.5 MG/3ML) 0.083% nebulizer solution 2.5 mg, 2.5 mg, Nebulization, Q4H PRN, Charlsie Quest, MD   apixaban (ELIQUIS) tablet 10 mg, 10 mg, Oral, BID, 10 mg at 07/19/22 0840 **FOLLOWED BY** [START ON 07/24/2022] apixaban (ELIQUIS) tablet 5 mg, 5 mg, Oral, BID, Pham, Minh  Q, RPH-CPP   bictegravir-emtricitabine-tenofovir AF (BIKTARVY) 50-200-25 MG per tablet 1 tablet, 1 tablet, Oral, Daily, Darreld Mclean R, MD, 1 tablet at 07/19/22 0841   bisacodyl (DULCOLAX) suppository 10 mg, 10 mg, Rectal, Daily PRN, Darreld Mclean R, MD   cyanocobalamin (VITAMIN B12) tablet 1,000 mcg, 1,000 mcg, Oral, Daily, Allena Katz, Vishal R, MD, 1,000 mcg at 07/19/22 0840   ondansetron (ZOFRAN) tablet 4 mg, 4 mg, Oral, Q6H PRN **OR** ondansetron (ZOFRAN) injection 4 mg, 4 mg, Intravenous, Q6H PRN, Allena Katz, Vishal R, MD   rosuvastatin (CRESTOR) tablet 10 mg, 10 mg, Oral, Daily, Allena Katz, Vishal R, MD, 10 mg at 07/19/22 0841   senna-docusate (Senokot-S) tablet 1 tablet, 1 tablet, Oral, QHS PRN, Allena Katz, Vishal R, MD   sodium chloride flush (NS) 0.9 % injection 3 mL, 3 mL, Intravenous, Q12H, Darreld Mclean R, MD, 3 mL at 07/19/22 0842   sulfamethoxazole-trimethoprim (BACTRIM DS) 800-160 MG per tablet 1 tablet, 1 tablet, Oral, Daily, Darreld Mclean R, MD, 1 tablet at 07/19/22 0841   valACYclovir (VALTREX) tablet 1,000 mg, 1,000 mg, Oral, TID, Leroy Sea, MD, 1,000 mg at 07/19/22 0841  Patients Current Diet:  Diet Order             Diet - low sodium heart healthy           Diet Heart Room service appropriate? Yes with Assist; Fluid consistency: Thin  Diet effective now                   Precautions / Restrictions Precautions Precautions: Fall Precaution Comments: Lt hemi, poor sitting/standing balance Restrictions Weight Bearing Restrictions: No   Has the patient had 2 or more falls or a fall with injury in the past year? Yes  Prior Activity Level Community (5-7x/wk): prior to 6 weeks ago, fully independent, working full time in heavy machinery, driving, no DME  Prior Functional Level Self Care: Did the patient need help bathing, dressing, using the toilet or eating? Independent  Indoor Mobility: Did the patient need assistance with walking from room to room (with or without  device)? Independent  Stairs: Did the patient need assistance with internal or external stairs (with or without device)? Independent  Functional Cognition: Did the patient need help planning regular tasks such as shopping or remembering to take medications? Independent  Patient Information Are you of Hispanic, Latino/a,or Spanish origin?: A. No, not of Hispanic, Latino/a, or Spanish origin What is your race?: B. Black or African American Do you need or want an interpreter to communicate with a doctor or health care staff?: 0. No  Patient's Response To:  Health Literacy and Transportation Is the patient able to respond to health literacy and transportation needs?: Yes Health Literacy - How often  do you need to have someone help you when you read instructions, pamphlets, or other written material from your doctor or pharmacy?: Never In the past 12 months, has lack of transportation kept you from medical appointments or from getting medications?: No In the past 12 months, has lack of transportation kept you from meetings, work, or from getting things needed for daily living?: No  Journalist, newspaper / Equipment Home Equipment: Wheelchair - power, Medical laboratory scientific officer - single point  Prior Device Use: Indicate devices/aids used by the patient prior to current illness, exacerbation or injury? None of the above  Current Functional Level Cognition  Overall Cognitive Status: Impaired/Different from baseline Current Attention Level: Selective Orientation Level: Oriented X4 Following Commands: Follows one step commands with increased time Safety/Judgement: Decreased awareness of safety, Decreased awareness of deficits General Comments: difficulty sequencing with increased time required    Extremity Assessment (includes Sensation/Coordination)  Upper Extremity Assessment: Generalized weakness, RUE deficits/detail, [name] deficits/detail RUE Deficits / Details: A/ROM WFL. 4-/5 overall in shoulder, elbow (all  ranges). Impaired gross grasp. RUE Sensation: WNL RUE Coordination: WNL [name] Deficits / Details: 3/5 shoulder, elbow (all ranges). Impaired gross grasp with inattention noted. [name] Sensation: decreased light touch [name] Coordination: decreased fine motor, decreased gross motor  Lower Extremity Assessment: LLE deficits/detail LLE Deficits / Details: Increased difficulty with picking up LLE while standing and attempting step pivot transfer towards recliner on the right side. Required physical assist to move left leg. LLE Sensation: decreased light touch    ADLs  Overall ADL's : Needs assistance/impaired Eating/Feeding: Set up, Sitting Grooming: Wash/dry face, Sitting, Supervision/safety Upper Body Bathing: Set up, Sitting Lower Body Bathing: Total assistance, Sit to/from stand Upper Body Dressing : Min guard, Sitting Lower Body Dressing: Total assistance, Sit to/from stand Toilet Transfer: Maximal assistance, Stand-pivot, BSC/3in1, Rolling walker (2 wheels), Cueing for sequencing, Cueing for safety Toilet Transfer Details (indicate cue type and reason): towards right side Toileting- Clothing Manipulation and Hygiene: Total assistance, Sit to/from stand, Cueing for safety, Cueing for sequencing Functional mobility during ADLs: Rolling walker (2 wheels), Cueing for sequencing, Cueing for safety, Moderate assistance    Mobility  Overal bed mobility: Needs Assistance Bed Mobility: Supine to Sit Supine to sit: Min assist, HOB elevated Sit to supine: Min assist, HOB elevated General bed mobility comments: not assessed as patient sitting up on arrival and post session    Transfers  Overall transfer level: Needs assistance Equipment used: Rolling walker (2 wheels) Transfers: Sit to/from Stand Sit to Stand: Mod assist Bed to/from chair/wheelchair/BSC transfer type:: Step pivot Step pivot transfers: Mod assist General transfer comment: lifting and lowering assistance provided with standing from  recliner chair. cues for sequencing and techniques to facilitate independence    Ambulation / Gait / Stairs / Wheelchair Mobility  Ambulation/Gait Pre-gait activities: faciliation for weight shifting to left with decreased weight acceptance and fatigue with standing activity. emphasis on midline and upright posture. patient is able take one step to the left with the left leg while weight shifting to right    Posture / Balance Dynamic Sitting Balance Sitting balance - Comments: Pt initially required BUE to maintain balance. With VC for weight shifting, pt was able to maintain balance for a short period of time while both hands were resting on lap.Unable to keep left leg in safe position while seated while hip preferred to externally rotate throwing off sitting balance. Required physical assist to correct. Balance Overall balance assessment: Needs assistance Sitting-balance support: Feet supported, Bilateral upper  extremity supported Sitting balance-Leahy Scale: Poor Sitting balance - Comments: Pt initially required BUE to maintain balance. With VC for weight shifting, pt was able to maintain balance for a short period of time while both hands were resting on lap.Unable to keep left leg in safe position while seated while hip preferred to externally rotate throwing off sitting balance. Required physical assist to correct. Postural control: Left lateral lean Standing balance support: Bilateral upper extremity supported, During functional activity, Reliant on assistive device for balance Standing balance-Leahy Scale: Poor Standing balance comment: external support required to maintain standing balance    Special needs/care consideration Special service needs ID/HIV   Previous Home Environment (from acute therapy documentation) Living Arrangements: Spouse/significant other, Children, Other relatives Available Help at Discharge: Family Type of Home: House Home Layout: Multi-level, Laundry or work  area in basement, Full bath on main level, Able to live on main level with bedroom/bathroom Home Access: Stairs to enter Entrance Stairs-Rails: None Entrance Stairs-Number of Steps: 2 Bathroom Shower/Tub: Associate Professor: Yes  Discharge Living Setting Plans for Discharge Living Setting: Patient's home, Lives with (comment) (spouse) Type of Home at Discharge: House Discharge Home Layout: Able to live on main level with bedroom/bathroom Discharge Home Access: Stairs to enter (working on a ramp) Entrance Stairs-Rails: None Secretary/administrator of Steps: 2 Discharge Bathroom Shower/Tub: Community education officer: Standard Discharge Bathroom Accessibility: Yes How Accessible: Accessible via walker Does the patient have any problems obtaining your medications?: No  Social/Family/Support Systems Patient Roles: Spouse Anticipated Caregiver: [name] Dentist) Larrison (spouse) Anticipated Industrial/product designer Information: [phone] Ability/Limitations of Caregiver: none stated, likely min assist mobility/ADLs Caregiver Availability: 24/7 Discharge Plan Discussed with Primary Caregiver: Yes Is Caregiver In Agreement with Plan?: Yes Does Caregiver/Family have Issues with Lodging/Transportation while Pt is in Rehab?: No  Goals Patient/Family Goal for Rehab: PT/OT supervision to min assist, SLP n/a Expected length of stay: 12-14 days Additional Information: Discharge plan: return home with pt's spouse 24/7 support, resume Biktarvy Pt/Family Agrees to Admission and willing to participate: Yes Program Orientation Provided & Reviewed with Pt/Caregiver Including Roles  & Responsibilities: Yes  Barriers to Discharge: Medical stability  Decrease burden of Care through IP rehab admission: n/a  Possible need for SNF placement upon discharge: Not anticipated.  Discharge plan: return to pt's home with spouse providing 24/7  supervision/min assist.  Per original consult dated 6/6 pt may potentially reach mod I level.    Patient Condition: I have reviewed medical records from [facility], spoken with CSW, and patient and spouse. I met with patient at the bedside and discussed via phone for inpatient rehabilitation assessment.  Patient will benefit from ongoing PT, OT, and SLP, can actively participate in 3 hours of therapy a day 5 days of the week, and can make measurable gains during the admission.  Patient will also benefit from the coordinated team approach during an Inpatient Acute Rehabilitation admission.  The patient will receive intensive therapy as well as Rehabilitation physician, nursing, social worker, and care management interventions.  Due to safety, skin/wound care, disease management, medication administration, pain management, and patient education the patient requires 24 hour a day rehabilitation nursing.  The patient is currently mod to max assist with mobility and basic ADLs.  Discharge setting and therapy post discharge at home with home health is anticipated.  Patient has agreed to participate in the Acute Inpatient Rehabilitation Program and will admit today.  Preadmission Screen Completed By:  Ladora Daniel  Broadus John, PT, DPT 07/19/2022 11:37 AM ______________________________________________________________________   Discussed status with Dr. Carlis Abbott on 07/19/22  at 11:37 AM  and received approval for admission today.  Admission Coordinator:  Stephania Fragmin, PT, DPT time 11:37 AM Dorna Bloom 07/19/22    Assessment/Plan: Diagnosis: PML Does the need for close, 24 hr/day Medical supervision in concert with the patient's rehab needs make it unreasonable for this patient to be served in a less intensive setting? 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 Yes Does the patient require coordinated care of a physician, rehab nurse, PT, OT, and SLP to address physical and functional deficits in the context of the above medical diagnosis(es)? Yes Addressing deficits in the following areas: balance, endurance, locomotion, strength, transferring, bowel/bladder control, bathing, dressing, feeding, grooming, toileting, cognition, and psychosocial support Can the patient actively participate in an intensive therapy program of at least 3 hrs of therapy 5 days a week? Yes The potential for patient to make measurable gains while on inpatient rehab is excellent Anticipated functional outcomes upon discharge from inpatient rehab: supervision PT, supervision OT, supervision SLP Estimated rehab length of stay to reach the above functional goals is: 12-16 days Anticipated discharge destination: Home 10. Overall Rehab/Functional Prognosis: excellent   MD Signature: Sula Soda, MD

## 2022-07-19 NOTE — Discharge Summary (Signed)
Jose Lamb:[id number] DOB: 1960/08/18 DOA: 07/15/2022  PCP: Jose Epley, [name]  Admit date: 07/15/2022  Discharge date: 07/19/2022  Admitted From: CIR   Disposition:  CIR  No significant updates on discharge summary dictated by Dr. Thedore Mins 07/18/2022  Recommendations for Outpatient Follow-up:   Follow up with PCP in 1-2 weeks  PCP Please obtain BMP/CBC, 2 view CXR in 1week,  (see Discharge instructions)   PCP Please follow up on the following pending results:    Home Health: None   Equipment/Devices: None  Consultations: None  Discharge Condition: Stable    CODE STATUS: Full    Diet Recommendation: Heart Healthy   CC - SOB  Brief history of present illness from the day of admission and additional interim summary     62 y.o. male with medical history significant for prior CVA, paroxysmal atrial fibrillation not on oral anticoagulation, asthma, HIV with noncompliance, history of pulmonary embolism, who presents to [facility] ED with complaints of 6 weeks of left-sided weakness involving his left arm and left leg.  Associated with frequent falls.  Has not been taking his ART medications.  MRI of the brain showing right-sided T2 hypertense signal on the MRI primarily cortical/subcortical, in the right greater than left insula and postcentral gyrus, neurology and ID were consulted and he was admitted to the hospital for further care.   Patient had improved was transferred to CIR on 07/14/2022 subsequently at [facility] he developed some chest discomfort after which she was found to have a large PE on CTA and transferred back to [facility] acute care side.  Note patient has previous history of PE as well but had been off anticoagulation for several years, he was on heparin prophylactic dose while he was in the hospital.                                                                   Hospital Course   Acute PE and right lower extremity DVT.  History of PE in the past, was not on chronic anticoagulation anymore question compliance being the issue, he is currently on heparin drip, hemodynamically stable and currently symptom-free, troponin negative, stable echocardiogram. Continue to monitor on heparin drip for another 24 hours, start Eliquis on 07/17/2022.  Continue to encourage sitting in chair using I-S and flutter valve for pulmonary toiletry in the daytime, continue supplemental oxygen 2 to 3 L for the next few weeks on a as needed basis.  Clinically stable for discharge back to CIR.  -With an episode of chest pain yesterday, appears to be musculoskeletal/pleuritic, his troponin is reassuringly negative x 2, EKG nonacute, I have informed him that he might be having some small amount of pleuritic chest pain in the setting of his acute PE, but that should subside with  Left-sided weakness leg significantly more weak than the arm PML likely, positive CSF for JC Virus PCR .  MRI of the brain suggestive of T2 hypertense signal primarily cortical/subcortical, in the right greater than left insula and postcentral gyrus, neurology and ID on board, he was noncompliant with his HIV medications.  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  Continue supportive care with PT OT.  Left lower extremity weakness much improved since 07/12/2022, left upper extremity now minimally weak only.  Was discharged to CIR.      HIV - AIDs.  CD4 count less than 54 with CD4 helper T-cell percentage 5%.  Noncompliant with medications, counseled on compliance, ID on board defer management to ID.  He is now  agreeable to taking his HIV medications as prescribed, see above.   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  Per ID requested ENT input, per ENT CT guided biopsy by IR if required or else outpatient ENT follow-up.  Discussed with Dr. Murlean Hark and ID physician Dr. Gwynn Burly.  He is s/p CT-guided biopsy of both parotid glands by IR on 07/10/2022, bilateral parotid gland FNAC done by IR appears unremarkable, prelim results noted on 07/12/2022, CSF autoimmune panel appears negative, CSF flow cytometry appears stable as well.   Dyslipidemia.  Placed on statin.   Asthma.  Stable no acute issues.   Obesity.  BMI 32.  Follow-up with PCP for weight loss.   Blood culture 1 out of 2 specimens positive for Staph species.  Likely contamination.  Monitor.    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  Get CBC, CMP, 2 view Chest X ray -  checked next visit with your primary MD or CIR MD   Activity: As tolerated with Full fall precautions use walker/cane & assistance as needed  Disposition CIR  Diet: Heart Healthy    Special Instructions: If you have smoked or chewed Tobacco  in the last 2 yrs please stop smoking, stop any regular Alcohol  and or any Recreational drug use.  On your next visit with your primary care physician please Get Medicines reviewed and adjusted.  Please request your Prim.MD to go over all Hospital Tests and Procedure/Radiological  results at the follow up, please get all Hospital records sent to your Prim MD by signing hospital release before you go home.  If you experience worsening of your admission symptoms, develop shortness of breath, life threatening emergency, suicidal or homicidal thoughts you must seek medical attention immediately by calling 911 or calling your MD immediately  if symptoms less severe.  You Must read complete instructions/literature along with all the  possible adverse reactions/side effects for all the Medicines you take and that have been prescribed to you. Take any new Medicines after you have completely understood and accpet all the possible adverse reactions/side effects.   Increase activity slowly   Complete by: As directed    No wound care   Complete by: As directed        Discharge Medications   Allergies as of 07/19/2022       Reactions   Asa [aspirin] Other (See Comments)   Epistaxis        Medication List     TAKE these medications    acetaminophen 325 MG tablet Commonly known as: TYLENOL Take 2 tablets (650 mg total) by mouth every 6 (six) hours as needed for mild pain or moderate pain (or Fever >/= 101).   albuterol 108 (90 Base) MCG/ACT inhaler Commonly known as: VENTOLIN HFA 1 puff every 4 (four) hours as needed for wheezing or shortness of breath.   albuterol (2.5 MG/3ML) 0.083% nebulizer solution Commonly known as: PROVENTIL Take 2.5 mg by nebulization every 4 (four) hours.   apixaban 5 MG Tabs tablet Commonly known as: ELIQUIS Take 2 tablets (10 mg total) by mouth 2 (two) times daily for 5 days.   apixaban 5 MG Tabs tablet Commonly known as: ELIQUIS Take 1 tablet (5 mg total) by mouth 2 (two) times daily. Start taking on: July 24, 2022   ascorbic acid 500 MG tablet Commonly known as: VITAMIN C Take 1 tablet (500 mg total) by mouth daily.   Biktarvy 50-200-25 MG Tabs tablet Generic drug: bictegravir-emtricitabine-tenofovir AF Take 1 tablet by  mouth daily.   cyanocobalamin 1000 MCG tablet Commonly known as: VITAMIN B12 Take 1 tablet (1,000 mcg total) by mouth daily.   oxyCODONE-acetaminophen 10-325 MG tablet Commonly known as: PERCOCET Take 1 tablet by mouth every 6 (six) hours.   rosuvastatin 10 MG tablet Commonly known as: CRESTOR Take 1 tablet (10 mg total) by mouth daily.   sulfamethoxazole-trimethoprim 800-160 MG tablet Commonly known as: BACTRIM DS Take 1 tablet by mouth daily.   valACYclovir 1000 MG tablet Commonly known as: Valtrex Take 1 tablet (1,000 mg total) by mouth 3 (three) times daily for 9 days.         Follow-up Information     Jose Epley, [name]. Schedule an appointment as soon as possible for a visit in 1 week(s).   Specialty: Family Medicine Contact information: [address] [place] [address] [phone]         Daiva Eves, Lisette Grinder, MD. Schedule an appointment as soon as possible for a visit in 2 week(s).   Specialty: Infectious Diseases Contact information: [address] [place] [address] [phone]                 Major procedures and Radiology Reports - PLEASE review detailed and final reports thoroughly  -     DG CHEST PORT 1 VIEW  Result Date: 07/18/2022 CLINICAL DATA:  Chest pain EXAM: PORTABLE CHEST 1 VIEW COMPARISON:  07/14/2022 FINDINGS: Shallow inspiration. Linear atelectasis in the mid and lower lungs. No pleural effusions. No pneumothorax. Mediastinal contours appear intact. IMPRESSION: Shallow inspiration with linear atelectasis in the lungs. Electronically Signed   By: Burman Nieves M.D.   On: 07/18/2022 22:23   ECHOCARDIOGRAM COMPLETE  Result Date: 07/15/2022    ECHOCARDIOGRAM REPORT   Patient Name:   DANGELO MCNIECE Date of Exam: 07/15/2022 Medical Rec #:  [id number]  Height:       71.0 in Accession #:    [id number]     Weight:       209.7 lb Date of Birth:  22-Oct-1960      BSA:          2.151 m Patient Age:    62 years        BP:           135/88 mmHg Patient Gender: M              HR:           96 bpm. Exam Location:  Inpatient Procedure: 2D Echo, Cardiac Doppler and Color Doppler Indications:    Stroke  History:        Patient has no prior history of Echocardiogram examinations,                 most recent 11/27/2019. Arrythmias:Atrial Fibrillation; Risk                 Factors:Hypertension and Diabetes.  Sonographer:    Lucy Antigua Referring Phys: [id number] VISHAL R PATEL  Sonographer Comments: Suboptimal apical window and no subcostal window. Image acquisition challenging due to patient body habitus. Patient complained of subcostal/chest pain. He was not able to depict exactly where the pain was. Wanted to end exam. IMPRESSIONS  1. Left ventricular ejection fraction, by estimation, is 60 to 65%. The left ventricle has normal function. Left ventricular endocardial border not optimally defined to evaluate regional wall motion. There is mild left ventricular hypertrophy. Left ventricular diastolic parameters are indeterminate.  2. Right ventricular systolic function was not well visualized. The right ventricular size is not well visualized. There is normal pulmonary artery systolic pressure.  3. The mitral valve is normal in structure. No evidence of mitral valve regurgitation. No evidence of mitral stenosis.  4. The tricuspid valve is abnormal.  5. The aortic valve is tricuspid. Aortic valve regurgitation is not visualized. No aortic stenosis is present.  6. Aortic dilatation noted. There is mild dilatation of the aortic root, measuring 40 mm. There is moderate dilatation of the ascending aorta, measuring 47 mm. FINDINGS  Left Ventricle: Left ventricular ejection fraction, by estimation, is 60 to 65%. The left ventricle has normal function. Left ventricular endocardial border not optimally defined to evaluate regional wall motion. The left ventricular internal cavity size was normal in size. There is mild left ventricular  hypertrophy. Left ventricular diastolic parameters are indeterminate. Right Ventricle: The right ventricular size is not well visualized. Right vetricular wall thickness was not well visualized. Right ventricular systolic function was not well visualized. There is normal pulmonary artery systolic pressure. The tricuspid regurgitant velocity is 2.69 m/s, and with an assumed right atrial pressure of 3 mmHg, the estimated right ventricular systolic pressure is 31.9 mmHg. Left Atrium: Left atrial size was normal in size. Right Atrium: Right atrial size was normal in size. Pericardium: The pericardium was not well visualized. Mitral Valve: The mitral valve is normal in structure. No evidence of mitral valve regurgitation. No evidence of mitral valve stenosis. Tricuspid Valve: The tricuspid valve is abnormal. Tricuspid valve regurgitation is mild . No evidence of tricuspid stenosis. Aortic Valve: The aortic valve is tricuspid. Aortic valve regurgitation is not visualized. No aortic stenosis is present. Aortic valve mean gradient measures 4.0 mmHg. Aortic valve peak gradient measures 7.2 mmHg. Aortic valve area, by VTI measures 3.15 cm. Pulmonic Valve: The pulmonic valve was not well visualized. Pulmonic  valve regurgitation is trivial. No evidence of pulmonic stenosis. Aorta: Aortic dilatation noted. There is mild dilatation of the aortic root, measuring 40 mm. There is moderate dilatation of the ascending aorta, measuring 47 mm. IAS/Shunts: The interatrial septum was not well visualized.  LEFT VENTRICLE PLAX 2D LVIDd:         3.15 cm   Diastology LVIDs:         2.60 cm   LV e' medial:  10.52 cm/s LV PW:         1.25 cm   LV e' lateral: 8.70 cm/s LV IVS:        1.15 cm LVOT diam:     2.20 cm LV SV:         67 LV SV Index:   31 LVOT Area:     3.80 cm  RIGHT VENTRICLE RV S prime:     20.50 cm/s TAPSE (M-mode): 2.5 cm LEFT ATRIUM             Index        RIGHT ATRIUM           Index LA Vol (A2C):   82.8 ml 38.49 ml/m  RA  Area:     20.50 cm LA Vol (A4C):   52.6 ml 24.45 ml/m  RA Volume:   54.50 ml  25.33 ml/m LA Biplane Vol: 68.5 ml 31.84 ml/m  AORTIC VALVE AV Area (Vmax):    2.92 cm AV Area (Vmean):   2.51 cm AV Area (VTI):     3.15 cm AV Vmax:           134.00 cm/s AV Vmean:          99.200 cm/s AV VTI:            0.211 m AV Peak Grad:      7.2 mmHg AV Mean Grad:      4.0 mmHg LVOT Vmax:         103.00 cm/s LVOT Vmean:        65.500 cm/s LVOT VTI:          0.175 m LVOT/AV VTI ratio: 0.83  AORTA Ao Root diam: 4.00 cm Ao Asc diam:  4.70 cm TRICUSPID VALVE TR Peak grad:   28.9 mmHg TR Vmax:        269.00 cm/s  SHUNTS Systemic VTI:  0.18 m Systemic Diam: 2.20 cm Dina Rich MD Electronically signed by Dina Rich MD Signature Date/Time: 07/15/2022/12:43:16 PM    Final    VAS [place] LOWER EXTREMITY VENOUS (DVT)  Result Date: 07/15/2022  Lower Venous DVT Study Patient Name:  CHIRAG DAIGNEAULT  Date of Exam:   07/15/2022 Medical Rec #: [id number]       Accession #:    [id number] Date of Birth: 1960-09-01       Patient Gender: M Patient Age:   89 years Exam Location:  [facility] Procedure:      VAS [place] LOWER EXTREMITY VENOUS (DVT) Referring Phys: Eston Esters PATEL --------------------------------------------------------------------------------  Indications: Pulmonary embolism.  Anticoagulation: Heparin. Limitations: Poor ultrasound/tissue interface and body habitus. Comparison Study: No previous study. Performing Technologist: McKayla Maag RVT, [place]  Examination Guidelines: A complete evaluation includes B-mode imaging, spectral Doppler, color Doppler, and power Doppler as needed of all accessible portions of each vessel. Bilateral testing is considered an integral part of a complete examination. Limited examinations for reoccurring indications may be performed as noted. The reflux portion of the exam is performed with the patient  in reverse Trendelenburg.  +---------+---------------+---------+-----------+----------+--------------+  RIGHT    CompressibilityPhasicitySpontaneityPropertiesThrombus Aging +---------+---------------+---------+-----------+----------+--------------+ CFV      Full           Yes      Yes                                 +---------+---------------+---------+-----------+----------+--------------+ SFJ      Full                                                        +---------+---------------+---------+-----------+----------+--------------+ FV Prox  Full                                                        +---------+---------------+---------+-----------+----------+--------------+ FV Mid   Full                                                        +---------+---------------+---------+-----------+----------+--------------+ FV DistalFull                                                        +---------+---------------+---------+-----------+----------+--------------+ PFV      Full                                                        +---------+---------------+---------+-----------+----------+--------------+ POP      Full           Yes      Yes                                 +---------+---------------+---------+-----------+----------+--------------+ PTV      Full                                                        +---------+---------------+---------+-----------+----------+--------------+ PERO                                                  Not visualized +---------+---------------+---------+-----------+----------+--------------+ Gastroc  None           No       No                   Acute          +---------+---------------+---------+-----------+----------+--------------+   +---------+---------------+---------+-----------+----------+--------------+  LEFT     CompressibilityPhasicitySpontaneityPropertiesThrombus Aging +---------+---------------+---------+-----------+----------+--------------+ CFV      Full           Yes      Yes                                  +---------+---------------+---------+-----------+----------+--------------+ SFJ      Full                                                        +---------+---------------+---------+-----------+----------+--------------+ FV Prox  Full                                                        +---------+---------------+---------+-----------+----------+--------------+ FV Mid   Full                                                        +---------+---------------+---------+-----------+----------+--------------+ FV DistalFull                                                        +---------+---------------+---------+-----------+----------+--------------+ PFV      Full                                                        +---------+---------------+---------+-----------+----------+--------------+ POP      Full           Yes      Yes                                 +---------+---------------+---------+-----------+----------+--------------+ PTV      Full                                                        +---------+---------------+---------+-----------+----------+--------------+ PERO                                                  Not visualized +---------+---------------+---------+-----------+----------+--------------+     Summary: RIGHT: - Findings consistent with acute deep vein thrombosis involving the right gastrocnemius veins. - Portions of this examination were limited- see technologist comments above. - No cystic structure found in the popliteal fossa.  LEFT: - There is no evidence of deep vein thrombosis in the lower extremity. However, portions  of this examination were limited- see technologist comments above.  - No cystic structure found in the popliteal fossa.  *See table(s) above for measurements and observations. Electronically signed by Gerarda Fraction on 07/15/2022 at 9:39:10 AM.    Final    CT Angio Chest Pulmonary  Embolism (PE) W or WO Contrast  Addendum Date: 07/14/2022   ADDENDUM REPORT: 07/14/2022 23:07 ADDENDUM: Critical Value/emergent results were called by telephone at the time of interpretation on 07/14/2022 at 11:07 pm to provider VISHAL PATEL , who verbally acknowledged these results. Electronically Signed   By: Thornell Sartorius M.D.   On: 07/14/2022 23:07   Result Date: 07/14/2022 CLINICAL DATA:  Pulmonary embolism suspected, high probability. EXAM: CT ANGIOGRAPHY CHEST WITH CONTRAST TECHNIQUE: Multidetector CT imaging of the chest was performed using the standard protocol during bolus administration of intravenous contrast. Multiplanar CT image reconstructions and MIPs were obtained to evaluate the vascular anatomy. RADIATION DOSE REDUCTION: This exam was performed according to the departmental dose-optimization program which includes automated exposure control, adjustment of the mA and/or kV according to patient size and/or use of iterative reconstruction technique. CONTRAST:  75mL OMNIPAQUE IOHEXOL 350 MG/ML SOLN COMPARISON:  04/06/2022. FINDINGS: Cardiovascular: Heart is mildly enlarged and there is a trace pericardial effusion. Scattered coronary artery calcifications are present. There is aneurysmal dilatation of the ascending aorta measuring 4.5 cm. The pulmonary trunk is distended suggesting underlying pulmonary artery hypertension. There are bilateral pulmonary emboli involving lobar, segmental, and subsegmental arteries with moderate-to-large thrombus burden. The right ventricle is distended suggesting underlying pulmonary artery hypertension. Mediastinum/Nodes: No mediastinal, hilar, or axillary lymphadenopathy by size criteria. The thyroid gland, trachea, and esophagus are within normal limits. Lungs/Pleura: Patchy airspace disease is noted bilaterally and most pronounced in the lower lobes. No effusion or pneumothorax. Upper Abdomen: No acute abnormality. Musculoskeletal: Old rib fractures are noted on the  left. There are degenerative changes in the thoracic spine. No acute fracture. Review of the MIP images confirms the above findings. IMPRESSION: 1. Extensive bilateral pulmonary emboli with moderate-to-large thrombus volume and evidence of right heart strain. 2. Distended pulmonary trunk suggesting underlying pulmonary artery hypertension. 3. Patchy airspace disease in the lungs bilaterally, possible atelectasis or infiltrate. 4. Aneurysmal dilatation of the ascending aorta measuring 4.5 cm. Ascending thoracic aortic aneurysm. Recommend semi-annual imaging followup by CTA or MRA and referral to cardiothoracic surgery if not already obtained. This recommendation follows 2010 ACCF/AHA/AATS/ACR/ASA/SCA/SCAI/SIR/STS/SVM Guidelines for the Diagnosis and Management of Patients With Thoracic Aortic Disease. Circulation. 2010; 121: Z610-R604. Aortic aneurysm NOS (ICD10-I71.9) 5. Cardiomegaly with scattered coronary artery calcifications. Electronically Signed: By: Thornell Sartorius M.D. On: 07/14/2022 23:02   DG CHEST PORT 1 VIEW  Result Date: 07/14/2022 CLINICAL DATA:  Tachypnea EXAM: PORTABLE CHEST 1 VIEW COMPARISON:  07/05/2022 FINDINGS: Lung volumes are small and there is bibasilar atelectasis. No superimposed confluent pulmonary infiltrate. No pneumothorax or pleural effusion. Cardiac size within normal limits. Pulmonary vascularity is normal. No acute bone abnormality. IMPRESSION: 1. Pulmonary hypoinflation. Electronically Signed   By: Helyn Numbers M.D.   On: 07/14/2022 20:45   EEG adult  Result Date: 07/13/2022 Charlsie Quest, MD     07/13/2022 10:05 PM Patient Name: Ysidoro Azizi MRN: [id number] Epilepsy Attending: Charlsie Quest Referring Physician/Provider: Leroy Sea, MD Date: 07/13/2022 Duration: 23.28 mins Patient history:  62 year old male with a 6-week history of increasing left-sided weakness and hemisensory deficits getting eeg to evaluate for seizure. Level of alertness: Awake, asleep AEDs  during EEG study: None Technical  aspects: This EEG study was done with scalp electrodes positioned according to the 10-20 International system of electrode placement. Electrical activity was reviewed with band pass filter of 1-70Hz , sensitivity of 7 uV/mm, display speed of 57mm/sec with a 60Hz  notched filter applied as appropriate. EEG data were recorded continuously and digitally stored.  Video monitoring was available and reviewed as appropriate. Description: The posterior dominant rhythm consists of 9 Hz activity of moderate voltage (25-35 uV) seen predominantly in posterior head regions, symmetric and reactive to eye opening and eye closing. Sleep was characterized by vertex waves, sleep spindles (12 to 14 Hz), maximal frontocentral region. Physiologic photic driving was seen during photic stimulation.  Hyperventilation was not performed.   IMPRESSION: This study is within normal limits. No seizures or epileptiform discharges were seen throughout the recording. A normal interictal EEG does not exclude the diagnosis of epilepsy. Priyanka Annabelle Harman   [place] FNA SALIVARY GLAND/PAROTID GLAND  Result Date: 07/11/2022 INDICATION: Indeterminate bilateral parotid cysts. Please perform ultrasound-guided aspiration for tissue diagnostic purposes. EXAM: 1. ULTRASOUND-GUIDED FINE-NEEDLE ASPIRATION OF RIGHT PAROTID CYST 2. ULTRASOUND-GUIDED FINE-NEEDLE ASPIRATION OF THE LEFT PAROTID CYST COMPARISON:  Brain MRI-06/27/2022; cervical spine CT-01/30/2021 MEDICATIONS: None ANESTHESIA/SEDATION: None CONTRAST:  None COMPLICATIONS: None immediate. PROCEDURE: Informed written consent was obtained from the patient after a discussion of the risks, benefits and alternatives to treatment. Preprocedural ultrasound scanning demonstrated an approximately 4.4 x 2.6 cm minimally complex right-sided parotid cyst (image 4), as well as an approximately 3.3 x 2.0 cm left-sided parotid cyst (image 8). A timeout was performed prior to the initiation  of the procedure. The skin overlying the operative sites were prepped and draped in usual sterile fashion. Attention was first paid towards aspiration of the right-sided parotid cyst. After the overlying soft tissues were anesthetized with 1% lidocaine with epinephrine, an 18 gauge trocar needle was advanced into the cyst. Multiple ultrasound images were saved procedural documentation purposes. Next, a proximally 25 cc of tan colored fluid was aspirated from the right parotid cyst. Postprocedural imaging was obtained demonstrating near complete resolution of the right-sided parotid cyst. The identical procedure was then performed left-sided parotid cyst yielding 10 cc of similar appearing tan colored fluid. Postprocedural imaging was obtained demonstrating near complete resolution of the left-sided parotid cyst. All aspirated fluid was capped and sent separately to the laboratory for analysis. Dressings were applied. The patient tolerated the procedure well without immediate postprocedural complication. IMPRESSION: 1. Successful ultrasound-guided aspiration of 25 cc of tan colored fluid from the right-sided parotid cyst. 2. Successful ultrasound-guided aspiration of 10 cc of tan colored fluid from the left-sided parotid cyst. 3. All aspirated fluid was capped and sent separately to the laboratory for analysis. Electronically Signed   By: Simonne Come M.D.   On: 07/11/2022 10:36   [place] FNA BIOPSY SALIVARY GLAND PAROTID GLAND EA ADDT'L LESION  Result Date: 07/11/2022 INDICATION: Indeterminate bilateral parotid cysts. Please perform ultrasound-guided aspiration for tissue diagnostic purposes. EXAM: 1. ULTRASOUND-GUIDED FINE-NEEDLE ASPIRATION OF RIGHT PAROTID CYST 2. ULTRASOUND-GUIDED FINE-NEEDLE ASPIRATION OF THE LEFT PAROTID CYST COMPARISON:  Brain MRI-06/27/2022; cervical spine CT-01/30/2021 MEDICATIONS: None ANESTHESIA/SEDATION: None CONTRAST:  None COMPLICATIONS: None immediate. PROCEDURE: Informed written consent  was obtained from the patient after a discussion of the risks, benefits and alternatives to treatment. Preprocedural ultrasound scanning demonstrated an approximately 4.4 x 2.6 cm minimally complex right-sided parotid cyst (image 4), as well as an approximately 3.3 x 2.0 cm left-sided parotid cyst (image 8). A timeout was performed prior to the  initiation of the procedure. The skin overlying the operative sites were prepped and draped in usual sterile fashion. Attention was first paid towards aspiration of the right-sided parotid cyst. After the overlying soft tissues were anesthetized with 1% lidocaine with epinephrine, an 18 gauge trocar needle was advanced into the cyst. Multiple ultrasound images were saved procedural documentation purposes. Next, a proximally 25 cc of tan colored fluid was aspirated from the right parotid cyst. Postprocedural imaging was obtained demonstrating near complete resolution of the right-sided parotid cyst. The identical procedure was then performed left-sided parotid cyst yielding 10 cc of similar appearing tan colored fluid. Postprocedural imaging was obtained demonstrating near complete resolution of the left-sided parotid cyst. All aspirated fluid was capped and sent separately to the laboratory for analysis. Dressings were applied. The patient tolerated the procedure well without immediate postprocedural complication. IMPRESSION: 1. Successful ultrasound-guided aspiration of 25 cc of tan colored fluid from the right-sided parotid cyst. 2. Successful ultrasound-guided aspiration of 10 cc of tan colored fluid from the left-sided parotid cyst. 3. All aspirated fluid was capped and sent separately to the laboratory for analysis. Electronically Signed   By: Simonne Come M.D.   On: 07/11/2022 10:36   DG Lumbar Puncture Fluoro Guide  Result Date: 07/09/2022 CLINICAL DATA:  Left-sided weakness with concern for encephalitis. Request for image guided lumbar puncture. EXAM: LUMBAR  PUNCTURE UNDER FLUOROSCOPY PROCEDURE: An appropriate skin entry site was determined fluoroscopically. Operator donned sterile gloves and mask. Skin site was marked, then prepped with Betadine, draped in usual sterile fashion, and infiltrated locally with 1% lidocaine. A 6 inch, 20 gauge spinal needle advanced into the thecal sac at L3-4 from a right interlaminar approach. Clear colorless CSF spontaneously returned, with opening pressure of 25 cm water. Approximately 13 ml CSF were collected and divided among 4 sterile vials for the requested laboratory studies. The needle was then removed. The patient tolerated the procedure well and there were no complications. FLUOROSCOPY: Radiation Exposure Index (as provided by the fluoroscopic device): 6.5 mGy Kerma IMPRESSION: Technically successful lumbar puncture under fluoroscopy. This exam was performed by Corrin Parker, [name], and was supervised and interpreted by Dr. Marliss Coots. Electronically Signed   By: Marliss Coots M.D.   On: 07/09/2022 18:03   MR BRAIN W CONTRAST  Result Date: 07/08/2022 CLINICAL DATA:  Headache, no red flags EXAM: MRI HEAD WITH CONTRAST TECHNIQUE: Multiplanar, multiecho pulse sequences of the brain and surrounding structures were obtained with intravenous contrast. CONTRAST:  10mL GADAVIST GADOBUTROL 1 MMOL/ML IV SOLN COMPARISON:  MRI head 07/07/2022 FINDINGS: Axial T1 pre and postcontrast, coronal T1 postcontrast, and coronal T2 sequences obtained. No abnormal parenchymal or meningeal enhancement. Redemonstrated areas of T1 hyperintense signal in the right greater than left insula and frontoparietal region. IMPRESSION: No abnormal parenchymal or meningeal enhancement. Electronically Signed   By: Wiliam Ke M.D.   On: 07/08/2022 20:32   MR BRAIN WO CONTRAST  Result Date: 07/07/2022 CLINICAL DATA:  Transient ischemic attack EXAM: MRI HEAD WITHOUT CONTRAST TECHNIQUE: Multiplanar, multiecho pulse sequences of the brain and surrounding  structures were obtained without intravenous contrast. COMPARISON:  None Available. FINDINGS: Brain: Increased T2 hyperintense signal along the right-greater-than-left insula (series 9, image 27), which is associated with mildly increased signal on diffusion-weighted imaging, with decreased intensity on the ADC map. On the right, this is associated with a more focal area of T1 and T2 hyperintense signal, with surrounding edema (series 8, image 18, series 9, image 35 and series 12,  image 111). T2 hyperintense signal is also noted in right thalamus, extending into the midbrain (series 4, images 24-25), although this could be related to wallerian degeneration. Similar abnormal cortical signal is noted in the right postcentral gyrus (series 9, image 46), which is associated with a similar T2 hyperintense, T1 hypointense area with peripheral edema in the right frontoparietal region (series 9, image 39). Less significant T2 hyperintense signal is noted in the left postcentral gyrus (series 9, image 36), without the more superior cortical involvement. Increased T2 signal in the splenium of the corpus callosum (series 9, image 31) and in the left body (series 9, image 35), which is not definitively associated with restricted diffusion. No acute hemorrhage, mass, mass effect, or midline shift. No hydrocephalus or extra-axial collection. Partial empty sella. Craniocervical junction. No hemosiderin deposition to suggest remote hemorrhage. Vascular: Normal arterial flow voids. Skull and upper cervical spine: Normal marrow signal. Sinuses/Orbits: Mucosal thickening left maxillary sinus. No acute finding in the orbits. Other: The mastoid air cells are well aerated. Bilateral parotid masses are better seen on the prior CT and are incompletely imaged on this exam IMPRESSION: 1. Abnormal T2 hyperintense signal, primarily cortical/subcortical, in the right greater than left insula and postcentral gyrus, as well as in the splenium and  left body of the corpus callosum and possibly the right thalamus and midbrain. This is nonspecific, and while this could reflect sequela of prior infarcts, it is also concerning for an inflammatory or infectious process, such as PML, autoimmune encephalitis, or herpes simplex encephalitis, although this is not particularly typical of any the entities. MRI with contrast could be helpful. Correlate with symptoms and consider CSF testing. 2. Bilateral parotid masses are better seen on the prior CT and are incompletely imaged on this exam. These results were called by telephone at the time of interpretation on 07/07/2022 at 10:30 pm to provider [facility] , who verbally acknowledged these results. Electronically Signed   By: Wiliam Ke M.D.   On: 07/07/2022 22:39   DG Chest Port 1 View  Result Date: 07/05/2022 CLINICAL DATA:  Questionable sepsis - evaluate for abnormality EXAM: PORTABLE CHEST 1 VIEW COMPARISON:  April 05, 2022. FINDINGS: Similar enlarged cardiac silhouette. Pulmonary vascular congestion. No overt pulmonary edema. No consolidation. No visible pleural effusions or pneumothorax. Polyarticular degenerative change. IMPRESSION: Similar cardiomegaly and pulmonary vascular congestion without overt pulmonary edema. Electronically Signed   By: Feliberto Harts M.D.   On: 07/05/2022 09:54    Micro Results    Recent Results (from the past 240 hour(s))  SARS Coronavirus 2 by RT PCR (hospital order, performed in [facility] hospital lab) *cepheid single result test* Anterior Nasal Swab     Status: None   Collection Time: 07/14/22  8:25 PM   Specimen: Anterior Nasal Swab  Result Value Ref Range Status   SARS Coronavirus 2 by RT PCR NEGATIVE NEGATIVE Final    Comment: Performed at [facility], [address] [place], [place] [address]  Expectorated Sputum Assessment w Gram Stain, Rflx to Resp Cult     Status: None   Collection Time: 07/14/22  8:27 PM   Specimen: Expectorated Sputum  Result  Value Ref Range Status   Specimen Description EXPECTORATED SPUTUM  Final   Special Requests Immunocompromised  Final   Sputum evaluation   Final    Sputum specimen not acceptable for testing.  Please recollect.   Gram Stain Report Called to,Read Back By and Verified With: RN P. CAMPIONE [phone] @  2248 FH Performed at [facility], [address], [place], [place] [address]    Report Status 07/14/2022 FINAL  Final  Culture, blood (Routine X 2) w Reflex to ID Panel     Status: Abnormal   Collection Time: 07/14/22  8:55 PM   Specimen: BLOOD LEFT ARM  Result Value Ref Range Status   Specimen Description BLOOD LEFT ARM  Final   Special Requests   Final    BOTTLES DRAWN AEROBIC AND ANAEROBIC Blood Culture adequate volume   Culture  Setup Time   Final    GRAM POSITIVE COCCI AEROBIC BOTTLE ONLY CRITICAL RESULT CALLED TO, READ BACK BY AND VERIFIED WITH: PHARMD KIM HURTH [id number] AT 1414 BY EC    Culture (A)  Final    STAPHYLOCOCCUS HOMINIS THE SIGNIFICANCE OF ISOLATING THIS ORGANISM FROM A SINGLE SET OF BLOOD CULTURES WHEN MULTIPLE SETS ARE DRAWN IS UNCERTAIN. PLEASE NOTIFY THE MICROBIOLOGY DEPARTMENT WITHIN ONE WEEK IF SPECIATION AND SENSITIVITIES ARE REQUIRED. Performed at [facility], [address], [place], [place] [address]    Report Status 07/16/2022 FINAL  Final  Blood Culture ID Panel (Reflexed)     Status: Abnormal   Collection Time: 07/14/22  8:55 PM  Result Value Ref Range Status   Enterococcus faecalis NOT DETECTED NOT DETECTED Final   Enterococcus Faecium NOT DETECTED NOT DETECTED Final   Listeria monocytogenes NOT DETECTED NOT DETECTED Final   Staphylococcus species DETECTED (A) NOT DETECTED Final    Comment: CRITICAL RESULT CALLED TO, READ BACK BY AND VERIFIED WITH: PHARMD Malva Cogan [id number] AT 1614 BY EC    Staphylococcus aureus (BCID) NOT DETECTED NOT DETECTED Final   Staphylococcus epidermidis NOT DETECTED NOT DETECTED Final   Staphylococcus lugdunensis  NOT DETECTED NOT DETECTED Final   Streptococcus species NOT DETECTED NOT DETECTED Final   Streptococcus agalactiae NOT DETECTED NOT DETECTED Final   Streptococcus pneumoniae NOT DETECTED NOT DETECTED Final   Streptococcus pyogenes NOT DETECTED NOT DETECTED Final   A.calcoaceticus-baumannii NOT DETECTED NOT DETECTED Final   Bacteroides fragilis NOT DETECTED NOT DETECTED Final   Enterobacterales NOT DETECTED NOT DETECTED Final   Enterobacter cloacae complex NOT DETECTED NOT DETECTED Final   Escherichia coli NOT DETECTED NOT DETECTED Final   Klebsiella aerogenes NOT DETECTED NOT DETECTED Final   Klebsiella oxytoca NOT DETECTED NOT DETECTED Final   Klebsiella pneumoniae NOT DETECTED NOT DETECTED Final   Proteus species NOT DETECTED NOT DETECTED Final   Salmonella species NOT DETECTED NOT DETECTED Final   Serratia marcescens NOT DETECTED NOT DETECTED Final   Haemophilus influenzae NOT DETECTED NOT DETECTED Final   Neisseria meningitidis NOT DETECTED NOT DETECTED Final   Pseudomonas aeruginosa NOT DETECTED NOT DETECTED Final   Stenotrophomonas maltophilia NOT DETECTED NOT DETECTED Final   Candida albicans NOT DETECTED NOT DETECTED Final   Candida auris NOT DETECTED NOT DETECTED Final   Candida glabrata NOT DETECTED NOT DETECTED Final   Candida krusei NOT DETECTED NOT DETECTED Final   Candida parapsilosis NOT DETECTED NOT DETECTED Final   Candida tropicalis NOT DETECTED NOT DETECTED Final   Cryptococcus neoformans/gattii NOT DETECTED NOT DETECTED Final    Comment: Performed at [facility], [address] [place], [place] [address]  Culture, blood (Routine X 2) w Reflex to ID Panel     Status: None   Collection Time: 07/14/22  9:06 PM   Specimen: BLOOD RIGHT ARM  Result Value Ref Range Status   Specimen Description BLOOD RIGHT  ARM  Final   Special Requests   Final    BOTTLES DRAWN AEROBIC AND ANAEROBIC Blood Culture adequate volume   Culture   Final    NO GROWTH 5 DAYS Performed  at [facility], [address] [place], [place] [address]    Report Status 07/19/2022 FINAL  Final    Today   Subjective    Alwaleed Hodgen this morning denies any complaints, he had some chest pain yesterday, pleuritic, negative workup including EKG and troponins x 2.   Objective   Blood pressure 129/84, pulse 93, temperature 98.6 F (37 C), resp. rate (!) 23, weight 96.4 kg, SpO2 93 %.   Intake/Output Summary (Last 24 hours) at 07/19/2022 0953 Last data filed at 07/19/2022 0845 Gross per 24 hour  Intake 240 ml  Output --  Net 240 ml    Exam  Awake Alert, No new F.N deficits, improving minimal left-sided weakness. .AT,PERRAL Supple Neck,   Symmetrical Chest wall movement, Good air movement bilaterally, CTAB RRR,No Gallops,   +ve B.Sounds, Abd Soft, Non tender,  No Cyanosis, Clubbing or edema    Data Review   Recent Labs  Lab 07/14/22 2055 07/15/22 0127 07/16/22 0426 07/17/22 0453 07/18/22 0345  WBC 5.4 5.5 4.8 3.7* 3.9*  HGB 13.1 13.4 12.1* 12.0* 12.3*  HCT 39.4 40.0 35.9* 36.7* 37.2*  PLT 116* 114* 136* 169 206  MCV 86.8 87.5 87.8 87.8 88.8  MCH 28.9 29.3 29.6 28.7 29.4  MCHC 33.2 33.5 33.7 32.7 33.1  RDW 13.3 13.4 13.2 13.2 13.3  LYMPHSABS 1.1 1.1 0.8 0.9 1.3  MONOABS 0.5 0.4 0.5 0.4 0.5  EOSABS 0.0 0.1 0.1 0.2 0.2  BASOSABS 0.0 0.0 0.0 0.0 0.0    Recent Labs  Lab 07/13/22 0709 07/14/22 0622 07/14/22 2055 07/15/22 0127 07/16/22 0426 07/17/22 0453 07/18/22 0345  NA 137 135 136 135 137 137 138  K 3.7 3.5 3.6 3.6 3.6 3.6 3.6  CL 104 107 105 103 102 103 104  CO2 24 20* 18* 21* 20* 24 24  ANIONGAP 9 8 13 11 15 10 10   GLUCOSE 99 106* 118* 118* 101* 97 104*  BUN 7 8 10 10 13 12 13   CREATININE 0.89 0.81 0.90 0.92 0.84 0.99 0.95  AST  --   --  14* 16 14* 14* 14*  ALT  --   --  16 18 14 12 14   ALKPHOS  --   --  62 65 57 60 56  BILITOT  --   --  0.8 0.8 0.7 0.2* 0.5  ALBUMIN  --   --  3.3* 3.3* 2.9* 2.9* 2.9*  PROCALCITON  --   --  <0.10   --   --   --   --   LATICACIDVEN  --   --  0.9  --   --   --   --   BNP 57.0 49.1  --   --  65.7 35.1 29.5  MG 2.1 2.1  --   --  2.1 2.1 2.0  CALCIUM 8.5* 8.6* 8.8* 8.9 9.0 9.0 9.3    Total Time in preparing paper work, data evaluation and todays exam - 35 minutes  Signature  -    Mliss Fritz Colonel Krauser M.D on 07/19/2022 at 9:53 AM   -  To page go to [email or web address]   Few days

## 2022-07-19 NOTE — Evaluation (Signed)
Occupational Therapy Assessment and Plan  Patient Details  Name: Jose Lamb MRN: [id number] Date of Birth: 06/28/1960  OT Diagnosis: abnormal posture, hemiplegia affecting non-dominant side, and muscle weakness (generalized) Rehab Potential: Rehab Potential (ACUTE ONLY): Good ELOS: 3 weeks   Today's Date: 07/20/2022 OT Individual Time: 4098-1191 OT Individual Time Calculation (min): 62 min     Hospital Problem: Principal Problem:   PML (progressive multifocal leukoencephalopathy) (HCC)   Past Medical History:  Past Medical History:  Diagnosis Date   Atrial fibrillation (HCC)    In the setting of hyperthyroidism   Bilateral pulmonary embolism (HCC)    Diagnosed August 2021   Diabetes mellitus without complication (HCC)    Essential hypertension    HIV antibody positive (HCC)    Perforated duodenal ulcer (HCC) 2015   Contained without need for surgical intervention   Pneumonia due to COVID-19 virus    Diagnosed August 2021   Past Surgical History:  Past Surgical History:  Procedure Laterality Date   COLONOSCOPY  Sept 2015   [place]: transverse colon polyp with pathology reporting lymphoid nodule    ESOPHAGOGASTRODUODENOSCOPY N/A 03/30/2014   Procedure: ESOPHAGOGASTRODUODENOSCOPY (EGD);  Surgeon: Corbin Ade, MD;  Location: [facility];  Service: Endoscopy;  Laterality: N/A;  215   UMBILICAL HERNIA REPAIR N/A 12/16/2021   Procedure: HERNIA REPAIR UMBILICAL ADULT WITH MESH;  Surgeon: Franky Macho, MD;  Location: [facility];  Service: General;  Laterality: N/A;    Assessment & Plan Clinical Impression: Patient is a 62 year old male presented to [facility] ED on 07/05/2022 reporting a history of multiple falls and a recent stroke. Workup was significant for possible urinary tract infection and hypokalemia.  He was placed on antibiotics and given potassium supplement and discharged home.  He re-presented to the emergency department at [facility] on  07/07/2022 complaining of an approximately 6-week history of left-sided weakness. MRI of the brain revealed abnormal T2 signal of the cortical subcortical and right greater than left insular postcentral gyrus as well as splenium and body of corpus callosum and thalamus and midbrain. Admitted to CIR on 07/14/22 and developed a fever and tachycardia. Pt was transferred back to acute and found to have extensive bilateral pulmonary emboli with moderate-to-large thrombus volume and evidence of right heart strain. Patient transferred to CIR on 07/19/2022 .    Patient currently requires  max-total  with basic self-care skills secondary to muscle weakness, decreased cardiorespiratoy endurance, decreased attention to left and decreased motor planning, decreased initiation, decreased attention, decreased awareness, decreased problem solving, decreased safety awareness, decreased memory, and delayed processing, and decreased sitting balance, decreased standing balance, decreased postural control, hemiplegia, and decreased balance strategies.  Prior to hospitalization, patient could complete all ADL tasks and IADL tasks with independent .  Patient will benefit from skilled intervention to decrease level of assist with basic self-care skills and increase independence with basic self-care skills prior to discharge home with care partner.  Anticipate patient will require 24 hour supervision and follow up home health.  OT - End of Session Activity Tolerance: Decreased this session Endurance Deficit: Yes Endurance Deficit Description: impaired/delayed OT Assessment Rehab Potential (ACUTE ONLY): Good OT Barriers to Discharge: Weight OT Patient demonstrates impairments in the following area(s): Balance;Sensory;Cognition;Endurance;Motor;Safety;Perception OT Basic ADL's Functional Problem(s): Eating;Grooming;Bathing;Dressing;Toileting OT Transfers Functional Problem(s): Toilet;Tub/Shower OT Additional Impairment(s):  Fuctional Use of Upper Extremity OT Plan OT Intensity: Minimum of 1-2 x/day, 45 to 90 minutes OT Frequency: 5 out of 7 days  OT Duration/Estimated Length of Stay: 3 weeks OT Treatment/Interventions: Balance/vestibular training;Disease mangement/prevention;Neuromuscular re-education;Self Care/advanced ADL retraining;Therapeutic Exercise;Wheelchair propulsion/positioning;UE/LE Strength taining/ROM;Pain management;DME/adaptive equipment instruction;Cognitive remediation/compensation;Community reintegration;Functional electrical stimulation;Patient/family education;Splinting/orthotics;UE/LE Coordination activities;Therapeutic Activities;Visual/perceptual remediation/compensation;Psychosocial support;Functional mobility training;Discharge planning OT Self Feeding Anticipated Outcome(s): Mod I   OT Evaluation Precautions/Restrictions  Precautions Precautions: Fall Precaution Comments: Lt hemi, poor sitting/standing balance Restrictions Weight Bearing Restrictions: No  Pain Pain Assessment Pain Scale: 0-10 Pain Score: 0-No pain Home Living/Prior Functioning Home Living Family/patient expects to be discharged to:: Private residence Living Arrangements: Spouse/significant other, Children Available Help at Discharge: Family Type of Home: House Home Access: Stairs to enter Secretary/administrator of Steps: 3 Entrance Stairs-Rails: None Home Layout: Multi-level, Laundry or work area in basement, [employer] on main level, Able to live on main level with bedroom/bathroom Bathroom Shower/Tub: Associate Professor: Yes  Lives With: Spouse, Family Prior Function Level of Independence: Independent with transfers, Independent with gait, Independent with basic ADLs  Able to Take Stairs?: Yes Driving: Yes Vocation: Full time employment Vision Baseline Vision/History: 1 Wears glasses Ability to See in Adequate Light: 0 Adequate Patient Visual Report: No  change from baseline Vision Assessment?: No apparent visual deficits Perception  Perception: Impaired Inattention/Neglect: Other (comment) (decreased left side attention) Spatial Orientation: impaired Praxis Praxis: Impaired Praxis Impairment Details: Motor planning;Ideomotor Cognition Cognition Overall Cognitive Status: Impaired/Different from baseline Arousal/Alertness: Awake/alert Memory: Impaired Awareness: Impaired Problem Solving: Impaired Safety/Judgment: Impaired Brief Interview for Mental Status (BIMS) Repetition of Three Words (First Attempt): 3 Temporal Orientation: Year: Correct Temporal Orientation: Month: Accurate within 5 days Temporal Orientation: Day: Correct Recall: "Sock": Yes, no cue required Recall: "Blue": Yes, no cue required Recall: "Bed": Yes, no cue required BIMS Summary Score: 15 Sensation Sensation Light Touch: Impaired by gross assessment Proprioception: Impaired by gross assessment Additional Comments: Decreased light touch in L hemibody Coordination Gross Motor Movements are Fluid and Coordinated: No Fine Motor Movements are Fluid and Coordinated: No Coordination and Movement Description: impaired Finger Nose Finger Test: impaired 9 Hole Peg Test: NT Motor  Motor Motor: Hemiplegia Motor - Skilled Clinical Observations: L sided hemiplegia  Trunk/Postural Assessment  Cervical Assessment Cervical Assessment: Exceptions to [facility] (forward head) Thoracic Assessment Thoracic Assessment: Exceptions to [facility] (rounded shoulders) Lumbar Assessment Lumbar Assessment: Exceptions to [facility] (anterior pelvic tilit) Postural Control Postural Control: Deficits on evaluation Trunk Control: impaired and delayed Righting Reactions: impaired and delayed Protective Responses: impaired and delayed  Balance Balance Balance Assessed: Yes Static Sitting Balance Static Sitting - Balance Support: Feet supported;Bilateral upper extremity supported Static Sitting -  Level of Assistance: 4: Min assist Dynamic Sitting Balance Dynamic Sitting - Balance Support: Feet supported;During functional activity;Bilateral upper extremity supported Dynamic Sitting - Level of Assistance: 3: Mod assist Static Standing Balance Static Standing - Balance Support: During functional activity;Bilateral upper extremity supported Static Standing - Level of Assistance: 3: Mod assist Dynamic Standing Balance Dynamic Standing - Balance Support: During functional activity;Bilateral upper extremity supported Dynamic Standing - Level of Assistance: 2: Max assist Extremity/Trunk Assessment RUE Assessment Active Range of Motion (AROM) Comments: WFL General Strength Comments: 4-/5 overall in shoulder, elbow (all ranges). Impaired gross grasp. [name] Assessment [name] Assessment: Exceptions to [facility] Active Range of Motion (AROM) Comments: WNL General Strength Comments: 3/5 shoulder, elbow (all ranges). Impaired gross grasp with inattention noted.  Care Tool Care Tool Self Care Eating   Eating Assist Level: Set up assist    Oral Care    Oral Care Assist Level: Set up assist  Bathing   Body parts bathed by patient: Right arm;Left arm;Chest;Abdomen;Front perineal area;Face Body parts bathed by helper: Buttocks;Right upper leg;Left upper leg;Right lower leg;Left lower leg   Assist Level: Moderate Assistance - Patient 50 - 74%    Upper Body Dressing(including orthotics)   What is the patient wearing?: Pull over shirt   Assist Level: Moderate Assistance - Patient 50 - 74%    Lower Body Dressing (excluding footwear)   What is the patient wearing?: Pants Assist for lower body dressing: Dependent - Patient 0%    Putting on/Taking off footwear   What is the patient wearing?: Non-skid slipper socks Assist for footwear: Dependent - Patient 0%       Care Tool Toileting Toileting activity   Assist for toileting: Maximal Assistance - Patient 25 - 49%     Care Tool Bed Mobility        Lying to sitting on side of bed activity   Lying to sitting on side of bed assist level: the ability to move from lying on the back to sitting on the side of the bed with no back support.: Maximal Assistance - Patient 25 - 49%     Care Tool Transfers Sit to stand transfer   Sit to stand assist level: Maximal Assistance - Patient 25 - 49%    Chair/bed transfer   Chair/bed transfer assist level: Maximal Assistance - Patient 25 - 49%     Toilet transfer   Assist Level: Maximal Assistance - Patient 24 - 49%     Care Tool Cognition  Expression of Ideas and Wants Expression of Ideas and Wants: 3. Some difficulty - exhibits some difficulty with expressing needs and ideas (e.g, some words or finishing thoughts) or speech is not clear  Understanding Verbal and Non-Verbal Content Understanding Verbal and Non-Verbal Content: 3. Usually understands - understands most conversations, but misses some part/intent of message. Requires cues at times to understand   Memory/Recall Ability Memory/Recall Ability : Current season;That he or she is in a hospital/hospital unit;Staff names and faces   Refer to Care Plan for Long Term Goals  SHORT TERM GOAL WEEK 1 OT Short Term Goal 1 (Week 1): Patient will complete UB dressing at Min A utilizing hemi dressing techniques OT Short Term Goal 2 (Week 1): Pt will complete toilet transfer with Mod A consistantly to right or left utilizing grab bars and elevated toilet seat. OT Short Term Goal 3 (Week 1): Pt will increase functional use of left UE while using it as a stabilizer during self car tasks and self feeding.  Recommendations for other services: None    Skilled Therapeutic Intervention ADL ADL Eating: Set up Where Assessed-Eating: Wheelchair Grooming: Setup Where Assessed-Grooming: Sitting at sink Upper Body Bathing: Minimal assistance Where Assessed-Upper Body Bathing: Shower Lower Body Bathing: Maximal assistance Where Assessed-Lower Body  Bathing: Shower Upper Body Dressing: Moderate assistance (due to tight fit of scrub top) Where Assessed-Upper Body Dressing: Sitting at sink Lower Body Dressing: Moderate assistance Where Assessed-Lower Body Dressing: Wheelchair Toileting: Moderate assistance Where Assessed-Toileting: Other (Comment) (urinal) Toilet Transfer: Maximal assistance;Moderate assistance (Mod A towards right side; Max A towards left side) Toilet Transfer Method: Stand pivot Acupuncturist: Grab bars;Bedside commode Walk-In Shower Transfer: Maximal cueing;Moderate assistance;Maximal assistance (Mod A towards right side, Max A for left side) Film/video editor Method: Stand pivot [employer]: Grab bars;Other (comment) (BSC) Mobility  Bed Mobility Bed Mobility: Supine to Sit Supine to Sit: Maximal Assistance - Patient - Patient  25-49% Transfers Sit to Stand: Maximal Assistance - Patient 25-49% Stand to Sit: Maximal Assistance - Patient 25-49%  Skilled Intervention: Patient seen this session for ADL re-training and functional transfers. Pt demonstrates decreased left side awareness requiring VC to attend and utilize during self care tasks. Increased time needed to complete functional transfers towards either right or left side due to decreased awareness. Discussed therapy schedule and pt participated in goal setting. Pt educated on hemi dressing techniques during session verbalizing understanding although unable to carry over independently. Half lap tray provided for WC on left side due to left lateral lean and decreased left side awareness.    Discharge Criteria: Patient will be discharged from OT if patient refuses treatment 3 consecutive times without medical reason, if treatment goals not met, if there is a change in medical status, if patient makes no progress towards goals or if patient is discharged from hospital.  The above assessment, treatment plan, treatment alternatives and  goals were discussed and mutually agreed upon: by patient  Limmie Patricia, OTR/L,CBIS  Supplemental OT - [facility] and [facility] Secure Chat Preferred   07/20/2022, 4:18 PM

## 2022-07-19 NOTE — Discharge Summary (Signed)
Physician Discharge Summary  Patient ID: Jose Lamb MRN: [id number] DOB/AGE: 10/29/1960 62 y.o.  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 Pt with subsequent left greater than right weakness and sensory loss particularly involving the lower extremities.  HIV HSV Hyperlipidemia Chronic anemia Asthma Atrial fibrillation DM-2 PE RLE DVT Obesity Constipation  LLE and [name] spasticity  Discharged Condition: stable  Significant Diagnostic Studies:  Narrative & Impression  CLINICAL DATA:  Constipation, left groin pain   EXAM: DG HIP (WITH OR WITHOUT PELVIS) 2-3V LEFT   COMPARISON:  None Available.   FINDINGS: There is no evidence of hip fracture or dislocation. There is no evidence of arthropathy or other focal bone abnormality.   IMPRESSION: Negative.     Electronically Signed   By: Elige Ko M.D.   On: 08/10/2022 13:58      Narrative & Impression  CLINICAL DATA:  Constipation and left groin pain   EXAM: ABDOMEN - 1 VIEW   COMPARISON:  Abdominal radiographs August 01, 2022   FINDINGS: Motion artifact slightly limits evaluation. There is a dilated loop of small bowel in the midabdomen. Nondilated loops of gas and stool-filled colon are present. No supine evidence of free air. Phleboliths overlie the pelvis. No acute osseous abnormality.   IMPRESSION: Dilated loop of small bowel in the midabdomen, which is nonspecific but could represent a developing ileus.     Electronically Signed   By: Jacob Moores M.D.   On: 08/10/2022 13:15    Narrative  & Impression  CLINICAL DATA:  Altered mental status, vomiting   EXAM: PORTABLE CHEST 1 VIEW   COMPARISON:  Previous studies including the examination of 07/18/2022   FINDINGS: Transverse diameter of heart is increased. There are no signs of pulmonary edema. Small linear densities are seen in left lower lung field suggesting scarring or subsegmental atelectasis. There is no focal pulmonary consolidation. There is no significant pleural effusion or pneumothorax.   IMPRESSION: Small linear densities in the left lower lung field may suggest scarring or subsegmental atelectasis. There are no signs of alveolar pulmonary edema or focal pulmonary consolidation.     Electronically Signed   By: Ernie Avena M.D.   On: 08/01/2022 15:31    Narrative & Impression  CLINICAL DATA:  Vomiting   EXAM: ABDOMEN - 1 VIEW   COMPARISON:  None Available.   FINDINGS: Bowel gas pattern is nonspecific. Small to moderate amount of stool is seen in colon. No abnormal masses are seen. There is 5 mm calcific density overlying the left kidney. Kidneys are partly obscured by bowel contents. Degenerative changes are noted in lumbar spine.   IMPRESSION: Nonspecific bowel gas pattern. Possible 5 mm left renal calculus. Lumbar spondylosis.     Electronically Signed   By: Ernie Avena M.D.   On: 08/01/2022 15:29    Narrative & Impression  CLINICAL DATA:  evaluate progression of PML; worsening nausea and tone   EXAM: MRI HEAD WITHOUT AND WITH CONTRAST   TECHNIQUE: Multiplanar, multiecho pulse sequences of the brain and surrounding structures were obtained without and with intravenous contrast.   CONTRAST:  9mL GADAVIST GADOBUTROL 1 MMOL/ML IV SOLN   COMPARISON:  MR  head 07/07/22   FINDINGS: Brain: Negative for an acute infarct. No hemorrhage. No hydrocephalus. No extra-axial fluid collection. Redemonstrated are multifocal regions of cortical and subcortical T2/FLAIR  hyperintense signal abnormality in the bilateral parietal lobes, bilateral insular regions, right thalamocapsular region right superior colliculus, in the periventricular region of the pons. There is no evidence of abnormal contrast enhancement. Enlarged and partially empty sella   Vascular: Normal flow voids.   Skull and upper cervical spine: Normal marrow signal. Likely moderate spinal canal narrowing at C3-C4.   Sinuses/Orbits: No middle ear or mastoid effusion. Paranasal sinuses are clear. Orbits are unremarkable.   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 See prior report for more detailed characterization.   IMPRESSION: 1. No significant change from prior exam.  There is 2. Redemonstration of multifocal regions of cortical and subcortical T2/FLAIR hyperintense lesions in the bilateral parietal lobes, bilateral insular regions, right thalamocapsular region, right superior colliculus, and periventricular region of the pons. No evidence of abnormal contrast enhancement. Given appearance and patient's immunocompromised status, findings could be seen setting of PML or nonspecific encephalitis. 3. Moderate spinal canal narrowing at C3-C4.     Electronically Signed   By: Lorenza Cambridge M.D.   On: 07/31/2022 16:07        Labs:  Basic Metabolic Panel: Recent Labs  Lab 08/10/22 0546 08/11/22 0617 08/12/22 0538 08/13/22 0528 08/14/22 0735  NA 137 135 137 135 134*  K 4.2 4.0 4.0 4.0 3.8  CL 102 101 102 102 102  CO2 25 25 26 24 23   GLUCOSE 122* 107* 112* 111* 103*  BUN 18 19 14 12 14   CREATININE 1.42* 1.39* 1.04 1.05 1.11  CALCIUM 9.5 9.2 9.2 9.2 9.1    CBC: Recent Labs  Lab 08/10/22 0546 08/11/22 1433 08/14/22 0735  WBC 4.3  --  4.3  NEUTROABS 2.0  --   --   HGB 13.7 13.0 12.6*  HCT 41.4  --  37.4*  MCV 89.8  --  88.6  PLT 146*  --  185      Brief HPI:   Jose Lamb is a 62  y.o. male presented to [facility] ED on 07/05/2022 reporting a history of multiple falls and a recent stroke.  Chief complaints were allover soreness and weakness.  Workup was significant for possible urinary tract infection and hypokalemia.  He was placed on antibiotics and given potassium supplement and discharged home.  He re-presented to the emergency department at [facility] on 07/07/2022 complaining of an approximately 6-week history of left-sided weakness.  Notes indicate the patient was sent there for MRI scanning.  Neurology was consulted and MRI of the brain revealed abnormal T2 signal of the cortical subcortical and right greater than left insular postcentral gyrus as well as splenium and body of corpus callosum and thalamus and midbrain.  This was concerning for infectious versus inflammatory process such as PML, autoimmune encephalitis, herpes simplex and enteritis.  Recommendations were for high-volume LP with labs, MRI of the brain with contrast and admission to [facility].  The patient's pertinent past medical history includes HIV, hypertension and diabetes mellitus.  The patient's history of HIV disease is longstanding and he has not been on ART for several years going back to 2017.  His most recent regimen was Descovy, darnunavir/ritonavir and etravirine.  Prior history of bilateral pulmonary embolism 2021, atrial fibrillation in the setting of hyperthyroidism.  The patient is  not chronically anticoagulated.   Infectious disease consultation obtained by Dr. Earlene Plater on 6/01.  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  Given high-dose B12 daily. Imaging was also significant for bilateral parotid masses and ENT consultation placed for biopsy.  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 Grace Isaac.  Negative for malignancy. He was admitted to [facility] on 6/07 and developed fever and tachypnea with tachycardia at approximately 8 pm. He was transferred back to acute bed and found to have extensive bilateral pulmonary emboli with moderate-to-large thrombus volume and evidence of right heart strain. Placed on Eliquis. Tolerating heart healthy diet.   Hospital Course: Jose Lamb was admitted to rehab 07/19/2022 for inpatient therapies to consist of PT, ST and OT at least three hours five days a week. Past admission physiatrist, therapy team and rehab RN have worked together to provide customized collaborative inpatient rehab. Follow-up labs on 6/13 were stable/WNL (hypoalbuminemia). Chest wall pain improved with Lidoderm. Neuropsychology consult on 6/17. While the patient clearly had some cognitive issues most of these were noted having to do with patterns consistent with midbrain and subcortical dysfunction and dysregulation. Patient denied significant depression or anxiety and denied significant coping difficulties with his current hospitalization although he is anxious about how much she will be able to improve even with therapies.   Memory eval perfomed. LLE and [name] spasticity>>added Baclofen 5 mg daily. Anemia resolved. Baclofen increased to 10 mg BID on 6/19.  LLE shooting out. Burden of care remains high; will work more on STS and transfers than higher-level dynamics. Per SLP on full diet working on speech intelligibility, will do memory eval. Patient reported tightness and spasticity in left lower extremity is improving on 6/21. Patient's wife at bedside, had long discussion with her regarding patient's prognosis with PML and likely need for increased and ongoing support at home.  Patient's wife is working on getting the patient Medicaid for home aide services and inquiring as to powered mobility, as she has history of cancer in her back  and is very limited and her ability to assist him.  She is motivated to keep the patient at home if at all possible. C/o nausea with committing this morning. Improved after starting scopolamine patch. Also notes decreased hearing in right ear with tinnitus. Debrox ear drops ordered. Wife updated on functional prognosis with PML, enforced need for ongoing support at home and anticipate some ongoing functional decline. Must stay compliant with antiretroviral therapy. Will discuss with team possibility of powered mobility, given her own functional deficits. C/o of bilateral groin pain. Per wife, increasing lethargy, depression, and mood lability at home prior to admission. Possibly signs of depression secondary to cognitive injury and functional decline. Initiate activating SSRI Lexapro 10 mg daily on 6/23. C/o [name] weakness and spasticity as well as double vision on 6/24. SLP downgraded to D3 diet. MRI brain performed and consulted neurology due to neuro changes. MRI with and without 6/24 without any worsening of PML, low suspicion for IRIS at this time. Urine studies obtained c/w SIADH. KUB and CXR obtained as well as follow-up labs on 6/25. Tramadol and Lexapro discontinued due to possible serotonergic effects causing altered mental status, worsening tone, and emesis.     Blood pressures  were monitored on TID basis and remained stable.  Diabetes has been monitored with BID CBG checks.CBGs discontinued on 6/18.   Rehab course: During patient's stay in rehab weekly team conferences were held to monitor patient's progress, set goals and discuss barriers to discharge. At admission, patient required  max with mobility and max-total  with basic self-care skills.  He has had improvement in activity tolerance, balance, postural control as well as ability to compensate for deficits. He has had improvement in functional use RUE/[name]  and RLE/LLE as well as improvement in awareness.  Discharge disposition: 01-Home or  Self Care     Diet: dysphagia 3, thin liquids  Special Instructions: No driving, alcohol consumption or tobacco use.  Discharge Instructions     Ambulatory referral to Neurology   Complete by: As directed    An appointment is requested in approximately: 4 weeks   Ambulatory referral to Physical Medicine Rehab   Complete by: As directed    Discharge patient   Complete by: As directed    Discharge disposition: 01-Home or Self Care   Discharge patient date: 08/14/2022     Per family request, HIV diagnosis to remain confidential between wife and patient; do not inform other family  Allergies as of 08/14/2022       Reactions   Asa [aspirin] Other (See Comments)   Epistaxis        Medication List     STOP taking these medications    albuterol (2.5 MG/3ML) 0.083% nebulizer solution Commonly known as: PROVENTIL   albuterol 108 (90 Base) MCG/ACT inhaler Commonly known as: VENTOLIN HFA   ascorbic acid 500 MG tablet Commonly known as: VITAMIN C   oxyCODONE-acetaminophen 10-325 MG tablet Commonly known as: PERCOCET   valACYclovir 1000 MG tablet Commonly known as: Valtrex       TAKE these medications    acetaminophen 500 MG tablet Commonly known as: TYLENOL Take 2 tablets (1,000 mg total) by mouth 3 (three) times daily. What changed:  medication strength how much to take when to take this reasons to take this   apixaban 5 MG Tabs tablet Commonly known as: ELIQUIS Take 1 tablet (5 mg total) by mouth 2 (two) times daily. What changed: Another medication with the same name was removed. Continue taking this medication, and follow the directions you see here.   bictegravir-emtricitabine-tenofovir AF 50-200-25 MG Tabs tablet Commonly known as: BIKTARVY Take 1 tablet by mouth daily.   cyanocobalamin 1000 MCG tablet Take 1 tablet (1,000 mcg total) by mouth daily.   escitalopram 10 MG tablet Commonly known as: LEXAPRO Take 1 tablet (10 mg total) by mouth at  bedtime.   ondansetron 4 MG tablet Commonly known as: Zofran Take 1 tablet (4 mg total) by mouth daily as needed for nausea or vomiting.   rosuvastatin 10 MG tablet Commonly known as: CRESTOR Take 1 tablet (10 mg total) by mouth daily.   senna-docusate 8.6-50 MG tablet Commonly known as: Senokot-S Take 2 tablets by mouth 2 (two) times daily as needed for mild constipation.   sulfamethoxazole-trimethoprim 800-160 MG tablet Commonly known as: BACTRIM DS Take 1 tablet by mouth daily.   tiZANidine 2 MG tablet Commonly known as: ZANAFLEX Take 1 tablet (2 mg total) by mouth 3 (three) times daily.        Follow-up Information     [name], [name] Follow up.   Specialty: Family Medicine Why: Call the office in 1-2 days to make arrangements for hospital follow-up  appointment. Contact information: [address] [place] [address] [phone]         Elijah Birk C, DO Follow up.   Specialty: Physical Medicine and Rehabilitation Contact information: [address] [place] [address] [phone]         [facility] Follow up.   Why: Call the office in 1-2 days to make arrangements for hospital follow-up appointment. Contact information: [address]     [address] [place] [address] [phone]        Daiva Eves, Lisette Grinder, MD Follow up.   Specialty: Infectious Diseases Why: Call the office in 1-2 days to make arrangements for hospital follow-up appointment. Contact information: [address] [place] [address] [phone]                 Signed: Milinda Antis 08/14/2022, 9:11 AM

## 2022-07-19 NOTE — TOC Transition Note (Signed)
Transition of Care [facility]) - CM/SW Discharge Note   Patient Details  Name: Jose Lamb MRN: [id number] Date of Birth: 10/23/1960  [facility] CM/SW Contact:  Mearl Latin, LCSW Phone Number: 07/19/2022, 2:33 PM   Clinical Narrative:    Patient discharging to CIR. No other TOC needs identified at this time.    Final next level of care: IP Rehab Facility Barriers to Discharge: No Barriers Identified   Patient Goals and CMS Choice      Discharge Placement                      Patient and family notified of of transfer: 07/19/22  Discharge Plan and Services Additional resources added to the After Visit Summary for                                       Social Determinants of Health (SDOH) Interventions SDOH Screenings   Food Insecurity: No Food Insecurity (07/08/2022)  Housing: Low Risk  (07/08/2022)  Transportation Needs: No Transportation Needs (07/08/2022)  Utilities: Not At Risk (07/08/2022)  Tobacco Use: Low Risk  (07/14/2022)     Readmission Risk Interventions     No data to display

## 2022-07-19 NOTE — Progress Notes (Signed)
Inpatient Rehabilitation  Patient re-admitted to complete Inpatient Rehabilitation Program.  Patient information reviewed and entered into eRehab system by [facility]. Karen Kays., CCC/SLP, PPS Coordinator.  Information including medical coding, functional ability and quality indicators will be reviewed and updated through discharge.

## 2022-07-19 NOTE — Progress Notes (Signed)
PMR Admission Coordinator Pre-Admission Assessment  Patient: Jose Lamb is an 62 y.o., male MRN: [id number] DOB: 10/09/1960 Height:   Weight: 96.4 kg  Insurance Information HMO:     PPO: yes     PCP:      IPA:      80/20:      OTHER:  PRIMARY: BC[employer]      Policy#: [id number]      Subscriber: pt CM Name: Diana Ramirez      Phone#: [phone]     Fax#: [phone] Pre-Cert#: [id number] auth for CIR via fax from Diana Ramirez for 6/11 with updates due to fax listed above on 6/16.        Employer:  Benefits:  Phone #: [phone]     Name:  Eff. Date: 05/08/22     Deduct: $1000 ($0 met)      Out of Pocket Max: $2500 (met $106.51)      Life Max: n/a CIR: 85%      SNF: 85% Outpatient: 85%     Co-Pay: 15% Home Health: 85%      Co-Pay: 15% DME: 85%     Co-Pay: 15% Providers:  SECONDARY:       Policy#:      Phone#:   Financial Counselor:       Phone#:   The "Data Collection Information Summary" for patients in Inpatient Rehabilitation Facilities with attached "Privacy Act Statement-Health Care Records" was provided and verbally reviewed with: N/A  Emergency Contact Information Contact Information     Name Relation Home Work Mobile   Raulston,[name] Spouse [phone]  [phone]   Jackson,Evon Sister   [phone]       Current Medical History  Patient Admitting Diagnosis: 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 male with a history of a prior CVA as well as paroxysmal atrial fibrillation (not on a/c), asthma, HIV with noncompliance, pulmonary embolism who presented on 07/07/2022 with a 6-week history of increasing left-sided weakness of his arm and leg.  He was having frequent falls as well.  Patient reportedly was not taking his meds for HIV.  MRI of the brain was done at the time which demonstrated T2 signal hyperintensity in the right greater than left insula and postcentral gyrus.   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  Patient currently is on Biktarvy and Bactrim per infectious disease. Therapy evaluations completed and pt was recommended for CIR and he was admitted on 6/7 where he quickly developed a fever, tachypnea, and tachycardia at 8pm.  He was transferred back to acute and found to have bilateral pulmonary embolisms with evidence of heart strain.  He was stabilized, transitioned to eliquis, and cleared to return to CIR.    Complete NIHSS TOTAL: 3  Patient's medical record from [place] has been reviewed by the rehabilitation admission coordinator and physician.  Past Medical History  Past Medical History:  Diagnosis Date   Atrial fibrillation (HCC)    In the setting of hyperthyroidism   Bilateral pulmonary embolism (HCC)    Diagnosed August 2021   Diabetes mellitus without complication (HCC)    Essential hypertension    HIV antibody positive (HCC)    Perforated duodenal ulcer (HCC) 2015   Contained without need for surgical intervention   Pneumonia due to COVID-19 virus      Diagnosed August 2021    Has the patient had major surgery during 100 days prior to admission? No  Family History   family history includes Heart Problems in his mother; Ulcers in his mother.  Current Medications  Current Facility-Administered Medications:    acetaminophen (TYLENOL) tablet 650 mg, 650 mg, Oral, Q6H PRN, 650 mg at 07/19/22 0844 **OR** acetaminophen (TYLENOL) suppository 650 mg, 650 mg, Rectal, Q6H PRN, Patel, Vishal R, MD   albuterol (PROVENTIL) (2.5 MG/3ML) 0.083% nebulizer solution 2.5 mg, 2.5 mg, Nebulization, Q4H PRN, Patel, Vishal R, MD   apixaban (ELIQUIS) tablet 10 mg, 10 mg, Oral, BID, 10 mg at 07/19/22 0840 **FOLLOWED BY** [START ON 07/24/2022] apixaban (ELIQUIS) tablet 5 mg, 5 mg, Oral, BID, Pham, Minh  Q, RPH-CPP   bictegravir-emtricitabine-tenofovir AF (BIKTARVY) 50-200-25 MG per tablet 1 tablet, 1 tablet, Oral, Daily, Patel, Vishal R, MD, 1 tablet at 07/19/22 0841   bisacodyl (DULCOLAX) suppository 10 mg, 10 mg, Rectal, Daily PRN, Patel, Vishal R, MD   cyanocobalamin (VITAMIN B12) tablet 1,000 mcg, 1,000 mcg, Oral, Daily, Patel, Vishal R, MD, 1,000 mcg at 07/19/22 0840   ondansetron (ZOFRAN) tablet 4 mg, 4 mg, Oral, Q6H PRN **OR** ondansetron (ZOFRAN) injection 4 mg, 4 mg, Intravenous, Q6H PRN, Patel, Vishal R, MD   rosuvastatin (CRESTOR) tablet 10 mg, 10 mg, Oral, Daily, Patel, Vishal R, MD, 10 mg at 07/19/22 0841   senna-docusate (Senokot-S) tablet 1 tablet, 1 tablet, Oral, QHS PRN, Patel, Vishal R, MD   sodium chloride flush (NS) 0.9 % injection 3 mL, 3 mL, Intravenous, Q12H, Patel, Vishal R, MD, 3 mL at 07/19/22 0842   sulfamethoxazole-trimethoprim (BACTRIM DS) 800-160 MG per tablet 1 tablet, 1 tablet, Oral, Daily, Patel, Vishal R, MD, 1 tablet at 07/19/22 0841   valACYclovir (VALTREX) tablet 1,000 mg, 1,000 mg, Oral, TID, Singh, Prashant K, MD, 1,000 mg at 07/19/22 0841  Patients Current Diet:  Diet Order             Diet - low sodium heart healthy           Diet Heart Room service appropriate? Yes with Assist; Fluid consistency: Thin  Diet effective now                   Precautions / Restrictions Precautions Precautions: Fall Precaution Comments: Lt hemi, poor sitting/standing balance Restrictions Weight Bearing Restrictions: No   Has the patient had 2 or more falls or a fall with injury in the past year? Yes  Prior Activity Level Community (5-7x/wk): prior to 6 weeks ago, fully independent, working full time in heavy machinery, driving, no DME  Prior Functional Level Self Care: Did the patient need help bathing, dressing, using the toilet or eating? Independent  Indoor Mobility: Did the patient need assistance with walking from room to room (with or without  device)? Independent  Stairs: Did the patient need assistance with internal or external stairs (with or without device)? Independent  Functional Cognition: Did the patient need help planning regular tasks such as shopping or remembering to take medications? Independent  Patient Information Are you of Hispanic, Latino/a,or Spanish origin?: A. No, not of Hispanic, Latino/a, or Spanish origin What is your race?: B. Black or African American Do you need or want an interpreter to communicate with a doctor or health care staff?: 0. No  Patient's Response To:  Health Literacy and Transportation Is the patient able to respond to health literacy and transportation needs?: Yes Health Literacy - How often   do you need to have someone help you when you read instructions, pamphlets, or other written material from your doctor or pharmacy?: Never In the past 12 months, has lack of transportation kept you from medical appointments or from getting medications?: No In the past 12 months, has lack of transportation kept you from meetings, work, or from getting things needed for daily living?: No  Home Assistive Devices / Equipment Home Equipment: Wheelchair - power, Cane - single point  Prior Device Use: Indicate devices/aids used by the patient prior to current illness, exacerbation or injury? None of the above  Current Functional Level Cognition  Overall Cognitive Status: Impaired/Different from baseline Current Attention Level: Selective Orientation Level: Oriented X4 Following Commands: Follows one step commands with increased time Safety/Judgement: Decreased awareness of safety, Decreased awareness of deficits General Comments: difficulty sequencing with increased time required    Extremity Assessment (includes Sensation/Coordination)  Upper Extremity Assessment: Generalized weakness, RUE deficits/detail, [name] deficits/detail RUE Deficits / Details: A/ROM WFL. 4-/5 overall in shoulder, elbow (all  ranges). Impaired gross grasp. RUE Sensation: WNL RUE Coordination: WNL [name] Deficits / Details: 3/5 shoulder, elbow (all ranges). Impaired gross grasp with inattention noted. [name] Sensation: decreased light touch [name] Coordination: decreased fine motor, decreased gross motor  Lower Extremity Assessment: LLE deficits/detail LLE Deficits / Details: Increased difficulty with picking up LLE while standing and attempting step pivot transfer towards recliner on the right side. Required physical assist to move left leg. LLE Sensation: decreased light touch    ADLs  Overall ADL's : Needs assistance/impaired Eating/Feeding: Set up, Sitting Grooming: Wash/dry face, Sitting, Supervision/safety Upper Body Bathing: Set up, Sitting Lower Body Bathing: Total assistance, Sit to/from stand Upper Body Dressing : Min guard, Sitting Lower Body Dressing: Total assistance, Sit to/from stand Toilet Transfer: Maximal assistance, Stand-pivot, BSC/3in1, Rolling walker (2 wheels), Cueing for sequencing, Cueing for safety Toilet Transfer Details (indicate cue type and reason): towards right side Toileting- Clothing Manipulation and Hygiene: Total assistance, Sit to/from stand, Cueing for safety, Cueing for sequencing Functional mobility during ADLs: Rolling walker (2 wheels), Cueing for sequencing, Cueing for safety, Moderate assistance    Mobility  Overal bed mobility: Needs Assistance Bed Mobility: Supine to Sit Supine to sit: Min assist, HOB elevated Sit to supine: Min assist, HOB elevated General bed mobility comments: not assessed as patient sitting up on arrival and post session    Transfers  Overall transfer level: Needs assistance Equipment used: Rolling walker (2 wheels) Transfers: Sit to/from Stand Sit to Stand: Mod assist Bed to/from chair/wheelchair/BSC transfer type:: Step pivot Step pivot transfers: Mod assist General transfer comment: lifting and lowering assistance provided with standing from  recliner chair. cues for sequencing and techniques to facilitate independence    Ambulation / Gait / Stairs / Wheelchair Mobility  Ambulation/Gait Pre-gait activities: faciliation for weight shifting to left with decreased weight acceptance and fatigue with standing activity. emphasis on midline and upright posture. patient is able take one step to the left with the left leg while weight shifting to right    Posture / Balance Dynamic Sitting Balance Sitting balance - Comments: Pt initially required BUE to maintain balance. With VC for weight shifting, pt was able to maintain balance for a short period of time while both hands were resting on lap.Unable to keep left leg in safe position while seated while hip preferred to externally rotate throwing off sitting balance. Required physical assist to correct. Balance Overall balance assessment: Needs assistance Sitting-balance support: Feet supported, Bilateral upper   extremity supported Sitting balance-Leahy Scale: Poor Sitting balance - Comments: Pt initially required BUE to maintain balance. With VC for weight shifting, pt was able to maintain balance for a short period of time while both hands were resting on lap.Unable to keep left leg in safe position while seated while hip preferred to externally rotate throwing off sitting balance. Required physical assist to correct. Postural control: Left lateral lean Standing balance support: Bilateral upper extremity supported, During functional activity, Reliant on assistive device for balance Standing balance-Leahy Scale: Poor Standing balance comment: external support required to maintain standing balance    Special needs/care consideration Special service needs ID/HIV   Previous Home Environment (from acute therapy documentation) Living Arrangements: Spouse/significant other, Children, Other relatives Available Help at Discharge: Family Type of Home: House Home Layout: Multi-level, Laundry or work  area in basement, Full bath on main level, Able to live on main level with bedroom/bathroom Home Access: Stairs to enter Entrance Stairs-Rails: None Entrance Stairs-Number of Steps: 2 Bathroom Shower/Tub: Tub/shower unit Bathroom Toilet: Standard Bathroom Accessibility: Yes  Discharge Living Setting Plans for Discharge Living Setting: Patient's home, Lives with (comment) (spouse) Type of Home at Discharge: House Discharge Home Layout: Able to live on main level with bedroom/bathroom Discharge Home Access: Stairs to enter (working on a ramp) Entrance Stairs-Rails: None Entrance Stairs-Number of Steps: 2 Discharge Bathroom Shower/Tub: Tub/shower unit Discharge Bathroom Toilet: Standard Discharge Bathroom Accessibility: Yes How Accessible: Accessible via walker Does the patient have any problems obtaining your medications?: No  Social/Family/Support Systems Patient Roles: Spouse Anticipated Caregiver: [name] (Flo) Butterbaugh (spouse) Anticipated Caregiver's Contact Information: [phone] Ability/Limitations of Caregiver: none stated, likely min assist mobility/ADLs Caregiver Availability: 24/7 Discharge Plan Discussed with Primary Caregiver: Yes Is Caregiver In Agreement with Plan?: Yes Does Caregiver/Family have Issues with Lodging/Transportation while Pt is in Rehab?: No  Goals Patient/Family Goal for Rehab: PT/OT supervision to min assist, SLP n/a Expected length of stay: 12-14 days Additional Information: Discharge plan: return home with pt's spouse 24/7 support, resume Biktarvy Pt/Family Agrees to Admission and willing to participate: Yes Program Orientation Provided & Reviewed with Pt/Caregiver Including Roles  & Responsibilities: Yes  Barriers to Discharge: Medical stability  Decrease burden of Care through IP rehab admission: n/a  Possible need for SNF placement upon discharge: Not anticipated.  Discharge plan: return to pt's home with spouse providing 24/7  supervision/min assist.  Per original consult dated 6/6 pt may potentially reach mod I level.    Patient Condition: I have reviewed medical records from [place], spoken with CSW, and patient and spouse. I met with patient at the bedside and discussed via phone for inpatient rehabilitation assessment.  Patient will benefit from ongoing PT, OT, and SLP, can actively participate in 3 hours of therapy a day 5 days of the week, and can make measurable gains during the admission.  Patient will also benefit from the coordinated team approach during an Inpatient Acute Rehabilitation admission.  The patient will receive intensive therapy as well as Rehabilitation physician, nursing, social worker, and care management interventions.  Due to safety, skin/wound care, disease management, medication administration, pain management, and patient education the patient requires 24 hour a day rehabilitation nursing.  The patient is currently mod to max assist with mobility and basic ADLs.  Discharge setting and therapy post discharge at home with home health is anticipated.  Patient has agreed to participate in the Acute Inpatient Rehabilitation Program and will admit today.  Preadmission Screen Completed By:  Dailynn Nancarrow E   Joon Pohle, PT, DPT 07/19/2022 11:37 AM ______________________________________________________________________   Discussed status with Dr. Raulkar on 07/19/22  at 11:37 AM  and received approval for admission today.  Admission Coordinator:  Shaasia Odle E Mackie Goon, PT, DPT time 11:37 AM /Date 07/19/22    Assessment/Plan: Diagnosis: PML Does the need for close, 24 hr/day Medical supervision in concert with the patient's rehab needs make it unreasonable for this patient to be served in a less intensive setting? 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 Yes Does the patient require coordinated care of a physician, rehab nurse, PT, OT, and SLP to address physical and functional deficits in the context of the above medical diagnosis(es)? Yes Addressing deficits in the following areas: balance, endurance, locomotion, strength, transferring, bowel/bladder control, bathing, dressing, feeding, grooming, toileting, cognition, and psychosocial support Can the patient actively participate in an intensive therapy program of at least 3 hrs of therapy 5 days a week? Yes The potential for patient to make measurable gains while on inpatient rehab is excellent Anticipated functional outcomes upon discharge from inpatient rehab: supervision PT, supervision OT, supervision SLP Estimated rehab length of stay to reach the above functional goals is: 12-16 days Anticipated discharge destination: Home 10. Overall Rehab/Functional Prognosis: excellent   MD Signature: Krutika Raulkar, MD 

## 2022-07-20 ENCOUNTER — Other Ambulatory Visit (HOSPITAL_COMMUNITY): Payer: Self-pay

## 2022-07-20 DIAGNOSIS — A812 Progressive multifocal leukoencephalopathy: Secondary | ICD-10-CM | POA: Diagnosis not present

## 2022-07-20 LAB — CBC WITH DIFFERENTIAL/PLATELET
Abs Immature Granulocytes: 0.02 10*3/uL (ref 0.00–0.07)
Basophils Absolute: 0 10*3/uL (ref 0.0–0.1)
Basophils Relative: 1 %
Eosinophils Absolute: 0.1 10*3/uL (ref 0.0–0.5)
Eosinophils Relative: 1 %
HCT: 39.3 % (ref 39.0–52.0)
Hemoglobin: 13.4 g/dL (ref 13.0–17.0)
Immature Granulocytes: 0 %
Lymphocytes Relative: 20 %
Lymphs Abs: 1.2 10*3/uL (ref 0.7–4.0)
MCH: 29.6 pg (ref 26.0–34.0)
MCHC: 34.1 g/dL (ref 30.0–36.0)
MCV: 86.9 fL (ref 80.0–100.0)
Monocytes Absolute: 0.7 10*3/uL (ref 0.1–1.0)
Monocytes Relative: 12 %
Neutro Abs: 3.9 10*3/uL (ref 1.7–7.7)
Neutrophils Relative %: 66 %
Platelets: 249 10*3/uL (ref 150–400)
RBC: 4.52 MIL/uL (ref 4.22–5.81)
RDW: 13.2 % (ref 11.5–15.5)
WBC: 6 10*3/uL (ref 4.0–10.5)
nRBC: 0 % (ref 0.0–0.2)

## 2022-07-20 LAB — COMPREHENSIVE METABOLIC PANEL
ALT: 22 U/L (ref 0–44)
AST: 16 U/L (ref 15–41)
Albumin: 3.1 g/dL — ABNORMAL LOW (ref 3.5–5.0)
Alkaline Phosphatase: 63 U/L (ref 38–126)
Anion gap: 10 (ref 5–15)
BUN: 16 mg/dL (ref 6–20)
CO2: 22 mmol/L (ref 22–32)
Calcium: 9.5 mg/dL (ref 8.9–10.3)
Chloride: 103 mmol/L (ref 98–111)
Creatinine, Ser: 0.85 mg/dL (ref 0.61–1.24)
GFR, Estimated: >60 mL/min (ref >60)
Glucose, Bld: 115 mg/dL — ABNORMAL HIGH (ref 70–99)
Potassium: 3.8 mmol/L (ref 3.5–5.1)
Sodium: 135 mmol/L (ref 135–145)
Total Bilirubin: 0.8 mg/dL (ref 0.3–1.2)
Total Protein: 8.1 g/dL (ref 6.5–8.1)

## 2022-07-20 MED ORDER — LIDOCAINE 5 % EX PTCH
1.0000 | MEDICATED_PATCH | CUTANEOUS | Status: DC
  Administered 2022-07-20 – 2022-08-13 (×19): 1 via TRANSDERMAL
  Filled 2022-07-20 (×20): qty 1

## 2022-07-20 NOTE — Evaluation (Signed)
Physical Therapy Assessment and Plan  Patient Details  Name: Jose Lamb MRN: [id number] Date of Birth: 04-Dec-1960  PT Diagnosis: Difficulty walking, Hemiplegia non-dominant, Impaired sensation, and Muscle weakness Rehab Potential: Good ELOS: 3 weeks   Today's Date: 07/20/2022 PT Individual Time: 1015-1130 and 4098-1191 PT Individual Time Calculation (min): 75 min and 71 min  Hospital Problem: Principal Problem:   PML (progressive multifocal leukoencephalopathy) (HCC)   Past Medical History:  Past Medical History:  Diagnosis Date   Atrial fibrillation (HCC)    In the setting of hyperthyroidism   Bilateral pulmonary embolism (HCC)    Diagnosed August 2021   Diabetes mellitus without complication (HCC)    Essential hypertension    HIV antibody positive (HCC)    Perforated duodenal ulcer (HCC) 2015   Contained without need for surgical intervention   Pneumonia due to COVID-19 virus    Diagnosed August 2021   Past Surgical History:  Past Surgical History:  Procedure Laterality Date   COLONOSCOPY  Sept 2015   [place]: transverse colon polyp with pathology reporting lymphoid nodule    ESOPHAGOGASTRODUODENOSCOPY N/A 03/30/2014   Procedure: ESOPHAGOGASTRODUODENOSCOPY (EGD);  Surgeon: Corbin Ade, MD;  [facility];  Service: Endoscopy;  Laterality: N/A;  215   UMBILICAL HERNIA REPAIR N/A 12/16/2021   Procedure: HERNIA REPAIR UMBILICAL ADULT WITH MESH;  Surgeon: Franky Macho, MD;  Location: [facility] ORS;  Service: General;  Laterality: N/A;    Assessment & Plan Clinical Impression: Patient is a 62 year old male presented to [facility] ED on 07/05/2022 reporting a history of multiple falls and a recent stroke.  Chief complaints were allover soreness and weakness.  Workup was significant for possible urinary tract infection and hypokalemia.  He was placed on antibiotics and given potassium supplement and discharged home.  He re-presented to the emergency  department at [facility] on 07/07/2022 complaining of an approximately 6-week history of left-sided weakness.  Notes indicate the patient was sent there for MRI scanning.  Neurology was consulted and MRI of the brain revealed abnormal T2 signal of the cortical subcortical and right greater than left insular postcentral gyrus as well as splenium and body of corpus callosum and thalamus and midbrain.  This was concerning for infectious versus inflammatory process such as PML, autoimmune encephalitis, herpes simplex and enteritis.  Recommendations were for high-volume LP with labs, MRI of the brain with contrast and admission to [facility].  The patient's pertinent past medical history includes HIV, hypertension and diabetes mellitus.  The patient's history of HIV disease is longstanding and he has not been on ART for several years going back to 2017.  His most recent regimen was Descovy, darnunavir/ritonavir and etravirine.  Prior history of bilateral pulmonary embolism 2021, atrial fibrillation in the setting of hyperthyroidism.  The patient is not chronically anticoagulated.  Infectious disease consultation obtained by Dr. Earlene Plater on 6/01.  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  Given high-dose B12 daily. Imaging was also significant for bilateral parotid masses and ENT consultation placed for biopsy.  Ultrasound-guided aspiration of bilateral parotid cyst on 6/03 by Dr. Grace Isaac.  Negative for malignancy. He was admitted to [facility] on 6/07 and developed fever and tachypnea with tachycardia at approximately 8 pm.  He was transferred back to acute bed and found to have extensive bilateral pulmonary emboli with moderate-to-large thrombus volume and evidence of right heart strain. Placed on  Eliquis. Tolerating heart healthy diet. 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 Pt with subsequent left greater than right weakness and sensory loss particularly involving the lower extremities.  Patient transferred to CIR on 07/19/2022 .   Patient currently requires max with mobility secondary to muscle weakness, decreased cardiorespiratoy endurance, decreased coordination and decreased motor planning, decreased attention to left, decreased attention and decreased problem solving, and decreased sitting balance, decreased standing balance, decreased postural control, hemiplegia, and decreased balance strategies.  Prior to hospitalization, patient was independent  with mobility and lived with Spouse, Family in a House home.  Home access is 3Stairs to enter.  Patient will benefit from skilled PT intervention to maximize safe functional mobility, minimize fall risk, and decrease caregiver burden for planned discharge home with 24 hour supervision.  Anticipate patient will benefit from follow up HH at discharge.  PT - End of Session Activity Tolerance: Tolerates 30+ min activity with multiple rests Endurance Deficit: Yes PT Assessment Rehab Potential (ACUTE/IP ONLY): Good PT Barriers to Discharge: Decreased caregiver support;Home environment access/layout PT Patient demonstrates impairments in the following area(s): Balance;Endurance;Motor;Pain;Perception;Safety;Sensory;Skin Integrity PT Transfers Functional Problem(s): Bed Mobility;Bed to Chair;Furniture;Car PT Locomotion Functional Problem(s): Ambulation;Wheelchair Mobility;Stairs PT Plan PT Intensity: Minimum of 1-2 x/day ,45 to 90 minutes PT Frequency: 5 out of 7 days PT Duration Estimated Length of Stay: 3 weeks PT Treatment/Interventions: Ambulation/gait  training;Community reintegration;DME/adaptive equipment instruction;Neuromuscular re-education;Psychosocial support;Stair training;UE/LE Strength taining/ROM;Wheelchair propulsion/positioning;Balance/vestibular training;Discharge planning;Functional electrical stimulation;Pain management;Skin care/wound management;Therapeutic Activities;UE/LE Coordination activities;Cognitive remediation/compensation;Functional mobility training;Splinting/orthotics;Therapeutic Exercise;Visual/perceptual remediation/compensation;Disease management/prevention;Patient/family education PT Transfers Anticipated Outcome(s): Supervision PT Locomotion Anticipated Outcome(s): Supervision PT Recommendation Recommendations for Other Services: Therapeutic Recreation consult Therapeutic Recreation Interventions: Stress management;Outing/community reintergration Follow Up Recommendations: Home health PT;24 hour supervision/assistance Patient destination: Home Equipment Recommended: To be determined Equipment Details: tbd   PT Evaluation Precautions/Restrictions Precautions Precautions: Fall Restrictions Weight Bearing Restrictions: No General Chart Reviewed: Yes Family/Caregiver Present: No Vital Signs Pain   Pain Interference Pain Interference Pain Effect on Sleep: 1. Rarely or not at all Pain Interference with Therapy Activities: 1. Rarely or not at all Pain Interference with Day-to-Day Activities: 2. Occasionally Home Living/Prior Functioning Home Living Living Arrangements: Spouse/significant other;Other relatives Available Help at Discharge: Family Type of Home: House Home Access: Stairs to enter [employer] of Steps: 3 Entrance Stairs-Rails: None (pt says rails are being installed bilaterally) Home Layout: Multi-level;Laundry or work area in basement;Full bath on main level;Able to live on main level with bedroom/bathroom  Lives With: Spouse;Family Prior Function Level of Independence:  Independent with transfers;Independent with gait;Independent with basic ADLs  Able to Take Stairs?: Yes Driving: Yes Vocation: Full time employment Vision/Perception  Vision - History Ability to See in Adequate Light: 0 Adequate Perception Perception: Impaired Inattention/Neglect: Other (comment) (slightly decreased L sided attention) Praxis Praxis: Impaired Praxis Impairment Details: Motor planning;Ideomotor  Cognition Overall Cognitive Status: Impaired/Different from baseline Arousal/Alertness: Awake/alert Orientation Level: Oriented X4 Safety/Judgment: Appears intact Sensation Sensation Light Touch: Impaired by gross assessment Proprioception: Impaired by gross assessment Additional Comments: Decreased light touch in L hemibody Coordination Gross Motor Movements are Fluid and Coordinated: No Fine Motor Movements are Fluid and Coordinated: No Heel Shin Test: RLE WFL, unable to attempt with LLE due to  weakness Motor  Motor Motor: Hemiplegia Motor - Skilled Clinical Observations: L sided hemiplegia  Trunk/Postural Assessment  Cervical Assessment Cervical Assessment:  (forward head) Thoracic Assessment Thoracic Assessment:  (rounded shoulders) Lumbar Assessment Lumbar Assessment:  (posterior pelvic tilt) Postural Control Postural Control: Deficits on evaluation (deleayed and inadequate righting reactions)  Balance Balance Balance Assessed: Yes Static Sitting Balance Static Sitting - Balance Support: Feet supported Static Sitting - Level of Assistance: 4: Min assist Dynamic Sitting Balance Dynamic Sitting - Balance Support: Feet supported Dynamic Sitting - Level of Assistance: 3: Mod assist Static Standing Balance Static Standing - Balance Support: During functional activity Static Standing - Level of Assistance: 3: Mod assist Dynamic Standing Balance Dynamic Standing - Balance Support: During functional activity Dynamic Standing - Level of Assistance: 2: Max  assist Extremity Assessment  RLE Assessment RLE Assessment: Exceptions to [facility] Strength Comments: Grossly 4/5 LLE Assessment LLE Assessment: Exceptions to [facility] Strength Comments: Grossly 2+/5  Care Tool Care Tool Bed Mobility Roll left and right activity   Roll left and right assist level: Maximal Assistance - Patient 25 - 49%    Sit to lying activity   Sit to lying assist level: Maximal Assistance - Patient 25 - 49%    Lying to sitting on side of bed activity   Lying to sitting on side of bed assist level: the ability to move from lying on the back to sitting on the side of the bed with no back support.: Maximal Assistance - Patient 25 - 49%     Care Tool Transfers Sit to stand transfer   Sit to stand assist level: Maximal Assistance - Patient 25 - 49%    Chair/bed transfer   Chair/bed transfer assist level: Maximal Assistance - Patient 25 - 49%     Toilet transfer   Assist Level: Maximal Assistance - Patient 24 - 49%    Car transfer   Car transfer assist level: Maximal Assistance - Patient 25 - 49%      Care Tool Locomotion Ambulation   Assist level: 2 helpers Assistive device: Walker-rolling Max distance: 12'  Walk 10 feet activity   Assist level: 2 helpers Assistive device: Walker-rolling   Walk 50 feet with 2 turns activity Walk 50 feet with 2 turns activity did not occur: Safety/medical concerns      Walk 150 feet activity Walk 150 feet activity did not occur: Safety/medical concerns      Walk 10 feet on uneven surfaces activity Walk 10 feet on uneven surfaces activity did not occur: Safety/medical concerns      Stairs Stair activity did not occur: Safety/medical concerns        Walk up/down 1 step activity Walk up/down 1 step or curb (drop down) activity did not occur: Safety/medical concerns      Walk up/down 4 steps activity Walk up/down 4 steps activity did not occur: Safety/medical concerns      Walk up/down 12 steps activity  Walk up/down 12 steps activity did not occur: Safety/medical concerns      Pick up small objects from floor   Pick up small object from the floor assist level: Dependent - Patient 0%    Wheelchair Is the patient using a wheelchair?: Yes Type of Wheelchair: Manual   Wheelchair assist level: Minimal Assistance - Patient > 75% Max wheelchair distance: 58'  Wheel 50 feet with 2 turns activity   Assist Level: Minimal Assistance - Patient > 75%  Wheel 150 feet activity   Assist  Level: Maximal Assistance - Patient 25 - 49%    Refer to Care Plan for Long Term Goals  SHORT TERM GOAL WEEK 1 PT Short Term Goal 1 (Week 1): Pt will complete bed mobility consistently with modA. PT Short Term Goal 2 (Week 1): Pt will complete bed to chair transfer consistently with modA. PT Short Term Goal 3 (Week 1): Pt will ambluate x25' with modA and LRAD. PT Short Term Goal 4 (Week 1): Pt will initiate stair training.  Recommendations for other services: Therapeutic Recreation  Stress management and Outing/community reintegration  Evaluation completed (see details above and below) with education on PT POC and goals and individual treatment initiated with focus on bed mobility, balance, transfers, and ambulation. Pt received seated in WC and agrees to therapy. Reports some abdominal pain and does not provide numerical pain rating. PT provides rest breaks and repositioning to manage pain. WC transport to gym. Pt completes car transfer with maxA and cues for sequencing and significant difficulty with trunk control required to complete transitional movement pivoting hips into car. Following rest break, pt performs sit to stand with RW and maxA with significant posterior bias, requiring increased time and cueing to correct and shift center of gravity anteriorly. Pt ambulates x12' with RW and modA/maxA to facilitate upright posture, progression of LLE (pt able to initiate but fatigues very quickly), and tactile cueing at  popliteal fossa to prevent pt from hyperextending Lt knee. Pt requires +2 to bring WC due to fatiguing and requiring unplanned seated rest break. Pt also begins to flex forward and to the Lt with fatigue. Seated rest break. Pt performs stand pivot transfer onto mat table with maxA and same cues. Pt participates in sitting balance training in short sitting with mirror for visual feedback. Pt noted to have posterior and R sided drift. Able to correct with modA and bring trunk to neutral. Pt also practices transitioning to/from sideleaning on alternating forearms, requiring modA to complete. Stand pivot back to WC with modA/maxA. Pt left seated in WC with all needs within reach and alarm intact.   2nd Session: Pt received supine in bed and agrees to therapy. Reports some abdominal pain. PT provides rest breaks and repositioning and rest breaks to manage pain. Supine to sit with modA and cues for sequencing and positioning at EOB. Pt performs stand pivot transfer to the Rt with modA and cues for anterior weight shift, posture, positioning, and hand placement for safety. WC transport to gym. Pt participates in dance activity in dayroom with other patients, with emphasis on community reintegration, activity tolerance, coordination, and rhythmic movements in BUE and BLEs.   Skilled Therapeutic Intervention Mobility Bed Mobility Bed Mobility: Supine to Sit;Sit to Supine Supine to Sit: Maximal Assistance - Patient - Patient 25-49% Sit to Supine: Maximal Assistance - Patient 25-49% Transfers Transfers: Sit to Stand;Stand to Sit;Stand Pivot Transfers Sit to Stand: Maximal Assistance - Patient 25-49% Stand to Sit: Maximal Assistance - Patient 25-49% Stand Pivot Transfers: Maximal Assistance - Patient 25 - 49% Stand Pivot Transfer Details: Tactile cues for initiation;Tactile cues for posture;Verbal cues for sequencing;Verbal cues for technique;Tactile cues for sequencing Transfer (Assistive device):  None Locomotion  Gait Ambulation: Yes Gait Assistance: 2 Helpers Gait Distance (Feet): 12 Feet Assistive device: Rolling walker Gait Assistance Details: Verbal cues for gait pattern;Verbal cues for sequencing;Tactile cues for weight shifting;Verbal cues for safe use of DME/AE;Tactile cues for posture;Tactile cues for sequencing;Tactile cues for placement Gait Gait: Yes Gait Pattern: Impaired Gait Pattern:  Shuffle (forward flexed posture, decreased stance control on Lt) Gait velocity: decr Stairs / Additional Locomotion Stairs: No Wheelchair Mobility Wheelchair Mobility: Yes Wheelchair Assistance: Minimal assistance - Patient >75% Wheelchair Propulsion: Both upper extremities Wheelchair Parts Management: Needs assistance Distance: 61'   Discharge Criteria: Patient will be discharged from PT if patient refuses treatment 3 consecutive times without medical reason, if treatment goals not met, if there is a change in medical status, if patient makes no progress towards goals or if patient is discharged from hospital.  The above assessment, treatment plan, treatment alternatives and goals were discussed and mutually agreed upon: by patient  Beau Fanny, PT, DPT 07/20/2022, 5:13 PM

## 2022-07-20 NOTE — Progress Notes (Signed)
Inpatient Rehabilitation  Patient information reviewed and entered into eRehab system by Aayana Reinertsen M. Rodolfo Notaro, M.A., CCC/SLP, PPS Coordinator.  Information including medical coding, functional ability and quality indicators will be reviewed and updated through discharge.    

## 2022-07-20 NOTE — Progress Notes (Signed)
Inpatient Rehabilitation Care Coordinator Assessment and Plan Patient Details  Name: Jose Lamb MRN: [id number] Date of Birth: Aug 07, 1960  Today's Date: 07/20/2022  Hospital Problems: Principal Problem:   PML (progressive multifocal leukoencephalopathy) [facility])  Past Medical History:  Past Medical History:  Diagnosis Date   Atrial fibrillation (HCC)    In the setting of hyperthyroidism   Bilateral pulmonary embolism (HCC)    Diagnosed August 2021   Diabetes mellitus without complication (HCC)    Essential hypertension    HIV antibody positive (HCC)    Perforated duodenal ulcer (HCC) 2015   Contained without need for surgical intervention   Pneumonia due to COVID-19 virus    Diagnosed August 2021   Past Surgical History:  Past Surgical History:  Procedure Laterality Date   COLONOSCOPY  Sept 2015   [place]: transverse colon polyp with pathology reporting lymphoid nodule    ESOPHAGOGASTRODUODENOSCOPY N/Jose 03/30/2014   Procedure: ESOPHAGOGASTRODUODENOSCOPY (EGD);  Surgeon: Corbin Ade, MD;  Location: [facility];  Service: Endoscopy;  Laterality: N/Jose;  215   UMBILICAL HERNIA REPAIR N/Jose 12/16/2021   Procedure: HERNIA REPAIR UMBILICAL ADULT WITH MESH;  Surgeon: Franky Macho, MD;  Location: [facility] ORS;  Service: General;  Laterality: N/Jose;   Social History:  reports that he has never smoked. He has never used smokeless tobacco. He reports that he does not drink alcohol and does not use drugs.  Family / Support Systems Marital Status: Married How Long?: 11 years Patient Roles: Spouse Spouse/Significant Other: Jose Lamb (wife) Children: Blended family. Pt has 4 children- Jose Lamb (son; in army overseas), Jose Lamb (son; lives in [place]), Jose Lamb (dtr; lives in [place]), and Jose Lamb (dtr; [place], [place]). Pt wife has children as well, unsure on number. Other Supports: Wife's family Anticipated Caregiver: Pt wife Ability/Limitations of Caregiver: Pt wife is not physically able  to assist due to cancer of the spine and has back issues. Reports she has been through treatment but still has some limitations. She states there will be limited support from family. Her son that lives in the home has cancer as well. 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 Currenlty has STD through employer Legal History/Current Legal Issues: Reports he served 10 yrs (2000-2010) Guardian/Conservator: N/Jose   Abuse/Neglect Abuse/Neglect Assessment Can Be Completed: Yes Physical Abuse: Denies Verbal Abuse: Denies Sexual Abuse: Denies Exploitation of patient/patient's resources: Denies Self-Neglect: Denies  Patient response to: Social Isolation - How often do you feel lonely or isolated from those around you?: Never  Emotional Status Pt's affect, behavior and adjustment status: Pt  in good spirits at time of visit Recent Psychosocial Issues: Denies Psychiatric History: Denies Substance Abuse History: reports occassional etoh. Denies tobacco or rec drug use.  Patient / Family Perceptions, Expectations & Goals Pt/Family understanding of illness & functional limitations: Pt and pt wife have general understanding of care needs Premorbid pt/family roles/activities: Independent Anticipated changes in roles/activities/participation: Assistance with ADLs/IADLs Pt/family expectations/goals: pt goal is to "getting myself to when I  can do things alone."  [employer]: None Premorbid Home Care/DME Agencies: None Transportation available at discharge: TBD Is the patient able to respond to transportation needs?: Yes In the past 12 months, has lack of transportation kept you from medical appointments or from getting medications?: No In the past 12 months, has lack of transportation kept you from meetings, work, or from getting things needed for daily living?: No Resource referrals recommended: Neuropsychology  Discharge Planning Living Arrangements: Spouse/significant other, Children Support Systems: Spouse/significant other, Children Type of Residence: Private residence Insurance Resources: Media planner (specify) Herbalist) Financial Resources: Restaurant manager, fast food Screen Referred: No Living Expenses: Psychologist, sport and exercise Management: Patient Does the patient have any problems obtaining your medications?: No Home Management: Pt wife manages all home care needs Patient/Family Preliminary Plans: TBD Care Coordinator Barriers to Discharge: Decreased caregiver support, Lack of/limited family support, Insurance for SNF coverage, Medication compliance Care Coordinator Anticipated Follow Up Needs: HH/OP  Clinical Impression SW met with pt in room to introduce self, explain role, and discuss discharge process. Pt is not Jose Cytogeneticist. No HCPOA. SW will continue to provide updates.   Jose Lamb Jose Lamb 07/20/2022, 3:27 PM

## 2022-07-20 NOTE — Progress Notes (Signed)
Patient ID: Jose Lamb, male   DOB: December 18, 1960, 62 y.o.   MRN: [id number]  [phone]- SW spoke with pt wife [name] to introduce self, explain role, and discuss discharge process. She reports she is the primary caregiver. States there will be limited support from her family as they all work, and she has health issues (back/cancer of the spine). Her family is not aware of his current medical condition, and they are not ready to discuss. She discusses supports that will be needed. SW informed on limitations due to insurance. Reports she has  applied for Medicaid and SSDI. States he had a phone interview with social security earlier this week. Discusses that she does not have a ramp for her home. SW shared will need to confirm all of his care needs, however, will provide resources for Independent Living program and sitter list. SW will follow-up with updates.   SW left packet in room with pt.   Cecile Sheerer, MSW, LCSWA Office: [phone] Cell: [phone] Fax: [phone]

## 2022-07-20 NOTE — Progress Notes (Signed)
Initial Nutrition Assessment  DOCUMENTATION CODES:   Not applicable  INTERVENTION:  - Add Ensure Max po q day, each supplement provides 150 kcal and 30 grams of protein.   NUTRITION DIAGNOSIS:  No nutrition diagnosis.   REASON FOR ASSESSMENT:   Malnutrition Screening Tool    ASSESSMENT:   62 y.o. 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 PMH includes: afib, bilateral pulmonary embolism, DM, HTN, perforated duodenal ulcer.  Meds reviewed: Vit B12. Labs reviewed: WDL.   The pt reports that he has been eating well since admission. Per record, pt has eaten 50-100% of his meals over the past 7 days. The pt reports mild weight loss. Pt is meeting his needs at this time. RD will sign off.   If any nutritional concerns arise, please re-consult.   NUTRITION - FOCUSED PHYSICAL EXAM:  WDL - no wasting noted.  Diet Order:   Diet Order             Diet Heart Room service appropriate? Yes with Assist; Fluid consistency: Thin  Diet effective now                   EDUCATION NEEDS:      Skin:  Skin Assessment: Skin Integrity Issues: Skin Integrity Issues:: Stage II Stage II: mid sacrum  Last BM:  6/10  Height:   Ht Readings from Last 1 Encounters:  07/19/22 5\' 11"  (1.803 m)    Weight:   Wt Readings from Last 1 Encounters:  07/19/22 92.1 kg    Ideal Body Weight:     BMI:  Body mass index is 28.32 kg/m.  Estimated Nutritional Needs:   Kcal:  2300-2700 kcals  Protein:  115-135 gm  Fluid:  >/= 2.3 L  Bethann Humble, RD, LDN, CNSC.

## 2022-07-20 NOTE — Progress Notes (Signed)
PROGRESS NOTE   Subjective/Complaints:  No events overnight. No acute complaints. Doing well overall. Sleeping, eating "ok". Endorsing some pain in his R lateral thigh, ongoing, burning/tingling.  Vitals stable, labs WNL Last BM 6/12    ROS:  +R thigh pain Denies fevers, chills, N/V, abdominal pain, constipation, diarrhea, SOB, cough, chest pain, new weakness or paraesthesias.    Objective:   DG CHEST PORT 1 VIEW  Result Date: 07/18/2022 CLINICAL DATA:  Chest pain EXAM: PORTABLE CHEST 1 VIEW COMPARISON:  07/14/2022 FINDINGS: Shallow inspiration. Linear atelectasis in the mid and lower lungs. No pleural effusions. No pneumothorax. Mediastinal contours appear intact. IMPRESSION: Shallow inspiration with linear atelectasis in the lungs. Electronically Signed   By: Burman Nieves M.D.   On: 07/18/2022 22:23   Recent Labs    07/18/22 0345 07/20/22 0553  WBC 3.9* 6.0  HGB 12.3* 13.4  HCT 37.2* 39.3  PLT 206 249   Recent Labs    07/18/22 0345 07/20/22 0553  NA 138 135  K 3.6 3.8  CL 104 103  CO2 24 22  GLUCOSE 104* 115*  BUN 13 16  CREATININE 0.95 0.85  CALCIUM 9.3 9.5    Intake/Output Summary (Last 24 hours) at 07/20/2022 0915 Last data filed at 07/20/2022 0753 Gross per 24 hour  Intake 330 ml  Output --  Net 330 ml     Pressure Injury 07/13/22 Sacrum Medial Stage 2 -  Partial thickness loss of dermis presenting as a shallow open injury with a red, pink wound bed without slough. Skin Tear Intergluteal Cleft (Active)  07/13/22 1130  Location: Sacrum  Location Orientation: Medial  Staging: Stage 2 -  Partial thickness loss of dermis presenting as a shallow open injury with a red, pink wound bed without slough.  Wound Description (Comments): Skin Tear Intergluteal Cleft  Present on Admission: No    Physical Exam: Vital Signs Blood pressure (!) 126/91, pulse 94, temperature 98.8 F (37.1 C), temperature  source Oral, resp. rate 16, height 5\' 11"  (1.803 m), weight 92.1 kg, SpO2 95 %.  Constitutional: No apparent distress. Appropriate appearance for age. Sitting in WC in gym.  HENT: No JVD. Neck Supple. Trachea midline. Atraumatic, normocephalic. Eyes: PERRLA. EOMI. Visual fields grossly intact.  Cardiovascular: RRR, no murmurs/rub/gallops. No Edema. Peripheral pulses 2+  Respiratory: CTAB. No rales, rhonchi, or wheezing. On RA.  Abdomen: + bowel sounds, normoactive. No distention or tenderness.  Skin: C/D/I. No apparent lesions. + [name] IV, c/d/I.  MSK:      No apparent deformity.      Strength: 5/5 BL UE RLE 4/5, LLE 3/5.    Neurologic exam:  Cognition: AAO to person, place, time and event.  + Delayed recall + Delayed motor initiation + Mild cognitive deficit  Language: Fluent, No substitutions or neoglisms. No dysarthria.  Insight: Good  insight into current condition.  Mood: Flat affect.  Sensation: To light touch reduced in R hip Reflexes: 2+ in BL UE and LEs. Negative Hoffman's and babinski signs bilaterally.  CN: 2-12 grossly intact.  Coordination: No apparent tremors. No ataxia on FTN, HTS bilaterally.  Spasticity: MAS 0 in all extremities.  Assessment/Plan: 1. Functional deficits which require 3+ hours per day of interdisciplinary therapy in a comprehensive inpatient rehab setting. Physiatrist is providing close team supervision and 24 hour management of active medical problems listed below. Physiatrist and rehab team continue to assess barriers to discharge/monitor patient progress toward functional and medical goals  Care Tool:  Bathing              Bathing assist       Upper Body Dressing/Undressing Upper body dressing        Upper body assist      Lower Body Dressing/Undressing Lower body dressing            Lower body assist       Toileting Toileting    Toileting assist       Transfers Chair/bed transfer  Transfers assist            Locomotion Ambulation   Ambulation assist              Walk 10 feet activity   Assist           Walk 50 feet activity   Assist           Walk 150 feet activity   Assist           Walk 10 feet on uneven surface  activity   Assist           Wheelchair     Assist               Wheelchair 50 feet with 2 turns activity    Assist            Wheelchair 150 feet activity     Assist          Blood pressure (!) 126/91, pulse 94, temperature 98.8 F (37.1 C), temperature source Oral, resp. rate 16, height 5\' 11"  (1.803 m), weight 92.1 kg, SpO2 95 %.  Medical Problem List and Plan: 1. 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 Pt with subsequent left greater than right weakness and sensory loss particularly involving the lower extremities.              -patient may shower             -ELOS/Goals: 12-16 days                  Admit to CIR 2.  Antithrombotics: -DVT/anticoagulation:  Pharmaceutical: Eliquis             -antiplatelet therapy: none   3. 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 Mood/Behavior/Sleep: LCSW to evaluate and provide emotional support             -antipsychotic agents: n/a   5. Neuropsych/cognition: This patient is capable of making decisions on his own behalf.   6. Skin/Wound Care: Routine skin care checks   7. 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 Duration - indefinite per DC summary   14: History of a. fib: continue Eliquis for PE   15: DM-2; A1c = 6.6% on 04/10/2022; (no home meds listed)             -serum glucose range: 89-119             -continue carb modified diet  - No CBGs, monitor BID for now  No results for input(s): "GLUCAP" in the last 72 hours.   16: Acute PE and right lower extremity DVT             -continue Eliquis   17: Obesity: BMI 32    18: Chest pain 6/11>>negative trop x 2; EKG non-acute             -attributed to PE versus musculoskeletal    LOS: 1 days A FACE TO FACE EVALUATION WAS PERFORMED  Angelina Sheriff 07/20/2022, 9:15 AM

## 2022-07-21 ENCOUNTER — Other Ambulatory Visit (HOSPITAL_COMMUNITY): Payer: Self-pay

## 2022-07-21 DIAGNOSIS — A812 Progressive multifocal leukoencephalopathy: Secondary | ICD-10-CM | POA: Diagnosis not present

## 2022-07-21 LAB — GLUCOSE, CAPILLARY
Glucose-Capillary: 115 mg/dL — ABNORMAL HIGH (ref 70–99)
Glucose-Capillary: 126 mg/dL — ABNORMAL HIGH (ref 70–99)
Glucose-Capillary: 110 mg/dL — ABNORMAL HIGH (ref 70–99)

## 2022-07-21 MED ORDER — POLYETHYLENE GLYCOL 3350 17 G PO PACK
17.0000 g | PACK | Freq: Every day | ORAL | Status: DC | PRN
  Administered 2022-07-21 – 2022-07-23 (×2): 17 g via ORAL
  Filled 2022-07-21 (×2): qty 1

## 2022-07-21 MED ORDER — SENNOSIDES-DOCUSATE SODIUM 8.6-50 MG PO TABS
1.0000 | ORAL_TABLET | Freq: Every day | ORAL | Status: DC
  Administered 2022-07-21 – 2022-08-06 (×12): 1 via ORAL
  Filled 2022-07-21 (×15): qty 1

## 2022-07-21 NOTE — Progress Notes (Signed)
Occupational Therapy Session Note  Patient Details  Name: Jose Lamb MRN: [id number] Date of Birth: 1960-08-01  Session 1 Today's Date: 07/21/2022 OT Individual Time: 9629-5284 OT Individual Time Calculation (min): 35 min    Session 2 Today's Date: 07/21/2022 OT Individual Time: 1400-1430 OT Individual Time Calculation (min): 30 min    Short Term Goals: Week 1:  OT Short Term Goal 1 (Week 1): Patient will complete UB dressing at Min A utilizing hemi dressing techniques OT Short Term Goal 2 (Week 1): Pt will complete toilet transfer with Mod A consistantly to right or left utilizing grab bars and elevated toilet seat. OT Short Term Goal 3 (Week 1): Pt will increase functional use of left UE while using it as a stabilizer during self car tasks and self feeding.  Skilled Therapeutic Interventions/Progress Updates:    Session 1 Pt received supine with mild pain reported in his R flank but un-rated and no request for intervention. He requested to take a shower. Session only 30 min so increased assist provided to account for time constraints. Throughout the session he required frequent cueing for midline orientation and [name]/LLE positioning. He demonstrated poor midline awareness and L inattention. He came to EOB with mod A for positioning L hips and LE. Stand pivot with the RW with mod +2 assist for safety. Stand pivot into the shower, using BSC for shower chair with BUE support on the grab, heavy max A for trunk control. He demonstrated very poor sitting balance with strong L lean throughout. Attempted to correct multiple times but d/t slipperiness of shower, he was unable to correct. He washed UB with cueing for L attention/body parts. Mod A overall for LB and UB seated. Stand pivot back to the w/c with max A. He donned a shirt with min A, requiring mod cueing for hemi techniques and L attention. He completed LB dressing with max A- placed pt in front of the mirror for visual feedback and he  required a combo of this and tactile cueing to bring his trunk to midline. He was left sitting up with all needs met- pillow used as L lateral trunk support seated for safety. Chair alarm set.    Session 2 Pt received supine with no c/o pain, agreeable to OT session. Session focused on sitting balance and midline orientation. He came to EOB with CGA, requiring cueing for positioning. Mirror placed anterior to provide visual feedback. Slow, intentional cueing used for self correction of midline orientation- he started out with requiring as much as max A to reduce posterior and L lean but was able to work up to CGA. He completed functional reaching activity with the R/L UE to the R to promote awareness and increased spontaneous R righting reactions. He was able to work up to R lateral leans with return to midline in mass practice. Pt benefited most from cueing to put hands on knees vs anterior trunk lean. He completed 1x stand with mod-max A initially and declining to only needing CGA-min A with heavy cueing and tactile cues. He returned to supine and was left with all needs met, bed alarm set.     Therapy Documentation Precautions:  Precautions Precautions: Fall Precaution Comments: Lt hemi, poor sitting/standing balance Restrictions Weight Bearing Restrictions: No  Therapy/Group: Individual Therapy  Crissie Reese 07/21/2022, 6:20 AM

## 2022-07-21 NOTE — Evaluation (Signed)
Speech Language Pathology Assessment and Plan  Patient Details  Name: Jose Lamb MRN: [id number] Date of Birth: 1960-06-23  SLP Diagnosis: Cognitive Impairments;Dysarthria;Dysphagia  Rehab Potential: Excellent ELOS: 3 weeks    Today's Date: 07/21/2022 SLP Individual Time: 1030-1130 SLP Individual Time Calculation (min): 60 min   Hospital Problem: Principal Problem:   PML (progressive multifocal leukoencephalopathy) (HCC)  Past Medical History:  Past Medical History:  Diagnosis Date   Atrial fibrillation (HCC)    In the setting of hyperthyroidism   Bilateral pulmonary embolism (HCC)    Diagnosed August 2021   Diabetes mellitus without complication (HCC)    Essential hypertension    HIV antibody positive (HCC)    Perforated duodenal ulcer (HCC) 2015   Contained without need for surgical intervention   Pneumonia due to COVID-19 virus    Diagnosed August 2021   Past Surgical History:  Past Surgical History:  Procedure Laterality Date   COLONOSCOPY  Sept 2015   [place]: transverse colon polyp with pathology reporting lymphoid nodule    ESOPHAGOGASTRODUODENOSCOPY N/A 03/30/2014   Procedure: ESOPHAGOGASTRODUODENOSCOPY (EGD);  Surgeon: Corbin Ade, MD;  Location: [facility];  Service: Endoscopy;  Laterality: N/A;  215   UMBILICAL HERNIA REPAIR N/A 12/16/2021   Procedure: HERNIA REPAIR UMBILICAL ADULT WITH MESH;  Surgeon: Franky Macho, MD;  Location: [facility];  Service: General;  Laterality: N/A;    Assessment / Plan / Recommendation Clinical Impression Patient is a 62 y.o. male with a history of a prior CVA as well as paroxysmal atrial fibrillation (not on a/c), asthma, HIV with noncompliance, pulmonary embolism who presented on 07/07/2022 with a 6-week history of increasing left-sided weakness of his arm and leg.  He was having frequent falls as well.  Patient reportedly was not taking his meds for HIV.  MRI of the brain was done at the time which demonstrated T2  signal hyperintensity in the right greater than left insula and postcentral gyrus.  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 Therapy evaluations completed and pt was recommended for CIR and he was admitted on 6/7 where he quickly developed a fever, tachypnea, and tachycardia.  He was transferred back to acute and found to have bilateral pulmonary embolisms with evidence of heart strain.  He was stabilized, transitioned to eliquis, and cleared to return to CIR.  Patient re-admitted 07/19/22.  Upon arrival, patient was awake while upright in the bed. Patient endorsed physical deficits since admission but felt he was at his cognitive baseline. SLP administered the COGNISTAT in which patient demonstrated mild impairments in reasoning and moderate deficits with visual perceptual tasks. Throughout informal assessment, patient noted to have deficits in awareness. Patient also with decreased intelligibility at the sentence level due to poor coordination resulting in imprecise consonants.   Of note, patient observed with thin liquids via straw. Patient with overt coughing which patient reports has been happening more frequently but only with liquids.  Coughing eliminated when straw removed and patient able to take smaller and more controlled sips. Recommend patient continue regular textures.  Patient would benefit from skilled SLP intervention to maximize his cognitive and swallowing function as well as his speech intelligibility prior to discharge.    Skilled Therapeutic Interventions          Administered a cognitive-linguistic evaluation and BSE, please see above for details.  SLP Assessment  Patient will need skilled Speech Lanaguage Pathology Services during CIR admission    Recommendations  SLP Diet Recommendations: Age appropriate regular  solids;Thin Liquid Administration via: Cup;No straw Medication Administration: Whole meds with liquid Supervision: Patient able to self feed Compensations: Minimize environmental distractions;Slow rate;Small sips/bites Postural Changes and/or Swallow Maneuvers: Seated upright 90 degrees Oral Care Recommendations: Oral care BID Recommendations for Other Services: Neuropsych consult Patient destination: Home Follow up Recommendations: 24 hour supervision/assistance;Home Health SLP;Outpatient SLP Equipment Recommended: None recommended by SLP    SLP Frequency 1 to 3 out of 7 days   SLP Duration  SLP Intensity  SLP Treatment/Interventions 3 weeks  Minumum of 1-2 x/day, 30 to 90 minutes  Cognitive remediation/compensation;Cueing hierarchy;Dysphagia/aspiration precaution training;Environmental controls;Functional tasks;Internal/external aids;Patient/family education;Speech/Language facilitation;Therapeutic Activities    Pain No/Denies Pain   SLP Evaluation Cognition Overall Cognitive Status: Impaired/Different from baseline Arousal/Alertness: Awake/alert Orientation Level: Oriented X4 Memory: Appears intact Awareness: Impaired Awareness Impairment: Emergent impairment Problem Solving: Impaired Problem Solving Impairment: Functional complex Safety/Judgment: Impaired  Comprehension Auditory Comprehension Overall Auditory Comprehension: Appears within functional limits for tasks assessed Expression Expression Primary Mode of Expression: Verbal Verbal Expression Overall Verbal Expression: Appears within functional limits for tasks assessed Written Expression Dominant Hand: Right Written Expression: Not tested Oral Motor Oral Motor/Sensory Function Overall Oral Motor/Sensory Function: Within functional limits Motor Speech Overall Motor Speech: Impaired Respiration: Within functional limits Phonation: Normal Resonance: Within functional limits Articulation: Impaired Level  of Impairment: Sentence Intelligibility: Intelligibility reduced Motor Planning: Witnin functional limits Effective Techniques: Slow rate;Over-articulate  Care Tool Care Tool Cognition Ability to hear (with hearing aid or hearing appliances if normally used Ability to hear (with hearing aid or hearing appliances if normally used): 1. Minimal difficulty - difficulty in some environments (e.g. when person speaks softly or setting is noisy)   Expression of Ideas and Wants Expression of Ideas and Wants: 3. Some difficulty - exhibits some difficulty with expressing needs and ideas (e.g, some words or finishing thoughts) or speech is not clear   Understanding Verbal and Non-Verbal Content Understanding Verbal and Non-Verbal Content: 3. Usually understands - understands most conversations, but misses some part/intent of message. Requires cues at times to understand  Memory/Recall Ability Memory/Recall Ability : Current season;That he or she is in a hospital/hospital unit;Staff names and faces   Bedside Swallowing Assessment General Date of Onset: 07/05/22 Previous Swallow Assessment: N/A Diet Prior to this Study: Regular;Thin liquids (Level 0) Temperature Spikes Noted: No Respiratory Status: Room air Behavior/Cognition: Alert;Cooperative;Pleasant mood Oral Cavity - Dentition: Adequate natural dentition Self-Feeding Abilities: Able to feed self Patient Positioning: Upright in bed Baseline Vocal Quality: Normal Volitional Cough: Strong Volitional Swallow: Able to elicit  Ice Chips Ice chips: Not tested Thin Liquid Thin Liquid: Impaired Presentation: Cup;Self Fed;Straw Pharyngeal  Phase Impairments: Cough - Immediate Nectar Thick Nectar Thick Liquid: Not tested Honey Thick Honey Thick Liquid: Not tested Puree Puree: Not tested Solid Solid: Not tested BSE Assessment Risk for Aspiration Impact on safety and function: Mild aspiration risk  Short Term Goals: Week 1: SLP Short Term  Goal 1 (Week 1): Patient will consume current diet with supervision level verbal cues for use of swallowing compensatory strategies. SLP Short Term Goal 2 (Week 1): Patient will utilize speech intelligibility strategies at the sentence level to achieve ~90% intelligibility with Min verbal cues. SLP Short Term Goal 3 (Week 1): Patient will demonstrate functional problem solving for mildly complex tasks with Min verbal cues.  Refer to Care Plan for  Long Term Goals  Recommendations for other services: Neuropsych and Therapeutic Recreation  Pet therapy  Discharge Criteria: Patient will be discharged from SLP if patient refuses treatment 3 consecutive times without medical reason, if treatment goals not met, if there is a change in medical status, if patient makes no progress towards goals or if patient is discharged from hospital.  The above assessment, treatment plan, treatment alternatives and goals were discussed and mutually agreed upon: by patient  Donney Caraveo 07/21/2022, 12:46 PM

## 2022-07-21 NOTE — Plan of Care (Signed)
  Problem: RH Swallowing Goal: LTG Patient will consume least restrictive diet using compensatory strategies with assistance (SLP) Description: LTG:  Patient will consume least restrictive diet using compensatory strategies with assistance (SLP) Flowsheets (Taken 07/21/2022 1251) LTG: Pt Patient will consume least restrictive diet using compensatory strategies with assistance of (SLP): Modified Independent   Problem: RH Expression Communication Goal: LTG Patient will increase speech intelligibility (SLP) Description: LTG: Patient will increase speech intelligibility at word/phrase/conversation level with cues, % of the time (SLP) Flowsheets (Taken 07/21/2022 1251) LTG: Patient will increase speech intelligibility (SLP): Modified Independent Level: Conversation level   Problem: RH Problem Solving Goal: LTG Patient will demonstrate problem solving for (SLP) Description: LTG:  Patient will demonstrate problem solving for basic/complex daily situations with cues  (SLP) Flowsheets (Taken 07/21/2022 1251) LTG: Patient will demonstrate problem solving for (SLP): Complex daily situations LTG Patient will demonstrate problem solving for: Modified Independent

## 2022-07-21 NOTE — Care Management (Signed)
[facility] Individual Statement of Services  Patient Name:  Jose Lamb  Date:  07/21/2022  Welcome to the [facility]patient [facility].  Our goal is to provide you with an individualized program based on your diagnosis and situation, designed to meet your specific needs.  With this comprehensive rehabilitation program, you will be expected to participate in at least 3 hours of rehabilitation therapies Monday-Friday, with modified therapy programming on the weekends.  Your rehabilitation program will include the following services:  Physical Therapy (PT), Occupational Therapy (OT), Speech Therapy (ST), 24 hour per day rehabilitation nursing, Therapeutic Recreaction (TR), Psychology, Neuropsychology, Care Coordinator, Rehabilitation Medicine, Nutrition Services, Pharmacy Services, and Other  Weekly team conferences will be held on Tuesdays to discuss your progress.  Your Inpatient Rehabilitation Care Coordinator will talk with you frequently to get your input and to update you on team discussions.  Team conferences with you and your family in attendance may also be held.  Expected length of stay: 3 weeks    Overall anticipated outcome: Minimal Assistance  Depending on your progress and recovery, your program may change. Your Inpatient Rehabilitation Care Coordinator will coordinate services and will keep you informed of any changes. Your Inpatient Rehabilitation Care Coordinator's name and contact numbers are listed  below.  The following services may also be recommended but are not provided by the [facility]:  Driving Evaluations Home Health Rehabiltiation Services Outpatient Rehabilitation Services Vocational Rehabilitation   Arrangements will be made to provide these services after discharge if needed.  Arrangements include referral to agencies that provide these services.  Your insurance has been verified to be:  [employer]  Your primary doctor  is:  Terie Purser  Pertinent information will be shared with your doctor and your insurance company.  Inpatient Rehabilitation Care Coordinator:  Susie Cassette [phone] or ([phone]  Information discussed with and copy given to patient by: Gretchen Short, 07/21/2022, 10:01 AM

## 2022-07-21 NOTE — Progress Notes (Signed)
Patient ID: Jose Lamb, male   DOB: May 06, 1960, 62 y.o.   MRN: [id number]  SW spoke with pt wife to provide updates on ELOS. Reports she has packet. SW informed will provide updates after team conference.   Cecile Sheerer, MSW, LCSWA Office: [phone] Cell: [phone] Fax: [phone]

## 2022-07-21 NOTE — Progress Notes (Signed)
Physical Therapy Session Note  Patient Details  Name: Jose Lamb MRN: [id number] Date of Birth: 12-18-1960  Today's Date: 07/21/2022 PT Individual Time: 4098-1191 and 1550-1630 PT Individual Time Calculation (min): 39 min and 40 min  Short Term Goals: Week 1:  PT Short Term Goal 1 (Week 1): Pt will complete bed mobility consistently with modA. PT Short Term Goal 2 (Week 1): Pt will complete bed to chair transfer consistently with modA. PT Short Term Goal 3 (Week 1): Pt will ambluate x25' with modA and LRAD. PT Short Term Goal 4 (Week 1): Pt will initiate stair training.  Skilled Therapeutic Interventions/Progress Updates:     1st Session: Pt received seated in [facility] and agrees to therapy. No complaint of pain. WC transport to gym for time management. Pt performs stand step transfer from [facility] to mat with modA and facilitation of initiation, anterior weight shift, upright posture, lateral weight shifting, and positioning for safe transition from standing to sitting. PT encourages pt to utilize wall mirror for visual feedback to help maintain midline positioning and neutral posture. Pt  performs sit to stand with [place] and CGA, then sits in high perched position in [place] for increased demand on posture and core muscles to work on trunk control and balance. PT provides external cue for pt to maintain contact with R shoulder on Lt shoulder of PT to maintain midline positioning. Pt tendency is to drift to the Lt, but with occasional verbal cues, he is able to correct posture and return to midline. Pt performs multiple reps of sit to stand with CGA, and performs similar challenge of maintaining neutral posture and positioning in standing. Pt able to stand for bouts of x1 minute at a time, with PT providing cues at Lt glute medius to improve activation.   Stand step transfer from mat>WC>bed with modA and same cues. ModA for sit to supine with PT management of LLE. Pt left supine with alarm intact and all  needs within reach.   2nd Session: Pt received supine in bed and agrees to therapy. No complaint of pain. Supine to sit with modA and cues for hand placement, management of LLE, and positioning at EOB. Pt performs stand step transfer from bed to [facility] with modA and cues for initiation, sequencing, lateral weight shifting, and hand placement to eccentrically control stand to sit for safety. WC transport to gym. Pt performs sit to stand with minA initially but as pt attains upright standing, modA required due to posterior bias. PT provides verbal and tactile cues and pt is able to gradually shift his center of mass anteriorly. Pt ambulates x20' with RW and modA, with tactile cueing at Lt popliteal fossa to prevent hyperextension in loading phase of gait cycle, and verbal cues for progression of LLE during swing phase. Pt fatigues quickly and has increasing Lt lateral lean and decreased ability to advance LLE, requiring +2 to bring [facility] for safety to be able to sit. Extended seated rest break. Pt stands and ambulates additional bout of x10', including 180 degree turn, with modA and similar cues throughout. Pt again fatigues very quickly and has worsening posture and ability to manage LLE with fatigue. Following rest break, pt performs alternating toe taps on 5 inch step with RW and modA, with cues for posture and lateral weight shifting. Pt completes x4 with each lower extremity and then buckles through LLE, requiring maxA to sit safely back in WC. For next bout, pt utilizes 2.5" step and completes x3 toe taps  with LLE and x10 with RLE, with PT providing blocking of Lt knee during loading and WB. WC transport back to room. Pt encouraged to sit up for dinner to work on endurance and core strength. Pt is agreeable. Pt left seated in WC with alarm intact and all needs within reach.   Therapy Documentation Precautions:  Precautions Precautions: Fall Precaution Comments: Lt hemi, poor sitting/standing  balance Restrictions Weight Bearing Restrictions: No   Therapy/Group: Individual Therapy  Beau Fanny, PT, DPT 07/21/2022, 4:48 PM

## 2022-07-21 NOTE — Progress Notes (Signed)
PROGRESS NOTE   Subjective/Complaints:  No events overnight. No acute complaints.  Clarifies pain more in R flank/chest than thigh. Improved with lidocaine patch. Worse with deep inspiration. Otherwise doing well, no complaints.   ROS:  +R chest pain - worse with inspiration - improving Denies fevers, chills, N/V, abdominal pain, constipation, diarrhea, SOB, cough, chest pain, new weakness or paraesthesias.    Objective:   No results found. Recent Labs    07/20/22 0553  WBC 6.0  HGB 13.4  HCT 39.3  PLT 249    Recent Labs    07/20/22 0553  NA 135  K 3.8  CL 103  CO2 22  GLUCOSE 115*  BUN 16  CREATININE 0.85  CALCIUM 9.5     Intake/Output Summary (Last 24 hours) at 07/21/2022 1012 Last data filed at 07/21/2022 0700 Gross per 24 hour  Intake 598 ml  Output 300 ml  Net 298 ml      Pressure Injury 07/13/22 Sacrum Medial Stage 2 -  Partial thickness loss of dermis presenting as a shallow open injury with a red, pink wound bed without slough. Skin Tear Intergluteal Cleft (Active)  07/13/22 1130  Location: Sacrum  Location Orientation: Medial  Staging: Stage 2 -  Partial thickness loss of dermis presenting as a shallow open injury with a red, pink wound bed without slough.  Wound Description (Comments): Skin Tear Intergluteal Cleft  Present on Admission: Yes    Physical Exam: Vital Signs Blood pressure 128/89, pulse 74, temperature 98.1 F (36.7 C), temperature source Oral, resp. rate 16, height 5\' 11"  (1.803 m), weight 92.1 kg, SpO2 96 %.  Constitutional: No apparent distress. Appropriate appearance for age. Sitting in room with therapies.  HENT: No JVD. Neck Supple. Trachea midline. Atraumatic, normocephalic. Eyes: PERRLA. EOMI. Visual fields grossly intact.  Cardiovascular: RRR, no murmurs/rub/gallops. No Edema. Peripheral pulses 2+  Respiratory: CTAB. No rales, rhonchi, or wheezing. On RA.  Abdomen:  + bowel sounds, normoactive. No distention or tenderness.  Skin: C/D/I. No apparent lesions. + [name] IV, c/d/I.  MSK:      No apparent deformity.      Strength: 5/5 BL UE RLE 4+/5,  LLE 3-/5 HF, 3+/5 KE, 4/5 DF, PF.    Neurologic exam:  Cognition: AAO to person, place, time and event.  + Delayed recall + Delayed motor initiation + Mild cognitive deficit  Language: Fluent, No substitutions or neoglisms. Mild dysarthria.  Insight: Good  insight into current condition.  Mood: Flat affect.  Sensation: To light touch intact b/l. CN: 2-12 grossly intact.        Assessment/Plan: 1. Functional deficits which require 3+ hours per day of interdisciplinary therapy in a comprehensive inpatient rehab setting. Physiatrist is providing close team supervision and 24 hour management of active medical problems listed below. Physiatrist and rehab team continue to assess barriers to discharge/monitor patient progress toward functional and medical goals  Care Tool:  Bathing    Body parts bathed by patient: Right arm, Left arm, Chest, Abdomen, Front perineal area, Face   Body parts bathed by helper: Buttocks, Right upper leg, Left upper leg, Right lower leg, Left lower leg  Bathing assist Assist Level: Moderate Assistance - Patient 50 - 74%     Upper Body Dressing/Undressing Upper body dressing   What is the patient wearing?: Pull over shirt    Upper body assist Assist Level: Moderate Assistance - Patient 50 - 74%    Lower Body Dressing/Undressing Lower body dressing      What is the patient wearing?: Pants     Lower body assist Assist for lower body dressing: Dependent - Patient 0%     Toileting Toileting    Toileting assist Assist for toileting: Maximal Assistance - Patient 25 - 49%     Transfers Chair/bed transfer  Transfers assist     Chair/bed transfer assist level: Maximal Assistance - Patient 25 - 49%     Locomotion Ambulation   Ambulation assist       Assist level: 2 helpers Assistive device: Walker-rolling Max distance: 12'   Walk 10 feet activity   Assist     Assist level: 2 helpers Assistive device: Walker-rolling   Walk 50 feet activity   Assist Walk 50 feet with 2 turns activity did not occur: Safety/medical concerns         Walk 150 feet activity   Assist Walk 150 feet activity did not occur: Safety/medical concerns         Walk 10 feet on uneven surface  activity   Assist Walk 10 feet on uneven surfaces activity did not occur: Safety/medical concerns         Wheelchair     Assist Is the patient using a wheelchair?: Yes Type of Wheelchair: Manual    Wheelchair assist level: Minimal Assistance - Patient > 75% Max wheelchair distance: 17'    Wheelchair 50 feet with 2 turns activity    Assist        Assist Level: Minimal Assistance - Patient > 75%   Wheelchair 150 feet activity     Assist      Assist Level: Maximal Assistance - Patient 25 - 49%   Blood pressure 128/89, pulse 74, temperature 98.1 F (36.7 C), temperature source Oral, resp. rate 16, height 5\' 11"  (1.803 m), weight 92.1 kg, SpO2 96 %.  Medical Problem List and Plan: 1. 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 Pt with subsequent left greater than right weakness and sensory loss particularly involving the lower extremities.              -patient may shower             -ELOS/Goals: 12-16 days                  - Continue CIR PT/OT/SLP   - Per family request, HIV diagnosis to remain confidential between wife and patient; do not inform other family  2.  Antithrombotics: -DVT/anticoagulation:  Pharmaceutical: Eliquis             -antiplatelet therapy: none   3. 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 Mood/Behavior/Sleep: LCSW to evaluate and provide emotional support              -antipsychotic agents: n/a   5. Neuropsych/cognition: This patient is capable of making decisions on his own behalf.   - SLP eval   6. Skin/Wound Care: Routine skin care checks   7. Fluids/Electrolytes/Nutrition: Routine Is and Os and follow-up chemistries             -  carb modified diet   - 6/14: Eating 25-100% meals. SLP dysphagia screen d/t mild dysarthria today.  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 fib: continue Eliquis for PE   15: DM-2; A1c = 6.6% on 04/10/2022; (no home meds listed)             -serum glucose range: 89-119             -continue carb modified diet  - No CBGs, monitor BID AC for now   No results for input(s): "GLUCAP" in the last 72 hours.   16: Acute PE and right lower extremity DVT             -continue Eliquis   17: Obesity: BMI 32    18: Chest pain 6/11>>negative trop x 2; EKG non-acute             -attributed to PE versus musculoskeletal   - 6/14: Improved with lidoderm patch. Encourage incentive spirometry.    19. Constipation. LBM 6/11. Schedule Sennakot-S 1 tab QHS and add PRN Miralax daily 17 g.   LOS: 2 days A FACE TO FACE EVALUATION WAS PERFORMED  Angelina Sheriff 07/21/2022, 10:12 AM

## 2022-07-22 DIAGNOSIS — K5900 Constipation, unspecified: Secondary | ICD-10-CM

## 2022-07-22 DIAGNOSIS — R0789 Other chest pain: Secondary | ICD-10-CM

## 2022-07-22 DIAGNOSIS — E119 Type 2 diabetes mellitus without complications: Secondary | ICD-10-CM | POA: Diagnosis not present

## 2022-07-22 DIAGNOSIS — I4891 Unspecified atrial fibrillation: Secondary | ICD-10-CM

## 2022-07-22 DIAGNOSIS — R1310 Dysphagia, unspecified: Secondary | ICD-10-CM

## 2022-07-22 DIAGNOSIS — A812 Progressive multifocal leukoencephalopathy: Secondary | ICD-10-CM | POA: Diagnosis not present

## 2022-07-22 LAB — GLUCOSE, CAPILLARY
Glucose-Capillary: 94 mg/dL (ref 70–99)
Glucose-Capillary: 126 mg/dL — ABNORMAL HIGH (ref 70–99)
Glucose-Capillary: 89 mg/dL (ref 70–99)

## 2022-07-22 NOTE — IPOC Note (Signed)
Overall Plan of Care [facility]) Patient Details Name: Jose Lamb MRN: [id number] DOB: [birth year]  Admitting Diagnosis: PML (progressive multifocal leukoencephalopathy) [facility])  Hospital Problems: Principal Problem:   PML (progressive multifocal leukoencephalopathy) (HCC)     Functional Problem List: Nursing Safety, Sensory, Skin Integrity, Edema, Endurance, Medication Management, Motor, Pain  PT Balance, Endurance, Motor, Pain, Perception, Safety, Sensory, Skin Integrity  OT Balance, Sensory, Cognition, Endurance, Motor, Safety, Perception  SLP Cognition, Nutrition, Safety, Linguistic  TR         Basic ADL's: OT Eating, Grooming, Bathing, Dressing, Toileting     Advanced  ADL's: OT  N/A     Transfers: PT Bed Mobility, Bed to Chair, Furniture, Customer service manager, Research scientist (life sciences): PT Ambulation, Psychologist, prison and probation services, Stairs     Additional Impairments: OT Fuctional Use of Upper Extremity  SLP Swallowing, Communication, Social Cognition expression Problem Solving, Awareness  TR      Anticipated Outcomes Item Anticipated Outcome  Self Feeding Mod I  Swallowing  Mod I   Basic self-care   Groom: Mod I, bathing: Min A, UB dressing: CGA, LB dressing: Min A  Toileting    Min A  Bathroom Transfers  Min A  Bowel/Bladder  continent B/B  Teacher, adult education  Mod I  Cognition  Mod I  Pain  less than 4  Safety/Judgment  no falls   Therapy Plan: PT Intensity: Minimum of 1-2 x/day ,45 to 90 minutes PT Frequency: 5 out of 7 days PT Duration Estimated Length of Stay: 3 weeks OT Intensity: Minimum of 1-2 x/day, 45 to 90 minutes OT Frequency: 5 out of 7 days OT Duration/Estimated Length of Stay: 3 weeks SLP Intensity: Minumum of 1-2 x/day, 30 to 90 minutes SLP Frequency: 1 to 3 out of 7 days SLP Duration/Estimated Length of Stay: 3 weeks   Team Interventions: Nursing Interventions Patient/Family Education,  Medication Management, Psychosocial Support, Skin Care/Wound Management, Disease Management/Prevention, Pain Management, Discharge Planning  PT interventions Ambulation/gait training, Community reintegration, DME/adaptive equipment instruction, Neuromuscular re-education, Psychosocial support, Stair training, UE/LE Strength taining/ROM, Wheelchair propulsion/positioning, Warden/ranger, Discharge planning, Functional electrical stimulation, Pain management, Skin care/wound management, Therapeutic Activities, UE/LE Coordination activities, Cognitive remediation/compensation, Functional mobility training, Splinting/orthotics, Therapeutic Exercise, Visual/perceptual remediation/compensation, Disease management/prevention, Patient/family education  OT Interventions Balance/vestibular training, Disease mangement/prevention, Neuromuscular re-education, Self Care/advanced ADL retraining, Therapeutic Exercise, Wheelchair propulsion/positioning, UE/LE Strength taining/ROM, Pain management, DME/adaptive equipment instruction, Cognitive remediation/compensation, Community reintegration, Functional electrical stimulation, Patient/family education, Splinting/orthotics, UE/LE Coordination activities, Therapeutic Activities, Visual/perceptual remediation/compensation, Psychosocial support, Functional mobility training, Discharge planning  SLP Interventions Cognitive remediation/compensation, Cueing hierarchy, Dysphagia/aspiration precaution training, Environmental controls, Functional tasks, Internal/external aids, Patient/family education, Speech/Language facilitation, Therapeutic Activities  TR Interventions    SW/CM Interventions Discharge Planning, Psychosocial Support, Patient/Family Education   Barriers to Discharge MD  Medical stability  Nursing Decreased caregiver support, Home environment access/layout, Wound Care, Medication compliance lives with spouse with B/B main level 2 ste no rail  PT  Decreased caregiver support, Home environment access/layout    OT Weight    SLP      SW Decreased caregiver support, Lack of/limited family support, Community education officer for SNF coverage, Medication compliance     Team Discharge Planning: Destination: PT-Home ,OT- Home  , SLP-Home Projected Follow-up: PT-Home health PT, 24 hour supervision/assistance, OT-   Home Health OT, 24 hour supervision, SLP-24 hour supervision/assistance, Home Health SLP, Outpatient SLP Projected Equipment Needs: PT-To be determined, OT-  TBD. 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 The team will be addressing functional mobility, strength, stamina, balance, safety, adaptive techniques and equipment, self-care, bowel and bladder mgt, patient and caregiver education. Goals have been set at Sup to min A. Anticipated discharge destination is home.        See Team Conference Notes for weekly updates to the plan of care

## 2022-07-22 NOTE — Progress Notes (Signed)
Occupational Therapy Session Note  Patient Details  Name: Jose Lamb MRN: [id number] Date of Birth: Oct 22, 1960  Today's Date: 07/22/2022 OT Individual Time: 4098-1191 OT Individual Time Calculation (min): 73 min    Short Term Goals: Week 1:  OT Short Term Goal 1 (Week 1): Patient will complete UB dressing at Min A utilizing hemi dressing techniques OT Short Term Goal 2 (Week 1): Pt will complete toilet transfer with Mod A consistantly to right or left utilizing grab bars and elevated toilet seat. OT Short Term Goal 3 (Week 1): Pt will increase functional use of left UE while using it as a stabilizer during self car tasks and self feeding.  Skilled Therapeutic Interventions/Progress Updates:    Pt received sitting in the w/c, finishing up with SLP. He reported fatigue but no pain and was agreeable to OT session. He requested to take a shower. He completed a stand pivot toward the L with mod A +2- very skilled transfer d/t poor midline awareness, L lean, and difficulty motor planning. He completed UB bathing with min A and mod cueing for thoroughness. He required frequent cueing for midline orientation and reducing L lean. He completed LB bathing with mod A. He completed transfer back to the w/c toward the R with heavy mod A, but much improved. Extra time provided for pt to problem solve through postural control. He completed oral care seated in the w/c with again, frequent correction for trunk control. He donned a shirt with (S) seated. Max A to don pants, requiring assist for trunk control when reaching forward and to thread BLE. He stood with heavy emphasis on visual attention to his midline orientation with the mirror anteriorly. He was taken to the therapy gym via w/c. Worked on weightshifting in standing in the parallel bars to address static standing balance, midline orientation, and working up toward LLE clearance for pre-gait. He required mod cueing overall and min A. He returned to his  room and transferred back to bed with mod A. Pt left supine with all needs met, bed alarm set.   Therapy Documentation Precautions:  Precautions Precautions: Fall Precaution Comments: Lt hemi, poor sitting/standing balance Restrictions Weight Bearing Restrictions: No   Therapy/Group: Individual Therapy  Crissie Reese 07/22/2022, 8:00 AM

## 2022-07-22 NOTE — Progress Notes (Signed)
PROGRESS NOTE   Subjective/Complaints:  Reports pain is controlled this AM. He says he is working hard with therapy. Reports BM yesterday- dont see this documented. No additional complaints.   ROS:  +R chest pain - worse with inspiration - improving Denies fevers, chills, N/V, abdominal pain, diarrhea, SOB, cough, chest pain, new weakness or paraesthesias.   Constipation- improved  Objective:   No results found. Recent Labs    07/20/22 0553  WBC 6.0  HGB 13.4  HCT 39.3  PLT 249    Recent Labs    07/20/22 0553  NA 135  K 3.8  CL 103  CO2 22  GLUCOSE 115*  BUN 16  CREATININE 0.85  CALCIUM 9.5     Intake/Output Summary (Last 24 hours) at 07/22/2022 1226 Last data filed at 07/22/2022 0735 Gross per 24 hour  Intake 831 ml  Output 400 ml  Net 431 ml      Pressure Injury 07/13/22 Sacrum Medial Stage 2 -  Partial thickness loss of dermis presenting as a shallow open injury with a red, pink wound bed without slough. Skin Tear Intergluteal Cleft (Active)  07/13/22 1130  Location: Sacrum  Location Orientation: Medial  Staging: Stage 2 -  Partial thickness loss of dermis presenting as a shallow open injury with a red, pink wound bed without slough.  Wound Description (Comments): Skin Tear Intergluteal Cleft  Present on Admission: Yes    Physical Exam: Vital Signs Blood pressure (!) 112/93, pulse 79, temperature 97.8 F (36.6 C), temperature source Oral, resp. rate 18, height 5\' 11"  (1.803 m), weight 92.1 kg, SpO2 98 %.  Constitutional: No apparent distress. Appropriate appearance for age. Sitting in room in WC HENT: [place], AT, MMM Eyes: PERRLA. EOMI. Visual fields grossly intact.  Cardiovascular: RRR, no murmurs/rub/gallops. No Edema. Peripheral pulses 2+  Respiratory: CTAB. No rales, rhonchi, or wheezing. On RA.  Abdomen: + bowel sounds, normoactive. No distention or tenderness.  Skin: C/D/I. No apparent  lesions. + [name] IV, c/d/I.  MSK:      No apparent deformity.      Strength: 5/5 BL UE RLE 4+/5,  LLE 3-/5 HF, 3+/5 KE, 4/5 DF, PF.    Neurologic exam:  Cognition: AAO to person, place, time and event.  + Delayed recall + Delayed motor initiation + Mild cognitive deficit  Language: Fluent, No substitutions or neoglisms. Mild dysarthria.  Insight: Good  insight into current condition.  Mood: Flat affect.  Sensation: To light touch intact b/l. CN: 2-12 grossly intact.        Assessment/Plan: 1. Functional deficits which require 3+ hours per day of interdisciplinary therapy in a comprehensive inpatient rehab setting. Physiatrist is providing close team supervision and 24 hour management of active medical problems listed below. Physiatrist and rehab team continue to assess barriers to discharge/monitor patient progress toward functional and medical goals  Care Tool:  Bathing    Body parts bathed by patient: Right arm, Left arm, Chest, Abdomen, Front perineal area, Face   Body parts bathed by helper: Buttocks, Right upper leg, Left upper leg, Right lower leg, Left lower leg     Bathing assist Assist Level: Moderate Assistance - Patient  50 - 74%     Upper Body Dressing/Undressing Upper body dressing   What is the patient wearing?: Pull over shirt    Upper body assist Assist Level: Moderate Assistance - Patient 50 - 74%    Lower Body Dressing/Undressing Lower body dressing      What is the patient wearing?: Pants     Lower body assist Assist for lower body dressing: Dependent - Patient 0%     Toileting Toileting    Toileting assist Assist for toileting: Maximal Assistance - Patient 25 - 49%     Transfers Chair/bed transfer  Transfers assist  Chair/bed transfer activity did not occur: Safety/medical concerns  Chair/bed transfer assist level: Maximal Assistance - Patient 25 - 49%     Locomotion Ambulation   Ambulation assist      Assist level: 2  helpers Assistive device: Walker-rolling Max distance: 12'   Walk 10 feet activity   Assist     Assist level: 2 helpers Assistive device: Walker-rolling   Walk 50 feet activity   Assist Walk 50 feet with 2 turns activity did not occur: Safety/medical concerns         Walk 150 feet activity   Assist Walk 150 feet activity did not occur: Safety/medical concerns         Walk 10 feet on uneven surface  activity   Assist Walk 10 feet on uneven surfaces activity did not occur: Safety/medical concerns         Wheelchair     Assist Is the patient using a wheelchair?: Yes Type of Wheelchair: Manual    Wheelchair assist level: Minimal Assistance - Patient > 75% Max wheelchair distance: 54'    Wheelchair 50 feet with 2 turns activity    Assist        Assist Level: Minimal Assistance - Patient > 75%   Wheelchair 150 feet activity     Assist      Assist Level: Maximal Assistance - Patient 25 - 49%   Blood pressure (!) 112/93, pulse 79, temperature 97.8 F (36.6 C), temperature source Oral, resp. rate 18, height 5\' 11"  (1.803 m), weight 92.1 kg, SpO2 98 %.  Medical Problem List and Plan: 1. 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 Pt with subsequent left greater than right weakness and sensory loss particularly involving the lower extremities.              -patient may shower             -ELOS/Goals: 12-16 days                  - Continue CIR PT/OT/SLP   - Per family request, HIV diagnosis to remain confidential between wife and patient; do not inform other family  2.  Antithrombotics: -DVT/anticoagulation:  Pharmaceutical: Eliquis             -antiplatelet therapy: none   3. 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 Mood/Behavior/Sleep: LCSW to evaluate and provide emotional support              -antipsychotic agents: n/a   5. Neuropsych/cognition: This patient is capable of making decisions on his own behalf.   - SLP eval   6. Skin/Wound Care: Routine skin care checks   7. Fluids/Electrolytes/Nutrition: Routine Is and Os and follow-up chemistries             -  carb modified diet   - 6/14: Eating 25-100% meals. SLP dysphagia screen d/t mild dysarthria today.  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 fib: continue Eliquis for PE  -HR controlled   15: DM-2; A1c = 6.6% on 04/10/2022; (no home meds listed)             -serum glucose range: 89-119             -continue carb modified diet  - No CBGs, monitor BID AC for now  -CBGs well controlled     Recent Labs    07/21/22 2122 07/22/22 0609 07/22/22 1204  GLUCAP 110* 94 126*     16: Acute PE and right lower extremity DVT             -continue Eliquis   17: Obesity: BMI 32    18: Chest pain 6/11>>negative trop x 2; EKG non-acute             -attributed to PE versus musculoskeletal   - 6/14: Improved with lidoderm patch. Encourage incentive spirometry.  -6/15 reports pain improved/controlled this AM   19. Constipation. LBM 6/11. Schedule Sennakot-S 1 tab QHS and add PRN Miralax daily 17 g.   -6/15 reports improved with BM yesterday  LOS: 3 days A FACE TO FACE EVALUATION WAS PERFORMED  Fanny Dance 07/22/2022, 12:26 PM

## 2022-07-22 NOTE — Progress Notes (Signed)
Physical Therapy Session Note  Patient Details  Name: Jose Lamb MRN: [id number] Date of Birth: February 11, 1960  Today's Date: 07/22/2022 PT Individual Time: 1015-1100 PT Individual Time Calculation (min): 45 min   Short Term Goals: Week 1:  PT Short Term Goal 1 (Week 1): Pt will complete bed mobility consistently with modA. PT Short Term Goal 2 (Week 1): Pt will complete bed to chair transfer consistently with modA. PT Short Term Goal 3 (Week 1): Pt will ambluate x25' with modA and LRAD. PT Short Term Goal 4 (Week 1): Pt will initiate stair training.  Skilled Therapeutic Interventions/Progress Updates:     Pt received supine in bed and agrees to therapy. No complaint of pain. Pt performs supine to sit slowly with bed features and cues for positioning at EOB to ensure both feet are on ground to prepare for transfer. Pt performs stand step transfer from bed to [facility] with modA and cues for initiation, powering up, lateral weight shifting, and positioning. WC transport to gym. Pt completes stand step to Nustep with modA and same cues. Pt completes Nustep for reciprocal coordination training. Pt completes x12:00 at workload of 5 with average steps per minute ~40. PT provides cues for hand and foot placement and completing full available ROM. PT also cues pt to attend to LLE as hip tends to externally rotate. When pt cued to attend to Lt hemibody, he is able to maintain Lt hip in neutral rotation.   Pt transfers back to [facility] with modA and same cues. Pt self propels WC x100' with minA overall due to pt veering to the Lt consistently and requiring assistance to redirect WC. Pt is able to occasionally correct with verbal cues, and responds well to cue to utilize upright gaze to assist with direction. Pt's [name] appears to fatigue and he also has difficulty maintaining consistent grip on hand rim, likely associated with sensation impairment in Lt hemibody.   Pt left seated in WC with alarm intact and all needs  within reach.   Therapy Documentation Precautions:  Precautions Precautions: Fall Precaution Comments: Lt hemi, poor sitting/standing balance Restrictions Weight Bearing Restrictions: No   Therapy/Group: Individual Therapy  Beau Fanny, PT, DPT 07/22/2022, 10:12 AM

## 2022-07-22 NOTE — Progress Notes (Signed)
Speech Language Pathology Daily Session Note  Patient Details  Name: Rylei Saah MRN: [id number] Date of Birth: 07/24/1960  Today's Date: 07/22/2022 SLP Individual Time: 4098-1191 SLP Individual Time Calculation (min): 47 min  Short Term Goals: Week 1: SLP Short Term Goal 1 (Week 1): Patient will consume current diet with supervision level verbal cues for use of swallowing compensatory strategies. SLP Short Term Goal 2 (Week 1): Patient will utilize speech intelligibility strategies at the sentence level to achieve ~90% intelligibility with Min verbal cues. SLP Short Term Goal 3 (Week 1): Patient will demonstrate functional problem solving for mildly complex tasks with Min verbal cues.  Skilled Therapeutic Interventions: Skilled intervention focused on cognition. Pt completed speech intelligibility task with words and conversation. Verbal cues required to increase awareness of decreased intelligibility with medial /t/.Improvement noted with use of overarticulation. Pt provided with list of words to use for practice outside of therapy. Pt read short radio broadcast sample passage with min cues to use over articulation. Speech intelligibilty improved to 80% with use of over articulation of words. Cont with therapy per plan of care.      Pain Pain Assessment Pain Scale: Faces Faces Pain Scale: No hurt  Therapy/Group: Individual Therapy  Carlean Jews [place] Cuevas 07/22/2022, 1:48 PM

## 2022-07-22 NOTE — Progress Notes (Signed)
Physical Therapy Session Note  Patient Details  Name: Jose Lamb MRN: [id number] Date of Birth: [birth year]  Today's Date: 07/22/2022 PT Individual Time: 1116-1200 PT Individual Time Calculation (min): 44 min   Short Term Goals: Week 1:  PT Short Term Goal 1 (Week 1): Pt will complete bed mobility consistently with modA. PT Short Term Goal 2 (Week 1): Pt will complete bed to chair transfer consistently with modA. PT Short Term Goal 3 (Week 1): Pt will ambluate x25' with modA and LRAD. PT Short Term Goal 4 (Week 1): Pt will initiate stair training.  Skilled Therapeutic Interventions/Progress Updates: Pt presents sitting in w/c and agreeable to therapy, although c/o fatigue and continued BM s since last night.  Pt performed sit to stand transfers w/ mod A although L lateral leans unless L LE supported.  Pt brought to main gym for use of // bars.  Pt transfers sit to stand w/ mod A and then performed multiple marches for RLE to increased facilitation/WB to LLE.  L knee buckles w/ fatigue but does not collapse.  Blocking of L knee during marching.  Pt required extended seated rest breaks 2/2 fatigue.  Pt remained sitting in w/c w/ chair alarm on and all needs in reach.     Therapy Documentation Precautions:  Precautions Precautions: Fall Precaution Comments: Lt hemi, poor sitting/standing balance Restrictions Weight Bearing Restrictions: No General:   Vital Signs:  Pain:0/10 Pain Assessment Pain Scale: 0-10 Pain Score: 0-No pain    Therapy/Group: Individual Therapy  Lucio Edward 07/22/2022, 12:22 PM

## 2022-07-23 DIAGNOSIS — E119 Type 2 diabetes mellitus without complications: Secondary | ICD-10-CM | POA: Diagnosis not present

## 2022-07-23 DIAGNOSIS — A812 Progressive multifocal leukoencephalopathy: Secondary | ICD-10-CM | POA: Diagnosis not present

## 2022-07-23 DIAGNOSIS — R0789 Other chest pain: Secondary | ICD-10-CM | POA: Diagnosis not present

## 2022-07-23 DIAGNOSIS — K5900 Constipation, unspecified: Secondary | ICD-10-CM | POA: Diagnosis not present

## 2022-07-23 LAB — GLUCOSE, CAPILLARY
Glucose-Capillary: 89 mg/dL (ref 70–99)
Glucose-Capillary: 111 mg/dL — ABNORMAL HIGH (ref 70–99)

## 2022-07-23 MED ORDER — ENSURE MAX PROTEIN PO LIQD
11.0000 [oz_av] | Freq: Every day | ORAL | Status: DC
  Administered 2022-07-23 – 2022-08-10 (×13): 11 [oz_av] via ORAL

## 2022-07-23 NOTE — Progress Notes (Signed)
Bowel sounds present and patient reports some cramping since miralax given. Scheduled senokot given tonight but patient refused additional laxatives at this time. Education reinforced.   0030 patient requesting suppository at this time and suppository administered. Patient transferred up to [facility] x2 and able to have 2 bowel movements. Patient reported relief with this.

## 2022-07-23 NOTE — Progress Notes (Signed)
No documented BM since 6/10. Pt refused dulcolax suppository, but agreeable to PRN miralax and administered. Awaiting results.   Marylu Lund, RN

## 2022-07-23 NOTE — Progress Notes (Addendum)
PROGRESS NOTE   Subjective/Complaints:  Today reports he slept well last night.  Denies pain.  Dietitian have talked to him about a nutritional supplement, he would like to try this preferably in strawberry.  ROS:  +R chest pain - worse with inspiration - improving Denies fevers, chills, N/V, abdominal pain, diarrhea, SOB, cough, chest pain, new weakness or paraesthesias.   Constipation- improved  Objective:   No results found. No results for input(s): "WBC", "HGB", "HCT", "PLT" in the last 72 hours.  No results for input(s): "NA", "K", "CL", "CO2", "GLUCOSE", "BUN", "CREATININE", "CALCIUM" in the last 72 hours.   Intake/Output Summary (Last 24 hours) at 07/23/2022 1057 Last data filed at 07/23/2022 0741 Gross per 24 hour  Intake 751 ml  Output 700 ml  Net 51 ml      Pressure Injury 07/13/22 Sacrum Medial Stage 2 -  Partial thickness loss of dermis presenting as a shallow open injury with a red, pink wound bed without slough. Skin Tear Intergluteal Cleft (Active)  07/13/22 1130  Location: Sacrum  Location Orientation: Medial  Staging: Stage 2 -  Partial thickness loss of dermis presenting as a shallow open injury with a red, pink wound bed without slough.  Wound Description (Comments): Skin Tear Intergluteal Cleft  Present on Admission: Yes    Physical Exam: Vital Signs Blood pressure (!) 119/91, pulse 67, temperature 97.7 F (36.5 C), temperature source Oral, resp. rate 18, height 5\' 11"  (1.803 m), weight 92.1 kg, SpO2 97 %.  Constitutional: No apparent distress. Appropriate appearance for age. Sitting in room in WC HENT: [place], AT, MMM Eyes: PERRLA. EOMI. Visual fields grossly intact.  Cardiovascular: RRR, no murmurs/rub/gallops. No Edema. Peripheral pulses 2+  Respiratory: CTAB. No rales, rhonchi, or wheezing. On RA.  Abdomen: + bowel sounds, normoactive. No distention or tenderness.  Skin: C/D/I. No apparent  lesions. + [name] IV, c/d/I.  MSK:      No apparent deformity.      Strength: 5/5 BL UE RLE 4+/5,  LLE 3-/5 HF, 3+/5 KE, 4/5 DF, PF.    Neurologic exam:  Cognition: AAO to person, place, time and event.  + Delayed recall + Delayed motor initiation + Mild cognitive deficit  Language: Fluent, No substitutions or neoglisms. Mild dysarthria.  Insight: Good  insight into current condition.  Mood: Flat affect.  Sensation: To light touch intact b/l. CN: 2-12 grossly intact.        Assessment/Plan: 1. Functional deficits which require 3+ hours per day of interdisciplinary therapy in a comprehensive inpatient rehab setting. Physiatrist is providing close team supervision and 24 hour management of active medical problems listed below. Physiatrist and rehab team continue to assess barriers to discharge/monitor patient progress toward functional and medical goals  Care Tool:  Bathing    Body parts bathed by patient: Right arm, Left arm, Chest, Abdomen, Front perineal area, Face   Body parts bathed by helper: Buttocks, Right upper leg, Left upper leg, Right lower leg, Left lower leg     Bathing assist Assist Level: Moderate Assistance - Patient 50 - 74%     Upper Body Dressing/Undressing Upper body dressing   What is the patient wearing?:  Pull over shirt    Upper body assist Assist Level: Moderate Assistance - Patient 50 - 74%    Lower Body Dressing/Undressing Lower body dressing      What is the patient wearing?: Pants     Lower body assist Assist for lower body dressing: Dependent - Patient 0%     Toileting Toileting    Toileting assist Assist for toileting: Maximal Assistance - Patient 25 - 49%     Transfers Chair/bed transfer  Transfers assist  Chair/bed transfer activity did not occur: Safety/medical concerns  Chair/bed transfer assist level: Maximal Assistance - Patient 25 - 49%     Locomotion Ambulation   Ambulation assist      Assist level: 2  helpers Assistive device: Walker-rolling Max distance: 12'   Walk 10 feet activity   Assist     Assist level: 2 helpers Assistive device: Walker-rolling   Walk 50 feet activity   Assist Walk 50 feet with 2 turns activity did not occur: Safety/medical concerns         Walk 150 feet activity   Assist Walk 150 feet activity did not occur: Safety/medical concerns         Walk 10 feet on uneven surface  activity   Assist Walk 10 feet on uneven surfaces activity did not occur: Safety/medical concerns         Wheelchair     Assist Is the patient using a wheelchair?: Yes Type of Wheelchair: Manual    Wheelchair assist level: Minimal Assistance - Patient > 75% Max wheelchair distance: 20'    Wheelchair 50 feet with 2 turns activity    Assist        Assist Level: Minimal Assistance - Patient > 75%   Wheelchair 150 feet activity     Assist      Assist Level: Maximal Assistance - Patient 25 - 49%   Blood pressure (!) 119/91, pulse 67, temperature 97.7 F (36.5 C), temperature source Oral, resp. rate 18, height 5\' 11"  (1.803 m), weight 92.1 kg, SpO2 97 %.  Medical Problem List and Plan: 1. 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 Pt with subsequent left greater than right weakness and sensory loss particularly involving the lower extremities.              -patient may shower             -ELOS/Goals: 12-16 days                  - Continue CIR PT/OT/SLP   - Per family request, HIV diagnosis to remain confidential between wife and patient; do not inform other family  2.  Antithrombotics: -DVT/anticoagulation:  Pharmaceutical: Eliquis             -antiplatelet therapy: none   3. 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 Mood/Behavior/Sleep: LCSW to evaluate and provide emotional support              -antipsychotic agents: n/a   5. Neuropsych/cognition: This patient is capable of making decisions on his own behalf.   - SLP eval   6. Skin/Wound Care: Routine skin care checks   7. Fluids/Electrolytes/Nutrition: Routine Is and Os and follow-up chemistries             -carb modified diet   - 6/14: Eating 25-100% meals. SLP dysphagia screen d/t  mild dysarthria today.  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 fib: continue Eliquis for PE  -HR controlled   15: DM-2; A1c = 6.6% on 04/10/2022; (no home meds listed)             -serum glucose range: 89-119             -continue carb modified diet  - No CBGs, monitor BID AC for now  -6/16 very well-controlled    Recent Labs    07/22/22 1204 07/22/22 1712 07/23/22 0556  GLUCAP 126* 89 89      16: Acute PE and right lower extremity DVT             -continue Eliquis   17: Obesity: BMI 32    18: Chest pain 6/11>>negative trop x 2; EKG non-acute             -attributed to PE versus musculoskeletal   - 6/14: Improved with lidoderm patch. Encourage incentive spirometry.  -6/16 reports this is improved today.   19. Constipation. LBM 6/11. Schedule Sennakot-S 1 tab QHS and add PRN Miralax daily 17 g.   -6/15 reports improved with BM yesterday   -6/16 denies feeling constipated  LOS: 4 days A FACE TO FACE EVALUATION WAS PERFORMED  Fanny Dance 07/23/2022, 10:57 AM

## 2022-07-24 DIAGNOSIS — A812 Progressive multifocal leukoencephalopathy: Secondary | ICD-10-CM | POA: Diagnosis not present

## 2022-07-24 LAB — BASIC METABOLIC PANEL
Anion gap: 13 (ref 5–15)
BUN: 15 mg/dL (ref 6–20)
CO2: 23 mmol/L (ref 22–32)
Calcium: 9.6 mg/dL (ref 8.9–10.3)
Chloride: 100 mmol/L (ref 98–111)
Creatinine, Ser: 0.93 mg/dL (ref 0.61–1.24)
GFR, Estimated: >60 mL/min (ref >60)
Glucose, Bld: 103 mg/dL — ABNORMAL HIGH (ref 70–99)
Potassium: 4.1 mmol/L (ref 3.5–5.1)
Sodium: 136 mmol/L (ref 135–145)

## 2022-07-24 LAB — CBC
HCT: 41.9 % (ref 39.0–52.0)
Hemoglobin: 14 g/dL (ref 13.0–17.0)
MCH: 28.9 pg (ref 26.0–34.0)
MCHC: 33.4 g/dL (ref 30.0–36.0)
MCV: 86.4 fL (ref 80.0–100.0)
Platelets: 338 10*3/uL (ref 150–400)
RBC: 4.85 MIL/uL (ref 4.22–5.81)
RDW: 13.3 % (ref 11.5–15.5)
WBC: 5.8 10*3/uL (ref 4.0–10.5)
nRBC: 0 % (ref 0.0–0.2)

## 2022-07-24 LAB — GLUCOSE, CAPILLARY
Glucose-Capillary: 92 mg/dL (ref 70–99)
Glucose-Capillary: 125 mg/dL — ABNORMAL HIGH (ref 70–99)

## 2022-07-24 NOTE — Progress Notes (Signed)
Speech Language Pathology Daily Session Note  Patient Details  Name: Jose Lamb MRN: [id number] Date of Birth: [birth year]  Today's Date: 07/24/2022 SLP Individual Time: 0105-0147 SLP Individual Time Calculation (min): 42 min  Short Term Goals: Week 1: SLP Short Term Goal 1 (Week 1): Patient will consume current diet with supervision level verbal cues for use of swallowing compensatory strategies. SLP Short Term Goal 2 (Week 1): Patient will utilize speech intelligibility strategies at the sentence level to achieve ~90% intelligibility with Min verbal cues. SLP Short Term Goal 3 (Week 1): Patient will demonstrate functional problem solving for mildly complex tasks with Min verbal cues.  Skilled Therapeutic Interventions:  Pt was seen in PM to address speech intelligibility and problem solving. Pt was alert and seated upright in WC upon SLP arrival. Pt with declining position in chair and appeared uncomfortable. Pt requested transfer to bed. Assigned nurse present and assisting this SLP to transfer pt to bed using [place]. Once in the bed SLP reviewed speech intelligibility strategies. Pt challenged in use during functional communication. Pt required min to mod A for use of strategies with ~90% intelligibility in sentences. SLP addressed problem solving in additional minutes of session through challenging pt to identify solutions to problems within least restrictive environment. Pt completed task with 83% acc with min cues. In missed opportunities, SLP provided correct response and rationale. Pt was left at bedside with call button within reach and bed alarm active. SLP to continue POC.  Pain Pain Assessment Pain Scale: 0-10 Pain Score: 0-No pain  Therapy/Group: Individual Therapy  Renaee Munda 07/24/2022, 4:51 PM

## 2022-07-24 NOTE — Progress Notes (Signed)
Occupational Therapy Session Note  Patient Details  Name: Renae Korber MRN: [id number] Date of Birth: 11-30-1960  Session 1 Today's Date: 07/24/2022 OT Individual Time: 1100-1200 OT Individual Time Calculation (min): 60 min     Session 2 Today's Date: 07/24/2022 OT Individual Time: 1400-1500 OT Individual Time Calculation (min): 60 min    Short Term Goals: Week 1:  OT Short Term Goal 1 (Week 1): Patient will complete UB dressing at Min A utilizing hemi dressing techniques OT Short Term Goal 2 (Week 1): Pt will complete toilet transfer with Mod A consistantly to right or left utilizing grab bars and elevated toilet seat. OT Short Term Goal 3 (Week 1): Pt will increase functional use of left UE while using it as a stabilizer during self car tasks and self feeding.  Skilled Therapeutic Interventions/Progress Updates:    Session 1 Pt received supine with no c/o pain, agreeable to OT session. He completed bed mobility to the EOB with min A for trunk control, mod cueing for L attention. Squat pivot to the w/c toward the L with facilitation for [name] placement and mod A overall. Pt was taken via w/c to the therapy gym for time management. Session focused on trunk/postural control, midline orientation/awareness, [name]/LLE activation/proprioception within the context of functional mobility. Utilized the Litegait harness support over ground with moderate traction from harness. Pt with a lot of difficulty motor planning and with poor posterior chain/weight shifting activation in the litegait- often sitting back into harness and with greatly reduced weightshift- requiring total A for LLE advancement. Switched to 3 muskateers technique to allow greater control from therapist for weightshifting and to provide graded cueing and this was more successful. He required mod A +2 but was able to advance the LLE 90% of the time. Frequent cueing for midline awareness, glute activation, upright posture, and motor  planning R/L advancement. He required intermittent rest breaks seated. He returned to his room and was left sitting up in the w/c. Chair alarm belt set, all needs met.    Session 2 Pt received supine with no c/o pain, agreeable to OT session. He came to EOB with extra time and mod cueing for motor planning and L attention. Stand pivot with mod A toward the L. Pt was taken via w/c to the therapy gym for time management. Stand pivot to the mat with mod A. Extensor tone present in the LLE/[name]. Worked on trunk control EOM with lateral leans, forward trunk lean and functional reaching. Guarding required anterior and posterior with heavy trunk perturbations posterior and L. Pt obviously very fatigued and requiring more assist overall this session. He completed bimanual integration task, passing items between R and L UE and requiring mod-max vc to maintain attention to the [name]. He returned to his w/c and to his room. Stand pivot back to bed with mod A. He was able to reposition himself with min A. He was left supine with all needs met. Bed alarm set.    Therapy Documentation Precautions:  Precautions Precautions: Fall Precaution Comments: Lt hemi, poor sitting/standing balance Restrictions Weight Bearing Restrictions: No   Therapy/Group: Individual Therapy  Crissie Reese 07/24/2022, 6:15 AM

## 2022-07-24 NOTE — Progress Notes (Signed)
Physical Therapy Session Note  Patient Details  Name: Jose Lamb MRN: [id number] Date of Birth: 08/08/1960  Today's Date: 07/24/2022 PT Individual Time: 0915-1000 PT Individual Time Calculation (min): 45 min   Short Term Goals: Week 1:  PT Short Term Goal 1 (Week 1): Pt will complete bed mobility consistently with modA. PT Short Term Goal 2 (Week 1): Pt will complete bed to chair transfer consistently with modA. PT Short Term Goal 3 (Week 1): Pt will ambluate x25' with modA and LRAD. PT Short Term Goal 4 (Week 1): Pt will initiate stair training.  Skilled Therapeutic Interventions/Progress Updates:  Patient greeted supine in bed with NT present and agreeable to PT treatment session. Patient transitioned from supine to sitting EOB with use of Rt bed rail, supv and increased time required to complete. VC throughout for improved attention to Lt LE. While seated EOB, patient doffed gown and donned scrub top with Min/ModA- Patient required VC for increased anterior weight shift in order to improve overall sitting balance. Therapist threaded pants for time management and energy conservation. Patient stood from EOB with ModA and remained standing while therapist pulled pants over hips- VC for increased anterior weight shift for improved midline posturing and standing balance. Patient then performed stand pivot transfer to wheelchair with ModA and multimodal cues for improved Lt LE placement. Patient wheeled to/from his room and ortho gym for time management and energy conservation.   Patient stood x3-5 minutes unsupported with Min/ModA for stability- Multimodal cues and facilitation for improved postural extension and midline posturing. Mirror placed in front of patient for external visual cues regarding posturing with good improvements noted.   Patient performed various sit/stands throughout treatment session without the use of an AD and Min/ModA for initiating standing- At times, patient  demonstrates significant posterior lean leading to posterior LOB. MAX multimodal cues for improved anterior weight shift for improved stability and ease with functional mobility tasks.   Patient stood unsupported with Min/ModA from therapist for improved stability with multimodal cues for improved postural extension, midline posturing and improved anterior weight shift. While standing, patient was tasked with reaching for various squigz with his Lt hand on the mirror and then placing them in the container on his L side. Completed x15 total with good effort throughout activity. Therapist intermittently blocking Lt knee for improved stability in standing.   Patient returned to his room and left sitting upright in wheelchair in room with posey belt on, call bell within reach and all needs met.    Therapy Documentation Precautions:  Precautions Precautions: Fall Precaution Comments: Lt hemi, poor sitting/standing balance Restrictions Weight Bearing Restrictions: No  Pain: No/Denies pain.    Therapy/Group: Individual Therapy  Gabriela Giannelli 07/24/2022, 7:57 AM

## 2022-07-24 NOTE — Consult Note (Signed)
Neuropsychological Consultation [facility]   Jose Lamb:   Jose Lamb   DOB:   1960/03/17  MR Number:  [id number]  Location:  [facility] [address] [facility] [address] [address] CENTER [address] [id number] [place] [address] Dept: [phone] Loc: [phone]           Date of Service:   07/24/2022  Start Time:   10 AM End Time:   11 AM  Provider/Observer:  Jose Lamb, Psy.D.       Clinical Neuropsychologist       Billing Code/Service: [phone]  Reason for Service:    Jose Lamb is a 62 year old male referred for neuropsychological consultation due to coping and adjustment issues and cognitive issues.  Jose Lamb had presented initially to [facility] on 07/05/2022 reporting history of multiple falls and a recent stroke.  Jose Lamb complained of overall soreness and weakness.  Only issues noted were significant possible urinary tract infection and hypokalemia.  Jose Lamb was placed on antibiotics and given potassium supplements and discharged home.  Jose Lamb Jose Lamb presented to emergency department at [facility] on 07/07/2022 continuing to complain of a 6-week history of left-sided illness.  Jose Lamb sent for MRI and neurology consulted.  MRI revealed abnormal T2 signal of the cortical/subcortical and right greater than left insular postcentral gyrus as well as splenium and body of corpus callosum and thalamus and other midbrain regions.  There was concern for infectious versus inflammatory process such as PML, autoimmune encephalitis, herpes simplex and enteritis.  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  Jose Lamb has past medical history of HIV infection, hypertension and diabetes.  His HIV disease is longstanding and he has not been on antiretroviral therapies for several years (2017).  Infectious disease was consulted with concern for PML based on MRI and history of  nonadherence to his medication regimen.  CSF was positive for herpes simplex virus test to and he was started on Aquaphor.  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  Jose Lamb had therapy evaluations completed and was admitted onto CIR for therapies due to right-sided weakness, sensory loss etc.  During today's clinical visit the Jose Lamb was generally oriented and in a positive mood state.  He was readily aware and admitted to not taking his medications for quite some time and we discussed barriers to him maintaining medication compliance.  Jose Lamb reports that he is motivated to take these.  While the Jose Lamb clearly had some cognitive issues most of these were noted having to do with patterns consistent with midbrain and subcortical dysfunction and dysregulation.  Jose Lamb denied significant depression or anxiety and denied significant coping difficulties with his current hospitalization although he is anxious about how much she will be able to improve even with therapies.  The Jose Lamb has shown improvement overall with therapies but continues with significant deficits.  HPI for the current admission:    HPI: Jose Lamb is a 62 year old male presented to [facility] ED on 07/05/2022 reporting a history of multiple falls and a recent stroke.  Chief complaints were allover soreness and weakness.  Workup was significant for possible urinary tract infection and hypokalemia.  He was placed on antibiotics and given potassium supplement and discharged home.  He re-presented to the emergency department at [facility] on 07/07/2022 complaining of an approximately 6-week history of left-sided weakness.  Notes indicate the Jose Lamb was sent there for MRI scanning.  Neurology was consulted and MRI of the brain revealed abnormal T2 signal of the cortical subcortical and right greater than left insular postcentral gyrus as well as splenium and  body of corpus callosum and thalamus and midbrain.  This was concerning for infectious versus inflammatory process such as PML, autoimmune encephalitis, herpes simplex and enteritis.  Recommendations were for high-volume LP with labs, MRI of the brain with contrast and admission to [facility].  The Jose Lamb's pertinent past medical history includes HIV, hypertension and diabetes mellitus.  The Jose Lamb's history of HIV disease is longstanding and he has not been on ART for several years going back to 2017.  His most recent regimen was Descovy, darnunavir/ritonavir and etravirine.  Prior history of bilateral pulmonary embolism 2021, atrial fibrillation in the setting of hyperthyroidism.  The Jose Lamb is not chronically anticoagulated.   Infectious disease consultation obtained by Dr. Earlene Plater on 6/01.  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  Given high-dose B12 daily. Imaging was also significant for bilateral parotid masses and ENT consultation placed for biopsy.  Ultrasound-guided aspiration of bilateral parotid cyst on 6/03 by Dr. Grace Isaac.  Negative for malignancy. He was admitted to [facility] on 6/07 and developed fever and tachypnea with tachycardia at approximately 8 pm. He was transferred back to acute bed and found to have extensive bilateral pulmonary emboli with moderate-to-large thrombus volume and evidence of right heart strain. Placed on Eliquis. Tolerating heart healthy diet. 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 Pt with subsequent left greater than right weakness and sensory loss particularly involving the  lower extremities. Has cough.  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   Parotid gland enlargement 07/10/2022   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   AIDS (acquired immune deficiency syndrome) (HCC) 07/10/2022   HIV disease (HCC) 07/08/2022   Weakness 07/08/2022   Left-sided weakness 07/07/2022   Acute metabolic encephalopathy 04/06/2022   Sepsis due to pneumonia (HCC) 04/06/2022   Umbilical hernia without obstruction and without gangrene 12/16/2021   SBO (small bowel obstruction) (HCC) 12/14/2021   History of pulmonary embolus (PE) 12/14/2021   Hypokalemia 12/14/2021   CAP (community acquired pneumonia) 12/14/2021   Diabetes mellitus type 2 in obese 12/14/2021   GERD (gastroesophageal reflux disease) 12/14/2021   Bilateral pulmonary embolism (HCC) 09/19/2019   Acute respiratory failure with hypoxia (HCC)    Acute respiratory disease due to COVID-19 virus 09/15/2019   Pneumonia due to COVID-19 virus 09/15/2019  Ectatic aorta (HCC) 04/17/2018   Pre-diabetes 11/26/2014   Asthma 11/05/2014   Paroxysmal atrial fibrillation (HCC) 06/30/2014   Graves' disease 04/01/2014   Constipation 03/24/2014   Hypertension 02/06/2014   Moderate malnutrition (HCC) 02/06/2014   Peptic ulcer with perforation (HCC)    Duodenal ulcer perforation (HCC) 02/04/2014   HIV (human immunodeficiency virus infection) (HCC) 02/04/2014   Perforated  duodenal ulcer (HCC) 02/04/2014   Neuropathic pain 03/13/2013   Class 1 obesity 07/04/2012   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 his dress was Appropriate and he was Well Groomed and his manners were Appropriate to the situation.  his participation was indicative of Appropriate and Redirectable behaviors.  There were physical disabilities noted.  he displayed an appropriate level of cooperation and motivation.    Interactions:    Active Appropriate  Attention:   abnormal and attention span appeared shorter than expected for age  Memory:   within normal limits; recent and remote memory intact  Visuo-spatial:   not examined  Speech (Volume):  normal  Speech:   normal; normal  Thought Process:  Coherent and Relevant  Coherent and Directed  Though Content:  WNL; not suicidal and not homicidal  Orientation:   person, place, time/date, and situation  Judgment:   Fair  Planning:   Fair  Affect:    Appropriate  Mood:    Euthymic  Insight:   Fair  Intelligence:   normal  Psychiatric History:  No prior psychiatric history noted  Family Med/Psych History:  Family History  Problem Relation Age of Onset   Ulcers Mother    Heart Problems Mother    Colon cancer Neg Hx     Impression/DX:   Lister Capelle is a 62 year old male referred for neuropsychological consultation due to coping and adjustment issues and cognitive issues.  Jose Lamb had presented initially to [facility] on 07/05/2022 reporting history of multiple falls and a recent stroke.  Jose Lamb complained of overall soreness and weakness.  Only issues noted were significant possible urinary tract infection and hypokalemia.  Jose Lamb was placed on antibiotics and given potassium supplements and discharged home.  Jose Lamb Jose Lamb presented to emergency department at [facility]  on 07/07/2022 continuing to complain of a 6-week history of left-sided illness.  Jose Lamb sent for MRI and neurology consulted.  MRI revealed abnormal T2 signal of the cortical/subcortical and right greater than left insular postcentral gyrus as well as splenium and body of corpus callosum and thalamus and other midbrain regions.  There was concern for infectious versus inflammatory process such as PML, autoimmune encephalitis, herpes simplex and enteritis.  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  Jose Lamb has past medical history of HIV infection, hypertension and diabetes.  His HIV disease is longstanding and he has not been on antiretroviral therapies for several years (2017).  Infectious disease was consulted with concern for PML based on MRI and history of nonadherence to his medication regimen.  CSF was positive for herpes simplex virus test to and he was started on Aquaphor.  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  Jose Lamb had therapy evaluations completed and was admitted onto CIR for therapies due to right-sided weakness, sensory loss etc.  During today's clinical visit the Jose Lamb was generally  oriented and in a positive mood state.  He was readily aware and admitted to not taking his medications for quite some time and we discussed barriers to him maintaining medication compliance.  Jose Lamb reports that he is motivated to take these.  While the Jose Lamb clearly had some cognitive issues most of these were noted having to do with patterns consistent with midbrain and subcortical dysfunction and dysregulation.  Jose Lamb denied significant depression or anxiety and denied significant coping difficulties with his current hospitalization although he is anxious about how much she will be able to improve even with therapies.  The Jose Lamb has shown improvement overall with therapies but  continues with significant deficits.   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         Electronically Signed   _______________________ Jose Lamb, Psy.D. Clinical Neuropsychologist

## 2022-07-24 NOTE — Progress Notes (Signed)
PROGRESS NOTE   Subjective/Complaints:  Overnight, had BM x2 with suppository. States he feels much better now. Vitals stable Labs unchanged No  complaints. No events overnight.   ROS:  Denies fevers, chills, N/V, abdominal pain, diarrhea, SOB, cough, chest pain, new weakness or paraesthesias.   Constipation- improved  Objective:   No results found. Recent Labs    07/24/22 0619  WBC 5.8  HGB 14.0  HCT 41.9  PLT 338    Recent Labs    07/24/22 0619  NA 136  K 4.1  CL 100  CO2 23  GLUCOSE 103*  BUN 15  CREATININE 0.93  CALCIUM 9.6     Intake/Output Summary (Last 24 hours) at 07/24/2022 1610 Last data filed at 07/24/2022 0110 Gross per 24 hour  Intake 550 ml  Output 850 ml  Net -300 ml      Pressure Injury 07/13/22 Sacrum Medial Stage 2 -  Partial thickness loss of dermis presenting as a shallow open injury with a red, pink wound bed without slough. Skin Tear Intergluteal Cleft (Active)  07/13/22 1130  Location: Sacrum  Location Orientation: Medial  Staging: Stage 2 -  Partial thickness loss of dermis presenting as a shallow open injury with a red, pink wound bed without slough.  Wound Description (Comments): Skin Tear Intergluteal Cleft  Present on Admission: Yes    Physical Exam: Vital Signs Blood pressure 128/67, pulse (!) 55, temperature 98.2 F (36.8 C), temperature source Oral, resp. rate 18, height 5\' 11"  (1.803 m), weight 92.1 kg, SpO2 97 %.  Constitutional: No apparent distress. Appropriate appearance for age. Laying in bed.  HENT: [place], AT, MMM Eyes: PERRLA. EOMI. Visual fields grossly intact.  Cardiovascular: RRR, no murmurs/rub/gallops. No Edema. Peripheral pulses 2+  Respiratory: CTAB. No rales, rhonchi, or wheezing. On RA.  Abdomen: + bowel sounds, normoactive. No distention or tenderness.  Skin: C/D/I. No apparent lesions. + [name] IV, c/d/I.  MSK:      No apparent deformity.       Strength: 5/5 BL UE RLE 4+/5,  LLE 3-/5 HF, 4-/5 KE, 4+/5 DF, PF - improving!  Neurologic exam:  Cognition: AAO to person, place, time and event.  + Delayed recall + Delayed motor initiation + Mild cognitive deficit  Language: Fluent, No substitutions or neoglisms. Mild dysarthria.  Insight: Good  insight into current condition.  Mood: Flat affect.  Sensation: To light touch intact b/l. CN: 2-12 grossly intact.        Assessment/Plan: 1. Functional deficits which require 3+ hours per day of interdisciplinary therapy in a comprehensive inpatient rehab setting. Physiatrist is providing close team supervision and 24 hour management of active medical problems listed below. Physiatrist and rehab team continue to assess barriers to discharge/monitor patient progress toward functional and medical goals  Care Tool:  Bathing    Body parts bathed by patient: Right arm, Left arm, Chest, Abdomen, Front perineal area, Face   Body parts bathed by helper: Buttocks, Right upper leg, Left upper leg, Right lower leg, Left lower leg     Bathing assist Assist Level: Moderate Assistance - Patient 50 - 74%     Upper Body Dressing/Undressing Upper body  dressing   What is the patient wearing?: Pull over shirt    Upper body assist Assist Level: Moderate Assistance - Patient 50 - 74%    Lower Body Dressing/Undressing Lower body dressing      What is the patient wearing?: Pants     Lower body assist Assist for lower body dressing: Dependent - Patient 0%     Toileting Toileting    Toileting assist Assist for toileting: Maximal Assistance - Patient 25 - 49%     Transfers Chair/bed transfer  Transfers assist  Chair/bed transfer activity did not occur: Safety/medical concerns  Chair/bed transfer assist level: Maximal Assistance - Patient 25 - 49%     Locomotion Ambulation   Ambulation assist      Assist level: 2 helpers Assistive device: Walker-rolling Max distance:  12'   Walk 10 feet activity   Assist     Assist level: 2 helpers Assistive device: Walker-rolling   Walk 50 feet activity   Assist Walk 50 feet with 2 turns activity did not occur: Safety/medical concerns         Walk 150 feet activity   Assist Walk 150 feet activity did not occur: Safety/medical concerns         Walk 10 feet on uneven surface  activity   Assist Walk 10 feet on uneven surfaces activity did not occur: Safety/medical concerns         Wheelchair     Assist Is the patient using a wheelchair?: Yes Type of Wheelchair: Manual    Wheelchair assist level: Minimal Assistance - Patient > 75% Max wheelchair distance: 42'    Wheelchair 50 feet with 2 turns activity    Assist        Assist Level: Minimal Assistance - Patient > 75%   Wheelchair 150 feet activity     Assist      Assist Level: Maximal Assistance - Patient 25 - 49%   Blood pressure 128/67, pulse (!) 55, temperature 98.2 F (36.8 C), temperature source Oral, resp. rate 18, height 5\' 11"  (1.803 m), weight 92.1 kg, SpO2 97 %.  Medical Problem List and Plan: 1. 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 Pt with subsequent left greater than right weakness and sensory loss particularly involving the lower extremities.              -patient may shower             -ELOS/Goals: 12-16 days                  - Continue CIR PT/OT/SLP   - Per family request, HIV diagnosis to remain confidential between wife and patient; do not inform other family  2.  Antithrombotics: -DVT/anticoagulation:  Pharmaceutical: Eliquis             -antiplatelet therapy: none   3. 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 Mood/Behavior/Sleep: LCSW to evaluate and provide emotional support             -antipsychotic agents: n/a   5. Neuropsych/cognition: This  patient is capable of making decisions on his own behalf.   - SLP eval   6. Skin/Wound Care: Routine skin care checks   7. Fluids/Electrolytes/Nutrition: Routine Is and Os and follow-up chemistries             -carb modified diet   -  6/14: Eating 25-100% meals. SLP dysphagia screen d/t mild dysarthria today.  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 fib: continue Eliquis for PE  -HR controlled   15: DM-2; A1c = 6.6% on 04/10/2022; (no home meds listed)             -serum glucose range: 89-119             -continue carb modified diet  - No CBGs, monitor BID AC for now  -6/16-17 very well-controlled; can consider DC if remain low with adequate PO    Recent Labs    07/23/22 0556 07/23/22 1705 07/24/22 0614  GLUCAP 89 111* 92      16: Acute PE and right lower extremity DVT             -continue Eliquis -> 10 to 5 mg BID dosing 6/17   17: Obesity: BMI 32    18: Chest pain 6/11>>negative trop x 2; EKG non-acute             -attributed to PE versus musculoskeletal   - 6/14: Improved with lidoderm patch. Encourage incentive spirometry.  -6/16 reports this is improved today.   19. Constipation. LBM 6/11. Schedule Sennakot-S 1 tab QHS and add PRN Miralax daily 17 g.   -6/15 reports improved with BM yesterday   -6/16 denies feeling constipated   - LBM 6/17  LOS: 5 days A FACE TO FACE EVALUATION WAS PERFORMED  Angelina Sheriff 07/24/2022, 8:07 AM

## 2022-07-25 DIAGNOSIS — A812 Progressive multifocal leukoencephalopathy: Secondary | ICD-10-CM | POA: Diagnosis not present

## 2022-07-25 LAB — GLUCOSE, CAPILLARY
Glucose-Capillary: 100 mg/dL — ABNORMAL HIGH (ref 70–99)
Glucose-Capillary: 107 mg/dL — ABNORMAL HIGH (ref 70–99)

## 2022-07-25 MED ORDER — BACLOFEN 5 MG HALF TABLET
5.0000 mg | ORAL_TABLET | Freq: Three times a day (TID) | ORAL | Status: DC
  Administered 2022-07-25 – 2022-07-26 (×3): 5 mg via ORAL
  Filled 2022-07-25 (×3): qty 1

## 2022-07-25 NOTE — Patient Care Conference (Signed)
Inpatient RehabilitationTeam Conference and Plan of Care Update Date: 07/25/2022   Time: 10:06 AM    Patient Name: Jose Lamb      Medical Record Number: [id number]  Date of Birth: [birth year] Sex: Male         Room/Bed: 4M11C/4M11C-01 Payor Info: Payor: [employer]UE [employer] / Plan: BCBS COMM PPO / Product Type: *No Product type* /    Admit Date/Time:  07/19/2022  3:14 PM  Primary Diagnosis:  PML (progressive multifocal leukoencephalopathy) [facility])  Hospital Problems: Principal Problem:   PML (progressive multifocal leukoencephalopathy) [facility])    Expected Discharge Date: Expected Discharge Date: 08/09/22  Team Members Present: Physician leading conference: Dr. Elijah Birk Social Worker Present: Cecile Sheerer, LCSWA Nurse Present: Vedia Pereyra, RN PT Present: Amedeo Plenty, PT OT Present: Jake Shark, OT SLP Present: Feliberto Gottron, SLP PPS Coordinator present : Fae Pippin, SLP     Current Status/Progress Goal Weekly Team Focus  Bowel/Bladder   Pt is continent B/B with some bladder incontinent episodes.  LBM 07/24/22   Will regain bladder incntinence   Toilet pt q3 hours qshift/prn    Swallow/Nutrition/ Hydration   Regular textures with thin liquids via cup (no straw), intermittent supervision   Mod I  tolerance of current diet, trials of thin liquids via straw, use of swallowing compensatory strategies to maximize overalls afety with PO intake.    ADL's   (S) UB ADLs, max A LB ADLs, mod A transfers toward the R, Max A toward the L. Poor trunk control sitting and standing- L lean. Barriers include high burden of care for family, high fall risk   min A ADLs and transfers   ADL retraining to reduce caregiver burden, transfer training, postural control, dynamic balance    Mobility   ModA for all functional mobility without the use of an AD- Limited by impaired L awareness with Lt lateral lean and poor standing tolerance   MinA/Supv with LRAD  Barriers-  Burden of care at CLOF. Continue to work toward progressing assist level with functional mobility- sit/stands, transfers for improved independence and decreased burden of care with focus on improved midline posturing and L attention.    Communication   Min-Mod A for speech intelligibility at the sentence level   Mod I at the conversation level   use of speech intelligibility strategies at the sentence level during structured and informal converation, self-monitoring and correcting communication breakdown    Safety/Cognition/ Behavioral Observations  Min A for complex problem solving   Mod I   functional problem solving with mildly complex tasks to maximize independence and overall safety    Pain   Denies pain at this time   Will be free from pain   Assess pt for pain qshift/prn    Skin   Skin is intact   Will maintain skin intergrity  Assess skin qshift/prn for breakdown      Discharge Planning:  Plan is for pt to discharge to home with his wife who will be primary caregiver. Pt will need to be light Min Asst to supersvision as his wife has back issues. She states she will provide the support needed. Children and relativeds work during the day. SW will confirm there are no barriers to discharge.   Team Discussion: PML. Wife with back issues and patient needs to be as independent as possible. She is working on getting assistance through [employer] and disability. Medications provided to improve constipation. Patient noted with increased extensor tone to left upper/lower extremities,  poor carryover noted from one session to the next. Responds well to skilled cueing with biggest barriers remain burden of care at discharge, poor safety awareness, and very high risk to fall.  Patient on target to meet rehab goals: Will continue to work towards goals and will adjust according to plan of care.  *See Care Plan and progress notes for long and short-term goals.   Revisions to Treatment Plan:   Monitoring blood pressure. Speech adding memory goal. Monitor labs/VS. Orlene Erm with patient/wife for realistic goals/plan of care to move forward Teaching Needs: Medications, safety, skin/wound care, self care, gait/transfer training, etc.   Current Barriers to Discharge: Decreased caregiver support, Wound care, Lack of/limited family support, Medication compliance, and Behavior  Possible Resolutions to Barriers: Family education. Goals of care. Skin & wound care. Order recommended DME     Medical Summary Current Status: medically complicted by PML with cognitive deficits, medication noncompliance,  uncontrolled HIV, hypertension, constipation, hyperglycemia, and intermittent chest pain  Barriers to Discharge: Behavior/Mood;Medical stability;Self-care education;Uncontrolled Hypertension;Uncontrolled Diabetes  Barriers to Discharge Comments: cognitive deficits, constipaiton, hypertension, medication noncompliance, chest pain Possible Resolutions to [employer] Focus: Monitorring labs for nutritional end electrolyte deficiencies as well as monitorring for HIV status, adjusting medications for HTN and constipation   Continued Need for Acute Rehabilitation Level of Care: The patient requires daily medical management by a physician with specialized training in physical medicine and rehabilitation for the following reasons: Direction of a multidisciplinary physical rehabilitation program to maximize functional independence : Yes Medical management of patient stability for increased activity during participation in an intensive rehabilitation regime.: Yes Analysis of laboratory values and/or radiology reports with any subsequent need for medication adjustment and/or medical intervention. : Yes   I attest that I was present, lead the team conference, and concur with the assessment and plan of the team.   Jearld Adjutant 07/25/2022, 10:28 AM

## 2022-07-25 NOTE — Progress Notes (Signed)
Occupational Therapy Weekly Progress Note  Patient Details  Name: Jose Lamb MRN: [id number] Date of Birth: 10-Jul-1960  Beginning of progress report period: July 20, 2022 End of progress report period: July 25, 2022   Patient has met 3 of 3 short term goals. [name] has made steady progress toward his OT goals. He has met his short term goals but requires heavy skilled cueing to find midline sitting and standing. He can complete UB ADLs at (S) level and LB with mod-max A, including toileting tasks. Family education will take place closer to d/c with his wife.   Patient continues to demonstrate the following deficits: muscle weakness, decreased cardiorespiratoy endurance, abnormal tone, ataxia, decreased coordination, and decreased motor planning, decreased attention to left, decreased problem solving, decreased safety awareness, decreased memory, and delayed processing, and decreased standing balance, decreased postural control, hemiplegia, and decreased balance strategies and therefore will continue to benefit from skilled OT intervention to enhance overall performance with BADL and Reduce care partner burden.  Patient progressing toward long term goals..  Continue plan of care.  OT Short Term Goals Week 1:  OT Short Term Goal 1 (Week 1): Patient will complete UB dressing at Min A utilizing hemi dressing techniques OT Short Term Goal 1 - Progress (Week 1): Met OT Short Term Goal 2 (Week 1): Pt will complete toilet transfer with Mod A consistantly to right or left utilizing grab bars and elevated toilet seat. OT Short Term Goal 2 - Progress (Week 1): Met OT Short Term Goal 3 (Week 1): Pt will increase functional use of left UE while using it as a stabilizer during self car tasks and self feeding. OT Short Term Goal 3 - Progress (Week 1): Met Week 2:  OT Short Term Goal 1 (Week 2): Pt will complete LB dressing with min A using LRAD OT Short Term Goal 2 (Week 2): Pt will complete toileting  tasks with mod A OT Short Term Goal 3 (Week 2): Pt will complete stand pivot transfers with min A  Crissie Reese 07/25/2022, 10:18 AM

## 2022-07-25 NOTE — Progress Notes (Signed)
Patient ID: Jose Lamb, male   DOB: [birth year], 62 y.o.   MRN: [id number]   [id number] spoke with pt wife Jose Lamb to provide updates from team conference, and d/c date 7/3. SW discussed pt will require a lot of physical support at discharge. Discussed scheduling family education this week and next week so she can be prepared on how to care for her husband. Will discuss with her sister availability this Friday and she will follow-up with SW.  *SW met with pt in room to provide above updates.  Jose Lamb, MSW, LCSWA Office: [phone] Cell: [phone] Fax: [phone]

## 2022-07-25 NOTE — Progress Notes (Signed)
PROGRESS NOTE   Subjective/Complaints:  Per therapies, increased tone in LLE complicating transfers; discussed with patient today, he does note some pain and "sensations" in his left leg mostly. States balance is his main issue. Otherwise, no complaints.  No events overnight.   ROS:  +LLE weakness/spasticity - ongoing + Balance issues - ongoing + Constipation - resolved  Denies fevers, chills, N/V, abdominal pain, diarrhea, SOB, cough, chest pain, new weakness or paraesthesias.    Objective:   No results found. Recent Labs    07/24/22 0619  WBC 5.8  HGB 14.0  HCT 41.9  PLT 338    Recent Labs    07/24/22 0619  NA 136  K 4.1  CL 100  CO2 23  GLUCOSE 103*  BUN 15  CREATININE 0.93  CALCIUM 9.6     Intake/Output Summary (Last 24 hours) at 07/25/2022 0910 Last data filed at 07/25/2022 0717 Gross per 24 hour  Intake 930 ml  Output 525 ml  Net 405 ml      Pressure Injury 07/13/22 Sacrum Medial Stage 2 -  Partial thickness loss of dermis presenting as a shallow open injury with a red, pink wound bed without slough. Skin Tear Intergluteal Cleft (Active)  07/13/22 1130  Location: Sacrum  Location Orientation: Medial  Staging: Stage 2 -  Partial thickness loss of dermis presenting as a shallow open injury with a red, pink wound bed without slough.  Wound Description (Comments): Skin Tear Intergluteal Cleft  Present on Admission: Yes    Physical Exam: Vital Signs Blood pressure (!) 119/100, pulse 88, temperature 98.6 F (37 C), temperature source Oral, resp. rate 18, height 5\' 11"  (1.803 m), weight 92.1 kg, SpO2 97 %.  Constitutional: No apparent distress. Appropriate appearance for age.Laying in bed HENT: [place], AT, MMM Eyes: PERRLA. EOMI. Visual fields grossly intact.  Cardiovascular: RRR, no murmurs/rub/gallops. No Edema.  Respiratory: CTAB. No rales, rhonchi, or wheezing. On RA.  Abdomen: + bowel sounds,  normoactive. No distention or tenderness.  Skin: C/D/I. No apparent lesions. + [name] IV, c/d/I.  + Stage 2 to sacrum - from 6/7 admission + Skin tears to penis - see photos  MSK:      No apparent deformity.      Strength: 5/5 BL UE RLE 4+/5,  LLE 3/5 HF, 4-/5 KE, 4-/5 DF, PF    MAS 2 hip flexors, knee extensors; MAS 1 PF  Neurologic exam:  Cognition: AAO to person, place, time and event.  + Delayed recall + Delayed motor initiation + Mild cognitive deficit  Language: Fluent, No substitutions or neoglisms. Mild dysarthria.  Insight: Good  insight into current condition.  Mood: Flat affect.  Sensation: To light touch intact b/l. CN: 2-12 grossly intact.        Assessment/Plan: 1. Functional deficits which require 3+ hours per day of interdisciplinary therapy in a comprehensive inpatient rehab setting. Physiatrist is providing close team supervision and 24 hour management of active medical problems listed below. Physiatrist and rehab team continue to assess barriers to discharge/monitor patient progress toward functional and medical goals  Care Tool:  Bathing    Body parts bathed by patient: Right arm, Left arm,  Chest, Abdomen, Front perineal area, Face   Body parts bathed by helper: Buttocks, Right upper leg, Left upper leg, Right lower leg, Left lower leg     Bathing assist Assist Level: Moderate Assistance - Patient 50 - 74%     Upper Body Dressing/Undressing Upper body dressing   What is the patient wearing?: Pull over shirt    Upper body assist Assist Level: Moderate Assistance - Patient 50 - 74%    Lower Body Dressing/Undressing Lower body dressing      What is the patient wearing?: Pants     Lower body assist Assist for lower body dressing: Dependent - Patient 0%     Toileting Toileting    Toileting assist Assist for toileting: Maximal Assistance - Patient 25 - 49%     Transfers Chair/bed transfer  Transfers assist  Chair/bed transfer activity  did not occur: Safety/medical concerns  Chair/bed transfer assist level: Maximal Assistance - Patient 25 - 49%     Locomotion Ambulation   Ambulation assist      Assist level: 2 helpers Assistive device: Walker-rolling Max distance: 12'   Walk 10 feet activity   Assist     Assist level: 2 helpers Assistive device: Walker-rolling   Walk 50 feet activity   Assist Walk 50 feet with 2 turns activity did not occur: Safety/medical concerns         Walk 150 feet activity   Assist Walk 150 feet activity did not occur: Safety/medical concerns         Walk 10 feet on uneven surface  activity   Assist Walk 10 feet on uneven surfaces activity did not occur: Safety/medical concerns         Wheelchair     Assist Is the patient using a wheelchair?: Yes Type of Wheelchair: Manual    Wheelchair assist level: Minimal Assistance - Patient > 75% Max wheelchair distance: 77'    Wheelchair 50 feet with 2 turns activity    Assist        Assist Level: Minimal Assistance - Patient > 75%   Wheelchair 150 feet activity     Assist      Assist Level: Maximal Assistance - Patient 25 - 49%   Blood pressure (!) 119/100, pulse 88, temperature 98.6 F (37 C), temperature source Oral, resp. rate 18, height 5\' 11"  (1.803 m), weight 92.1 kg, SpO2 97 %.  Medical Problem List and Plan: 1. 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 Pt with subsequent left greater than right weakness and sensory loss particularly involving the lower extremities.              -patient may shower             -ELOS/Goals: 12-16 days  - 08/09/22  DC date             - Continue CIR PT/OT/SLP   - Per family request, HIV diagnosis to remain confidential between wife and patient; do not inform other family  - 6/18: Per wife needs intermittent SPV goals, not realistic given therapies. He does well  with cueing. Max A with sitting balance, transfers, and standing. Requires a lot skilled, verbal, and tactile cues for transferring due to no carryover during and between sessions. LLE shooting out. Burden of care remains high; will work more on STS and transfers than higher-level dynamics. Per SLP on full diet working on speech intelligibility, will do memory eval.  2.  Antithrombotics: -DVT/anticoagulation:  Pharmaceutical: Eliquis             -antiplatelet therapy: none   3. 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 Mood/Behavior/Sleep: LCSW to evaluate and provide emotional support             -antipsychotic agents: n/a   5. Neuropsych/cognition: This patient is capable of making decisions on his own behalf.   - SLP eval   - Neuropsych eval 6/18: While the patient clearly had some cognitive issues most of these were noted having to do with patterns consistent with midbrain and subcortical dysfunction and dysregulation. Patient denied significant depression or anxiety and denied significant coping difficulties with his current hospitalization although he is anxious about how much she will be able to improve even with therapies.    6. Skin/Wound Care: Routine skin care checks  - Sacral stage 2 - offloading with foam and bed mobility  - Penile wounds - keep dry and monitor   7. Fluids/Electrolytes/Nutrition: Routine Is and Os and follow-up chemistries             -carb modified diet   - 6/14: Eating 25-100% meals. SLP dysphagia screen d/t mild dysarthria today.  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 fib: continue Eliquis for PE  -HR controlled   15: DM-2; A1c = 6.6% on 04/10/2022; (no home meds listed)             -serum glucose range: 89-119             -continue carb modified diet  - No CBGs, monitor BID AC for now  - 6/16-17 very well-controlled; can consider DC if remain low with adequate PO   - 6/28: well controlled, POs 25-75%, DC CBG   Recent Labs    07/24/22 0614 07/24/22 1630 07/25/22 0636  GLUCAP 92 125* 100*      16: Acute PE and right lower extremity DVT             -continue Eliquis -> 10 to 5 mg BID dosing 6/17   17: Obesity: BMI 32    18: Chest pain 6/11>>negative trop x 2; EKG non-acute             -attributed to PE versus musculoskeletal   - 6/14: Improved with lidoderm patch. Encourage incentive spirometry.  -6/16 reports this is improved today.   19. Constipation. Schedule Sennakot-S 1 tab QHS and add PRN Miralax daily 17 g.   -6/15 reports improved with BM yesterday   -6/16 denies feeling constipated   - LBM 6/17  20. Spasticity. Extensor tone in [name] and LLE per therapies.  - 6/18:  added Baclofen 5 mg TID. Consider PRAFO.   LOS: 6 days A FACE TO  FACE EVALUATION WAS PERFORMED  Angelina Sheriff 07/25/2022, 9:10 AM

## 2022-07-25 NOTE — Progress Notes (Signed)
Speech Language Pathology Daily Session Note  Patient Details  Name: Jose Lamb MRN: [id number] Date of Birth: 05/12/1960  Today's Date: 07/25/2022 SLP Individual Time: 6269-4854 SLP Individual Time Calculation (min): 64 min  Short Term Goals: Week 1: SLP Short Term Goal 1 (Week 1): Patient will consume current diet with supervision level verbal cues for use of swallowing compensatory strategies. SLP Short Term Goal 2 (Week 1): Patient will utilize speech intelligibility strategies at the sentence level to achieve ~90% intelligibility with Min verbal cues. SLP Short Term Goal 3 (Week 1): Patient will demonstrate functional problem solving for mildly complex tasks with Min verbal cues.  Skilled Therapeutic Interventions:   Pt seen for skilled speech therapy targeting speech and cognition goals. Pt denied pain. Pt able to recall speech intelligibility strategies with 70% acc independently, improved to 100% acc using external memory aid. During structured conversation tasks, pt used strategies when provided min verbal cues and achieved 90% intelligibility. During problem solving tasks, pt had 80% acc for identifying the issue and was able to provide a logical solution in 100% of opportunities when provided min verbal cues. Some confusing and contradictory information noted during session (for example, pt initially stating his sister is a nurse then later stating his granddaughter is a Engineer, civil (consulting), or at one point stating he has 18 bedrooms in his house). Pt reports he's noticed new difficulty in word finding and naming (unable to recall the name of his work place or taking longer to think of object names). SLP instructed on word finding/circumlocution strategies and pt able to use these during structured tasks to name objects in room and pictures with 70% acc when provided mod verbal cues.   Pt requested to go back to bed and SLP provided assistance. Pt left in bed with his call bell in reach and bed  alarm activated. Continue targeting cognition next session and ensure carryover of speech intelligibility strategies.   Pain Pain Assessment Pain Scale: 0-10 Pain Score: 0-No pain Faces Pain Scale: No hurt  Therapy/Group: Individual Therapy  Alphonsus Sias 07/25/2022, 10:58 AM

## 2022-07-25 NOTE — Progress Notes (Signed)
Physical Therapy Session Note  Patient Details  Name: Jose Lamb MRN: [id number] Date of Birth: November 16, 1960  Today's Date: 07/25/2022 PT Individual Time: 1300-1340 PT Individual Time Calculation (min): 40 min   Short Term Goals: Week 1:  PT Short Term Goal 1 (Week 1): Pt will complete bed mobility consistently with modA. PT Short Term Goal 2 (Week 1): Pt will complete bed to chair transfer consistently with modA. PT Short Term Goal 3 (Week 1): Pt will ambluate x25' with modA and LRAD. PT Short Term Goal 4 (Week 1): Pt will initiate stair training.  Skilled Therapeutic Interventions/Progress Updates:    Chart reviewed and pt agreeable to therapy. Pt received semi-reclined in bed with c/o pain in sacrum that was not quantified. Session focused on LLE control with sit to stand and postural control to promote progression towards independent functional mobility. Pt initiated session with transfer to EOB using ModA + bed features MAX VC for trunk control. Pt noted to have poor insight into trunk control deficit with functional movements. Pt completed blocked practice of sit to stand with CGA + STEDY. In STEDY, pt noticed to have L knee buckling. Pt practice improving LLE posture with VC + TC for glute + quad activation. Pt able to hold good LLE posture with MAX VC for 3-5secs. Pt also practice lateral weight-shifts, however LLE buckling remained t/o exercise even with VC and TC. Pt then completed series of exercises for postural control including lateral/anterior/posterior leans and reaching for objects. Pt noted to have good control with MAX VC. Pt then reached for items in room from seated position and required MaxA to avoid LOB in sitting, demonstrating limited carry over. Pt then returned to bed with ModA + 2 to support trunk control. Pt also completed 2x10 glute raises and hip abd/add in supine. At end of session, pt was left semi-reclined in bed with alarm engaged, nurse call bell and all needs in  reach.     Therapy Documentation Precautions:  Precautions Precautions: Fall Precaution Comments: Lt hemi, poor sitting/standing balance Restrictions Weight Bearing Restrictions: No General:      Therapy/Group: Individual Therapy  Dionne Milo, PT, DPT 07/25/2022, 3:19 PM

## 2022-07-25 NOTE — Progress Notes (Signed)
Occupational Therapy Session Note  Patient Details  Name: Jose Lamb MRN: [id number] Date of Birth: Nov 30, 1960  Session 1  Today's Date: 07/25/2022 OT Individual Time: 4098-1191 OT Individual Time Calculation (min): 45 min   Session 2  Today's Date: 07/25/2022 OT Individual Time: 1400-1500 OT Individual Time Calculation (min): 60 min    Short Term Goals: Week 1:  OT Short Term Goal 1 (Week 1): Patient will complete UB dressing at Min A utilizing hemi dressing techniques OT Short Term Goal 2 (Week 1): Pt will complete toilet transfer with Mod A consistantly to right or left utilizing grab bars and elevated toilet seat. OT Short Term Goal 3 (Week 1): Pt will increase functional use of left UE while using it as a stabilizer during self car tasks and self feeding.  Skilled Therapeutic Interventions/Progress Updates:   Session 1  Pt received supine with no c/o pain, agreeable to OT session. He came to EOB with improved initiation and LLE management, CGA overall. Stand pivot with mod A to the w/c. W/c > BSC in walk in shower with BUE support on the grab bar with min A. He completed UB bathing with (S) seated. Much improved sitting balance today. He still requires cueing for LLE placement before transfers and when sitting. He completed LB bathing with improved positioning on the BSC, reaching through the front to reach his bottom seated- CGA. Min A to reach feet. Transfer back to w/c. Oral care seated at sink with (S). Shirt donned with cueing for orientation and min A. Pants donned with mod A sit <> stand. In standing he required verbal/tactile cueing and mod A for L lean. Pt has improved in his midline awareness- using the mirror to attempt and correct. He was passed off to SLP in room.    Session 2 Pt received supine with no c/o pain, agreeable to OT session. Cueing required for L LE management to EOB and sequencing bed mobility. Once he followed this cueing he was able to complete bed  mobility with (S) overall. Stand pivot with heavy mod A toward the L. Pt was taken via w/c to the therapy gym for time management. Step son Huel Coventry present and supportive. From EOM pt stood x3 trials for standing level functional reaching activity- combo of standing balance focus with [name] dynamic reaching for NMR. He required mod cueing for visual attention to the L and was able to grasp bean bags and put into eye level target with 75% accuracy. With posterior support on the mat he was able to maintain balance standing with min-mod A. He then transitioned into supine and completed glute bridges with facilitation required at the LLE. 3x10 repetitions with ball used between legs to promote adduction and glute med activation. He also completed several bimanual integration based activities with R/[name] holding a ball and crossing midline. He continues to demonstrate slightly worse extensor tone in the [name]. He returned to the w/c and to his room. He was left supine with all needs met, bed alarm set.    Therapy Documentation Precautions:  Precautions Precautions: Fall Precaution Comments: Lt hemi, poor sitting/standing balance Restrictions Weight Bearing Restrictions: No   Therapy/Group: Individual Therapy  Crissie Reese 07/25/2022, 7:58 AM

## 2022-07-26 DIAGNOSIS — A812 Progressive multifocal leukoencephalopathy: Secondary | ICD-10-CM | POA: Diagnosis not present

## 2022-07-26 LAB — GLUCOSE, CAPILLARY: Glucose-Capillary: 115 mg/dL — ABNORMAL HIGH (ref 70–99)

## 2022-07-26 MED ORDER — BACLOFEN 10 MG PO TABS
10.0000 mg | ORAL_TABLET | Freq: Two times a day (BID) | ORAL | Status: DC
  Administered 2022-07-26 – 2022-07-27 (×2): 10 mg via ORAL
  Filled 2022-07-26 (×2): qty 1

## 2022-07-26 NOTE — Plan of Care (Signed)
  Problem: RH BLADDER ELIMINATION Goal: RH STG MANAGE BLADDER WITH ASSISTANCE Description: STG Manage Bladder With min Assistance Outcome: Progressing   

## 2022-07-26 NOTE — Progress Notes (Signed)
Physical Therapy Session Note  Patient Details  Name: Jose Lamb MRN: [id number] Date of Birth: 25-Feb-1960  Today's Date: 07/26/2022 PT Individual Time: 0800-0856 PT Individual Time Calculation (min): 56 min    Short Term Goals: Week 1:  PT Short Term Goal 1 (Week 1): Pt will complete bed mobility consistently with modA. PT Short Term Goal 2 (Week 1): Pt will complete bed to chair transfer consistently with modA. PT Short Term Goal 3 (Week 1): Pt will ambluate x25' with modA and LRAD. PT Short Term Goal 4 (Week 1): Pt will initiate stair training.  Skilled Therapeutic Interventions/Progress Updates:       Pt in bed to start - reports he has been awake since 3AM but this is "normal" for him. Denies any pain but requests assistance in getting dressed out of his hospital gown. Donned pants at bed level with maxA for threading and pulling over hips via bridging - pt able to assist some. Supine<>Sitting EOB with minA for LLE only and needing assist for balance due to posterior leaning while seated EOB - improves with hand rail support and cues for anterior weight shifting. Pt needing maxA for forward scooting to achieve feet flat to provide adequate BOS. ModA squat<>pivot transfer from EOB to w/c, towards his stronger R side. Pt mildly impulsive - reaching for various items outside his reach needing cues for safety awareness. UB dressing completed with modA for time management.  Transported to main rehab gym and setup at high/low table to work on standing in modified plantigrade position to allow weight bearing through [name]. MinA to stand to table and then Min to modA for standing balance during task - flexed trunk and persistent L lean - corrects with cues but difficult to sustain correction. Various tasks completed in modified plantigrade - cone stacking, reaching, lateral weight shifting, TKE on LLE, squats, etc. ModA overall for tasks and L knee blocked to prevent buckling.   Pt returned to  his room and concluded session seated in w/c with safety belt alarm on, call bell within reach, and personal items next to him. NT made aware of patient's status. All needs met at end.    Therapy Documentation Precautions:  Precautions Precautions: Fall Precaution Comments: Lt hemi, poor sitting/standing balance Restrictions Weight Bearing Restrictions: No General:     Therapy/Group: Individual Therapy  Jose Lamb P Jose Lamb  PT, DPT, CSRS  07/26/2022, 7:34 AM

## 2022-07-26 NOTE — Progress Notes (Signed)
Physical Therapy Session Note  Patient Details  Name: Jose Lamb MRN: [id number] Date of Birth: 11-14-1960  Today's Date: 07/26/2022 PT Individual Time: 1133-1202 PT Individual Time Calculation (min): 29 min   Short Term Goals: Week 1:  PT Short Term Goal 1 (Week 1): Pt will complete bed mobility consistently with modA. PT Short Term Goal 2 (Week 1): Pt will complete bed to chair transfer consistently with modA. PT Short Term Goal 3 (Week 1): Pt will ambluate x25' with modA and LRAD. PT Short Term Goal 4 (Week 1): Pt will initiate stair training.  Skilled Therapeutic Interventions/Progress Updates:    Pt presents in room in bed, agreeable to PT. Pt reporting soreness in L hip which he attributes to muscle fatigue with exercise, denies intervention. Session focused on NMR for BUE/BLE/trunk coordination as well as seated balance to facilitate improved independence with functional mobility.  Pt completes supine to sit EOB with hospital bed rails min assist with verbal cues for managing LLE off bed and for immediate sitting balance.  Pt completes pull to stand to [facility], min assist with verbal cues for [name] placement on stedy. Pt demonstrates L knee hyperextension in stance, prompted to complete squats to promote L knee control with pt unable to unlock L knee from hyperextended position. Pt takes seated rest break then attempts again, therapist providing min/mod assist to initiate knee flexion for first 3 reps then pt able to complete knee flexion to extension with CGA, min/mod assist for postural stability throughout exercise as pt demonstrates increasing L lateral lean with fatigue.  Pt then returns to sitting EOB, pt completes seated NMR for improved postural stability, BLE/core muscle fiber recruitment, and postural awareness including: - maintaining lateral lean onto elbow opposite UE propped on same side thigh x10 BLE marches in this position (requires cues to maintain position without  posterior LOB, improves with repetition and cueing) - seated hamstring curl LLE x10, verbal cues for postural stability and [name] hand placement - scooting EOB to R (min assist for L foot position on floor, verbal cues for postural awareness)  Pt requires rest breaks between exercises, completed in short sitting EOB with modified reclined position with pillows positioned posterior to pt.  Pt completes sit to supine using hospital bed rail, requires min/mod assist for BLEs into bed, verbal cues provided for sequencing. Pt requires min assist for repositioning in bed. Pt remains supine with all needs in reach, call light in place, 4 bed rails up per pt request, and bed alarm activated at end of session.  Therapy Documentation Precautions:  Precautions Precautions: Fall Precaution Comments: Lt hemi, poor sitting/standing balance Restrictions Weight Bearing Restrictions: No   Therapy/Group: Individual Therapy  Edwin Cap PT, DPT 07/26/2022, 12:24 PM

## 2022-07-26 NOTE — Progress Notes (Signed)
PROGRESS NOTE   Subjective/Complaints: No events overnight.  Patient endorsing improvement in muscle tightness but still having spasticity in his left lower extremity is walking with PT this morning and continuing to have left leg kick out. No side effects from initiation of baclofen.  Otherwise, feeling well.  ROS:  + LLE weakness/spasticity -improved + Balance issues - ongoing + Constipation - resolved  Denies fevers, chills, N/V, abdominal pain, diarrhea, SOB, cough, chest pain, new weakness or paraesthesias.    Objective:   No results found. Recent Labs    07/24/22 0619  WBC 5.8  HGB 14.0  HCT 41.9  PLT 338    Recent Labs    07/24/22 0619  NA 136  K 4.1  CL 100  CO2 23  GLUCOSE 103*  BUN 15  CREATININE 0.93  CALCIUM 9.6     Intake/Output Summary (Last 24 hours) at 07/26/2022 0830 Last data filed at 07/26/2022 0981 Gross per 24 hour  Intake 360 ml  Output 200 ml  Net 160 ml      Pressure Injury 07/13/22 Sacrum Medial Stage 2 -  Partial thickness loss of dermis presenting as a shallow open injury with a red, pink wound bed without slough. Skin Tear Intergluteal Cleft (Active)  07/13/22 1130  Location: Sacrum  Location Orientation: Medial  Staging: Stage 2 -  Partial thickness loss of dermis presenting as a shallow open injury with a red, pink wound bed without slough.  Wound Description (Comments): Skin Tear Intergluteal Cleft  Present on Admission: Yes    Physical Exam: Vital Signs Blood pressure (!) 109/92, pulse 78, temperature 98.4 F (36.9 C), temperature source Oral, resp. rate 18, height 5\' 11"  (1.803 m), weight 92.1 kg, SpO2 98 %.  Constitutional: No apparent distress. Appropriate appearance for age.  Sitting at bedside in wheelchair, lethargic. HENT: [place], AT, MMM Eyes: PERRLA. EOMI. Visual fields grossly intact.  Cardiovascular: RRR, no murmurs/rub/gallops. No Edema.  Respiratory:  CTAB. No rales, rhonchi, or wheezing. On RA.  Abdomen: + bowel sounds, normoactive. No distention or tenderness.  Skin: C/D/I. No apparent lesions. + [name] IV, c/d/I.  + Stage 2 to sacrum - from 6/7 admission + Skin tears to penis - see photos  MSK:      No apparent deformity.      Strength: 5/5 BL UE RLE 4+/5,  LLE 3/5 HF, 4-/5 KE, 4-/5 DF, PF    MAS 1+ hip flexors, MAS 0 knee extensors; MAS 0 PF -much improved from prior assessment  Neurologic exam:  Cognition: Mild lethargy, however easily arousable and AAO to person, place, time and event.  + Delayed recall + Delayed motor initiation + Mild cognitive deficit  Language: Fluent, No substitutions or neoglisms. Mild dysarthria.  Insight: Good  insight into current condition.  Mood: Flat affect.  Sensation: To light touch intact b/l. CN: 2-12 grossly intact.        Assessment/Plan: 1. Functional deficits which require 3+ hours per day of interdisciplinary therapy in a comprehensive inpatient rehab setting. Physiatrist is providing close team supervision and 24 hour management of active medical problems listed below. Physiatrist and rehab team continue to assess barriers to discharge/monitor patient  progress toward functional and medical goals  Care Tool:  Bathing    Body parts bathed by patient: Right arm, Left arm, Chest, Abdomen, Front perineal area, Face   Body parts bathed by helper: Buttocks, Right upper leg, Left upper leg, Right lower leg, Left lower leg     Bathing assist Assist Level: Moderate Assistance - Patient 50 - 74%     Upper Body Dressing/Undressing Upper body dressing   What is the patient wearing?: Pull over shirt    Upper body assist Assist Level: Moderate Assistance - Patient 50 - 74%    Lower Body Dressing/Undressing Lower body dressing      What is the patient wearing?: Pants     Lower body assist Assist for lower body dressing: Dependent - Patient 0%     Toileting Toileting     Toileting assist Assist for toileting: Maximal Assistance - Patient 25 - 49%     Transfers Chair/bed transfer  Transfers assist  Chair/bed transfer activity did not occur: Safety/medical concerns  Chair/bed transfer assist level: Maximal Assistance - Patient 25 - 49%     Locomotion Ambulation   Ambulation assist      Assist level: 2 helpers Assistive device: Walker-rolling Max distance: 12'   Walk 10 feet activity   Assist     Assist level: 2 helpers Assistive device: Walker-rolling   Walk 50 feet activity   Assist Walk 50 feet with 2 turns activity did not occur: Safety/medical concerns         Walk 150 feet activity   Assist Walk 150 feet activity did not occur: Safety/medical concerns         Walk 10 feet on uneven surface  activity   Assist Walk 10 feet on uneven surfaces activity did not occur: Safety/medical concerns         Wheelchair     Assist Is the patient using a wheelchair?: Yes Type of Wheelchair: Manual    Wheelchair assist level: Minimal Assistance - Patient > 75% Max wheelchair distance: 53'    Wheelchair 50 feet with 2 turns activity    Assist        Assist Level: Minimal Assistance - Patient > 75%   Wheelchair 150 feet activity     Assist      Assist Level: Maximal Assistance - Patient 25 - 49%   Blood pressure (!) 109/92, pulse 78, temperature 98.4 F (36.9 C), temperature source Oral, resp. rate 18, height 5\' 11"  (1.803 m), weight 92.1 kg, SpO2 98 %.  Medical Problem List and Plan: 1. 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 Pt with subsequent left greater than right weakness and sensory loss particularly involving the lower extremities.              -patient may shower             -ELOS/Goals: 12-16 days  - 08/09/22  DC date             - Continue CIR PT/OT/SLP   - Per family request, HIV diagnosis to  remain confidential between wife and patient; do not inform other family  - 6/18: Per wife needs intermittent SPV goals, not realistic given therapies. He does well with cueing. Max A with sitting balance, transfers, and standing. Requires a lot skilled, verbal, and tactile cues for transferring due to no carryover during and between sessions. LLE shooting out. Burden of care remains high;  will work more on STS and transfers than higher-level dynamics. Per SLP on full diet working on speech intelligibility, will do memory eval.  2.  Antithrombotics: -DVT/anticoagulation:  Pharmaceutical: Eliquis             -antiplatelet therapy: none   3. 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 Mood/Behavior/Sleep: LCSW to evaluate and provide emotional support             -antipsychotic agents: n/a   5. Neuropsych/cognition: This patient is capable of making decisions on his own behalf.   - SLP eval   - Neuropsych eval 6/18: While the patient clearly had some cognitive issues most of these were noted having to do with patterns consistent with midbrain and subcortical dysfunction and dysregulation. Patient denied significant depression or anxiety and denied significant coping difficulties with his current hospitalization although he is anxious about how much she will be able to improve even with therapies.    6. Skin/Wound Care: Routine skin care checks  - Sacral stage 2 - offloading with foam and bed mobility  - Penile wounds - keep dry and monitor   7. Fluids/Electrolytes/Nutrition: Routine Is and Os and follow-up chemistries             -carb modified diet   - 6/14: Eating 25-100% meals. SLP dysphagia screen d/t mild dysarthria today.  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 fib: continue Eliquis for PE  -HR controlled   15: DM-2; A1c = 6.6% on 04/10/2022; (no home meds listed)             -serum glucose range: 89-119             -continue carb modified diet  - No CBGs, monitor BID AC for now  - 6/16-17 very well-controlled; can consider DC if remain low with adequate PO   - 6/18: well controlled, POs 25-75%, DC CBG 6/19   Recent Labs    07/25/22 0636 07/25/22 1651 07/26/22 0632  GLUCAP 100* 107* 115*      16: Acute PE and right lower extremity DVT             -continue Eliquis -> 10 to 5 mg BID dosing 6/17   17: Obesity: BMI 32    18: Chest pain 6/11>>negative trop x 2; EKG non-acute             -attributed to PE versus musculoskeletal   - 6/14: Improved with lidoderm patch. Encourage incentive spirometry.  -6/16 reports this is improved today.   19. Constipation. Schedule Sennakot-S 1 tab QHS and add PRN Miralax daily 17 g.   -6/15 reports improved with BM yesterday   -6/16 denies feeling constipated   - LBM 6/17  20. Spasticity. Extensor tone  in [name] and LLE per therapies.  - 6/18:  added Baclofen 5 mg TID. Consider PRAFO.  - 6/19: Benefit with ongoing mild spasticity, increased to 10 mg twice daily  LOS: 7 days A FACE TO FACE EVALUATION WAS PERFORMED  Angelina Sheriff 07/26/2022, 8:30 AM

## 2022-07-26 NOTE — Progress Notes (Signed)
Speech Language Pathology Daily Session Note  Patient Details  Name: Rush Brust MRN: [id number] Date of Birth: 20-May-1960  Today's Date: 07/26/2022 SLP Individual Time: 1400-1500 SLP Individual Time Calculation (min): 60 min  Short Term Goals: Week 1: SLP Short Term Goal 1 (Week 1): Patient will consume current diet with supervision level verbal cues for use of swallowing compensatory strategies. SLP Short Term Goal 2 (Week 1): Patient will utilize speech intelligibility strategies at the sentence level to achieve ~90% intelligibility with Min verbal cues. SLP Short Term Goal 3 (Week 1): Patient will demonstrate functional problem solving for mildly complex tasks with Min verbal cues.  Skilled Therapeutic Interventions:   SLP facilitated tasks targeting motor speech production and thought formulation/specific word finding. Pt noted to recall compensatory speech strategies INDly. During structured and functional speech tasks at sentence/conversation level, pt benefited from spv cues to maintain 85% speech intelligibility or greater. Adequate verbal problem solving noted during complex conversation re home environment and tentative d/c plans. Pt also completed complex thought formulation and naming tasks via item description and complex responsive naming, benefiting from minA verbal cues overall. Recommend cont ST per POC. Pt left in bed w/ bed alarm set. Personal items and call light within reach.   Pain  No pain reported.   Therapy/Group: Individual Therapy  Pati Gallo, M.S. CCC-SLP 07/26/2022, 4:57 PM

## 2022-07-26 NOTE — Progress Notes (Signed)
Occupational Therapy Session Note  Patient Details  Name: Jose Lamb MRN: [id number] Date of Birth: 1960-05-10  Today's Date: 07/26/2022 OT Individual Time: 4098-1191 OT Individual Time Calculation (min): 42 min    Short Term Goals: Week 2:  OT Short Term Goal 1 (Week 2): Pt will complete LB dressing with min A using LRAD OT Short Term Goal 2 (Week 2): Pt will complete toileting tasks with mod A OT Short Term Goal 3 (Week 2): Pt will complete stand pivot transfers with min A  Skilled Therapeutic Interventions/Progress Updates:    Patient received supine in bed agreeable to OT session.  Patient able to state that he is working on balance and use of left limbs.  Patient assisted to sitting - difficulty moving left leg to edge of bed.  Patient lacks ability to roll onto right side to transition to sitting with mod assist.  Patient needs max assist to squat pivot transfer to wheelchair toward left.  Patient with decreased midline awareness, and absent sensation left limbs.   Patient transported to gym - mod assist transfer toward right side.  Worked on postural control in sitting then standing with emphasis on midline, thoracic extension, hip flexion to transition, and stand to sit controlled descent.  Once trunk aligned over hips in sitting worked on guided mid to high reach patterns for [name].  Returned to room and transferred back to bed.  Bed alarm engaged and call bell/ phones and personal items in reach.    Therapy Documentation Precautions:  Precautions Precautions: Fall Precaution Comments: Lt hemi, poor sitting/standing balance Restrictions Weight Bearing Restrictions: No  Pain:  Denies pain      Therapy/Group: Individual Therapy  Collier Salina 07/26/2022, 12:41 PM

## 2022-07-27 ENCOUNTER — Other Ambulatory Visit (HOSPITAL_COMMUNITY): Payer: Self-pay

## 2022-07-27 DIAGNOSIS — A812 Progressive multifocal leukoencephalopathy: Secondary | ICD-10-CM | POA: Diagnosis not present

## 2022-07-27 MED ORDER — POLYETHYLENE GLYCOL 3350 17 G PO PACK
17.0000 g | PACK | Freq: Two times a day (BID) | ORAL | Status: DC
  Administered 2022-08-03 – 2022-08-11 (×4): 17 g via ORAL
  Filled 2022-07-27 (×30): qty 1

## 2022-07-27 MED ORDER — BACLOFEN 10 MG PO TABS
10.0000 mg | ORAL_TABLET | Freq: Three times a day (TID) | ORAL | Status: DC
  Administered 2022-07-27 – 2022-07-31 (×14): 10 mg via ORAL
  Filled 2022-07-27 (×14): qty 1

## 2022-07-27 NOTE — TOC Benefit Eligibility Note (Signed)
Pharmacy Patient Advocate Encounter  Insurance verification completed.    The patient is insured through [employer] test claim for Eliquis and the current 30 day co-pay is #35.00.   This test claim was processed through [facility]- copay amounts may vary at other pharmacies due to pharmacy/plan contracts, or as the patient moves through the different stages of their insurance plan.

## 2022-07-27 NOTE — Group Note (Addendum)
Patient Details Name: [name] MRN: [id number] DOB: 07-30-1960 Today's Date: 07/27/2022  Time Calculation: OT Group Time Calculation OT Group Start Time: 1430 OT Group Stop Time: 1530 OT Group Time Calculation (min): 60 min      Group Description: Dance Group: Pt participated in dance group with an emphasis on social interaction, motor planning, increasing overall activity tolerance and bimanual tasks. All songs were selected by group members. Dance moves included AROM of BUE/BLE gross motor movements with an emphasis on building functional endurance.    Individual level documentation: Patient completed group from sitting level. Patientt needed supervision to complete various dance moves with cues for trunk positioning and attention.  Patient needed min modifications during group.  Pain:  c/o hip/back pain, relieved with repositioning   Precautions:  Falls, L hemi, poor balance   Army Fossa 07/27/2022, 4:21 PM

## 2022-07-27 NOTE — Progress Notes (Signed)
PROGRESS NOTE   Subjective/Complaints: No events overnight. No acute complaints. Per therapy notes, continues to lock L knee into extension, unable to unlock to allow squat.  ROS:  + LLE weakness/spasticity -improved + Balance issues - ongoing + Constipation - resolved  Denies fevers, chills, N/V, abdominal pain, diarrhea, SOB, cough, chest pain, new weakness or paraesthesias.    Objective:   No results found. No results for input(s): "WBC", "HGB", "HCT", "PLT" in the last 72 hours.  No results for input(s): "NA", "K", "CL", "CO2", "GLUCOSE", "BUN", "CREATININE", "CALCIUM" in the last 72 hours.   Intake/Output Summary (Last 24 hours) at 07/27/2022 0849 Last data filed at 07/27/2022 0741 Gross per 24 hour  Intake 598 ml  Output 375 ml  Net 223 ml      Pressure Injury 07/13/22 Sacrum Medial Stage 2 -  Partial thickness loss of dermis presenting as a shallow open injury with a red, pink wound bed without slough. Skin Tear Intergluteal Cleft (Active)  07/13/22 1130  Location: Sacrum  Location Orientation: Medial  Staging: Stage 2 -  Partial thickness loss of dermis presenting as a shallow open injury with a red, pink wound bed without slough.  Wound Description (Comments): Skin Tear Intergluteal Cleft  Present on Admission: Yes    Physical Exam: Vital Signs Blood pressure 114/85, pulse 87, temperature 98 F (36.7 C), temperature source Oral, resp. rate 18, height 5\' 11"  (1.803 m), weight 92.1 kg, SpO2 99 %.  Constitutional: No apparent distress. Appropriate appearance for age.  Sitting in bed.  HENT: [place], AT, MMM Eyes: PERRLA. EOMI. Visual fields grossly intact.  Cardiovascular: RRR, no murmurs/rub/gallops. No Edema.  Respiratory: CTAB. No rales, rhonchi, or wheezing. On RA.  Abdomen: + bowel sounds, normoactive. No distention or tenderness.  Skin: C/D/I. No apparent lesions. + [name] IV, c/d/I.  + Stage 2 to sacrum -  from 6/7 admission + Skin tears to penis - see photos  MSK:      No apparent deformity.      Strength: 5/5 BL UE RLE 4+/5,  LLE 3/5 HF, 4-/5 KE, 4-/5 DF, PF   MAS 1 hip flexors, MAS 0 knee extensors; MAS 0 PF - improving  Neurologic exam:  Cognition: Mild lethargy, however easily arousable and AAO to person, place, time and event.  + Delayed recall + Delayed motor initiation + Mild cognitive deficit  Language: Fluent, No substitutions or neoglisms. Mild dysarthria.  Insight: Good  insight into current condition.  Mood: Flat affect.  Sensation: To light touch intact b/l. CN: 2-12 grossly intact.        Assessment/Plan: 1. Functional deficits which require 3+ hours per day of interdisciplinary therapy in a comprehensive inpatient rehab setting. Physiatrist is providing close team supervision and 24 hour management of active medical problems listed below. Physiatrist and rehab team continue to assess barriers to discharge/monitor patient progress toward functional and medical goals  Care Tool:  Bathing    Body parts bathed by patient: Right arm, Left arm, Chest, Abdomen, Front perineal area, Face   Body parts bathed by helper: Buttocks, Right upper leg, Left upper leg, Right lower leg, Left lower leg  Bathing assist Assist Level: Moderate Assistance - Patient 50 - 74%     Upper Body Dressing/Undressing Upper body dressing   What is the patient wearing?: Pull over shirt    Upper body assist Assist Level: Moderate Assistance - Patient 50 - 74%    Lower Body Dressing/Undressing Lower body dressing      What is the patient wearing?: Pants     Lower body assist Assist for lower body dressing: Dependent - Patient 0%     Toileting Toileting    Toileting assist Assist for toileting: Maximal Assistance - Patient 25 - 49%     Transfers Chair/bed transfer  Transfers assist  Chair/bed transfer activity did not occur: Safety/medical concerns  Chair/bed  transfer assist level: Maximal Assistance - Patient 25 - 49%     Locomotion Ambulation   Ambulation assist      Assist level: 2 helpers Assistive device: Walker-rolling Max distance: 12'   Walk 10 feet activity   Assist     Assist level: 2 helpers Assistive device: Walker-rolling   Walk 50 feet activity   Assist Walk 50 feet with 2 turns activity did not occur: Safety/medical concerns         Walk 150 feet activity   Assist Walk 150 feet activity did not occur: Safety/medical concerns         Walk 10 feet on uneven surface  activity   Assist Walk 10 feet on uneven surfaces activity did not occur: Safety/medical concerns         Wheelchair     Assist Is the patient using a wheelchair?: Yes Type of Wheelchair: Manual    Wheelchair assist level: Minimal Assistance - Patient > 75% Max wheelchair distance: 52'    Wheelchair 50 feet with 2 turns activity    Assist        Assist Level: Minimal Assistance - Patient > 75%   Wheelchair 150 feet activity     Assist      Assist Level: Maximal Assistance - Patient 25 - 49%   Blood pressure 114/85, pulse 87, temperature 98 F (36.7 C), temperature source Oral, resp. rate 18, height 5\' 11"  (1.803 m), weight 92.1 kg, SpO2 99 %.  Medical Problem List and Plan: 1. 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 Pt with subsequent left greater than right weakness and sensory loss particularly involving the lower extremities.              -patient may shower             -ELOS/Goals: 12-16 days  - 08/09/22  DC date             - Continue CIR PT/OT/SLP   - Per family request, HIV diagnosis to remain confidential between wife and patient; do not inform other family  - 6/18: Per wife needs intermittent SPV goals, not realistic given therapies. He does well with cueing. Max A with sitting balance, transfers, and  standing. Requires a lot skilled, verbal, and tactile cues for transferring due to no carryover during and between sessions. LLE shooting out. Burden of care remains high; will work more on STS and transfers than higher-level dynamics. Per SLP on full diet working on speech intelligibility, will do memory eval.  2.  Antithrombotics: -DVT/anticoagulation:  Pharmaceutical: Eliquis             -antiplatelet therapy: none   3. 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 Mood/Behavior/Sleep: LCSW to evaluate and provide emotional support             -antipsychotic agents: n/a   5. Neuropsych/cognition: This patient is capable of making decisions on his own behalf.   - SLP eval   - Neuropsych eval 6/18: While the patient clearly had some cognitive issues most of these were noted having to do with patterns consistent with midbrain and subcortical dysfunction and dysregulation. Patient denied significant depression or anxiety and denied significant coping difficulties with his current hospitalization although he is anxious about how much she will be able to improve even with therapies.    6. Skin/Wound Care: Routine skin care checks  - Sacral stage 2 - offloading with foam and bed mobility  - Penile wounds - keep dry and monitor   7. Fluids/Electrolytes/Nutrition: Routine Is and Os and follow-up chemistries             -carb modified diet   - 6/14: Eating 25-100% meals. SLP dysphagia screen d/t mild dysarthria today.  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 fib: continue Eliquis for PE  -HR controlled   15: DM-2; A1c = 6.6% on 04/10/2022; (no home meds listed)             -serum glucose range: 89-119             -continue carb modified diet  - No CBGs, monitor BID AC for now  - 6/16-17 very well-controlled; can consider DC if remain low with adequate PO   - 6/18: well controlled, POs 25-75%, DC CBG 6/19   Recent Labs    07/25/22 0636 07/25/22 1651 07/26/22 0632  GLUCAP 100* 107* 115*      16: Acute PE and right lower extremity DVT             -continue Eliquis -> 10 to 5 mg BID dosing 6/17   17: Obesity: BMI 32    18: Chest pain 6/11>>negative trop x 2; EKG non-acute             -attributed to PE versus musculoskeletal   - 6/14: Improved with lidoderm patch. Encourage incentive spirometry.  -6/16 reports this is improved today.   19. Constipation. Schedule Sennakot-S 1 tab QHS and add PRN Miralax daily 17 g.   -6/15 reports improved with BM yesterday   -6/16 denies feeling constipated   - LBM 6/20, large after at bedtime suppository; add Miralax 17g BID  20. Spasticity. Extensor tone in [name] and LLE per therapies. - 6/18:  added Baclofen 5 mg TID. Consider PRAFO.  - 6/19: Benefit with ongoing mild spasticity, increased to 10 mg twice daily - 6/20: Increase to 10 mg TID  LOS: 8 days A FACE TO FACE EVALUATION WAS PERFORMED  Angelina Sheriff 07/27/2022, 8:49 AM

## 2022-07-27 NOTE — Evaluation (Signed)
Recreational Therapy Assessment and Plan  Patient Details  Name: Jose Lamb MRN: [id number] Date of Birth: 07-Jun-1960 Today's Date: 07/27/2022  Rehab Potential:  Good ELOS:   d/c 7/3  Assessment Hospital Problem: Principal Problem:   PML (progressive multifocal leukoencephalopathy) (HCC)     Past Medical History:      Past Medical History:  Diagnosis Date   Atrial fibrillation (HCC)      In the setting of hyperthyroidism   Bilateral pulmonary embolism (HCC)      Diagnosed August 2021   Diabetes mellitus without complication (HCC)     Essential hypertension     HIV antibody positive (HCC)     Perforated duodenal ulcer (HCC) 2015    Contained without need for surgical intervention   Pneumonia due to COVID-19 virus      Diagnosed August 2021    Past Surgical History:       Past Surgical History:  Procedure Laterality Date   COLONOSCOPY   Sept 2015    [place]: transverse colon polyp with pathology reporting lymphoid nodule    ESOPHAGOGASTRODUODENOSCOPY N/A 03/30/2014    Procedure: ESOPHAGOGASTRODUODENOSCOPY (EGD);  Surgeon: Corbin Ade, MD;  Location: [facility];  Service: Endoscopy;  Laterality: N/A;  215   UMBILICAL HERNIA REPAIR N/A 12/16/2021    Procedure: HERNIA REPAIR UMBILICAL ADULT WITH MESH;  Surgeon: Franky Macho, MD;  Location: [facility] ORS;  Service: General;  Laterality: N/A;      Assessment & Plan Clinical Impression: Patient is a 62 year old male presented to [facility] ED on 07/05/2022 reporting a history of multiple falls and a recent stroke.  Chief complaints were allover soreness and weakness.  Workup was significant for possible urinary tract infection and hypokalemia.  He was placed on antibiotics and given potassium supplement and discharged home.  He re-presented to the emergency department at [facility] on 07/07/2022 complaining of an approximately 6-week history of left-sided weakness.  Notes indicate the patient was sent there  for MRI scanning.  Neurology was consulted and MRI of the brain revealed abnormal T2 signal of the cortical subcortical and right greater than left insular postcentral gyrus as well as splenium and body of corpus callosum and thalamus and midbrain.  This was concerning for infectious versus inflammatory process such as PML, autoimmune encephalitis, herpes simplex and enteritis.  Recommendations were for high-volume LP with labs, MRI of the brain with contrast and admission to [facility].  The patient's pertinent past medical history includes HIV, hypertension and diabetes mellitus.  The patient's history of HIV disease is longstanding and he has not been on ART for several years going back to 2017.  His most recent regimen was Descovy, darnunavir/ritonavir and etravirine.  Prior history of bilateral pulmonary embolism 2021, atrial fibrillation in the setting of hyperthyroidism.  The patient is not chronically anticoagulated.  Infectious disease consultation obtained by Dr. Earlene Plater on 6/01.  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  Given high-dose B12 daily. Imaging was also significant for bilateral parotid masses and ENT consultation placed for biopsy.  Ultrasound-guided aspiration of bilateral parotid cyst on 6/03 by Dr. Grace Isaac.  Negative for malignancy. He was admitted to [facility] on 6/07 and developed fever and tachypnea with tachycardia at  approximately 8 pm. He was transferred back to acute bed and found to have extensive bilateral pulmonary emboli with moderate-to-large thrombus volume and evidence of right heart strain. Placed on Eliquis. Tolerating heart healthy diet. The patient requires inpatient medicine and rehabilitation evaluations and services for ongoing dysfunction secondary to PML  involving the right>left insula and fronto-parietal regions d/t non-compliance with HIV regimen. Pt with subsequent left greater than right weakness and sensory loss particularly involving the lower extremities.  Patient transferred to CIR on 07/19/2022.   Pt presents with decreased activity tolerance, decreased functional mobility, decreased balance, decreased coordination, left inattention, decreased problem solving, decreased memory Limiting pt's independence with leisure/community pursuits.  Met with pt today to discuss TR services including leisure education, activity analysis/modifications and stress management.  Also discussed the importance of social, emotional, spiritual health in addition to physical health and their effects on overall health and wellness.  Pt stated understanding.   Plan  Min 1 TR session >20 minutes during LOS  Recommendations for other services: None  Discharge Criteria: Patient will be discharged from TR if patient refuses treatment 3 consecutive times without medical reason.  If treatment goals not met, if there is a change in medical status, if patient makes no progress towards goals or if patient is discharged from hospital.  The above assessment, treatment plan, treatment alternatives and goals were discussed and mutually agreed upon: by patient  Ariyonna Twichell 07/27/2022, 11:48 AM

## 2022-07-27 NOTE — Plan of Care (Signed)
  Problem: RH BLADDER ELIMINATION Goal: RH STG MANAGE BLADDER WITH ASSISTANCE Description: STG Manage Bladder With min Assistance Outcome: Not Progressing;incontinence   

## 2022-07-27 NOTE — Group Note (Addendum)
Patient Details Name: Jose Lamb MRN: [id number] DOB: [birth year] Today's Date: 07/27/2022  Pt will  participate in therapeutic dance group >45 mintues.  MET Group Description: Dance Group: Pt participated in dance group with an emphasis on social interaction, motor planning, increasing overall activity tolerance and bimanual tasks. All songs were selected by group members. Dance moves included AROM of BUE/BLE gross motor movements with an emphasis on building functional endurance.    Individual level documentation: Patient completed group from sitting level. Patientt needed supervision to complete various dance moves with cues for instruction/adaptation.  Pain:c/o hip/back pain, relieved with repositioning    Precautions:falls, L hemi, poor balance    Drema Eddington 07/27/2022, 3:56 PM

## 2022-07-27 NOTE — Progress Notes (Addendum)
Occupational Therapy Session Note  Patient Details  Name: Jose Lamb MRN: [id number] Date of Birth: 03-05-1960  Today's Date: 07/27/2022 OT Individual Time: 7564-3329 OT Individual Time Calculation (min): 55 min    Short Term Goals: Week 2:  OT Short Term Goal 1 (Week 2): Pt will complete LB dressing with min A using LRAD OT Short Term Goal 2 (Week 2): Pt will complete toileting tasks with mod A OT Short Term Goal 3 (Week 2): Pt will complete stand pivot transfers with min A  Skilled Therapeutic Interventions/Progress Updates:    Pt received lying in bed supine.  Receptive to OT session, and no c/o pain.  Pt requested to have a shower.  Supine<>sit with (S) and moderate verbal cues for posture and bed mobility.  Stand pivot transfer from bed to wheelchair with Mod A.  Pt did require moderate verbal cues for posture and L lateral lean.  Stand pivot transfer from shower to chair with Mod A.  Pt able to complete self-care at sink sitting in wheelchair with (S).  UB/LB B/D completed with Mod A overall.  Pt left in wheelchair with chair alarm set, call bell within reach and all needs met.  Therapy Documentation Precautions:  Precautions Precautions: Fall Precaution Comments: Lt hemi, poor sitting/standing balance Restrictions Weight Bearing Restrictions: No      Therapy/Group: Individual Therapy  [name] 07/27/2022, 1:30 PM

## 2022-07-27 NOTE — Progress Notes (Signed)
Speech Language Pathology Daily Session Note  Patient Details  Name: Jose Lamb MRN: [id number] Date of Birth: January 22, 1961  Today's Date: 07/27/2022 SLP Individual Time: 0715-0815 SLP Individual Time Calculation (min): 60 min  Short Term Goals: Week 1: SLP Short Term Goal 1 (Week 1): Patient will consume current diet with supervision level verbal cues for use of swallowing compensatory strategies. SLP Short Term Goal 2 (Week 1): Patient will utilize speech intelligibility strategies at the sentence level to achieve ~90% intelligibility with Min verbal cues. SLP Short Term Goal 3 (Week 1): Patient will demonstrate functional problem solving for mildly complex tasks with Min verbal cues.  Skilled Therapeutic Interventions:   Pt seen for skilled ST targeting speech and cognition goals. During structured conversational speech tasks, pt had 90% intelligibility when provided min verbal cues. He was noted to self-correct his rate of speech x3 but continues to require cueing to use overarticulation. When given verbal problem solving tasks, pt had 90% acc for providing a logical solution and sequence of solution. He was able to recall word finding strategies discussed in previous sessions with 80% acc independently; however, no instances of word finding occurred this session. During description tasks, he required min-mod verbal cues for clarification of thought organization.   Pt left in bed with his call bell in reach and bed alarm activated. Continue targeting ST goals; may consider targeting more complex cognitive tasks given pt's progress towards his current goals.    Pain Pain Assessment Pain Scale: 0-10 Pain Score: 0-No pain Faces Pain Scale: No hurt  Therapy/Group: Individual Therapy  Alphonsus Sias 07/27/2022, 10:31 AM

## 2022-07-27 NOTE — Progress Notes (Signed)
Physical Therapy Weekly Progress Note  Patient Details  Name: Jose Lamb MRN: [id number] Date of Birth: 02-17-1960  Beginning of progress report period: July 20, 2022 End of progress report period: July 27, 2022  Today's Date: 07/27/2022 PT Individual Time: 1100-1200 PT Individual Time Calculation (min): 60 min   Patient has met 3 of 4 short term goals. Pt is progressing toward mobility goals, improving independence with bed mobility and transfers. Pt's progress toward ambulation goals has been slower, however, and pt still requires up to maxA with ambulation due to L hemibody motor control impairments and L hemibody fatiguing very quickly. Pt performing bed mobility and transfers and modA level or better, and ambulating up to 15' with maxA and RW. Pt will benefit from hand on family education prior to discharge.   Patient continues to demonstrate the following deficits muscle weakness, decreased cardiorespiratoy endurance, abnormal tone, motor apraxia, decreased coordination, and decreased motor planning, decreased attention to left, decreased awareness and decreased problem solving, and decreased sitting balance, decreased standing balance, decreased postural control, hemiplegia, and decreased balance strategies and therefore will continue to benefit from skilled PT intervention to increase functional independence with mobility.  Patient progressing toward long term goals..  Continue plan of care.  PT Short Term Goals Week 1:  PT Short Term Goal 1 (Week 1): Pt will complete bed mobility consistently with modA. PT Short Term Goal 1 - Progress (Week 1): Met PT Short Term Goal 2 (Week 1): Pt will complete bed to chair transfer consistently with modA. PT Short Term Goal 2 - Progress (Week 1): Met PT Short Term Goal 3 (Week 1): Pt will ambluate x25' with modA and LRAD. PT Short Term Goal 3 - Progress (Week 1): Progressing toward goal PT Short Term Goal 4 (Week 1): Pt will initiate stair  training. PT Short Term Goal 4 - Progress (Week 1): Met Week 2:  PT Short Term Goal 1 (Week 2): Pt will perform bed mobility consistently with minA. PT Short Term Goal 2 (Week 2): Pt will perform bed to chair transfer consistently with minA. PT Short Term Goal 3 (Week 2): Pt will ambulate x25' with modA and LRAD. PT Short Term Goal 4 (Week 2): Pt will complete x3 steps with BHRs and modA.  Skilled Therapeutic Interventions/Progress Updates:  Ambulation/gait training;Community reintegration;DME/adaptive equipment instruction;Neuromuscular re-education;Psychosocial support;Stair training;UE/LE Strength taining/ROM;Wheelchair propulsion/positioning;Balance/vestibular training;Discharge planning;Functional electrical stimulation;Pain management;Skin care/wound management;Therapeutic Activities;UE/LE Coordination activities;Cognitive remediation/compensation;Functional mobility training;Splinting/orthotics;Therapeutic Exercise;Visual/perceptual remediation/compensation;Disease management/prevention;Patient/family education   Pt received seated in WC asleep. Easily awakens and agrees to therapy. Reports some pain in Lt hip. Number not provided. PT provides rest breaks and mobility to manage pain. WC transport to gym for time management. Pt completes sit to stand with RW and heavy modA due to posterior bias and L sided lean. Pt ambulates x15', initially with modA but increasing to maxA due to Lt sided lean, poor attention to Lt side, and PT providing assistance to progress Lt foot during swing phase.   Pt performs NMR for standing balance, performing repeated bouts of standing (including sit to stand) without AD. Mirror provided for visual feedback. Pt performs sit to stand multiple reps with modA and improved center of gravity, after PT provides tactile cueing on forefoot to emphasize weight shift. Pt then stands with modA and PT facilitating Lt knee extension, with verbal and tactile cues for neutral posture.    Pt progresses to completing toe taps on 3" step with RLE. PT provides modA/maxA to stabilize trunk, assist  with maintaining Lt knee extension and Lt lateral weight shifting. Pt completes x3 prior to seated rest break. Pt perform 2 additional bouts of x3 toe taps. PT attempts to use RW for [name] for increased WB and NM feedback, but pt is unable to use effectively.   Stand step transfer from mat>WC>bed with modA/maxA. ModA for management of trunk and lower extremities for sit to supine. Left  semi reclined with alarm intact and all needs within reach.   Therapy Documentation Precautions:  Precautions Precautions: Fall Precaution Comments: Lt hemi, poor sitting/standing balance Restrictions Weight Bearing Restrictions: No   Therapy/Group: Individual Therapy  Beau Fanny, PT, DPT 07/27/2022, 12:04 PM

## 2022-07-28 DIAGNOSIS — A812 Progressive multifocal leukoencephalopathy: Secondary | ICD-10-CM | POA: Diagnosis not present

## 2022-07-28 MED ORDER — ACETAMINOPHEN 500 MG PO TABS
1000.0000 mg | ORAL_TABLET | Freq: Three times a day (TID) | ORAL | Status: DC
  Administered 2022-07-28 – 2022-08-14 (×51): 1000 mg via ORAL
  Filled 2022-07-28 (×51): qty 2

## 2022-07-28 MED ORDER — TRAMADOL HCL 50 MG PO TABS
50.0000 mg | ORAL_TABLET | Freq: Two times a day (BID) | ORAL | Status: DC | PRN
  Administered 2022-07-28 – 2022-07-31 (×5): 50 mg via ORAL
  Filled 2022-07-28 (×5): qty 1

## 2022-07-28 MED ORDER — ESCITALOPRAM OXALATE 10 MG PO TABS
10.0000 mg | ORAL_TABLET | Freq: Every day | ORAL | Status: DC
  Administered 2022-07-29 – 2022-07-31 (×3): 10 mg via ORAL
  Filled 2022-07-28 (×3): qty 1

## 2022-07-28 MED ORDER — VALACYCLOVIR HCL 500 MG PO TABS
1000.0000 mg | ORAL_TABLET | Freq: Two times a day (BID) | ORAL | Status: DC
  Administered 2022-07-28: 1000 mg via ORAL
  Filled 2022-07-28 (×2): qty 2

## 2022-07-28 NOTE — Progress Notes (Signed)
Physical Therapy Session Note  Patient Details  Name: Jose Lamb MRN: [id number] Date of Birth: 10-23-1960  Today's Date: 07/28/2022 PT Individual Time: 1300-1359 PT Individual Time Calculation (min): 59 min   Short Term Goals: Week 2:  PT Short Term Goal 1 (Week 2): Pt will perform bed mobility consistently with minA. PT Short Term Goal 2 (Week 2): Pt will perform bed to chair transfer consistently with minA. PT Short Term Goal 3 (Week 2): Pt will ambulate x25' with modA and LRAD. PT Short Term Goal 4 (Week 2): Pt will complete x3 steps with BHRs and modA.  Skilled Therapeutic Interventions/Progress Updates:     Pt received seated in [facility] and agrees to therapy. Wife present for family education session. Pt reports pain in L buttock and hip. Number not provided. Pt takes pain meds during session to address pain and PT provides repositioning and rest breaks as needed. PT provides update to pt's wife regarding pt's progress with therapy and recommendations for safe mobility following discharge. Wife inquires about power mobility and PT agrees that a power WC may be appropriate to give pt the optimal level of functional independence, secondary due progressive nature of his diagnosis as well as his lack of sensation in L hemibody, making manual WC propulsion very difficulty and hazardous. PT provides pt with trial power WC. Pt performs stand step transfer from manual to power WC with maxA and RW, with pt demonstrating heavy posterior and Lt sided lean, and difficulty sequencing transfer safely and shifting weight laterally. Once in power WC, PT provides education on features and pt is able to manipulate buttons to position himself in a reclined position safe for mobility. Pt then practices driving power WC,with PT cueing for safety and to ensure that pt attends to L side, as pt requires assistance several times to prevent contacting Lt knee against wall or other objects on Lt side. Overall pt requires  minA for safely utilizing power WC. PT adjusts headrest for comfort and optimal body mechanics. PT also provides totalA when maneuvering power WC into position for transfer back to bed. Pt completes slideboard transfer from power WC to bed with modA and cues for sequencing, foot position, body mechanics, and hand placement. Sit to supine with modA/maxA management of bilateral lower extremities. Pt left supine with alarm intact and all needs within reach.   Therapy Documentation Precautions:  Precautions Precautions: Fall Precaution Comments: Lt hemi, poor sitting/standing balance Restrictions Weight Bearing Restrictions: No    Therapy/Group: Individual Therapy  Beau Fanny, PT, DPT 07/28/2022, 4:51 PM

## 2022-07-28 NOTE — Progress Notes (Signed)
PROGRESS NOTE   Subjective/Complaints: No events overnight.  This morning, patient complains of pain in his right groin, later complains of pain in his left groin radiating into his buttocks with nursing.  Does feel tightness and spasticity in left lower extremity is improving.  No other concerns.  Patient's wife at bedside, had long discussion with her regarding patient's prognosis with PML and likely need for increased and ongoing support at home.  Patient's wife is working on getting the patient Medicaid for home aide services and inquiring as to powered mobility, as she has history of cancer in her back and is very limited and her ability to assist him.  She is motivated to keep the patient at home if at all possible.  Finally, nursing notes increasing rash in the patient's groin from prior penile wounds  ROS:  + LLE weakness/spasticity -improved + Balance issues - ongoing + Constipation - resolved + Groin rash/wounds + Bilateral groin pain  Denies fevers, chills, N/V, abdominal pain, diarrhea, SOB, cough, chest pain, new weakness or paraesthesias.    Objective:   No results found. No results for input(s): "WBC", "HGB", "HCT", "PLT" in the last 72 hours.  No results for input(s): "NA", "K", "CL", "CO2", "GLUCOSE", "BUN", "CREATININE", "CALCIUM" in the last 72 hours.   Intake/Output Summary (Last 24 hours) at 07/28/2022 0859 Last data filed at 07/28/2022 8657 Gross per 24 hour  Intake 472 ml  Output 400 ml  Net 72 ml      Pressure Injury 07/13/22 Sacrum Medial Stage 2 -  Partial thickness loss of dermis presenting as a shallow open injury with a red, pink wound bed without slough. Skin Tear Intergluteal Cleft (Active)  07/13/22 1130  Location: Sacrum  Location Orientation: Medial  Staging: Stage 2 -  Partial thickness loss of dermis presenting as a shallow open injury with a red, pink wound bed without slough.  Wound  Description (Comments): Skin Tear Intergluteal Cleft  Present on Admission: Yes    Physical Exam: Vital Signs Blood pressure 124/61, pulse (!) 51, temperature 97.9 F (36.6 C), temperature source Oral, resp. rate 18, height 5\' 11"  (1.803 m), weight 92.1 kg, SpO2 97 %.  Constitutional: No apparent distress. Appropriate appearance for age.  Sitting in wheelchair at bedside, with wife. Eyes: PERRLA. EOMI. Visual fields grossly intact.  Cardiovascular: RRR, no murmurs/rub/gallops. No Edema.  Respiratory: CTAB. No rales, rhonchi, or wheezing. On RA.  Abdomen: + bowel sounds, normoactive. No distention or tenderness.  Skin: C/D/I. No apparent lesions. + [name] IV, c/d/I.  + Stage 2 to sacrum - from 6/7 admission + Skin tears to penis -expanding into groin rash  MSK:      No apparent deformity.      Strength: 5/5 BL UE RLE 4+/5,  LLE 3/5 HF, 4-/5 KE, 4-/5 DF, PF   MAS 1 hip flexors, MAS 0 knee extensors; MAS 0 PF-unchanged  Neurologic exam:  Cognition: Mild lethargy, however easily arousable and AAO to person, place, time and event.  + Delayed recall + Delayed motor initiation + Mild cognitive deficit  Language: Fluent, No substitutions or neoglisms. Mild dysarthria.  Insight: Good  insight into current condition.  Mood: Flat affect.  Sensation: To light touch intact b/l. CN: 2-12 grossly intact.        Assessment/Plan: 1. Functional deficits which require 3+ hours per day of interdisciplinary therapy in a comprehensive inpatient rehab setting. Physiatrist is providing close team supervision and 24 hour management of active medical problems listed below. Physiatrist and rehab team continue to assess barriers to discharge/monitor patient progress toward functional and medical goals  Care Tool:  Bathing    Body parts bathed by patient: Right arm, Left arm, Chest, Abdomen, Front perineal area, Face   Body parts bathed by helper: Buttocks, Right upper leg, Left upper leg, Right  lower leg, Left lower leg     Bathing assist Assist Level: Moderate Assistance - Patient 50 - 74%     Upper Body Dressing/Undressing Upper body dressing   What is the patient wearing?: Pull over shirt    Upper body assist Assist Level: Moderate Assistance - Patient 50 - 74%    Lower Body Dressing/Undressing Lower body dressing      What is the patient wearing?: Pants     Lower body assist Assist for lower body dressing: Dependent - Patient 0%     Toileting Toileting    Toileting assist Assist for toileting: Maximal Assistance - Patient 25 - 49%     Transfers Chair/bed transfer  Transfers assist  Chair/bed transfer activity did not occur: Safety/medical concerns  Chair/bed transfer assist level: Maximal Assistance - Patient 25 - 49%     Locomotion Ambulation   Ambulation assist      Assist level: 2 helpers Assistive device: Walker-rolling Max distance: 12'   Walk 10 feet activity   Assist     Assist level: 2 helpers Assistive device: Walker-rolling   Walk 50 feet activity   Assist Walk 50 feet with 2 turns activity did not occur: Safety/medical concerns         Walk 150 feet activity   Assist Walk 150 feet activity did not occur: Safety/medical concerns         Walk 10 feet on uneven surface  activity   Assist Walk 10 feet on uneven surfaces activity did not occur: Safety/medical concerns         Wheelchair     Assist Is the patient using a wheelchair?: Yes Type of Wheelchair: Manual    Wheelchair assist level: Minimal Assistance - Patient > 75% Max wheelchair distance: 89'    Wheelchair 50 feet with 2 turns activity    Assist        Assist Level: Minimal Assistance - Patient > 75%   Wheelchair 150 feet activity     Assist      Assist Level: Maximal Assistance - Patient 25 - 49%   Blood pressure 124/61, pulse (!) 51, temperature 97.9 F (36.6 C), temperature source Oral, resp. rate 18, height 5'  11" (1.803 m), weight 92.1 kg, SpO2 97 %.  Medical Problem List and Plan: 1. 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 Pt with subsequent left greater than right weakness and sensory loss particularly involving the lower extremities.              -patient may shower             -ELOS/Goals: 12-16 days  - 08/09/22  DC date             - Continue CIR PT/OT/SLP   - Per family  request, HIV/AIDs diagnosis to remain confidential between wife and patient; do not inform other family  - 6/18: Per wife needs intermittent SPV goals, not realistic given therapies. He does well with cueing. Max A with sitting balance, transfers, and standing. Requires a lot skilled, verbal, and tactile cues for transferring due to no carryover during and between sessions. LLE shooting out. Burden of care remains high; will work more on STS and transfers than higher-level dynamics. Per SLP on full diet working on speech intelligibility, will do memory eval.  - 6/21: Updated wife on functional prognosis with PML, enforced need for ongoing support at home and anticipate some ongoing functional decline.  Must stay compliant with antiretroviral therapy.  Will discuss with team possibility of powered mobility, given her own functional deficits.  2.  Antithrombotics: -DVT/anticoagulation:  Pharmaceutical: Eliquis             -antiplatelet therapy: none   3. 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  Scheduled Tylenol 1000 mg 3 times daily, add tramadol 50 mg twice daily as needed.   4. Mood/Behavior/Sleep: LCSW to evaluate and provide emotional support             -antipsychotic agents: n/a   5. Neuropsych/cognition: This patient is capable of making decisions on his own behalf.   - SLP eval   - Neuropsych eval  6/18: While the patient clearly had some cognitive issues most of these were noted having to do with patterns consistent with midbrain and subcortical dysfunction and dysregulation. Patient denied significant depression or anxiety and denied significant coping difficulties with his current hospitalization although he is anxious about how much she will be able to improve even with therapies.    - 6/21: Per wife, increasing lethargy, depression, and mood lability at home prior to admission.  Possibly signs of depression secondary to cognitive injury and functional decline.  Initiate activating SSRI Lexapro 10 mg daily.   6. Skin/Wound Care: Routine skin care checks  - Sacral stage 2 - offloading with foam and bed mobility  - Penile wounds - keep dry and monitor   7. Fluids/Electrolytes/Nutrition: Routine Is and Os and follow-up chemistries             -carb modified diet   - 6/14: Eating 25-100% meals. SLP dysphagia screen d/t mild dysarthria today.  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 fib: continue Eliquis for PE  -HR controlled   15: DM-2; A1c = 6.6% on 04/10/2022; (no home meds listed)             -serum glucose range: 89-119             -continue carb modified diet  - No CBGs, monitor BID  AC for now  - 6/16-17 very well-controlled; can consider DC if remain low with adequate PO   - 6/18: well controlled, POs 25-75%, DC CBG 6/19   Recent Labs    07/25/22 1651 07/26/22 0632  GLUCAP  107* 115*      16: Acute PE and right lower extremity DVT             -continue Eliquis -> 10 to 5 mg BID dosing 6/17   17: Obesity: BMI 32    18: Chest pain 6/11>>negative trop x 2; EKG non-acute             -attributed to PE versus musculoskeletal   - 6/14: Improved with lidoderm patch. Encourage incentive spirometry.  -6/16 reports this is improved today.   19. Constipation. Schedule Sennakot-S 1 tab QHS and add PRN Miralax daily 17 g.   -6/15 reports improved with BM yesterday   -6/16 denies feeling constipated   - LBM 6/20, large after at bedtime suppository; add Miralax 17g BID  20. Spasticity. Extensor tone in [name] and LLE per therapies. - 6/18:  added Baclofen 5 mg TID. Consider PRAFO.  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  Initiate treatment with valacyclovir 1000 mg twice daily for 14 days.  LOS: 9 days A FACE TO FACE EVALUATION WAS PERFORMED  Angelina Sheriff 07/28/2022, 8:59 AM

## 2022-07-28 NOTE — Progress Notes (Signed)
Physical Therapy Session Note  Patient Details  Name: Jose Lamb MRN: [id number] Date of Birth: [birth year]  Today's Date: 07/28/2022 PT Individual Time: 4259-5638 PT Individual Time Calculation (min): 50 min   Short Term Goals: Week 2:  PT Short Term Goal 1 (Week 2): Pt will perform bed mobility consistently with minA. PT Short Term Goal 2 (Week 2): Pt will perform bed to chair transfer consistently with minA. PT Short Term Goal 3 (Week 2): Pt will ambulate x25' with modA and LRAD. PT Short Term Goal 4 (Week 2): Pt will complete x3 steps with BHRs and modA.  Skilled Therapeutic Interventions/Progress Updates:    Pt presents up in w/c and wife on phone stating she was running late for the family education scheduled. Gave her the rest of the schedule as well for today as pt has OT and then PT again later for her to observe. She did arrive for the later part of session and was able to observe 1 transfer and be part of discussion about potential recommendation for custom consult for seating system (power vs manual). Wife reports physically being limited with ability to provide physical assistance so focused session on NMR and assessment of transfers .   Discussed with patient and then introduced slide board transfers as alternate method that may be more successful for him and his wife to manage at home. Initially attempted transfer to the R with slideboard, total assist for lateral lean and for placement of board - difficult for pt to follow commands to lean far enough and increased pain in L hip also limited pt's ability to lean fully. Attempted to initiate transfer with multimodal cues but pt unable to motor plan and sequence successfully and pain really limiting ability to lean anterior and L for head/hips relationship. Transitioned to attempt to go to the L instead (total assist to reposition back in w/c and turn around). Improved R lateral lean for board placement (suggest different cushion for  w/c as well to help with tolerance (pressure relieving for his sacrum) and easier placement of slideboard (this cover is tacky so difficult to slide under him). Hand over back technique, required min assist once board was placed for scooting to the L - pt impulsive at times and poor awareness of L hemibody requiring verbal and tacilte cues and management for safe placement. Repeated transfer to L and R x 3 reps total with min to mod assist and heavy cueing. Rest breaks in seated position edge of mat with focus on NMR for midline reorientation, upright posture and trunk extension and maintaining overall balance. Discussed custom w/c for improved sitting tolerance/energy expenditure, better positioning and alignment of trunk, as well as increased independence with mobility and position changes for tolerance and pain management. Pt and wife in agreement - wife asking about power options as well. Will discuss with primary PT and recommend trial in our power w/c on unit as well.   Therapy Documentation Precautions:  Precautions Precautions: Fall Precaution Comments: Lt hemi, poor sitting/standing balance Restrictions Weight Bearing Restrictions: No   Pain: Reports pain in L hip - 10/10 pain - RN made aware and medication given during session. Repositioned as able.    Therapy/Group: Individual Therapy  Karolee Stamps Darrol Poke, PT, DPT, CBIS  07/28/2022, 11:14 AM

## 2022-07-28 NOTE — Progress Notes (Addendum)
Penile wounds are healed, no open areas noted. Uploaded current photos to media. Informed MD.

## 2022-07-28 NOTE — Progress Notes (Signed)
Speech Language Pathology Weekly Progress Note  Patient Details  Name: Jose Lamb MRN: [id number] Date of Birth: [birth year]  Beginning of progress report period: July 21, 2022 End of progress report period: July 28, 2022  Short Term Goals: Week 1: SLP Short Term Goal 1 (Week 1): Patient will consume current diet with supervision level verbal cues for use of swallowing compensatory strategies. SLP Short Term Goal 1 - Progress (Week 1): Met SLP Short Term Goal 2 (Week 1): Patient will utilize speech intelligibility strategies at the sentence level to achieve ~90% intelligibility with Min verbal cues. SLP Short Term Goal 2 - Progress (Week 1): Met SLP Short Term Goal 3 (Week 1): Patient will demonstrate functional problem solving for mildly complex tasks with Min verbal cues. SLP Short Term Goal 3 - Progress (Week 1): Met    New Short Term Goals: Week 2: SLP Short Term Goal 1 (Week 2): Patient will consume current diet with Mod I for use of swallowing compensatory strategies. SLP Short Term Goal 2 (Week 2): Patient will consume trials of thin liquids via STRAW without overt s/s of aspiration over 2 sessions prior to upgrade. SLP Short Term Goal 3 (Week 2): Patient will utilize speech intelligibility strategies at the sentence level to achieve ~90% intelligibility with supervision verbal cues. SLP Short Term Goal 4 (Week 2): Patient will demonstrate functional problem solving for mildly complex tasks with supervision verbal cues.  Weekly Progress Updates: Patient has made functional gains and has met 3 of 3 STGs this reporting period. Currently, patient is consuming regular textures with thin liquids via cup with minimal overt s/s of aspiration and overall supervision level verbal cues for use of swallowing compensatory strategies. Trials of thin liquids via straw will be a focus during this upcoming reporting period. Patient demonstrates improved speech intelligibility and is ~90%  intelligible at the sentence level with overall Min verbal cues for use of speech intelligibility strategies.  Min verbal cues are also required for problem solving with mildly complex tasks. Patient and family education ongoing. Patient would benefit from continued skilled SLP intervention to maximize his swallowing and cognitive functioning as well as his speech intelligibility prior to discharge.      Intensity: Minumum of 1-2 x/day, 30 to 90 minutes Frequency: 1 to 3 out of 7 days Duration/Length of Stay: 08/09/22 Treatment/Interventions: Cognitive remediation/compensation;Cueing hierarchy;Dysphagia/aspiration precaution training;Environmental controls;Functional tasks;Internal/external aids;Patient/family education;Speech/Language facilitation;Therapeutic Activities     Janaiya Beauchesne 07/28/2022, 6:33 AM

## 2022-07-28 NOTE — Progress Notes (Signed)
Occupational Therapy Session Note  Patient Details  Name: Jose Lamb MRN: [id number] Date of Birth: [birth year]  Session 1: Today's Date: 07/28/2022 OT Individual Time: 6295-2841 OT Individual Time Calculation (min): 57 min   Session 2: Today's Date: 07/28/2022 OT Individual Time: 3244-0102 OT Individual Time Calculation (min): 46 min   Short Term Goals: Week 2:  OT Short Term Goal 1 (Week 2): Pt will complete LB dressing with min A using LRAD OT Short Term Goal 2 (Week 2): Pt will complete toileting tasks with mod A OT Short Term Goal 3 (Week 2): Pt will complete stand pivot transfers with min A  Skilled Therapeutic Interventions/Progress Updates:    Session 1: Patient agreeable to participate in OT session. Reports 0/10 pain level.   Patient participated in skilled OT session focusing on functional transfers, left side NM re-ed, ADL re-training, safety awareness and judgement.   Bed mobility: supine to sit, with Min guard, HOB elevated using bed rails to assist while transitioning to right side of bed.  Max Assist provided to scoot hips towards EOB while sitting.  Sqt pvt transfer completed towards right side with NDT to left knee for safety. Pt VC for safety and sequencing.  LB dressing: with use of hemi dressing technique and OT providing stability of LLE when crossed over right, pt was able to don pants, socks, and shoes with min A. Required total assist to tie shoes. Max VC and with visual demonstration provided for sequencing and technique. Required VC to adhere to midline orientation and increase awareness to left lateral lean while seated.  Grooming: brushed teeth while seated at sink with set-up Therapist educated pt on hemi dressing techniques during LB dressing in order to improve functional performance and increased participation basic self care tasks when discharged home.  In main gym, pt complete squat pvt transfer towards right side with Mod A. OT provided verbal  instructions and visual demonstration to lean trunk forward to off load buttocks then pivot hips towards the right to sit on mat table. Max A and max VC needed to scoot left hip back onto mat table. Focused on midline orientation while using large wall mirror for visual feedback. SBA needed 90% while occasionally requiring min guard assist to maintain balance. VC needed 50% of the time to bring awareness to sitting balance and self correct. Dual task completed while focusing on seated balance as OT and pt discussed home set-up and DME needs. More difficulty noted with maintaining midline awareness during dual task completion. Completed sqt pvt transfer towards left side from mat table to [facility] with Mod A and VC for technique and sequencing prior to completing.   Session 2: No report of pain during session. Completed family training with patient and wife, [name]. Therapist educated family regarding left side awareness, DME needs, . Education also provided on strategies and compensatory techniques to use if pt demonstrates decreased awareness to his left side, safety awareness and strategies for SB transfers and activity modifications such as use of transport chair versus manual wheelchair to allow for pt to enter primary bathroom to shower. Recommended completing "Dry run" of shower, remove shower doors and place shower curtain, may need to remove bathroom door and place curtain to reach tub/shower. Education provided for gait belt use and handling technique. Visual demonstration provided alone with verbal instruction during tub/shower transfer using SB with a TTB. All education completed and all questions. Pt continues to require max VC for sequencing and safety/judgement during transfers and for  maintaining awareness to left arm. Would benefit from continued family training sessions with wife prior to discharge.      Therapy Documentation Precautions:  Precautions Precautions: Fall Precaution Comments: Lt  hemi, poor sitting/standing balance Restrictions Weight Bearing Restrictions: No  Therapy/Group: Individual Therapy  Limmie Patricia, OTR/L,CBIS  Supplemental OT - [facility] and [facility] Secure Chat Preferred   07/28/2022, 7:58 AM

## 2022-07-28 NOTE — Progress Notes (Addendum)
Patient ID: Jose Lamb, male   DOB: [birth year], 62 y.o.   MRN: [id number]  [id number] spoke with pt wife to discuss family edu. She will be in today at 10am. SW discussed [facility] preference. No preference.   SW sent HHPT/OT/SLP referral to [name]. *referral declined.  SW sent out [facility] referral to [facility] and waiting on follow-up.  *referral accepted*  Cecile Sheerer, MSW, LCSWA Office: [phone] Cell: [phone] Fax: [phone]

## 2022-07-29 DIAGNOSIS — A812 Progressive multifocal leukoencephalopathy: Secondary | ICD-10-CM | POA: Diagnosis not present

## 2022-07-29 MED ORDER — CARBAMIDE PEROXIDE 6.5 % OT SOLN
5.0000 [drp] | Freq: Two times a day (BID) | OTIC | Status: DC
  Administered 2022-07-29 – 2022-07-30 (×3): 5 [drp] via OTIC
  Filled 2022-07-29: qty 15

## 2022-07-29 MED ORDER — SCOPOLAMINE 1 MG/3DAYS TD PT72
1.0000 | MEDICATED_PATCH | TRANSDERMAL | Status: DC
  Administered 2022-07-29: 1.5 mg via TRANSDERMAL
  Filled 2022-07-29: qty 1

## 2022-07-29 NOTE — Progress Notes (Signed)
Patient had an emesis occurrence - stated it was sudden and has not happened before.  Patient also c/o of decreased hearing on the right ear since last night.   Denies of ay further nausea/pain/dizziness.  Seen by MD.

## 2022-07-29 NOTE — Progress Notes (Signed)
PROGRESS NOTE   Subjective/Complaints: C/o nausea with committing this morning. Improved after starting scopolamine patch Also notes decreased hearing in right ear with tinnitus. Debrox ear drops ordered and asked nursing to inform me if this fails to improve  ROS:  + LLE weakness/spasticity -improved + Balance issues - ongoing + Constipation - resolved + Groin rash/wounds + Bilateral groin pain +emesis  Denies fevers, chills, N/V, abdominal pain, diarrhea, SOB, cough, chest pain, new weakness or paraesthesias.    Objective:   No results found. No results for input(s): "WBC", "HGB", "HCT", "PLT" in the last 72 hours.  No results for input(s): "NA", "K", "CL", "CO2", "GLUCOSE", "BUN", "CREATININE", "CALCIUM" in the last 72 hours.   Intake/Output Summary (Last 24 hours) at 07/29/2022 1912 Last data filed at 07/29/2022 1700 Gross per 24 hour  Intake 720 ml  Output 401 ml  Net 319 ml     Pressure Injury 07/13/22 Sacrum Medial Stage 2 -  Partial thickness loss of dermis presenting as a shallow open injury with a red, pink wound bed without slough. Skin Tear Intergluteal Cleft (Active)  07/13/22 1130  Location: Sacrum  Location Orientation: Medial  Staging: Stage 2 -  Partial thickness loss of dermis presenting as a shallow open injury with a red, pink wound bed without slough.  Wound Description (Comments): Skin Tear Intergluteal Cleft  Present on Admission: Yes    Physical Exam: Vital Signs Blood pressure 137/88, pulse 80, temperature 98.6 F (37 C), resp. rate 18, height 5\' 11"  (1.803 m), weight 92.1 kg, SpO2 95 %. Gen: no distress, normal appearing HEENT: oral mucosa pink and moist, NCAT Cardio: Reg rate Chest: normal effort, normal rate of breathing Abd: soft, non-distended Ext: no edema  Skin: C/D/I. No apparent lesions. + [name] IV, c/d/I.  + Stage 2 to sacrum - from 6/7 admission + Skin tears to penis  -expanding into groin rash  MSK:      No apparent deformity.      Strength: 5/5 BL UE RLE 4+/5,  LLE 3/5 HF, 4-/5 KE, 4-/5 DF, PF   MAS 1 hip flexors, MAS 0 knee extensors; MAS 0 PF-unchanged  Neurologic exam:  Cognition: Mild lethargy, however easily arousable and AAO to person, place, time and event.  + Delayed recall + Delayed motor initiation + Mild cognitive deficit  Language: Fluent, No substitutions or neoglisms. Mild dysarthria.  Insight: Good  insight into current condition.  Mood: Flat affect.  Sensation: To light touch intact b/l. CN: 2-12 grossly intact.        Assessment/Plan: 1. Functional deficits which require 3+ hours per day of interdisciplinary therapy in a comprehensive inpatient rehab setting. Physiatrist is providing close team supervision and 24 hour management of active medical problems listed below. Physiatrist and rehab team continue to assess barriers to discharge/monitor patient progress toward functional and medical goals  Care Tool:  Bathing    Body parts bathed by patient: Right arm, Left arm, Chest, Abdomen, Front perineal area, Face   Body parts bathed by helper: Buttocks, Right upper leg, Left upper leg, Right lower leg, Left lower leg     Bathing assist Assist Level: Moderate Assistance - Patient  50 - 74%     Upper Body Dressing/Undressing Upper body dressing   What is the patient wearing?: Pull over shirt    Upper body assist Assist Level: Moderate Assistance - Patient 50 - 74%    Lower Body Dressing/Undressing Lower body dressing      What is the patient wearing?: Pants     Lower body assist Assist for lower body dressing: Dependent - Patient 0%     Toileting Toileting    Toileting assist Assist for toileting: Maximal Assistance - Patient 25 - 49%     Transfers Chair/bed transfer  Transfers assist  Chair/bed transfer activity did not occur: Safety/medical concerns  Chair/bed transfer assist level: Maximal  Assistance - Patient 25 - 49%     Locomotion Ambulation   Ambulation assist      Assist level: 2 helpers Assistive device: Walker-rolling Max distance: 12'   Walk 10 feet activity   Assist     Assist level: 2 helpers Assistive device: Walker-rolling   Walk 50 feet activity   Assist Walk 50 feet with 2 turns activity did not occur: Safety/medical concerns         Walk 150 feet activity   Assist Walk 150 feet activity did not occur: Safety/medical concerns         Walk 10 feet on uneven surface  activity   Assist Walk 10 feet on uneven surfaces activity did not occur: Safety/medical concerns         Wheelchair     Assist Is the patient using a wheelchair?: Yes Type of Wheelchair: Manual    Wheelchair assist level: Minimal Assistance - Patient > 75% Max wheelchair distance: 66'    Wheelchair 50 feet with 2 turns activity    Assist        Assist Level: Minimal Assistance - Patient > 75%   Wheelchair 150 feet activity     Assist      Assist Level: Maximal Assistance - Patient 25 - 49%   Blood pressure 137/88, pulse 80, temperature 98.6 F (37 C), resp. rate 18, height 5\' 11"  (1.803 m), weight 92.1 kg, SpO2 95 %.  Medical Problem List and Plan: 1. 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 Pt with subsequent left greater than right weakness and sensory loss particularly involving the lower extremities.              -patient may shower             -ELOS/Goals: 12-16 days  - 08/09/22  DC date             - Continue CIR PT/OT/SLP   - Per family request, HIV/AIDs diagnosis to remain confidential between wife and patient; do not inform other family  - 6/18: Per wife needs intermittent SPV goals, not realistic given therapies. He does well with cueing. Max A with sitting balance, transfers, and standing. Requires a lot skilled, verbal, and  tactile cues for transferring due to no carryover during and between sessions. LLE shooting out. Burden of care remains high; will work more on STS and transfers than higher-level dynamics. Per SLP on full diet working on speech intelligibility, will do memory eval.  - 6/21: Updated wife on functional prognosis with PML, enforced need for ongoing support at home and anticipate some ongoing functional decline.  Must stay compliant with antiretroviral therapy.  Will discuss with team possibility of powered mobility, given her own  functional deficits.  2.  Antithrombotics: -DVT/anticoagulation:  Pharmaceutical: Eliquis             -antiplatelet therapy: none   3. 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  Scheduled Tylenol 1000 mg 3 times daily, add tramadol 50 mg twice daily as needed.   4. Mood/Behavior/Sleep: LCSW to evaluate and provide emotional support             -antipsychotic agents: n/a   5. Neuropsych/cognition: This patient is capable of making decisions on his own behalf.   - SLP eval   - Neuropsych eval 6/18: While the patient clearly had some cognitive issues most of these were noted having to do with patterns consistent with midbrain and subcortical dysfunction and dysregulation. Patient denied significant depression or anxiety and denied significant coping difficulties with his current hospitalization although he is anxious about how much she will be able to improve even with therapies.    - 6/21: Per wife, increasing lethargy, depression, and mood lability at home prior to admission.  Possibly signs of depression secondary to cognitive injury and functional decline.  Initiate activating SSRI Lexapro 10 mg daily.   6. Skin/Wound Care: Routine skin care checks  - Sacral stage 2 - offloading with foam and bed mobility  - Penile wounds - keep dry and monitor    7. Fluids/Electrolytes/Nutrition: Routine Is and Os and follow-up chemistries             -carb modified diet   - 6/14: Eating 25-100% meals. SLP dysphagia screen d/t mild dysarthria today.  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 fib: continue Eliquis for PE  -HR controlled   15: DM-2; A1c = 6.6% on 04/10/2022; (no home meds listed)             -serum glucose range: 89-119             -continue carb modified diet  - No CBGs, monitor BID AC for now  - 6/16-17 very well-controlled; can consider DC if remain low with adequate PO   - 6/18: well controlled, POs 25-75%, DC CBG 6/19  No results for input(s): "GLUCAP" in the last 72 hours.   16: Acute PE and right lower extremity DVT             -continue Eliquis -> 10 to 5 mg BID dosing 6/17   17: Obesity: BMI 32    18: Chest pain 6/11>>negative trop x 2; EKG non-acute             -attributed to PE versus musculoskeletal   - 6/14: Improved with lidoderm patch. Encourage incentive spirometry.  -6/16 reports this is improved today.   19. Constipation. Schedule Sennakot-S 1 tab  QHS and add PRN Miralax daily 17 g.   -6/15 reports improved with BM yesterday   -6/16 denies feeling constipated   - LBM 6/20, large after at bedtime suppository; add Miralax 17g BID  20. Spasticity. Extensor tone in [name] and LLE per therapies. - 6/18:  added Baclofen 5 mg TID. Consider PRAFO.  - 6/19: Benefit with ongoing mild spasticity, increased to 10 mg  twice daily - Continue 10 mg TID  21: Penile wounds, suspected HSV.  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  Continue treatment with valacyclovir 1000 mg twice daily for 14 days.  22. Emesis: scopolamine patch ordered  23. Right ear decreased hearing: debrox ear drops ordered and asked nurse to inform me if healing fails to improve  LOS: 10 days A FACE TO FACE EVALUATION WAS PERFORMED  Drema Pry Mattison Golay 07/29/2022, 7:12 PM

## 2022-07-30 DIAGNOSIS — A812 Progressive multifocal leukoencephalopathy: Secondary | ICD-10-CM | POA: Diagnosis not present

## 2022-07-30 NOTE — Progress Notes (Signed)
PROGRESS NOTE   Subjective/Complaints: No new complaints this morning Nausea and hearing have improved Patient's chart reviewed- No issues reported overnight Vitals signs stable except for elevated SBP  ROS:  + LLE weakness/spasticity -improved + Balance issues - ongoing + Constipation - resolved + Groin rash/wounds + Bilateral groin pain +emesis- improved  Denies fevers, chills, N/V, abdominal pain, diarrhea, SOB, cough, chest pain, new weakness or paraesthesias.    Objective:   No results found. No results for input(s): "WBC", "HGB", "HCT", "PLT" in the last 72 hours.  No results for input(s): "NA", "K", "CL", "CO2", "GLUCOSE", "BUN", "CREATININE", "CALCIUM" in the last 72 hours.   Intake/Output Summary (Last 24 hours) at 07/30/2022 1851 Last data filed at 07/30/2022 1814 Gross per 24 hour  Intake 600 ml  Output 450 ml  Net 150 ml     Pressure Injury 07/13/22 Sacrum Medial Stage 2 -  Partial thickness loss of dermis presenting as a shallow open injury with a red, pink wound bed without slough. Skin Tear Intergluteal Cleft (Active)  07/13/22 1130  Location: Sacrum  Location Orientation: Medial  Staging: Stage 2 -  Partial thickness loss of dermis presenting as a shallow open injury with a red, pink wound bed without slough.  Wound Description (Comments): Skin Tear Intergluteal Cleft  Present on Admission: Yes    Physical Exam: Vital Signs Blood pressure (!) 149/61, pulse 74, temperature 98.8 F (37.1 C), temperature source Oral, resp. rate 19, height 5\' 11"  (1.803 m), weight 92.1 kg, SpO2 95 %. Gen: no distress, normal appearing HEENT: oral mucosa pink and moist, NCAT, right ear hearing improved Cardio: Reg rate Chest: normal effort, normal rate of breathing Abd: soft, non-distended Ext: no edema  Skin: C/D/I. No apparent lesions. + [name] IV, c/d/I.  + Stage 2 to sacrum - from 6/7 admission + Skin tears to  penis -expanding into groin rash  MSK:      No apparent deformity.      Strength: 5/5 BL UE RLE 4+/5,  LLE 3/5 HF, 4-/5 KE, 4-/5 DF, PF   MAS 1 hip flexors, MAS 0 knee extensors; MAS 0 PF-unchanged  Neurologic exam:  Cognition: Mild lethargy, however easily arousable and AAO to person, place, time and event.  + Delayed recall + Delayed motor initiation + Mild cognitive deficit  Language: Fluent, No substitutions or neoglisms. Mild dysarthria.  Insight: Good  insight into current condition.  Mood: Flat affect.  Sensation: To light touch intact b/l. CN: 2-12 grossly intact.        Assessment/Plan: 1. Functional deficits which require 3+ hours per day of interdisciplinary therapy in a comprehensive inpatient rehab setting. Physiatrist is providing close team supervision and 24 hour management of active medical problems listed below. Physiatrist and rehab team continue to assess barriers to discharge/monitor patient progress toward functional and medical goals  Care Tool:  Bathing    Body parts bathed by patient: Right arm, Left arm, Chest, Abdomen, Front perineal area, Face   Body parts bathed by helper: Buttocks, Right upper leg, Left upper leg, Right lower leg, Left lower leg     Bathing assist Assist Level: Moderate Assistance - Patient 50 -  74%     Upper Body Dressing/Undressing Upper body dressing   What is the patient wearing?: Pull over shirt    Upper body assist Assist Level: Moderate Assistance - Patient 50 - 74%    Lower Body Dressing/Undressing Lower body dressing      What is the patient wearing?: Pants     Lower body assist Assist for lower body dressing: Dependent - Patient 0%     Toileting Toileting    Toileting assist Assist for toileting: Maximal Assistance - Patient 25 - 49%     Transfers Chair/bed transfer  Transfers assist  Chair/bed transfer activity did not occur: Safety/medical concerns  Chair/bed transfer assist level:  Maximal Assistance - Patient 25 - 49%     Locomotion Ambulation   Ambulation assist      Assist level: 2 helpers Assistive device: Walker-rolling Max distance: 12'   Walk 10 feet activity   Assist     Assist level: 2 helpers Assistive device: Walker-rolling   Walk 50 feet activity   Assist Walk 50 feet with 2 turns activity did not occur: Safety/medical concerns         Walk 150 feet activity   Assist Walk 150 feet activity did not occur: Safety/medical concerns         Walk 10 feet on uneven surface  activity   Assist Walk 10 feet on uneven surfaces activity did not occur: Safety/medical concerns         Wheelchair     Assist Is the patient using a wheelchair?: Yes Type of Wheelchair: Manual    Wheelchair assist level: Minimal Assistance - Patient > 75% Max wheelchair distance: 71'    Wheelchair 50 feet with 2 turns activity    Assist        Assist Level: Minimal Assistance - Patient > 75%   Wheelchair 150 feet activity     Assist      Assist Level: Maximal Assistance - Patient 25 - 49%   Blood pressure (!) 149/61, pulse 74, temperature 98.8 F (37.1 C), temperature source Oral, resp. rate 19, height 5\' 11"  (1.803 m), weight 92.1 kg, SpO2 95 %.  Medical Problem List and Plan: 1. 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 Pt with subsequent left greater than right weakness and sensory loss particularly involving the lower extremities.              -patient may shower             -ELOS/Goals: 12-16 days  - 08/09/22  DC date             - Continue CIR PT/OT/SLP   - Per family request, HIV/AIDs diagnosis to remain confidential between wife and patient; do not inform other family  - 6/18: Per wife needs intermittent SPV goals, not realistic given therapies. He does well with cueing. Max A with sitting balance, transfers, and standing.  Requires a lot skilled, verbal, and tactile cues for transferring due to no carryover during and between sessions. LLE shooting out. Burden of care remains high; will work more on STS and transfers than higher-level dynamics. Per SLP on full diet working on speech intelligibility, will do memory eval.  - 6/21: Updated wife on functional prognosis with PML, enforced need for ongoing support at home and anticipate some ongoing functional decline.  Must stay compliant with antiretroviral therapy.  Will discuss with team possibility of powered mobility, given  her own functional deficits.  2.  Antithrombotics: -DVT/anticoagulation:  Pharmaceutical: Eliquis             -antiplatelet therapy: none   3. 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  Scheduled Tylenol 1000 mg 3 times daily, add tramadol 50 mg twice daily as needed.   4. Mood/Behavior/Sleep: LCSW to evaluate and provide emotional support             -antipsychotic agents: n/a   5. Neuropsych/cognition: This patient is capable of making decisions on his own behalf.   - SLP eval   - Neuropsych eval 6/18: While the patient clearly had some cognitive issues most of these were noted having to do with patterns consistent with midbrain and subcortical dysfunction and dysregulation. Patient denied significant depression or anxiety and denied significant coping difficulties with his current hospitalization although he is anxious about how much she will be able to improve even with therapies.    - 6/21: Per wife, increasing lethargy, depression, and mood lability at home prior to admission.  Possibly signs of depression secondary to cognitive injury and functional decline.  Initiate activating SSRI Lexapro 10 mg daily.   6. Skin/Wound Care: Routine skin care checks  - Sacral stage 2 - offloading with foam and bed mobility  -  Penile wounds - keep dry and monitor   7. Fluids/Electrolytes/Nutrition: Routine Is and Os and follow-up chemistries             -carb modified diet   - 6/14: Eating 25-100% meals. SLP dysphagia screen d/t mild dysarthria today.  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 fib: continue Eliquis for PE  -HR controlled   15: DM-2; A1c = 6.6% on 04/10/2022; (no home meds listed)             -serum glucose range: 89-119             -continue carb modified diet  - No CBGs, monitor BID AC for now  - 6/16-17 very well-controlled; can consider DC if remain low with adequate PO   - 6/18: well controlled, POs 25-75%, DC CBG 6/19  No results for input(s): "GLUCAP" in the last 72 hours.   16: Acute PE and right lower extremity DVT             -continue Eliquis -> 10 to 5 mg BID dosing 6/17   17: Obesity: BMI 32    18: Chest pain 6/11>>negative trop x 2; EKG non-acute             -attributed to PE versus musculoskeletal   - 6/14: Improved with lidoderm patch. Encourage incentive spirometry.  -6/16 reports this is improved today.   19. Constipation. Schedule Sennakot-S  1 tab QHS and add PRN Miralax daily 17 g.   -6/15 reports improved with BM yesterday   -6/16 denies feeling constipated   - LBM 6/20, large after at bedtime suppository; add Miralax 17g BID  20. Spasticity. Extensor tone in [name] and LLE per therapies. - 6/18:  added Baclofen 5 mg TID. Consider PRAFO.  - 6/19: Benefit with ongoing  mild spasticity, increased to 10 mg twice daily - Continue 10 mg TID  21: Penile wounds, suspected HSV.  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  Completed treatment with valacyclovir 1000 mg twice daily for 14 days. Discussed with pharmacy that treatmenrt has been completed  22. Emesis: scopolamine patch ordered, improved, will d/c  23. Right ear decreased hearing: debrox ear drops ordered, improved, will d/c  LOS: 11 days A FACE TO FACE EVALUATION WAS PERFORMED  Horton Chin 07/30/2022, 6:51 PM

## 2022-07-31 ENCOUNTER — Inpatient Hospital Stay (HOSPITAL_COMMUNITY): Payer: BC Managed Care – PPO

## 2022-07-31 DIAGNOSIS — R110 Nausea: Secondary | ICD-10-CM

## 2022-07-31 DIAGNOSIS — L89152 Pressure ulcer of sacral region, stage 2: Secondary | ICD-10-CM | POA: Diagnosis not present

## 2022-07-31 DIAGNOSIS — B20 Human immunodeficiency virus [HIV] disease: Secondary | ICD-10-CM

## 2022-07-31 DIAGNOSIS — Z91199 Patient's noncompliance with other medical treatment and regimen due to unspecified reason: Secondary | ICD-10-CM

## 2022-07-31 DIAGNOSIS — A812 Progressive multifocal leukoencephalopathy: Secondary | ICD-10-CM | POA: Diagnosis not present

## 2022-07-31 LAB — CBC
HCT: 43.3 % (ref 39.0–52.0)
Hemoglobin: 14.9 g/dL (ref 13.0–17.0)
MCH: 30 pg (ref 26.0–34.0)
MCHC: 34.4 g/dL (ref 30.0–36.0)
MCV: 87.3 fL (ref 80.0–100.0)
Platelets: 138 10*3/uL — ABNORMAL LOW (ref 150–400)
RBC: 4.96 MIL/uL (ref 4.22–5.81)
RDW: 13.9 % (ref 11.5–15.5)
WBC: 6 10*3/uL (ref 4.0–10.5)
nRBC: 0 % (ref 0.0–0.2)

## 2022-07-31 LAB — OSMOLALITY, URINE: Osmolality, Ur: 841 mOsm/kg (ref 300–900)

## 2022-07-31 LAB — BASIC METABOLIC PANEL
Anion gap: 11 (ref 5–15)
BUN: 17 mg/dL (ref 6–20)
CO2: 22 mmol/L (ref 22–32)
Calcium: 9.4 mg/dL (ref 8.9–10.3)
Chloride: 97 mmol/L — ABNORMAL LOW (ref 98–111)
Creatinine, Ser: 0.91 mg/dL (ref 0.61–1.24)
GFR, Estimated: >60 mL/min (ref >60)
Glucose, Bld: 93 mg/dL (ref 70–99)
Potassium: 4.9 mmol/L (ref 3.5–5.1)
Sodium: 130 mmol/L — ABNORMAL LOW (ref 135–145)

## 2022-07-31 LAB — SODIUM, URINE, RANDOM: Sodium, Ur: 42 mmol/L

## 2022-07-31 MED ORDER — SCOPOLAMINE 1 MG/3DAYS TD PT72
1.0000 | MEDICATED_PATCH | TRANSDERMAL | Status: DC
  Administered 2022-07-31 (×2): 1.5 mg via TRANSDERMAL
  Filled 2022-07-31 (×3): qty 1

## 2022-07-31 MED ORDER — GADOBUTROL 1 MMOL/ML IV SOLN
9.0000 mL | Freq: Once | INTRAVENOUS | Status: AC | PRN
  Administered 2022-07-31: 9 mL via INTRAVENOUS

## 2022-07-31 NOTE — Progress Notes (Signed)
   07/31/22 1449  Spiritual Encounters  Type of Visit Initial  Care provided to: Pt not available  Referral source Nurse (RN/NT/LPN)  Reason for visit Advance directives  OnCall Visit No   Chap responded to a consult, to visit Pt. For Advance Care. Nurse informed Mirna Mires Pt was gone for a procedure. Nurse will notify Mirna Mires when Pt is available. Mirna Mires remains available if needed.

## 2022-07-31 NOTE — Progress Notes (Signed)
Occupational Therapy Session Note  Patient Details  Name: Jose Lamb MRN: [id number] Date of Birth: 1961-01-26  Today's Date: 07/31/2022 OT Individual Time: 4098-1191 OT Individual Time Calculation (min): 60 min    Short Term Goals: Week 2:  OT Short Term Goal 1 (Week 2): Pt will complete LB dressing with min A using LRAD OT Short Term Goal 2 (Week 2): Pt will complete toileting tasks with mod A OT Short Term Goal 3 (Week 2): Pt will complete stand pivot transfers with min A  Skilled Therapeutic Interventions/Progress Updates:   Pt  received lying supine in bed.  Pt had no c/o of pain but did state he had nausea and was vomitting the night before leading up until the morning after breakfast. DO rounding during start of session.  Aware of functional and neurological changes including worsening L hemi, speech more slurred and sitting balance worsening.  Pt presenting more of a flat affect today.   Pt was receptive to taking a shower.  Supine<>sit with Min A and moderate verbal cues for posture and weight shifting as well as worsening L lateral lean.  Sit<>stand transfer attempted and Max A +2 therefore Stedy used for shower transfer.  Sitting balanced perched in stedy with Mod-Max A overall.  Upper/Lower body bathing completed with moderate A and max verbal cues for posture, finding midline and L lateral lean.  UB/LB dressing completed with Mod A. Self care completed at sink with Min A.  Pt back in bed with alarm set, call bell within reach and all needs met.    Therapy Documentation Precautions:  Precautions Precautions: Fall Precaution Comments: Lt hemi, poor sitting/standing balance Restrictions Weight Bearing Restrictions: No     Therapy/Group: Individual Therapy  [name] 07/31/2022, 8:22 AM

## 2022-07-31 NOTE — Progress Notes (Signed)
PROGRESS NOTE   Subjective/Complaints: No events overnight. Patient c/o increased nausea and vomitting over the weekend. Nausea present at rest, worse with movement and worse with food intakes. Did feel like scopalamine patch had helped.   Did initially deny vertigo, double vision, or blurring vision; later complains of blurring vision to therapies.   PT noting worsening tone in [name] and LLE this morning. SLP also noting more difficulty swallowing this AM, downgraded diet.  Vitals stable Last BM 6/23  ROS:  + LLE weakness/spasticity -worsening + Balance issues - ongoing + Constipation - resolved + Groin rash/wounds + Bilateral groin pain +emesis/nasuea - worsening  Objective:   No results found. Recent Labs    07/31/22 0544  WBC 6.0  HGB 14.9  HCT 43.3  PLT 138*    Recent Labs    07/31/22 0544  NA 130*  K 4.9  CL 97*  CO2 22  GLUCOSE 93  BUN 17  CREATININE 0.91  CALCIUM 9.4     Intake/Output Summary (Last 24 hours) at 07/31/2022 0807 Last data filed at 07/31/2022 1610 Gross per 24 hour  Intake 478 ml  Output 450 ml  Net 28 ml      Pressure Injury 07/13/22 Sacrum Medial Stage 2 -  Partial thickness loss of dermis presenting as a shallow open injury with a red, pink wound bed without slough. Skin Tear Intergluteal Cleft (Active)  07/13/22 1130  Location: Sacrum  Location Orientation: Medial  Staging: Stage 2 -  Partial thickness loss of dermis presenting as a shallow open injury with a red, pink wound bed without slough.  Wound Description (Comments): Skin Tear Intergluteal Cleft  Present on Admission: Yes    Physical Exam: Vital Signs Blood pressure 138/83, pulse 79, temperature 98.1 F (36.7 C), temperature source Oral, resp. rate 17, height 5\' 11"  (1.803 m), weight 92.1 kg, SpO2 95 %. Gen: no distress, normal appearing HEENT: oral mucosa pink and moist, NCAT, right ear hearing improved +  Nystagmus in right end gaze Cardio: Reg rate Chest: normal effort, normal rate of breathing Abd: soft, non-distended Ext: no edema  Skin: C/D/I. No apparent lesions. + [name] IV, c/d/I.  + Stage 2 to sacrum - from 6/7 admission + Skin tears to penis - healing  MSK:      No apparent deformity.      Strength: 5/5 BL UE RLE 4+/5, unchanged LLE 3/5 HF, 4-/5 KE, 4-/5 DF, PF  - unchanged MAS 1 hip flexors, MAS 1 knee extensors; MAS 1 PF, Mas 2 finger flexors, MAS 1 wrist flexors - increased  Neurologic exam:  Cognition: Mild lethargy, however easily arousable and AAO to person, place, time and event.  + Delayed recall + Delayed motor initiation + Mild cognitive deficit  Language: Fluent, No substitutions or neoglisms. Mild dysarthria.  Insight: Good  insight into current condition.  Mood: Flat affect.  Sensation: To light touch intact b/l. CN: 2-12 grossly intact.        Assessment/Plan: 1. Functional deficits which require 3+ hours per day of interdisciplinary therapy in a comprehensive inpatient rehab setting. Physiatrist is providing close team supervision and 24 hour management of active medical problems listed  below. Physiatrist and rehab team continue to assess barriers to discharge/monitor patient progress toward functional and medical goals  Care Tool:  Bathing    Body parts bathed by patient: Right arm, Left arm, Chest, Abdomen, Front perineal area, Face   Body parts bathed by helper: Buttocks, Right upper leg, Left upper leg, Right lower leg, Left lower leg     Bathing assist Assist Level: Moderate Assistance - Patient 50 - 74%     Upper Body Dressing/Undressing Upper body dressing   What is the patient wearing?: Pull over shirt    Upper body assist Assist Level: Moderate Assistance - Patient 50 - 74%    Lower Body Dressing/Undressing Lower body dressing      What is the patient wearing?: Pants     Lower body assist Assist for lower body dressing:  Dependent - Patient 0%     Toileting Toileting    Toileting assist Assist for toileting: Maximal Assistance - Patient 25 - 49%     Transfers Chair/bed transfer  Transfers assist  Chair/bed transfer activity did not occur: Safety/medical concerns  Chair/bed transfer assist level: Maximal Assistance - Patient 25 - 49%     Locomotion Ambulation   Ambulation assist      Assist level: 2 helpers Assistive device: Walker-rolling Max distance: 12'   Walk 10 feet activity   Assist     Assist level: 2 helpers Assistive device: Walker-rolling   Walk 50 feet activity   Assist Walk 50 feet with 2 turns activity did not occur: Safety/medical concerns         Walk 150 feet activity   Assist Walk 150 feet activity did not occur: Safety/medical concerns         Walk 10 feet on uneven surface  activity   Assist Walk 10 feet on uneven surfaces activity did not occur: Safety/medical concerns         Wheelchair     Assist Is the patient using a wheelchair?: Yes Type of Wheelchair: Manual    Wheelchair assist level: Minimal Assistance - Patient > 75% Max wheelchair distance: 14'    Wheelchair 50 feet with 2 turns activity    Assist        Assist Level: Minimal Assistance - Patient > 75%   Wheelchair 150 feet activity     Assist      Assist Level: Maximal Assistance - Patient 25 - 49%   Blood pressure 138/83, pulse 79, temperature 98.1 F (36.7 C), temperature source Oral, resp. rate 17, height 5\' 11"  (1.803 m), weight 92.1 kg, SpO2 95 %.  Medical Problem List and Plan: 1. 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 Pt with subsequent left greater than right weakness and sensory loss particularly involving the lower extremities.              -patient may shower             -ELOS/Goals: 12-16 days  - 08/09/22  DC date             - Continue CIR  PT/OT/SLP   - Per family request, HIV/AIDs diagnosis to remain confidential between wife and patient; do not inform other family  - 6/18: Per wife needs intermittent SPV goals, not realistic given therapies. He does well with cueing. Max A with sitting balance, transfers, and standing. Requires a lot skilled, verbal, and tactile cues for transferring due to no carryover during  and between sessions. LLE shooting out. Burden of care remains high; will work more on STS and transfers than higher-level dynamics. Per SLP on full diet working on speech intelligibility, will do memory eval.  - 6/21: Updated wife on functional prognosis with PML, enforced need for ongoing support at home and anticipate some ongoing functional decline.  Must stay compliant with antiretroviral therapy.  Will discuss with team possibility of powered mobility, given her own functional deficits.   - 6/24: Worsening overall deficits in spasticity, vision, and nausea/vomitting over the weekend; expect progression of PML. MRI w/ and w/o brain pending; Neurology consulted for recommentations  2.  Antithrombotics: -DVT/anticoagulation:  Pharmaceutical: Eliquis             -antiplatelet therapy: none   3. 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  Scheduled Tylenol 1000 mg 3 times daily, add tramadol 50 mg twice daily as needed.   4. Mood/Behavior/Sleep: LCSW to evaluate and provide emotional support             -antipsychotic agents: n/a   5. Neuropsych/cognition: This patient is capable of making decisions on his own behalf.   - SLP eval   - Neuropsych eval 6/18: While the patient clearly had some cognitive issues most of these were noted having to do with patterns consistent with midbrain and subcortical dysfunction and dysregulation. Patient denied significant depression or anxiety and denied significant  coping difficulties with his current hospitalization although he is anxious about how much she will be able to improve even with therapies.    - 6/21: Per wife, increasing lethargy, depression, and mood lability at home prior to admission.  Possibly signs of depression secondary to cognitive injury and functional decline.  Initiate activating SSRI Lexapro 10 mg daily.   6. Skin/Wound Care: Routine skin care checks  - Sacral stage 2 - offloading with foam and bed mobility  - Penile wounds - keep dry and monitor   7. Fluids/Electrolytes/Nutrition: Routine Is and Os and follow-up chemistries             -carb modified diet   - 6/14: Eating 25-100% meals. SLP dysphagia screen d/t mild dysarthria today.  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 fib: continue Eliquis for PE  -HR controlled   15: DM-2; A1c = 6.6% on 04/10/2022; (no home meds listed)             -serum glucose range: 89-119             -continue carb modified diet  - No CBGs, monitor BID AC for now  - 6/16-17 very well-controlled; can consider DC if remain low with  adequate PO   - 6/18: well controlled, POs 25-75%, DC CBG 6/19   16: Acute PE and right lower extremity DVT             -continue Eliquis -> 10 to 5 mg BID dosing 6/17   17: Obesity: BMI 32    18: Chest pain 6/11>>negative trop x 2; EKG non-acute             -attributed to PE versus musculoskeletal   - 6/14:  Improved with lidoderm patch. Encourage incentive spirometry.  -6/16 reports this is improved today.   19. Constipation. Schedule Sennakot-S 1 tab QHS and add PRN Miralax daily 17 g.   -6/15 reports improved with BM yesterday   -6/16 denies feeling constipated   - LBM 6/23, large after at bedtime suppository;  - add Miralax 17g BID  20. Spasticity. Extensor tone in [name] and LLE per therapies. - 6/18:  added Baclofen 5 mg TID. Consider PRAFO.  - 6/19: Benefit with ongoing mild spasticity, increased to 10 mg twice daily - Continue 10 mg TID  21: Penile wounds, suspected HSV.  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  Completed treatment with valacyclovir 1000 mg twice daily for 14 days. Discussed with pharmacy that treatmenrt has been completed  22. Emesis: scopolamine patch ordered, improved, will d/c -> resume 6/24  23. Right ear decreased hearing: debrox ear drops ordered, improved, will d/c  LOS: 12 days A FACE TO FACE EVALUATION WAS PERFORMED  Angelina Sheriff 07/31/2022, 8:07 AM

## 2022-07-31 NOTE — Progress Notes (Signed)
Physical Therapy Session Note  Patient Details  Name: Jose Lamb MRN: [id number] Date of Birth: 1960/03/11  Today's Date: 07/31/2022 PT Individual Time: 1102-1200 PT Individual Time Calculation (min): 58 min   Short Term Goals: Week 2:  PT Short Term Goal 1 (Week 2): Pt will perform bed mobility consistently with minA. PT Short Term Goal 2 (Week 2): Pt will perform bed to chair transfer consistently with minA. PT Short Term Goal 3 (Week 2): Pt will ambulate x25' with modA and LRAD. PT Short Term Goal 4 (Week 2): Pt will complete x3 steps with BHRs and modA.  Skilled Therapeutic Interventions/Progress Updates:      1st Session: Pt received semi reclined in bed and agrees to therapy. Reports pain in Rt hip. Number not provided. PT communicates pain to RN who provides pt with pain medicine, and PT provides rest breaks and mobility to manage pain. Pt performs supine to sit with modA/maxA and PT managing LLE. Upon sitting at edge of bed pt demonstrates increased posterior drift relative to previous session with this therapist. PT provides modA for stability and cueing for midline orientation. Pt transitions to sideleaning on R forearm to position sliding board for transfer. Pt performs sliding board transfer from elevated bed to [facility] with modA/maxA and facilitation of scooting at hips, as well as education on head hips relationship. WC transport to gym. Pt perform sit to stand in parallel bars with maxA and facilitation of Lt hand placement on parallel bars, as well as manual facilitation of hip and trunk extension. Mirror used for Financial trader. Pt maintains static standing balance for 2-3 minutes, with PT facilitating Lt knee extension, and promoting neutral posture. Pt verbalizes increase in Rt hip pain while standing. Activity terminated due to increased in Rt hip pain. PT provides pt with power WC and pt performs sit to stand with Stedy and maxA for transfer to power WC. Pt then drives power WC  back to room, with minA to prevent pt from contacting objects on Lt and for maneuvering through doorway. TotalA for maneuvering power WC in room. Pt left reclined in power WC with alarm intact and all needs within reach.   2nd Session: Pt had just returned from MRI upon PT arrival. Pt appears very fatigued and requests to rest at this time. PT will follow up as able.   Therapy Documentation Precautions:  Precautions Precautions: Fall Precaution Comments: Lt hemi, poor sitting/standing balance Restrictions Weight Bearing Restrictions: No   Therapy/Group: Individual Therapy  Beau Fanny, PT, DPT 07/31/2022, 4:29 PM

## 2022-07-31 NOTE — Progress Notes (Signed)
Alerted to neuro changes which SLP has noticed over the last several days: left arm weakness, downgraded to dysphagia 3 diet, now complaining of double vision. Going for MRI now. Discussed with Dr. Amada Jupiter. Not likely to change current therapy, but he will review chart, MRI and see the patient.

## 2022-07-31 NOTE — Progress Notes (Signed)
Neurology Progress Note  Brief HPI:  62 y.o. male with medical history significant for prior CVA, paroxysmal atrial fibrillation not on oral anticoagulation, asthma, HIV with noncompliance, history of pulmonary embolism, who presented to [facility] ED with complaints of 6 weeks of left-sided weakness involving his left arm and left leg and frequent falls.  He reports not taking his medications.  MRI of the brain showing right-sided T2 hypertense signal on the MRI primarily cortical/subcortical, in the right greater than left insula and postcentral gyrus,concern for PML. LP was obtained CSF positive for HSV-2 PCR -Valtrex stopped on 5/15. serum RPR positive.  He was discharged to [facility] on 6/7 and readmitted to [facility] for acute chest discomfort and was found to have a large PE.   S:// Called back to see patient for c/o worsening neurological exam over the last few days. Per patient, over the weekend he had left hand swelling and he was having trouble gripping with his left hand. This morning he states he had some vertigo and double vision, side by side double vision in the left eye that only lasted a few minutes. He states he has had vertigo in the past and it usually happens and then resolves fast.  He had a repeat MRI brain today that revealed no significant change from prior study  O:// Current vital signs: BP (!) 129/93 (BP Location: Right Arm)   Pulse 75   Temp 97.6 F (36.4 C)   Resp 16   Ht 5\' 11"  (1.803 m)   Wt 92.1 kg   SpO2 95%   BMI 28.32 kg/m  Vital signs in last 24 hours: Temp:  [97.6 F (36.4 C)-98.8 F (37.1 C)] 97.6 F (36.4 C) (06/24 1434) Pulse Rate:  [75-79] 75 (06/24 1434) Resp:  [16-19] 16 (06/24 1434) BP: (119-149)/(60-93) 129/93 (06/24 1434) SpO2:  [95 %] 95 % (06/24 1434)  GENERAL: Awake, alert in NAD HEENT: - Normocephalic and atraumatic, dry mm LUNGS - Clear to auscultation bilaterally with no wheezes CV - S1S2 RRR, no m/r/g, equal pulses bilaterally. ABDOMEN - Soft,  nontender, nondistended with normoactive BS Ext: warm, well perfused, intact peripheral pulses, no edema  NEURO:  Mental Status: AA&Ox3 Language: speech is dysarthric  Naming, repetition, fluency, and comprehension intact.  He has some left neglect Cranial Nerves: PERRL. EOMI, visual fields full, left facial asymmetry at rest with subtle droop when smiling, facial sensation intact, hearing intact, tongue/uvula/soft palate midline, normal sternocleidomastoid and trapezius muscle strength. No evidence of tongue atrophy or fibrillations Motor: right arm 5/5, left arm 3/5, left grip weaker than right 3/5, right leg 4/5, left leg 1/5 Tone: is normal and bulk is normal Sensation- decreased on left arm and left leg  Coordination: FTN intact  Gait- deferred    Medications  Current Facility-Administered Medications:    acetaminophen (TYLENOL) tablet 1,000 mg, 1,000 mg, Oral, TID, Shearon Stalls, Morgan C, DO, 1,000 mg at 07/31/22 1000   albuterol (PROVENTIL) (2.5 MG/3ML) 0.083% nebulizer solution 2.5 mg, 2.5 mg, Nebulization, Q4H PRN, Setzer, Sandra J, [name]   alum & mag hydroxide-simeth (MAALOX/MYLANTA) 200-200-20 MG/5ML suspension 30 mL, 30 mL, Oral, Q4H PRN, Setzer, Sandra J, [name]   [COMPLETED] apixaban (ELIQUIS) tablet 10 mg, 10 mg, Oral, BID, 10 mg at 07/23/22 1920 **FOLLOWED BY** apixaban (ELIQUIS) tablet 5 mg, 5 mg, Oral, BID, Setzer, Sandra J, [name], 5 mg at 07/31/22 1000   baclofen (LIORESAL) tablet 10 mg, 10 mg, Oral, TID, Elijah Birk C, DO, 10 mg at 07/31/22 1000  bictegravir-emtricitabine-tenofovir AF (BIKTARVY) 50-200-25 MG per tablet 1 tablet, 1 tablet, Oral, Daily, Milinda Antis, [name], 1 tablet at 07/31/22 1000   bisacodyl (DULCOLAX) suppository 10 mg, 10 mg, Rectal, Daily PRN, Milinda Antis, [name], 10 mg at 07/29/22 1356   cyanocobalamin (VITAMIN B12) tablet 1,000 mcg, 1,000 mcg, Oral, Daily, Milinda Antis, [name], 1,000 mcg at 07/31/22 1000   escitalopram (LEXAPRO) tablet 10 mg, 10  mg, Oral, Daily, Elijah Birk C, DO, 10 mg at 07/31/22 1000   guaiFENesin-dextromethorphan (ROBITUSSIN DM) 100-10 MG/5ML syrup 10 mL, 10 mL, Oral, Q6H PRN, Setzer, Lynnell Jude, [name]   lidocaine (LIDODERM) 5 % 1 patch, 1 patch, Transdermal, Q24H, Elijah Birk C, DO, 1 patch at 07/30/22 1223   ondansetron (ZOFRAN) tablet 4 mg, 4 mg, Oral, Q6H PRN, 4 mg at 07/31/22 0706 **OR** ondansetron (ZOFRAN) injection 4 mg, 4 mg, Intravenous, Q6H PRN, Setzer, Sandra J, [name]   polyethylene glycol (MIRALAX / GLYCOLAX) packet 17 g, 17 g, Oral, BID, Engler, Morgan C, DO   protein supplement (ENSURE MAX) liquid, 11 oz, Oral, Daily, Fanny Dance, MD, 11 oz at 07/30/22 1224   rosuvastatin (CRESTOR) tablet 10 mg, 10 mg, Oral, Daily, Milinda Antis, [name], 10 mg at 07/31/22 1000   scopolamine (TRANSDERM-SCOP) 1 MG/3DAYS 1.5 mg, 1 patch, Transdermal, Q72H, Engler, Morgan C, DO, 1.5 mg at 07/31/22 1001   senna-docusate (Senokot-S) tablet 1 tablet, 1 tablet, Oral, QHS, Elijah Birk C, DO, 1 tablet at 07/30/22 1959   sulfamethoxazole-trimethoprim (BACTRIM DS) 800-160 MG per tablet 1 tablet, 1 tablet, Oral, Daily, Milinda Antis, [name], 1 tablet at 07/31/22 1000   traMADol (ULTRAM) tablet 50 mg, 50 mg, Oral, Q12H PRN, Angelina Sheriff, DO, 50 mg at 07/31/22 1129 Labs CBC    Component Value Date/Time   WBC 6.0 07/31/2022 0544   RBC 4.96 07/31/2022 0544   HGB 14.9 07/31/2022 0544   HCT 43.3 07/31/2022 0544   PLT 138 (L) 07/31/2022 0544   MCV 87.3 07/31/2022 0544   MCH 30.0 07/31/2022 0544   MCHC 34.4 07/31/2022 0544   RDW 13.9 07/31/2022 0544   LYMPHSABS 1.2 07/20/2022 0553   MONOABS 0.7 07/20/2022 0553   EOSABS 0.1 07/20/2022 0553   BASOSABS 0.0 07/20/2022 0553    CMP     Component Value Date/Time   NA 130 (L) 07/31/2022 0544   K 4.9 07/31/2022 0544   CL 97 (L) 07/31/2022 0544   CO2 22 07/31/2022 0544   GLUCOSE 93 07/31/2022 0544   BUN 17 07/31/2022 0544   CREATININE 0.91 07/31/2022 0544    CALCIUM 9.4 07/31/2022 0544   PROT 8.1 07/20/2022 0553   ALBUMIN 3.1 (L) 07/20/2022 0553   ALBUMIN NOT PERFORMED 07/08/2022 1405   AST 16 07/20/2022 0553   ALT 22 07/20/2022 0553   ALKPHOS 63 07/20/2022 0553   BILITOT 0.8 07/20/2022 0553   GFRNONAA >60 07/31/2022 0544   GFRAA >60 09/19/2019 0657     Lipid Panel     Component Value Date/Time   CHOL 197 04/10/2022 1130   TRIG 158 (H) 04/10/2022 1130   HDL 39 (L) 04/10/2022 1130   CHOLHDL 5.1 04/10/2022 1130   VLDL 32 04/10/2022 1130   LDLCALC 126 (H) 04/10/2022 1130     Imaging I have reviewed images in epic and the results pertinent to this consultation are:  MRI examination of the brain No significant change from prior exam  Redemonstration of multifocal regions of cortical and subcortical T2/FLAIR hyperintense lesions  in the bilateral parietal lobes, bilateral insular regions, right thalamocapsular region, right superior colliculus, and periventricular region of the pons. No evidence of abnormal contrast enhancement.   Assessment:  62 y.o. male with medical history significant for prior CVA, paroxysmal atrial fibrillation not on oral anticoagulation, asthma, HIV with noncompliance, history of pulmonary embolism, who presented to [facility] ED with complaints of 6 weeks of left-sided weakness involving his left arm and left leg and frequent falls found to have PML  Recommendations: - no new recommendations from neurology standpoint  - Continue PT/OT/ST   Gemma Payor, NP  I have seen the patient and reviewed the above note.  Mr. Brummitt is a 62 year old gentleman with untreated HIV who developed imaging findings consistent with PML and had CSF which was positive for JC virus confirming this diagnosis.  He has been restarted on Haart therapy.  He does have significant neglect, so the fact that he does not notice any worsening is not really reliable, but he seems fairly similar to the last documented exam.  I do not think we have  any real reason to suspect IRIS given the lack of enhancement, and mild change.  I would not favor immune suppression with steroids or modification of his HIV therapy, though if he were to continue to worsen we may need to revisit this with infectious disease.   Neurology will continue to be available on an as-needed basis, please call if there remain any further questions or concerns.  Ritta Slot, MD [facility] [phone]  If 7pm- 7am, please page neurology on call as listed in AMION.

## 2022-07-31 NOTE — Progress Notes (Signed)
Speech Language Pathology Daily Session Note  Patient Details  Name: Jose Lamb MRN: [id number] Date of Birth: May 09, 1960  Today's Date: 07/31/2022 SLP Individual Time: 1914-7829 SLP Individual Time Calculation (min): 61 min  Short Term Goals: Week 1: SLP Short Term Goal 1 (Week 2): Patient will consume current diet with Mod I for use of swallowing compensatory strategies. SLP Short Term Goal 2 (Week 2): Patient will consume trials of thin liquids via STRAW without overt s/s of aspiration over 2 sessions prior to upgrade. SLP Short Term Goal 3 (Week 2): Patient will utilize speech intelligibility strategies at the sentence level to achieve ~90% intelligibility with supervision verbal cues. SLP Short Term Goal 4 (Week 2): Patient will demonstrate functional problem solving for mildly complex tasks with supervision verbal cues.  Skilled Therapeutic Interventions: Skilled treatment session focused on dysphagia and communication goals. Patient with decreased speech intelligibility this date and report of functional decline over the weekend. Patient reports occasionally coughing when taking medications with thin liquids. SLP observed patient consume ice chips, tsp of water via spoon, and cup sips of water. Coughing noted when SLP administered liquids, however no overt s/sx of aspiration during self administration. Patient consumed purees and solids with adequate oral clearance and no overt s/sx of aspiration. Patient reports he prefers softer foods and sometime has difficulty consuming regular solids. SLP to downgrade diet to dys 3 to aid in mastication. Recommend medications whole in puree (one at a time).   SLP facilitated communication through verbal description tasks. SLP provided moderate verbal cues to over-articulate and utilize slow rate when speaking. Patient utilized these strategies during the activity and was ~80% intelligible at phrase level throughout.   Patient transferred to bed to  be taken to MRI. Handoff directly to transport. Continue with current POC.    Pain Pain Assessment Faces Pain Scale: No hurt Patient reports double vision this date. MRI already schedule for today. Noted decreased intelligibility. MD Notified.   Therapy/Group: Individual Therapy  Trenese Haft M.A., CF-SLP 07/31/2022, 3:26 PM

## 2022-08-01 ENCOUNTER — Inpatient Hospital Stay (HOSPITAL_COMMUNITY): Payer: BC Managed Care – PPO

## 2022-08-01 DIAGNOSIS — A812 Progressive multifocal leukoencephalopathy: Secondary | ICD-10-CM | POA: Diagnosis not present

## 2022-08-01 LAB — BASIC METABOLIC PANEL
Anion gap: 10 (ref 5–15)
BUN: 20 mg/dL (ref 6–20)
CO2: 25 mmol/L (ref 22–32)
Calcium: 9.5 mg/dL (ref 8.9–10.3)
Chloride: 97 mmol/L — ABNORMAL LOW (ref 98–111)
Creatinine, Ser: 0.99 mg/dL (ref 0.61–1.24)
GFR, Estimated: >60 mL/min (ref >60)
Glucose, Bld: 102 mg/dL — ABNORMAL HIGH (ref 70–99)
Potassium: 4.5 mmol/L (ref 3.5–5.1)
Sodium: 132 mmol/L — ABNORMAL LOW (ref 135–145)

## 2022-08-01 LAB — URINALYSIS, W/ REFLEX TO CULTURE (INFECTION SUSPECTED)
Bacteria, UA: NONE SEEN
Bilirubin Urine: NEGATIVE
Glucose, UA: NEGATIVE mg/dL
Ketones, ur: 5 mg/dL — AB
Leukocytes,Ua: NEGATIVE
Nitrite: NEGATIVE
Protein, ur: 100 mg/dL — AB
RBC / HPF: >50 RBC/hpf (ref 0–5)
Specific Gravity, Urine: 1.026 (ref 1.005–1.030)
pH: 5 (ref 5.0–8.0)

## 2022-08-01 LAB — OSMOLALITY: Osmolality: 287 mOsm/kg (ref 275–295)

## 2022-08-01 MED ORDER — BACLOFEN 5 MG HALF TABLET
15.0000 mg | ORAL_TABLET | Freq: Three times a day (TID) | ORAL | Status: DC
  Administered 2022-08-01 – 2022-08-04 (×9): 15 mg via ORAL
  Filled 2022-08-01 (×10): qty 1

## 2022-08-01 MED ORDER — SODIUM CHLORIDE 0.9 % IV SOLN: INTRAVENOUS | Status: DC

## 2022-08-01 NOTE — Progress Notes (Signed)
   08/01/22 1600  Spiritual Encounters  Type of Visit Initial  Care provided to: Pt not available  Referral source Nurse (RN/NT/LPN)  Reason for visit Advance directives  OnCall Visit No   Ch responded to request for AD. There was no family at bedside. Ch went to visit pt but he was sleeping. Ch will follow-up tomorrow.

## 2022-08-01 NOTE — Patient Care Conference (Cosign Needed)
Inpatient RehabilitationTeam Conference and Plan of Care Update Date: 08/01/2022   Time: 10:07 AM    Patient Name: Jose Lamb      Medical Record Number: [id number]  Date of Birth: February 08, 1960 Sex: Male         Room/Bed: 4M11C/4M11C-01 Payor Info: Payor: BLUE [employer]UE [employer] / Plan: BCBS COMM PPO / Product Type: *No Product type* /    Admit Date/Time:  07/19/2022  3:14 PM  Primary Diagnosis:  PML (progressive multifocal leukoencephalopathy) [facility])  Hospital Problems: Principal Problem:   PML (progressive multifocal leukoencephalopathy) [facility])    Expected Discharge Date: Expected Discharge Date: 08/09/22  Team Members Present: Physician leading conference: Dr. Elijah Birk Social Worker Present: Cecile Sheerer, LCSWA Nurse Present: Vedia Pereyra, RN PT Present: Malachi Pro, PT OT Present: Jake Shark, OT SLP Present: Feliberto Gottron, SLP PPS Coordinator present : Fae Pippin, SLP     Current Status/Progress Goal Weekly Team Focus  Bowel/Bladder   pt is continent to bowel and bladder with incontinent or accidental episodes to bladder   pt to regain full continence   assist patient with toileting needs assess every shift and prn    Swallow/Nutrition/ Hydration   Dys. 3 textures with thin liquids via cup (no straw), full supervision. Goals may need to be downgraded if patient continues to decline functionally.   Mod I  tolerance of current diet, use of swallowing compensatory strategies to maximize safety with PO intake.    ADL's   Declined over the weekend pt now requiring Mod A with UB dressing, Max with LB dressing,UB bathing Mod A and LB with Max assist.  L lateral lean worsening, flexor and exensor tone present in [name] with decreased fucntional use.   Min A ADLs and transfers   ADL retraining to reduce caregiver burden, transfer training, postural control, dynamic balance    Mobility   maxA to totalA all mobility, performing only bed mobility and trasnfers,  worsening trunk control and sitting balance   MinA/Supv with LRAD, need to downgrade  Family education, possible WC evaluation and other evaluation for DME necessary for safe DC, should he go home, decreasking caregiver burden of care, sitting balance, transfers, WC mobility    Communication   Speech intelligibility declined over the weekend, now requires overall Mod A for use of speech intelligibility strategies. Goals may need to be downgraded if patient continues to decline functionally   Mod I at the conversation level   use of speech intelligibility strategies at the sentence level during structured and informal tasks. Self-monitoring and correcting communication breakdowns    Safety/Cognition/ Behavioral Observations  Min A for complex problem solving. Goal may need to be downgraded if patient continues to decline functionally   Mod I   functional problem solving for mildly complex tasks.    Pain   pt c/o of intermittent pain controlled with scheduled tylenlol and tramadol for breakthough pain   <3/10   assess every shift and prn    Skin   skin intact   skin to remian intact   Assess every shift and PRN       Discharge Planning:  D/c plan remain for pt to discharge to home with his wife who will be primary caregiver. Pt will need to be light Min Asst to supersvision as his wife has back issues. She states she will provide the support needed. Children and relativeds work during the day. [facility] for HHPT/OT/SLP. SW will confirm there are no barriers to discharge.  Team Discussion: PML. Pain managed with scheduled medications and PRN medications. Patient not improving. AIDS, SIADH. N/V this AM.  Sodium low. Start fluids. Will get chest xray and U/A with C&S via cath due to small amount of output.. May have Palliative see patient for goals of care. Therapy also has noted decline with poor motor planning, endurance, change in speech, Diet downgraded to D3/thin no straws  with meds whole one at a time in puree. Now a full supervision. Unable to operate electric wheelchair.  Patient on target to meet rehab goals: no, not meeting goals with est. Discharge 08/09/22  *See Care Plan and progress notes for long and short-term goals.   Revisions to Treatment Plan:  Medication adjustments, Fluids. Chest xray. U/A with C&s. Monitor labs/VS  Teaching Needs: Medications, safety, self care, transfer training, etc.   Current Barriers to Discharge: Decreased caregiver support, Incontinence, and Medication compliance  Possible Resolutions to Barriers: Family education, regain continence, compliant with HAART medications, order recommended DME if needed.      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, depression, nausea/vomiting, hypertension, uncontrolled pain, tachycardia, constipation, and AIDS  Barriers to Discharge: Electrolyte abnormality;Inadequate Nutritional Intake;Medical stability;Self-care education;Spasticity;Uncontrolled Pain  Barriers to Discharge Comments: Recurrent nausea/vomiting, shoulder pain, SIADH, intermittent tachycardia, HAART noncompliance Possible Resolutions to [employer] Focus: Repeat imaging and neurology consult for neurologic deficits, titrating medications to avoid recurrent nausea/emesis and treat, fluid restriction and serial lab monitoring for SIADH   Continued Need for Acute Rehabilitation Level of Care: The patient requires daily medical management by a physician with specialized training in physical medicine and rehabilitation for the following reasons: Direction of a multidisciplinary physical rehabilitation program to maximize functional independence : Yes Medical management of patient stability for increased activity during participation in an intensive rehabilitation regime.: Yes Analysis of laboratory values and/or radiology reports with any subsequent need for medication adjustment and/or medical  intervention. : Yes   I attest that I was present, lead the team conference, and concur with the assessment and plan of the team.   Jearld Adjutant 08/01/2022, 3:29 PM

## 2022-08-01 NOTE — Progress Notes (Signed)
Patient ID: Jose Lamb, male   DOB: 1960-06-17, 62 y.o.   MRN: [id number]  SW went by pt room to provide updates from team conference but pt sleeping. SW will follow-up.   [id number] spoke with pt wife Jose Lamb to provide updates from team conference, and attending working on a medical work-up. She would like a call from physician with updates. SW informed attending.   Cecile Sheerer, MSW, LCSWA Office: [phone] Cell: [phone] Fax: [phone]

## 2022-08-01 NOTE — Progress Notes (Signed)
Occupational Therapy Session Note  Patient Details  Name: Jose Lamb MRN: [id number] Date of Birth: [birth year]  Today's Date: 08/01/2022 OT Individual Time: 0851-1000 OT Individual Time Calculation (min): 69 min    Short Term Goals: Week 2:  OT Short Term Goal 1 (Week 2): Pt will complete LB dressing with min A using LRAD OT Short Term Goal 2 (Week 2): Pt will complete toileting tasks with mod A OT Short Term Goal 3 (Week 2): Pt will complete stand pivot transfers with min A  Skilled Therapeutic Interventions/Progress Updates:    Pt received supine reporting general malaise and nausea/vomiting previously. He was still very eager to participate. He completed LB bathing supine with mod A for thoroughness and to sequence rolling. He required min A to roll R and mod A to roll L. He had an overall flat affect. He completed bed mobility to EOB with mod A, requiring assist for sequencing and [name]/LE management. EOB sitting balance with mod-max A, very poor midline orientation. Donned shirt EOB and pt demonstrated worsening apraxia- unable to motor plan to pull shirt overhead and requiring max A to thread over each arm. Pants donned with total A +2. He required total A for slideboard to power w/c. Pt attempted to turn the w/c in the room and was unable to follow cueing and required max A to correct as he continued in a circle to the R. He completed oral care and grooming tasks with set up assist. He required mod A overall for power w/c navigation d/t frequent inattention to task at hand, L/R veering in the hallway and running into objects blatantly in front of him despite cueing from OT. At this time pt very unsafe to use power mobility in the home but will continue to assess. In the therapy gym pt worked on activity focused on L attention, gathering items from the far L visual field. He required max cueing and even facilitation for L head turn. He was unable to twist his trunk L. He returned to his room  and was left sitting up, SLP arriving.   Therapy Documentation Precautions:  Precautions Precautions: Fall Precaution Comments: Lt hemi, poor sitting/standing balance Restrictions Weight Bearing Restrictions: No  Therapy/Group: Individual Therapy  Crissie Reese 08/01/2022, 10:44 AM

## 2022-08-01 NOTE — Progress Notes (Signed)
Physical Therapy Session Note  Patient Details  Name: Jose Lamb MRN: [id number] Date of Birth: 01-18-1961  Today's Date: 08/01/2022 PT Missed Time: 75 Minutes Missed Time Reason: Patient fatigue;Other (Comment) (Nausea)  Short Term Goals: Week 2:  PT Short Term Goal 1 (Week 2): Pt will perform bed mobility consistently with minA. PT Short Term Goal 2 (Week 2): Pt will perform bed to chair transfer consistently with minA. PT Short Term Goal 3 (Week 2): Pt will ambulate x25' with modA and LRAD. PT Short Term Goal 4 (Week 2): Pt will complete x3 steps with BHRs and modA.  Skilled Therapeutic Interventions/Progress Updates:     Pt received supine in bed. Pt reports feeling "woozy" and nauseous. RN notified. Pt politely declines therapy at this time. PT will follow up as able.   Therapy Documentation Precautions:  Precautions Precautions: Fall Precaution Comments: Lt hemi, poor sitting/standing balance Restrictions Weight Bearing Restrictions: No General: PT Amount of Missed Time (min): 75 Minutes PT Missed Treatment Reason: Patient fatigue;Other (Comment) (Nausea)   Therapy/Group: Individual Therapy  Beau Fanny, PT, DPT 08/01/2022, 4:54 PM

## 2022-08-01 NOTE — Progress Notes (Signed)
PROGRESS NOTE   Subjective/Complaints:  2 episodes of emesis this a.m.  KUB ordered.  Does think scopalamine is helping nausea. Denies any vision deficits, does endorse some dizziness, no abdominal pain.  Bowel movement 2 days prior. Per nursing, refusing all bowel medications except daily suppository.   Urine studies indicating pattern of SIADH.  Patient was placed on fluid restriction yesterday, labs this a.m. pending. Given lethargy/AMS will start IVF.   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 CONTRAST:  9mL GADAVIST GADOBUTROL 1 MMOL/ML IV SOLN COMPARISON:  MR head 07/07/22 FINDINGS: Brain: Negative for an acute infarct. No hemorrhage. No hydrocephalus. No extra-axial fluid collection. Redemonstrated are multifocal regions of cortical and subcortical T2/FLAIR hyperintense signal abnormality in the bilateral parietal lobes, bilateral insular regions, right thalamocapsular region right superior colliculus, in the periventricular region of the pons. There is no evidence of abnormal contrast enhancement. Enlarged and partially empty sella Vascular: Normal flow voids. Skull and upper cervical spine: Normal marrow signal. Likely moderate spinal canal narrowing at C3-C4. Sinuses/Orbits: No middle ear or mastoid effusion. Paranasal sinuses are clear. Orbits are unremarkable. Other: Previously seen bilateral parotid  mass is incompletely imaged only seen on the sagittal T1 weighted sequences. See prior report for more detailed characterization. IMPRESSION: 1. No significant change from prior exam.  There is 2. Redemonstration of multifocal regions of cortical and subcortical T2/FLAIR hyperintense lesions in the bilateral parietal lobes, bilateral insular regions, right thalamocapsular region, right superior colliculus, and periventricular region of the pons. No evidence of abnormal contrast enhancement. Given appearance and patient's immunocompromised status, findings could be seen setting of PML or nonspecific encephalitis. 3. Moderate spinal canal narrowing at C3-C4. Electronically Signed   By: Lorenza Cambridge M.D.   On: 07/31/2022 16:07   Recent Labs    07/31/22 0544  WBC 6.0  HGB 14.9  HCT 43.3  PLT 138*     Recent Labs    07/31/22 0544  NA 130*  K 4.9  CL 97*  CO2 22  GLUCOSE 93  BUN 17  CREATININE 0.91  CALCIUM 9.4      Intake/Output Summary (Last 24 hours) at 08/01/2022 0908 Last data filed at 07/31/2022 1733 Gross per 24 hour  Intake 295 ml  Output 100 ml  Net 195 ml      Pressure Injury 07/13/22 Sacrum Medial Stage 2 -  Partial thickness loss of dermis presenting as a shallow open injury with a red, pink wound bed without slough. Skin Tear Intergluteal Cleft (Active)  07/13/22 1130  Location: Sacrum  Location Orientation: Medial  Staging: Stage 2 -  Partial thickness loss of dermis presenting as a shallow open injury with a red, pink wound  bed without slough.  Wound Description (Comments): Skin Tear Intergluteal Cleft  Present on Admission: Yes    Physical Exam: Vital Signs Blood pressure (!) 141/78, pulse (!) 103, temperature 97.9 F (36.6 C), temperature source Oral, resp. rate 16, height 5\' 11"  (1.803 m), weight 92.1 kg, SpO2 93 %.  Gen: no distress, normal appearing.  Sitting up in bedside chair.  Awake. HEENT: oral mucosa pink and moist, NCAT, right ear hearing improved   EOMI, difficult due to severe left hemineglect.  Difficulty with convergence. Cardio: Irregular rhythm, regular rate.  No murmurs, rubs, gallops. Chest: normal effort, normal rate of breathing.  Clear to auscultation bilaterally. Abd: soft, non-distended, nontender.  Positive bowel sounds. Ext: no edema  Skin: C/D/I. No apparent lesions.  + Stage 2 to sacrum - from 6/7 admission + Skin tears to penis - healing  MSK:      No apparent deformity.      Strength: 5/5 BL UE. + Severe left hemineglect limiting exam.  Antigravity and against resistance all 4 extremities with significant encouragement MAS 2 hip flexors, MAS 1 knee extensors; MAS 1 PF, Mas 2 finger flexors, MAS 2 wrist flexors, MAS 3 elbow flexor, MAS 3 shoulder adductor - increased  Neurologic exam:  Cognition: AAO x 3, however with increased time to response and slow initiation, motor planning difficulties.   + Delayed recall + Delayed motor initiation + Moderate cognitive deficit  Language: Fluent, No substitutions or neoglisms. Worsening dysarthria.  Insight: Poor insight into current condition.  Mood: Flat affect.  Sensation: Mildly decreased in left upper and lower extremity CN: 2-12 grossly intact.  Mild facial droop apparent when smiling.       Assessment/Plan: 1. Functional deficits which require 3+ hours per day of interdisciplinary therapy in a comprehensive inpatient rehab setting. Physiatrist is providing close team supervision and 24 hour management of active medical problems listed below. Physiatrist and rehab team continue to assess barriers to discharge/monitor patient progress toward functional and medical goals  Care Tool:  Bathing    Body parts bathed by patient: Right arm, Left arm, Chest, Abdomen, Front perineal area, Face   Body parts bathed by helper: Buttocks, Right upper leg, Left upper leg, Right lower leg, Left lower leg     Bathing assist Assist Level: Moderate Assistance - Patient  50 - 74%     Upper Body Dressing/Undressing Upper body dressing   What is the patient wearing?: Pull over shirt    Upper body assist Assist Level: Moderate Assistance - Patient 50 - 74%    Lower Body Dressing/Undressing Lower body dressing      What is the patient wearing?: Pants     Lower body assist Assist for lower body dressing: Dependent - Patient 0%     Toileting Toileting    Toileting assist Assist for toileting: Maximal Assistance - Patient 25 - 49%     Transfers Chair/bed transfer  Transfers assist  Chair/bed transfer activity did not occur: Safety/medical concerns  Chair/bed transfer assist level: Maximal Assistance - Patient 25 - 49%     Locomotion Ambulation   Ambulation assist      Assist level: 2 helpers Assistive device: Walker-rolling Max distance: 12'   Walk 10 feet activity   Assist     Assist level: 2 helpers Assistive device: Walker-rolling   Walk 50 feet activity   Assist Walk 50 feet with 2 turns activity did not occur: Safety/medical concerns         Walk  150 feet activity   Assist Walk 150 feet activity did not occur: Safety/medical concerns         Walk 10 feet on uneven surface  activity   Assist Walk 10 feet on uneven surfaces activity did not occur: Safety/medical concerns         Wheelchair     Assist Is the patient using a wheelchair?: Yes Type of Wheelchair: Manual    Wheelchair assist level: Minimal Assistance - Patient > 75% Max wheelchair distance: 44'    Wheelchair 50 feet with 2 turns activity    Assist        Assist Level: Minimal Assistance - Patient > 75%   Wheelchair 150 feet activity     Assist      Assist Level: Maximal Assistance - Patient 25 - 49%   Blood pressure (!) 141/78, pulse (!) 103, temperature 97.9 F (36.6 C), temperature source Oral, resp. rate 16, height 5\' 11"  (1.803 m), weight 92.1 kg, SpO2 93 %.  Medical Problem List and Plan: 1.  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 Pt with subsequent left greater than right weakness and sensory loss particularly involving the lower extremities.              -patient may shower             -ELOS/Goals: 12-16 days  - 08/09/22  DC date             - Continue CIR PT/OT/SLP   - Per family request, HIV/AIDs diagnosis to remain confidential between wife and patient; do not inform other family  - 6/18: Per wife needs intermittent SPV goals, not realistic given therapies. He does well with cueing. Max A with sitting balance, transfers, and standing. Requires a lot skilled, verbal, and tactile cues for transferring due to no carryover during and between sessions. LLE shooting out. Burden of care remains high; will work more on STS and transfers than higher-level dynamics. Per SLP on full diet working on speech intelligibility, will do memory eval.  - 6/21: Updated wife on functional prognosis with PML, enforced need for ongoing support at home and anticipate some ongoing functional decline.  Must stay compliant with antiretroviral therapy.  Will discuss with team possibility of powered mobility, given her own functional deficits.   - 6/24: Worsening overall deficits in spasticity, vision, and nausea/vomitting over the weekend; expect progression of PML. MRI w/ and w/o brain pending; Neurology consulted for recommentations  - 6/25: Further cognitive and physical decline, poor safety awareness, D3 diet, spasticity seems somewhat better today.  Awaiting a.m. labs, suspect worsening hyponatremia, start IV fluids 100 cc/h.  2.  Antithrombotics: -DVT/anticoagulation:  Pharmaceutical: Eliquis             -antiplatelet therapy: none   3. 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  Scheduled  Tylenol 1000 mg 3 times daily, add tramadol 50 mg twice daily as needed.   4. Mood/Behavior/Sleep: LCSW to evaluate and provide emotional support             -antipsychotic agents: n/a   5. Neuropsych/cognition: This patient is capable of making decisions on his own behalf.   - SLP eval   - Neuropsych eval 6/18: While the patient clearly had some cognitive issues most of these were noted having  to do with patterns consistent with midbrain and subcortical dysfunction and dysregulation. Patient denied significant depression or anxiety and denied significant coping difficulties with his current hospitalization although he is anxious about how much she will be able to improve even with therapies.    - 6/21: Per wife, increasing lethargy, depression, and mood lability at home prior to admission.  Possibly signs of depression secondary to cognitive injury and functional decline.  Initiate activating SSRI Lexapro 10 mg daily.  - 6/25: Discontinue Lexapro, tramadol due to potential serotonergic drug interaction contributing to emesis, confusion.   6. Skin/Wound Care: Routine skin care checks  - Sacral stage 2 - offloading with foam and bed mobility  - Penile wounds - keep dry and monitor   7. Fluids/Electrolytes/Nutrition: Routine Is and Os and follow-up chemistries             -carb modified diet   - 6/14: Eating 25-100% meals. SLP dysphagia screen d/t mild dysarthria today.  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 fib: continue Eliquis for PE  -HR controlled   15: DM-2; A1c = 6.6% on 04/10/2022; (no home meds listed)             -serum glucose range: 89-119             -continue carb modified diet  - No CBGs, monitor BID AC for now  - 6/16-17 very well-controlled; can consider DC if remain low with adequate PO   - 6/18: well controlled, POs 25-75%, DC CBG 6/19   16: Acute PE and right lower extremity DVT             -continue Eliquis -> 10 to 5 mg BID dosing 6/17   17: Obesity: BMI 32    18: Chest pain 6/11>>negative trop x 2; EKG non-acute             -attributed to PE versus musculoskeletal   - 6/14: Improved with lidoderm patch. Encourage incentive spirometry.  -6/16 reports this is improved today.   19. Constipation. Schedule Sennakot-S 1 tab QHS and add PRN Miralax daily 17 g.   -6/15 reports improved with BM yesterday   -6/16 denies feeling constipated   - LBM 6/23, large after at bedtime suppository;  - add Miralax 17g BID -KUB pending  20. Spasticity. Extensor tone in [name] and LLE per therapies. - 6/18:  added Baclofen 5 mg TID. Consider PRAFO.  - 6/19: Benefit with ongoing mild spasticity, increased to 10 mg twice daily - Continue 10 mg TID - 6/25: Increased baclofen to 15 mg TID   21: Penile wounds, suspected HSV.  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  Completed treatment with valacyclovir 1000 mg twice daily for 14 days. Discussed with pharmacy that treatmenrt has been completed  22. Emesis: scopolamine patch ordered, improved, will d/c -> resume 6/24; she endorses improvement but continues to require as needed Zofran, worsening emesis. -KUB pending  23. Right ear decreased hearing: debrox ear drops ordered, improved, will d/c  24.  Encephalopathy/altered mental status  -Neurology consulted, MRI with and without yesterday without any worsening of PML, low suspicion for IRIS at this time but will monitor closely.   Appreciate their recommendations.  -Has remained afebrile, normotensive, without gross signs of infection; however immunocompromise due to AIDs.  Will get a urine with reflex to culture, chest x-ray, KUB today.  -DC tramadol Lexapro due to possible serotonergic effects causing altered mental status, worsening tone, and emesis.  -Labs yesterday significant for hyponatremia 130; placed on fluid restriction, pending labs today, start IV fluids 100 cc/h  -If no apparent identifiable cause, will consult ID for further recommendations  LOS: 13 days A FACE TO FACE EVALUATION WAS PERFORMED  Angelina Sheriff 08/01/2022, 9:08 AM

## 2022-08-01 NOTE — Progress Notes (Signed)
Speech Language Pathology Daily Session Note  Patient Details  Name: Jose Lamb MRN: [id number] Date of Birth: 03/16/1960  Today's Date: 08/01/2022 SLP Individual Time: 1003-1100 SLP Individual Time Calculation (min): 57 min  Short Term Goals: Week 2: SLP Short Term Goal 1 (Week 2): Patient will consume current diet with Mod I for use of swallowing compensatory strategies. SLP Short Term Goal 2 (Week 2): Patient will consume trials of thin liquids via STRAW without overt s/s of aspiration over 2 sessions prior to upgrade. SLP Short Term Goal 3 (Week 2): Patient will utilize speech intelligibility strategies at the sentence level to achieve ~90% intelligibility with supervision verbal cues. SLP Short Term Goal 4 (Week 2): Patient will demonstrate functional problem solving for mildly complex tasks with supervision verbal cues.  Skilled Therapeutic Interventions:  Pt was seen in am to address dysphagia management and cognitive re- training. Pt was alert and seated upright in TIS WC. He was agreeable for ST session. Upon SLP arrival pt reporting mild nausea. Assigned nurse in and out during this session and administering morning meds. Pt consumed thin liquid via cup edge with x1 strong cough observed out of ~ 5-6 trials. SLP suspecting cough likely 2/2 larger sip. No anterior spillage noted however, his nurse reported onset of anterior spillage with liquids on previous date. He was agreeable to consume yogurt brought in by nurse. He administered all trials with immediate anterior spillage to left side and decreased awareness. He further presented with brief bolus holding before initiating manipulation and transit. Pt consumed 2 trials before cessation. Pt additionally presents with a decline of speech intelligibility from previous ST session with this SLP. Throughout session, SLP simultaneously addressing speech intelligibility during other task. SLP challenged pt in use of increasing speech  intensity, over articulating, and separating words. Pt ~60-70% intelligible in phrases with ability to compensate given verbal cues however, carryover was poor and he warranted consistent verbal cues across session. Pt's speech mostly limited to phrases this date with with few instances of speaking in complete sentences. In final minutes of session, SLP also addressed problem solving through challenging pt to identify solutions to problems presented visually. He identified problems and solutions with 67% acc and 75% acc indep which improved with min A. In missed opportunities, SLP provided correct response and rationale. Pt was left seated upright in TIS WC with call button within reach and chair alarm active. SLP to continue POC.  Pain Pain Assessment Pain Scale: 0-10 Pain Score: 8  Pain Type: Acute pain Pain Location: Hip Pain Orientation: Left Pain Descriptors / Indicators: Sharp Pain Onset: On-going Pain Intervention(s): Medication (See eMAR)  Therapy/Group: Individual Therapy  Renaee Munda 08/01/2022, 10:51 AM

## 2022-08-01 NOTE — Progress Notes (Signed)
Patient had 2 episodes of vomiting this Am, Yellow , thin emesis. Pt given Zofran PO. Pt has not had a bowel movement in 2 days; however, he refuses miralax and senna . Education was given last pm about bowel regimen maintainace. pt insist on Suppository only if he doesn't have BM by today will pass on to oncoming nurse.

## 2022-08-02 ENCOUNTER — Ambulatory Visit: Payer: BC Managed Care – PPO | Admitting: Infectious Disease

## 2022-08-02 DIAGNOSIS — A812 Progressive multifocal leukoencephalopathy: Secondary | ICD-10-CM | POA: Diagnosis not present

## 2022-08-02 LAB — COMPREHENSIVE METABOLIC PANEL
ALT: 38 U/L (ref 0–44)
AST: 27 U/L (ref 15–41)
Albumin: 3.4 g/dL — ABNORMAL LOW (ref 3.5–5.0)
Alkaline Phosphatase: 63 U/L (ref 38–126)
Anion gap: 9 (ref 5–15)
BUN: 16 mg/dL (ref 6–20)
CO2: 24 mmol/L (ref 22–32)
Calcium: 9.1 mg/dL (ref 8.9–10.3)
Chloride: 100 mmol/L (ref 98–111)
Creatinine, Ser: 0.9 mg/dL (ref 0.61–1.24)
GFR, Estimated: >60 mL/min (ref >60)
Glucose, Bld: 98 mg/dL (ref 70–99)
Potassium: 4.1 mmol/L (ref 3.5–5.1)
Sodium: 133 mmol/L — ABNORMAL LOW (ref 135–145)
Total Bilirubin: 0.7 mg/dL (ref 0.3–1.2)
Total Protein: 6.9 g/dL (ref 6.5–8.1)

## 2022-08-02 LAB — CBC WITH DIFFERENTIAL/PLATELET
Abs Immature Granulocytes: 0.6 10*3/uL — ABNORMAL HIGH (ref 0.00–0.07)
Basophils Absolute: 0.1 10*3/uL (ref 0.0–0.1)
Basophils Relative: 1 %
Eosinophils Absolute: 0.1 10*3/uL (ref 0.0–0.5)
Eosinophils Relative: 2 %
HCT: 37.6 % — ABNORMAL LOW (ref 39.0–52.0)
Hemoglobin: 12.9 g/dL — ABNORMAL LOW (ref 13.0–17.0)
Immature Granulocytes: 11 %
Lymphocytes Relative: 28 %
Lymphs Abs: 1.6 10*3/uL (ref 0.7–4.0)
MCH: 30.1 pg (ref 26.0–34.0)
MCHC: 34.3 g/dL (ref 30.0–36.0)
MCV: 87.6 fL (ref 80.0–100.0)
Monocytes Absolute: 0.5 10*3/uL (ref 0.1–1.0)
Monocytes Relative: 8 %
Neutro Abs: 2.9 10*3/uL (ref 1.7–7.7)
Neutrophils Relative %: 50 %
Platelets: 164 10*3/uL (ref 150–400)
RBC: 4.29 MIL/uL (ref 4.22–5.81)
RDW: 13.9 % (ref 11.5–15.5)
WBC: 5.6 10*3/uL (ref 4.0–10.5)
nRBC: 0 % (ref 0.0–0.2)

## 2022-08-02 LAB — AMMONIA: Ammonia: 12 umol/L (ref 9–35)

## 2022-08-02 MED ORDER — SODIUM CHLORIDE 0.9 % IV SOLN: INTRAVENOUS | Status: DC

## 2022-08-02 NOTE — Progress Notes (Signed)
Occupational Therapy Session Note  Patient Details  Name: Jose Lamb MRN: [id number] Date of Birth: 10/20/1960  Today's Date: 08/02/2022 OT Individual Time: 4010-2725 OT Individual Time Calculation (min): 70 min    Short Term Goals: Week 2:  OT Short Term Goal 1 (Week 2): Pt will complete LB dressing with min A using LRAD OT Short Term Goal 2 (Week 2): Pt will complete toileting tasks with mod A OT Short Term Goal 3 (Week 2): Pt will complete stand pivot transfers with min A    Skilled Therapeutic Interventions/Progress Updates:    Pt received lying supine in bed.  C/o pain in R hip. Pain rating 8 out 10. Nurse made aware they pt requesting tylenol.  Pt stated he is feeling somewhat better. Pt presenting with brighter affect today. Agreeable to bathing lying in bed.  Pt able to complete UB/LB bathing with Mod A. [name] shows improvement with functional movement. Pt able to log roll from left to right with Mod A.  Supine to EOB completed with Max A +2. Sit<>stand completed with Max A +2 patient able to pull up pants with Mod A.  Stedy used for bed to wheelchair transfer Max A +2.  Self care at sink level with Min A while sitting in power wheelchair. Max verbal cues needed for posture for finding midline. Pt required assistance with using powered wheelchair and is still unsafe to use independently at this point.  Pt breakfast tray delivered and set-up provided for meal.  OTS provided full supervision during meal. Pt ate around 25 % of meal this am.  DO entered room to perform evaluation and was speaking with wife on the phone. Pt left in room sitting in powered wheelchair, chair alarm activated and call light within reach, all needs met.  Therapy Documentation Precautions:  Precautions Precautions: Fall Precaution Comments: Lt hemi, poor sitting/standing balance Restrictions Weight Bearing Restrictions: No     Therapy/Group: Individual Therapy  [name] 08/02/2022, 7:25 AM

## 2022-08-02 NOTE — Progress Notes (Signed)
Speech Language Pathology Daily Session Note  Patient Details  Name: Jose Lamb MRN: [id number] Date of Birth: 01-01-1961  Today's Date: 08/02/2022 SLP Individual Time: 4098-1191 SLP Individual Time Calculation (min): 60 min  Short Term Goals: Week 2: SLP Short Term Goal 1 (Week 2): Patient will consume current diet with Mod I for use of swallowing compensatory strategies. SLP Short Term Goal 2 (Week 2): Patient will consume trials of thin liquids via STRAW without overt s/s of aspiration over 2 sessions prior to upgrade. SLP Short Term Goal 3 (Week 2): Patient will utilize speech intelligibility strategies at the sentence level to achieve ~90% intelligibility with supervision verbal cues. SLP Short Term Goal 4 (Week 2): Patient will demonstrate functional problem solving for mildly complex tasks with supervision verbal cues.  Skilled Therapeutic Interventions:   Pt greeted in his wheelchair. His wife and a friend were initially present, though left upon SLP arrival. Prior to initation of tx tasks, pt requested to use the urinal. SLP and 2 nurses assisted pt w/ urinal and transfer back to bed. He required maxA cues to remain at midline 2* strong lean to the L/L neglect. Once back in bed, pt was able to maintain midline posture w/ the assist of a pillow. Thin liquid PO trials completed to ensure continued success after recent decline in function. No overt s/s of airway invasion noted across 5 sips of thin liquids via cup. Continue with thin liquids at this time. He benefited from [facility] verbal cues to utilize slow rate, over articulation, and pacing and maintain 80% speech intelligibility at sentence level. He also completed a mildly complex structured word finding task (generative naming 3 items) w/ minA cues for abstract thinking/word finding. Left in bed w/ bed alarm set. Call light and belongings within reach. Recommend cont ST per POC.   Pain  Initially reported L hip pain 2* positioning in  chair, though no pain reported once back in bed.   Therapy/Group: Individual Therapy  Pati Gallo 08/02/2022, 4:22 PM

## 2022-08-02 NOTE — Progress Notes (Signed)
PROGRESS NOTE   Subjective/Complaints:  No emesis recorded since yesterday AM Vitals stable,  POs minimal  Patient much improved affect and alertness today, L hemineglect significantly reduced. States he is feeling much better. Discussed improving PO intakes today, aim to come off IV fluids tomorrow; patient in agreement.   ROS:  + LLE weakness/spasticity -imrpoved + Balance issues - ongoing + Constipation - improved + Groin rash/wounds - resolved + Bilateral groin pain - ongoing + emesis/nasuea - improved  Objective:   DG CHEST PORT 1 VIEW  Result Date: 08/01/2022 CLINICAL DATA:  Altered mental status, vomiting EXAM: PORTABLE CHEST 1 VIEW COMPARISON:  Previous studies including the examination of 07/18/2022 FINDINGS: Transverse diameter of heart is increased. There are no signs of pulmonary edema. Small linear densities are seen in left lower lung field suggesting scarring or subsegmental atelectasis. There is no focal pulmonary consolidation. There is no significant pleural effusion or pneumothorax. IMPRESSION: Small linear densities in the left lower lung field may suggest scarring or subsegmental atelectasis. There are no signs of alveolar pulmonary edema or focal pulmonary consolidation. Electronically Signed   By: Ernie Avena M.D.   On: 08/01/2022 15:31   DG Abd 1 View  Result Date: 08/01/2022 CLINICAL DATA:  Vomiting EXAM: ABDOMEN - 1 VIEW COMPARISON:  None Available. FINDINGS: Bowel gas pattern is nonspecific. Small to moderate amount of stool is seen in colon. No abnormal masses are seen. There is 5 mm calcific density overlying the left kidney. Kidneys are partly obscured by bowel contents. Degenerative changes are noted in lumbar spine. IMPRESSION: Nonspecific bowel gas pattern. Possible 5 mm left renal calculus. Lumbar spondylosis. Electronically Signed   By: Ernie Avena M.D.   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 CONTRAST:  9mL GADAVIST GADOBUTROL 1 MMOL/ML IV SOLN COMPARISON:  MR head 07/07/22 FINDINGS: Brain: Negative for an acute infarct. No hemorrhage. No hydrocephalus. No extra-axial fluid collection. Redemonstrated are multifocal regions of cortical and subcortical T2/FLAIR hyperintense signal abnormality in the bilateral parietal lobes, bilateral insular regions, right thalamocapsular region right superior colliculus, in the periventricular region of the pons. There is no evidence of abnormal contrast enhancement. Enlarged and partially empty sella Vascular: Normal flow voids. Skull and upper cervical spine: Normal marrow signal. Likely moderate spinal canal narrowing at C3-C4. Sinuses/Orbits: No middle ear or mastoid effusion. Paranasal sinuses are clear. Orbits are unremarkable. 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 See prior report for more detailed characterization. IMPRESSION: 1. No significant change from prior exam.  There is 2. Redemonstration of multifocal regions of cortical and subcortical T2/FLAIR hyperintense lesions in the bilateral parietal lobes, bilateral insular regions, right thalamocapsular region, right superior colliculus, and periventricular region of the pons. No evidence of abnormal contrast enhancement. Given appearance and patient's immunocompromised status, findings could be seen setting of PML or nonspecific encephalitis. 3. Moderate spinal canal  narrowing at C3-C4. Electronically Signed   By: Lorenza Cambridge M.D.   On: 07/31/2022 16:07   Recent Labs    07/31/22 0544 08/02/22 0557  WBC 6.0 5.6  HGB 14.9 12.9*   HCT 43.3 37.6*  PLT 138* 164     Recent Labs    08/01/22 1539 08/02/22 0557  NA 132* 133*  K 4.5 4.1  CL 97* 100  CO2 25 24  GLUCOSE 102* 98  BUN 20 16  CREATININE 0.99 0.90  CALCIUM 9.5 9.1      Intake/Output Summary (Last 24 hours) at 08/02/2022 1610 Last data filed at 08/02/2022 0540 Gross per 24 hour  Intake 180 ml  Output 200 ml  Net -20 ml      Pressure Injury 07/13/22 Sacrum Medial Stage 2 -  Partial thickness loss of dermis presenting as a shallow open injury with a red, pink wound bed without slough. Skin Tear Intergluteal Cleft (Active)  07/13/22 1130  Location: Sacrum  Location Orientation: Medial  Staging: Stage 2 -  Partial thickness loss of dermis presenting as a shallow open injury with a red, pink wound bed without slough.  Wound Description (Comments): Skin Tear Intergluteal Cleft  Present on Admission: Yes    Physical Exam: Vital Signs Blood pressure 134/77, pulse 75, temperature 97.9 F (36.6 C), temperature source Oral, resp. rate 18, height 5\' 11"  (1.803 m), weight 92.1 kg, SpO2 95 %.  Gen: no distress, normal appearing.  Sitting up in bedside chair.  Awake. HEENT: oral mucosa pink and moist, NCAT, right ear hearing improved. EOMI.  Cardio: Irregular rhythm, regular rate.  No murmurs, rubs, gallops. Chest: normal effort, normal rate of breathing.  Clear to auscultation bilaterally. Abd: soft, non-distended, nontender.  Positive bowel sounds. Ext: no edema  Skin: C/D/I. No apparent lesions.  + Stage 2 to sacrum - from 6/7 admission + Skin tears to penis - healing  MSK:      No apparent deformity.      Strength: 5/5 BL UE. + MILD left hemineglect  - MUCH improved, can attend with minimal effort MAS 1 hip flexors, MAS 1 knee extensors; MAS 1 PF, Mas 2 finger flexors, MAS 2 wrist flexors, MAS 1 elbow flexor, MAS 1 shoulder adductor - improved  Neurologic exam:  Cognition: AAO x 3,   + Delayed recall - improved + Delayed motor initiation  - ongoing + Moderate cognitive deficit - ongoing  Language: Fluent, No substitutions or neoglisms. Improved dysarthria.  Insight: Good insight into current condition.  Mood: Flat affect.  Sensation: Mildly decreased in left upper and lower extremity CN: 2-12 grossly intact.  Mild facial droop apparent when smiling.       Assessment/Plan: 1. Functional deficits which require 3+ hours per day of interdisciplinary therapy in a comprehensive inpatient rehab setting. Physiatrist is providing close team supervision and 24 hour management of active medical problems listed below. Physiatrist and rehab team continue to assess barriers to discharge/monitor patient progress toward functional and medical goals  Care Tool:  Bathing    Body parts bathed by patient: Right arm, Left arm, Chest, Abdomen, Front perineal area, Face   Body parts bathed by helper: Buttocks, Right upper leg, Left upper leg, Right lower leg, Left lower leg     Bathing assist Assist Level: Moderate Assistance - Patient 50 - 74%     Upper Body Dressing/Undressing Upper body dressing   What is the patient wearing?: Pull over shirt    Upper body assist  Assist Level: Moderate Assistance - Patient 50 - 74%    Lower Body Dressing/Undressing Lower body dressing      What is the patient wearing?: Pants     Lower body assist Assist for lower body dressing: Dependent - Patient 0%     Toileting Toileting    Toileting assist Assist for toileting: Maximal Assistance - Patient 25 - 49%     Transfers Chair/bed transfer  Transfers assist  Chair/bed transfer activity did not occur: Safety/medical concerns  Chair/bed transfer assist level: Maximal Assistance - Patient 25 - 49%     Locomotion Ambulation   Ambulation assist      Assist level: 2 helpers Assistive device: Walker-rolling Max distance: 12'   Walk 10 feet activity   Assist     Assist level: 2 helpers Assistive device: Walker-rolling    Walk 50 feet activity   Assist Walk 50 feet with 2 turns activity did not occur: Safety/medical concerns         Walk 150 feet activity   Assist Walk 150 feet activity did not occur: Safety/medical concerns         Walk 10 feet on uneven surface  activity   Assist Walk 10 feet on uneven surfaces activity did not occur: Safety/medical concerns         Wheelchair     Assist Is the patient using a wheelchair?: Yes Type of Wheelchair: Manual    Wheelchair assist level: Minimal Assistance - Patient > 75% Max wheelchair distance: 57'    Wheelchair 50 feet with 2 turns activity    Assist        Assist Level: Minimal Assistance - Patient > 75%   Wheelchair 150 feet activity     Assist      Assist Level: Maximal Assistance - Patient 25 - 49%   Blood pressure 134/77, pulse 75, temperature 97.9 F (36.6 C), temperature source Oral, resp. rate 18, height 5\' 11"  (1.803 m), weight 92.1 kg, SpO2 95 %.  Medical Problem List and Plan: 1. 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 Pt with subsequent left greater than right weakness and sensory loss particularly involving the lower extremities.              -patient may shower             -ELOS/Goals: 12-16 days  - 08/09/22  DC date             - Continue CIR PT/OT/SLP   - Per family request, HIV/AIDs diagnosis to remain confidential between wife and patient; do not inform other family  - 6/18: Per wife needs intermittent SPV goals, not realistic given therapies. He does well with cueing. Max A with sitting balance, transfers, and standing. Requires a lot skilled, verbal, and tactile cues for transferring due to no carryover during and between sessions. LLE shooting out. Burden of care remains high; will work more on STS and transfers than higher-level dynamics. Per SLP on full diet working on speech intelligibility, will  do memory eval.  - 6/21: Updated wife on functional prognosis with PML, enforced need for ongoing support at home and anticipate some ongoing functional decline.  Must stay compliant with antiretroviral therapy.  Will discuss with team possibility of powered mobility, given her own functional deficits.   - 6/24: Worsening overall deficits in spasticity, vision, and nausea/vomitting over the weekend; expect progression of PML. MRI w/ and w/o brain  pending; Neurology consulted for recommentations  - 6/25: Further cognitive and physical decline, poor safety awareness, D3 diet, spasticity seems somewhat better today.  Awaiting a.m. labs, suspect worsening hyponatremia, start IV fluids 100 cc/h.  - 6/26: MUCH improved cognition/hemineglect with medication adjustments and sodium correction  2.  Antithrombotics: -DVT/anticoagulation:  Pharmaceutical: Eliquis             -antiplatelet therapy: none   3. 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  Scheduled Tylenol 1000 mg 3 times daily, add tramadol 50 mg twice daily as needed.   4. Mood/Behavior/Sleep: LCSW to evaluate and provide emotional support             -antipsychotic agents: n/a   5. Neuropsych/cognition: This patient is capable of making decisions on his own behalf.   - SLP eval   - Neuropsych eval 6/18: While the patient clearly had some cognitive issues most of these were noted having to do with patterns consistent with midbrain and subcortical dysfunction and dysregulation. Patient denied significant depression or anxiety and denied significant coping difficulties with his current hospitalization although he is anxious about how much she will be able to improve even with therapies.    - 6/21: Per wife, increasing lethargy, depression, and mood lability at home prior to admission.  Possibly signs of depression  secondary to cognitive injury and functional decline.  Initiate activating SSRI Lexapro 10 mg daily.  - 6/25: Discontinue Lexapro, tramadol due to potential serotonergic drug interaction and SIADH contributing to emesis, confusion.   - 6/26: Per wife, patient's daughter passed suddenly this AM; team aware, will provide support as needed, neuropsych informed   6. Skin/Wound Care: Routine skin care checks  - Sacral stage 2 - offloading with foam and bed mobility  - Penile wounds - keep dry and monitor   7. Fluids/Electrolytes/Nutrition: Routine Is and Os and follow-up chemistries             -carb modified diet   - 6/14: Eating 25-100% meals. SLP dysphagia screen d/t mild dysarthria today.  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 fib: continue Eliquis for PE  -HR controlled   15: DM-2; A1c = 6.6% on 04/10/2022; (no home meds listed)             -serum glucose range: 89-119             -continue carb modified diet  - No CBGs, monitor BID AC for now  - 6/16-17 very well-controlled; can consider DC if remain low with adequate PO   -  6/18: well controlled, POs 25-75%, DC CBG 6/19   - 6/26: Ketones in UA yesterday; BMP glucose remain <200; POs poor - working on POs today   16: Acute PE and right lower extremity DVT             -continue Eliquis -> 10 to 5 mg BID dosing  6/17   17: Obesity: BMI 32    18: Chest pain 6/11>>negative trop x 2; EKG non-acute             -attributed to PE versus musculoskeletal   - 6/14: Improved with lidoderm patch. Encourage incentive spirometry.  -6/16 reports this is improved today.   19. Constipation. Schedule Sennakot-S 1 tab QHS and add PRN Miralax daily 17 g.   -6/15 reports improved with BM yesterday   -6/16 denies feeling constipated   - LBM 6/23, large after at bedtime suppository;  - add Miralax 17g BID - refusing  20. Spasticity. Extensor tone in [name] and LLE per therapies. - 6/18:  added Baclofen 5 mg TID. Consider PRAFO.  - 6/19: Benefit with ongoing mild spasticity, increased to 10 mg twice daily - Continue 10 mg TID - 6/25: Increased baclofen to 15 mg TID   21: Penile wounds, suspected HSV.  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  Completed treatment with valacyclovir 1000 mg twice daily for 14 days. Discussed with pharmacy that treatmenrt has been completed  22. Emesis: scopolamine patch ordered, improved, will d/c -> resume 6/24; she endorses improvement but continues to require as needed Zofran, worsening emesis. -KUB without obstruction, severe constipation - scopalamine patch, Zofran PRN - No emesis recorded since 6/25 AM; did get IV yesterday afternoon - IVF as above; minimal POs last 2 days - 6/26: No PRNS today, continue Scopalamine patch and DC in AM if tolerating POs  23. Right ear decreased hearing: debrox ear drops ordered, improved, will d/c  24.  Encephalopathy/altered mental status  -Neurology consulted, MRI with and without yesterday without any worsening of PML, low suspicion for IRIS at this time but will monitor closely.  Appreciate their recommendations.  -Has remained afebrile, normotensive, without gross signs of infection; however immunocompromise due to AIDs.  Will get a urine with reflex to culture, chest x-ray, KUB today.  -DC tramadol Lexapro due to possible serotonergic effects + SIADH potential  causing altered mental status, worsening tone, and emesis.  -Labs yesterday significant for hyponatremia 130; placed on fluid restriction, pending labs today, start IV fluids 100 cc/h  -If no apparent identifiable cause, will consult ID for further recommendations   - 6/26: Much improved with sodium increase as above; continue current Tx, BMP in AM, transition to PO fluids and salt tabs in AM LOS: 14 days A FACE TO FACE EVALUATION WAS PERFORMED  Angelina Sheriff 08/02/2022, 7:28 AM

## 2022-08-02 NOTE — Progress Notes (Signed)
Physical Therapy Session Note  Patient Details  Name: Jose Lamb MRN: [id number] Date of Birth: 23-Dec-1960  Today's Date: 08/02/2022 PT Individual Time: 1300-1415 PT Individual Time Calculation (min): 75 min   Short Term Goals: Week 2:  PT Short Term Goal 1 (Week 2): Pt will perform bed mobility consistently with minA. PT Short Term Goal 2 (Week 2): Pt will perform bed to chair transfer consistently with minA. PT Short Term Goal 3 (Week 2): Pt will ambulate x25' with modA and LRAD. PT Short Term Goal 4 (Week 2): Pt will complete x3 steps with BHRs and modA.  Skilled Therapeutic Interventions/Progress Updates:     Pt received semi reclined in bed and agrees to therapy. Reports that he is feeling much better than yesterday, but still reporting pain in Lt hip. Pt provides rest breaks as needed to manage pain. Pt perform supine to sit with maxA, including heavy facilitation of midline orientation upon sitting, with pt demonstrating significant Lt lateral lean, as well as posterior drift. Pt is able to correct balance and posture with cues and PT facilitating Rt lateral sidelean on Rt elbow, eventually only needing minA/modA for sitting balance. Pt performs sit to stand with Stedy with maxA, and requires +2 assistance for safe transfer from bed to [facility] due to poor trunk control and Lt lateral lean. Pt then drives power WC from room to main gym, requiring frequent manual cueing to prevent collisions with objects in Lt visual field, with pt demonstrating increased Rt sided gaze preference and Lt inattention relative to previous session with this therapist. Due to pt's lack of safety awareness with power WC, PT recommends that pt transition to non-powered mobility at this time and pt is agreeable. PT provides pt with Liberty chair to allow for increased independence but also with dumping feature and contoured backrest for trunk support. Pt performs squat pivot from power WC to mat table on Rt with maxA and  cues for anterior weight shift, head-hips relationship. Pt participates in NMR for sitting balance on mat in short sitting with mirror for visual feedback. Pt requires modA for majority of time performing static sitting, with significant difficulty maintaining neutral cervical spine or level gaze. PT positioned behind pt on theraball and provides verbal and tactile cues for midline orientation and neutral posture. PT provides pt with 2lb bar and pt performs bilateral arm raises holding onto bar with both hands, to promote increased Lt sided attention and with emphasis on utilizing gaze for assistance with motor control. Pt appears nearly unable to look Lt of midline, instead demonstrating consistent Rt sided and unfocused gaze. Of note, pt's daughter passed away this AM, and pt appeared withdrawn for much of session.   Pt performs stand pivot from mat>WC>bed with maxA and cues for anterior weight shift, powering up, PT blocking Lt knee, and providing assistance for lateral weight shifting. MaxA for sit to supine. Left with all needs within reach.   Therapy Documentation Precautions:  Precautions Precautions: Fall Precaution Comments: Lt hemi, poor sitting/standing balance Restrictions Weight Bearing Restrictions: No   Therapy/Group: Individual Therapy  Beau Fanny, PT, DPT 08/02/2022, 4:28 PM

## 2022-08-02 NOTE — Progress Notes (Signed)
Occupational Therapy Weekly Progress Note  Patient Details  Name: Jose Lamb MRN: [id number] Date of Birth: 03-Mar-1960  Beginning of progress report period: July 25, 2022 End of progress report period: August 02, 2022   Patient has met 0 of 3 short term goals. This week in CIR has been difficult for [name] and complicated by functional and neurological changes including worsening L hemi, speech more slurred and sitting balance worsening. Medical team is aware and workup pending, with slight improvement today. Pt requiring Max A for ADL tasks overall and requiring max- max +2 for ADL transfers. Family education has been initiated with his wife but will need to be be ongoing. Therapy team is awaiting decisions on moving forward given condition and prognosis, so will await this decision before changing OT POC.    Patient continues to demonstrate the following deficits: muscle weakness, impaired timing and sequencing, abnormal tone, unbalanced muscle activation, motor apraxia, decreased coordination, and decreased motor planning, decreased visual motor skills, decreased midline orientation, decreased attention to left, left side neglect, and decreased motor planning, decreased awareness, decreased problem solving, decreased safety awareness, and delayed processing, central origin, and decreased sitting balance, decreased standing balance, decreased postural control, hemiplegia, and decreased balance strategies and therefore will continue to benefit from skilled OT intervention to enhance overall performance with BADL.  Patient not progressing toward long term goals.  See goal revision..  Plan of care revisions: TBD.  OT Short Term Goals Week 2:  OT Short Term Goal 1 (Week 2): Pt will complete LB dressing with min A using LRAD OT Short Term Goal 1 - Progress (Week 2): Not met OT Short Term Goal 2 (Week 2): Pt will complete toileting tasks with mod A OT Short Term Goal 2 - Progress (Week 2): Not  met OT Short Term Goal 3 (Week 2): Pt will complete stand pivot transfers with min A OT Short Term Goal 3 - Progress (Week 2): Not met Week 3:  OT Short Term Goal 1 (Week 3): Pt will complete slide board transfer with Mod A OT Short Term Goal 2 (Week 3): Pt will complete bed mobility rolling L and R with Min A OT Short Term Goal 3 (Week 3): Pt will don pants supine with Mod A        [name] 08/02/2022, 12:18 PM

## 2022-08-03 DIAGNOSIS — A812 Progressive multifocal leukoencephalopathy: Secondary | ICD-10-CM | POA: Diagnosis not present

## 2022-08-03 DIAGNOSIS — L89152 Pressure ulcer of sacral region, stage 2: Secondary | ICD-10-CM | POA: Diagnosis not present

## 2022-08-03 DIAGNOSIS — R32 Unspecified urinary incontinence: Secondary | ICD-10-CM

## 2022-08-03 DIAGNOSIS — H9191 Unspecified hearing loss, right ear: Secondary | ICD-10-CM

## 2022-08-03 DIAGNOSIS — M25552 Pain in left hip: Secondary | ICD-10-CM

## 2022-08-03 LAB — BASIC METABOLIC PANEL
Anion gap: 9 (ref 5–15)
BUN: 12 mg/dL (ref 6–20)
CO2: 23 mmol/L (ref 22–32)
Calcium: 9 mg/dL (ref 8.9–10.3)
Chloride: 103 mmol/L (ref 98–111)
Creatinine, Ser: 0.86 mg/dL (ref 0.61–1.24)
GFR, Estimated: >60 mL/min (ref >60)
Glucose, Bld: 97 mg/dL (ref 70–99)
Potassium: 3.6 mmol/L (ref 3.5–5.1)
Sodium: 135 mmol/L (ref 135–145)

## 2022-08-03 LAB — HEMOGLOBIN AND HEMATOCRIT, BLOOD
HCT: 37.8 % — ABNORMAL LOW (ref 39.0–52.0)
Hemoglobin: 12.7 g/dL — ABNORMAL LOW (ref 13.0–17.0)

## 2022-08-03 MED ORDER — SODIUM CHLORIDE 1 G PO TABS
1.0000 g | ORAL_TABLET | Freq: Two times a day (BID) | ORAL | Status: DC
  Administered 2022-08-03 – 2022-08-06 (×7): 1 g via ORAL
  Filled 2022-08-03 (×6): qty 1

## 2022-08-03 MED ORDER — CARBAMIDE PEROXIDE 6.5 % OT SOLN
5.0000 [drp] | Freq: Two times a day (BID) | OTIC | Status: AC
  Administered 2022-08-03 – 2022-08-06 (×5): 5 [drp] via OTIC
  Filled 2022-08-03: qty 15

## 2022-08-03 NOTE — Progress Notes (Signed)
Speech Language Pathology Weekly Progress and Session Note  Patient Details  Name: Jose Lamb MRN: [id number] Date of Birth: [birth year]  Beginning of progress report period: July 28, 2022 End of progress report period: August 03, 2022  Today's Date: 08/03/2022 SLP Individual Time: 1301-1402 SLP Individual Time Calculation (min): 61 min  Short Term Goals: Week 2: SLP Short Term Goal 1 (Week 2): Patient will consume current diet with Mod I for use of swallowing compensatory strategies. SLP Short Term Goal 1 - Progress (Week 2): Revised due to lack of progress SLP Short Term Goal 2 (Week 2): Patient will consume trials of thin liquids via STRAW without overt s/s of aspiration over 2 sessions prior to upgrade. SLP Short Term Goal 2 - Progress (Week 2): Discontinued (comment) (discontinued due to progression of diagnosis) SLP Short Term Goal 3 (Week 2): Patient will utilize speech intelligibility strategies at the sentence level to achieve ~90% intelligibility with supervision verbal cues. SLP Short Term Goal 3 - Progress (Week 2): Revised due to lack of progress SLP Short Term Goal 4 (Week 2): Patient will demonstrate functional problem solving for mildly complex tasks with supervision verbal cues. SLP Short Term Goal 4 - Progress (Week 2): Revised due to lack of progress    New Short Term Goals: Week 3: SLP Short Term Goal 1 (Week 3): Patient will consume current diet with supervision for use of swallowing compensatory strategies. SLP Short Term Goal 2 (Week 3): Patient will utilize speech intelligibility strategies at the sentence level to achieve ~90% intelligibility with min A verbal cues. SLP Short Term Goal 3 (Week 3): Patient will demonstrate functional problem solving for basic tasks with supervision verbal cues. SLP Short Term Goal 4 (Week 3): Patient will recall two compensatory memory strategies with verbal min cues.  Weekly Progress Updates: Patient has made minimal progress and  has met 0 of 4 STG's this reporting period due to functional decline and progression of diagnosis. Currently, the patient continues to require mod A for use of speech intelligibility strategies at the sentence level. Patient is consuming dysphagia three textures with thin liquids with minimal verbal cues for use of swallowing compensatory strategies. Patient also requires min/mod verbal cues when completing functional problem solving activities and carryover of functional information.Pt/family education ongoing. Patient would benefit from continued skilled SLP intervention to maximize speech intelligibility and cognitive function in order to improve his functional independence prior to d/c.   Intensity: Minumum of 1-2 x/day, 30 to 90 minutes Frequency: 1 to 3 out of 7 days Duration/Length of Stay: TBD due to potential SNF placement Treatment/Interventions: Cognitive remediation/compensation;Cueing hierarchy;Dysphagia/aspiration precaution training;Environmental controls;Functional tasks;Internal/external aids;Patient/family education;Speech/Language facilitation;Therapeutic Activities  Daily Session  Skilled Therapeutic Interventions: Skilled treatment session focused on speech intelligibility and problem solving. SLP facilitated the session by providing mod verbal cues during conversational tasks with patient. Patient recalled 2/3 speech intelligibility strategies independently, but required mod verbal cues during conversation to utilize them. Patient with reduced intelligibility this date without visual articulatory cues. During problem solving tasks, the patient was able to correctly identify problems and solutions in photographs 85% of the time with minimal verbal cues. Patient was left in bed with bed alarm set and call light within reach. Patient continues to be participatory in activities and states he is consistently attempting to utilize intelligibility strategies during conversation. Patient  would benefit from continuation of ST services focusing on speech intelligibility in conversation, functional problem solving and dysphagia.     Pain Pain Assessment Faces  Pain Scale: No hurt  Therapy/Group: Individual Therapy  Concepcion Gillott M.A., CF-SLP 08/03/2022, 3:38 PM

## 2022-08-03 NOTE — Progress Notes (Signed)
Occupational Therapy Session Note  Patient Details  Name: Glenn Gullickson MRN: [id number] Date of Birth: 1960-04-29  Today's Date: 08/03/2022 OT Individual Time: 6578-4696 OT Individual Time Calculation (min): 30 min    Short Term Goals: Week 3:  OT Short Term Goal 1 (Week 3): Pt will complete slide board transfer with Mod A OT Short Term Goal 2 (Week 3): Pt will complete bed mobility rolling L and R with Min A OT Short Term Goal 3 (Week 3): Pt will don pants supine with Mod A  Skilled Therapeutic Interventions/Progress Updates:    Pt lying supine in bed.  No c/o pain.  Pt receptive to OT session.  LB dressing lying supine in bed with bridging completed with Max A.  Supine <>EOB Max +2.  Transfer from EOB<>wheelchair with Max A +2 for safety with sitting balance.  Pt agreeable to attend therapeutic dance activity.  HOH assist to integrate [name] into dance movements.  Pt actively engaging in program.  Pt left in dayroom for next treatment session with physical therapy.    Therapy Documentation Precautions:  Precautions Precautions: Fall Precaution Comments: Lt hemi, poor sitting/standing balance Restrictions Weight Bearing Restrictions: No    Therapy/Group: Individual Therapy  [name] 08/03/2022, 3:27 PM

## 2022-08-03 NOTE — Progress Notes (Signed)
Physical Therapy Session Note  Patient Details  Name: Jose Lamb MRN: [id number] Date of Birth: 29-Oct-1960  Today's Date: 08/03/2022 PT Individual Time: 1001-1045 and 5284-1324 PT Individual Time Calculation (min): 44 min and 55 min  Short Term Goals: Week 2:  PT Short Term Goal 1 (Week 2): Pt will perform bed mobility consistently with minA. PT Short Term Goal 2 (Week 2): Pt will perform bed to chair transfer consistently with minA. PT Short Term Goal 3 (Week 2): Pt will ambulate x25' with modA and LRAD. PT Short Term Goal 4 (Week 2): Pt will complete x3 steps with BHRs and modA.  Skilled Therapeutic Interventions/Progress Updates:     1st Session: Pt received supine in bed and agrees to therapy. Reports pain in Lt hip improving but still present. PT adjusts transfer method to focus transfers going toward Rt side to address pain, with pt verbalizing good management. Pt performs supine to sit with maxA/totalA and cues for body mechanics, hand placement, and sequencing. Pt performs stand pivot transfer from bed to [facility] with maxA and PT blocking Lt knee and providing heavy facilitation of forward weight shift and powering up. WC transport to gym. Pt performs stand pivot to mat table with same assistance. Session focused on sitting balance and Lt hemibody NMR with assistance of rec therapist who is present for session. Pt cued to utilize mirror for visual feedback. Pt initially requires modA/maxA for static sitting balance with heavy Lt sided lean, but is able to improve with cue to lean onto Rt forearm in sideleaning. Pt able to maintain this position with CGA overall. Pt returns to midline with PT positioned behind pt on theraball and rec therapist in front of pt, providing external cues for pt to reach toward with RUE and [name]. Pt able to initiate movement with [name] and initially able to reach to eye level with [name], but with fatigue and repeated reps begins to have difficulty lifting [name]. PT provides  cues for body mechanics, maintaining neutral cervical spine, increasing trunk extension, and preventing pt from pushing to the Lt with RUE. Pt performs slideboard transfer back to [facility] with modA +2 and cues for initiation and sequencing. WC transport back to room. Stand pivot to bed with maxA. MaxA for sit to supine with cues for sequencing. Left supine with all needs within reach.   2nd Session: Pt handed off by OT in daryoom. Pt agrees to therapy. Reports pain in Lt hip. PT provides rest breaks and repositioning to manage pain. Pt then participates in dance activity with other patients for community reintegration as well as endurance training, tasked with moving upper and lower extremities rhythmically in coordination with music and visual stimuli. PT provides cues for posture and utilization of Lt hemibody.  WC transport to main gym. Pt participates in transfer training in parallel bars for functional strengthening. Pt performs x2 reps of sit to stand requiring heavy maxA and cues for hand placement, with PT blocking Lt knee and manual facilitating forward weight shift and powering up. Pt has very forward flexed posture through trunk and cervical spine in standing and it unable to significantly improve posture, despite max cueing and manual assistance. Pt requires extended seated rest breaks between bouts.  WC transport to room. Stand pivot to bed with maxA. MaxA/totalA for sit to supine. Pt left with all needs within reach.   Therapy Documentation Precautions:  Precautions Precautions: Fall Precaution Comments: Lt hemi, poor sitting/standing balance Restrictions Weight Bearing Restrictions: No  Therapy/Group: Individual Therapy  Beau Fanny, PT, DPT 08/03/2022, 4:56 PM

## 2022-08-03 NOTE — Progress Notes (Addendum)
PROGRESS NOTE   Subjective/Complaints: No as needed Zofran used over the last day.  DC scopolamine patch. Vitals stable, sodium now back to 135.  DC IV fluids, initiate salt tabs twice daily. Therapy notes appear patient's improvements yesterday were intermittent, occasional severe worsening hemineglect, especially with PT.  Continues to be unsafe for use of power wheelchair. Patient withdrawn most of yesterday's afternoon due to daughter passing away  Spoke with patient and his wife today, both agreeable to palliative care consult for goals of care discussion.  Patient initially denies any complaints, concerns today, then later endorses decreased hearing in his right ear.  ROS:  + LLE weakness/spasticity -imrpoved + Balance issues - ongoing + L hip pain - ongoing + emesis/nasuea - improved + Decreased hearing right ear-recurrent  Objective:   DG CHEST PORT 1 VIEW  Result Date: 08/01/2022 CLINICAL DATA:  Altered mental status, vomiting EXAM: PORTABLE CHEST 1 VIEW COMPARISON:  Previous studies including the examination of 07/18/2022 FINDINGS: Transverse diameter of heart is increased. There are no signs of pulmonary edema. Small linear densities are seen in left lower lung field suggesting scarring or subsegmental atelectasis. There is no focal pulmonary consolidation. There is no significant pleural effusion or pneumothorax. IMPRESSION: Small linear densities in the left lower lung field may suggest scarring or subsegmental atelectasis. There are no signs of alveolar pulmonary edema or focal pulmonary consolidation. Electronically Signed   By: Ernie Avena M.D.   On: 08/01/2022 15:31   DG Abd 1 View  Result Date: 08/01/2022 CLINICAL DATA:  Vomiting EXAM: ABDOMEN - 1 VIEW COMPARISON:  None Available. FINDINGS: Bowel gas pattern is nonspecific. Small to moderate amount of stool is seen in colon. No abnormal masses are seen. There  is 5 mm calcific density overlying the left kidney. Kidneys are partly obscured by bowel contents. Degenerative changes are noted in lumbar spine. IMPRESSION: Nonspecific bowel gas pattern. Possible 5 mm left renal calculus. Lumbar spondylosis. Electronically Signed   By: Ernie Avena M.D.   On: 08/01/2022 15:29   Recent Labs    08/02/22 0557 08/03/22 0611  WBC 5.6  --   HGB 12.9* 12.7*  HCT 37.6* 37.8*  PLT 164  --      Recent Labs    08/02/22 0557 08/03/22 0611  NA 133* 135  K 4.1 3.6  CL 100 103  CO2 24 23  GLUCOSE 98 97  BUN 16 12  CREATININE 0.90 0.86  CALCIUM 9.1 9.0      Intake/Output Summary (Last 24 hours) at 08/03/2022 0839 Last data filed at 08/03/2022 0815 Gross per 24 hour  Intake 388 ml  Output 553 ml  Net -165 ml      Pressure Injury 07/13/22 Sacrum Medial Stage 2 -  Partial thickness loss of dermis presenting as a shallow open injury with a red, pink wound bed without slough. Skin Tear Intergluteal Cleft (Active)  07/13/22 1130  Location: Sacrum  Location Orientation: Medial  Staging: Stage 2 -  Partial thickness loss of dermis presenting as a shallow open injury with a red, pink wound bed without slough.  Wound Description (Comments): Skin Tear Intergluteal Cleft  Present on Admission: Yes  Physical Exam: Vital Signs Blood pressure 118/63, pulse 75, temperature 98.5 F (36.9 C), temperature source Oral, resp. rate 16, height 5\' 11"  (1.803 m), weight 100 kg, SpO2 99 %.  Gen: no distress, normal appearing.  Sitting in wheelchair, participating in therapy gym. HEENT: oral mucosa pink and moist, NCAT, EOMI. increased cerumen on otoscopic exam in right ear compared to left, however no effacement of tympanic membrane. Cardio: regular rhythm, regular rate.  No murmurs, rubs, gallops. Chest: normal effort, normal rate of breathing.  Clear to auscultation bilaterally. Abd: soft, non-distended, nontender.  Positive bowel sounds. Ext: no  edema  Skin: C/D/I. No apparent lesions.   + IV, c/d/i + Stage 2 to sacrum - from 6/7 admission - improving  MSK:      No apparent deformity.      Strength: 5/5 BL UE. + MILD left hemineglect  - MUCH improved, can attend with minimal effort MAS 1 hip flexors, MAS 1 knee extensors; MAS 1 PF, Mas 1 finger flexors, MAS 1 wrist flexors, MAS 1 elbow flexor, MAS 1 shoulder adductor - improved  Neurologic exam:  Cognition: AAO x 3,   + Left hemineglect -somewhat worse today than yesterday, however overall much improved  + Delayed recall - improved + Delayed motor initiation - ongoing + Moderate cognitive deficit - ongoing  Language: Fluent, No substitutions or neoglisms. Improved dysarthria.  Insight: Good insight into current condition.  Mood: Flat affect.  Sensation: Mildly decreased in left upper and lower extremity CN: 2-12 grossly intact.  Mild facial droop apparent when smiling.       Assessment/Plan: 1. Functional deficits which require 3+ hours per day of interdisciplinary therapy in a comprehensive inpatient rehab setting. Physiatrist is providing close team supervision and 24 hour management of active medical problems listed below. Physiatrist and rehab team continue to assess barriers to discharge/monitor patient progress toward functional and medical goals  Care Tool:  Bathing    Body parts bathed by patient: Right arm, Left arm, Chest, Abdomen, Front perineal area, Face   Body parts bathed by helper: Buttocks, Right upper leg, Left upper leg, Right lower leg, Left lower leg     Bathing assist Assist Level: Moderate Assistance - Patient 50 - 74%     Upper Body Dressing/Undressing Upper body dressing   What is the patient wearing?: Pull over shirt    Upper body assist Assist Level: Moderate Assistance - Patient 50 - 74%    Lower Body Dressing/Undressing Lower body dressing      What is the patient wearing?: Pants     Lower body assist Assist for lower  body dressing: Dependent - Patient 0%     Toileting Toileting    Toileting assist Assist for toileting: Maximal Assistance - Patient 25 - 49%     Transfers Chair/bed transfer  Transfers assist  Chair/bed transfer activity did not occur: Safety/medical concerns  Chair/bed transfer assist level: Maximal Assistance - Patient 25 - 49%     Locomotion Ambulation   Ambulation assist      Assist level: 2 helpers Assistive device: Walker-rolling Max distance: 12'   Walk 10 feet activity   Assist     Assist level: 2 helpers Assistive device: Walker-rolling   Walk 50 feet activity   Assist Walk 50 feet with 2 turns activity did not occur: Safety/medical concerns         Walk 150 feet activity   Assist Walk 150 feet activity did not occur: Safety/medical concerns  Walk 10 feet on uneven surface  activity   Assist Walk 10 feet on uneven surfaces activity did not occur: Safety/medical concerns         Wheelchair     Assist Is the patient using a wheelchair?: Yes Type of Wheelchair: Manual    Wheelchair assist level: Minimal Assistance - Patient > 75% Max wheelchair distance: 16'    Wheelchair 50 feet with 2 turns activity    Assist        Assist Level: Minimal Assistance - Patient > 75%   Wheelchair 150 feet activity     Assist      Assist Level: Maximal Assistance - Patient 25 - 49%   Blood pressure 118/63, pulse 75, temperature 98.5 F (36.9 C), temperature source Oral, resp. rate 16, height 5\' 11"  (1.803 m), weight 100 kg, SpO2 99 %.  Medical Problem List and Plan: 1. 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 Pt with subsequent left greater than right weakness and sensory loss particularly involving the lower extremities.              -patient may shower             -ELOS/Goals: 12-16 days  - 08/09/22  DC date             -  Continue CIR PT/OT/SLP   - Per family request, HIV/AIDs diagnosis to remain confidential between wife and patient; do not inform other family  - 6/18: Per wife needs intermittent SPV goals, not realistic given therapies. He does well with cueing. Max A with sitting balance, transfers, and standing. Requires a lot skilled, verbal, and tactile cues for transferring due to no carryover during and between sessions. LLE shooting out. Burden of care remains high; will work more on STS and transfers than higher-level dynamics. Per SLP on full diet working on speech intelligibility, will do memory eval.  - 6/21: Updated wife on functional prognosis with PML, enforced need for ongoing support at home and anticipate some ongoing functional decline.  Must stay compliant with antiretroviral therapy.  Will discuss with team possibility of powered mobility, given her own functional deficits.   - 6/24: Worsening overall deficits in spasticity, vision, and nausea/vomitting over the weekend; expect progression of PML. MRI w/ and w/o brain pending; Neurology consulted for recommentations  - 6/25: Further cognitive and physical decline, poor safety awareness, D3 diet, spasticity seems somewhat better today.  Awaiting a.m. labs, suspect worsening hyponatremia, start IV fluids 100 cc/h.  - 6/26: MUCH improved cognition/hemineglect with medication adjustments and sodium correction   - 6/28: Per teams, will be heavy assist at home, unsure if family will be able to accommodate safely.  Patient and family extremely motivated for discharge home.  Had discussion with wife, patient; both agreeable to palliative care consult for goals of care and dispo planning discussion.  2.  Antithrombotics: -DVT/anticoagulation:  Pharmaceutical: Eliquis             -antiplatelet therapy: none   3. Pain Management/spasticity: Tylenol as needed   - 6/13 R hip neuropathy - lidocaine patch  -6/21: With increasing mobility, pain in bilateral  groins, likely joint pain.  Scheduled Tylenol 1000 mg 3 times daily, add tramadol 50 mg twice daily as needed.   4. Mood/Behavior/Sleep: LCSW to evaluate and provide emotional support             -antipsychotic agents: n/a   5.  Neuropsych/cognition: This patient is capable of making decisions on his own behalf.   - SLP eval   - Neuropsych eval 6/18: While the patient clearly had some cognitive issues most of these were noted having to do with patterns consistent with midbrain and subcortical dysfunction and dysregulation. Patient denied significant depression or anxiety and denied significant coping difficulties with his current hospitalization although he is anxious about how much she will be able to improve even with therapies.    - 6/21: Per wife, increasing lethargy, depression, and mood lability at home prior to admission.  Possibly signs of depression secondary to cognitive injury and functional decline.  Initiate activating SSRI Lexapro 10 mg daily.  - 6/25: Discontinue Lexapro, tramadol due to potential serotonergic drug interaction and SIADH contributing to emesis, confusion.   - 6/26: Per wife, patient's daughter passed suddenly this AM; team aware, will provide support as needed, neuropsych informed - appreciate recommendations   6. Skin/Wound Care: Routine skin care checks  - Sacral stage 2 - offloading with foam and bed mobility  - Penile wounds - keep dry and monitor   7. Fluids/Electrolytes/Nutrition: Routine Is and Os and follow-up chemistries             -carb modified diet   - 6/14: Eating 25-100% meals. SLP dysphagia screen d/t mild dysarthria today.  -6/15  30- 100% intake, continue to monitor   -6/16 Ensure max daily ordered   - 6/24: Na 130; fluid restriction -> 132 w/ IVF -> 133-> 135 6/27   - 6/27: Discontinue IV fluid, transition to salt tabs 1 g twice daily.  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 fib: continue Eliquis for PE  -HR controlled   15: DM-2; A1c = 6.6% on 04/10/2022; (no home meds listed)             -serum glucose range: 89-119             -continue carb modified diet  - No CBGs, monitor BID AC for now  - 6/16-17 very well-controlled; can consider DC if remain low with adequate PO   - 6/18: well controlled, POs 25-75%, DC CBG 6/19   - 6/26: Ketones in UA yesterday; BMP glucose remain <200; POs poor - working on POs today  -6/27: Feels much improved, BMP glucose normal, no further monitoring   16: Acute PE and right lower extremity DVT             -continue Eliquis -> 10 to 5 mg BID dosing 6/17   17: Obesity: BMI 32    18: Chest pain 6/11>>negative trop x 2; EKG non-acute             -attributed to PE versus musculoskeletal   - 6/14: Improved with lidoderm patch. Encourage incentive spirometry.  -6/16 reports this is improved today.   19. Constipation. Schedule Sennakot-S 1 tab  QHS and add PRN Miralax daily 17 g.   -6/15 reports improved with BM yesterday   -6/16 denies feeling constipated   - LBM 6/23, large after at bedtime suppository - add Miralax 17g BID - refusing  20. Spasticity. Extensor tone in [name] and LLE per therapies. - 6/18:  added Baclofen 5 mg TID. Consider PRAFO.  - 6/19: Benefit with ongoing mild spasticity, increased to 10 mg twice daily - Continue 10 mg TID - 6/25: Increased baclofen to 15 mg TID  - 6/27: Tone improving, will decrease to 10 mg 3 times daily to reduce confounding confusion  21: Penile wounds, suspected HSV.  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  Completed  treatment with valacyclovir 1000 mg twice daily for 14 days. Discussed with pharmacy that treatmenrt has been completed  22. Emesis: scopolamine patch ordered, improved, will d/c -> resume 6/24; she endorses improvement but continues to require as needed Zofran, worsening emesis. -KUB without obstruction, severe constipation - scopalamine patch, Zofran PRN - No emesis recorded since 6/25 AM; did get IV yesterday afternoon - IVF as above; minimal POs last 2 days - 6/26: No PRNS today, continue Scopalamine patch and DC in AM if tolerating POs -6/27: No further as needed Zofran, DC scopolamine patch.  Tolerating p.o.'s better.  23. Right ear decreased hearing: debrox ear drops ordered, improved, will d/c  6/27; resume Debrox stops for 3 days  24.  Encephalopathy/altered mental status  -Neurology consulted, MRI with and without yesterday without any worsening of PML, low suspicion for IRIS at this time but will monitor closely.  Appreciate their recommendations.  -Has remained afebrile, normotensive, without gross signs of infection; however immunocompromise due to AIDs.  Will get a urine with reflex to culture, chest x-ray, KUB today.  -DC tramadol Lexapro due to possible serotonergic effects + SIADH potential causing altered mental status, worsening tone, and emesis.  -Labs yesterday significant for hyponatremia 130; placed on fluid restriction, pending labs today, start IV fluids 100 cc/h  -If no apparent identifiable cause, will consult ID for further recommendations   - 6/26: Much improved with sodium increase as above; continue current Tx, BMP in AM, transition to PO fluids and salt tabs in AM  25.  Urinary incontinence.  Documented 3 times overnight, otherwise rare and intermittent.  May be due to IV fluids, monitor  LOS: 15 days A FACE TO FACE EVALUATION WAS PERFORMED  Angelina Sheriff 08/03/2022, 8:39 AM

## 2022-08-03 NOTE — Plan of Care (Signed)
Goals downgraded due to functional decline:   Problem: RH Swallowing Goal: LTG Patient will consume least restrictive diet using compensatory strategies with assistance (SLP) Description: LTG:  Patient will consume least restrictive diet using compensatory strategies with assistance (SLP) Flowsheets (Taken 08/03/2022 1550) LTG: Pt Patient will consume least restrictive diet using compensatory strategies with assistance of (SLP): Supervision   Problem: RH Expression Communication Goal: LTG Patient will increase speech intelligibility (SLP) Description: LTG: Patient will increase speech intelligibility at word/phrase/conversation level with cues, % of the time (SLP) Flowsheets (Taken 08/03/2022 1550) LTG: Patient will increase speech intelligibility (SLP): Minimal Assistance - Patient > 75% Level: (sentence) --   Problem: RH Problem Solving Goal: LTG Patient will demonstrate problem solving for (SLP) Description: LTG:  Patient will demonstrate problem solving for basic/complex daily situations with cues  (SLP) Flowsheets (Taken 08/03/2022 1550) LTG: Patient will demonstrate problem solving for (SLP): Basic daily situations LTG Patient will demonstrate problem solving for: Supervision

## 2022-08-04 DIAGNOSIS — F02B3 Dementia in other diseases classified elsewhere, moderate, with mood disturbance: Secondary | ICD-10-CM | POA: Diagnosis not present

## 2022-08-04 DIAGNOSIS — A812 Progressive multifocal leukoencephalopathy: Secondary | ICD-10-CM | POA: Diagnosis not present

## 2022-08-04 MED ORDER — OXYCODONE HCL 5 MG PO TABS
5.0000 mg | ORAL_TABLET | Freq: Two times a day (BID) | ORAL | Status: DC | PRN
  Administered 2022-08-08 – 2022-08-10 (×4): 5 mg via ORAL
  Filled 2022-08-04 (×4): qty 1

## 2022-08-04 MED ORDER — BACLOFEN 10 MG PO TABS
10.0000 mg | ORAL_TABLET | Freq: Three times a day (TID) | ORAL | Status: DC
  Administered 2022-08-04 – 2022-08-07 (×9): 10 mg via ORAL
  Filled 2022-08-04 (×9): qty 1

## 2022-08-04 NOTE — Progress Notes (Signed)
Physical Therapy Session Note  Patient Details  Name: Jose Lamb MRN: [id number] Date of Birth: [birth year]  Today's Date: 08/04/2022 PT Individual Time: 4098-1191 PT Individual Time Calculation (min): 54 min   Short Term Goals: Week 2:  PT Short Term Goal 1 (Week 2): Pt will perform bed mobility consistently with minA. PT Short Term Goal 2 (Week 2): Pt will perform bed to chair transfer consistently with minA. PT Short Term Goal 3 (Week 2): Pt will ambulate x25' with modA and LRAD. PT Short Term Goal 4 (Week 2): Pt will complete x3 steps with BHRs and modA.  Skilled Therapeutic Interventions/Progress Updates:     Pt received seated in WC asleep with neck flexed and head resting on chest. Pt awakens to verbal and tactile stimuli. Reports pain in Lt hip. Number not provided. PT provides rest breaks and repositioning to manage pain. WC transport to gym for time management. Pt performs sit to stand multiple times during session to high-low table, requiring heavy maxA. PT provides cues for anterior trunk lean to prepare for transfer, and cues for initiation and anterior weight shift with BLE extension. Pt has little to no activation through LLE during transfer and has heavy posterior and Lt lean. PT cues for hand placement and hip/trunk extension, with use of mirror for visual feedback. Pt tasked with retrieving cones in Lt visual field with RUE to promote Lt sided attention, weight shifting, standing balance, and activity tolerance. PT provides maxA for standing balance throughout, with Lt knee blocked and manual facilitation of trunk extension. Extended seated rest breaks required. Pt also practices lateral weight shifting to approximate close-to-neutral posture and center of gravity, but quickly returns to Lt sided lean and what appears to be Lt sided pushing. WC transport back to room. Stand pivot transfer to bed with maxA and totalA for sit to supine. Left semi reclined with alarm intact and all  needs within reach.   Therapy Documentation Precautions:  Precautions Precautions: Fall Precaution Comments: Lt hemi, poor sitting/standing balance Restrictions Weight Bearing Restrictions: No    Therapy/Group: Individual Therapy  Beau Fanny, PT, DPT 08/04/2022, 7:35 PM

## 2022-08-04 NOTE — Progress Notes (Signed)
Occupational Therapy Session Note  Patient Details  Name: Jose Lamb MRN: [id number] Date of Birth: 12-11-1960  Today's Date: 08/04/2022 OT Individual Time: 1350-1500 OT Individual Time Calculation (min): 70 min    Short Term Goals: Week 3:  OT Short Term Goal 1 (Week 3): Pt will complete slide board transfer with Mod A OT Short Term Goal 2 (Week 3): Pt will complete bed mobility rolling L and R with Min A OT Short Term Goal 3 (Week 3): Pt will don pants supine with Mod A  Skilled Therapeutic Interventions/Progress Updates:    Pt received supine with no c/o pain, agreeable to OT session, requesting to take shower. He demonstrated ongoing postural control deficits, motor planning deficits, and L lateral lean/inattention. His L hemi was slightly better today. Bed mobility with mod A to EOB. He required max +2 to maintain midline orientation with frequent lob L. He stood in the stedy with mod A +2, good initiation/power up. He required HEAVY assist to maintain midline perched in the stedy en route to the shower. Mod A +2 to stand and then lower to the [facility]. Mod cueing for postural control throughout shower. He was able to wash under the RUE with the [name] with min facilitation. Pt more distracted today overall as well. He washed LB with mod A for sitting balance support. He transferred back to the w/c with max A+2 via stedy. Max A to don a shirt, total A +2 to don pants sit <> stand. Pt completed oral care with min A seated at the sink. Pt was taken via w/c to the therapy gym for time management. He worked on bilateral integration with a dowel to improve [name] NMR and attention to the L side. He completed 3x10 repetitions and required only min facilitation at the [name]- much improved motor function. He returned to his room. Pt was left sitting up in the TIS with all needs met, chair alarm set, and call bell within reach.    Therapy Documentation Precautions:  Precautions Precautions: Fall Precaution  Comments: Lt hemi, poor sitting/standing balance Restrictions Weight Bearing Restrictions: No   Therapy/Group: Individual Therapy  Crissie Reese 08/04/2022, 10:40 AM

## 2022-08-04 NOTE — Progress Notes (Signed)
Speech Language Pathology Daily Session Note  Patient Details  Name: Jose Lamb MRN: [id number] Date of Birth: 1960-09-27  Today's Date: 08/04/2022 SLP Individual Time: 0725-0820 SLP Individual Time Calculation (min): 55 min  Short Term Goals: Week 3: SLP Short Term Goal 1 (Week 3): Patient will consume current diet with supervision for use of swallowing compensatory strategies. SLP Short Term Goal 2 (Week 3): Patient will utilize speech intelligibility strategies at the sentence level to achieve ~90% intelligibility with min A verbal cues. SLP Short Term Goal 3 (Week 3): Patient will demonstrate functional problem solving for basic tasks with supervision verbal cues. SLP Short Term Goal 4 (Week 3): Patient will recall two compensatory memory strategies with verbal min cues.  Skilled Therapeutic Interventions:   Pt seen for skilled SLP session to address dysphagia and speech goals.   Swallowing: Pt able to feed self Dys 3 breakfast tray given set up. Pt consumed ~40-50% of solids and 5-6 oz of thin liquids. Straw used during this session with no overt or subtle s/s of aspiration observed. Pt reported straw use is easier for him due to oral muscle weakness and anterior spillage that occurs with cup use. Safe swallow strategies for straw use reviewed with teach back method. Continue current diet with use of straws permitted. SLP to assess tolerance of straw use. Anticipate Dys 3 is pt's least restrictive solid consistency at this time.   Speech: Pt required min-mod cues during this session to effectively implement clear speech strategies. Conversational speech and running speech during structured tasks observed to range between 50-75%. Intelligibility increased to ~90% with use of strategies.   Pt left sitting upright in bed with call bell in reach and bed alarm set. RN present to give meds. Continue SLP PoC  Pain Pt reported coccyx pain. RN present to give meds.  Therapy/Group:  Individual Therapy  Ellery Plunk 08/04/2022, 8:23 AM

## 2022-08-04 NOTE — Progress Notes (Signed)
PROGRESS NOTE   Subjective/Complaints:  No acute complaints.  No events overnight.  Patient states that he is little bit tired after therapies this a.m. Last recorded bowel movement per nursing documentation 6/23, patient claims he had bowel movement in the evening 6-26. States hip pain is slightly improved today, but ongoing.  ROS:  + LLE weakness/spasticity -ongoing + Balance issues - ongoing + L hip pain - ongoing + Decreased hearing right ear-recurrent  Objective:   No results found. Recent Labs    08/02/22 0557 08/03/22 0611  WBC 5.6  --   HGB 12.9* 12.7*  HCT 37.6* 37.8*  PLT 164  --      Recent Labs    08/02/22 0557 08/03/22 0611  NA 133* 135  K 4.1 3.6  CL 100 103  CO2 24 23  GLUCOSE 98 97  BUN 16 12  CREATININE 0.90 0.86  CALCIUM 9.1 9.0      Intake/Output Summary (Last 24 hours) at 08/04/2022 0909 Last data filed at 08/04/2022 1610 Gross per 24 hour  Intake 442.5 ml  Output 225 ml  Net 217.5 ml          Physical Exam: Vital Signs Blood pressure 123/62, pulse 67, temperature 98 F (36.7 C), temperature source Oral, resp. rate 18, height 5\' 11"  (1.803 m), weight 100 kg, SpO2 97 %.  Gen: no distress, normal appearing.  Sitting up in bed, lethargic appearing HEENT: oral mucosa pink and moist, NCAT, EOMI.  Cardio: regular rhythm, regular rate.  No murmurs, rubs, gallops. Chest: normal effort, normal rate of breathing.  Clear to auscultation bilaterally. Abd: soft, non-distended, nontender.  Positive bowel sounds. Ext: no edema  Skin: C/D/I. No apparent lesions.    + IV, c/d/i + Stage 2 to sacrum   MSK:      No apparent deformity.      Strength: 5/5 BL UE. + MILD left hemineglect  - MUCH improved, can attend with minimal effort MAS 2 hip flexors, MAS 2 knee extensors; MAS 1 PF, Mas 1 finger flexors, MAS 1 wrist flexors, MAS 1 elbow flexor, MAS 1 shoulder adductor - mildly  increased  Neurologic exam:  Cognition: AAO x 3,   + Left hemineglect - intermittently worse/better, today can attend with significant stimulation + Delayed recall - ongoing + Delayed motor initiation - ongoing + Moderate cognitive deficit - ongoing  Language: Fluent, No substitutions or neoglisms. Mild dysarthria.  Insight: Good insight into current condition.  Mood: Flat affect.  Sensation:  decreased in left upper and lower extremity CN:+ L sensory loss L V1-3,  Mild facial droop apparent when smiling.       Assessment/Plan: 1. Functional deficits which require 3+ hours per day of interdisciplinary therapy in a comprehensive inpatient rehab setting. Physiatrist is providing close team supervision and 24 hour management of active medical problems listed below. Physiatrist and rehab team continue to assess barriers to discharge/monitor patient progress toward functional and medical goals  Care Tool:  Bathing    Body parts bathed by patient: Right arm, Left arm, Chest, Abdomen, Front perineal area, Face   Body parts bathed by helper: Buttocks, Right upper leg, Left upper leg,  Right lower leg, Left lower leg     Bathing assist Assist Level: Moderate Assistance - Patient 50 - 74%     Upper Body Dressing/Undressing Upper body dressing   What is the patient wearing?: Pull over shirt    Upper body assist Assist Level: Moderate Assistance - Patient 50 - 74%    Lower Body Dressing/Undressing Lower body dressing      What is the patient wearing?: Pants     Lower body assist Assist for lower body dressing: Dependent - Patient 0%     Toileting Toileting    Toileting assist Assist for toileting: Maximal Assistance - Patient 25 - 49%     Transfers Chair/bed transfer  Transfers assist  Chair/bed transfer activity did not occur: Safety/medical concerns  Chair/bed transfer assist level: Maximal Assistance - Patient 25 - 49%      Locomotion Ambulation   Ambulation assist      Assist level: 2 helpers Assistive device: Walker-rolling Max distance: 12'   Walk 10 feet activity   Assist     Assist level: 2 helpers Assistive device: Walker-rolling   Walk 50 feet activity   Assist Walk 50 feet with 2 turns activity did not occur: Safety/medical concerns         Walk 150 feet activity   Assist Walk 150 feet activity did not occur: Safety/medical concerns         Walk 10 feet on uneven surface  activity   Assist Walk 10 feet on uneven surfaces activity did not occur: Safety/medical concerns         Wheelchair     Assist Is the patient using a wheelchair?: Yes Type of Wheelchair: Manual    Wheelchair assist level: Minimal Assistance - Patient > 75% Max wheelchair distance: 74'    Wheelchair 50 feet with 2 turns activity    Assist        Assist Level: Minimal Assistance - Patient > 75%   Wheelchair 150 feet activity     Assist      Assist Level: Maximal Assistance - Patient 25 - 49%   Blood pressure 123/62, pulse 67, temperature 98 F (36.7 C), temperature source Oral, resp. rate 18, height 5\' 11"  (1.803 m), weight 100 kg, SpO2 97 %.  Medical Problem List and Plan: 1. 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 Pt with subsequent left greater than right weakness and sensory loss particularly involving the lower extremities.              -patient may shower             -ELOS/Goals: 12-16 days  - 08/09/22  DC date             - Continue CIR PT/OT/SLP   - Per family request, HIV/AIDs diagnosis to remain confidential between wife and patient; do not inform other family  - 6/18: Per wife needs intermittent SPV goals, not realistic given therapies. He does well with cueing. Max A with sitting balance, transfers, and standing. Requires a lot skilled, verbal, and tactile cues for  transferring due to no carryover during and between sessions. LLE shooting out. Burden of care remains high; will work more on STS and transfers than higher-level dynamics. Per SLP on full diet working on speech intelligibility, will do memory eval.  - 6/21: Updated wife on functional prognosis with PML, enforced need for ongoing support at home and anticipate some ongoing  functional decline.  Must stay compliant with antiretroviral therapy.  Will discuss with team possibility of powered mobility, given her own functional deficits.   - 6/24: Worsening overall deficits in spasticity, vision, and nausea/vomitting over the weekend; expect progression of PML. MRI w/ and w/o brain pending; Neurology consulted for recommentations  - 6/25: Further cognitive and physical decline, poor safety awareness, D3 diet, spasticity seems somewhat better today.  Awaiting a.m. labs, suspect worsening hyponatremia, start IV fluids 100 cc/h.  - 6/26: MUCH improved cognition/hemineglect with medication adjustments and sodium correction   - 6/27: Per teams, will be heavy assist at home, unsure if family will be able to accommodate safely.  Patient and family extremely motivated for discharge home.  Had discussion with wife, patient; both agreeable to palliative care consult for goals of care and dispo planning discussion.   - 6/28: Palliative consult pending  2.  Antithrombotics: -DVT/anticoagulation:  Pharmaceutical: Eliquis             -antiplatelet therapy: none   3. 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  Scheduled Tylenol 1000 mg 3 times daily, add tramadol 50 mg twice daily as needed.   - 6/28: D/t medication issues with tramadol and ongoing pain, added oxycodone 5 mg BID PRn   4. Mood/Behavior/Sleep: LCSW to evaluate and provide emotional support             -antipsychotic agents: n/a    5. Neuropsych/cognition: This patient is capable of making decisions on his own behalf.   - SLP eval   - Neuropsych eval 6/18: While the patient clearly had some cognitive issues most of these were noted having to do with patterns consistent with midbrain and subcortical dysfunction and dysregulation. Patient denied significant depression or anxiety and denied significant coping difficulties with his current hospitalization although he is anxious about how much she will be able to improve even with therapies.    - 6/21: Per wife, increasing lethargy, depression, and mood lability at home prior to admission.  Possibly signs of depression secondary to cognitive injury and functional decline.  Initiate activating SSRI Lexapro 10 mg daily.  - 6/25: Discontinue Lexapro, tramadol due to potential serotonergic drug interaction and SIADH contributing to emesis, confusion.   - 6/26: Per wife, patient's daughter passed suddenly this AM; team aware, will provide support as needed, neuropsych informed - appreciate recommendations  - 6/28: neuropsych saw patient, appreciate Dr. Marvetta Gibbons assistance in this patient's care   6. Skin/Wound Care: Routine skin care checks  - Sacral stage 2 - offloading with foam and bed mobility  - Penile wounds - keep dry and monitor   7. Fluids/Electrolytes/Nutrition: Routine Is and Os and follow-up chemistries             -carb modified diet   - 6/14: Eating 25-100% meals. SLP dysphagia screen d/t mild dysarthria today.  -6/15  30- 100% intake, continue to monitor   -6/16 Ensure max daily ordered   - 6/24: Na 130; fluid restriction -> 132 w/ IVF -> 133-> 135 6/27   - 6/27: Discontinue IV fluid, transition to salt tabs 1 g twice daily.  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 fib: continue Eliquis for PE  -HR controlled   15: DM-2; A1c = 6.6% on 04/10/2022; (no home meds listed)             -serum glucose range: 89-119             -continue carb modified diet  - No CBGs, monitor BID AC for now  - 6/16-17 very well-controlled; can consider DC if remain low with adequate PO   - 6/18: well controlled, POs 25-75%, DC CBG 6/19   - 6/26: Ketones in UA yesterday; BMP glucose remain <200; POs poor - working on POs today  -6/27: Feels much improved, BMP glucose normal, no further monitoring   16: Acute PE and right lower extremity DVT             -continue Eliquis -> 10 to 5 mg BID dosing 6/17   17: Obesity: BMI 32    18: Chest pain 6/11>>negative trop x 2; EKG non-acute             -attributed to PE versus musculoskeletal   - 6/14: Improved with lidoderm patch. Encourage incentive spirometry.  -6/16 reports this is improved today.   19. Constipation. Schedule Sennakot-S 1 tab QHS and add PRN Miralax daily 17 g.   -6/15 reports improved with BM yesterday   -6/16 denies feeling constipated - add Miralax 17g BID    - LBM 6/23, large after at bedtime suppository - patient reports LBM 6/26; monitor  20. Spasticity. Extensor tone in [name] and LLE per therapies. - 6/18:  added Baclofen 5 mg TID. Consider PRAFO.  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  Completed treatment with valacyclovir 1000 mg  twice daily for 14 days. Discussed with pharmacy that treatmenrt has been completed  22. Emesis: scopolamine patch ordered, improved, will d/c -> resume 6/24; she endorses improvement but continues to require as needed Zofran, worsening emesis. -KUB without obstruction, severe constipation - scopalamine patch, Zofran PRN - No emesis recorded since 6/25 AM; did get IV yesterday afternoon - IVF as above; minimal POs last 2 days - 6/26: No PRNS today, continue Scopalamine patch and DC in AM if tolerating POs -6/27: No further as needed Zofran, DC scopolamine patch.  Tolerating p.o.'s better.  23. Right ear decreased hearing: debrox ear drops ordered, improved, will d/c  6/27; resume Debrox stops for 3 days  24.  Encephalopathy/altered mental status  -Neurology consulted, MRI with and without yesterday without any worsening of PML, low suspicion for IRIS at this time but will monitor closely.  Appreciate their recommendations.  -Has remained afebrile, normotensive, without gross signs of infection; however immunocompromise due to AIDs.  Will get a urine with reflex to culture, chest x-ray, KUB today.  -DC tramadol Lexapro due to possible  serotonergic effects + SIADH potential causing altered mental status, worsening tone, and emesis.  -Labs yesterday significant for hyponatremia 130; placed on fluid restriction, pending labs today, start IV fluids 100 cc/h  -If no apparent identifiable cause, will consult ID for further recommendations   - 6/26: Much improved with sodium increase as above; continue current Tx, BMP in AM, transition to PO fluids and salt tabs in AM  25.  Urinary incontinence, intermittent. May be worsened due to IV fluids   -  UA negative, PVRs low, monitor  LOS: 16 days A FACE TO FACE EVALUATION WAS PERFORMED  Angelina Sheriff 08/04/2022, 9:09 AM

## 2022-08-04 NOTE — Progress Notes (Signed)
Notified of void of approximately 100 cc grossly bloody urine. He denies dysuria or abd pain. Had in and out cath two days ago and is likely source along with Eliquis. Will check UA.

## 2022-08-04 NOTE — Consult Note (Signed)
Neuropsychological Consultation [facility]   Patient:   Jose Lamb   DOB:   17-May-1960  MR Number:  [id number]  Location:  [facility] [address] CENTER [id number] [place] [address] Dept: [phone] Loc: [phone]           Date of Service:   08/04/2022  Start Time:   9 AM End Time:   10 AM  Provider/Observer:  Arley Phenix, Psy.D.       Clinical Neuropsychologist       Billing Code/Service: [phone]  Reason for Service:    Jose Lamb is a 62 year old male referred for neuropsychological consultation due to coping and adjustment issues and cognitive issues.  Patient had presented initially to [facility] on 07/05/2022 reporting history of multiple falls and a recent stroke.  Patient complained of overall soreness and weakness.  Only issues noted were significant possible urinary tract infection and hypokalemia.  Patient was placed on antibiotics and given potassium supplements and discharged home.  Patient Jose Lamb presented to emergency department at [facility] on 07/07/2022 continuing to complain of a 6-week history of left-sided illness.  Patient sent for MRI and neurology consulted.  MRI revealed abnormal T2 signal of the cortical/subcortical and right greater than left insular postcentral gyrus as well as splenium and body of corpus callosum and thalamus and other midbrain regions.  There was concern for infectious versus inflammatory process such as PML, autoimmune encephalitis, herpes simplex and enteritis.  Patient is HIV positive and had not been compliant with his antiretroviral drugs for some time.  Patient has past medical history of HIV infection, hypertension and diabetes.  His HIV disease is longstanding and he has not been on antiretroviral therapies for several years (2017).  Infectious disease was consulted with concern for PML based on MRI and history of  nonadherence to his medication regimen.  CSF was positive for herpes simplex virus test to and he was started on Aquaphor.  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  Patient had therapy evaluations completed and was admitted onto CIR for therapies due to right-sided weakness, sensory loss etc.  During today's clinical visit the patient was generally oriented and in a positive mood state.  Patient seen to have somewhat worse cognitive function today but he was oriented x 4 but very slowed information processing speed etc.  Patient was aware of ongoing diagnostic concerns around PML etc.  HPI for the current admission:    HPI: Jose Lamb is a 62 year old male presented to [facility] ED on 07/05/2022 reporting a history of multiple falls and a recent stroke.  Chief complaints were allover soreness and weakness.  Workup was significant for possible urinary tract infection and hypokalemia.  He was placed on antibiotics and given potassium supplement and discharged home.  He re-presented to the emergency department at [facility] on 07/07/2022 complaining of an approximately 6-week history of left-sided weakness.  Notes indicate the patient was sent there for MRI scanning.  Neurology was consulted and MRI of the brain revealed abnormal T2 signal of the cortical subcortical and right greater than left insular postcentral gyrus as well as splenium and body of corpus callosum and thalamus and midbrain.  This was concerning for infectious versus inflammatory process such as PML, autoimmune encephalitis, herpes simplex and enteritis.  Recommendations were for high-volume LP with labs,  MRI of the brain with contrast and admission to [facility].  The patient's pertinent past medical history includes HIV, hypertension and diabetes mellitus.  The patient's history of HIV disease is longstanding and he has not been on ART for several years going  back to 2017.  His most recent regimen was Descovy, darnunavir/ritonavir and etravirine.  Prior history of bilateral pulmonary embolism 2021, atrial fibrillation in the setting of hyperthyroidism.  The patient is not chronically anticoagulated.   Infectious disease consultation obtained by Dr. Earlene Plater on 6/01.  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  Given high-dose B12 daily. Imaging was also significant for bilateral parotid masses and ENT consultation placed for biopsy.  Ultrasound-guided aspiration of bilateral parotid cyst on 6/03 by Dr. Grace Isaac.  Negative for malignancy. He was admitted to [facility] on 6/07 and developed fever and tachypnea with tachycardia at approximately 8 pm. He was transferred back to acute bed and found to have extensive bilateral pulmonary emboli with moderate-to-large thrombus volume and evidence of right heart strain. Placed on Eliquis. Tolerating heart healthy diet. 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 Pt with subsequent left greater than right weakness and sensory loss particularly involving the lower extremities. Has cough.  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, with mood symptoms (HCC) 08/04/2022   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   Parotid gland enlargement 07/10/2022   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   AIDS (acquired immune deficiency syndrome) (HCC) 07/10/2022   HIV disease (HCC) 07/08/2022   Weakness 07/08/2022   Left-sided weakness 07/07/2022   Acute metabolic encephalopathy 04/06/2022   Sepsis due to pneumonia (HCC) 04/06/2022   Umbilical hernia without obstruction and without gangrene 12/16/2021   SBO (small bowel obstruction) (HCC) 12/14/2021   History of pulmonary embolus (PE) 12/14/2021   Hypokalemia 12/14/2021   CAP (community acquired pneumonia) 12/14/2021   Diabetes mellitus type 2 in obese 12/14/2021   GERD (gastroesophageal reflux disease) 12/14/2021   Bilateral pulmonary embolism (HCC) 09/19/2019   Acute respiratory failure with hypoxia (HCC)    Acute respiratory disease due to COVID-19 virus 09/15/2019   Pneumonia due to COVID-19 virus 09/15/2019   Ectatic aorta (HCC) 04/17/2018   Pre-diabetes 11/26/2014   Asthma 11/05/2014   Paroxysmal atrial fibrillation (HCC) 06/30/2014   Graves' disease 04/01/2014   Constipation 03/24/2014   Hypertension 02/06/2014   Moderate malnutrition (HCC) 02/06/2014   Peptic ulcer with perforation (HCC)    Duodenal ulcer perforation (HCC) 02/04/2014   HIV (human immunodeficiency  virus infection) (HCC) 02/04/2014   Perforated duodenal ulcer (HCC) 02/04/2014   Neuropathic pain 03/13/2013   Class 1 obesity 07/04/2012   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 his dress was Appropriate and he was Well Groomed and his manners were Appropriate to the situation.   his participation was indicative of Appropriate and Redirectable behaviors.  There were physical disabilities noted.  he displayed an appropriate level of cooperation and motivation.    Interactions:    Active Appropriate  Attention:   abnormal and attention span appeared shorter than expected for age  Memory:   within normal limits; recent and remote memory intact  Visuo-spatial:   not examined  Speech (Volume):  normal  Speech:   normal; normal  Thought Process:  Coherent and Relevant  Coherent and Directed  Though Content:  WNL; not suicidal and not homicidal  Orientation:   person, place, time/date, and situation  Judgment:   Fair  Planning:   Fair  Affect:    Appropriate  Mood:    Euthymic  Insight:   Fair  Intelligence:   normal  Psychiatric History:  No prior psychiatric history noted  Family Med/Psych History:  Family History  Problem Relation Age of Onset   Ulcers Mother    Heart Problems Mother    Colon cancer Neg Hx     Impression/DX:   Jose Lamb is a 62 year old male referred for neuropsychological consultation due to coping and adjustment issues and cognitive issues.  Patient had presented initially to [name] on 07/05/2022 reporting history of multiple falls and a recent stroke.  Patient complained of overall soreness and weakness.  Only issues noted were significant possible urinary tract infection and hypokalemia.  Patient was placed on antibiotics and given potassium supplements and discharged home.  Patient Jose Lamb presented to emergency department at [facility] on 07/07/2022 continuing to complain of a 6-week history of left-sided illness.  Patient sent for MRI and neurology consulted.  MRI revealed abnormal T2 signal of the cortical/subcortical and right greater than left insular postcentral gyrus as well as splenium and body of corpus callosum and thalamus and other midbrain regions.  There was concern for infectious versus  inflammatory process such as PML, autoimmune encephalitis, herpes simplex and enteritis.  Patient is HIV positive and had not been compliant with his antiretroviral drugs for some time.  Patient has past medical history of HIV infection, hypertension and diabetes.  His HIV disease is longstanding and he has not been on antiretroviral therapies for several years (2017).  Infectious disease was consulted with concern for PML based on MRI and history of nonadherence to his medication regimen.  CSF was positive for herpes simplex virus test to and he was started on Aquaphor.  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  Patient had therapy evaluations completed and was admitted onto CIR for therapies due to right-sided weakness, sensory loss etc.  Today we worked on coping and adjustment issues with the patient and reviewed changes in cognition.  Patient has had a recent visit with palliative care but I did not find a note for today's visit yet in his EMR.  The patient reports that he was comfortable with the visit.  The plan is for him to go home with his wife but it is  clear that he has significant neurological deficits and it will be challenging for the patient's wife to manage him on her own.   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         Electronically Signed   _______________________ Arley Phenix, Psy.D. Clinical Neuropsychologist

## 2022-08-05 DIAGNOSIS — Z7189 Other specified counseling: Secondary | ICD-10-CM

## 2022-08-05 DIAGNOSIS — A812 Progressive multifocal leukoencephalopathy: Secondary | ICD-10-CM | POA: Diagnosis not present

## 2022-08-05 DIAGNOSIS — Z515 Encounter for palliative care: Secondary | ICD-10-CM

## 2022-08-05 LAB — BASIC METABOLIC PANEL
Anion gap: 8 (ref 5–15)
BUN: 9 mg/dL (ref 6–20)
CO2: 24 mmol/L (ref 22–32)
Calcium: 9.3 mg/dL (ref 8.9–10.3)
Chloride: 103 mmol/L (ref 98–111)
Creatinine, Ser: 0.87 mg/dL (ref 0.61–1.24)
GFR, Estimated: >60 mL/min (ref >60)
Glucose, Bld: 99 mg/dL (ref 70–99)
Potassium: 3.6 mmol/L (ref 3.5–5.1)
Sodium: 135 mmol/L (ref 135–145)

## 2022-08-05 LAB — URINALYSIS, ROUTINE W REFLEX MICROSCOPIC
Bilirubin Urine: NEGATIVE
Glucose, UA: NEGATIVE mg/dL
Ketones, ur: NEGATIVE mg/dL
Leukocytes,Ua: NEGATIVE
Nitrite: NEGATIVE
Protein, ur: 30 mg/dL — AB
Specific Gravity, Urine: 1.009 (ref 1.005–1.030)
pH: 6 (ref 5.0–8.0)

## 2022-08-05 NOTE — Progress Notes (Signed)
Physical Therapy Weekly Progress Note  Patient Details  Name: Jose Lamb MRN: [id number] Date of Birth: 1960-12-22  Beginning of progress report period: July 27, 2022 End of progress report period: August 05, 2022  Today's Date: 08/05/2022 PT Missed Time: 60 Minutes Missed Time Reason: Patient fatigue  Patient has met 0 out of 4 short term goals. Pt has not made functional progress toward mobility goals in current reporting session, demonstrating increased Lt sided motor deficits, decreased trunk control and balance, and increased fatigue. PT has updated pt's discharged recommendation to SNF and palliative care has been consulted due to pt's functional decline. Pt will continue to benefit from skilled PT to optimize mobility and decrease caregiver burden of care.   Patient continues to demonstrate the following deficits muscle weakness, decreased cardiorespiratoy endurance, decreased coordination and decreased motor planning, decreased attention to left, and decreased sitting balance, decreased standing balance, decreased postural control, hemiplegia, and decreased balance strategies and therefore will continue to benefit from skilled PT intervention to increase functional independence with mobility.  Patient not progressing toward long term goals.  See goal revision..  Plan of care revisions: Goals downgraded due to lack of progress.  PT Short Term Goals Week 2:  PT Short Term Goal 1 (Week 2): Pt will perform bed mobility consistently with minA. PT Short Term Goal 1 - Progress (Week 2): Not progressing PT Short Term Goal 2 (Week 2): Pt will perform bed to chair transfer consistently with minA. PT Short Term Goal 2 - Progress (Week 2): Not progressing PT Short Term Goal 3 (Week 2): Pt will ambulate x25' with modA and LRAD. PT Short Term Goal 3 - Progress (Week 2): Not progressing PT Short Term Goal 4 (Week 2): Pt will complete x3 steps with BHRs and modA. PT Short Term Goal 4 - Progress  (Week 2): Not progressing Week 3:  PT Short Term Goal 1 (Week 3): STGs = LTGs  Skilled Therapeutic Interventions/Progress Updates:     Pt politely declines therapy at this time secondary to fatigue. PT will follow up as able.   Therapy Documentation Precautions:  Precautions Precautions: Fall Precaution Comments: Lt hemi, poor sitting/standing balance Restrictions Weight Bearing Restrictions: No   Therapy/Group: Individual Therapy  Beau Fanny 08/05/2022, 10:53 AM

## 2022-08-05 NOTE — Consult Note (Signed)
Palliative Care Consult Note                                  Date: 08/05/2022   Patient Name: Jose Lamb  DOB: 09-Apr-1960  MRN: [id number]  Age / Sex: 62 y.o., male  PCP: [name], [name] Referring Physician: Angelina Sheriff, DO  Reason for Consultation: Establishing goals of care  HPI/Patient Profile: 62 y.o. male  with past medical history of prior CVA, paroxysmal atrial fibrillation not on oral anticoagulation, asthma, HIV with noncompliance, and history of pulmonary embolism who presented to [facility] long ED admitted on 07/07/2022 with complaints of left-sided weakness x 6 weeks.  Associated with frequent falls.  MRI of the brain showed right-sided T2 Hyper-Tet signal primarily cortical/subcortical, in the right greater than left insula and postcentral gyrus.  Neurology and ID were consulted.  CSF positive for HSV 2 PCR.  Patient was thought likely to have PML in the setting of noncompliance with HIV medication.  He had improved and was transferred to CIR on 07/14/2022, but subsequently developed some chest discomfort.  He was found to have a large PE on CTA and was transferred back to [facility].   Past Medical History:  Diagnosis Date   Atrial fibrillation (HCC)    In the setting of hyperthyroidism   Bilateral pulmonary embolism [facility])    Diagnosed August 2021   Diabetes mellitus without complication (HCC)    Essential hypertension    HIV antibody positive (HCC)    Perforated duodenal ulcer (HCC) 2015   Contained without need for surgical intervention   Pneumonia due to COVID-19 virus    Diagnosed August 2021    Subjective:   I have reviewed medical records including progress notes, labs and imaging, received report from the team, and assessed the patient at bedside.   I met with patient and his wife [name] at bedside to discuss diagnosis, prognosis, GOC, EOL wishes, disposition, and options.  I introduced Palliative Medicine  as specialized medical care for people living with serious illness. It focuses on providing relief from the symptoms and stress of a serious illness.   We discussed patient's current illness and what it means in the larger context of his/her ongoing co-morbidities. Current clinical status was reviewed. Natural disease trajectory of *** was discussed.  Created space and opportunity for patient and family to explore thoughts and feelings regarding current medical situation.  Values and goals of care important to patient and family were attempted to be elicited.  A discussion was had today regarding advanced directives. Concepts specific to code status, artifical feeding and hydration, continued IV antibiotics and rehospitalization was had.  The MOST form was introduced and discussed.  Questions and concerns addressed. Patient/family encouraged to call with questions or concerns.     Life Review: ***  Functional Status: ***  Patient/Family Understanding of Illness: ***  Goals: ***  Additional Discussion:  I completed a MOST form today. The patient and family outlined their wishes for the following treatment decisions:  Cardiopulmonary Resuscitation: Do Not Attempt Resuscitation (DNR/No CPR)  Medical Interventions: Comfort Measures: Keep clean, warm, and dry. Use medication by any route, positioning, wound care, and other measures to relieve pain and suffering. Use oxygen, suction and manual treatment of airway obstruction as needed for comfort. Do not transfer to the hospital unless comfort needs cannot be met in current location.  Antibiotics: Determine use of limitation of  antibiotics when infection occurs  IV Fluids: IV fluids for a defined trial period  Feeding Tube: No feeding tube     Review of Systems  Objective:   Primary Diagnoses: Present on Admission:  PML (progressive multifocal leukoencephalopathy) (HCC)   Physical Exam  Vital Signs:  BP (!) 155/73 (BP  Location: Right Arm)   Pulse 69   Temp 98.5 F (36.9 C) (Oral)   Resp 18   Ht 5\' 11"  (1.803 m)   Wt 100 kg   SpO2 96%   BMI 30.75 kg/m   Palliative Assessment/Data: ***     Assessment & Plan:   SUMMARY OF RECOMMENDATIONS   Code status changed to DNR/DNI with comfort measures Continue current supportive interventions Goal of care is to return home and focus on quality of life Outpatient palliative referral to [facility]  Primary Decision Maker: PATIENT  Code Status/Advance Care Planning: {Palliative Code status:23503}  Symptom Management:  ***  Prognosis:  {Palliative Care Prognosis:23504}  Discharge Planning:  {Palliative dispostion:23505}   Discussed with: ***    Thank you for allowing [place] to participate in the care of Samul Dada   Time Total: ***  Greater than 50%  of this time was spent counseling and coordinating care related to the above assessment and plan.  Signed by: Sherlean Foot, NP Palliative Medicine Team  Team Phone # [phone]  For individual providers, please see AMION

## 2022-08-05 NOTE — Progress Notes (Signed)
PROGRESS NOTE   Subjective/Complaints:  L hip pain ongoing, however reports meds helping.  LBM yesterday Denies any acute issues.   ROS:   Pt denies SOB, abd pain, CP, N/V/C/D, and vision changes Except for HPI Objective:   No results found. Recent Labs    08/03/22 0611  HGB 12.7*  HCT 37.8*    Recent Labs    08/03/22 0611 08/05/22 0530  NA 135 135  K 3.6 3.6  CL 103 103  CO2 23 24  GLUCOSE 97 99  BUN 12 9  CREATININE 0.86 0.87  CALCIUM 9.0 9.3     Intake/Output Summary (Last 24 hours) at 08/05/2022 1525 Last data filed at 08/05/2022 1337 Gross per 24 hour  Intake 1317 ml  Output 1700 ml  Net -383 ml         Physical Exam: Vital Signs Blood pressure (!) 155/73, pulse 69, temperature 98.5 F (36.9 C), temperature source Oral, resp. rate 18, height 5\' 11"  (1.803 m), weight 100 kg, SpO2 96 %.    General:woke briefly- sleepy; NAD HENT:  oropharynx moist CV: regular rate and rhythm;  no JVD Pulmonary: CTA B/L; no W/R/R- good air movement GI: soft, NT, ND, (+)BS Psychiatric: very flat Neurological: sleepy- just woke up Skin: C/D/I. No apparent lesions.    + IV, c/d/i + Stage 2 to sacrum   MSK:      No apparent deformity.      Strength: 5/5 BL UE. + MILD left hemineglect  - MUCH improved, can attend with minimal effort MAS 2 hip flexors, MAS 2 knee extensors; MAS 1 PF, Mas 1 finger flexors, MAS 1 wrist flexors, MAS 1 elbow flexor, MAS 1 shoulder adductor - mildly increased  Neurologic exam:  Cognition: AAO x 3,   + Left hemineglect - intermittently worse/better, today can attend with significant stimulation + Delayed recall - ongoing + Delayed motor initiation - ongoing + Moderate cognitive deficit - ongoing  Language: Fluent, No substitutions or neoglisms. Mild dysarthria.  Insight: Good insight into current condition.  Mood: Flat affect.  Sensation:  decreased in left upper and lower  extremity CN:+ L sensory loss L V1-3,  Mild facial droop apparent when smiling.       Assessment/Plan: 1. Functional deficits which require 3+ hours per day of interdisciplinary therapy in a comprehensive inpatient rehab setting. Physiatrist is providing close team supervision and 24 hour management of active medical problems listed below. Physiatrist and rehab team continue to assess barriers to discharge/monitor patient progress toward functional and medical goals  Care Tool:  Bathing    Body parts bathed by patient: Left arm, Chest, Abdomen, Front perineal area, Face, Right upper leg, Left upper leg, Right lower leg, Left lower leg   Body parts bathed by helper: Buttocks, Right arm     Bathing assist Assist Level: Maximal Assistance - Patient 24 - 49%     Upper Body Dressing/Undressing Upper body dressing   What is the patient wearing?: Pull over shirt    Upper body assist Assist Level: Maximal Assistance - Patient 25 - 49%    Lower Body Dressing/Undressing Lower body dressing      What is  the patient wearing?: Pants     Lower body assist Assist for lower body dressing: 2 Helpers     Toileting Toileting    Toileting assist Assist for toileting: Total Assistance - Patient < 25%     Transfers Chair/bed transfer  Transfers assist  Chair/bed transfer activity did not occur: Safety/medical concerns  Chair/bed transfer assist level: 2 Helpers     Locomotion Ambulation   Ambulation assist      Assist level: 2 helpers Assistive device: Walker-rolling Max distance: 12'   Walk 10 feet activity   Assist     Assist level: 2 helpers Assistive device: Walker-rolling   Walk 50 feet activity   Assist Walk 50 feet with 2 turns activity did not occur: Safety/medical concerns         Walk 150 feet activity   Assist Walk 150 feet activity did not occur: Safety/medical concerns         Walk 10 feet on uneven surface  activity   Assist  Walk 10 feet on uneven surfaces activity did not occur: Safety/medical concerns         Wheelchair     Assist Is the patient using a wheelchair?: Yes Type of Wheelchair: Manual    Wheelchair assist level: Minimal Assistance - Patient > 75% Max wheelchair distance: 57'    Wheelchair 50 feet with 2 turns activity    Assist        Assist Level: Minimal Assistance - Patient > 75%   Wheelchair 150 feet activity     Assist      Assist Level: Maximal Assistance - Patient 25 - 49%   Blood pressure (!) 155/73, pulse 69, temperature 98.5 F (36.9 C), temperature source Oral, resp. rate 18, height 5\' 11"  (1.803 m), weight 100 kg, SpO2 96 %.  Medical Problem List and Plan: 1. 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 Pt with subsequent left greater than right weakness and sensory loss particularly involving the lower extremities.              -patient may shower             -ELOS/Goals: 12-16 days  - 08/09/22  DC date             - Continue CIR PT/OT/SLP   - Per family request, HIV/AIDs diagnosis to remain confidential between wife and patient; do not inform other family  - 6/18: Per wife needs intermittent SPV goals, not realistic given therapies. He does well with cueing. Max A with sitting balance, transfers, and standing. Requires a lot skilled, verbal, and tactile cues for transferring due to no carryover during and between sessions. LLE shooting out. Burden of care remains high; will work more on STS and transfers than higher-level dynamics. Per SLP on full diet working on speech intelligibility, will do memory eval.  - 6/21: Updated wife on functional prognosis with PML, enforced need for ongoing support at home and anticipate some ongoing functional decline.  Must stay compliant with antiretroviral therapy.  Will discuss with team possibility of powered mobility, given her own  functional deficits.   - 6/24: Worsening overall deficits in spasticity, vision, and nausea/vomitting over the weekend; expect progression of PML. MRI w/ and w/o brain pending; Neurology consulted for recommentations  - 6/25: Further cognitive and physical decline, poor safety awareness, D3 diet, spasticity seems somewhat better today.  Awaiting a.m. labs, suspect worsening hyponatremia, start IV  fluids 100 cc/h.  - 6/26: MUCH improved cognition/hemineglect with medication adjustments and sodium correction   - 6/27: Per teams, will be heavy assist at home, unsure if family will be able to accommodate safely.  Patient and family extremely motivated for discharge home.  Had discussion with wife, patient; both agreeable to palliative care consult for goals of care and dispo planning discussion.   - 6/28: Palliative consult pending  6/29- con't CIR 2.  Antithrombotics: -DVT/anticoagulation:  Pharmaceutical: Eliquis             -antiplatelet therapy: none   3. 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  Scheduled Tylenol 1000 mg 3 times daily, add tramadol 50 mg twice daily as needed.   - 6/28: D/t medication issues with tramadol and ongoing pain, added oxycodone 5 mg BID PRn   6/29- pt reports meds helping L hip pain 4. Mood/Behavior/Sleep: LCSW to evaluate and provide emotional support             -antipsychotic agents: n/a   5. Neuropsych/cognition: This patient is capable of making decisions on his own behalf.   - SLP eval   - Neuropsych eval 6/18: While the patient clearly had some cognitive issues most of these were noted having to do with patterns consistent with midbrain and subcortical dysfunction and dysregulation. Patient denied significant depression or anxiety and denied significant coping difficulties with his current hospitalization although he is anxious about  how much she will be able to improve even with therapies.    - 6/21: Per wife, increasing lethargy, depression, and mood lability at home prior to admission.  Possibly signs of depression secondary to cognitive injury and functional decline.  Initiate activating SSRI Lexapro 10 mg daily.  - 6/25: Discontinue Lexapro, tramadol due to potential serotonergic drug interaction and SIADH contributing to emesis, confusion.   - 6/26: Per wife, patient's daughter passed suddenly this AM; team aware, will provide support as needed, neuropsych informed - appreciate recommendations  - 6/28: neuropsych saw patient, appreciate Dr. Marvetta Gibbons assistance in this patient's care   6. Skin/Wound Care: Routine skin care checks  - Sacral stage 2 - offloading with foam and bed mobility  - Penile wounds - keep dry and monitor   7. Fluids/Electrolytes/Nutrition: Routine Is and Os and follow-up chemistries             -carb modified diet   - 6/14: Eating 25-100% meals. SLP dysphagia screen d/t mild dysarthria today.  -6/15  30- 100% intake, continue to monitor   -6/16 Ensure max daily ordered   - 6/24: Na 130; fluid restriction -> 132 w/ IVF -> 133-> 135 6/27   - 6/27: Discontinue IV fluid, transition to salt tabs 1 g twice daily.  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 fib: continue Eliquis for PE  -HR controlled   15: DM-2; A1c = 6.6% on 04/10/2022; (no home meds listed)             -serum glucose range: 89-119             -continue  carb modified diet  - No CBGs, monitor BID AC for now  - 6/16-17 very well-controlled; can consider DC if remain low with adequate PO   - 6/18: well controlled, POs 25-75%, DC CBG 6/19   - 6/26: Ketones in UA yesterday; BMP glucose remain <200; POs poor - working on POs today  -6/27: Feels much improved, BMP glucose normal, no further monitoring   16: Acute PE and right lower extremity DVT             -continue Eliquis -> 10 to 5 mg BID dosing 6/17   17: Obesity: BMI 32    18: Chest pain 6/11>>negative trop x 2; EKG non-acute             -attributed to PE versus musculoskeletal   - 6/14: Improved with lidoderm patch. Encourage incentive spirometry.  -6/16 reports this is improved today.   19. Constipation. Schedule Sennakot-S 1 tab QHS and add PRN Miralax daily 17 g.   -6/15 reports improved with BM yesterday   -6/16 denies feeling constipated - add Miralax 17g BID    - LBM 6/23, large after at bedtime suppository - patient reports LBM 6/26; monitor  6/29- LBM yesterday 20. Spasticity. Extensor tone in [name] and LLE per therapies. - 6/18:  added Baclofen 5 mg TID. Consider PRAFO.  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  Completed treatment with valacyclovir 1000 mg twice daily for 14 days. Discussed with pharmacy that treatmenrt has been completed  22. Emesis: scopolamine patch ordered, improved, will d/c -> resume 6/24; she endorses improvement but continues to require as needed Zofran, worsening emesis. -KUB without obstruction, severe constipation - scopalamine patch, Zofran PRN - No emesis recorded since 6/25 AM; did get IV yesterday afternoon - IVF as above;  minimal POs last 2 days - 6/26: No PRNS today, continue Scopalamine patch and DC in AM if tolerating POs -6/27: No further as needed Zofran, DC scopolamine patch.  Tolerating p.o.'s better.  6/29- pt denied nausea 23. Right ear decreased hearing: debrox ear drops ordered, improved, will d/c  6/27; resume Debrox stops for 3 days  24.  Encephalopathy/altered mental status  -Neurology consulted, MRI with and without yesterday without any worsening of PML, low suspicion for IRIS at this time but will monitor closely.  Appreciate their recommendations.  -Has remained afebrile, normotensive, without gross signs of infection; however immunocompromise due to AIDs.  Will get a urine with reflex to culture, chest x-ray, KUB today.  -DC tramadol Lexapro due to possible serotonergic effects + SIADH potential causing altered mental status, worsening tone, and emesis.  -Labs yesterday significant for hyponatremia 130; placed on fluid restriction, pending labs today, start IV fluids 100 cc/h  -If no apparent identifiable cause, will consult ID for further recommendations   - 6/26: Much improved with sodium increase  as above; continue current Tx, BMP in AM, transition to PO fluids and salt tabs in AM  6/29- Na 135 this AM 25.  Urinary incontinence, intermittent. May be worsened due to IV fluids   -  UA negative, PVRs low, monitor  6/29- rechecked due to hematuria- had some large Hb in U/A however is negative otherwise.    I spent a total of 35   minutes on total care today- >50% coordination of care- due to  Due to complex medical issues and review of all labs, vitals for last few days and compared, esp U/A, BP and Na.   LOS: 17 days A FACE TO FACE EVALUATION WAS PERFORMED  Smrithi Pigford 08/05/2022, 3:25 PM

## 2022-08-06 DIAGNOSIS — A812 Progressive multifocal leukoencephalopathy: Secondary | ICD-10-CM | POA: Diagnosis not present

## 2022-08-06 NOTE — Progress Notes (Signed)
PROGRESS NOTE   Subjective/Complaints:  Pt reports still having some L hip pain, but better this AM  Admits hasn't had BM in "days" but per pt and nurse, will take suppository this AM.    No additional issues ROS:   Pt denies SOB, abd pain, CP, N/V/C/D, and vision changes  Except for HPI Objective:   No results found. No results for input(s): "WBC", "HGB", "HCT", "PLT" in the last 72 hours.   Recent Labs    08/05/22 0530  NA 135  K 3.6  CL 103  CO2 24  GLUCOSE 99  BUN 9  CREATININE 0.87  CALCIUM 9.3     Intake/Output Summary (Last 24 hours) at 08/06/2022 1031 Last data filed at 08/06/2022 0800 Gross per 24 hour  Intake 595 ml  Output 900 ml  Net -305 ml         Physical Exam: Vital Signs Blood pressure (!) 127/96, pulse 82, temperature 98.3 F (36.8 C), resp. rate 15, height 5\' 11"  (1.803 m), weight 100 kg, SpO2 98 %.     General: awake, alert, appropriate,  sitting up eating breakfast rapidly; NAD HENT: conjugate gaze; oropharynx moist CV: regular rate and rhythm; no JVD Pulmonary: CTA B/L; no W/R/R- good air movement GI: soft, NT, ND, hypoactive Psychiatric: appropriate Neurological: alert- stable per nursing Skin: C/D/I. No apparent lesions.    + IV, c/d/i + Stage 2 to sacrum   MSK:      No apparent deformity.      Strength: 5/5 BL UE. + MILD left hemineglect  - MUCH improved, can attend with minimal effort MAS 2 hip flexors, MAS 2 knee extensors; MAS 1 PF, Mas 1 finger flexors, MAS 1 wrist flexors, MAS 1 elbow flexor, MAS 1 shoulder adductor - mildly increased  Neurologic exam:  Cognition: AAO x 3,   + Left hemineglect - intermittently worse/better, today can attend with significant stimulation + Delayed recall - ongoing + Delayed motor initiation - ongoing + Moderate cognitive deficit - ongoing  Language: Fluent, No substitutions or neoglisms. Mild dysarthria.  Insight: Good  insight into current condition.  Mood: Flat affect.  Sensation:  decreased in left upper and lower extremity CN:+ L sensory loss L V1-3,  Mild facial droop apparent when smiling.       Assessment/Plan: 1. Functional deficits which require 3+ hours per day of interdisciplinary therapy in a comprehensive inpatient rehab setting. Physiatrist is providing close team supervision and 24 hour management of active medical problems listed below. Physiatrist and rehab team continue to assess barriers to discharge/monitor patient progress toward functional and medical goals  Care Tool:  Bathing    Body parts bathed by patient: Left arm, Chest, Abdomen, Front perineal area, Face, Right upper leg, Left upper leg, Right lower leg, Left lower leg   Body parts bathed by helper: Buttocks, Right arm     Bathing assist Assist Level: Maximal Assistance - Patient 24 - 49%     Upper Body Dressing/Undressing Upper body dressing   What is the patient wearing?: Pull over shirt    Upper body assist Assist Level: Maximal Assistance - Patient 25 - 49%    Lower  Body Dressing/Undressing Lower body dressing      What is the patient wearing?: Pants     Lower body assist Assist for lower body dressing: 2 Helpers     Toileting Toileting    Toileting assist Assist for toileting: Total Assistance - Patient < 25%     Transfers Chair/bed transfer  Transfers assist  Chair/bed transfer activity did not occur: Safety/medical concerns  Chair/bed transfer assist level: 2 Helpers     Locomotion Ambulation   Ambulation assist      Assist level: 2 helpers Assistive device: Walker-rolling Max distance: 12'   Walk 10 feet activity   Assist     Assist level: 2 helpers Assistive device: Walker-rolling   Walk 50 feet activity   Assist Walk 50 feet with 2 turns activity did not occur: Safety/medical concerns         Walk 150 feet activity   Assist Walk 150 feet activity did not  occur: Safety/medical concerns         Walk 10 feet on uneven surface  activity   Assist Walk 10 feet on uneven surfaces activity did not occur: Safety/medical concerns         Wheelchair     Assist Is the patient using a wheelchair?: Yes Type of Wheelchair: Manual    Wheelchair assist level: Minimal Assistance - Patient > 75% Max wheelchair distance: 93'    Wheelchair 50 feet with 2 turns activity    Assist        Assist Level: Minimal Assistance - Patient > 75%   Wheelchair 150 feet activity     Assist      Assist Level: Maximal Assistance - Patient 25 - 49%   Blood pressure (!) 127/96, pulse 82, temperature 98.3 F (36.8 C), resp. rate 15, height 5\' 11"  (1.803 m), weight 100 kg, SpO2 98 %.  Medical Problem List and Plan: 1. 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 Pt with subsequent left greater than right weakness and sensory loss particularly involving the lower extremities.              -patient may shower             -ELOS/Goals: 12-16 days  - 08/09/22  DC date             - Continue CIR PT/OT/SLP   - Per family request, HIV/AIDs diagnosis to remain confidential between wife and patient; do not inform other family  - 6/18: Per wife needs intermittent SPV goals, not realistic given therapies. He does well with cueing. Max A with sitting balance, transfers, and standing. Requires a lot skilled, verbal, and tactile cues for transferring due to no carryover during and between sessions. LLE shooting out. Burden of care remains high; will work more on STS and transfers than higher-level dynamics. Per SLP on full diet working on speech intelligibility, will do memory eval.  - 6/21: Updated wife on functional prognosis with PML, enforced need for ongoing support at home and anticipate some ongoing functional decline.  Must stay compliant with antiretroviral therapy.   Will discuss with team possibility of powered mobility, given her own functional deficits.   - 6/24: Worsening overall deficits in spasticity, vision, and nausea/vomitting over the weekend; expect progression of PML. MRI w/ and w/o brain pending; Neurology consulted for recommentations  - 6/25: Further cognitive and physical decline, poor safety awareness, D3 diet, spasticity seems somewhat better today.  Awaiting a.m. labs, suspect worsening hyponatremia, start IV fluids 100 cc/h.  - 6/26: MUCH improved cognition/hemineglect with medication adjustments and sodium correction   - 6/27: Per teams, will be heavy assist at home, unsure if family will be able to accommodate safely.  Patient and family extremely motivated for discharge home.  Had discussion with wife, patient; both agreeable to palliative care consult for goals of care and dispo planning discussion.   - 6/28: Palliative consult pending  6/30- con't CIR- no therapy today, we think 2.  Antithrombotics: -DVT/anticoagulation:  Pharmaceutical: Eliquis             -antiplatelet therapy: none   3. 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  Scheduled Tylenol 1000 mg 3 times daily, add tramadol 50 mg twice daily as needed.   - 6/28: D/t medication issues with tramadol and ongoing pain, added oxycodone 5 mg BID PRn   6/29- pt reports meds helping L hip pain 4. Mood/Behavior/Sleep: LCSW to evaluate and provide emotional support             -antipsychotic agents: n/a   5. Neuropsych/cognition: This patient is capable of making decisions on his own behalf.   - SLP eval   - Neuropsych eval 6/18: While the patient clearly had some cognitive issues most of these were noted having to do with patterns consistent with midbrain and subcortical dysfunction and dysregulation. Patient denied significant depression or anxiety and  denied significant coping difficulties with his current hospitalization although he is anxious about how much she will be able to improve even with therapies.    - 6/21: Per wife, increasing lethargy, depression, and mood lability at home prior to admission.  Possibly signs of depression secondary to cognitive injury and functional decline.  Initiate activating SSRI Lexapro 10 mg daily.  - 6/25: Discontinue Lexapro, tramadol due to potential serotonergic drug interaction and SIADH contributing to emesis, confusion.   - 6/26: Per wife, patient's daughter passed suddenly this AM; team aware, will provide support as needed, neuropsych informed - appreciate recommendations  - 6/28: neuropsych saw patient, appreciate Dr. Marvetta Gibbons assistance in this patient's care   6. Skin/Wound Care: Routine skin care checks  - Sacral stage 2 - offloading with foam and bed mobility  - Penile wounds - keep dry and monitor   7. Fluids/Electrolytes/Nutrition: Routine Is and Os and follow-up chemistries             -carb modified diet   - 6/14: Eating 25-100% meals. SLP dysphagia screen d/t mild dysarthria today.  -6/15  30- 100% intake, continue to monitor   -6/16 Ensure max daily ordered   - 6/24: Na 130; fluid restriction -> 132 w/ IVF -> 133-> 135 6/27   - 6/27: Discontinue IV fluid, transition to salt tabs 1 g twice daily.  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 fib: continue Eliquis for PE  -HR controlled   15: DM-2; A1c = 6.6% on  04/10/2022; (no home meds listed)             -serum glucose range: 89-119             -continue carb modified diet  - No CBGs, monitor BID AC for now  - 6/16-17 very well-controlled; can consider DC if remain low with adequate PO   - 6/18: well controlled, POs 25-75%, DC CBG 6/19   - 6/26: Ketones in UA yesterday; BMP glucose remain <200; POs poor - working on POs today  -6/27: Feels much improved, BMP glucose normal, no further monitoring   16: Acute PE and right lower extremity DVT             -continue Eliquis -> 10 to 5 mg BID dosing 6/17   17: Obesity: BMI 32    18: Chest pain 6/11>>negative trop x 2; EKG non-acute             -attributed to PE versus musculoskeletal   - 6/14: Improved with lidoderm patch. Encourage incentive spirometry.  -6/16 reports this is improved today.   19. Constipation. Schedule Sennakot-S 1 tab QHS and add PRN Miralax daily 17 g.   -6/15 reports improved with BM yesterday   -6/16 denies feeling constipated - add Miralax 17g BID    - LBM 6/23, large after at bedtime suppository - patient reports LBM 6/26; monitor  6/30- per nursing, did chart review, LBM was actually 6/23- will give Suppository today to get him to have BM 20. Spasticity. Extensor tone in [name] and LLE per therapies. - 6/18:  added Baclofen 5 mg TID. Consider PRAFO.  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  Completed treatment with valacyclovir 1000 mg twice daily for 14 days. Discussed with pharmacy that treatmenrt has been completed  22. Emesis: scopolamine patch ordered, improved, will d/c -> resume 6/24; she endorses improvement but continues to require as needed  Zofran, worsening emesis. -KUB without obstruction, severe constipation - scopalamine patch, Zofran PRN - No emesis recorded since 6/25 AM; did get IV yesterday afternoon - IVF as above; minimal POs last 2 days - 6/26: No PRNS today, continue Scopalamine patch and DC in AM if tolerating POs -6/27: No further as needed Zofran, DC scopolamine patch.  Tolerating p.o.'s better  6/29-6/30 pt denied nausea-eating breakfast heartily this AM 23. Right ear decreased hearing: debrox ear drops ordered, improved, will d/c  6/27; resume Debrox stops for 3 days  24.  Encephalopathy/altered mental status  -Neurology consulted, MRI with and without yesterday without any worsening of PML, low suspicion for IRIS at this time but will monitor closely.  Appreciate their recommendations.  -Has remained afebrile, normotensive, without gross signs of infection; however immunocompromise due to AIDs.  Will get a urine with reflex to culture, chest x-ray, KUB today.  -DC tramadol Lexapro due to possible serotonergic effects + SIADH potential causing altered mental status, worsening tone, and emesis.  -Labs yesterday significant for  hyponatremia 130; placed on fluid restriction, pending labs today, start IV fluids 100 cc/h  -If no apparent identifiable cause, will consult ID for further recommendations   - 6/26: Much improved with sodium increase as above; continue current Tx, BMP in AM, transition to PO fluids and salt tabs in AM  6/29- Na 135 this AM 25.  Urinary incontinence, intermittent. May be worsened due to IV fluids   -  UA negative, PVRs low, monitor  6/29- rechecked due to hematuria- had some large Hb in U/A however is negative otherwise.     LOS: 18 days A FACE TO FACE EVALUATION WAS PERFORMED  Emelie Newsom 08/06/2022, 10:31 AM

## 2022-08-07 DIAGNOSIS — A812 Progressive multifocal leukoencephalopathy: Secondary | ICD-10-CM | POA: Diagnosis not present

## 2022-08-07 LAB — BASIC METABOLIC PANEL
Anion gap: 9 (ref 5–15)
BUN: 11 mg/dL (ref 6–20)
CO2: 25 mmol/L (ref 22–32)
Calcium: 9.3 mg/dL (ref 8.9–10.3)
Chloride: 103 mmol/L (ref 98–111)
Creatinine, Ser: 0.87 mg/dL (ref 0.61–1.24)
GFR, Estimated: >60 mL/min (ref >60)
Glucose, Bld: 114 mg/dL — ABNORMAL HIGH (ref 70–99)
Potassium: 4 mmol/L (ref 3.5–5.1)
Sodium: 137 mmol/L (ref 135–145)

## 2022-08-07 LAB — CBC
HCT: 40.1 % (ref 39.0–52.0)
Hemoglobin: 13.3 g/dL (ref 13.0–17.0)
MCH: 29.7 pg (ref 26.0–34.0)
MCHC: 33.2 g/dL (ref 30.0–36.0)
MCV: 89.5 fL (ref 80.0–100.0)
Platelets: 133 10*3/uL — ABNORMAL LOW (ref 150–400)
RBC: 4.48 MIL/uL (ref 4.22–5.81)
RDW: 14.6 % (ref 11.5–15.5)
WBC: 4.1 10*3/uL (ref 4.0–10.5)
nRBC: 0 % (ref 0.0–0.2)

## 2022-08-07 MED ORDER — ESCITALOPRAM OXALATE 10 MG PO TABS
10.0000 mg | ORAL_TABLET | Freq: Every day | ORAL | Status: DC
  Administered 2022-08-07 – 2022-08-13 (×7): 10 mg via ORAL
  Filled 2022-08-07 (×7): qty 1

## 2022-08-07 MED ORDER — SODIUM CHLORIDE 1 G PO TABS
1.0000 g | ORAL_TABLET | Freq: Every day | ORAL | Status: DC
  Administered 2022-08-08 – 2022-08-11 (×4): 1 g via ORAL
  Filled 2022-08-07 (×4): qty 1

## 2022-08-07 MED ORDER — BACLOFEN 5 MG HALF TABLET
5.0000 mg | ORAL_TABLET | Freq: Three times a day (TID) | ORAL | Status: DC
  Administered 2022-08-07 – 2022-08-10 (×9): 5 mg via ORAL
  Filled 2022-08-07 (×9): qty 1

## 2022-08-07 MED ORDER — SENNOSIDES-DOCUSATE SODIUM 8.6-50 MG PO TABS
2.0000 | ORAL_TABLET | Freq: Two times a day (BID) | ORAL | Status: DC
  Administered 2022-08-07 – 2022-08-13 (×11): 2 via ORAL
  Filled 2022-08-07 (×14): qty 2

## 2022-08-07 NOTE — Progress Notes (Signed)
Physical Therapy Session Note  Patient Details  Name: Jose Lamb MRN: [id number] Date of Birth: 10-08-1960  Today's Date: 08/07/2022 PT Individual Time: 1002-1058 PT Individual Time Calculation (min): 56 min   Short Term Goals: Week 3:  PT Short Term Goal 1 (Week 3): STGs = LTGs  Skilled Therapeutic Interventions/Progress Updates:     Pt received seated in [facility] and agrees to therapy, but reports feeling concerned because "wife can't care" for him. PT provides active listening and encouragement and pt eager to participate. WC transport to gym. Pt performs sit to stand with maxA facilitation of anterior weight shift and powering up. MaxA stand pivot to mat with cues for sequencing and positioning. Sit to supine with totalA and cues for positioning and body mechanics. Pt performs supine activities for NMR of Lt hemibody, initially completing x5 bridges in hooklying with PT providing manual stabilization of LLE. PT provides pt with 2lb bar to hold onto with BUEs. Pt able to grap with LLE but cannot maintain consistent grasp, despite max cues. PT places ace wrap on Lt hand to facilitate grip. Pt performs slow bilateral shoulder flexion and extension, with cue to maintain gaze on Lt hand to promote increased visual feedback and motor control. Pt completes 3x10 with PT providing AAROM for optimal performance and due to pt's decreased motor control of [name]. Pt completes 3x10 bridges with PT providing manual overpressure at distal thighs for NM feedback and stability. Supine to sit with totalA. Stand pivot transfer from mat>WC>bed with heavy maxA. Sit to supine with totalA and management of trunk and BLEs. Left semi reclined in bed with all needs within reach.   Therapy Documentation Precautions:  Precautions Precautions: Fall Precaution Comments: Lt hemi, poor sitting/standing balance Restrictions Weight Bearing Restrictions: No   Therapy/Group: Individual Therapy  Beau Fanny, PT,  DPT 08/07/2022, 4:46 PM

## 2022-08-07 NOTE — Progress Notes (Signed)
PROGRESS NOTE   Subjective/Complaints:  No events overnight. Vitals stable Last BM 6/30, with suppository.  Patient feels it was adequate. Na 135->137 this AM; plt mildly low; otherwise normal He endorses no further difficulty hearing out of his right ear after Debrox drops. Patient does have ongoing pain in his left hip, had forgotten that he had as needed oxycodone.  Reminded him to ask for this before therapies to assist in pain.  Overall, he states that he is doing well, no acute complaints. Regarding hematuria, he says that it did go away for a while, then restarted this morning.   ROS:  +L hip pain-ongoing + R hearing deficit-resolved + vision deficit - improved + Hematuria-intermittent Denies fevers, chills, N/V, abdominal pain, constipation, diarrhea, SOB, cough, chest pain, new weakness or paraesthesias.    Except for HPI Objective:   No results found. Recent Labs    08/07/22 0611  WBC 4.1  HGB 13.3  HCT 40.1  PLT 133*     Recent Labs    08/05/22 0530 08/07/22 0611  NA 135 137  K 3.6 4.0  CL 103 103  CO2 24 25  GLUCOSE 99 114*  BUN 9 11  CREATININE 0.87 0.87  CALCIUM 9.3 9.3      Intake/Output Summary (Last 24 hours) at 08/07/2022 0806 Last data filed at 08/07/2022 0500 Gross per 24 hour  Intake 358 ml  Output 625 ml  Net -267 ml          Physical Exam: Vital Signs Blood pressure 131/88, pulse 65, temperature 98.3 F (36.8 C), resp. rate 18, height 5\' 11"  (1.803 m), weight 100 kg, SpO2 97 %.     General: awake, alert, appropriate.  Laying in bed, somewhat lethargic, however awake, alert, and responsive to all questions. HENT: conjugate gaze; oropharynx moist CV: regular rate and rhythm; no JVD Pulmonary: CTA B/L; no W/R/R- good air movement GI: soft, NT, ND, hypoactive Psychiatric: appropriate Neurological: alert- stable per nursing Skin: C/D/I. No apparent lesions.    + IV,  c/d/i + Stage 2 to sacrum   MSK:      No apparent deformity.      Strength: 5/5 BL UE. + MILD left hemineglect  - MUCH improved, can attend with minimal effort MAS 3 hip flexors, MAS 1 knee extensors; MAS 1 PF, Mas 1 finger flexors, MAS 1 wrist flexors, MAS 1 elbow flexor, MAS 1 shoulder adductor -all except hip flexors appear improved from last week  Neurologic exam:  Cognition: AAO x 3,   + Left hemineglect - intermittently worse/better, today can attend with less stimulation than prior + Delayed recall - ongoing + Delayed motor initiation - ongoing + Moderate cognitive deficit - ongoing -appears slightly worse than when he initially came to inpatient rehab  Language: Fluent, No substitutions or neoglisms. Mild dysarthria.  Insight: Good insight into current condition; does wax and wane somewhat Mood: Flat affect.  Sensation:  decreased in left upper and lower extremity CN:+ L sensory loss L V1-3,  Mild facial droop apparent when smiling.       Assessment/Plan: 1. Functional deficits which require 3+ hours per day of interdisciplinary therapy in a  comprehensive inpatient rehab setting. Physiatrist is providing close team supervision and 24 hour management of active medical problems listed below. Physiatrist and rehab team continue to assess barriers to discharge/monitor patient progress toward functional and medical goals  Care Tool:  Bathing    Body parts bathed by patient: Left arm, Chest, Abdomen, Front perineal area, Face, Right upper leg, Left upper leg, Right lower leg, Left lower leg   Body parts bathed by helper: Buttocks, Right arm     Bathing assist Assist Level: Maximal Assistance - Patient 24 - 49%     Upper Body Dressing/Undressing Upper body dressing   What is the patient wearing?: Pull over shirt    Upper body assist Assist Level: Maximal Assistance - Patient 25 - 49%    Lower Body Dressing/Undressing Lower body dressing      What is the patient  wearing?: Pants     Lower body assist Assist for lower body dressing: 2 Helpers     Toileting Toileting    Toileting assist Assist for toileting: Total Assistance - Patient < 25%     Transfers Chair/bed transfer  Transfers assist  Chair/bed transfer activity did not occur: Safety/medical concerns  Chair/bed transfer assist level: 2 Helpers     Locomotion Ambulation   Ambulation assist      Assist level: 2 helpers Assistive device: Walker-rolling Max distance: 12'   Walk 10 feet activity   Assist     Assist level: 2 helpers Assistive device: Walker-rolling   Walk 50 feet activity   Assist Walk 50 feet with 2 turns activity did not occur: Safety/medical concerns         Walk 150 feet activity   Assist Walk 150 feet activity did not occur: Safety/medical concerns         Walk 10 feet on uneven surface  activity   Assist Walk 10 feet on uneven surfaces activity did not occur: Safety/medical concerns         Wheelchair     Assist Is the patient using a wheelchair?: Yes Type of Wheelchair: Manual    Wheelchair assist level: Minimal Assistance - Patient > 75% Max wheelchair distance: 59'    Wheelchair 50 feet with 2 turns activity    Assist        Assist Level: Minimal Assistance - Patient > 75%   Wheelchair 150 feet activity     Assist      Assist Level: Maximal Assistance - Patient 25 - 49%   Blood pressure 131/88, pulse 65, temperature 98.3 F (36.8 C), resp. rate 18, height 5\' 11"  (1.803 m), weight 100 kg, SpO2 97 %.  Medical Problem List and Plan: 1. 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 Pt with subsequent left greater than right weakness and sensory loss particularly involving the lower extremities.              -patient may shower             -ELOS/Goals: 12-16 days  - 08/09/22  DC date             - Continue CIR  PT/OT/SLP   - Per family request, HIV/AIDs diagnosis to remain confidential between wife and patient; do not inform other family  - 6/18: Per wife needs intermittent SPV goals, not realistic given therapies. He does well with cueing. Max A with sitting balance, transfers, and standing. Requires a lot skilled, verbal, and tactile  cues for transferring due to no carryover during and between sessions. LLE shooting out. Burden of care remains high; will work more on STS and transfers than higher-level dynamics. Per SLP on full diet working on speech intelligibility, will do memory eval.  - 6/21: Updated wife on functional prognosis with PML, enforced need for ongoing support at home and anticipate some ongoing functional decline.  Must stay compliant with antiretroviral therapy.  Will discuss with team possibility of powered mobility, given her own functional deficits.   - 6/24: Worsening overall deficits in spasticity, vision, and nausea/vomitting over the weekend; expect progression of PML. MRI w/ and w/o brain pending; Neurology consulted for recommentations  - 6/25: Further cognitive and physical decline, poor safety awareness, D3 diet, spasticity seems somewhat better today.  Awaiting a.m. labs, suspect worsening hyponatremia, start IV fluids 100 cc/h.  - 6/26: MUCH improved cognition/hemineglect with medication adjustments and sodium correction   - 6/27: Per teams, will be heavy assist at home, unsure if family will be able to accommodate safely.  Patient and family extremely motivated for discharge home.  Had discussion with wife, patient; both agreeable to palliative care consult for goals of care and dispo planning discussion.   - 6/28: Palliative consult  - pending note, recs-will inquire with him today.  Per social work, likely outpatient palliative, plan still dispo to home.  Therapies and medical team with substantial concerns regarding wife's ability to provide care in the home setting, will extend  length of stay through Monday next weeks to provide as many opportunities for hand on caregiver training as possible.  Team overall recommending SNF given substantial burden of care, however family extremely motivated for 1 to 2-week trial of discharge home.   2.  Antithrombotics: -DVT/anticoagulation:  Pharmaceutical: Eliquis             -antiplatelet therapy: none   3. 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  Scheduled Tylenol 1000 mg 3 times daily, add tramadol 50 mg twice daily as needed.   - 6/28: D/t medication issues with tramadol and ongoing pain, added oxycodone 5 mg BID PRn   6/29- pt reports meds helping L hip pain  4. Mood/Behavior/Sleep: LCSW to evaluate and provide emotional support             -antipsychotic agents: n/a   5. Neuropsych/cognition: This patient is capable of making decisions on his own behalf.   - SLP eval   - Neuropsych eval 6/18: While the patient clearly had some cognitive issues most of these were noted having to do with patterns consistent with midbrain and subcortical dysfunction and dysregulation. Patient denied significant depression or anxiety and denied significant coping difficulties with his current hospitalization although he is anxious about how much she will be able to improve even with therapies.    - 6/21: Per wife, increasing lethargy, depression, and mood lability at home prior to admission.  Possibly signs of depression secondary to cognitive injury and functional decline.  Initiate activating SSRI Lexapro 10 mg daily.  - 6/25: Discontinue Lexapro, tramadol due to potential serotonergic drug interaction and SIADH contributing to emesis, confusion.   - 6/26: Per wife, patient's daughter passed suddenly this AM; team aware, will provide support as needed, neuropsych informed - appreciate recommendations  - 6/28:  neuropsych saw patient, appreciate Dr. Marvetta Gibbons assistance in this patient's care  - 7/1:  Resume Lexapro 10 mg, retimed for nightly.  Will monitor sodium.   6. Skin/Wound Care: Routine skin care checks  - Sacral stage 2 - offloading with foam and bed mobility  - Penile wounds - keep dry and monitor   7. Fluids/Electrolytes/Nutrition: Routine Is and Os and follow-up chemistries             -carb modified diet   - 6/14: Eating 25-100% meals. SLP dysphagia screen d/t mild dysarthria today.  -6/15  30- 100% intake, continue to monitor   -6/16 Ensure max daily ordered   - 6/24: Na 130; fluid restriction -> 132 w/ IVF -> 133-> 135 6/27   - 6/27: Discontinue IV fluid, transition to salt tabs 1 g twice daily.  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 fib: continue Eliquis for PE  -HR controlled   15: DM-2; A1c = 6.6% on 04/10/2022; (no home meds listed)             -serum glucose range: 89-119             -continue carb modified diet  - No CBGs, monitor BID AC for now  - 6/16-17 very well-controlled; can consider DC if remain low with adequate PO   - 6/18: well controlled, POs 25-75%, DC CBG 6/19   - 6/26: Ketones in UA yesterday; BMP glucose remain <200; POs poor - working on POs today  -6/27: Feels much improved, BMP glucose normal, no further monitoring   16: Acute PE and right lower extremity DVT             -continue Eliquis -> 10  to 5 mg BID dosing 6/17  7/1: Recurrent/persistent hematuria, repeat urinalysis negative for infection, hemoglobin remains stable; will follow-up with urology outpatient if ongoing given possible traumatic catheterization just prior to this starting.   17: Obesity: BMI 32    18: Chest pain 6/11>>negative trop x 2; EKG non-acute             -attributed to PE versus musculoskeletal   - 6/14: Improved with lidoderm patch. Encourage incentive spirometry.  -6/16 reports this is improved today.   19. Constipation. Schedule Sennakot-S 1 tab QHS and add PRN Miralax daily 17 g.   -6/15 reports improved with BM yesterday   -6/16 denies feeling constipated - add Miralax 17g BID    - LBM 6/23, large after at bedtime suppository - patient reports LBM 6/26; monitor  6/30- per nursing, did chart review, LBM was actually 6/23- will give Suppository today to get him to have BM   - 6/30 large BM with suppository; increase sennakots S to 2 tabs BID  20. Spasticity. Extensor tone in [name] and LLE per therapies. - 6/18:  added Baclofen 5 mg  TID. Consider PRAFO.  - 6/19: Benefit with ongoing mild spasticity, increased to 10 mg twice daily - Continue 10 mg TID - 6/25: Increased baclofen to 15 mg TID  - 6/27: Tone improving, will decrease to 10 mg 3 times daily to reduce confounding confusion - 6/28: Mildly increased tone, however comparable with prior days; monitor - 7/1: Tone seems improved everywhere except for left hip; encouraged use of oxycodone, reduce baclofen to 5 mg 3 times daily due to cognition and constipation  21: Penile wounds, suspected HSV.  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  Completed treatment with valacyclovir 1000 mg twice daily for 14 days. Discussed with pharmacy that treatmenrt has been completed  22. Emesis: scopolamine patch ordered, improved, will d/c -> resume 6/24; she endorses improvement but continues to require as needed Zofran, worsening emesis. -KUB without obstruction, severe constipation -  scopalamine patch, Zofran PRN - No emesis recorded since 6/25 AM; did get IV yesterday afternoon - IVF as above; minimal POs last 2 days - 6/26: No PRNS today, continue Scopalamine patch and DC in AM if tolerating POs -6/27: No further as needed Zofran, DC scopolamine patch.  Tolerating p.o.'s better   6/29-6/30 pt denied nausea-eating breakfast heartily this AM 23. Right ear decreased hearing: debrox ear drops ordered, improved, will d/c  6/27; resume Debrox stops for 3 days -resolved  24.  Encephalopathy/altered mental status  -Neurology consulted, MRI with and without yesterday without any worsening of PML, low suspicion for IRIS at this time but will monitor closely.  Appreciate their recommendations.  -Has remained afebrile, normotensive, without gross signs of infection; however immunocompromise due to AIDs.  Will get a urine with reflex to culture, chest x-ray, KUB today.  -DC tramadol Lexapro due to possible serotonergic effects + SIADH potential causing altered mental status, worsening tone, and emesis.  -Labs yesterday significant for hyponatremia 130; placed on fluid restriction, pending labs today, start IV fluids 100 cc/h  -If no apparent identifiable cause, will consult ID for further recommendations   - 6/26: Much improved with sodium increase as above; continue current Tx, BMP in AM, transition to PO fluids and salt tabs in AM  6/29- Na 135 this AM 25.  Urinary incontinence, intermittent. May be worsened due to IV fluids   -  UA negative, PVRs low, monitor  6/29- rechecked due to hematuria- had some large Hb in U/A however is negative otherwise.   7-1: See above, hematuria started with catheterization for urinalysis, likely traumatic cath and ongoing slow bleed due to Eliquis.  Hemoglobin has remained stable, can follow-up further as outpatient.    LOS: 19 days A FACE TO FACE EVALUATION WAS PERFORMED  Angelina Sheriff 08/07/2022, 8:06 AM

## 2022-08-07 NOTE — Plan of Care (Signed)
Goals downgraded d/t pt not progressing functionally.   Problem: RH Eating Goal: LTG Patient will perform eating w/assist, cues/equip (OT) Description: LTG: Patient will perform eating with assist, with/without cues using equipment (OT) Flowsheets (Taken 08/07/2022 0934) LTG: Pt will perform eating with assistance level of: (downgraded 7/1- SD) Minimal Assistance - Patient > 75%   Problem: RH Grooming Goal: LTG Patient will perform grooming w/assist,cues/equip (OT) Description: LTG: Patient will perform grooming with assist, with/without cues using equipment (OT) Flowsheets (Taken 08/07/2022 0934) LTG: Pt will perform grooming with assistance level of: (downgraded 7/1- SD) Minimal Assistance - Patient > 75%   Problem: RH Bathing Goal: LTG Patient will bathe all body parts with assist levels (OT) Description: LTG: Patient will bathe all body parts with assist levels (OT) Flowsheets (Taken 08/07/2022 0934) LTG: Pt will perform bathing with assistance level/cueing: (downgraded 7/1- SD) Maximal Assistance - Patient 25 - 49%   Problem: RH Dressing Goal: LTG Patient will perform upper body dressing (OT) Description: LTG Patient will perform upper body dressing with assist, with/without cues (OT). Flowsheets (Taken 08/07/2022 0934) LTG: Pt will perform upper body dressing with assistance level of: (downgraded 7/1- SD) Moderate Assistance - Patient 50 - 74% Goal: LTG Patient will perform lower body dressing w/assist (OT) Description: LTG: Patient will perform lower body dressing with assist, with/without cues in positioning using equipment (OT) Outcome: Not Applicable   Problem: RH Toileting Goal: LTG Patient will perform toileting task (3/3 steps) with assistance level (OT) Description: LTG: Patient will perform toileting task (3/3 steps) with assistance level (OT)  Outcome: Not Applicable   Problem: RH Functional Use of Upper Extremity Goal: LTG Patient will use RT/LT upper extremity as a  (OT) Description: LTG: Patient will use right/left upper extremity as a stabilizer/gross assist/diminished/nondominant/dominant level with assist, with/without cues during functional activity (OT) Flowsheets (Taken 08/07/2022 0934) LTG: Pt will use upper extremity in functional activity with assistance level of: (downgraded 7/1- SD) Moderate Assistance - Patient 50 - 74%   Problem: RH Toilet Transfers Goal: LTG Patient will perform toilet transfers w/assist (OT) Description: LTG: Patient will perform toilet transfers with assist, with/without cues using equipment (OT) Outcome: Not Applicable   Problem: RH Tub/Shower Transfers Goal: LTG Patient will perform tub/shower transfers w/assist (OT) Description: LTG: Patient will perform tub/shower transfers with assist, with/without cues using equipment (OT) Outcome: Not Applicable   Problem: RH Awareness Goal: LTG: Patient will demonstrate awareness during functional activites type of (OT) Description: LTG: Patient will demonstrate awareness during functional activites type of (OT) Flowsheets (Taken 08/07/2022 0934) Patient will demonstrate awareness during functional activites type of: Emergent LTG: Patient will demonstrate awareness during functional activites type of (OT): (downgraded 7/1- SD) Minimal Assistance - Patient > 75%

## 2022-08-07 NOTE — Progress Notes (Signed)
Occupational Therapy Session Note  Patient Details  Name: Jose Lamb MRN: [id number] Date of Birth: January 30, 1961  Session 1: Today's Date: 08/07/2022 OT Individual Time: 4098-1191 OT Individual Time Calculation (min): 55 min    Short Term Goals: Week 3:  OT Short Term Goal 1 (Week 3): Pt will complete slide board transfer with Mod A OT Short Term Goal 2 (Week 3): Pt will complete bed mobility rolling L and R with Min A OT Short Term Goal 3 (Week 3): Pt will don pants supine with Mod A   Skilled Therapeutic Interventions/Progress Updates:  Pt received lying in bed supine.  Pt did state he was not feeling the best today but was agreeable to OT session and wanted to shower.  Supine<> EOB completed with Max A. Pt continues to show difficultly maintaining static sitting balance at EOB with L lateral and anterior lean.  Stedy transfer from bed to shower with Max A +2 related to maintain safety and midline orientation with heavy L lean without.Pt was able to power up from edge of bed with Mod A.  Pt completed Upper and Lower body bathing with Mod A for safety and midline orientation as well as bathing of the LE and R side.  Pt did demonstrate increased postural control placed into weight bearing/blocked using grab.  Postural control heavily reliant on the wall. Stedy transfer from shower to wheelchair completed with Max A +2 with Mod verbal cues for maintaining midline. Selfcare completed at the sink while sitting in TIS.  Self care completed with Mod A for applying deodorant. Upper and lower body dressing completed with Max A.  Pt had difficulty sequencing putting on UB clothing and was unable to follow directions. R gaze still very much present.  Pt left sitting in TIS with chair alarm in place, call light within reach and all needs met.         Session 2: Today's Date: 08/07/2022 OT Individual Time: 1420-1500 OT Individual Time Calculation (min): 40 min   Skilled Therapeutic Interventions/Progress  Updates:  Pt received lying supine in bed with HOB raised talking on the phone.  Pt ended call and was receptive to OT session.  Pt had no c/o this pm. Session focus on bed mobility for carryover to reduce caregiver burden. LB dressing completed with Max A with bridging. Blocked practice bed mobility rolling R<> L with Max cuing and Mod A overall.  Poor motor planning and apraxia noted.  Scooting with supported LE to [facility] with Max A.  1 transfer supine<>sit with use of bed features towards the L side with improvement independence compared to R but Max A overall.  During return back to bed pt yelling in pain from L hip. Pain elevated once positioned in bed. Pt left in bed lying supine, with call light in reach, and all needs met.          Therapy Documentation Precautions:  Precautions Precautions: Fall Precaution Comments: Lt hemi, poor sitting/standing balance Restrictions Weight Bearing Restrictions: No      Therapy/Group: Individual Therapy  [name] 08/07/2022, 7:28 AM

## 2022-08-07 NOTE — Progress Notes (Signed)
Speech Language Pathology Daily Session Note  Patient Details  Name: Jose Lamb MRN: [id number] Date of Birth: 17-Feb-1960  Today's Date: 08/07/2022 SLP Individual Time: 1330-1415 SLP Individual Time Calculation (min): 45 min  Short Term Goals: Week 3: SLP Short Term Goal 1 (Week 3): Patient will consume current diet with supervision for use of swallowing compensatory strategies. SLP Short Term Goal 2 (Week 3): Patient will utilize speech intelligibility strategies at the sentence level to achieve ~90% intelligibility with min A verbal cues. SLP Short Term Goal 3 (Week 3): Patient will demonstrate functional problem solving for basic tasks with supervision verbal cues. SLP Short Term Goal 4 (Week 3): Patient will recall two compensatory memory strategies with verbal min cues.  Skilled Therapeutic Interventions: Skilled treatment session focused on dysphagia and communication goals. Upon arrival, patient was awake while upright in bed. Patient with food in his bed and in his beard with patient reporting increased difficulty with self-feeding, suspect poor positioning in bed impacting function. SLP provided education regarding the importance of an upright position as much as possible since he is currently on an air mattress. Patient verbalized understanding but also requested supervision with meals to assist with feeding as needed. SLP repositioned patient in bed with +2 assist with 3 pillows providing additional support in bed. Patient consumed thin liquids via straw without overt s/s of aspiration. Recommend patient continue current diet. Patient participated in an informal conversation that focused on discharge planning, etc with ~70% intelligibility with overall Min-Mod A verbal cues needed for use of speech intelligibility strategies. Patient left upright in bed with alarm on and all needs within reach. Continue with current plan of care.      Pain 9/10 pain in right hip Nursing made aware,  administered medications, and donned a Lidoderm patch   Therapy/Group: Individual Therapy  Jose Lamb 08/07/2022, 3:05 PM

## 2022-08-07 NOTE — Progress Notes (Addendum)
Patient ID: Jose Lamb, male   DOB: 1960/06/23, 62 y.o.   MRN: [id number]  SW spoke with palliative care and was informed that reports indicate d/c to home, completed Most form, and  Palliative referral for [facility]. SW will place referral.   1057-SW spoke with medical team ,and informed that an extension is needed for family education. SW called pt wife to discuss. Fam edu is scheduled for Tues/Wed/Fri 8am-11am, and d/c date now 7/8. Pt wife aware SW will order DME, and hopefully items can be delivered on Saturday.   SW met with pt in room to discuss above. He is amenable to staying until Monday for family edu needed.   SW ordered DME with [employer]: hospital bed, hoyer lift,TTB, and 3in1 BSC.   Ambulance transportation scheduled for Monday, July 8 at 10am with [employer].   Cecile Sheerer, MSW, LCSWA Office: [phone] Cell: [phone] Fax: [phone]

## 2022-08-08 DIAGNOSIS — A812 Progressive multifocal leukoencephalopathy: Secondary | ICD-10-CM | POA: Diagnosis not present

## 2022-08-08 NOTE — Patient Care Conference (Signed)
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 Elijah Birk Social Worker Present: Cecile Sheerer, LCSWA Nurse Present: Vedia Pereyra, RN PT Present: Malachi Pro, PT OT Present: Jake Shark, OT SLP Present: Feliberto Gottron, SLP PPS Coordinator present : Fae Pippin, SLP     Current Status/Progress Goal Weekly Team Focus  Bowel/Bladder   Pt is continent B/B with some incontinence episodes. LBM 08/06/22   Pt will regain full B/B incontinence   Toilet pt q2-3 hrs qshift/prn    Swallow/Nutrition/ Hydration   Dys. 3 textures with thin liquids via straw. Full supervision to maximize safety with PO intake.   Supervision  Family education scheduled. Continued focus on diet tolerance due to fluctuating levels of functioning.    ADL's   Total-max A +2 overall. Poor midline orientation, now pushing as well in sitting/standing. [name] with pec activation, painful extensor tone   Downgraded to mod/max A   ADL retraining to reduce caregiver burden, hoyer training, postural control sitting balance    Mobility   max to totalA all mobility, sitting balance maxA overall   MaxA  Continued family education, WC evaluation, DC prep    Communication    Min-Mod A verbal cues for use of speech intelligibility strategies at the sentence level. Poor coordination of respiration for speech production. Patient is ~70-90% intelligible.   Min A at the sentence level   Family education scheduled. Ongoing focus on use of speech intelligibility strateiges and self-monitoring and correcting communication breakdowns    Safety/Cognition/ Behavioral Observations  Mod A for basic problem solving   Supervision-Goal may be downgraded again as his overall cognitive functioning fluctuates throughout the day   Family education with patient's wife with education on how to maximize overall safety at discharge.    Pain   Pt denies pain at this time   Will be free from pain   Assess pt for pain qshift/prn    Skin   Skin is intact   Will maintain skin intergrity with no breakdown  Assess skin for breakdown qshift/prn      Discharge Planning:  Plan is for pt to discharge to home with his wife who will provide 24/7 care. Plans for her to have support from family that will come in to help assist with his care. FAm edu scheduled for TUes,Wed,Fri this week. [facility] for HHPT./OT/SLP. DME ordered- hoyer lift, hospital bed, TTB, 3in1 BSC with [employer] [facility]. SW will confirm there area no barriers to discharge.   Team Discussion: PML. Will transition to Palliative once home. Family education this morning went well. Wheelchair evaluation tomorrow. Addressing increase pain/tone to left hip. Remains full supervision D3 diet/thin liquids with 1200 fluid restriction. 3 step skin care added due to incontinence.  Patient on target to meet rehab  goals: yes, progressing towards goals for discharge date of 08/14/22  *See Care Plan and progress notes for long and short-term goals.   Revisions to Treatment Plan:  Medication adjustments, monitor labs, VS  Teaching Needs: Medications, safety, self care, transfer training, skin care, diet modifications, etc.   Current  Barriers to Discharge: Decreased caregiver support, Incontinence, and Behavior  Possible Resolutions to Barriers: Family education Maintain skin integrity Maximize safety for home Order recommended DME      Medical Summary Current Status: medically complicated by PML, spasticity, pain in L hip, cognitive deficits, difficulty hearing, hematuria, constipation and wounds  Barriers to Discharge: Behavior/Mood;Infection/IV Antibiotics;Medical stability;Self-care education;Spasticity;Uncontrolled Pain  Barriers to Discharge Comments: progressive neurodegenerative process of PML, spasticity, pain control, weakness, constipation Possible Resolutions to [employer] Focus: palliative assistance in OP transition to home, titrate spasticity and pain medications to limit sedation, medicaitons for constipation   Continued Need for Acute Rehabilitation Level of Care: The patient requires daily medical management by a physician with specialized training in physical medicine and rehabilitation for the following reasons: Direction of a multidisciplinary physical rehabilitation program to maximize functional independence : Yes Medical management of patient stability for increased activity during participation in an intensive rehabilitation regime.: Yes Analysis of laboratory values and/or radiology reports with any subsequent need for medication adjustment and/or medical intervention. : Yes   I attest that I was present, lead the team conference, and concur with the assessment and plan of the team.   Jearld Adjutant 08/08/2022, 1:57 PM

## 2022-08-08 NOTE — Progress Notes (Signed)
Patient ID: Jose Lamb, male   DOB: 1961/01/15, 62 y.o.   MRN: [id number]  SW met with pt in room to provide updates from team conference, and DME ordered. SW will come by and follow-up with family tomorrow during family edu.   Cecile Sheerer, MSW, LCSWA Office: [phone] Cell: [phone] Fax: [phone]

## 2022-08-08 NOTE — Progress Notes (Signed)
Speech Language Pathology Daily Session Note  Patient Details  Name: Jose Lamb MRN: [id number] Date of Birth: 11-17-1960  Today's Date: 08/08/2022 SLP Individual Time: 0830-0900 SLP Individual Time Calculation (min): 30 min  Short Term Goals: Week 3: SLP Short Term Goal 1 (Week 3): Patient will consume current diet with supervision for use of swallowing compensatory strategies. SLP Short Term Goal 2 (Week 3): Patient will utilize speech intelligibility strategies at the sentence level to achieve ~90% intelligibility with min A verbal cues. SLP Short Term Goal 3 (Week 3): Patient will demonstrate functional problem solving for basic tasks with supervision verbal cues. SLP Short Term Goal 4 (Week 3): Patient will recall two compensatory memory strategies with verbal min cues.  Skilled Therapeutic Interventions: Skilled treatment session focused on completion of family education with the patient, his wife, his sister, and a family friend. SLP provided education regarding patient's current swallowing function, diet recommendations, appropriate textures, swallowing compensatory strategies, overt s/s of aspiration, and medication administration. Education was also provided on  how to make diet modifications if needed as patient's overall function can fluctuate. SLP also provided education regarding patient's current speech intelligibility strategies as well as energy conversation to maximize intelligibility. All verbalized understanding and handouts were also given to reinforce information. Patient handed off to PT. Continue with current plan of care.      Pain No/Denies Pain   Therapy/Group: Individual Therapy  Kristyanna Barcelo 08/08/2022, 10:24 AM

## 2022-08-08 NOTE — Progress Notes (Signed)
Physical Therapy Session Note  Patient Details  Name: Jose Lamb MRN: [id number] Date of Birth: Feb 08, 1960  Today's Date: 08/08/2022 PT Individual Time: 0900-1000 and 1348-1445 PT Individual Time Calculation (min): 60 min and 57 min  Short Term Goals: Week 3:  PT Short Term Goal 1 (Week 3): STGs = LTGs  Skilled Therapeutic Interventions/Progress Updates:     1st Session: Pt received semi reclined in bed and agrees to therapy. Pain reported in Lt hip during session. PT provides rest breaks as needed to manage pain. Pt's wife, sister-in-law, and family friend present for family education. PT provides update on pt's functional status as well as recommendations for mobility following  discharge. PT then demonstrates use of manual hoyer lift. Pt transported to gym via [facility] and family joins. PT then has pt's wife perform primary responsibilities for using hoyer lift to transfer pt from [facility] to mat table, with PT providing min/mod cues for sequencing. Pt sits at edge of mat with PT providing mod to maxA for static sitting balance, with pt demonstrating tendency to lean posteriorly and push with RUE toward the Lt. Pt's family then provides assistance to transfer pt via hoyer from mat>WC>bed, with PT providing cues for safety, sequencing, and positioning. Pt left supine with all needs within reach,   2nd Session: Pt received supine in bed asleep. Easily awakens to visual stimuli and agrees to therapy. Pt not reporting any pain at this time, but PT provides education on need to ask for pain meds when desired, as pt frequently has Lt hip pain. Pt performs supine to sit with maxA and cues for body mechanics, sequencing, and positioning. Pt demonstrates improved sitting balance, requiring modA for static sitting prior to transfer. Pt then completes stand pivot transfer to the Rt with maxA and facilitation of anterior weight shift and sequencing. WC transport to gym. Pt participates in NMR for Lt hemibody, as well  as transfer training, facing mirror and cued to utilize parallel bar with both hands to stand. Pt stands with maxA and PT blocking Lt knee, and able to remain standing for 2-3 minutes with modA/maxA to promote trunk extension and maintain close-to-neutral posture. Following extended seated rest break, pt performs same task 2 additional bouts with similar assistance and cues. RN present and provides pt with pain medicine. Pt then returns to room via WC. Stand pivot to bed with maxA/totalA and same for sit to supine. Left with call bell in reach.   Therapy Documentation Precautions:  Precautions Precautions: Fall Precaution Comments: Lt hemi, poor sitting/standing balance Restrictions Weight Bearing Restrictions: No  Therapy/Group: Individual Therapy  Beau Fanny, PT, DPT 08/08/2022, 4:55 PM

## 2022-08-08 NOTE — Progress Notes (Signed)
Occupational Therapy Session Note  Patient Details  Name: Jose Lamb MRN: [id number] Date of Birth: April 24, 1960  Today's Date: 08/08/2022 OT Individual Time: 8295-6213 OT Individual Time Calculation (min): 60 min    Short Term Goals: Week 3:  OT Short Term Goal 1 (Week 3): Pt will complete slide board transfer with Mod A OT Short Term Goal 2 (Week 3): Pt will complete bed mobility rolling L and R with Min A OT Short Term Goal 3 (Week 3): Pt will don pants supine with Mod A  Skilled Therapeutic Interventions/Progress Updates:    Pt received lying supine in bed.  No c/o of pain this am.  Pt breakfast delivered by OTS and pt supine with HOB elevated eating breakfast.  Family to arrive at 8:00am for family ed. Session focused on family education for bed mobility and ADL care at bed level.  Wife, Sister and friend present for family ed.  Education provided on use of hospital bed functions for support during ADL care at bed level and reduce caregiver burden.  Hoyer lift was brought in room and discussion about possible DME needed for home discussed and reassured family the PT would be providing education on proper use of the hoyer lift due to time constraints today. Rolling from L<>R and proper technique for wife's body mechanics reviewed.  Pt able to roll L<>R with Mod A and Mod verbal cuing.   Pt did need to void during session ~ 40 ml with use of the urinal.  Documented within flow sheet.  Education on LB bathing and dressing reviewed with wife and Pt was able to complete peri care with Mod A and LB dressing with Max A and Mod verbal cuing.  UB bathing/dressing completed with Max A and Mod verbal cuing. Proper positioning for bed when pt is lying supine reviewed with wife including HOB and feet elevated.  Wife expressed understanding of all demonstrations and was engaged in family education.  Pt left lying supine in bed prepared for SLP session immediately after.    Therapy  Documentation Precautions:  Precautions Precautions: Fall Precaution Comments: Lt hemi, poor sitting/standing balance Restrictions Weight Bearing Restrictions: No      Therapy/Group: Individual Therapy  [name] 08/08/2022, 7:46 AM

## 2022-08-08 NOTE — Progress Notes (Signed)
PROGRESS NOTE   Subjective/Complaints:  No events overnight. Vitals stable Seen in the inpatient gym, participating in family training with his wife, along with some family friends.  Patient initially stating he feels well today, no complaints, does endorse ongoing pain and spasticity in the left hip, has not used as needed pain medications to help.  For therapies, seems to happen in short spurts, mostly spasms, that resolved quickly.  Wife also notes intermittent difficulty with vision and tracking.    ROS:  + L hip pain-ongoing, intermittent + R hearing deficit-resolved + vision deficit - intermittent + Hematuria- improving Denies fevers, chills, N/V, abdominal pain, constipation, diarrhea, SOB, cough, chest pain, new weakness or paraesthesias.    Except for HPI Objective:   No results found. Recent Labs    08/07/22 0611  WBC 4.1  HGB 13.3  HCT 40.1  PLT 133*      Recent Labs    08/07/22 0611  NA 137  K 4.0  CL 103  CO2 25  GLUCOSE 114*  BUN 11  CREATININE 0.87  CALCIUM 9.3      Intake/Output Summary (Last 24 hours) at 08/08/2022 0848 Last data filed at 08/08/2022 0700 Gross per 24 hour  Intake 357 ml  Output 800 ml  Net -443 ml          Physical Exam: Vital Signs Blood pressure 124/78, pulse 77, temperature 97.9 F (36.6 C), resp. rate 18, height 5\' 11"  (1.803 m), weight 100 kg, SpO2 100 %.     General: awake, alert, appropriate.  Sitting up in gym with min assist for truncal stability. HENT: conjugate gaze; oropharynx moist CV: regular rate and rhythm; no JVD Pulmonary: CTA B/L; no W/R/R- good air movement GI: soft, NT, ND, hypoactive Psychiatric: appropriate Neurological: alert- stable per nursing Skin: C/D/I. No apparent lesions.    + Stage 2 to sacrum   MSK:      No apparent deformity.      Strength: Moving all 4 limbs antigravity and against resistance.  Complicated by left  hemineglect.  Spasticity not evaluated today is participating with OT MAS 3 hip flexors, MAS 1 knee extensors; MAS 1 PF, Mas 1 finger flexors, MAS 1 wrist flexors, MAS 1 elbow flexor, MAS 1 shoulder adductor -all except hip flexors appear improved from last week  Neurologic exam:  Cognition: AAO x 3,   + left hemineglect  - , can attend with minimal effort; still has some truncal lean + Delayed recall - ongoing + Delayed motor initiation - ongoing + Moderate cognitive deficit - ongoing -appears slightly worse than when he initially came to inpatient rehab  Language: Fluent, No substitutions or neoglisms. Mild dysarthria.  Insight: Good insight into current condition; does wax and wane somewhat Mood: Flat affect.  Sensation:  decreased in left upper and lower extremity CN:+ L sensory loss L V1-3,  Mild facial droop apparent when smiling.       Assessment/Plan: 1. Functional deficits which require 3+ hours per day of interdisciplinary therapy in a comprehensive inpatient rehab setting. Physiatrist is providing close team supervision and 24 hour management of active medical problems listed below. Physiatrist and rehab team continue  to assess barriers to discharge/monitor patient progress toward functional and medical goals  Care Tool:  Bathing    Body parts bathed by patient: Left arm, Chest, Abdomen, Front perineal area, Face, Right upper leg, Left upper leg, Right lower leg, Left lower leg   Body parts bathed by helper: Buttocks, Right arm     Bathing assist Assist Level: Maximal Assistance - Patient 24 - 49%     Upper Body Dressing/Undressing Upper body dressing   What is the patient wearing?: Pull over shirt    Upper body assist Assist Level: Maximal Assistance - Patient 25 - 49%    Lower Body Dressing/Undressing Lower body dressing      What is the patient wearing?: Pants     Lower body assist Assist for lower body dressing: 2 Helpers      Toileting Toileting    Toileting assist Assist for toileting: Total Assistance - Patient < 25%     Transfers Chair/bed transfer  Transfers assist  Chair/bed transfer activity did not occur: Safety/medical concerns  Chair/bed transfer assist level: 2 Helpers     Locomotion Ambulation   Ambulation assist      Assist level: 2 helpers Assistive device: Walker-rolling Max distance: 12'   Walk 10 feet activity   Assist     Assist level: 2 helpers Assistive device: Walker-rolling   Walk 50 feet activity   Assist Walk 50 feet with 2 turns activity did not occur: Safety/medical concerns         Walk 150 feet activity   Assist Walk 150 feet activity did not occur: Safety/medical concerns         Walk 10 feet on uneven surface  activity   Assist Walk 10 feet on uneven surfaces activity did not occur: Safety/medical concerns         Wheelchair     Assist Is the patient using a wheelchair?: Yes Type of Wheelchair: Manual    Wheelchair assist level: Minimal Assistance - Patient > 75% Max wheelchair distance: 29'    Wheelchair 50 feet with 2 turns activity    Assist        Assist Level: Minimal Assistance - Patient > 75%   Wheelchair 150 feet activity     Assist      Assist Level: Maximal Assistance - Patient 25 - 49%   Blood pressure 124/78, pulse 77, temperature 97.9 F (36.6 C), resp. rate 18, height 5\' 11"  (1.803 m), weight 100 kg, SpO2 100 %.  Medical Problem List and Plan: 1. 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 Pt with subsequent left greater than right weakness and sensory loss particularly involving the lower extremities.              -patient may shower             -ELOS/Goals: 12-16 days  - Goals Max A/Total A, caregiver goals now - 08/14/22  DC date             - Continue CIR PT/OT/SLP   - Per family request, HIV/AIDs  diagnosis to remain confidential between wife and patient; do not inform other family  - 6/18: Per wife needs intermittent SPV goals, not realistic given therapies. He does well with cueing. Max A with sitting balance, transfers, and standing. Requires a lot skilled, verbal, and tactile cues for transferring due to no carryover during and between sessions. LLE shooting out. Burden of care  remains high; will work more on STS and transfers than higher-level dynamics. Per SLP on full diet working on speech intelligibility, will do memory eval.  - 6/21: Updated wife on functional prognosis with PML, enforced need for ongoing support at home and anticipate some ongoing functional decline.  Must stay compliant with antiretroviral therapy.  Will discuss with team possibility of powered mobility, given her own functional deficits.   - 6/24: Worsening overall deficits in spasticity, vision, and nausea/vomitting over the weekend; expect progression of PML. MRI w/ and w/o brain pending; Neurology consulted for recommentations  - 6/25: Further cognitive and physical decline, poor safety awareness, D3 diet, spasticity seems somewhat better today.  Awaiting a.m. labs, suspect worsening hyponatremia, start IV fluids 100 cc/h.  - 6/26: MUCH improved cognition/hemineglect with medication adjustments and sodium correction   - 6/27: Per teams, will be heavy assist at home, unsure if family will be able to accommodate safely.  Patient and family extremely motivated for discharge home.  Had discussion with wife, patient; both agreeable to palliative care consult for goals of care and dispo planning discussion.   - 6/28: Palliative consult  - prognosis 6-12 months; rec'd home with palliative nursing and option to transition to hospice - Therapies and medical team with substantial concerns regarding wife's ability to provide care in the home setting, will extend length of stay through Monday next week to provide as many  opportunities for hand on caregiver training as possible.  Team overall recommending SNF given substantial burden of care, however family extremely motivated for 1 to 2-week trial of discharge home.   - [employer] WC eval 7/3  2.  Antithrombotics: -DVT/anticoagulation:  Pharmaceutical: Eliquis             -antiplatelet therapy: none   3. 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  Scheduled Tylenol 1000 mg 3 times daily, add tramadol 50 mg twice daily as needed.   - 6/28: D/t medication issues with tramadol and ongoing pain, added oxycodone 5 mg BID PRn   6/29- pt reports meds helping L hip pain  4. Mood/Behavior/Sleep: LCSW to evaluate and provide emotional support             -antipsychotic agents: n/a   5. Neuropsych/cognition: This patient is capable of making decisions on his own behalf.   - SLP eval   - Neuropsych eval 6/18: While the patient clearly had some cognitive issues most of these were noted having to do with patterns consistent with midbrain and subcortical dysfunction and dysregulation. Patient denied significant depression or anxiety and denied significant coping difficulties with his current hospitalization although he is anxious about how much she will be able to improve even with therapies.    - 6/21: Per wife, increasing lethargy, depression, and mood lability at home prior to admission.  Possibly signs of depression secondary to cognitive injury and functional decline.  Initiate activating SSRI Lexapro 10 mg daily.  - 6/25: Discontinue Lexapro, tramadol due to potential serotonergic drug interaction and SIADH contributing to emesis, confusion.   - 6/26: Per wife, patient's daughter passed suddenly this AM; team aware, will provide support as needed, neuropsych informed - appreciate recommendations  - 6/28: neuropsych saw patient, appreciate Dr.  Marvetta Gibbons assistance in this patient's care  - 7/1: Resume Lexapro 10 mg, retimed for nightly.  Will monitor sodium.   6. Skin/Wound Care:  Routine skin care checks  - Sacral stage 2 - offloading with foam and bed mobility  - Penile wounds - keep dry and monitor   7. Fluids/Electrolytes/Nutrition: Routine Is and Os and follow-up chemistries             -carb modified diet   - 6/14: Eating 25-100% meals. SLP dysphagia screen d/t mild dysarthria today.  -6/15  30- 100% intake, continue to monitor   -6/16 Ensure max daily ordered   - 6/24: Na 130; fluid restriction -> 132 w/ IVF -> 133-> 135 6/27   - 6/27: Discontinue IV fluid, transition to salt tabs 1 g twice daily.  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 fib: continue Eliquis for PE  -HR controlled   15: DM-2; A1c = 6.6% on 04/10/2022; (no home meds listed)             -serum glucose range: 89-119             -continue carb modified diet  - No CBGs, monitor BID AC for now  - 6/16-17 very well-controlled; can consider DC if remain low with adequate PO   - 6/18: well controlled, POs 25-75%, DC CBG 6/19   - 6/26: Ketones in UA yesterday; BMP glucose remain <200; POs poor - working on POs today  -6/27: Feels much improved, BMP glucose normal, no further monitoring   16: Acute PE and right lower extremity DVT             -continue Eliquis -> 10 to 5 mg BID dosing 6/17    17:  Obesity: BMI 32    18: Chest pain 6/11>>negative trop x 2; EKG non-acute             -attributed to PE versus musculoskeletal   - 6/14: Improved with lidoderm patch. Encourage incentive spirometry.  -6/16 reports this is improved today.   19. Constipation. Schedule Sennakot-S 1 tab QHS and add PRN Miralax daily 17 g.   -6/15 reports improved with BM yesterday   -6/16 denies feeling constipated - add Miralax 17g BID    - LBM 6/23, large after at bedtime suppository - patient reports LBM 6/26; monitor  6/30- per nursing, did chart review, LBM was actually 6/23- will give Suppository today to get him to have BM   - 6/30 large BM with suppository; increase sennakots S to 2 tabs BID  20. Spasticity. Extensor tone in [name] and LLE per therapies. - 6/18:  added Baclofen 5 mg TID. Consider PRAFO.  - 6/19: Benefit with ongoing mild spasticity, increased to 10 mg twice daily - Continue 10 mg TID - 6/25: Increased baclofen to 15 mg TID  - 6/27: Tone improving, will decrease to 10 mg 3 times daily  to reduce confounding confusion - 6/28: Mildly increased tone, however comparable with prior days; monitor - 7/1: Tone seems improved everywhere except for left hip; encouraged use of oxycodone, reduce baclofen to 5 mg 3 times daily due to cognition and constipation -encouraged use of oxycodone as needed 7-2  21: Penile wounds, suspected HSV.  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  Completed treatment with valacyclovir 1000 mg twice daily for 14 days. Discussed with pharmacy that treatmenrt has been completed  22. Emesis: scopolamine patch ordered, improved, will d/c -> resume 6/24; she endorses improvement but continues to require as needed Zofran, worsening emesis.  Resolved -KUB without obstruction, severe constipation - scopalamine patch, Zofran PRN - No emesis recorded since 6/25 AM; did get IV yesterday afternoon - IVF as above; minimal POs last 2 days - 6/26: No PRNS today, continue Scopalamine patch and DC in AM if  tolerating POs -6/27: No further as needed Zofran, DC scopolamine patch.  Tolerating p.o.'s better  6/29-6/30 pt denied nausea-eating breakfast heartily this AM  23. Right ear decreased hearing: debrox ear drops ordered, improved, will d/c  6/27; resume Debrox stops for 3 days -resolved  24.  Encephalopathy/altered mental status -overall improved, continues some waxing and waning   -Neurology consulted, MRI with and without yesterday without any worsening of PML, low suspicion for IRIS at this time but will monitor closely.  Appreciate their recommendations.  -Has remained afebrile, normotensive, without gross signs of infection; however immunocompromise due to AIDs.  Will get a urine with reflex to culture, chest x-ray, KUB today.  -DC tramadol Lexapro due to possible serotonergic effects + SIADH potential causing altered mental status, worsening tone, and emesis.  -Labs yesterday significant for hyponatremia 130; placed on fluid restriction, pending labs today, start IV fluids 100 cc/h  -If no apparent identifiable cause, will consult ID for further recommendations   - 6/26: Much improved with sodium increase as above; continue current Tx, BMP in AM, transition to PO fluids and salt tabs in AM  6/29- Na 135 this AM  25.  Urinary incontinence, intermittent. May be worsened due to IV fluids   -  UA negative, PVRs low, monitor  6/29- rechecked due to hematuria- had some large Hb in U/A however is negative otherwise.   7-1: See above, hematuria started with catheterization for urinalysis, likely traumatic cath and ongoing slow bleed due to Eliquis.  Hemoglobin has remained stable, can follow-up further as outpatient.  7-2: Per review, no gross hematuria, remains somewhat dark-colored but improving.    LOS: 20 days A FACE TO FACE EVALUATION WAS PERFORMED  Angelina Sheriff 08/08/2022, 8:48 AM

## 2022-08-09 DIAGNOSIS — A812 Progressive multifocal leukoencephalopathy: Secondary | ICD-10-CM | POA: Diagnosis not present

## 2022-08-09 NOTE — Progress Notes (Signed)
PROGRESS NOTE   Subjective/Complaints:  No events overnight. Vitals stable Some ongoing pain in the left hip, did improve with use of as needed oxycodone yesterday, will try again today. Had a large, liquid bowel movement overnight.  Feels was adequate, no ongoing nausea, vomiting, or abdominal pain.  ROS:  + L hip pain-ongoing, intermittent + R hearing deficit-resolved + vision deficit - intermittent + Hematuria- improving Denies fevers, chills, N/V, abdominal pain, constipation, diarrhea, SOB, cough, chest pain, new weakness or paraesthesias.    Except for HPI Objective:   No results found. Recent Labs    08/07/22 0611  WBC 4.1  HGB 13.3  HCT 40.1  PLT 133*      Recent Labs    08/07/22 0611  NA 137  K 4.0  CL 103  CO2 25  GLUCOSE 114*  BUN 11  CREATININE 0.87  CALCIUM 9.3      Intake/Output Summary (Last 24 hours) at 08/09/2022 1301 Last data filed at 08/09/2022 0806 Gross per 24 hour  Intake 360 ml  Output 550 ml  Net -190 ml          Physical Exam: Vital Signs Blood pressure 136/83, pulse 65, temperature 97.8 F (36.6 C), temperature source Oral, resp. rate 18, height 5\' 11"  (1.803 m), weight 89 kg, SpO2 95 %.     General: awake, alert, appropriate.  Laying in bed HENT: conjugate gaze; oropharynx moist CV: regular rate and rhythm; no JVD Pulmonary: CTA B/L; no W/R/R- good air movement GI: soft, NT, ND, hypoactive Psychiatric: appropriate Neurological: alert-mildly lethargic but stable Skin: C/D/I. No apparent lesions.    + Stage 2 to sacrum   MSK:      No apparent deformity.      Strength: Moving all 4 limbs antigravity and against resistance.  Complicated by left hemineglect.-Needs significant encouragement for left upper extremity mobilization  MAS 2 hip flexors, MAS 1 knee extensors; MAS 1 PF, Mas 1 finger flexors, MAS 1 wrist flexors, MAS 1 elbow flexor, MAS 1 shoulder adductor  -all except hip flexors appear improving; somewhat better today but had therapies this a.m. so likely had just stretched  Neurologic exam:  Cognition: AAO x 3,   + left hemineglect  - , can attend with minimal effort; still has some truncal lean + Delayed recall - ongoing + Delayed motor initiation - ongoing + Moderate cognitive deficit - ongoing -appears slightly worse than when he initially came to inpatient rehab  Language: Fluent, No substitutions or neoglisms. Mild dysarthria.  Insight: Good insight into current condition; does wax and wane somewhat Mood: Flat affect.  Sensation:  decreased in left upper and lower extremity CN:+ L sensory loss L V1-3,  Mild facial droop apparent when smiling.       Assessment/Plan: 1. Functional deficits which require 3+ hours per day of interdisciplinary therapy in a comprehensive inpatient rehab setting. Physiatrist is providing close team supervision and 24 hour management of active medical problems listed below. Physiatrist and rehab team continue to assess barriers to discharge/monitor patient progress toward functional and medical goals  Care Tool:  Bathing    Body parts bathed by patient: Left arm, Chest, Abdomen,  Front perineal area, Face, Right upper leg, Left upper leg, Right lower leg, Left lower leg   Body parts bathed by helper: Buttocks, Right arm     Bathing assist Assist Level: Maximal Assistance - Patient 24 - 49%     Upper Body Dressing/Undressing Upper body dressing   What is the patient wearing?: Pull over shirt    Upper body assist Assist Level: Maximal Assistance - Patient 25 - 49%    Lower Body Dressing/Undressing Lower body dressing      What is the patient wearing?: Pants     Lower body assist Assist for lower body dressing: 2 Helpers     Toileting Toileting    Toileting assist Assist for toileting: Total Assistance - Patient < 25%     Transfers Chair/bed transfer  Transfers assist   Chair/bed transfer activity did not occur: Safety/medical concerns  Chair/bed transfer assist level: 2 Helpers     Locomotion Ambulation   Ambulation assist      Assist level: 2 helpers Assistive device: Walker-rolling Max distance: 12'   Walk 10 feet activity   Assist     Assist level: 2 helpers Assistive device: Walker-rolling   Walk 50 feet activity   Assist Walk 50 feet with 2 turns activity did not occur: Safety/medical concerns         Walk 150 feet activity   Assist Walk 150 feet activity did not occur: Safety/medical concerns         Walk 10 feet on uneven surface  activity   Assist Walk 10 feet on uneven surfaces activity did not occur: Safety/medical concerns         Wheelchair     Assist Is the patient using a wheelchair?: Yes Type of Wheelchair: Manual    Wheelchair assist level: Minimal Assistance - Patient > 75% Max wheelchair distance: 68'    Wheelchair 50 feet with 2 turns activity    Assist        Assist Level: Minimal Assistance - Patient > 75%   Wheelchair 150 feet activity     Assist      Assist Level: Maximal Assistance - Patient 25 - 49%   Blood pressure 136/83, pulse 65, temperature 97.8 F (36.6 C), temperature source Oral, resp. rate 18, height 5\' 11"  (1.803 m), weight 89 kg, SpO2 95 %.  Medical Problem List and Plan: 1. 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 Pt with subsequent left greater than right weakness and sensory loss particularly involving the lower extremities.              -patient may shower             -ELOS/Goals: 12-16 days  - Goals Max A/Total A, caregiver goals now - 08/14/22  DC date             - Continue CIR PT/OT/SLP   - Per family request, HIV/AIDs diagnosis to remain confidential between wife and patient; do not inform other family  - 6/18: Per wife needs intermittent SPV goals,  not realistic given therapies. He does well with cueing. Max A with sitting balance, transfers, and standing. Requires a lot skilled, verbal, and tactile cues for transferring due to no carryover during and between sessions. LLE shooting out. Burden of care remains high; will work more on STS and transfers than higher-level dynamics. Per SLP on full diet working on speech intelligibility, will do memory eval.  -  6/21: Updated wife on functional prognosis with PML, enforced need for ongoing support at home and anticipate some ongoing functional decline.  Must stay compliant with antiretroviral therapy.  Will discuss with team possibility of powered mobility, given her own functional deficits.   - 6/24: Worsening overall deficits in spasticity, vision, and nausea/vomitting over the weekend; expect progression of PML. MRI w/ and w/o brain pending; Neurology consulted for recommentations  - 6/25: Further cognitive and physical decline, poor safety awareness, D3 diet, spasticity seems somewhat better today.  Awaiting a.m. labs, suspect worsening hyponatremia, start IV fluids 100 cc/h.  - 6/26: MUCH improved cognition/hemineglect with medication adjustments and sodium correction   - 6/27: Per teams, will be heavy assist at home, unsure if family will be able to accommodate safely.  Patient and family extremely motivated for discharge home.  Had discussion with wife, patient; both agreeable to palliative care consult for goals of care and dispo planning discussion.   - 6/28: Palliative consult  - prognosis 6-12 months; rec'd home with palliative nursing and option to transition to hospice - Therapies and medical team with substantial concerns regarding wife's ability to provide care in the home setting, will extend length of stay through Monday next week to provide as many opportunities for hand on caregiver training as possible.  Team overall recommending SNF given substantial burden of care, however family  extremely motivated for 1 to 2-week trial of discharge home.   - [employer] [facility] eval 7/3  2.  Antithrombotics: -DVT/anticoagulation:  Pharmaceutical: Eliquis             -antiplatelet therapy: none   3. 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  Scheduled Tylenol 1000 mg 3 times daily, add tramadol 50 mg twice daily as needed.   - 6/28: D/t medication issues with tramadol and ongoing pain, added oxycodone 5 mg BID PRn   6/29- pt reports meds helping L hip pain  4. Mood/Behavior/Sleep: LCSW to evaluate and provide emotional support             -antipsychotic agents: n/a   5. Neuropsych/cognition: This patient is capable of making decisions on his own behalf.   - SLP eval   - Neuropsych eval 6/18: While the patient clearly had some cognitive issues most of these were noted having to do with patterns consistent with midbrain and subcortical dysfunction and dysregulation. Patient denied significant depression or anxiety and denied significant coping difficulties with his current hospitalization although he is anxious about how much she will be able to improve even with therapies.    - 6/21: Per wife, increasing lethargy, depression, and mood lability at home prior to admission.  Possibly signs of depression secondary to cognitive injury and functional decline.  Initiate activating SSRI Lexapro 10 mg daily.  - 6/25: Discontinue Lexapro, tramadol due to potential serotonergic drug interaction and SIADH contributing to emesis, confusion.   - 6/26: Per wife, patient's daughter passed suddenly this AM; team aware, will provide support as needed, neuropsych informed - appreciate recommendations  - 6/28: neuropsych saw patient, appreciate Dr. Marvetta Gibbons assistance in this patient's care  - 7/1: Resume Lexapro 10 mg, retimed for nightly.  Will monitor sodium.    6. Skin/Wound  Care: Routine skin care checks  - Sacral stage 2 - offloading with foam and bed mobility  - Penile wounds - keep dry and monitor  7. Fluids/Electrolytes/Nutrition: Routine Is and Os and follow-up chemistries             -carb modified diet   - 6/14: Eating 25-100% meals. SLP dysphagia screen d/t mild dysarthria today.  -6/15  30- 100% intake, continue to monitor   -6/16 Ensure max daily ordered   - 6/24: Na 130; fluid restriction -> 132 w/ IVF -> 133-> 135 6/27   - 6/27: Discontinue IV fluid, transition to salt tabs 1 g twice daily.  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 fib: continue Eliquis for PE  -HR controlled   15: DM-2; A1c = 6.6% on 04/10/2022; (no home meds listed)             -serum glucose range: 89-119             -continue carb modified diet  - No CBGs, monitor BID AC for now  - 6/16-17 very well-controlled; can consider DC if remain low with adequate PO   - 6/18: well controlled, POs 25-75%, DC CBG 6/19   - 6/26: Ketones in UA yesterday; BMP glucose remain <200; POs poor - working on POs today  -6/27: Feels much improved, BMP glucose normal, no further monitoring   16: Acute PE and right lower extremity DVT             -continue Eliquis -> 10 to 5 mg BID dosing 6/17    17: Obesity: BMI 32    18: Chest pain 6/11>>negative trop x 2; EKG non-acute             -attributed to PE versus musculoskeletal    - 6/14: Improved with lidoderm patch. Encourage incentive spirometry.  -6/16 reports this is improved today.   19. Constipation. Schedule Sennakot-S 1 tab QHS and add PRN Miralax daily 17 g.   -6/15 reports improved with BM yesterday   -6/16 denies feeling constipated - add Miralax 17g BID    - LBM 6/23, large after at bedtime suppository - patient reports LBM 6/26; monitor  6/30- per nursing, did chart review, LBM was actually 6/23- will give Suppository today to get him to have BM   - 6/30 large BM with suppository; increase sennakots S to 2 tabs BID  7-3: Last BM, large, liquid  20. Spasticity. Extensor tone in [name] and LLE per therapies. - 6/18:  added Baclofen 5 mg TID. Consider PRAFO.  - 6/19: Benefit with ongoing mild spasticity, increased to 10 mg twice daily - Continue 10 mg TID - 6/25: Increased baclofen to 15 mg TID  - 6/27: Tone improving, will decrease to 10 mg 3 times daily to reduce confounding confusion - 6/28: Mildly increased tone, however comparable with prior days; monitor - 7/1:  Tone seems improved everywhere except for left hip; encouraged use of oxycodone, reduce baclofen to 5 mg 3 times daily due to cognition and constipation -encouraged use of oxycodone as needed 7-2 7-3: Continue oxycodone as needed, spasticity medications, stretching and heat  21: Penile wounds, suspected HSV.  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  Completed treatment with valacyclovir 1000 mg twice daily for 14 days. Discussed with pharmacy that treatmenrt has been completed  22. Emesis: scopolamine patch ordered, improved, will d/c -> resume 6/24; she endorses improvement but continues to require as needed Zofran, worsening emesis.  Resolved -KUB without obstruction, severe constipation - scopalamine patch, Zofran PRN - No emesis recorded since 6/25 AM; did get IV yesterday afternoon - IVF as above; minimal POs last 2 days - 6/26: No PRNS today, continue Scopalamine patch and DC in AM if tolerating  POs -6/27: No further as needed Zofran, DC scopolamine patch.  Tolerating p.o.'s better  6/29-6/30 pt denied nausea-eating breakfast heartily this AM  23. Right ear decreased hearing: debrox ear drops ordered, improved, will d/c  6/27; resume Debrox stops for 3 days -resolved  24.  Encephalopathy/altered mental status -overall improved, continues some waxing and waning   -Neurology consulted, MRI with and without yesterday without any worsening of PML, low suspicion for IRIS at this time but will monitor closely.  Appreciate their recommendations.  -Has remained afebrile, normotensive, without gross signs of infection; however immunocompromise due to AIDs.  Will get a urine with reflex to culture, chest x-ray, KUB today.  -DC tramadol Lexapro due to possible serotonergic effects + SIADH potential causing altered mental status, worsening tone, and emesis.  -Labs yesterday significant for hyponatremia 130; placed on fluid restriction, pending labs today, start IV fluids 100 cc/h  -If no apparent identifiable cause, will consult ID for further recommendations   - 6/26: Much improved with sodium increase as above; continue current Tx, BMP in AM, transition to PO fluids and salt tabs in AM  6/29- Na 135 this AM  25.  Urinary incontinence, intermittent. May be worsened due to IV fluids   -  UA negative, PVRs low, monitor  6/29- rechecked due to hematuria- had some large Hb in U/A however is negative otherwise.   7-1: See above, hematuria started with catheterization for urinalysis, likely traumatic cath and ongoing slow bleed due to Eliquis.  Hemoglobin has remained stable, can follow-up further as outpatient.  7-2: Per review, no gross hematuria, remains somewhat dark-colored but improving.    LOS: 21 days A FACE TO FACE EVALUATION WAS PERFORMED  Angelina Sheriff 08/09/2022, 1:01 PM

## 2022-08-09 NOTE — Plan of Care (Signed)
Goals downgraded due to fluctuating levels of functioning and inconsistent progress   Problem: RH Swallowing Goal: LTG Patient will consume least restrictive diet using compensatory strategies with assistance (SLP) Description: LTG:  Patient will consume least restrictive diet using compensatory strategies with assistance (SLP) Flowsheets (Taken 08/09/2022 0618) LTG: Pt Patient will consume least restrictive diet using compensatory strategies with assistance of (SLP): Minimal Assistance - Patient > 75%   Problem: RH Expression Communication Goal: LTG Patient will increase speech intelligibility (SLP) Description: LTG: Patient will increase speech intelligibility at word/phrase/conversation level with cues, % of the time (SLP) Flowsheets (Taken 08/09/2022 0618) Level: Phrase   Problem: RH Problem Solving Goal: LTG Patient will demonstrate problem solving for (SLP) Description: LTG:  Patient will demonstrate problem solving for basic/complex daily situations with cues  (SLP) Flowsheets (Taken 08/09/2022 0618) LTG Patient will demonstrate problem solving for: Moderate Assistance - Patient 50 - 74%

## 2022-08-09 NOTE — Progress Notes (Signed)
Speech Language Pathology Weekly Progress and Session Note  Patient Details  Name: Osagie Doskocil MRN: [id number] Date of Birth: [birth year]  Beginning of progress report period: August 03, 2022 End of progress report period: August 09, 2022  Today's Date: 08/09/2022 SLP Individual Time: 4098-1191 SLP Individual Time Calculation (min): 40 min  Short Term Goals: Week 3: SLP Short Term Goal 1 (Week 3): Patient will consume current diet with supervision for use of swallowing compensatory strategies. SLP Short Term Goal 1 - Progress (Week 3): Met SLP Short Term Goal 2 (Week 3): Patient will utilize speech intelligibility strategies at the sentence level to achieve ~90% intelligibility with min A verbal cues. SLP Short Term Goal 2 - Progress (Week 3): Not met SLP Short Term Goal 3 (Week 3): Patient will demonstrate functional problem solving for basic tasks with supervision verbal cues. SLP Short Term Goal 3 - Progress (Week 3): Not met SLP Short Term Goal 4 (Week 3): Patient will recall two compensatory memory strategies with verbal min cues. SLP Short Term Goal 4 - Progress (Week 3): Not met    New Short Term Goals: Week 4: SLP Short Term Goal 1 (Week 4): STGs=LTGs due to ELOS  Weekly Progress Updates: Patient continues to make inconsistent gains and has met 1 of 4 STGs this reporting period. Currently, patient requires overall Min-Mod A verbal cues for use of speech intelligibility strategies at the sentence level in order to achieve ~70% intelligibility. Mod A verbal cues are also needed at this time for functional problem solving during functional and familiar tasks. Patient is consuming Dys. 3 textures with thin liquids with minimal overt s/s of aspiration and requires Min verbal cues for use of swallowing compensatory strategies. Patient and family education ongoing. Patient would benefit from continued skilled SLP intervention to maximize his cognitive and swallowing functioning as well as his  speech intelligibility prior to discharge.     Intensity: Minumum of 1-2 x/day, 30 to 90 minutes Frequency: 1 to 3 out of 7 days Duration/Length of Stay: 08/14/22 Treatment/Interventions: Cognitive remediation/compensation;Cueing hierarchy;Dysphagia/aspiration precaution training;Environmental controls;Functional tasks;Internal/external aids;Patient/family education;Speech/Language facilitation;Therapeutic Activities   Daily Session  Skilled Therapeutic Interventions:   Skilled treatment session focused on communication goals. Upon arrival, patient was awake in bed but appeared fatigued due to a busy morning of family education with PT/OT. SLP facilitated session by providing Mod A verbal and visual cues for use of diaphragmatic breathing at the word level. Patient utilized strategies at the word level with supervision level verbal cues but required Min verbal cues for use at the phrase level and Mod A multimodal cues for use at the sentence level during structured language tasks. Overall, patient was ~70% intelligible during an informal conversation. Patient left upright in bed with all needs within reach. Continue with current plan of care.    Pain No/Denies Pain   Therapy/Group: Individual Therapy  Rithwik Schmieg 08/09/2022, 6:30 AM

## 2022-08-09 NOTE — Progress Notes (Signed)
Occupational Therapy Session Note  Patient Details  Name: Jose Lamb MRN: [id number] Date of Birth: 07-22-1960  Today's Date: 08/09/2022 OT Individual Time: 4098-1191 OT Individual Time Calculation (min): 75 min    Short Term Goals: Week 3:  OT Short Term Goal 1 (Week 3): Pt will complete slide board transfer with Mod A OT Short Term Goal 2 (Week 3): Pt will complete bed mobility rolling L and R with Min A OT Short Term Goal 3 (Week 3): Pt will don pants supine with Mod A      Skilled Therapeutic Interventions/Progress Updates:    Pt received lying supine in bed.  Pt stated that he was not feeling the best today.  No c/o pain but did have c/o of nausea.  Pt family was present for family education.  Wife and friend Jose Lamb.  Self care completed at bed level.  Pt able to complete peri care with Max A and Mod Verbal cues for rolling R and L in bed.  Wife was able to demonstrate LB dressing techniques.  Pt still required Max A and mod verbal cueing for bridging and foot/leg placement.  Demonstration for getting pt from supine to EOB provided and pt able to complete with Max A +2 for stability.  Pt did a great job with powering up to the L side of the bed.  Pt able to stabilize self with use of R arm and rail at the end of the bed and at this time OT/OTS only providing Mod A for stability.  Pt back in supine position and able to scoot up to [facility] with CGA using head board for support.  OT provided wife with visual of TIS wheelchair in preparation for wheelchair evaluation later this afternoon. Education on removable ramp discussed and description sent to wife.  Family did not have any further questions this session.  Pt left in supine, HOB elevated, wife and friend still present at end of session.  Call light within reach and all needs met.     Therapy Documentation Precautions:  Precautions Precautions: Fall Precaution Comments: Lt hemi, poor sitting/standing balance Restrictions Weight Bearing  Restrictions: No     Therapy/Group: Individual Therapy  [name] 08/09/2022, 11:23 AM

## 2022-08-09 NOTE — Progress Notes (Signed)
Occupational Therapy Weekly Progress Note  Patient Details  Name: Jacobi Ratliff MRN: [id number] Date of Birth: [birth year]  Beginning of progress report period: August 02, 2022 End of progress report period: August 09, 2022   Patient has met 0 of 3 short term goals.  Pt plan is to d/c home with wife. Recommending Total A transfers with hoyer lift and ADL care at bed level. Family education is currently being completed.  Patient continues to demonstrate the following deficits: muscle weakness, impaired timing and sequencing, abnormal tone, unbalanced muscle activation, ataxia, decreased coordination, and decreased motor planning, decreased midline orientation, decreased attention to left, left side neglect, and decreased motor planning, decreased initiation, decreased attention, decreased awareness, decreased problem solving, decreased safety awareness, decreased memory, and delayed processing, central origin, and decreased sitting balance, decreased standing balance, decreased postural control, hemiplegia, decreased balance strategies, and difficulty maintaining precautions and therefore will continue to benefit from skilled OT intervention to enhance overall performance with BADL.  Patient not progressing toward long term goals.  See goal revision..  Plan of care revisions: Goals downgraded to Max A/caregiver level.  OT Short Term Goals  Week 4:  OT Short Term Goal 1 (Week 4): LTG=STG d/t ELOS         [name] 08/09/2022, 12:15 PM

## 2022-08-09 NOTE — Progress Notes (Signed)
Physical Therapy Session Note  Patient Details  Name: Jose Lamb MRN: [id number] Date of Birth: 17-May-1960  Today's Date: 08/09/2022 PT Individual Time: 1002-1059 and 1400-1515 PT Individual Time Calculation (min): 57 min and 75 min  Short Term Goals: Week 3:  PT Short Term Goal 1 (Week 3): STGs = LTGs  Skilled Therapeutic Interventions/Progress Updates:    1st Session: Pt received supine in bed with NT present due to pt having vomited on himself and in process of being cleaned. RN aware and provides pt with nausea and pain medicine to address Lt hip pain. PT also provides repositioning to address pain. PT assists with doffing soiled shirt and donning clean shirt prior to mobility. Pt's wife and family friend present for family education. Wife tasked with assisting pt from bed to [facility] and back with use of manual hoyer lift, with PT present for cueing as necessary. Wife able to roll pt from R<>L to position sling, maneuver hoyer lift to optimal position with PT cues for safety, and perform hoyer lift transfer without PT providing any physical assistance. PT demonstrates method for removal of sling and placing sling in WC, but at this time wife does not perform sling placement while in WC. Wife then assists for transfer back to bed and pt performs bilateral rolling to remove sling. PT assists with positioning of pt in bed for comfort and pressure relief. Pt left semi reclined with all needs within reach.  2nd Session: Pt received supine in bed and agrees to therapy. Reports pain in Lt hip. Pt provides rest breaks and mobility to manage pain. Pt performs supine to sit with maxA/totalA and cues for logrolling, hand placement, and body mechanics. Pt performs stand pivot transfer from bed to [facility] on Rt side ot limit Lt hip pain, with maxA to facilitate anterior weight shift and powering up, with cues for initiation and sequencing. WC transport to gym. PT and OT both present for [facility] evaluation with Harrie Jeans from [employer]. Pt performs stand pivot transfer from [facility] to mat table with maxA+2 and similar cueing. Pt remains in short sitting for much of WC evaluation, with OT positioned behind pt on theraball, providing tactile feedback through shoulders and trunk for neutral positioning and light anterior thoracic soft tissue stretch. PT provides frequent cues for neutral cervical posture and limiting RUE pushing to the Lt. Pt transfers back to [facility] with maxA +2. Pt participates in NMR for Lt sided attention and score strengthening, tasked with retrieving squigz placed on mirror in Pt's left visual field. Pt requires min cueing to attend to Lt side but max cueing and max manual assistance for trunk control, as his tendency is to drift further and further Lt, with limited ability for self correction. WC transport back to room. TotalA for stand pivot back to bed and sit to supine. Pt positioned for comfort and pressure relief. Left semi reclined with call bell in reach.   Therapy Documentation Precautions:  Precautions Precautions: Fall Precaution Comments: Lt hemi, poor sitting/standing balance Restrictions Weight Bearing Restrictions: No   Therapy/Group: Individual Therapy  Beau Fanny, PT, DPT 08/09/2022, 4:41 PM

## 2022-08-10 ENCOUNTER — Inpatient Hospital Stay (HOSPITAL_COMMUNITY): Payer: BC Managed Care – PPO

## 2022-08-10 DIAGNOSIS — A812 Progressive multifocal leukoencephalopathy: Secondary | ICD-10-CM | POA: Diagnosis not present

## 2022-08-10 LAB — CBC WITH DIFFERENTIAL/PLATELET
Abs Immature Granulocytes: 0.23 10*3/uL — ABNORMAL HIGH (ref 0.00–0.07)
Basophils Absolute: 0.1 10*3/uL (ref 0.0–0.1)
Basophils Relative: 1 %
Eosinophils Absolute: 0.2 10*3/uL (ref 0.0–0.5)
Eosinophils Relative: 4 %
HCT: 41.4 % (ref 39.0–52.0)
Hemoglobin: 13.7 g/dL (ref 13.0–17.0)
Immature Granulocytes: 5 %
Lymphocytes Relative: 31 %
Lymphs Abs: 1.4 10*3/uL (ref 0.7–4.0)
MCH: 29.7 pg (ref 26.0–34.0)
MCHC: 33.1 g/dL (ref 30.0–36.0)
MCV: 89.8 fL (ref 80.0–100.0)
Monocytes Absolute: 0.6 10*3/uL (ref 0.1–1.0)
Monocytes Relative: 13 %
Neutro Abs: 2 10*3/uL (ref 1.7–7.7)
Neutrophils Relative %: 46 %
Platelets: 146 10*3/uL — ABNORMAL LOW (ref 150–400)
RBC: 4.61 MIL/uL (ref 4.22–5.81)
RDW: 14.9 % (ref 11.5–15.5)
WBC: 4.3 10*3/uL (ref 4.0–10.5)
nRBC: 0 % (ref 0.0–0.2)

## 2022-08-10 LAB — COMPREHENSIVE METABOLIC PANEL
ALT: 27 U/L (ref 0–44)
AST: 24 U/L (ref 15–41)
Albumin: 3.7 g/dL (ref 3.5–5.0)
Alkaline Phosphatase: 57 U/L (ref 38–126)
Anion gap: 10 (ref 5–15)
BUN: 18 mg/dL (ref 6–20)
CO2: 25 mmol/L (ref 22–32)
Calcium: 9.5 mg/dL (ref 8.9–10.3)
Chloride: 102 mmol/L (ref 98–111)
Creatinine, Ser: 1.42 mg/dL — ABNORMAL HIGH (ref 0.61–1.24)
GFR, Estimated: 57 mL/min — ABNORMAL LOW (ref >60)
Glucose, Bld: 122 mg/dL — ABNORMAL HIGH (ref 70–99)
Potassium: 4.2 mmol/L (ref 3.5–5.1)
Sodium: 137 mmol/L (ref 135–145)
Total Bilirubin: 0.4 mg/dL (ref 0.3–1.2)
Total Protein: 7.4 g/dL (ref 6.5–8.1)

## 2022-08-10 MED ORDER — SODIUM CHLORIDE 0.9 % IV SOLN
INTRAVENOUS | Status: AC
  Administered 2022-08-10: 75 mL/h via INTRAVENOUS

## 2022-08-10 MED ORDER — TIZANIDINE HCL 2 MG PO TABS
2.0000 mg | ORAL_TABLET | Freq: Three times a day (TID) | ORAL | Status: DC
  Administered 2022-08-10 – 2022-08-14 (×13): 2 mg via ORAL
  Filled 2022-08-10 (×13): qty 1

## 2022-08-10 MED ORDER — SORBITOL 70 % SOLN
15.0000 mL | Freq: Every day | Status: DC | PRN
  Filled 2022-08-10: qty 30

## 2022-08-10 NOTE — Progress Notes (Signed)
PROGRESS NOTE   Subjective/Complaints:  No events overnight.  Patient complaining of left hip pain this morning, indicates pain is coming from his left groin.  States the pain is worse with weightbearing and movement.  Has not appreciated a significant difference with baclofen.  Is agreeable to x-ray today to look for hip pathology, consideration of joint injection versus Botox, will discuss with his wife before proceeding.  Also, some nausea this a.m. on chart review, got p.o. Zofran and IV Zofran over the last 2 days.  States he got a pain pill about an hour ago.  Use of Zofran does not appear associated with food intakes, or pain medication.  AKI this AM Cr 0.8 -> 1.4; POs appear adequate, no major medication changes  ROS:  + L hip pain-ongoing, intermittent + R hearing deficit-resolved + vision deficit - intermittent + Hematuria- improving + Constipation-intermittent, ongoing + [place] -recurrent Denies fevers, chills, N/V, abdominal pain, diarrhea, SOB, cough, chest pain, new weakness or paraesthesias.    Except for HPI Objective:   No results found. Recent Labs    08/10/22 0546  WBC 4.3  HGB 13.7  HCT 41.4  PLT 146*      Recent Labs    08/10/22 0546  NA 137  K 4.2  CL 102  CO2 25  GLUCOSE 122*  BUN 18  CREATININE 1.42*  CALCIUM 9.5      Intake/Output Summary (Last 24 hours) at 08/10/2022 0844 Last data filed at 08/10/2022 0742 Gross per 24 hour  Intake 640 ml  Output 925 ml  Net -285 ml          Physical Exam: Vital Signs Blood pressure (!) 141/92, pulse 75, temperature 97.9 F (36.6 C), resp. rate 18, height 5\' 11"  (1.803 m), weight 89 kg, SpO2 97 %.     General: awake, alert, appropriate.  Laying in bed, working with PT HENT: conjugate gaze; oropharynx moist CV: regular rate and irregular rhythm; no JVD Pulmonary: CTA B/L; no W/R/R- good air movement GI: soft, NT, ND,  hypoactive Psychiatric: Flat affect, sluggish, appropriate mood Neurological: alert-mildly lethargic but stable Skin: C/D/I. No apparent lesions.    + Stage 2 to sacrum   MSK:      No apparent deformity.      Strength: Moving bilateral upper extremities antigravity against resistance; right lower extremity within normal limits; left lower extremity limited by pain and hemineglect  LLE: MAS 2-3 hip flexors, MAS 1 knee extensors;  MAS 1 elbow flexor, MAS 1 shoulder adductor   Neurologic exam:  Cognition: AAO x 3,   + left hemineglect  - can attend with minimal effort;  + Delayed recall - ongoing + Delayed motor initiation - ongoing + Moderate cognitive deficit - ongoing -   Language: Fluent, No substitutions or neoglisms. Mild dysarthria.  Insight: Good insight into current condition; does wax and wane somewhat Mood: Flat affect.  Sensation:  decreased in left upper and lower extremity CN:+ L sensory loss L V1-3,  Mild facial droop apparent when smiling.   MSK: Positive left groin pain on FABER and FADIR; extremely limited hip internal rotation due to spasticity and guarding  Assessment/Plan: 1. Functional deficits which require 3+ hours per day of interdisciplinary therapy in a comprehensive inpatient rehab setting. Physiatrist is providing close team supervision and 24 hour management of active medical problems listed below. Physiatrist and rehab team continue to assess barriers to discharge/monitor patient progress toward functional and medical goals  Care Tool:  Bathing    Body parts bathed by patient: Left arm, Chest, Abdomen, Front perineal area, Face, Right upper leg, Left upper leg, Right lower leg, Left lower leg   Body parts bathed by helper: Buttocks, Right arm     Bathing assist Assist Level: Maximal Assistance - Patient 24 - 49%     Upper Body Dressing/Undressing Upper body dressing   What is the patient wearing?: Pull over shirt    Upper body assist  Assist Level: Maximal Assistance - Patient 25 - 49%    Lower Body Dressing/Undressing Lower body dressing      What is the patient wearing?: Pants     Lower body assist Assist for lower body dressing: 2 Helpers     Toileting Toileting    Toileting assist Assist for toileting: Total Assistance - Patient < 25%     Transfers Chair/bed transfer  Transfers assist  Chair/bed transfer activity did not occur: Safety/medical concerns  Chair/bed transfer assist level: Maximal Assistance - Patient 25 - 49%     Locomotion Ambulation   Ambulation assist      Assist level: 2 helpers Assistive device: Walker-rolling Max distance: 12'   Walk 10 feet activity   Assist     Assist level: 2 helpers Assistive device: Walker-rolling   Walk 50 feet activity   Assist Walk 50 feet with 2 turns activity did not occur: Safety/medical concerns         Walk 150 feet activity   Assist Walk 150 feet activity did not occur: Safety/medical concerns         Walk 10 feet on uneven surface  activity   Assist Walk 10 feet on uneven surfaces activity did not occur: Safety/medical concerns         Wheelchair     Assist Is the patient using a wheelchair?: Yes Type of Wheelchair: Manual    Wheelchair assist level: Minimal Assistance - Patient > 75% Max wheelchair distance: 55'    Wheelchair 50 feet with 2 turns activity    Assist        Assist Level: Minimal Assistance - Patient > 75%   Wheelchair 150 feet activity     Assist      Assist Level: Maximal Assistance - Patient 25 - 49%   Blood pressure (!) 141/92, pulse 75, temperature 97.9 F (36.6 C), resp. rate 18, height 5\' 11"  (1.803 m), weight 89 kg, SpO2 97 %.  Medical Problem List and Plan: 1. 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 Pt with subsequent left greater than right weakness and sensory  loss particularly involving the lower extremities.              -patient may shower             -ELOS/Goals: 12-16 days  - Goals Max A/Total A, caregiver goals now - 08/14/22  DC date             - Continue CIR PT/OT/SLP   - Per family request, HIV/AIDs diagnosis to remain confidential between wife and patient; do not inform other family  - 6/18: Per  wife needs intermittent SPV goals, not realistic given therapies. He does well with cueing. Max A with sitting balance, transfers, and standing. Requires a lot skilled, verbal, and tactile cues for transferring due to no carryover during and between sessions. LLE shooting out. Burden of care remains high; will work more on STS and transfers than higher-level dynamics. Per SLP on full diet working on speech intelligibility, will do memory eval.  - 6/21: Updated wife on functional prognosis with PML, enforced need for ongoing support at home and anticipate some ongoing functional decline.  Must stay compliant with antiretroviral therapy.  Will discuss with team possibility of powered mobility, given her own functional deficits.   - 6/24: Worsening overall deficits in spasticity, vision, and nausea/vomitting over the weekend; expect progression of PML. MRI w/ and w/o brain pending; Neurology consulted for recommentations  - 6/25: Further cognitive and physical decline, poor safety awareness, D3 diet, spasticity seems somewhat better today.  Awaiting a.m. labs, suspect worsening hyponatremia, start IV fluids 100 cc/h.  - 6/26: MUCH improved cognition/hemineglect with medication adjustments and sodium correction   - 6/27: Per teams, will be heavy assist at home, unsure if family will be able to accommodate safely.  Patient and family extremely motivated for discharge home.  Had discussion with wife, patient; both agreeable to palliative care consult for goals of care and dispo planning discussion.   - 6/28: Palliative consult  - prognosis 6-12 months; rec'd home  with palliative nursing and option to transition to hospice - Therapies and medical team with substantial concerns regarding wife's ability to provide care in the home setting, will extend length of stay through Monday next week to provide as many opportunities for hand on caregiver training as possible.  Team overall recommending SNF given substantial burden of care, however family extremely motivated for 1 to 2-week trial of discharge home.   - [employer] WC eval 7/3  2.  Antithrombotics: -DVT/anticoagulation:  Pharmaceutical: Eliquis             -antiplatelet therapy: none   3. 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  Scheduled Tylenol 1000 mg 3 times daily, add tramadol 50 mg twice daily as needed.   - 6/28: D/t medication issues with tramadol and ongoing pain, added oxycodone 5 mg BID PRn   6/29- pt reports meds helping L hip pain  4. Mood/Behavior/Sleep: LCSW to evaluate and provide emotional support             -antipsychotic agents: n/a   5. Neuropsych/cognition: This patient is capable of making decisions on his own behalf.   - SLP eval   - Neuropsych eval 6/18: While the patient clearly had some cognitive issues most of these were noted having to do with patterns consistent with midbrain and subcortical dysfunction and dysregulation. Patient denied significant depression or anxiety and denied significant coping difficulties with his current hospitalization although he is anxious about how much she will be able to improve even with therapies.    - 6/21: Per wife, increasing lethargy, depression, and mood lability at home prior to admission.  Possibly signs of depression secondary to cognitive injury and functional decline.  Initiate activating SSRI Lexapro 10 mg daily.  - 6/25: Discontinue Lexapro, tramadol due to potential serotonergic drug interaction and  SIADH contributing to emesis, confusion.   - 6/26: Per wife, patient's daughter passed suddenly this  AM; team aware, will provide support as needed, neuropsych informed - appreciate recommendations  - 6/28: neuropsych saw patient, appreciate Dr. Marvetta Gibbons assistance in this patient's care  - 7/1: Resume Lexapro 10 mg, retimed for nightly.  Will monitor sodium.  -7-4: Increasing nausea, lethargy; treatment as below, may need to DC Lexapro if ongoing   6. Skin/Wound Care: Routine skin care checks  - Sacral stage 2 - offloading with foam and bed mobility  - Penile wounds - keep dry and monitor   7. Fluids/Electrolytes/Nutrition: Routine Is and Os and follow-up chemistries             -carb modified diet   - 6/14: Eating 25-100% meals. SLP dysphagia screen d/t mild dysarthria today.  -6/15  30- 100% intake, continue to monitor   -6/16 Ensure max daily ordered   - 6/24: Na 130; fluid restriction -> 132 w/ IVF -> 133-> 135 6/27   - 6/27: Discontinue IV fluid, transition to salt tabs 1 g twice daily.  Recheck BMP 6/29  - 6/29- up to 135- con't to monitor  - 7/1 - Na 137; uptrending, reduce to 1 g daily and monitor Na in 2-3 days  - 7/4: Na stable 136; AKI Cr 0.8 -> 1.4; start 75 cc/hr IVF and recheck in AM. Encourage PO fluids (drinking well under restriction).  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 fib: continue Eliquis for PE  -HR controlled   15: DM-2; A1c = 6.6% on 04/10/2022; (no home meds listed)             -serum glucose range: 89-119             -continue  carb modified diet  - No CBGs, monitor BID AC for now  - 6/16-17 very well-controlled; can consider DC if remain low with adequate PO   - 6/18: well controlled, POs 25-75%, DC CBG 6/19   - 6/26: Ketones in UA yesterday; BMP glucose remain <200; POs poor - working on POs today  -6/27: Feels much improved, BMP glucose normal, no further monitoring   16: Acute PE and right lower extremity DVT             -continue Eliquis -> 10 to 5 mg BID dosing 6/17    17: Obesity: BMI 32    18: Chest pain 6/11>>negative trop x 2; EKG non-acute             -attributed to PE versus musculoskeletal   - 6/14: Improved with lidoderm patch. Encourage incentive spirometry.  -6/16 reports this is improved today.   19. Constipation. Schedule Sennakot-S 1 tab QHS and add PRN Miralax daily 17 g.   -6/15 reports improved with BM yesterday   -6/16 denies feeling constipated - add Miralax 17g BID    - LBM 6/23, large after at bedtime suppository - patient reports LBM 6/26; monitor  6/30- per nursing, did chart review, LBM was actually  6/23- will give Suppository today to get him to have BM   - 6/30 large BM with suppository; increase sennakots S to 2 tabs BID   - 7-3: Last BM, large, liquid  20. Spasticity/L hip pain. Extensor tone in [name] and LLE per therapies. - 6/18:  added Baclofen 5 mg TID. Consider PRAFO.  - 6/19: Benefit with ongoing mild spasticity, increased to 10 mg twice daily - Continue 10 mg TID - 6/25: Increased baclofen to 15 mg TID  - 6/27: Tone improving, will decrease to 10 mg 3 times daily to reduce confounding confusion - 6/28: Mildly increased tone, however comparable with prior days; monitor - 7/1: Tone seems improved everywhere except for left hip; encouraged use of oxycodone, reduce baclofen to 5 mg 3 times daily due to cognition and constipation -encouraged use of oxycodone as needed 7-2 7-3: Continue oxycodone as needed, spasticity medications, stretching and heat 7/4: Switch from  baclofen to tizanidine 2 mg TID d/t AKI, ongoing constipation, nausea.  Patient integrates groin as source of pain today, suggesting intra-articular hip pathology, however later complains of spasticity.  Will get x-ray, pending results may try Botox versus injection into right hip in AM.   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  Completed treatment with valacyclovir 1000 mg twice daily for 14 days. Discussed with pharmacy that treatmenrt has been completed  22. Emesis/Nausea/constipation: scopolamine patch ordered, improved, will d/c -> resume 6/24; she endorses improvement but continues to require as needed Zofran, worsening emesis.  Resolved -KUB without obstruction, severe constipation - scopalamine patch, Zofran PRN - No emesis recorded since 6/25 AM; did get IV yesterday afternoon - IVF as above; minimal POs last 2 days - 6/26: No PRNS today, continue Scopalamine patch and DC in AM if tolerating POs -6/27: No further as needed Zofran, DC scopolamine patch.  Tolerating p.o.'s better  6/29-6/30 pt denied nausea-eating breakfast heartily this AM  7/4: Appears daily nausea resumed about 2 days ago, no association with food intakes or medications.  May be related to spasticity in left leg.  Last BM 7-2, large, liquid; has had intermittent constipation and generally needing suppository for bowel movements.  Check KUB today, as needed sorbitol  23. Right ear decreased hearing: debrox ear drops ordered, improved, will d/c  6/27; resume Debrox stops for 3 days -resolved  24.  Encephalopathy/altered mental status -overall improved, continues some waxing and waning   -Neurology consulted, MRI with and without yesterday without any worsening of PML, low suspicion for IRIS at this time but will monitor closely.  Appreciate their recommendations.  -Has remained afebrile, normotensive, without gross signs of infection; however immunocompromise due to AIDs.  Will get a urine with reflex to  culture, chest x-ray, KUB today.  -DC tramadol Lexapro due to possible serotonergic effects + SIADH potential causing altered mental status, worsening tone, and emesis.  -Labs yesterday significant for hyponatremia 130; placed on fluid restriction, pending labs today, start IV fluids 100 cc/h  -If no apparent identifiable cause, will consult ID for further recommendations   - 6/26: Much improved with sodium increase as above; continue current Tx, BMP in AM, transition to PO fluids and salt tabs in AM  6/29- Na 135 this AM  25.  Urinary incontinence, intermittent. May be worsened due to IV fluids   -  UA negative, PVRs low, monitor  6/29- rechecked due to hematuria- had some large Hb in U/A however is negative otherwise.   7-1: See above,  hematuria started with catheterization for urinalysis, likely traumatic cath and ongoing slow bleed due to Eliquis.  Hemoglobin has remained stable, can follow-up further as outpatient.  7-2: Per review, no gross hematuria, remains somewhat dark-colored but improving.    LOS: 22 days A FACE TO FACE EVALUATION WAS PERFORMED  Angelina Sheriff 08/10/2022, 8:44 AM

## 2022-08-10 NOTE — Progress Notes (Signed)
Physical Therapy Session Note  Patient Details  Name: Jose Lamb MRN: [id number] Date of Birth: November 09, 1960  Today's Date: 08/10/2022 PT Individual Time: 0920-1030 PT Individual Time Calculation (min): 70 min   Short Term Goals: Week 3:  PT Short Term Goal 1 (Week 3): STGs = LTGs  Skilled Therapeutic Interventions/Progress Updates:    Pt presents in room in bed, reporting nausea and pain in L hip, premedicated. Pt agreeable to PT. Session focused on bed mobility, transfers, and NMR to decrease tone and pain in LLE, as well as to address coordination of trunk and attention to L side. Pt requires mod assist for rolling to L, max assist for rolling to R. Pt requires max assist x2 for sit<>stand in stedy.  Pt completes rolling for donning pants in supine with max assist, pt able to manage bridging with max assist for holding BLEs in hooklying position unable to coordinate bridging with using RUE to pull pants over hips, completed by rolling.  Pt tolerates NMR for stretching L hamstring  to reduce tone and pain in L hip. 3x60 sec. MD present and assesses pt for L hip pain.  Pt completes supine to sit EOB with max assist for BLEs off EOB and trunk to upright, pt with significant lateropulsion, poor midline orientation and difficulty following abstract cues to correct for posture requiring max assist for maintaining sitting on EOB. 2nd person assist for positioning in sitting EOB and for stand to sara stedy with max assist to maintain upright posture in stedy due to poor L foot positioning and unable to correct for L lateral lean. Pt positioned over WC and completes stand with max assist x2 pulling to upright with RUE on stedy. Pt completes sit with max assist x2 for proper positioning and controlling descent.  Pt transported to main gym dependently and positioned at table. Pt then completes NMR for trunk coordination, reaching outside BOS, and attendance to L side with pt reaching to L for cones and  stacking outside BOS on R side. Pt completes 2 trials, 6 cones. Pt provided with verbal cues to make eye contact with therapist on L side prior to reaching for cone with pt not looking prior to reaching for cones without cues. Pt able to respond to verbal commands for reaching for specific colors and demonstrates improved upright positioning and midline orientation following activity.  Pt then transported to dayroom and positioned in front of mirror. Pt completes trunk/neck adjustments to achieve midline with pt requiring min assist in supported sitting. Pt then completes stand in stedy with max assist x2 and maintains sitting with posterior supports to facilitate BLE weightbearing and trunk positioning without back support. Pt demonstrates L lateral lean requiring max assist to correct, 2nd person positioned on pt right side, verbal cues for pt to put arm around 2nd person on R side to decrease pushing behavior with RUE and encourage R lateral lean. Pt maintains upright position x3 min, demonstrates posterior lean with fatigue and requires max assist x2 to sit.  Pt returned to room dependently in [facility] and remains seated with all needs within reach, call light in place and RN present at end of session.   Therapy Documentation Precautions:  Precautions Precautions: Fall Precaution Comments: Lt hemi, poor sitting/standing balance Restrictions Weight Bearing Restrictions: No   Therapy/Group: Individual Therapy  Edwin Cap PT, DPT 08/10/2022, 12:45 PM

## 2022-08-10 NOTE — Progress Notes (Signed)
Occupational Therapy Session Note  Patient Details  Name: Jose Lamb MRN: [id number] Date of Birth: February 18, 1960  Today's Date: 08/10/2022 OT Individual Time: 4098-1191 OT Individual Time Calculation (min): 58 min     Skilled Therapeutic Interventions/Progress Updates: Patient received resting in bed. Agreeable to bed level therapy. Reporting continued pain at the L hip. Repositioned patient up in bed and added a pillow to the lateral aspect of the LLE to position the knee facing upward vs out. Discussed patient's position in bed in an eternal rotated position possibly contributing to the discomfort in the L-hip. Patient expressed understanding that a prolonged stretch can cause joint pain and understood if this was the cause it may take a few days for the pain and discomfort to subside if positioned neutral. Continued treatment with [name] AAROM working to improve proximal stability/strength. Patient with cues to attend to [name] with PNF movement patterns moderately assisted. Continued working distal towards supination/Pronation, wrist flexion and extension and thumb opposition. Patient with difficulty isolating thumb movements vs gross grasp release patterns. Continue with skilled OT POC to improve independence with self care and neurological recovery.     Therapy Documentation Precautions:  Precautions Precautions: Fall Precaution Comments: Lt hemi, poor sitting/standing balance Restrictions Weight Bearing Restrictions: No General: General PT Missed Treatment Reason: Xray Vital Signs: Therapy Vitals Temp: 98.3 F (36.8 C) Pulse Rate: 93 Resp: 17 BP: 124/76 Patient Position (if appropriate): Lying Oxygen Therapy SpO2: 99 % O2 Device: Room Air Pain: Reported mild pain at L hip      Therapy/Group: Individual Therapy  Warnell Forester 08/10/2022, 3:58 PM

## 2022-08-10 NOTE — Progress Notes (Signed)
Physical Therapy Session Note  Patient Details  Name: Jose Lamb MRN: [id number] Date of Birth: 1960/09/14  Today's Date: 08/10/2022 PT Individual Time: 1126-1206 PT Individual Time Calculation (min): 40 min   Short Term Goals: Week 3:  PT Short Term Goal 1 (Week 3): STGs = LTGs  Skilled Therapeutic Interventions/Progress Updates:   Pt went to treat patient at scheduled appt time of 11:00 AM but pt leaving for Xray. Pt checked back on patient at 1126 and he had just returned to room and was getting hooked up to his IV.  Pt received in his room post xray, agreeable to tx. No complaints of pain except with movement of his L hip.   Therex performed in bed secondary to fatigue from Xray. Quad sets x 20 each LE with Quad recruitment palpated on L LE. Attempted heel slides on L with pt unable to perform without pain x 2 reps; therefore discontinued. Heel slides on R x 20. SAQ performed bil x 20 with quad facilitation palpated on L but no active L LE leg movement. R bridge x 20. Rolling in bed performed to L side with pt using L hand rail with R hand. Pt able to use headboard with R hand to boost himself up in bed with MI. Pt fatigued during treatment but cooperative; extra time needed to be given to follow all commands.  All performed for LE strengthening as tolerated.   Pt left in bed supine resting. No c/o pain. Nursing stated that they do not set pt's bed alarm. Bed in lowest position.     Therapy Documentation Precautions:  Precautions Precautions: Fall Precaution Comments: Lt hemi, poor sitting/standing balance Restrictions Weight Bearing Restrictions: No    Therapy/Group: Individual Therapy  [name] 08/10/2022, 12:08 PM

## 2022-08-11 DIAGNOSIS — G8114 Spastic hemiplegia affecting left nondominant side: Secondary | ICD-10-CM

## 2022-08-11 DIAGNOSIS — A812 Progressive multifocal leukoencephalopathy: Secondary | ICD-10-CM | POA: Diagnosis not present

## 2022-08-11 LAB — BASIC METABOLIC PANEL
Anion gap: 9 (ref 5–15)
BUN: 19 mg/dL (ref 6–20)
CO2: 25 mmol/L (ref 22–32)
Calcium: 9.2 mg/dL (ref 8.9–10.3)
Chloride: 101 mmol/L (ref 98–111)
Creatinine, Ser: 1.39 mg/dL — ABNORMAL HIGH (ref 0.61–1.24)
GFR, Estimated: 58 mL/min — ABNORMAL LOW (ref >60)
Glucose, Bld: 107 mg/dL — ABNORMAL HIGH (ref 70–99)
Potassium: 4 mmol/L (ref 3.5–5.1)
Sodium: 135 mmol/L (ref 135–145)

## 2022-08-11 NOTE — Progress Notes (Signed)
Occupational Therapy Session Note  Patient Details  Name: Jose Lamb MRN: [id number] Date of Birth: 12/02/1960  Today's Date: 08/11/2022 OT Individual Time: 0851-1005 OT Individual Time Calculation (min): 74 min    Short Term Goals: Week 4:  OT Short Term Goal 1 (Week 4): LTG=STG d/t ELOS  Skilled Therapeutic Interventions/Progress Updates:   Pt received lying supine in bed.  Pt had no c/o pain this morning and requested to take a shower.  Supine<>EOB completed with Max A +2.  EOB<>tilt shower chair completed with Max A +2 and duress button was activated d/t shower chair slipping from position.  Pt safely transferred into shower chair with help from other staff members.  UB bathing completed with Mod A LB bathing with Max A.  Pt transfer from shower chair to wheelchair with Max A +3.  Pt able to complete self care with set-up at sink level.  UB/LB dressing with Max A.  Pt escorted to gym in TIS modified ashworth scale for wrist and elbow rated 2.  Mobility for L arm assessed and activation present with occasional 10 degree flexion but Pt was unable to produce today.  Pt is at times able to adduct [name] across midline for ADLs 20-30 degrees. Pt assisted back to room.  Chair alarm activated, call light within reach and all needs met.  Therapy Documentation Precautions:  Precautions Precautions: Fall Precaution Comments: Lt hemi, poor sitting/standing balance Restrictions Weight Bearing Restrictions: No     Therapy/Group: Individual Therapy  [name] 08/11/2022, 11:30 AM

## 2022-08-11 NOTE — Progress Notes (Signed)
Speech Language Pathology Daily Session Note  Patient Details  Name: Jose Lamb MRN: [id number] Date of Birth: [birth year]  Today's Date: 08/11/2022 SLP Individual Time: 1300-1330 SLP Individual Time Calculation (min): 30 min  Short Term Goals: Week 4: SLP Short Term Goal 1 (Week 4): STGs=LTGs due to ELOS  Skilled Therapeutic Interventions: Skilled treatment session focused on communication goals. Upon arrival, patient was upright while in the TIS wheelchair. Patient was sliding out of the chair and subtly moaning out in pain. Patient requested to return to bed. Patient transferred back to bed with +2 assist via the [facility] with a heavy left lean. Once in supine, patient reported he was not in pain but just uncomfortable due to poor pressure relief.  SLP facilitated session by educating the patient's wife on diagrammatic breathing and cues  to utilize while in supine or in chair to maximize volume of speech and overall intelligibility. Patient's wife verbalized understanding. Patient left upright in bed with alarm on and all needs within reach. Continue with current plan of care.      Pain No/Denies Pain   Therapy/Group: Individual Therapy  Vayda Dungee 08/11/2022, 3:09 PM

## 2022-08-11 NOTE — Progress Notes (Signed)
Physical Therapy Session Note  Patient Details  Name: Jose Lamb MRN: [id number] Date of Birth: Dec 12, 1960  Today's Date: 08/11/2022 PT Individual Time: 1430-1530 PT Individual Time Calculation (min): 60 min   Short Term Goals: Week 3:  PT Short Term Goal 1 (Week 3): STGs = LTGs   Skilled Therapeutic Interventions/Progress Updates:  Patient greeted supine in bed with spouse present and agreeable to PT treatment session. Spouse and other family present for hands-on family training session in order to ensure a safe discharge home. Spouse assisted with urinal management at beginning of treatment session as patient reported he needed to void- She then assisted him with doffing soiled shirt and donning a new shirt with therapist providing VC and demonstration for improved body mechanics throughout. Patient's spouse reported feeling comfortable with hoyer transfers and did not want to practice them anymore at this time since they spent a lot of time practicing in prior session with OT. She was agreeable to practicing sitting the patient EOB at home in order to work on sitting balance. Patient transitioned from semi-reclined to sitting EOB with TotalA (spouse educated to use two or more people to assist the patient with transitioning to the EOB). While sitting EOB, patient notably pushing with RUE and required multimodal cues for no RUE support and midline posturing. Spouse switched spots with therapist and sat on the L side of the patient providing appropriate VC/TC and assistance for sitting balance. Therapist spent time providing education regarding multimodal cues for improved Lt attention, visual scanning, engagement, midline posturing, righting reactions, etc. Patient remained sitting EOB ~10-15 minutes with CGA/MaxA throughout. Patient reporting need to void so patient transitioned back to supine with TotalA x2 for trunk and BLE management. While supine patient was scooted toward [facility] with TotalA x2.  Therapist assisted with repositioning head and [name] on pillows and patient was able to bridge slightly in order for pants to be doffed. Spouse was able to manage urinal with continent void, however minimal- RN notified and aware. Therapist answered all questions throughout treatment session and provided education regarding a safe discharge home. Patient left supine in bed with call bell within reach, RN and spouse present and all needs met.    Therapy Documentation Precautions:  Precautions Precautions: Fall Precaution Comments: Lt hemi, poor sitting/standing balance Restrictions Weight Bearing Restrictions: No  Pain: RLE due to increased tone, however improved with repositioning. No number provided.     Therapy/Group: Individual Therapy  Jose Lamb 08/11/2022, 7:56 AM

## 2022-08-11 NOTE — Progress Notes (Signed)
Occupational Therapy Session Note  Patient Details  Name: Jose Lamb MRN: [id number] Date of Birth: [birth year]  Today's Date: 08/11/2022 OT Individual Time: 7829-5621 OT Individual Time Calculation (min): 47 min    Short Term Goals: Week 4:  OT Short Term Goal 1 (Week 4): LTG=STG d/t ELOS  Skilled Therapeutic Interventions/Progress Updates:    Family education session completed with pt, his wife, and her sister. Focused initially on [name] PROM at bed level. Teach back performed and handout provided. Then provided education/demonstration on placement of bedpan, in conjunction with bed mobility techniques. Provided cueing for safety awareness and caregiver body mechanics. Edu provided on TIS management and safety during hoyer transfers. Pt left supine with all needs met, family present.    Therapy Documentation Precautions:  Precautions Precautions: Fall Precaution Comments: Lt hemi, poor sitting/standing balance Restrictions Weight Bearing Restrictions: No   Therapy/Group: Individual Therapy  Crissie Reese 08/11/2022, 3:37 PM

## 2022-08-11 NOTE — Progress Notes (Addendum)
PROGRESS NOTE   Subjective/Complaints:  No events overnight.  Cr remains elevated. KUB yesterday with ?small bowel ileus.  Discussed results with patient, Refusing miralax, LBM 7/3.  Please take MiraLAX today, along with PRNs, continue IV fluids.  He had no gross emesis, and is passing flatus.  Discussed the need to discontinue baclofen and oxycodone due to tendency to slow down the gut, patient is in agreement. Initially reports that left hip pain is much improved today, however later wife endorses that he had to get back in the bed because it was too severe.  After in-depth discussion with husband and wife regarding risks and benefits, we will proceed with Botox injection this afternoon.   ROS:  + L hip pain-ongoing, intermittent + Constipation-intermittent, ongoing + Nausea -recurrent Denies fevers, chills, N/V, abdominal pain, diarrhea, SOB, cough, chest pain, new weakness or paraesthesias.    Except for HPI Objective:   DG HIP UNILAT WITH PELVIS 2-3 VIEWS LEFT  Result Date: 08/10/2022 CLINICAL DATA:  Constipation, left groin pain EXAM: DG HIP (WITH OR WITHOUT PELVIS) 2-3V LEFT COMPARISON:  None Available. FINDINGS: There is no evidence of hip fracture or dislocation. There is no evidence of arthropathy or other focal bone abnormality. IMPRESSION: Negative. Electronically Signed   By: Elige Ko M.D.   On: 08/10/2022 13:58   DG Abd 1 View  Result Date: 08/10/2022 CLINICAL DATA:  Constipation and left groin pain EXAM: ABDOMEN - 1 VIEW COMPARISON:  Abdominal radiographs August 01, 2022 FINDINGS: Motion artifact slightly limits evaluation. There is a dilated loop of small bowel in the midabdomen. Nondilated loops of gas and stool-filled colon are present. No supine evidence of free air. Phleboliths overlie the pelvis. No acute osseous abnormality. IMPRESSION: Dilated loop of small bowel in the midabdomen, which is nonspecific but could  represent a developing ileus. Electronically Signed   By: Jacob Moores M.D.   On: 08/10/2022 13:15   Recent Labs    08/10/22 0546  WBC 4.3  HGB 13.7  HCT 41.4  PLT 146*      Recent Labs    08/10/22 0546 08/11/22 0617  NA 137 135  K 4.2 4.0  CL 102 101  CO2 25 25  GLUCOSE 122* 107*  BUN 18 19  CREATININE 1.42* 1.39*  CALCIUM 9.5 9.2      Intake/Output Summary (Last 24 hours) at 08/11/2022 1610 Last data filed at 08/11/2022 0809 Gross per 24 hour  Intake 1840.68 ml  Output 435 ml  Net 1405.68 ml          Physical Exam: Vital Signs Blood pressure 133/69, pulse 60, temperature 98.4 F (36.9 C), temperature source Oral, resp. rate 18, height 5\' 11"  (1.803 m), weight 89 kg, SpO2 97 %.     General: awake, alert, appropriate.  Sitting up upright in wheelchair, left posterior lean. HENT: conjugate gaze; oropharynx moist CV: regular rate and irregular rhythm; no JVD Pulmonary: CTA B/L; no W/R/R- good air movement GI: soft, NT, ND, hypoactive Psychiatric: Flat affect, appropriate mood Neurological: alert-mildly lethargic but stable Skin: C/D/I. No apparent lesions.    + Stage 2 to sacrum   MSK:  No apparent deformity.      Strength: Moving bilateral upper extremities antigravity against resistance; right lower extremity within normal limits; left lower extremity limited by pain and hemineglect  LLE: MAS 2-3 hip flexors, MAS 2 adductors, MAS 1 knee extensors;  MAS 1 elbow flexor, MAS 1 shoulder adductor   Neurologic exam:  Cognition: AAO x 3,   + left hemineglect  - can attend with minimal effort;  + Delayed recall - ongoing + Delayed motor initiation - ongoing + Moderate cognitive deficit - ongoing -somewhat more brisk with memory, recall, and communication today  Language: Fluent, No substitutions or neoglisms. Mild dysarthria.  Insight: Good insight into current condition; does wax and wane somewhat Mood: Flat affect.  Sensation:  decreased in  left upper and lower extremity CN:+ L sensory loss L V1-3,  Mild facial droop apparent when smiling. MSK: Positive left groin pain on FABER and FADIR; extremely limited hip internal rotation due to spasticity and guarding    Assessment/Plan: 1. Functional deficits which require 3+ hours per day of interdisciplinary therapy in a comprehensive inpatient rehab setting. Physiatrist is providing close team supervision and 24 hour management of active medical problems listed below. Physiatrist and rehab team continue to assess barriers to discharge/monitor patient progress toward functional and medical goals  Care Tool:  Bathing    Body parts bathed by patient: Left arm, Chest, Abdomen, Front perineal area, Face, Right upper leg, Left upper leg, Right lower leg, Left lower leg   Body parts bathed by helper: Buttocks, Right arm     Bathing assist Assist Level: Maximal Assistance - Patient 24 - 49%     Upper Body Dressing/Undressing Upper body dressing   What is the patient wearing?: Pull over shirt    Upper body assist Assist Level: Maximal Assistance - Patient 25 - 49%    Lower Body Dressing/Undressing Lower body dressing      What is the patient wearing?: Pants     Lower body assist Assist for lower body dressing: 2 Helpers     Toileting Toileting    Toileting assist Assist for toileting: Total Assistance - Patient < 25%     Transfers Chair/bed transfer  Transfers assist  Chair/bed transfer activity did not occur: Safety/medical concerns  Chair/bed transfer assist level: Maximal Assistance - Patient 25 - 49%     Locomotion Ambulation   Ambulation assist      Assist level: 2 helpers Assistive device: Walker-rolling Max distance: 12'   Walk 10 feet activity   Assist     Assist level: 2 helpers Assistive device: Walker-rolling   Walk 50 feet activity   Assist Walk 50 feet with 2 turns activity did not occur: Safety/medical concerns          Walk 150 feet activity   Assist Walk 150 feet activity did not occur: Safety/medical concerns         Walk 10 feet on uneven surface  activity   Assist Walk 10 feet on uneven surfaces activity did not occur: Safety/medical concerns         Wheelchair     Assist Is the patient using a wheelchair?: Yes Type of Wheelchair: Manual    Wheelchair assist level: Minimal Assistance - Patient > 75% Max wheelchair distance: 20'    Wheelchair 50 feet with 2 turns activity    Assist        Assist Level: Minimal Assistance - Patient > 75%   Wheelchair 150 feet activity  Assist      Assist Level: Maximal Assistance - Patient 25 - 49%   Blood pressure 133/69, pulse 60, temperature 98.4 F (36.9 C), temperature source Oral, resp. rate 18, height 5\' 11"  (1.803 m), weight 89 kg, SpO2 97 %.  Medical Problem List and Plan: 1. 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 Pt with subsequent left greater than right weakness and sensory loss particularly involving the lower extremities.              -patient may shower             -ELOS/Goals: 12-16 days  - Goals Max A/Total A, caregiver goals now - 08/14/22  DC date             - Continue CIR PT/OT/SLP   - Per family request, HIV/AIDs diagnosis to remain confidential between wife and patient; do not inform other family  - 6/18: Per wife needs intermittent SPV goals, not realistic given therapies. He does well with cueing. Max A with sitting balance, transfers, and standing. Requires a lot skilled, verbal, and tactile cues for transferring due to no carryover during and between sessions. LLE shooting out. Burden of care remains high; will work more on STS and transfers than higher-level dynamics. Per SLP on full diet working on speech intelligibility, will do memory eval.  - 6/21: Updated wife on functional prognosis with PML,  enforced need for ongoing support at home and anticipate some ongoing functional decline.  Must stay compliant with antiretroviral therapy.  Will discuss with team possibility of powered mobility, given her own functional deficits.   - 6/24: Worsening overall deficits in spasticity, vision, and nausea/vomitting over the weekend; expect progression of PML. MRI w/ and w/o brain pending; Neurology consulted for recommentations  - 6/25: Further cognitive and physical decline, poor safety awareness, D3 diet, spasticity seems somewhat better today.  Awaiting a.m. labs, suspect worsening hyponatremia, start IV fluids 100 cc/h.  - 6/26: MUCH improved cognition/hemineglect with medication adjustments and sodium correction   - 6/27: Per teams, will be heavy assist at home, unsure if family will be able to accommodate safely.  Patient and family extremely motivated for discharge home.  Had discussion with wife, patient; both agreeable to palliative care consult for goals of care and dispo planning discussion.   - 6/28: Palliative consult  - prognosis 6-12 months; rec'd home with palliative nursing and option to transition to hospice - Therapies and medical team with substantial concerns regarding wife's ability to provide care in the home setting, will extend length of stay through Monday next week to provide as many opportunities for hand on caregiver training as possible.  Team overall recommending SNF given substantial burden of care, however family extremely motivated for 1 to 2-week trial of discharge home.   - [employer] [facility] eval 7/3  2.  Antithrombotics: -DVT/anticoagulation:  Pharmaceutical: Eliquis             -antiplatelet therapy: none   3. 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  Scheduled Tylenol 1000 mg 3 times daily, add tramadol 50 mg twice daily as needed.   - 6/28: D/t  medication issues with tramadol and ongoing pain, added oxycodone 5 mg BID PRn   6/29- pt reports meds helping L hip pain  4. Mood/Behavior/Sleep: LCSW to evaluate  and provide emotional support             -antipsychotic agents: n/a   5. Neuropsych/cognition: This patient is capable of making decisions on his own behalf.   - SLP eval   - Neuropsych eval 6/18: While the patient clearly had some cognitive issues most of these were noted having to do with patterns consistent with midbrain and subcortical dysfunction and dysregulation. Patient denied significant depression or anxiety and denied significant coping difficulties with his current hospitalization although he is anxious about how much she will be able to improve even with therapies.    - 6/21: Per wife, increasing lethargy, depression, and mood lability at home prior to admission.  Possibly signs of depression secondary to cognitive injury and functional decline.  Initiate activating SSRI Lexapro 10 mg daily.  - 6/25: Discontinue Lexapro, tramadol due to potential serotonergic drug interaction and SIADH contributing to emesis, confusion.   - 6/26: Per wife, patient's daughter passed suddenly this AM; team aware, will provide support as needed, neuropsych informed - appreciate recommendations  - 6/28: neuropsych saw patient, appreciate Dr. Marvetta Gibbons assistance in this patient's care  - 7/1: Resume Lexapro 10 mg, retimed for nightly.  Will monitor sodium.  -7-4: Increasing nausea, lethargy; treatment as below, may need to DC Lexapro if ongoing   6. Skin/Wound Care: Routine skin care checks  - Sacral stage 2 - offloading with foam and bed mobility  - Penile wounds - keep dry and monitor   7. Fluids/Electrolytes/Nutrition: Routine Is and Os and follow-up chemistries             -carb modified diet   - 6/14: Eating 25-100% meals. SLP dysphagia screen d/t mild dysarthria today.  -6/15  30- 100% intake, continue to monitor   -6/16 Ensure  max daily ordered   - 6/24: Na 130; fluid restriction -> 132 w/ IVF -> 133-> 135 6/27   - 6/27: Discontinue IV fluid, transition to salt tabs 1 g twice daily.  Recheck BMP 6/29  - 6/29- up to 135- con't to monitor  - 7/1 - Na 137; uptrending, reduce to 1 g daily and monitor Na in 2-3 days  - 7/4: Na stable 136; AKI Cr 0.8 -> 1.4; start 75 cc/hr IVF and recheck in AM. Encourage PO fluids (drinking well under restriction).  7-5: NA stable 135, however creatinine remains elevated 1.39.  Increase IV fluids to 100 cc/h, Dc salt tabs, get urine studies UA and sodium.  Discussed with ID possible contributor of Bactrim, appreciate Dr. Zenaida Niece Dam's insight; will hold Bactrim tomorrow a.m. and trend creatinine over the weekend along with G6PD reading.   - If no other causative agents identified, creatinine improves Sunday/Monday, and patient is not G6PD deficient, may switch to dapsone prior to discharge.   - If creatinine improves 7/6 a.m, given Bactrim was not held 7/5, will resume daily Bactrim and assume this was not contributor.   - Of note, both Biktarvy and Bactrim can cause false elevations of creatinine.   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 fib: continue Eliquis for PE  -HR controlled   15: DM-2; A1c = 6.6% on 04/10/2022; (no home meds listed)             -serum glucose range: 89-119             -continue carb modified diet  - No CBGs, monitor BID AC for now  - 6/16-17 very well-controlled; can consider DC if remain low with adequate PO   - 6/18: well controlled, POs  25-75%, DC CBG 6/19   - 6/26: Ketones in UA yesterday; BMP glucose remain <200; POs poor - working on POs today  -6/27: Feels much improved, BMP glucose normal, no further monitoring   16: Acute PE and right lower extremity DVT             -continue Eliquis -> 10 to 5 mg BID dosing 6/17    17: Obesity: BMI 32    18: Chest pain 6/11>>negative trop x 2; EKG non-acute             -attributed to PE versus musculoskeletal   - 6/14: Improved with lidoderm patch. Encourage incentive spirometry.  -6/16 reports this is improved today.   19. Constipation. Schedule Sennakot-S 1 tab QHS and add PRN Miralax daily 17 g.   -6/15 reports improved with BM yesterday   -6/16 denies feeling constipated - add Miralax 17g BID    - LBM 6/23, large after at bedtime suppository - patient reports LBM 6/26; monitor  6/30- per nursing, did chart review, LBM was actually 6/23- will give Suppository today to get him to have BM   - 6/30 large BM with suppository; increase sennakots S to 2 tabs BID   - 7-3: Last BM, large, liquid  20. Spasticity/L hip pain. Extensor tone in [name] and LLE per therapies. - 6/18:  added Baclofen 5 mg TID. Consider PRAFO.  - 6/19: Benefit with ongoing mild spasticity, increased to 10 mg twice daily - Continue 10 mg TID - 6/25: Increased baclofen to 15 mg TID  - 6/27: Tone improving, will decrease to 10 mg 3 times daily to reduce confounding confusion - 6/28: Mildly increased tone, however comparable with prior days; monitor - 7/1: Tone seems improved everywhere except for left hip; encouraged use of oxycodone, reduce baclofen to 5 mg 3 times daily due to cognition and constipation -encouraged use of oxycodone as needed 7-2 7-3: Continue oxycodone as needed, spasticity medications, stretching and heat 7/4: Switch from baclofen to tizanidine 2 mg TID d/t AKI, ongoing constipation, nausea. Will get L hip x-ray, pending results may try Botox versus injection into right hip in AM. 7/5: Xray  without arthritis. Hip flexor, adductor spasticity as above. Discussed with patient and wife risks and benefits of botulinum injection; both agree with this as plan. Performed as below: ----------------------------------------------------------------------------------- Botolulinum Toxin Injection: Xeomin  Goals with treatment: [ x] Decrease spasms/ abnormal movements [ x] Improve Active / Passive ROM [ ]  Improve ADLs [x ] Improve functional mobility [ x] Improve gait mechanics [ ]  Improve positioning/posture [ x] Prevent contracture  [ ]  Prevent joint destruction [ ]  Prevent skin breakdown [x ] Decrease caregiver burden [ ]  Improve hygiene [x ] Improve Pain  MEDICATION:  Xeomin - 100 U NaCl - 1 mL   CONSENT: Obtained in writing followed by time-out per  policy. Consent uploaded to chart.  Benefits discussed  included, but were not limited to, decreased muscle tightness and spasticity, increased joint range of motion, improved limb positioning and facilitation of hygiene and nursing care.   Risks discussed included, but were not limited to, pain and discomfort, bleeding, bruising, excessive weakness, venous thrombosis, muscle atrophy, and distant spread of toxin which could include generalized muscle weakness, diplopia, blurred vision, ptosis, dysphagia, dysphonia, dysarthria, urinary incontinence and breathing difficulties. These symptoms have been reported hours to weeks after injection. Swallowing and breathing difficulties may be life threatening, and there have been reports of death. Patient/Family member/Guardian/Caregiver have been offered botulinum toxin informational material upon initial consultation and this information has been continually available. All questions answered to patient/family member/guardian/ caregiver satisfaction. They would like to proceed with procedure. There are no noted contraindications to procedure.  PROCEDURE [x]  Without Ultrasound: Patient was placed  in a position with the appropriate muscles exposed, located and identified. The skin was cleaned with alcohol. Using combination EMG amplification and electrical stimulation, the following muscles were identified using anatomical landmarks described by Perotto, et al (1994) and injected following aspiration to ensure blood vessels were avoided. In addition, femoral artery pulse was palpated medial top iliopsoas injection site prior to needle insertion.   [_ ] With Ultrasound: Patient was placed in a position with the appropriate muscles exposed, located and identified. The skin was cleaned with ChloraPrep and ethyl chloride was sprayed for topical anesthetic. Using combination Ultrasound guidance for anatomical guidance, avoidance of significant vasculature, to decrease the risk of hematoma formation and ensure botox was placed in the correct location; EMG amplification and electrical stimulation, the following muscles were identified and injected following aspiration to ensure blood vessels were avoided. A _linear transducer was used during the procedure. The botulinum toxin was visualized entering the appropriate musculature.   MUSCLE: Units Bryna Colander Adductor Magnus - 50U, in 2 sites Iliopsoas - 50U   100 units were injected without difficulty. No complications were encountered. The patient tolerated the procedure well. Wasted 0  -----------------------------------------------------------------------------------------------------  21: Penile wounds, suspected HSV.  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  Completed treatment with valacyclovir 1000 mg twice daily for 14 days. Discussed with pharmacy that treatmenrt has been completed  22. Emesis/Nausea/constipation: scopolamine patch ordered, improved, will d/c -> resume 6/24; she endorses improvement but continues to require as needed Zofran, worsening emesis.  Resolved -KUB without obstruction, severe constipation - scopalamine patch, Zofran PRN - No emesis recorded  since 6/25 AM; did get IV yesterday afternoon - IVF as above; minimal POs last 2 days - 6/26: No PRNS today, continue Scopalamine patch and DC in AM if tolerating POs -6/27: No further as needed Zofran, DC scopolamine patch.  Tolerating p.o.'s better  6/29-6/30 pt denied nausea-eating breakfast heartily this AM  7/4: Appears daily nausea resumed about 2 days ago, no association with food intakes or medications.  May be related to spasticity in left leg.  Last BM 7-2, large, liquid; has had intermittent constipation and generally needing suppository for bowel movements.  Check KUB today, as needed sorbitol  7/5: KUB with dilated small bowel loop, ?developming illeus; tolerating POs, passing gas; IFV, encourage bowel regimen, and DC oxy and baclofen  23. Right ear decreased hearing: debrox ear drops ordered, improved, will d/c  6/27; resume Debrox stops for 3 days -resolved  24.  Encephalopathy/altered mental status -overall improved, continues some waxing and waning   -Neurology consulted, MRI with and without yesterday without any worsening of PML, low suspicion for IRIS at this time but  will monitor closely.  Appreciate their recommendations.  -Has remained afebrile, normotensive, without gross signs of infection; however immunocompromise due to AIDs.  Will get a urine with reflex to culture, chest x-ray, KUB today.  -DC tramadol Lexapro due to possible serotonergic effects + SIADH potential causing altered mental status, worsening tone, and emesis.  -Labs yesterday significant for hyponatremia 130; placed on fluid restriction, pending labs today, start IV fluids 100 cc/h  -If no apparent identifiable cause, will consult ID for further recommendations   - 6/26: Much improved with sodium increase as above; continue current Tx, BMP in AM, transition to PO fluids and salt tabs in AM  6/29- Na 135 this AM  25.  Urinary incontinence, intermittent. May be worsened due to IV fluids   -  UA negative,  PVRs low, monitor  6/29- rechecked due to hematuria- had some large Hb in U/A however is negative otherwise.   7-1: See above, hematuria started with catheterization for urinalysis, likely traumatic cath and ongoing slow bleed due to Eliquis.  Hemoglobin has remained stable, can follow-up further as outpatient.  7-2: Per review, no gross hematuria, remains somewhat dark-colored but improving.    LOS: 23 days A FACE TO FACE EVALUATION WAS PERFORMED  Jose Lamb 08/11/2022, 9:07 AM

## 2022-08-11 NOTE — Progress Notes (Signed)
Recreational Therapy Discharge Summary Patient Details  Name: Ajay Chiappetta MRN: [id number] Date of Birth: 07-Nov-1960 Today's Date: 08/11/2022  Comments on progress toward goals: Pt is scheduled for discharge home with family 7/8.  TR sessions have been focused on social, emotional health and activity tolerance.  Pt participated in leisure education on evaluation, discussed stress management and participated in dance group x2 during LOS.  Pt is anxious to return home with family.  TR specific goal met. Reasons for discharge: discharge from hospital  Follow-up: Home Health  Patient/family agrees with progress made and goals achieved: Yes  Shalonda Sachse 08/11/2022, 12:46 PM

## 2022-08-11 NOTE — Progress Notes (Signed)
Recreational Therapy Session Note  Patient Details  Name: Jose Lamb MRN: [id number] Date of Birth: 27-Feb-1960 Today's Date: 08/11/2022  Pain: no c/o Skilled Therapeutic Interventions/Progress Updates:  Session focused on discharge planning & identifying energy conservation strategies to use at home.  Pt stated feeling excited to return home but is somewhat concerned with potential visitors once he gets there.  Discussed importance of directing his care, giving guidelines/parameters to others to assist in continued recovery.  Pt stated understanding.  Therapy/Group: Individual Therapy   Howie Rufus 08/11/2022, 12:47 PM

## 2022-08-11 NOTE — Progress Notes (Signed)
Palliative Medicine Progress Note   Patient Name: Jose Lamb       Date: 08/11/2022 DOB: [birth year]  Age: 62 y.o. MRN#: [id number] Attending Physician: Angelina Sheriff, DO Primary Care Physician: Ladon Applebaum Admit Date: 07/19/2022   HPI/Patient Profile: 62 y.o. male  with past medical history of prior CVA, paroxysmal atrial fibrillation not on oral anticoagulation, asthma, HIV with noncompliance, and history of pulmonary embolism who presented to [facility] ED admitted on 07/07/2022 with complaints of left-sided weakness x 6 weeks.  Associated with frequent falls.  Based on MRI and LP results, patient was thought likely to have PML in the setting of noncompliance with HIV medication.  He was transferred to CIR on 07/14/2022, but subsequently developed some chest discomfort.  He was found to have a large PE on CTA and was transferred back to [facility] on 07/15/2022.  He was stabilized and transferred back to CIR on 07/19/2022.   Palliative Medicine was consulted for goals of care in the setting of "overall function remains max assist with no foreseeable improvement".   Subjective: Chart reviewed. Note KUB with possible small bowel ileus.   Bedside visit with patient and wife. Patient is sleeping through most of my visit. When he does awaken, I encouraged him to take the [facility] and other medications as needed.   Wife reports tentative plan is to discharge Monday 7/8. She has been receiving education today from the rehab team on how to care for patient at home. She reports feeling hopeful she will be able to adequately care for him.    Objective:  Physical Exam Vitals reviewed.  Constitutional:      General: He is sleeping. He is not in acute distress.    Appearance: He is  ill-appearing.  Pulmonary:     Effort: Pulmonary effort is normal.  Neurological:     Mental Status: He is lethargic.     Motor: Weakness present.             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 Use medication by any route, positioning, wound care, and other measures to relieve pain and suffering. Use oxygen, suction and manual treatment of airway obstruction as needed for comfort. Do not transfer to the hospital unless comfort needs cannot be met in current location.  Antibiotics: Determine use of limitation of antibiotics when infection occurs  IV Fluids: IV fluids for a defined trial period  Feeding Tube: No feeding tube    Code Status: DNR   Prognosis:  6-12 months  Discharge Planning: Home with Palliative Services   Thank you for allowing the Palliative Medicine Team to assist in the care of this patient.   MDM - moderate   Merry Proud, NP   Please contact Palliative Medicine Team phone at [phone] for questions and concerns.  For individual providers, please see AMION.

## 2022-08-11 NOTE — Progress Notes (Addendum)
Patient ID: Jose Lamb, male   DOB: [birth year], 62 y.o.   MRN: [id number]  SW confirmed transportation in place with [facility] for Monday at 10am.   DME- hoyer lift and hospital bed to be delivered tomorrow.  SW left message for [facility] [phone]) to discuss submitting referral, and SW waiting on follow-up.    Cecile Sheerer, MSW, LCSWA Office: [phone] Cell: [phone] Fax: [phone]

## 2022-08-11 NOTE — Progress Notes (Shared)
Occupational Therapy Discharge Summary  Patient Details  Name: Jose Lamb MRN: [id number] Date of Birth: Jul 11, 1960  Date of Discharge from OT service:August 12, 2022  Patient has met 7 out of 7 long term goals due to ability to compensate for deficits, functional use of  LEFT upper and LEFT lower extremity, improved attention, improved awareness, and wifes ability to be a caregiver 24/7 .  Patient to discharge at [facility] Max Assist level.  D/t the progressive of PML pt has had a functional decline that is not expected to improve significantly.  Pt will be a very high burden of care at home.  Wife is committed to provided this level of care. Wife has completed several sessions of family education inlcuding ADL care at bed level, bed mobility, TIS education, positioning and PROM exercises for [name].    Recommendation:  Patient will benefit from ongoing skilled OT services in home health setting to continue to advance functional skills in the area of BADL.  Equipment: Hospital bed, Hoyer lift, TIS wheelchair  Reasons for discharge: D/c from hospital  Patient/family agrees with progress made and goals achieved: Yes  OT Discharge Precautions/Restrictions  Precautions Precautions: Fall Precaution Comments: Lt hemi, poor sitting/standing balance Restrictions Weight Bearing Restrictions: No General   Vital Signs   Pain Pain Assessment Pain Scale: 0-10 Pain Score: 0-No pain ADL ADL Eating: Supervision/safety Where Assessed-Eating: Bed level Grooming: Setup Where Assessed-Grooming: Sitting at sink Upper Body Bathing: Moderate assistance Where Assessed-Upper Body Bathing: Bed level Lower Body Bathing: Maximal assistance Where Assessed-Lower Body Bathing: Bed level Upper Body Dressing: Maximal assistance Where Assessed-Upper Body Dressing: Bed level Lower Body Dressing: Maximal assistance Where Assessed-Lower Body Dressing: Bed level Toileting: Maximal assistance Where  Assessed-Toileting: Bed level Toilet Transfer: Dependent Toilet Transfer Method: Stand pivot Acupuncturist: Bedside commode, Grab bars Tub/Shower Transfer: Dependent Tub/Shower Equipment: Other (comment) (Tilt shower chair with lateral support) Film/video editor: Dependent Film/video editor Method: Other (comment) (Tilt shower chair with lateral support) Astronomer: Grab bars, Other (comment) (Tilt shower chair with lateral support) ADL Comments: Reccomendation for d/c home is bed level ADLs pt requires Max assist for all UB/LB B/D tasks Vision Baseline Vision/History: 1 Wears glasses Patient Visual Report: Central vision impairment;Other (comment) (Pt has R gaze Max cueing, can get a couple of degrees to midline.  Gaze never fully makes it to the L) Vision Assessment?: Yes Eye Alignment: Impaired (comment) Ocular Range of Motion: Restricted on the right Alignment/Gaze Preference: Gaze right (Max cuing needed for R gaze.  Able to get a few degress but never fully able to look to the L) Tracking/Visual Pursuits: Other (comment);Right eye does not track medially;Left eye does not track laterally (Does not retrack past mildine) Saccades: Other (comment) (Can't moves eyes past midline to L side) Convergence: Within functional limits Visual Fields: Other (comment);Left visual field deficit (Likely intact but difficult to assess d/t cogntion) Perception  Perception: Impaired Inattention/Neglect: Does not attend to left side of body;Does not attend to left visual field Spatial Orientation: impaired Praxis Praxis: Impaired Praxis Impairment Details: Motor planning;Ideomotor;Initiation Cognition Cognition Overall Cognitive Status: Within Functional Limits for tasks assessed Arousal/Alertness: Awake/alert Orientation Level: Person;Place;Situation Person: Oriented Place: Oriented Situation: Oriented Memory: Appears intact Attention: Selective Selective  Attention: Impaired Selective Attention Impairment: Verbal basic;Functional basic Awareness: Impaired Awareness Impairment: Emergent impairment Problem Solving: Impaired Problem Solving Impairment: Functional complex Executive Function: Sequencing Sequencing: Impaired Sequencing Impairment: Verbal basic;Functional basic Safety/Judgment: Impaired Brief Interview for Mental Status (BIMS) Repetition  of Three Words (First Attempt): 3 Temporal Orientation: Year: Correct Temporal Orientation: Month: Accurate within 5 days Temporal Orientation: Day: Correct Recall: "Sock": Yes, no cue required Recall: "Blue": Yes, no cue required Recall: "Bed": Yes, no cue required BIMS Summary Score: 15 Sensation Sensation Light Touch: Impaired by gross assessment Hot/Cold: Appears Intact Proprioception: Impaired by gross assessment Additional Comments: Decreased light touch in L hemibody Coordination Gross Motor Movements are Fluid and Coordinated: No Fine Motor Movements are Fluid and Coordinated: No Coordination and Movement Description: impaired Motor  Motor Motor: Hemiplegia Motor - Skilled Clinical Observations: L sided hemiplegia Mobility  Bed Mobility Bed Mobility: Rolling Right;Rolling Left;Right Sidelying to Sit;Left Sidelying to Sit;Supine to Sit;Sitting - Scoot to [place] of Bed;Sit to Supine;Scooting to [facility] Rolling Right: Maximal Assistance - Patient 25-49% Rolling Left: Maximal Assistance - Patient 25-49% Right Sidelying to Sit: Maximal Assistance - Patient 25-49% Left Sidelying to Sit: Maximal Assistance - Patient 25-49% Supine to Sit: Maximal Assistance - Patient - Patient 25-49% Sitting - Scoot to Edge of Bed: Maximal Assistance - Patient 25-49% Sit to Supine: Maximal Assistance - Patient 25-49% Scooting to HOB: Minimal Assistance - Patient > 75% Transfers Sit to Stand: Maximal Assistance - Patient 25-49% Stand to Sit: Maximal Assistance - Patient 25-49%  Trunk/Postural  Assessment  Cervical Assessment Cervical Assessment: Exceptions to [facility] Thoracic Assessment Thoracic Assessment: Exceptions to [facility] Lumbar Assessment Lumbar Assessment: Exceptions to [facility] Postural Control Postural Control: Deficits on evaluation Head Control: Difficulty holding head up and requires max cues Trunk Control: No trunck control pt has L lateral lean, anterior lean and posterior lean.  Pt cannot sit independently Righting Reactions: impaired and delayed Protective Responses: impaired and delayed Postural Limitations: Has difficulty sitting unsupported, Lateral leans on both sides, unsafe to leave sitting without supervision  Balance Balance Balance Assessed: Yes Static Sitting Balance Static Sitting - Balance Support: Feet supported;Bilateral upper extremity supported Static Sitting - Level of Assistance: 1: +2 Total assist Dynamic Sitting Balance Dynamic Sitting - Balance Support: Feet supported;Bilateral upper extremity supported Dynamic Sitting - Level of Assistance: 1: +2 Total assist Static Standing Balance Static Standing - Balance Support: During functional activity;Bilateral upper extremity supported (Pt requires Max A + 2 while stadning) Dynamic Standing Balance Dynamic Standing - Balance Support: Bilateral upper extremity supported;During functional activity (Max A +2, Dynamic standing is unsafe at this time) Dynamic Standing - Level of Assistance: 1: +2 Total assist Dynamic Standing - Balance Activities: Lateral lean/weight shifting;Forward lean/weight shifting;Reaching for objects;Reaching across midline Extremity/Trunk Assessment RUE Assessment RUE Assessment: Within Functional Limits RUE Tone RUE Tone: Modified [place];Hypertonic [name] Assessment [name] Assessment: Exceptions to [facility] Active Range of Motion (AROM) Comments: Activation present, occasional 10 degree flexion [name] Strength [name] Overall Strength: Deficits [name] Tone [name] Tone: Modified [place]worth Body  Part - Modified [place]worth Scale: Wrist;Elbow Elbow - Modified [place]worth Scale for Grading Hypertonia [name]: More marked increase in muscle tone through most of the ROM, but affected part(s) easily moved Wrist - Modified [place]worth Scale for Grading Hypertonia [name]: More marked increase in muscle tone through most of the ROM, but affected part(s) easily moved Modified Ashworth Scale for Grading Hypertonia [name]: More marked increase in muscle tone through most of the ROM, but affected part(s) easily moved   [employer] 08/11/2022, 11:38 AM

## 2022-08-12 DIAGNOSIS — A812 Progressive multifocal leukoencephalopathy: Secondary | ICD-10-CM | POA: Diagnosis not present

## 2022-08-12 LAB — BASIC METABOLIC PANEL
Anion gap: 9 (ref 5–15)
BUN: 14 mg/dL (ref 6–20)
CO2: 26 mmol/L (ref 22–32)
Calcium: 9.2 mg/dL (ref 8.9–10.3)
Chloride: 102 mmol/L (ref 98–111)
Creatinine, Ser: 1.04 mg/dL (ref 0.61–1.24)
GFR, Estimated: >60 mL/min (ref >60)
Glucose, Bld: 112 mg/dL — ABNORMAL HIGH (ref 70–99)
Potassium: 4 mmol/L (ref 3.5–5.1)
Sodium: 137 mmol/L (ref 135–145)

## 2022-08-12 LAB — URINALYSIS, ROUTINE W REFLEX MICROSCOPIC
Bacteria, UA: NONE SEEN
Bilirubin Urine: NEGATIVE
Glucose, UA: NEGATIVE mg/dL
Ketones, ur: NEGATIVE mg/dL
Leukocytes,Ua: NEGATIVE
Nitrite: NEGATIVE
Protein, ur: 100 mg/dL — AB
RBC / HPF: >50 RBC/hpf (ref 0–5)
Specific Gravity, Urine: 1.026 (ref 1.005–1.030)
pH: 5 (ref 5.0–8.0)

## 2022-08-12 LAB — GLUCOSE 6 PHOSPHATE DEHYDROGENASE
G6PDH: 9.7 U/g{Hb} (ref 5.5–14.2)
Hemoglobin: 13 g/dL (ref 13.0–17.7)

## 2022-08-12 LAB — SODIUM, URINE, RANDOM: Sodium, Ur: 154 mmol/L

## 2022-08-12 MED ORDER — SULFAMETHOXAZOLE-TRIMETHOPRIM 800-160 MG PO TABS
1.0000 | ORAL_TABLET | Freq: Every day | ORAL | Status: DC
  Administered 2022-08-12 – 2022-08-14 (×3): 1 via ORAL
  Filled 2022-08-12 (×3): qty 1

## 2022-08-12 NOTE — Progress Notes (Signed)
Speech Language Pathology Daily Session Note  Patient Details  Name: Jose Lamb MRN: [id number] Date of Birth: January 31, 1961  Today's Date: 08/12/2022 SLP Individual Time: 1015-1100 SLP Individual Time Calculation (min): 45 min  Short Term Goals: Week 4: SLP Short Term Goal 1 (Week 4): STGs=LTGs due to ELOS  Skilled Therapeutic Interventions:   Pt seen for skilled speech therapy targeting speech goals. Pt lying in bed upon SLP arrival, denied pain. He reported that he had recently received his pain meds; pt noted to have much more difficulty focusing this session than previous ones and somnolence which progressed with the session. Pt's speech intelligibility at the start of session was approximately 40% with reduced vocal intensity, required mod verbal cues to increase volume and use strategies. Pt recalled diaphragmatic breathing strategies independently but required mod-max verbal cueing to use during structured therapeutic tasks producing 1-3 word sentences. Pt's intelligibility improved to 50-60% when provided mod-max verbal cues to use strategies and with use of 1-10 loudness visual aid to increase vocal intensity. Pt reported stomach discomfort and a need to use the bathroom. Session dc'd early as the pt was having difficulty attending to tasks and needed to use the bathroom. Pt left in bed with call bell in reach.  Pain Pain Assessment Pain Scale: 0-10 Pain Score: 0-No pain Faces Pain Scale: No hurt  Therapy/Group: Individual Therapy  Alphonsus Sias 08/12/2022, 11:10 AM

## 2022-08-12 NOTE — Progress Notes (Signed)
Occupational Therapy Session Note  Patient Details  Name: Philo Glessner MRN: [id number] Date of Birth: [birth year]  Today's Date: 08/12/2022 OT Individual Time: 4098-1191 OT Individual Time Calculation (min): 57 min    Short Term Goals: Week 4:  OT Short Term Goal 1 (Week 4): LTG=STG d/t ELOS  Skilled Therapeutic Interventions/Progress Updates:    Pt received supine with no c/o pain, agreeable to OT session.  He reports his hip feels better after botox injections. Pants donned supine with max A. He came to EOB with heavy max A. Pt now actively pushing with the RUE, max A for sitting balance. He stood in the stedy with mod A to power up and then heavy max A to maintain balance seated in the stedy. Mod A to stand from perched seat and transfer to w/c. Oral care with set up assist at the sink. Pt was taken via w/c to the therapy gym. He worked on Radiation protection practitioner to the left using a mirror placed on his far L side and targets to reach to. He demonstrated improved scanning overall today and required only min-mod cueing overall. Closed chain BUE chest press with a 2lb dowel, requiring mod facilitation to maintain L grasp and elbow extension.3x10 repetitions. He required mod-max cueing for visual attention to the [name] during activity. He returned to his room and completed stedy transfer back to bed with max +2 assist. He completed bed mobility rolling R and L with mod A for total A peri hygiene- small incontinent BM. He was left supine with all needs met.    Therapy Documentation Precautions:  Precautions Precautions: Fall Precaution Comments: Lt hemi, poor sitting/standing balance Restrictions Weight Bearing Restrictions: No  Therapy/Group: Individual Therapy  Crissie Reese 08/12/2022, 6:53 AM

## 2022-08-12 NOTE — Progress Notes (Signed)
PROGRESS NOTE   Subjective/Complaints:  No c/os this am    ROS:  + L hip pain-ongoing, intermittent + Constipation-intermittent, ongoing + Nausea -recurrent Denies fevers, chills, N/V, abdominal pain, diarrhea, SOB, cough, chest pain, new weakness or paraesthesias.    Except for HPI Objective:   DG HIP UNILAT WITH PELVIS 2-3 VIEWS LEFT  Result Date: 08/10/2022 CLINICAL DATA:  Constipation, left groin pain EXAM: DG HIP (WITH OR WITHOUT PELVIS) 2-3V LEFT COMPARISON:  None Available. FINDINGS: There is no evidence of hip fracture or dislocation. There is no evidence of arthropathy or other focal bone abnormality. IMPRESSION: Negative. Electronically Signed   By: Elige Ko M.D.   On: 08/10/2022 13:58   DG Abd 1 View  Result Date: 08/10/2022 CLINICAL DATA:  Constipation and left groin pain EXAM: ABDOMEN - 1 VIEW COMPARISON:  Abdominal radiographs August 01, 2022 FINDINGS: Motion artifact slightly limits evaluation. There is a dilated loop of small bowel in the midabdomen. Nondilated loops of gas and stool-filled colon are present. No supine evidence of free air. Phleboliths overlie the pelvis. No acute osseous abnormality. IMPRESSION: Dilated loop of small bowel in the midabdomen, which is nonspecific but could represent a developing ileus. Electronically Signed   By: Jacob Moores M.D.   On: 08/10/2022 13:15   Recent Labs    08/10/22 0546  WBC 4.3  HGB 13.7  HCT 41.4  PLT 146*      Recent Labs    08/11/22 0617 08/12/22 0538  NA 135 137  K 4.0 4.0  CL 101 102  CO2 25 26  GLUCOSE 107* 112*  BUN 19 14  CREATININE 1.39* 1.04  CALCIUM 9.2 9.2      Intake/Output Summary (Last 24 hours) at 08/12/2022 0827 Last data filed at 08/12/2022 0800 Gross per 24 hour  Intake 120 ml  Output 250 ml  Net -130 ml          Physical Exam: Vital Signs Blood pressure (!) 136/91, pulse 77, temperature 98.2 F (36.8 C),  temperature source Oral, resp. rate 18, height 5\' 11"  (1.803 m), weight 89 kg, SpO2 96 %.     General: awake, alert, appropriate.  Sitting up upright in wheelchair, left posterior lean. HENT: conjugate gaze; oropharynx moist CV: regular rate and irregular rhythm; no JVD Pulmonary: CTA B/L; no W/R/R- good air movement GI: soft, NT, ND, hypoactive Psychiatric: Flat affect, appropriate mood Neurological: alert-mildly lethargic but stable Skin: C/D/I. No apparent lesions.    + Stage 2 to sacrum   MSK:      No apparent deformity.      Strength: Moving bilateral upper extremities antigravity against resistance; right lower extremity within normal limits; left lower extremity limited by pain and hemineglect  LLE: MAS 2-3 hip flexors, MAS 2 adductors, MAS 1 knee extensors;  MAS 1 elbow flexor, MAS 1 shoulder adductor   Neurologic exam:  Cognition: AAO x 3,   + left hemineglect  - can attend with minimal effort;  + Delayed recall - ongoing + Delayed motor initiation - ongoing + Moderate cognitive deficit - ongoing -somewhat more brisk with memory, recall, and communication today  Language: Fluent, No substitutions or  neoglisms. Mild dysarthria.  Insight: Good insight into current condition; does wax and wane somewhat Mood: Flat affect.  Sensation:  decreased in left upper and lower extremity CN:+ L sensory loss L V1-3,  Mild facial droop apparent when smiling. MSK: Positive left groin pain on FABER and FADIR; extremely limited hip internal rotation due to spasticity and guarding    Assessment/Plan: 1. Functional deficits which require 3+ hours per day of interdisciplinary therapy in a comprehensive inpatient rehab setting. Physiatrist is providing close team supervision and 24 hour management of active medical problems listed below. Physiatrist and rehab team continue to assess barriers to discharge/monitor patient progress toward functional and medical goals  Care  Tool:  Bathing    Body parts bathed by patient: Right arm, Left arm, Chest, Abdomen, Front perineal area, Right upper leg, Left upper leg, Face   Body parts bathed by helper: Buttocks, Right lower leg, Left lower leg     Bathing assist Assist Level: Maximal Assistance - Patient 24 - 49%     Upper Body Dressing/Undressing Upper body dressing   What is the patient wearing?: Pull over shirt    Upper body assist Assist Level: Maximal Assistance - Patient 25 - 49%    Lower Body Dressing/Undressing Lower body dressing      What is the patient wearing?: Pants     Lower body assist Assist for lower body dressing: Maximal Assistance - Patient 25 - 49%     Toileting Toileting    Toileting assist Assist for toileting: Total Assistance - Patient < 25%     Transfers Chair/bed transfer  Transfers assist  Chair/bed transfer activity did not occur: Safety/medical concerns  Chair/bed transfer assist level: Maximal Assistance - Patient 25 - 49%     Locomotion Ambulation   Ambulation assist      Assist level: 2 helpers Assistive device: Walker-rolling Max distance: 12'   Walk 10 feet activity   Assist     Assist level: 2 helpers Assistive device: Walker-rolling   Walk 50 feet activity   Assist Walk 50 feet with 2 turns activity did not occur: Safety/medical concerns         Walk 150 feet activity   Assist Walk 150 feet activity did not occur: Safety/medical concerns         Walk 10 feet on uneven surface  activity   Assist Walk 10 feet on uneven surfaces activity did not occur: Safety/medical concerns         Wheelchair     Assist Is the patient using a wheelchair?: Yes Type of Wheelchair: Manual    Wheelchair assist level: Minimal Assistance - Patient > 75% Max wheelchair distance: 16'    Wheelchair 50 feet with 2 turns activity    Assist        Assist Level: Minimal Assistance - Patient > 75%   Wheelchair 150 feet  activity     Assist      Assist Level: Maximal Assistance - Patient 25 - 49%   Blood pressure (!) 136/91, pulse 77, temperature 98.2 F (36.8 C), temperature source Oral, resp. rate 18, height 5\' 11"  (1.803 m), weight 89 kg, SpO2 96 %.  Medical Problem List and Plan: 1. 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 Pt with subsequent left greater than right weakness and sensory loss particularly involving the lower extremities.              -patient  may shower             -ELOS/Goals:  Goals Max A/Total A, caregiver goals now - 08/14/22  DC date             - Continue CIR PT/OT/SLP   - Per family request, HIV/AIDs diagnosis to remain confidential between wife and patient; do not inform other family  - 6/18: Per wife needs intermittent SPV goals, not realistic given therapies. He does well with cueing. Max A with sitting balance, transfers, and standing. Requires a lot skilled, verbal, and tactile cues for transferring due to no carryover during and between sessions. LLE shooting out. Burden of care remains high; will work more on STS and transfers than higher-level dynamics. Per SLP on full diet working on speech intelligibility, will do memory eval.  - 6/21: Updated wife on functional prognosis with PML, enforced need for ongoing support at home and anticipate some ongoing functional decline.  Must stay compliant with antiretroviral therapy.  Will discuss with team possibility of powered mobility, given her own functional deficits.   - 6/24: Worsening overall deficits in spasticity, vision, and nausea/vomitting over the weekend; expect progression of PML. MRI w/ and w/o brain pending; Neurology consulted for recommentations  - 6/25: Further cognitive and physical decline, poor safety awareness, D3 diet, spasticity seems somewhat better today.  Awaiting a.m. labs, suspect worsening hyponatremia, start IV  fluids 100 cc/h.  - 6/26: MUCH improved cognition/hemineglect with medication adjustments and sodium correction   - 6/27: Per teams, will be heavy assist at home, unsure if family will be able to accommodate safely.  Patient and family extremely motivated for discharge home.  Had discussion with wife, patient; both agreeable to palliative care consult for goals of care and dispo planning discussion.   - 6/28: Palliative consult  - prognosis 6-12 months; rec'd home with palliative nursing and option to transition to hospice - Therapies and medical team with substantial concerns regarding wife's ability to provide care in the home setting, will extend length of stay through Monday next week to provide as many opportunities for hand on caregiver training as possible.  Team overall recommending SNF given substantial burden of care, however family extremely motivated for 1 to 2-week trial of discharge home.   - [employer] [facility] eval 7/3  2.  Antithrombotics: -DVT/anticoagulation:  Pharmaceutical: Eliquis             -antiplatelet therapy: none   3. 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  Scheduled Tylenol 1000 mg 3 times daily, add tramadol 50 mg twice daily as needed.   - 6/28: D/t medication issues with tramadol and ongoing pain, added oxycodone 5 mg BID PRn   6/29- pt reports meds helping L hip pain  4. Mood/Behavior/Sleep: LCSW to evaluate and provide emotional support             -antipsychotic agents: n/a   5. Neuropsych/cognition: This patient is capable of making decisions on his own behalf.   - SLP eval   - Neuropsych eval 6/18: While the patient clearly had some cognitive issues most of these were noted having to do with patterns consistent with midbrain and subcortical dysfunction and dysregulation. Patient denied significant depression or anxiety and denied  significant coping difficulties with his current hospitalization although he is anxious about how much she will be able to improve even with  therapies.    - 6/21: Per wife, increasing lethargy, depression, and mood lability at home prior to admission.  Possibly signs of depression secondary to cognitive injury and functional decline.  Initiate activating SSRI Lexapro 10 mg daily.  - 6/25: Discontinue Lexapro, tramadol due to potential serotonergic drug interaction and SIADH contributing to emesis, confusion.   - 6/26: Per wife, patient's daughter passed suddenly this AM; team aware, will provide support as needed, neuropsych informed - appreciate recommendations  - 6/28: neuropsych saw patient, appreciate Dr. Marvetta Gibbons assistance in this patient's care  - 7/1: Resume Lexapro 10 mg, retimed for nightly.  Will monitor sodium.  -7-4: Increasing nausea, lethargy; treatment as below, may need to DC Lexapro if ongoing   6. Skin/Wound Care: Routine skin care checks  - Sacral stage 2 - offloading with foam and bed mobility  - Penile wounds - keep dry and monitor   7. Fluids/Electrolytes/Nutrition: Routine Is and Os and follow-up chemistries             -carb modified diet   - 6/14: Eating 25-100% meals. SLP dysphagia screen d/t mild dysarthria today.  -6/15  30- 100% intake, continue to monitor   -6/16 Ensure max daily ordered   - 6/24: Na 130; fluid restriction -> 132 w/ IVF -> 133-> 135 6/27   - 6/27: Discontinue IV fluid, transition to salt tabs 1 g twice daily.  Recheck BMP 6/29  - 6/29- up to 135- con't to monitor  - 7/1 - Na 137; uptrending, reduce to 1 g daily and monitor Na in 2-3 days  - 7/4: Na stable 136; AKI Cr 0.8 -> 1.4; start 75 cc/hr IVF and recheck in AM. Encourage PO fluids (drinking well under restriction).  7-5: NA stable 135, however creatinine remains elevated 1.39.  Increase IV fluids to 100 cc/h, Dc salt tabs, get urine studies UA and sodium.  Discussed with ID possible  contributor of Bactrim, appreciate Dr. Zenaida Niece Dam's insight; will hold Bactrim tomorrow a.m. and trend creatinine over the weekend along with G6PD reading.   - If no other causative agents identified, creatinine improves Sunday/Monday, and patient is not G6PD deficient, may switch to dapsone prior to discharge.   - If creatinine improves 7/6 a.m, given Bactrim was not held 7/5, will resume daily Bactrim and assume this was not contributor.   - Of note, both Biktarvy and Bactrim can cause false elevations of creatinine.   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 fib: continue Eliquis for PE  -HR controlled   15: DM-2; A1c = 6.6% on 04/10/2022; (no home meds listed)             -serum glucose range: 89-119             -continue carb modified diet  - No CBGs, monitor BID AC for now  - 6/16-17 very well-controlled; can consider DC if remain low with adequate PO   -  6/18: well controlled, POs 25-75%, DC CBG 6/19   - 6/26: Ketones in UA yesterday; BMP glucose remain <200; POs poor - working on POs today  -6/27: Feels much improved, BMP glucose normal, no further monitoring   16: Acute PE and right lower extremity DVT             -continue Eliquis -> 10 to 5 mg BID dosing 6/17    17: Obesity: BMI 32    18: Chest pain 6/11>>negative trop x 2; EKG non-acute             -attributed to PE versus musculoskeletal   - 6/14: Improved with lidoderm patch. Encourage incentive spirometry.  -6/16 reports this is improved today.   19. Constipation. Schedule Sennakot-S 1 tab QHS and add  PRN Miralax daily 17 g.   -6/15 reports improved with BM yesterday   -6/16 denies feeling constipated - add Miralax 17g BID    - LBM 6/23, large after at bedtime suppository - patient reports LBM 6/26; monitor  6/30- per nursing, did chart review, LBM was actually 6/23- will give Suppository today to get him to have BM   - 6/30 large BM with suppository; increase sennakots S to 2 tabs BID   - 7-3: Last BM, large, liquid  20. Spasticity/L hip pain. Extensor tone in [name] and LLE per therapies. - 6/18:  added Baclofen 5 mg TID. Consider PRAFO.  - 6/19: Benefit with ongoing mild spasticity, increased to 10 mg twice daily - Continue 10 mg TID - 6/25: Increased baclofen to 15 mg TID  - 6/27: Tone improving, will decrease to 10 mg 3 times daily to reduce confounding confusion - 6/28: Mildly increased tone, however comparable with prior days; monitor - 7/1: Tone seems improved everywhere except for left hip; encouraged use of oxycodone, reduce baclofen to 5 mg 3 times daily due to cognition and constipation -encouraged use of oxycodone as needed 7-2 7-3: Continue oxycodone as needed, spasticity medications, stretching and heat 7/4: Switch from baclofen to tizanidine 2 mg TID d/t AKI, ongoing constipation, nausea. Will get L hip x-ray, pending results may try Botox versus injection into right hip in AM. 7/5: Xray without arthritis. Hip flexor, adductor spasticity as above. Discussed with patient and wife risks and benefits of botulinum injection; both agree with this as plan. Performed as below: -----------------------------------------------------------------------------------   -----------------------------------------------------------------------------------------------------  21: Penile wounds, suspected HSV.  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  Completed treatment with valacyclovir 1000 mg twice daily for 14 days. Discussed with pharmacy that treatmenrt has been completed  22. Emesis/Nausea/constipation:  scopolamine patch ordered, improved, will d/c -> resume 6/24; she endorses improvement but continues to require as needed Zofran, worsening emesis.  Resolved -KUB without obstruction, severe constipation - scopalamine patch, Zofran PRN - No emesis recorded since 6/25 AM; did get IV yesterday afternoon - IVF as above; minimal POs last 2 days - 6/26: No PRNS today, continue Scopalamine patch and DC in AM if tolerating POs -6/27: No further as needed Zofran, DC scopolamine patch.  Tolerating p.o.'s better  6/29-6/30 pt denied nausea-eating breakfast heartily this AM  7/4: Appears daily nausea resumed about 2 days ago, no association with food intakes or medications.  May be related to spasticity in left leg.  Last BM 7-2, large, liquid; has had intermittent constipation and generally needing suppository for bowel movements.  Check KUB today, as needed sorbitol  7/5: KUB with dilated small bowel loop, ?developming illeus; tolerating POs, passing gas; IFV, encourage  bowel regimen, and DC oxy and baclofen  23. Right ear decreased hearing: debrox ear drops ordered, improved, will d/c  6/27; resume Debrox stops for 3 days -resolved  24.  Encephalopathy/altered mental status -overall improved, continues some waxing and waning   -Neurology consulted, MRI with and without yesterday without any worsening of PML, low suspicion for IRIS at this time but will monitor closely.  Appreciate their recommendations.  -Has remained afebrile, normotensive, without gross signs of infection; however immunocompromise due to AIDs.  Will get a urine with reflex to culture, chest x-ray, KUB today.  -DC tramadol Lexapro due to possible serotonergic effects + SIADH potential causing altered mental status, worsening tone, and emesis.  -Labs yesterday significant for hyponatremia 130; placed on fluid restriction, pending labs today, start IV fluids 100 cc/h  -If no apparent identifiable cause, will consult ID for further  recommendations   - 6/26: Much improved with sodium increase as above; continue current Tx, BMP in AM, transition to PO fluids and salt tabs in AM  6/29- Na 135 this AM  25.  Urinary incontinence, intermittent. May be worsened due to IV fluids   -  UA negative, PVRs low, monitor  6/29- rechecked due to hematuria- had some large Hb in U/A however is negative otherwise.   7-1: See above, hematuria started with catheterization for urinalysis, likely traumatic cath and ongoing slow bleed due to Eliquis.  Hemoglobin has remained stable, can follow-up further as outpatient.  7-2: Per review, no gross hematuria, remains somewhat dark-colored but improving.    LOS: 24 days A FACE TO FACE EVALUATION WAS PERFORMED  Erick Colace 08/12/2022, 8:27 AM

## 2022-08-13 DIAGNOSIS — A812 Progressive multifocal leukoencephalopathy: Secondary | ICD-10-CM | POA: Diagnosis not present

## 2022-08-13 LAB — BASIC METABOLIC PANEL
Anion gap: 9 (ref 5–15)
BUN: 12 mg/dL (ref 6–20)
CO2: 24 mmol/L (ref 22–32)
Calcium: 9.2 mg/dL (ref 8.9–10.3)
Chloride: 102 mmol/L (ref 98–111)
Creatinine, Ser: 1.05 mg/dL (ref 0.61–1.24)
GFR, Estimated: >60 mL/min (ref >60)
Glucose, Bld: 111 mg/dL — ABNORMAL HIGH (ref 70–99)
Potassium: 4 mmol/L (ref 3.5–5.1)
Sodium: 135 mmol/L (ref 135–145)

## 2022-08-13 NOTE — Progress Notes (Signed)
Physical Therapy Discharge Summary  Patient Details  Name: Jose Lamb MRN: [id number] Date of Birth: 12/07/1960  Date of Discharge from PT service:August 13, 2022  Today's Date: 08/13/2022 PT Individual Time: 1000-1107 PT Individual Time Calculation (min): 67 min    Patient has met 0 of 0 long term goals due to regression in strength, ROM, coordination, balance.  Patient to discharge at a wheelchair level Total Assist.     Reasons goals not met: Regression in functional mobility 2/2 strength, balance and coordiantion deficits. Pt to discharge at total A level with use of hoyer lift for transfers.   Recommendation:  Patient will benefit from ongoing skilled PT services in home health setting to continue to advance safe functional mobility, address ongoing impairments in strength, coordination, balance, ROM, and minimize fall risk.  Equipment: Hosptial bed, hoyer lift, tilt in space wheelchair   Reasons for discharge: change in medical status, lack of progress toward goals, and discharge from hospital  Patient/family agrees with progress made and goals achieved: Yes  PT Discharge Pain Interference Pain Interference Pain Effect on Sleep: 0. Does not apply - I have not had any pain or hurting in the past 5 days Pain Interference with Therapy Activities: 2. Occasionally Pain Interference with Day-to-Day Activities: 2. Occasionally Cognition Overall Cognitive Status: Impaired/Different from baseline Arousal/Alertness: Awake/alert Orientation Level: Oriented X4 Awareness: Impaired Safety/Judgment: Impaired Sensation Sensation Light Touch: Impaired by gross assessment Light Touch Impaired Details: Absent LLE;Absent [name] Proprioception: Impaired by gross assessment Additional Comments: Absent sensation in L Coordination Gross Motor Movements are Fluid and Coordinated: No Fine Motor Movements are Fluid and Coordinated: No Coordination and Movement Description: impaired 2/2 L  hemi Finger Nose Finger Test: dysmetria R UE, UTA L UE Heel Shin Test: decreased motor planning/coordination bilaterally Motor  Motor Motor: Hemiplegia Motor - Skilled Clinical Observations: L sided hemiplegia  Mobility Bed Mobility Bed Mobility: Rolling Right;Rolling Left;Right Sidelying to Sit;Left Sidelying to Sit;Supine to Sit;Sitting - Scoot to [employer] of Bed;Sit to Supine;Scooting to [facility] Rolling Left: Moderate Assistance - Patient 50-74% Right Sidelying to Sit: 2 Helpers Left Sidelying to Sit: 2 Helpers Supine to Sit: 2 Helpers Sitting - Scoot to [place] of Bed: 2 Helpers Sit to Supine: 2 Helpers Transfers Sit to Stand: 2 Helpers Stand to Sit: 2 Counsellor Transfers: 2 Retail buyer Details: Tactile cues for initiation;Tactile cues for posture;Verbal cues for sequencing;Verbal cues for technique;Tactile cues for sequencing;Tactile cues for weight shifting;Visual cues/gestures for sequencing;Manual facilitation for weight shifting;Verbal cues for precautions/safety Transfer (Assistive device): None Locomotion  Gait Ambulation: Yes Gait Assistance: 2 Helpers Gait Distance (Feet): 12 Feet Assistive device: Rolling walker Gait Assistance Details: Verbal cues for gait pattern;Verbal cues for sequencing;Tactile cues for weight shifting;Verbal cues for safe use of DME/AE;Tactile cues for posture;Tactile cues for sequencing;Tactile cues for placement Gait Gait: Yes Gait Pattern: Impaired Gait Pattern: Shuffle Gait velocity: decr Stairs / Additional Locomotion Stairs: No Wheelchair Mobility Wheelchair Mobility: No (discharge home with tilt in space)  Trunk/Postural Assessment  Cervical Assessment Cervical Assessment: Exceptions to [facility] (forward head, right cervical rotation/lateral flexion) Thoracic Assessment Thoracic Assessment: Exceptions to [facility] (rounded shoulders) Lumbar Assessment Lumbar Assessment: Exceptions to [facility] (posterior pelvic tilt) Postural  Control Postural Control: Deficits on evaluation Head Control: Difficulty with neck extension and requires max cues Trunk Control: Very poor trunk control, pt has L lateral lean, anterior lean and posterior lean.  Pt cannot sit independently Righting Reactions: impaired and delayed Protective Responses: impaired and delayed Postural  Limitations: Requires max A to sit unsupported, Lateral leans on both sides  Balance Balance Balance Assessed: Yes Static Sitting Balance Static Sitting - Balance Support: Feet supported;Bilateral upper extremity supported Static Sitting - Level of Assistance: 3: Mod assist;2: Max assist Dynamic Sitting Balance Dynamic Sitting - Balance Support: Feet supported;Bilateral upper extremity supported Dynamic Sitting - Level of Assistance: 1: +1 Total assist Static Standing Balance Static Standing - Balance Support: During functional activity;Bilateral upper extremity supported Static Standing - Level of Assistance: 1: +2 Total assist Dynamic Standing Balance Dynamic Standing - Balance Support: Bilateral upper extremity supported;During functional activity Dynamic Standing - Level of Assistance: 1: +2 Total assist Dynamic Standing - Balance Activities: Lateral lean/weight shifting;Forward lean/weight shifting;Reaching for objects;Reaching across midline Extremity Assessment  RLE Assessment RLE Assessment: Exceptions to [facility] General Strength Comments: Grossly 3/5 except ankle dorsiflexion/planterflexion 4/5 LLE Assessment LLE Assessment: Exceptions to [facility] General Strength Comments: Grossly 0/5   Pt received seated in w/c at bedside, agreeable to PT session with emphasis on discharge preparation and NMR to address inattention, ROM, strength deficits. PT assessed pain interference, sensation, coordination, strength and balance. Pt requires mod A and max multimodal cueing for R contralateral and upward retrieval/placement of squiqs on mirror to address trunk  musculature deficits and decreased attention and visual scanning to the left. Pt requested to return to bed due to fatigue and buttock pain. Pt +2 for sit to stand with stedy and unable to complete transfer and pt required +3 for return to sitting in w/c due to increase in R LE tone. Pt left in care of nurse tech in w/c at bedside and PT recommended pt transferred via maxi move for safety.   Truitt Leep Truitt Leep PT, DPT  08/13/2022, 10:22 AM

## 2022-08-13 NOTE — Progress Notes (Signed)
Head to toe assessment on 08/13/2022  Patient reported pain as 0 on a 0-10 scale. Patient was alert and oriented X4. 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   Carotid pulses +2 bilaterally. Patient had slightly shallow breathing with slight wheezing bilaterally.   Heart sounds normal. Patient has left sided weakness.  He was unable to move his left arm and left leg for range of motion assessment.  Capillary refill was under 3 seconds.   Patient's radial and brachial pulses are +2 bilaterally.  Capillary refill was under 3 seconds.   Lower extremity pulses +1 bilaterally with +1 edema.   Skin is warm and dry with no skin breakdown noted.  I agree with this student's documentation.

## 2022-08-13 NOTE — Progress Notes (Addendum)
PROGRESS NOTE   Subjective/Complaints:  No c/os this am  Compleing OT ADL program, awaiting PT Aware of discharge in am  BMET reviewed stable bactrim resumed   ROS:  Neg CP, SOB, N/V?D   Except for HPI Objective:   No results found. Recent Labs    08/11/22 1433  HGB 13.0      Recent Labs    08/12/22 0538 08/13/22 0528  NA 137 135  K 4.0 4.0  CL 102 102  CO2 26 24  GLUCOSE 112* 111*  BUN 14 12  CREATININE 1.04 1.05  CALCIUM 9.2 9.2      Intake/Output Summary (Last 24 hours) at 08/13/2022 0801 Last data filed at 08/13/2022 0306 Gross per 24 hour  Intake 120 ml  Output 725 ml  Net -605 ml          Physical Exam: Vital Signs Blood pressure 131/76, pulse 77, temperature 98.1 F (36.7 C), temperature source Oral, resp. rate 18, height 5\' 11"  (1.803 m), weight 89 kg, SpO2 95 %.   General: No acute distress Mood and affect are appropriate Heart: Regular rate and rhythm no rubs murmurs or extra sounds Lungs: Clear to auscultation, breathing unlabored, no rales or wheezes Abdomen: Positive bowel sounds, soft nontender to palpation, nondistended Extremities: No clubbing, cyanosis, or edema Skin: No evidence of breakdown, no evidence of rash  MSK:      No apparent deformity.     [name] - 3- delt , bi tri 4/5 finger flexion 3- FE, poor motor control , slow movement   LLE: 0/5  Neurologic exam:  Cognition: AAO x 3,   + left hemineglect  - can attend with minimal effort;  + Delayed recall - ongoing + Delayed motor initiation - ongoing + Moderate cognitive deficit - ongoing -somewhat more brisk with memory, recall, and communication today  Language: Fluent, No substitutions or neoglisms. Mild dysarthria.  Insight: Good insight into current condition; Mood: Flat affect.  Sensation:  winces to pinch [name] and LLE    Assessment/Plan: 1. Functional deficits which require 3+ hours per day of  interdisciplinary therapy in a comprehensive inpatient rehab setting. Physiatrist is providing close team supervision and 24 hour management of active medical problems listed below. Physiatrist and rehab team continue to assess barriers to discharge/monitor patient progress toward functional and medical goals  Care Tool:  Bathing    Body parts bathed by patient: Right arm, Left arm, Chest, Abdomen, Front perineal area, Right upper leg, Left upper leg, Face   Body parts bathed by helper: Buttocks, Right lower leg, Left lower leg     Bathing assist Assist Level: Maximal Assistance - Patient 24 - 49% (bed level)     Upper Body Dressing/Undressing Upper body dressing   What is the patient wearing?: Pull over shirt    Upper body assist Assist Level: Maximal Assistance - Patient 25 - 49%    Lower Body Dressing/Undressing Lower body dressing      What is the patient wearing?: Pants     Lower body assist Assist for lower body dressing: Maximal Assistance - Patient 25 - 49%     Toileting Toileting    Toileting  assist Assist for toileting: Total Assistance - Patient < 25%     Transfers Chair/bed transfer  Transfers assist  Chair/bed transfer activity did not occur: Safety/medical concerns  Chair/bed transfer assist level: 2 Helpers     Locomotion Ambulation   Ambulation assist      Assist level: 2 helpers Assistive device: Walker-rolling Max distance: 12'   Walk 10 feet activity   Assist     Assist level: 2 helpers Assistive device: Walker-rolling   Walk 50 feet activity   Assist Walk 50 feet with 2 turns activity did not occur: Safety/medical concerns         Walk 150 feet activity   Assist Walk 150 feet activity did not occur: Safety/medical concerns         Walk 10 feet on uneven surface  activity   Assist Walk 10 feet on uneven surfaces activity did not occur: Safety/medical concerns         Wheelchair     Assist Is the  patient using a wheelchair?: Yes Type of Wheelchair: Manual    Wheelchair assist level: Minimal Assistance - Patient > 75% Max wheelchair distance: 76'    Wheelchair 50 feet with 2 turns activity    Assist        Assist Level: Minimal Assistance - Patient > 75%   Wheelchair 150 feet activity     Assist      Assist Level: Maximal Assistance - Patient 25 - 49%   Blood pressure 131/76, pulse 77, temperature 98.1 F (36.7 C), temperature source Oral, resp. rate 18, height 5\' 11"  (1.803 m), weight 89 kg, SpO2 95 %.  Medical Problem List and Plan: 1. 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 Pt with subsequent left greater than right weakness and sensory loss particularly involving the lower extremities.              -patient may shower             -ELOS/Goals:  Goals Max A/Total A, caregiver goals now - 08/14/22  DC date             - Continue CIR PT/OT/SLP   - Per family request, HIV/AIDs diagnosis to remain confidential between wife and patient; do not inform other family  - 6/18: Per wife needs intermittent SPV goals, not realistic given therapies. He does well with cueing. Max A with sitting balance, transfers, and standing. Requires a lot skilled, verbal, and tactile cues for transferring due to no carryover during and between sessions. LLE shooting out. Burden of care remains high; will work more on STS and transfers than higher-level dynamics. Per SLP on full diet working on speech intelligibility, will do memory eval.  - 6/21: Updated wife on functional prognosis with PML, enforced need for ongoing support at home and anticipate some ongoing functional decline.  Must stay compliant with antiretroviral therapy.  Will discuss with team possibility of powered mobility, given her own functional deficits.   - 6/24: Worsening overall deficits in spasticity, vision, and nausea/vomitting over  the weekend; expect progression of PML. MRI w/ and w/o brain pending; Neurology consulted for recommentations  - 6/25: Further cognitive and physical decline, poor safety awareness, D3 diet, spasticity seems somewhat better today.  Awaiting a.m. labs, suspect worsening hyponatremia, start IV fluids 100 cc/h.  - 6/26: MUCH improved cognition/hemineglect with medication adjustments and sodium correction   - 6/27: Per teams, will be heavy  assist at home, unsure if family will be able to accommodate safely.  Patient and family extremely motivated for discharge home.  Had discussion with wife, patient; both agreeable to palliative care consult for goals of care and dispo planning discussion.   - 6/28: Palliative consult  - prognosis 6-12 months; rec'd home with palliative nursing and option to transition to hospice - Therapies and medical team with substantial concerns regarding wife's ability to provide care in the home setting, will extend length of stay through Monday next week to provide as many opportunities for hand on caregiver training as possible.  Team overall recommending SNF given substantial burden of care, however family extremely motivated for 1 to 2-week trial of discharge home.   - [employer] WC eval 7/3  2.  Antithrombotics: -DVT/anticoagulation:  Pharmaceutical: Eliquis             -antiplatelet therapy: none   3. 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  Scheduled Tylenol 1000 mg 3 times daily, add tramadol 50 mg twice daily as needed.   - 6/28: D/t medication issues with tramadol and ongoing pain, added oxycodone 5 mg BID PRn   6/29- pt reports meds helping L hip pain  4. Mood/Behavior/Sleep: LCSW to evaluate and provide emotional support             -antipsychotic agents: n/a   5. Neuropsych/cognition: This patient is capable of making decisions on his  own behalf.   - SLP eval   - Neuropsych eval 6/18: While the patient clearly had some cognitive issues most of these were noted having to do with patterns consistent with midbrain and subcortical dysfunction and dysregulation. Patient denied significant depression or anxiety and denied significant coping difficulties with his current hospitalization although he is anxious about how much she will be able to improve even with therapies.    - 6/21: Per wife, increasing lethargy, depression, and mood lability at home prior to admission.  Possibly signs of depression secondary to cognitive injury and functional decline.  Initiate activating SSRI Lexapro 10 mg daily.  - 6/25: Discontinue Lexapro, tramadol due to potential serotonergic drug interaction and SIADH contributing to emesis, confusion.   - 6/26: Per wife, patient's daughter passed suddenly this AM; team aware, will provide support as needed, neuropsych informed - appreciate recommendations  - 6/28: neuropsych saw patient, appreciate Dr. Marvetta Gibbons assistance in this patient's care  - 7/1: Resume Lexapro 10 mg, retimed for nightly.  Will monitor sodium.  -7-4: Increasing nausea, lethargy; treatment as below, may need to DC Lexapro if ongoing   6. Skin/Wound Care: Routine skin care checks  - Sacral stage 2 - offloading with foam and bed mobility  - Penile wounds - keep dry and monitor   7. Fluids/Electrolytes/Nutrition: Routine Is and Os and follow-up chemistries             -carb modified diet   - 6/14: Eating 25-100% meals. SLP dysphagia screen d/t mild dysarthria today.  -6/15  30- 100% intake, continue to monitor   -6/16 Ensure max daily ordered   - 6/24: Na 130; fluid restriction -> 132 w/ IVF -> 133-> 135 6/27   - 6/27: Discontinue IV fluid, transition to salt tabs 1 g twice daily.  Recheck BMP 6/29  - 6/29- up to 135- con't to monitor  - 7/1 - Na 137; uptrending, reduce to 1  g daily and monitor Na in 2-3 days  - 7/4: Na stable 136;  AKI Cr 0.8 -> 1.4; start 75 cc/hr IVF and recheck in AM. Encourage PO fluids (drinking well under restriction).  7-5: NA stable 135, however creatinine remains elevated 1.39.  Increase IV fluids to 100 cc/h, Dc salt tabs, get urine studies UA and sodium.  Discussed with ID possible contributor of Bactrim, appreciate Dr. Zenaida Niece Dam's insight; will hold Bactrim tomorrow a.m. and trend creatinine over the weekend along with G6PD reading.   - If no other causative agents identified, creatinine improves Sunday/Monday, and patient is not G6PD deficient, may switch to dapsone prior to discharge.   - If creatinine improves 7/6 a.m, given Bactrim was not held 7/5, will resume daily Bactrim and assume this was not contributor.   - Of note, both Biktarvy and Bactrim can cause false elevations of creatinine.   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 fib: continue Eliquis for PE  -HR controlled   15: DM-2; A1c = 6.6% on 04/10/2022; (no home meds listed)             -serum glucose range: 89-119             -continue carb modified diet  - No CBGs, monitor BID AC for now  - 6/16-17 very well-controlled; can consider DC if remain low with adequate PO   - 6/18: well controlled, POs 25-75%, DC CBG 6/19   - 6/26: Ketones in UA yesterday; BMP glucose remain <200; POs poor - working on POs today  -6/27: Feels much improved, BMP glucose normal, no further monitoring   16: Acute PE and right lower extremity DVT             -continue Eliquis -> 10 to 5 mg BID dosing 6/17    17: Obesity: BMI 32     18 : Chest pain 6/11>>negative trop x 2; EKG non-acute             -attributed to PE versus musculoskeletal   - 6/14: Improved with lidoderm patch. Encourage incentive spirometry.  -6/16 reports this is improved today.   19. Constipation. Schedule Sennakot-S 1 tab QHS and add PRN Miralax daily 17 g.   -6/15 reports improved with BM yesterday   -6/16 denies feeling constipated - add Miralax 17g BID    - LBM 6/23, large after at bedtime suppository - patient reports LBM 6/26; monitor  6/30- per nursing, did chart review, LBM was actually 6/23- will give Suppository today to get him to have BM   - 6/30 large BM with suppository; increase sennakots S to 2 tabs BID   - 7-3: Last BM, large, liquid  20. Spasticity/L hip pain. Extensor tone in [name] and LLE per therapies. - 6/18:  added Baclofen 5 mg TID. Consider PRAFO.  - 6/19: Benefit with ongoing mild spasticity, increased to 10 mg twice daily - Continue 10 mg TID -  6/25: Increased baclofen to 15 mg TID  - 6/27: Tone improving, will decrease to 10 mg 3 times daily to reduce confounding confusion - 6/28: Mildly increased tone, however comparable with prior days; monitor - 7/1: Tone seems improved everywhere except for left hip; encouraged use of oxycodone, reduce baclofen to 5 mg 3 times daily due to cognition and constipation -encouraged use of oxycodone as needed 7-2 7-3: Continue oxycodone as needed, spasticity medications, stretching and heat 7/4: Switch from baclofen to tizanidine 2 mg TID d/t AKI, ongoing constipation, nausea. Will get L hip x-ray, pending results may try Botox versus injection into right hip in AM. 7/5: Xray without arthritis. Hip flexor, adductor spasticity as above. Discussed with patient and wife risks and benefits of botulinum injection; both agree with this as plan. Performed as  below: -----------------------------------------------------------------------------------   -----------------------------------------------------------------------------------------------------  21: Penile wounds, suspected HSV.  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  Completed treatment with valacyclovir 1000 mg twice daily for 14 days. Discussed with pharmacy that treatmenrt has been completed  22. Emesis/Nausea/constipation: scopolamine patch ordered, improved, will d/c -> resume 6/24; she endorses improvement but continues to require as needed Zofran, worsening emesis.  Resolved -KUB without obstruction, severe constipation - scopalamine patch, Zofran PRN - No emesis recorded since 6/25 AM; did get IV yesterday afternoon - IVF as above; minimal POs last 2 days - 6/26: No PRNS today, continue Scopalamine patch and DC in AM if tolerating POs -6/27: No further as needed Zofran, DC scopolamine patch.  Tolerating p.o.'s better  6/29-6/30 pt denied nausea-eating breakfast heartily this AM  7/4: Appears daily nausea resumed about 2 days ago, no association with food intakes or medications.  May be related to spasticity in left leg.  Last BM 7-2, large, liquid; has had intermittent constipation and generally needing suppository for bowel movements.  Check KUB today, as needed sorbitol  7/5: KUB with dilated small bowel loop, ?developming illeus; tolerating POs, passing gas; IFV, encourage bowel regimen, and DC oxy and baclofen  23. Right ear decreased hearing: debrox ear drops ordered, improved, will d/c  6/27; resume Debrox stops for 3 days -resolved  24.  Encephalopathy/altered mental status -overall improved, continues some waxing and waning   -Neurology consulted, MRI with and without yesterday without any worsening of PML, low suspicion for IRIS at this time but will monitor closely.  Appreciate their recommendations.  -Has remained afebrile, normotensive, without gross signs of infection; however  immunocompromise due to AIDs.  Will get a urine with reflex to culture, chest x-ray, KUB today.  -DC tramadol Lexapro due to possible serotonergic effects + SIADH potential causing altered mental status, worsening tone, and emesis.  -Labs yesterday significant for hyponatremia 130; placed on fluid restriction, pending labs today, start IV fluids 100 cc/h  -If no apparent identifiable cause, will consult ID for further recommendations   - 6/26: Much improved with sodium increase as above; continue current Tx, BMP in AM, transition to PO fluids and salt tabs in AM  6/29- Na 135 this AM  25.  Urinary incontinence, intermittent. May be worsened due to IV fluids   -  UA negative, PVRs low, monitor  6/29- rechecked due to hematuria- had some large Hb in U/A however is negative otherwise.   7-1: See above, hematuria started with catheterization for urinalysis, likely traumatic cath and ongoing slow bleed due to Eliquis.  Hemoglobin has remained stable, can follow-up further as outpatient.  7-2: Per review, no gross hematuria, remains somewhat dark-colored but improving.  LOS: 25 days A FACE TO FACE EVALUATION WAS PERFORMED  Erick Colace 08/13/2022, 8:01 AM

## 2022-08-13 NOTE — Progress Notes (Signed)
Occupational Therapy Session Note  Patient Details  Name: Jose Lamb MRN: [id number] Date of Birth: [birth year]  Today's Date: 08/13/2022 OT Individual Time: [phone] OT Individual Time Calculation (min): 71 min    Short Term Goals: Week 4:  OT Short Term Goal 1 (Week 4): LTG=STG d/t ELOS  Skilled Therapeutic Interventions/Progress Updates:    Pt resting in bed upon arrival. Focus on bed mobility, tranfsers with Stedy, sitting balance, dressing at bed level and seated in w/c, self feeding, and activity tolerance in preparation for discharge home tomorrow. See below for assist level. Rolling R/L in bed with max A. Supine>sit EOB with HOB elevated and use of bed rails-mod A with max verbal cues. Sitting balance EOB with max A and max verbal cues. Pt with significant push to Lt. Sit>stand in [place] with mod A+2. Max A for sitting balance seated on Stedy to tranfser to w/c. Pt completed UB bathing/dressing tasks seated in w/c. Self feeding with setup. Pt remained in w/c with all needs within reach. Belt alarm activated.  Therapy Documentation Precautions:  Precautions Precautions: Fall Precaution Comments: Lt hemi, poor sitting/standing balance Restrictions Weight Bearing Restrictions: No    Pain:  Pt denies pain this morning ADL: ADL Eating: Supervision/safety Where Assessed-Eating: Bed level Grooming: Setup Where Assessed-Grooming: Sitting at sink Upper Body Bathing: Moderate assistance Where Assessed-Upper Body Bathing: Bed level Lower Body Bathing: Maximal assistance Where Assessed-Lower Body Bathing: Bed level Upper Body Dressing: Moderate assistance Where Assessed-Upper Body Dressing: Bed level Lower Body Dressing: Maximal assistance Where Assessed-Lower Body Dressing: Bed level Toileting: Maximal assistance Where Assessed-Toileting: Bed level Toilet Transfer: Dependent Toilet Transfer Method: Stand pivot Acupuncturist: Bedside commode, Grab  bars Tub/Shower Transfer: Dependent Tub/Shower Equipment: Other (comment) (Tilt shower chair with lateral support) Film/video editor: Dependent Film/video editor Method: Other (comment) (Tilt shower chair with lateral support) Astronomer: Grab bars, Other (comment) (Tilt shower chair with lateral support) ADL Comments: Reccomendation for d/c home is bed level ADLs pt requires Max assist for all UB/LB B/D tasks   Therapy/Group: Individual Therapy  Rich Brave 08/13/2022, 8:12 AM

## 2022-08-13 NOTE — Progress Notes (Cosign Needed Addendum)
Head to toe assessment on 08/13/2022  Patient reported pain as 0 on a 0-10 scale. Patient was alert and oriented X4. 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   Carotid pulses +2 bilaterally. Patient had slightly shallow breathing with slight wheezing bilaterally.   Heart sounds normal. Patient has left sided weakness.  He was unable to move his left arm and left leg for range of motion assessment.  Capillary refill was under 3 seconds.   Patient's radial and brachial pulses are +2 bilaterally.  Capillary refill was under 3 seconds.   Lower extremity pulses +1 bilaterally with +1 edema.   Skin is warm and dry with no skin breakdown noted.

## 2022-08-14 ENCOUNTER — Ambulatory Visit: Payer: Self-pay

## 2022-08-14 ENCOUNTER — Other Ambulatory Visit (HOSPITAL_COMMUNITY): Payer: Self-pay

## 2022-08-14 DIAGNOSIS — A812 Progressive multifocal leukoencephalopathy: Secondary | ICD-10-CM | POA: Diagnosis not present

## 2022-08-14 LAB — BASIC METABOLIC PANEL
Anion gap: 9 (ref 5–15)
BUN: 14 mg/dL (ref 6–20)
CO2: 23 mmol/L (ref 22–32)
Calcium: 9.1 mg/dL (ref 8.9–10.3)
Chloride: 102 mmol/L (ref 98–111)
Creatinine, Ser: 1.11 mg/dL (ref 0.61–1.24)
GFR, Estimated: >60 mL/min (ref >60)
Glucose, Bld: 103 mg/dL — ABNORMAL HIGH (ref 70–99)
Potassium: 3.8 mmol/L (ref 3.5–5.1)
Sodium: 134 mmol/L — ABNORMAL LOW (ref 135–145)

## 2022-08-14 LAB — CBC
HCT: 37.4 % — ABNORMAL LOW (ref 39.0–52.0)
Hemoglobin: 12.6 g/dL — ABNORMAL LOW (ref 13.0–17.0)
MCH: 29.9 pg (ref 26.0–34.0)
MCHC: 33.7 g/dL (ref 30.0–36.0)
MCV: 88.6 fL (ref 80.0–100.0)
Platelets: 185 10*3/uL (ref 150–400)
RBC: 4.22 MIL/uL (ref 4.22–5.81)
RDW: 14.7 % (ref 11.5–15.5)
WBC: 4.3 10*3/uL (ref 4.0–10.5)
nRBC: 0 % (ref 0.0–0.2)

## 2022-08-14 MED ORDER — ESCITALOPRAM OXALATE 10 MG PO TABS
10.0000 mg | ORAL_TABLET | Freq: Every day | ORAL | 0 refills | Status: AC
  Filled 2022-08-14: qty 30, 30d supply, fill #0

## 2022-08-14 MED ORDER — APIXABAN 5 MG PO TABS
5.0000 mg | ORAL_TABLET | Freq: Two times a day (BID) | ORAL | 0 refills | Status: AC
  Filled 2022-08-14: qty 60, 30d supply, fill #0

## 2022-08-14 MED ORDER — CYANOCOBALAMIN 1000 MCG PO TABS
1000.0000 ug | ORAL_TABLET | Freq: Every day | ORAL | 0 refills | Status: AC
  Filled 2022-08-14: qty 30, 30d supply, fill #0

## 2022-08-14 MED ORDER — SENNOSIDES-DOCUSATE SODIUM 8.6-50 MG PO TABS
2.0000 | ORAL_TABLET | Freq: Two times a day (BID) | ORAL | 0 refills | Status: AC | PRN
  Filled 2022-08-14: qty 60, 15d supply, fill #0

## 2022-08-14 MED ORDER — ROSUVASTATIN CALCIUM 10 MG PO TABS
10.0000 mg | ORAL_TABLET | Freq: Every day | ORAL | 0 refills | Status: AC
  Filled 2022-08-14: qty 30, 30d supply, fill #0

## 2022-08-14 MED ORDER — TIZANIDINE HCL 2 MG PO TABS
2.0000 mg | ORAL_TABLET | Freq: Three times a day (TID) | ORAL | 0 refills | Status: AC
  Filled 2022-08-14: qty 90, 30d supply, fill #0

## 2022-08-14 MED ORDER — SULFAMETHOXAZOLE-TRIMETHOPRIM 800-160 MG PO TABS
1.0000 | ORAL_TABLET | Freq: Every day | ORAL | 0 refills | Status: AC
  Filled 2022-08-14: qty 30, 30d supply, fill #0

## 2022-08-14 MED ORDER — ACETAMINOPHEN 500 MG PO TABS
1000.0000 mg | ORAL_TABLET | Freq: Three times a day (TID) | ORAL | 0 refills | Status: AC
  Filled 2022-08-14: qty 200, 34d supply, fill #0

## 2022-08-14 MED ORDER — BICTEGRAVIR-EMTRICITAB-TENOFOV 50-200-25 MG PO TABS
1.0000 | ORAL_TABLET | Freq: Every day | ORAL | 0 refills | Status: AC
  Filled 2022-08-14: qty 30, 30d supply, fill #0

## 2022-08-14 MED ORDER — ONDANSETRON HCL 4 MG PO TABS
4.0000 mg | ORAL_TABLET | Freq: Every day | ORAL | 1 refills | Status: AC | PRN
  Filled 2022-08-14: qty 30, 30d supply, fill #0

## 2022-08-14 NOTE — Plan of Care (Signed)
  Problem: RH Swallowing Goal: LTG Patient will consume least restrictive diet using compensatory strategies with assistance (SLP) Description: LTG:  Patient will consume least restrictive diet using compensatory strategies with assistance (SLP) Outcome: Completed/Met   Problem: RH Expression Communication Goal: LTG Patient will increase speech intelligibility (SLP) Description: LTG: Patient will increase speech intelligibility at word/phrase/conversation level with cues, % of the time (SLP) Outcome: Completed/Met   Problem: RH Problem Solving Goal: LTG Patient will demonstrate problem solving for (SLP) Description: LTG:  Patient will demonstrate problem solving for basic/complex daily situations with cues  (SLP) Outcome: Completed/Met   

## 2022-08-14 NOTE — Progress Notes (Signed)
Inpatient Rehabilitation Care Coordinator Discharge Note   Patient Details  Name: Jose Lamb MRN: [id number] Date of Birth: [birth year]   Discharge location: D/c to home with his wife.  Length of Stay: 25 days  Discharge activity level: total A level with use of hoyer lift for transfers.  Home/community participation: Limited  Patient response WU:JWJXBJ Literacy - How often do you need to have someone help you when you read instructions, pamphlets, or other written material from your doctor or pharmacy?: Never  Patient response YN:WGNFAO Isolation - How often do you feel lonely or isolated from those around you?: Never  Services provided included: MD, RD, PT, OT, RN, SLP, CM, TR, Pharmacy, Neuropsych, SW  Financial Services:  Field seismologist Utilized: Barrister's clerk  Choices offered to/list presented to: patient and wife  Follow-up services arranged:  Home Health, DME Home Health Agency: [facility] for HHPT/OT/SLP    [facility] : [facility] for [facility] bed and [facility]oyer lift    Patient response to transportation need: Is the patient able to respond to transportation needs?: Yes In the past 12 months, has lack of transportation kept you from medical appointments or from getting medications?: No In the past 12 months, has lack of transportation kept you from meetings, work, or from getting things needed for daily living?: No   Patient/Family verbalized understanding of follow-up arrangements:  Yes  Individual responsible for coordination of the follow-up plan: contact pt wife [name] [phone]  Confirmed correct DME delivered: Gretchen Short 08/14/2022    Comments (or additional information):fam edu completed. Specialty w/c delivered to home by [employer].   Summary of Stay    Date/Time Discharge Planning CSW  08/08/22 0912 Plan is for pt to discharge to home with his wife who will provide 24/7 care. Plans for her to have support from family that will  come in to help assist with his care. FAm edu scheduled for TUes,Wed,Fri this week. [facility] for HHPT./OT/SLP. DME ordered- hoyer lift, hospital bed, TTB, 3in1 BSC with [facility]. SW will confirm there area no barriers to discharge. AAC  08/01/22 1528 D/c plan remain for pt to discharge to home with his wife who will be primary caregiver. Pt will need to be light Min Asst to supersvision as his wife has back issues. She states she will provide the support needed. Children and relativeds work during the day. [facility] for HHPT/OT/SLP. SW will confirm there are no barriers to discharge. AAC  07/24/22 1150 Plan is for pt to discharge to home with his wife who will be primary caregiver. Pt will need to be light Min Asst to supersvision as his wife has back issues. She states she will provide the support needed. Children and relativeds work during the day. SW will confirm there are no barriers to discharge. AAC       Jalessa Peyser A Lula Olszewski

## 2022-08-14 NOTE — Progress Notes (Signed)
PROGRESS NOTE   Subjective/Complaints:  No events overnight. Vitals stable Last BM 7/77  State hip pain significantly I'm[proved with botox over weekend, althoughy is still somewhat aching.   ROS: Denies fevers, chills, N/V, abdominal pain, constipation, diarrhea, SOB, cough, chest pain, new weakness or paraesthesias.    Except for HPI Objective:   No results found. Recent Labs    08/11/22 1433  HGB 13.0      Recent Labs    08/12/22 0538 08/13/22 0528  NA 137 135  K 4.0 4.0  CL 102 102  CO2 26 24  GLUCOSE 112* 111*  BUN 14 12  CREATININE 1.04 1.05  CALCIUM 9.2 9.2      Intake/Output Summary (Last 24 hours) at 08/14/2022 0758 Last data filed at 08/14/2022 0705 Gross per 24 hour  Intake 720 ml  Output 575 ml  Net 145 ml          Physical Exam: Vital Signs Blood pressure (!) 146/83, pulse 76, temperature 98.4 F (36.9 C), temperature source Oral, resp. rate 19, height 5\' 11"  (1.803 m), weight 89 kg, SpO2 94 %.   General: No acute distress. Laying in bed Mood and affect are appropriate Heart: Regular rate and rhythm no rubs murmurs or extra sounds Lungs: Clear to auscultation, breathing unlabored, no rales or wheezes Abdomen: Positive bowel sounds, soft nontender to palpation, nondistended Extremities: No clubbing, cyanosis, or edema Skin: No evidence of breakdown, no evidence of rash  MSK:      No apparent deformity.     [name] - 3- delt , bi tri 4/5 finger flexion 3- FE, poor motor control , slow movement   LLE: Wiggle bilateral toes, otherwise c/b hemineglect.  MAS 1 HF, MAS 2 adductors - improved  Neurologic exam:  Cognition: AAO x 3,   + left hemineglect  - can attend with minimal effort;  + Delayed recall - ongoing + Delayed motor initiation - ongoing + Moderate cognitive deficit - ongoing   Language: Fluent, No substitutions or neoglisms. Mild dysarthria.  Insight: Good insight into  current condition; Mood: Flat affect.  Sensation: Intact Bl UE and LE; prior mildly reduced L UE and LE    Assessment/Plan: 1. Functional deficits which require 3+ hours per day of interdisciplinary therapy in a comprehensive inpatient rehab setting. Physiatrist is providing close team supervision and 24 hour management of active medical problems listed below. Physiatrist and rehab team continue to assess barriers to discharge/monitor patient progress toward functional and medical goals  Care Tool:  Bathing    Body parts bathed by patient: Right arm, Left arm, Chest, Abdomen, Front perineal area, Right upper leg, Left upper leg, Face   Body parts bathed by helper: Buttocks, Right lower leg, Left lower leg     Bathing assist Assist Level: Maximal Assistance - Patient 24 - 49%     Upper Body Dressing/Undressing Upper body dressing   What is the patient wearing?: Pull over shirt    Upper body assist Assist Level: Moderate Assistance - Patient 50 - 74%    Lower Body Dressing/Undressing Lower body dressing      What is the patient wearing?: Pants  Lower body assist Assist for lower body dressing: Maximal Assistance - Patient 25 - 49%     Toileting Toileting    Toileting assist Assist for toileting: Total Assistance - Patient < 25%     Transfers Chair/bed transfer  Transfers assist  Chair/bed transfer activity did not occur: Safety/medical concerns  Chair/bed transfer assist level: 2 Helpers     Locomotion Ambulation   Ambulation assist      Assist level: 2 helpers Assistive device: Walker-rolling Max distance: 12'   Walk 10 feet activity   Assist     Assist level: 2 helpers Assistive device: Walker-rolling   Walk 50 feet activity   Assist Walk 50 feet with 2 turns activity did not occur: Safety/medical concerns (fatigue, strength deficits)         Walk 150 feet activity   Assist Walk 150 feet activity did not occur: Safety/medical  concerns         Walk 10 feet on uneven surface  activity   Assist Walk 10 feet on uneven surfaces activity did not occur: Safety/medical concerns (fatigue, strength, coordination deficits)         Wheelchair     Assist Is the patient using a wheelchair?: Yes Type of Wheelchair: Manual    Wheelchair assist level: Dependent - Patient 0% Max wheelchair distance: 150 ft    Wheelchair 50 feet with 2 turns activity    Assist        Assist Level: Dependent - Patient 0%   Wheelchair 150 feet activity     Assist      Assist Level: Dependent - Patient 0%   Blood pressure (!) 146/83, pulse 76, temperature 98.4 F (36.9 C), temperature source Oral, resp. rate 19, height 5\' 11"  (1.803 m), weight 89 kg, SpO2 94 %.  Medical Problem List and Plan: 1. 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 Pt with subsequent left greater than right weakness and sensory loss particularly involving the lower extremities.              -patient may shower             -ELOS/Goals:  Goals Max A/Total A, caregiver goals now - 08/14/22  DC date             - Continue CIR PT/OT/SLP   - Per family request, HIV/AIDs diagnosis to remain confidential between wife and patient; do not inform other family  - 6/18: Per wife needs intermittent SPV goals, not realistic given therapies. He does well with cueing. Max A with sitting balance, transfers, and standing. Requires a lot skilled, verbal, and tactile cues for transferring due to no carryover during and between sessions. LLE shooting out. Burden of care remains high; will work more on STS and transfers than higher-level dynamics. Per SLP on full diet working on speech intelligibility, will do memory eval.  - 6/21: Updated wife on functional prognosis with PML, enforced need for ongoing support at home and anticipate some ongoing functional decline.  Must stay  compliant with antiretroviral therapy.  Will discuss with team possibility of powered mobility, given her own functional deficits.   - 6/24: Worsening overall deficits in spasticity, vision, and nausea/vomitting over the weekend; expect progression of PML. MRI w/ and w/o brain pending; Neurology consulted for recommentations  - 6/25: Further cognitive and physical decline, poor safety awareness, D3 diet, spasticity seems somewhat better today.  Awaiting a.m. labs, suspect worsening  hyponatremia, start IV fluids 100 cc/h.  - 6/26: MUCH improved cognition/hemineglect with medication adjustments and sodium correction   - 6/27: Per teams, will be heavy assist at home, unsure if family will be able to accommodate safely.  Patient and family extremely motivated for discharge home.  Had discussion with wife, patient; both agreeable to palliative care consult for goals of care and dispo planning discussion.   - 6/28: Palliative consult  - prognosis 6-12 months; rec'd home with palliative nursing and option to transition to hospice - Therapies and medical team with substantial concerns regarding wife's ability to provide care in the home setting, will extend length of stay through Monday next week to provide as many opportunities for hand on caregiver training as possible.  Team overall recommending SNF given substantial burden of care, however family extremely motivated for 1 to 2-week trial of discharge home.   - [employer] WC eval 7/3  - stable for discharge from I[facility] 7/8  2.  Antithrombotics: -DVT/anticoagulation:  Pharmaceutical: Eliquis             -antiplatelet therapy: none   3. 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  Scheduled Tylenol 1000 mg 3 times daily, add tramadol 50 mg twice daily as needed.   - 6/28: D/t medication issues with tramadol and ongoing pain, added  oxycodone 5 mg BID PRn   6/29- pt reports meds helping L hip pain  4. Mood/Behavior/Sleep: LCSW to evaluate and provide emotional support             -antipsychotic agents: n/a   5. Neuropsych/cognition: This patient is capable of making decisions on his own behalf.   - SLP eval   - Neuropsych eval 6/18: While the patient clearly had some cognitive issues most of these were noted having to do with patterns consistent with midbrain and subcortical dysfunction and dysregulation. Patient denied significant depression or anxiety and denied significant coping difficulties with his current hospitalization although he is anxious about how much she will be able to improve even with therapies.    - 6/21: Per wife, increasing lethargy, depression, and mood lability at home prior to admission.  Possibly signs of depression secondary to cognitive injury and functional decline.  Initiate activating SSRI Lexapro 10 mg daily.  - 6/25: Discontinue Lexapro, tramadol due to potential serotonergic drug interaction and SIADH contributing to emesis, confusion.   - 6/26: Per wife, patient's daughter passed suddenly this AM; team aware, will provide support as needed, neuropsych informed - appreciate recommendations  - 6/28: neuropsych saw patient, appreciate Dr. Marvetta Gibbons assistance in this patient's care  - 7/1: Resume Lexapro 10 mg, retimed for nightly.  Will monitor sodium.     6. Skin/Wound Care: Routine skin care checks  - Sacral stage 2 - offloading with foam and bed mobility  - Penile wounds - keep dry and monitor   7. Fluids/Electrolytes/Nutrition: Routine Is and Os and follow-up chemistries             -carb modified diet   - 6/14: Eating 25-100% meals. SLP dysphagia screen d/t mild dysarthria today.  -6/15  30- 100% intake, continue to monitor   -6/16 Ensure max daily ordered   - 6/24: Na 130; fluid restriction -> 132 w/ IVF -> 133-> 135 6/27   - 6/27: Discontinue IV fluid, transition to salt tabs 1  g twice daily.  Recheck BMP 6/29  - 6/29- up to 135- con't to monitor  - 7/1 - Na 137; uptrending, reduce to 1 g daily and monitor Na in 2-3 days  - 7/4: Na stable 136; AKI Cr 0.8 -> 1.4; start 75 cc/hr IVF and recheck in AM. Encourage PO fluids (drinking well under restriction).  7-5: NA stable 135, however creatinine remains elevated 1.39.  Increase IV fluids to 100 cc/h, Dc salt tabs, get urine studies UA and sodium.  Discussed with ID possible contributor of Bactrim, appreciate Dr. Zenaida Niece Dam's insight; will hold Bactrim tomorrow a.m. and trend creatinine over the weekend along with G6PD reading.   - If no other causative agents identified, creatinine improves Sunday/Monday, and patient is not G6PD deficient, may switch to dapsone prior to discharge.   - If creatinine improves 7/6 a.m, given Bactrim was not held 7/5, will resume daily Bactrim and assume this was not contributor.   - Of note, both Biktarvy and Bactrim can cause false elevations of creatinine.  7/8: S/p IVF creatinine improved to 1.04; no change with hold of Bactrim; resumed. NA remains 135-137 over weekend off of IVF and Na tabs.   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 fib: continue Eliquis for PE  -HR controlled   15: DM-2; A1c = 6.6% on 04/10/2022; (no home meds listed)             -serum glucose range: 89-119             -continue carb modified diet  - No CBGs, monitor BID AC for now  - 6/16-17 very well-controlled; can consider DC if remain low with adequate PO   - 6/18: well controlled, POs 25-75%,  DC CBG 6/19   - 6/26: Ketones in UA yesterday; BMP glucose remain <200; POs poor - working on POs today  -6/27: Feels much improved, BMP glucose normal, no further monitoring   16: Acute PE and right lower extremity DVT             -continue Eliquis -> 10 to 5 mg BID dosing 6/17    17: Obesity: BMI 32    18: Chest pain 6/11>>negative trop x 2; EKG non-acute             -attributed to PE versus musculoskeletal   - 6/14: Improved with lidoderm patch. Encourage incentive spirometry.  -6/16 reports this is improved today.   19. Constipation. Schedule Sennakot-S 1 tab QHS and add PRN Miralax daily 17 g.   -6/15 reports improved with BM yesterday   -6/16 denies feeling constipated - add Miralax 17g BID    - LBM 6/23, large after at bedtime suppository - patient reports LBM 6/26; monitor  6/30- per nursing, did chart review, LBM was actually 6/23- will give Suppository today to get him to have BM   - 6/30 large BM with suppository; increase sennakots S to 2 tabs BID   -  7-3: Last BM, large, liquid  20. Spasticity/L hip pain. Extensor tone in [name] and LLE per therapies. - 6/18:  added Baclofen 5 mg TID. Consider PRAFO.  - 6/19: Benefit with ongoing mild spasticity, increased to 10 mg twice daily - Continue 10 mg TID - 6/25: Increased baclofen to 15 mg TID  - 6/27: Tone improving, will decrease to 10 mg 3 times daily to reduce confounding confusion - 6/28: Mildly increased tone, however comparable with prior days; monitor - 7/1: Tone seems improved everywhere except for left hip; encouraged use of oxycodone, reduce baclofen to 5 mg 3 times daily due to cognition and constipation -encouraged use of oxycodone as needed 7-2 7-3: Continue oxycodone as needed, spasticity medications, stretching and heat 7/4: Switch from baclofen to tizanidine 2 mg TID d/t AKI, ongoing constipation, nausea. Will get L hip x-ray, pending results may try Botox versus injection into right hip in AM. 7/5: Xray without  arthritis. Hip flexor, adductor spasticity as above. Discussed with patient and wife risks and benefits of botulinum injection; both agree with this as plan. 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  Completed treatment with valacyclovir 1000 mg twice daily for 14 days. Discussed with pharmacy that treatmenrt has been completed  22. Emesis/Nausea/constipation: scopolamine patch ordered, improved, will d/c -> resume 6/24; she endorses improvement but continues to require as needed Zofran, worsening emesis.  Resolved -KUB without obstruction, severe constipation - scopalamine patch, Zofran PRN - No emesis recorded since 6/25 AM; did get IV yesterday afternoon - IVF as above; minimal POs last 2 days - 6/26: No PRNS today, continue Scopalamine patch and DC in AM if tolerating POs -6/27: No further as needed Zofran, DC scopolamine patch.  Tolerating p.o.'s better  6/29-6/30 pt denied nausea-eating breakfast heartily this AM  7/4: Appears daily nausea resumed about 2 days ago, no association with food intakes or medications.  May be related to spasticity in left leg.  Last BM 7-2, large, liquid; has had intermittent constipation and generally needing suppository for bowel movements.  Check KUB today, as needed sorbitol  7/5: KUB with dilated small bowel loop, ?developming illeus; tolerating POs, passing gas; IFV, encourage bowel regimen, and DC oxy and baclofen - 7/8: Tolerating with PRN zofran every 2-3 days; LBM 7/8, appears daily Bms of type 6; resolved  23. Right ear decreased hearing: debrox ear drops ordered, improved, will d/c  6/27; resume Debrox stops for 3 days -resolved  24.  Encephalopathy/altered mental status -overall improved, continues some waxing and waning   -Neurology consulted, MRI with and without yesterday without any worsening of PML, low suspicion for IRIS at this time but will monitor closely.   Appreciate their recommendations.  -Has remained afebrile, normotensive, without gross signs of infection; however immunocompromise due to AIDs.  Will get a urine with reflex to culture, chest x-ray, KUB today.  -DC tramadol Lexapro due to possible serotonergic effects + SIADH potential causing altered mental status, worsening tone, and emesis.  -Labs yesterday significant for hyponatremia 130; placed on fluid restriction, pending labs today, start IV fluids 100 cc/h  -If no apparent identifiable cause, will consult ID for further recommendations   - 6/26: Much improved with sodium increase as above; continue current Tx, BMP in AM, transition to PO fluids and salt tabs in AM  6/29- Na 135 this AM  25.  Urinary incontinence, intermittent. May be worsened due  to IV fluids and cognition. Ongoing  -  UA negative, PVRs low, monitor  6/29- rechecked due to hematuria- had some large Hb in U/A however is negative otherwise.   7-1: See above, hematuria started with catheterization for urinalysis, likely traumatic cath and ongoing slow bleed due to Eliquis.  Hemoglobin has remained stable, can follow-up further as outpatient.  7-2: Per review, no gross hematuria, remains somewhat dark-colored but improving.    LOS: 26 days A FACE TO FACE EVALUATION WAS PERFORMED  Angelina Sheriff 08/14/2022, 7:58 AM

## 2022-08-14 NOTE — Progress Notes (Signed)
Patient ID: Jose Lamb, male   DOB: Jun 02, 1960, 62 y.o.   MRN: [id number]  INPATIENT REHABILITATION DISCHARGE NOTE   Discharge instructions by: Wendi Maya, [name]  Verbalized understanding: Yes  Skin care/Wound care healing?CDI  Pain: None reported  IV's: removed  Tubes/Drains: N/A  O2: N/A  Safety instructions: Provided  Patient belongings: Returned  Discharged to: Home  Discharged via: Civil engineer, contracting

## 2022-08-14 NOTE — Progress Notes (Signed)
Speech Language Pathology Discharge Summary  Patient Details  Name: Jose Lamb MRN: [id number] Date of Birth: [birth year]  Date of Discharge from SLP service:August 13, 2022  Patient has met 3 of 3 long term goals.  Patient to discharge at overall Mod level.   Reasons goals not met: N/A   Clinical Impression/Discharge Summary: Patient has met 3 of 3 LTGs this admission despite having a functional decline due to the progressive nature of his diagnosis. Currently, patient is consuming Dys. 3 textures with thin liquids with minimal overt s/s of aspiration and overall Min verbal cues for use of swallowing compensatory strategies. Patient also requires overall Min-Mod A verbal cues for functional problem solving and for use of speech intelligibility strategies at the word and phrase level. Patient and family education is complete and patient will discharge home with 24 hour supervision from family. Patient would benefit from f/u SLP services to maximize his swallowing and cognitive functioning as well as his functional communication in order to reduce burden of care.   Care Partner:  Caregiver Able to Provide Assistance: Yes  Type of Caregiver Assistance: Physical;Cognitive  Recommendation:  Home Health SLP;24 hour supervision/assistance  Rationale for SLP Follow Up: Reduce caregiver burden;Maximize swallowing safety;Maximize cognitive function and independence;Maximize functional communication   Equipment: N/A   Reasons for discharge: Discharged from hospital   Patient/Family Agrees with Progress Made and Goals Achieved: Yes    Oralee Rapaport 08/14/2022, 6:29 AM

## 2022-08-14 NOTE — Progress Notes (Signed)
Patient ID: Jose Lamb, male   DOB: 01-29-1961, 62 y.o.   MRN: [id number]  SW returned phone call to pt wife to review discharge. Wife confirms hospital bed and hoyer received. SW will look into loaner w/c as she  has not had any follow-up, and will confirm with updates. She is aware SW is waiting on follow-up from palliative care in [place].  SW waiting on  updates from Brandon/Emily with [employer] on status of loaner w/c.   SW left message for [facility] [phone]/[phone]) to discuss submitting referral, and SW waiting on follow-up.   *SW submitted outpatient palliative care referral with Judeth Cornfield in palliative care. Will follow-up with the family.   Cecile Sheerer, MSW, LCSWA Office: [phone] Cell: [phone] Fax: [phone]

## 2022-08-14 NOTE — Progress Notes (Signed)
Inpatient Rehabilitation Discharge Medication Review by a Pharmacist  A complete drug regimen review was completed for this patient to identify any potential clinically significant medication issues.  High Risk Drug Classes Is patient taking? Indication by Medication  Antipsychotic No   Anticoagulant Yes Apixaban - PE and DVT  Antibiotic Yes Biktarvy, sulfamethoxazole-trimethoprim - HIV and PCP prophylaxis  Opioid No   Antiplatelet No   Hypoglycemics/insulin No   Vasoactive Medication No   Chemotherapy No   Other Yes Acetaminophen - pain Cyanocobalamin - supplement Escitalopram - mood stablization Rosuvastatin - hyperlipidemia Tizanidine - spasticity/muscle relaxation  PRN: Ondansetron - nausea/vomiting Senokot-docusate - constipation     Type of Medication Issue Identified Description of Issue Recommendation(s)  Drug Interaction(s) (clinically significant)     Duplicate Therapy     Allergy     No Medication Administration End Date     Incorrect Dose     Additional Drug Therapy Needed     Significant med changes from prior encounter (inform family/care partners about these prior to discharge). Multiple new medications. Discuss with patient/family prior to discharge.  Other       Clinically significant medication issues were identified that warrant physician communication and completion of prescribed/recommended actions by midnight of the next day:  No  Pharmacist comments:   Valtrex course completed 07/23/22, 2 weeks of antivirals.  Time spent performing this drug regimen review (minutes):  [address]   Dennie Fetters, [place] 08/14/2022 9:14 AM

## 2022-08-15 DIAGNOSIS — I82401 Acute embolism and thrombosis of unspecified deep veins of right lower extremity: Secondary | ICD-10-CM | POA: Diagnosis not present

## 2022-08-15 DIAGNOSIS — Z6826 Body mass index (BMI) 26.0-26.9, adult: Secondary | ICD-10-CM | POA: Diagnosis not present

## 2022-08-15 DIAGNOSIS — I77819 Aortic ectasia, unspecified site: Secondary | ICD-10-CM | POA: Diagnosis not present

## 2022-08-15 DIAGNOSIS — I480 Paroxysmal atrial fibrillation: Secondary | ICD-10-CM | POA: Diagnosis not present

## 2022-08-15 DIAGNOSIS — F02B3 Dementia in other diseases classified elsewhere, moderate, with mood disturbance: Secondary | ICD-10-CM | POA: Diagnosis not present

## 2022-08-15 DIAGNOSIS — E1169 Type 2 diabetes mellitus with other specified complication: Secondary | ICD-10-CM | POA: Diagnosis not present

## 2022-08-15 DIAGNOSIS — G8112 Spastic hemiplegia affecting left dominant side: Secondary | ICD-10-CM | POA: Diagnosis not present

## 2022-08-15 DIAGNOSIS — B003 Herpesviral meningitis: Secondary | ICD-10-CM | POA: Diagnosis not present

## 2022-08-15 DIAGNOSIS — K111 Hypertrophy of salivary gland: Secondary | ICD-10-CM | POA: Diagnosis not present

## 2022-08-15 DIAGNOSIS — E669 Obesity, unspecified: Secondary | ICD-10-CM | POA: Diagnosis not present

## 2022-08-15 DIAGNOSIS — A812 Progressive multifocal leukoencephalopathy: Secondary | ICD-10-CM | POA: Diagnosis not present

## 2022-08-15 DIAGNOSIS — J45909 Unspecified asthma, uncomplicated: Secondary | ICD-10-CM | POA: Diagnosis not present

## 2022-08-15 DIAGNOSIS — E0590 Thyrotoxicosis, unspecified without thyrotoxic crisis or storm: Secondary | ICD-10-CM | POA: Diagnosis not present

## 2022-08-15 DIAGNOSIS — E440 Moderate protein-calorie malnutrition: Secondary | ICD-10-CM | POA: Diagnosis not present

## 2022-08-15 DIAGNOSIS — B20 Human immunodeficiency virus [HIV] disease: Secondary | ICD-10-CM | POA: Diagnosis not present

## 2022-08-15 DIAGNOSIS — I2699 Other pulmonary embolism without acute cor pulmonale: Secondary | ICD-10-CM | POA: Diagnosis not present

## 2022-08-16 ENCOUNTER — Telehealth: Payer: Self-pay

## 2022-08-16 DIAGNOSIS — F02B3 Dementia in other diseases classified elsewhere, moderate, with mood disturbance: Secondary | ICD-10-CM | POA: Diagnosis not present

## 2022-08-16 DIAGNOSIS — E669 Obesity, unspecified: Secondary | ICD-10-CM | POA: Diagnosis not present

## 2022-08-16 DIAGNOSIS — A812 Progressive multifocal leukoencephalopathy: Secondary | ICD-10-CM | POA: Diagnosis not present

## 2022-08-16 DIAGNOSIS — Z6826 Body mass index (BMI) 26.0-26.9, adult: Secondary | ICD-10-CM | POA: Diagnosis not present

## 2022-08-16 DIAGNOSIS — B20 Human immunodeficiency virus [HIV] disease: Secondary | ICD-10-CM | POA: Diagnosis not present

## 2022-08-16 DIAGNOSIS — J45909 Unspecified asthma, uncomplicated: Secondary | ICD-10-CM | POA: Diagnosis not present

## 2022-08-16 DIAGNOSIS — I77819 Aortic ectasia, unspecified site: Secondary | ICD-10-CM | POA: Diagnosis not present

## 2022-08-16 DIAGNOSIS — I82401 Acute embolism and thrombosis of unspecified deep veins of right lower extremity: Secondary | ICD-10-CM | POA: Diagnosis not present

## 2022-08-16 DIAGNOSIS — G8112 Spastic hemiplegia affecting left dominant side: Secondary | ICD-10-CM | POA: Diagnosis not present

## 2022-08-16 DIAGNOSIS — E1169 Type 2 diabetes mellitus with other specified complication: Secondary | ICD-10-CM | POA: Diagnosis not present

## 2022-08-16 DIAGNOSIS — I480 Paroxysmal atrial fibrillation: Secondary | ICD-10-CM | POA: Diagnosis not present

## 2022-08-16 DIAGNOSIS — I2699 Other pulmonary embolism without acute cor pulmonale: Secondary | ICD-10-CM | POA: Diagnosis not present

## 2022-08-16 DIAGNOSIS — K111 Hypertrophy of salivary gland: Secondary | ICD-10-CM | POA: Diagnosis not present

## 2022-08-16 DIAGNOSIS — B003 Herpesviral meningitis: Secondary | ICD-10-CM | POA: Diagnosis not present

## 2022-08-16 DIAGNOSIS — E0590 Thyrotoxicosis, unspecified without thyrotoxic crisis or storm: Secondary | ICD-10-CM | POA: Diagnosis not present

## 2022-08-16 DIAGNOSIS — E440 Moderate protein-calorie malnutrition: Secondary | ICD-10-CM | POA: Diagnosis not present

## 2022-08-16 NOTE — Telephone Encounter (Signed)
[place] given to [place] with [facility] to provide in the home Speech Therapy one a week for 8 weeks. [facility] discharge summary reviewed). Call back phone [phone].

## 2022-08-17 DIAGNOSIS — B004 Herpesviral encephalitis: Secondary | ICD-10-CM | POA: Diagnosis not present

## 2022-08-17 DIAGNOSIS — B20 Human immunodeficiency virus [HIV] disease: Secondary | ICD-10-CM | POA: Diagnosis not present

## 2022-08-17 DIAGNOSIS — K5900 Constipation, unspecified: Secondary | ICD-10-CM | POA: Diagnosis not present

## 2022-08-17 DIAGNOSIS — R29898 Other symptoms and signs involving the musculoskeletal system: Secondary | ICD-10-CM | POA: Diagnosis not present

## 2022-08-22 ENCOUNTER — Ambulatory Visit: Payer: Self-pay | Admitting: Pharmacist

## 2022-08-22 ENCOUNTER — Emergency Department (HOSPITAL_COMMUNITY): Payer: BC Managed Care – PPO

## 2022-08-22 ENCOUNTER — Other Ambulatory Visit: Payer: Self-pay

## 2022-08-22 ENCOUNTER — Emergency Department (HOSPITAL_COMMUNITY)
Admission: EM | Admit: 2022-08-22 | Discharge: 2022-08-22 | Disposition: A | Discharge status: Home / Self Care | Payer: BC Managed Care – PPO | Attending: Emergency Medicine | Admitting: Emergency Medicine

## 2022-08-22 DIAGNOSIS — N401 Enlarged prostate with lower urinary tract symptoms: Secondary | ICD-10-CM | POA: Diagnosis not present

## 2022-08-22 DIAGNOSIS — Z7901 Long term (current) use of anticoagulants: Secondary | ICD-10-CM | POA: Diagnosis not present

## 2022-08-22 DIAGNOSIS — N134 Hydroureter: Secondary | ICD-10-CM | POA: Diagnosis not present

## 2022-08-22 DIAGNOSIS — R5381 Other malaise: Secondary | ICD-10-CM | POA: Diagnosis not present

## 2022-08-22 DIAGNOSIS — K4020 Bilateral inguinal hernia, without obstruction or gangrene, not specified as recurrent: Secondary | ICD-10-CM | POA: Diagnosis not present

## 2022-08-22 DIAGNOSIS — R531 Weakness: Secondary | ICD-10-CM | POA: Diagnosis not present

## 2022-08-22 DIAGNOSIS — R1111 Vomiting without nausea: Secondary | ICD-10-CM | POA: Diagnosis not present

## 2022-08-22 DIAGNOSIS — N202 Calculus of kidney with calculus of ureter: Secondary | ICD-10-CM | POA: Diagnosis not present

## 2022-08-22 DIAGNOSIS — E860 Dehydration: Secondary | ICD-10-CM | POA: Diagnosis not present

## 2022-08-22 DIAGNOSIS — N200 Calculus of kidney: Secondary | ICD-10-CM | POA: Diagnosis not present

## 2022-08-22 DIAGNOSIS — Z7401 Bed confinement status: Secondary | ICD-10-CM | POA: Diagnosis not present

## 2022-08-22 DIAGNOSIS — N132 Hydronephrosis with renal and ureteral calculous obstruction: Secondary | ICD-10-CM | POA: Insufficient documentation

## 2022-08-22 DIAGNOSIS — R1110 Vomiting, unspecified: Secondary | ICD-10-CM | POA: Diagnosis not present

## 2022-08-22 DIAGNOSIS — N3001 Acute cystitis with hematuria: Secondary | ICD-10-CM | POA: Diagnosis not present

## 2022-08-22 DIAGNOSIS — N201 Calculus of ureter: Secondary | ICD-10-CM

## 2022-08-22 LAB — CBC WITH DIFFERENTIAL/PLATELET
Abs Immature Granulocytes: 0.01 10*3/uL (ref 0.00–0.07)
Basophils Absolute: 0.1 10*3/uL (ref 0.0–0.1)
Basophils Relative: 1 %
Eosinophils Absolute: 0.1 10*3/uL (ref 0.0–0.5)
Eosinophils Relative: 2 %
HCT: 43.8 % (ref 39.0–52.0)
Hemoglobin: 14.9 g/dL (ref 13.0–17.0)
Immature Granulocytes: 0 %
Lymphocytes Relative: 23 %
Lymphs Abs: 1.1 10*3/uL (ref 0.7–4.0)
MCH: 29.7 pg (ref 26.0–34.0)
MCHC: 34 g/dL (ref 30.0–36.0)
MCV: 87.3 fL (ref 80.0–100.0)
Monocytes Absolute: 0.5 10*3/uL (ref 0.1–1.0)
Monocytes Relative: 10 %
Neutro Abs: 3.1 10*3/uL (ref 1.7–7.7)
Neutrophils Relative %: 64 %
Platelets: 234 10*3/uL (ref 150–400)
RBC: 5.02 MIL/uL (ref 4.22–5.81)
RDW: 14.4 % (ref 11.5–15.5)
WBC: 4.8 10*3/uL (ref 4.0–10.5)
nRBC: 0 % (ref 0.0–0.2)

## 2022-08-22 LAB — COMPREHENSIVE METABOLIC PANEL
ALT: 27 U/L (ref 0–44)
AST: 21 U/L (ref 15–41)
Albumin: 4 g/dL (ref 3.5–5.0)
Alkaline Phosphatase: 83 U/L (ref 38–126)
Anion gap: 10 (ref 5–15)
BUN: 24 mg/dL — ABNORMAL HIGH (ref 6–20)
CO2: 27 mmol/L (ref 22–32)
Calcium: 9.6 mg/dL (ref 8.9–10.3)
Chloride: 98 mmol/L (ref 98–111)
Creatinine, Ser: 1.39 mg/dL — ABNORMAL HIGH (ref 0.61–1.24)
GFR, Estimated: 58 mL/min — ABNORMAL LOW (ref >60)
Glucose, Bld: 119 mg/dL — ABNORMAL HIGH (ref 70–99)
Potassium: 3.8 mmol/L (ref 3.5–5.1)
Sodium: 135 mmol/L (ref 135–145)
Total Bilirubin: 0.7 mg/dL (ref 0.3–1.2)
Total Protein: 8.3 g/dL — ABNORMAL HIGH (ref 6.5–8.1)

## 2022-08-22 LAB — URINALYSIS, MICROSCOPIC (REFLEX)
RBC / HPF: >50 RBC/hpf (ref 0–5)
Squamous Epithelial / HPF: NONE SEEN /HPF (ref 0–5)

## 2022-08-22 LAB — URINALYSIS, ROUTINE W REFLEX MICROSCOPIC
Glucose, UA: NEGATIVE mg/dL
Ketones, ur: NEGATIVE mg/dL
Nitrite: POSITIVE — AB
Protein, ur: >300 mg/dL — AB
Specific Gravity, Urine: 1.015 (ref 1.005–1.030)
pH: 7.5 (ref 5.0–8.0)

## 2022-08-22 MED ORDER — ONDANSETRON 4 MG PO TBDP: 4.0000 mg | ORAL_TABLET | Freq: Three times a day (TID) | ORAL | 0 refills | Status: AC | PRN

## 2022-08-22 MED ORDER — SODIUM CHLORIDE 0.9 % IV SOLN
1.0000 g | Freq: Once | INTRAVENOUS | Status: AC
  Administered 2022-08-22: 1 g via INTRAVENOUS
  Filled 2022-08-22: qty 10

## 2022-08-22 MED ORDER — HYDROMORPHONE HCL 1 MG/ML IJ SOLN
0.5000 mg | Freq: Once | INTRAMUSCULAR | Status: AC
  Administered 2022-08-22: 0.5 mg via INTRAVENOUS
  Filled 2022-08-22: qty 0.5

## 2022-08-22 MED ORDER — IOHEXOL 300 MG/ML  SOLN
100.0000 mL | Freq: Once | INTRAMUSCULAR | Status: AC | PRN
  Administered 2022-08-22: 100 mL via INTRAVENOUS

## 2022-08-22 MED ORDER — CEPHALEXIN 500 MG PO CAPS: 500.0000 mg | ORAL_CAPSULE | Freq: Four times a day (QID) | ORAL | 0 refills | Status: AC

## 2022-08-22 MED ORDER — PANTOPRAZOLE SODIUM 40 MG IV SOLR
40.0000 mg | Freq: Once | INTRAVENOUS | Status: AC
  Administered 2022-08-22: 40 mg via INTRAVENOUS
  Filled 2022-08-22: qty 10

## 2022-08-22 MED ORDER — ONDANSETRON HCL 4 MG/2ML IJ SOLN
4.0000 mg | Freq: Once | INTRAMUSCULAR | Status: AC
  Administered 2022-08-22: 4 mg via INTRAVENOUS
  Filled 2022-08-22: qty 2

## 2022-08-22 MED ORDER — SODIUM CHLORIDE 0.9 % IV BOLUS
2000.0000 mL | Freq: Once | INTRAVENOUS | Status: AC
  Administered 2022-08-22: 2000 mL via INTRAVENOUS

## 2022-08-22 NOTE — ED Notes (Signed)
Pt repositioned, pillows placed under legs, head, and L side

## 2022-08-22 NOTE — ED Triage Notes (Signed)
Pt arrived via [facility] from home per family c/o N/V since lastnight and generalized weakness. Pt has PMH of a prior stroke in the past in which is has severe R sided deficits from. Also family if concerned for possible blood in stool as they think they may have saw some blood in pts brief. Pt is a DNR

## 2022-08-22 NOTE — ED Provider Notes (Signed)
[place] EMERGENCY DEPARTMENT AT [facility] Provider Note   CSN: [id number] Arrival date & time: 08/22/22  1005     History {Add pertinent medical, surgical, social history, OB history to HPI:1} Chief Complaint  Patient presents with   Emesis    Jose Lamb is a 62 y.o. male.  Patient has a history of a stroke.  Patient complains of vomiting.   Emesis      Home Medications Prior to Admission medications   Medication Sig Start Date End Date Taking? Authorizing Provider  acetaminophen (TYLENOL) 500 MG tablet Take 2 tablets (1,000 mg total) by mouth 3 (three) times daily. 08/14/22   Setzer, Lynnell Jude, [name]  apixaban (ELIQUIS) 5 MG TABS tablet Take 1 tablet (5 mg total) by mouth 2 (two) times daily. 08/14/22   Setzer, Lynnell Jude, [name]  bictegravir-emtricitabine-tenofovir AF (BIKTARVY) 50-200-25 MG TABS tablet Take 1 tablet by mouth daily. 08/14/22   Setzer, Lynnell Jude, [name]  cyanocobalamin 1000 MCG tablet Take 1 tablet (1,000 mcg total) by mouth daily. 08/14/22   Setzer, Lynnell Jude, [name]  escitalopram (LEXAPRO) 10 MG tablet Take 1 tablet (10 mg total) by mouth at bedtime. 08/14/22   Setzer, Lynnell Jude, [name]  ondansetron (ZOFRAN) 4 MG tablet Take 1 tablet (4 mg total) by mouth daily as needed for nausea or vomiting. 08/14/22 08/14/23  Setzer, Lynnell Jude, [name]  rosuvastatin (CRESTOR) 10 MG tablet Take 1 tablet (10 mg total) by mouth daily. 08/14/22   Setzer, Lynnell Jude, [name]  senna-docusate (SENOKOT-S) 8.6-50 MG tablet Take 2 tablets by mouth 2 (two) times daily as needed for mild constipation. 08/14/22   Setzer, Lynnell Jude, [name]  sulfamethoxazole-trimethoprim (BACTRIM DS) 800-160 MG tablet Take 1 tablet by mouth daily. 08/14/22   Setzer, Lynnell Jude, [name]  tiZANidine (ZANAFLEX) 2 MG tablet Take 1 tablet (2 mg total) by mouth 3 (three) times daily. 08/14/22   Setzer, Lynnell Jude, [name]      Allergies    Asa [aspirin]    Review of Systems   Review of Systems  Gastrointestinal:  Positive for vomiting.     Physical Exam Updated Vital Signs BP 135/88   Pulse 82   Temp 98.3 F (36.8 C) (Oral)   Resp 20   Ht 5\' 11"  (1.803 m)   Wt 89 kg   SpO2 92%   BMI 27.37 kg/m  Physical Exam  ED Results / Procedures / Treatments   Labs (all labs ordered are listed, but only abnormal results are displayed) Labs Reviewed  COMPREHENSIVE METABOLIC PANEL - Abnormal; Notable for the following components:      Result Value   Glucose, Bld 119 (*)    BUN 24 (*)    Creatinine, Ser 1.39 (*)    Total Protein 8.3 (*)    GFR, Estimated 58 (*)    All other components within normal limits  CBC WITH DIFFERENTIAL/PLATELET  URINALYSIS, ROUTINE W REFLEX MICROSCOPIC    EKG None  Radiology No results found.  Procedures Procedures  {Document cardiac monitor, telemetry assessment procedure when appropriate:1}  Medications Ordered in ED Medications  sodium chloride 0.9 % bolus 2,000 mL (0 mLs Intravenous Stopped 08/22/22 1309)  ondansetron (ZOFRAN) injection 4 mg (4 mg Intravenous Given 08/22/22 1136)  pantoprazole (PROTONIX) injection 40 mg (40 mg Intravenous Given 08/22/22 1136)  HYDROmorphone (DILAUDID) injection 0.5 mg (0.5 mg Intravenous Given 08/22/22 1201)  iohexol (OMNIPAQUE) 300 MG/ML solution 100 mL (100 mLs Intravenous Contrast Given 08/22/22 1326)    ED  Course/ Medical Decision Making/ A&P   {   Click here for ABCD2, HEART and other calculatorsREFRESH Note before signing :1}                          Medical Decision Making Amount and/or Complexity of Data Reviewed Labs: ordered. Radiology: ordered.  Risk Prescription drug management.   CT scan shows moderate-sized left ureteral stone.  {Document critical care time when appropriate:1} {Document review of labs and clinical decision tools ie heart score, Chads2Vasc2 etc:1}  {Document your independent review of radiology images, and any outside records:1} {Document your discussion with family members, caretakers, and with  consultants:1} {Document social determinants of health affecting pt's care:1} {Document your decision making why or why not admission, treatments were needed:1} Final Clinical Impression(s) / ED Diagnoses Final diagnoses:  None    Rx / DC Orders ED Discharge Orders     None

## 2022-08-22 NOTE — ED Notes (Signed)
Brief changed, assisted pt with use of urinal and peri care performed.

## 2022-08-22 NOTE — ED Provider Notes (Signed)
4:11 PM Assumed care of patient from off-going team. For more details, please see note from same day.  In brief, this is a 62 y.o.  male who p/w N/V, found to have Renal stone.   Plan/Dispo at time of sign-out & ED Course since sign-out: [ ]  UA; if infected, call urology  BP 135/88   Pulse 82   Temp 98.3 F (36.8 C) (Oral)   Resp 20   Ht 5\' 11"  (1.803 m)   Wt 89 kg   SpO2 92%   BMI 27.37 kg/m    ED Course:   Clinical Course as of 08/22/22 1826  Tue Aug 22, 2022  1642 D/w Dr. Alvester Morin who recommends keflex PO and strict return precautions including fever, and to call the clinic in the AM for an appt. Urine culture pending. Will give IV ceftriaxone here and dc w/ keflex. [HN]    Clinical Course User Index [HN] Loetta Rough, MD    Dispo: DC w/ discharge instructions/return precautions. DC'd w/ keflex and zofran. ODT. All questions answered to patient's satisfaction. Instructed to call urology in the AM and to return to ED if any concerning symptoms arise. ------------------------------- Vivi Barrack, MD Emergency Medicine  This note was created using dictation software, which may contain spelling or grammatical errors.   Loetta Rough, MD 08/22/22 [phone]

## 2022-08-22 NOTE — ED Notes (Signed)
Pt on the list to go back home via EMS

## 2022-08-22 NOTE — ED Notes (Signed)
Brief changed before transport

## 2022-08-22 NOTE — Discharge Instructions (Addendum)
Thank you for coming to [facility] Emergency Department. You were seen for nausea/vomiting. We did an exam, labs, and imaging, and these showed a kidney stone and possibly a UTI. We discussed with urology who felt it was okay to send you home, but with strict return precautions including fever. Please call the urology clinic in the AM [phone] at to make an appointment for the next 1-2 days. We have prescribed keflex to take four times per day for 5 days as well as zofran to use every 6-8 hours as needed for nausea/vomiting.  Do not hesitate to return to the ED or call 911 if you experience: -Worsening symptoms -Nausea/vomiting so severe you cannot eat/drink anything -Lightheadedness, passing out -Fevers/chills -Anything else that concerns you

## 2022-08-23 DIAGNOSIS — E440 Moderate protein-calorie malnutrition: Secondary | ICD-10-CM | POA: Diagnosis not present

## 2022-08-23 DIAGNOSIS — F02B3 Dementia in other diseases classified elsewhere, moderate, with mood disturbance: Secondary | ICD-10-CM | POA: Diagnosis not present

## 2022-08-23 DIAGNOSIS — F02B18 Dementia in other diseases classified elsewhere, moderate, with other behavioral disturbance: Secondary | ICD-10-CM | POA: Diagnosis not present

## 2022-08-23 DIAGNOSIS — E0590 Thyrotoxicosis, unspecified without thyrotoxic crisis or storm: Secondary | ICD-10-CM | POA: Diagnosis not present

## 2022-08-23 DIAGNOSIS — B20 Human immunodeficiency virus [HIV] disease: Secondary | ICD-10-CM | POA: Diagnosis not present

## 2022-08-23 DIAGNOSIS — B003 Herpesviral meningitis: Secondary | ICD-10-CM | POA: Diagnosis not present

## 2022-08-23 DIAGNOSIS — E1169 Type 2 diabetes mellitus with other specified complication: Secondary | ICD-10-CM | POA: Diagnosis not present

## 2022-08-23 DIAGNOSIS — I2699 Other pulmonary embolism without acute cor pulmonale: Secondary | ICD-10-CM | POA: Diagnosis not present

## 2022-08-23 DIAGNOSIS — Z515 Encounter for palliative care: Secondary | ICD-10-CM | POA: Diagnosis not present

## 2022-08-23 DIAGNOSIS — I77819 Aortic ectasia, unspecified site: Secondary | ICD-10-CM | POA: Diagnosis not present

## 2022-08-23 DIAGNOSIS — Z6826 Body mass index (BMI) 26.0-26.9, adult: Secondary | ICD-10-CM | POA: Diagnosis not present

## 2022-08-23 DIAGNOSIS — I82401 Acute embolism and thrombosis of unspecified deep veins of right lower extremity: Secondary | ICD-10-CM | POA: Diagnosis not present

## 2022-08-23 DIAGNOSIS — K111 Hypertrophy of salivary gland: Secondary | ICD-10-CM | POA: Diagnosis not present

## 2022-08-23 DIAGNOSIS — A812 Progressive multifocal leukoencephalopathy: Secondary | ICD-10-CM | POA: Diagnosis not present

## 2022-08-23 DIAGNOSIS — G8112 Spastic hemiplegia affecting left dominant side: Secondary | ICD-10-CM | POA: Diagnosis not present

## 2022-08-23 DIAGNOSIS — I480 Paroxysmal atrial fibrillation: Secondary | ICD-10-CM | POA: Diagnosis not present

## 2022-08-23 DIAGNOSIS — E669 Obesity, unspecified: Secondary | ICD-10-CM | POA: Diagnosis not present

## 2022-08-23 DIAGNOSIS — J45909 Unspecified asthma, uncomplicated: Secondary | ICD-10-CM | POA: Diagnosis not present

## 2022-08-23 LAB — URINE CULTURE

## 2022-08-24 DIAGNOSIS — G4733 Obstructive sleep apnea (adult) (pediatric): Secondary | ICD-10-CM | POA: Diagnosis not present

## 2022-08-25 DIAGNOSIS — G8112 Spastic hemiplegia affecting left dominant side: Secondary | ICD-10-CM | POA: Diagnosis not present

## 2022-08-25 DIAGNOSIS — B20 Human immunodeficiency virus [HIV] disease: Secondary | ICD-10-CM | POA: Diagnosis not present

## 2022-08-25 DIAGNOSIS — J45909 Unspecified asthma, uncomplicated: Secondary | ICD-10-CM | POA: Diagnosis not present

## 2022-08-25 DIAGNOSIS — I480 Paroxysmal atrial fibrillation: Secondary | ICD-10-CM | POA: Diagnosis not present

## 2022-08-25 DIAGNOSIS — E669 Obesity, unspecified: Secondary | ICD-10-CM | POA: Diagnosis not present

## 2022-08-25 DIAGNOSIS — F02B3 Dementia in other diseases classified elsewhere, moderate, with mood disturbance: Secondary | ICD-10-CM | POA: Diagnosis not present

## 2022-08-25 DIAGNOSIS — E1169 Type 2 diabetes mellitus with other specified complication: Secondary | ICD-10-CM | POA: Diagnosis not present

## 2022-08-25 DIAGNOSIS — I2699 Other pulmonary embolism without acute cor pulmonale: Secondary | ICD-10-CM | POA: Diagnosis not present

## 2022-08-25 DIAGNOSIS — I82401 Acute embolism and thrombosis of unspecified deep veins of right lower extremity: Secondary | ICD-10-CM | POA: Diagnosis not present

## 2022-08-25 DIAGNOSIS — K111 Hypertrophy of salivary gland: Secondary | ICD-10-CM | POA: Diagnosis not present

## 2022-08-25 DIAGNOSIS — A812 Progressive multifocal leukoencephalopathy: Secondary | ICD-10-CM | POA: Diagnosis not present

## 2022-08-25 DIAGNOSIS — E0590 Thyrotoxicosis, unspecified without thyrotoxic crisis or storm: Secondary | ICD-10-CM | POA: Diagnosis not present

## 2022-08-25 DIAGNOSIS — B003 Herpesviral meningitis: Secondary | ICD-10-CM | POA: Diagnosis not present

## 2022-08-25 DIAGNOSIS — I77819 Aortic ectasia, unspecified site: Secondary | ICD-10-CM | POA: Diagnosis not present

## 2022-08-25 DIAGNOSIS — Z6826 Body mass index (BMI) 26.0-26.9, adult: Secondary | ICD-10-CM | POA: Diagnosis not present

## 2022-08-25 DIAGNOSIS — E440 Moderate protein-calorie malnutrition: Secondary | ICD-10-CM | POA: Diagnosis not present

## 2022-08-28 DIAGNOSIS — I2699 Other pulmonary embolism without acute cor pulmonale: Secondary | ICD-10-CM | POA: Diagnosis not present

## 2022-08-28 DIAGNOSIS — Z6826 Body mass index (BMI) 26.0-26.9, adult: Secondary | ICD-10-CM | POA: Diagnosis not present

## 2022-08-28 DIAGNOSIS — E669 Obesity, unspecified: Secondary | ICD-10-CM | POA: Diagnosis not present

## 2022-08-28 DIAGNOSIS — G8112 Spastic hemiplegia affecting left dominant side: Secondary | ICD-10-CM | POA: Diagnosis not present

## 2022-08-28 DIAGNOSIS — E1169 Type 2 diabetes mellitus with other specified complication: Secondary | ICD-10-CM | POA: Diagnosis not present

## 2022-08-28 DIAGNOSIS — E0590 Thyrotoxicosis, unspecified without thyrotoxic crisis or storm: Secondary | ICD-10-CM | POA: Diagnosis not present

## 2022-08-28 DIAGNOSIS — I77819 Aortic ectasia, unspecified site: Secondary | ICD-10-CM | POA: Diagnosis not present

## 2022-08-28 DIAGNOSIS — B003 Herpesviral meningitis: Secondary | ICD-10-CM | POA: Diagnosis not present

## 2022-08-28 DIAGNOSIS — K111 Hypertrophy of salivary gland: Secondary | ICD-10-CM | POA: Diagnosis not present

## 2022-08-28 DIAGNOSIS — F02B3 Dementia in other diseases classified elsewhere, moderate, with mood disturbance: Secondary | ICD-10-CM | POA: Diagnosis not present

## 2022-08-28 DIAGNOSIS — E440 Moderate protein-calorie malnutrition: Secondary | ICD-10-CM | POA: Diagnosis not present

## 2022-08-28 DIAGNOSIS — I82401 Acute embolism and thrombosis of unspecified deep veins of right lower extremity: Secondary | ICD-10-CM | POA: Diagnosis not present

## 2022-08-28 DIAGNOSIS — A812 Progressive multifocal leukoencephalopathy: Secondary | ICD-10-CM | POA: Diagnosis not present

## 2022-08-28 DIAGNOSIS — B20 Human immunodeficiency virus [HIV] disease: Secondary | ICD-10-CM | POA: Diagnosis not present

## 2022-08-28 DIAGNOSIS — I480 Paroxysmal atrial fibrillation: Secondary | ICD-10-CM | POA: Diagnosis not present

## 2022-08-28 DIAGNOSIS — J45909 Unspecified asthma, uncomplicated: Secondary | ICD-10-CM | POA: Diagnosis not present

## 2022-08-30 DIAGNOSIS — I82401 Acute embolism and thrombosis of unspecified deep veins of right lower extremity: Secondary | ICD-10-CM | POA: Diagnosis not present

## 2022-08-30 DIAGNOSIS — E0590 Thyrotoxicosis, unspecified without thyrotoxic crisis or storm: Secondary | ICD-10-CM | POA: Diagnosis not present

## 2022-08-30 DIAGNOSIS — G8112 Spastic hemiplegia affecting left dominant side: Secondary | ICD-10-CM | POA: Diagnosis not present

## 2022-08-30 DIAGNOSIS — E440 Moderate protein-calorie malnutrition: Secondary | ICD-10-CM | POA: Diagnosis not present

## 2022-08-30 DIAGNOSIS — K111 Hypertrophy of salivary gland: Secondary | ICD-10-CM | POA: Diagnosis not present

## 2022-08-30 DIAGNOSIS — E1169 Type 2 diabetes mellitus with other specified complication: Secondary | ICD-10-CM | POA: Diagnosis not present

## 2022-08-30 DIAGNOSIS — E669 Obesity, unspecified: Secondary | ICD-10-CM | POA: Diagnosis not present

## 2022-08-30 DIAGNOSIS — I480 Paroxysmal atrial fibrillation: Secondary | ICD-10-CM | POA: Diagnosis not present

## 2022-08-30 DIAGNOSIS — J45909 Unspecified asthma, uncomplicated: Secondary | ICD-10-CM | POA: Diagnosis not present

## 2022-08-30 DIAGNOSIS — B003 Herpesviral meningitis: Secondary | ICD-10-CM | POA: Diagnosis not present

## 2022-08-30 DIAGNOSIS — A812 Progressive multifocal leukoencephalopathy: Secondary | ICD-10-CM | POA: Diagnosis not present

## 2022-08-30 DIAGNOSIS — I2699 Other pulmonary embolism without acute cor pulmonale: Secondary | ICD-10-CM | POA: Diagnosis not present

## 2022-08-30 DIAGNOSIS — Z6826 Body mass index (BMI) 26.0-26.9, adult: Secondary | ICD-10-CM | POA: Diagnosis not present

## 2022-08-30 DIAGNOSIS — F02B3 Dementia in other diseases classified elsewhere, moderate, with mood disturbance: Secondary | ICD-10-CM | POA: Diagnosis not present

## 2022-08-30 DIAGNOSIS — I77819 Aortic ectasia, unspecified site: Secondary | ICD-10-CM | POA: Diagnosis not present

## 2022-08-30 DIAGNOSIS — B20 Human immunodeficiency virus [HIV] disease: Secondary | ICD-10-CM | POA: Diagnosis not present

## 2022-08-31 DIAGNOSIS — I480 Paroxysmal atrial fibrillation: Secondary | ICD-10-CM | POA: Diagnosis not present

## 2022-08-31 DIAGNOSIS — Z6826 Body mass index (BMI) 26.0-26.9, adult: Secondary | ICD-10-CM | POA: Diagnosis not present

## 2022-08-31 DIAGNOSIS — I77819 Aortic ectasia, unspecified site: Secondary | ICD-10-CM | POA: Diagnosis not present

## 2022-08-31 DIAGNOSIS — K111 Hypertrophy of salivary gland: Secondary | ICD-10-CM | POA: Diagnosis not present

## 2022-08-31 DIAGNOSIS — E0590 Thyrotoxicosis, unspecified without thyrotoxic crisis or storm: Secondary | ICD-10-CM | POA: Diagnosis not present

## 2022-08-31 DIAGNOSIS — A812 Progressive multifocal leukoencephalopathy: Secondary | ICD-10-CM | POA: Diagnosis not present

## 2022-08-31 DIAGNOSIS — B20 Human immunodeficiency virus [HIV] disease: Secondary | ICD-10-CM | POA: Diagnosis not present

## 2022-08-31 DIAGNOSIS — E669 Obesity, unspecified: Secondary | ICD-10-CM | POA: Diagnosis not present

## 2022-08-31 DIAGNOSIS — B003 Herpesviral meningitis: Secondary | ICD-10-CM | POA: Diagnosis not present

## 2022-08-31 DIAGNOSIS — E1169 Type 2 diabetes mellitus with other specified complication: Secondary | ICD-10-CM | POA: Diagnosis not present

## 2022-08-31 DIAGNOSIS — J45909 Unspecified asthma, uncomplicated: Secondary | ICD-10-CM | POA: Diagnosis not present

## 2022-08-31 DIAGNOSIS — I2699 Other pulmonary embolism without acute cor pulmonale: Secondary | ICD-10-CM | POA: Diagnosis not present

## 2022-08-31 DIAGNOSIS — F02B3 Dementia in other diseases classified elsewhere, moderate, with mood disturbance: Secondary | ICD-10-CM | POA: Diagnosis not present

## 2022-08-31 DIAGNOSIS — E440 Moderate protein-calorie malnutrition: Secondary | ICD-10-CM | POA: Diagnosis not present

## 2022-08-31 DIAGNOSIS — I82401 Acute embolism and thrombosis of unspecified deep veins of right lower extremity: Secondary | ICD-10-CM | POA: Diagnosis not present

## 2022-08-31 DIAGNOSIS — G8112 Spastic hemiplegia affecting left dominant side: Secondary | ICD-10-CM | POA: Diagnosis not present

## 2022-09-06 DIAGNOSIS — A812 Progressive multifocal leukoencephalopathy: Secondary | ICD-10-CM | POA: Diagnosis not present

## 2022-09-06 DIAGNOSIS — I2699 Other pulmonary embolism without acute cor pulmonale: Secondary | ICD-10-CM | POA: Diagnosis not present

## 2022-09-06 DIAGNOSIS — E0590 Thyrotoxicosis, unspecified without thyrotoxic crisis or storm: Secondary | ICD-10-CM | POA: Diagnosis not present

## 2022-09-06 DIAGNOSIS — G8112 Spastic hemiplegia affecting left dominant side: Secondary | ICD-10-CM | POA: Diagnosis not present

## 2022-09-06 DIAGNOSIS — B20 Human immunodeficiency virus [HIV] disease: Secondary | ICD-10-CM | POA: Diagnosis not present

## 2022-09-06 DIAGNOSIS — K111 Hypertrophy of salivary gland: Secondary | ICD-10-CM | POA: Diagnosis not present

## 2022-09-06 DIAGNOSIS — E1169 Type 2 diabetes mellitus with other specified complication: Secondary | ICD-10-CM | POA: Diagnosis not present

## 2022-09-06 DIAGNOSIS — B003 Herpesviral meningitis: Secondary | ICD-10-CM | POA: Diagnosis not present

## 2022-09-06 DIAGNOSIS — E669 Obesity, unspecified: Secondary | ICD-10-CM | POA: Diagnosis not present

## 2022-09-06 DIAGNOSIS — E440 Moderate protein-calorie malnutrition: Secondary | ICD-10-CM | POA: Diagnosis not present

## 2022-09-06 DIAGNOSIS — I77819 Aortic ectasia, unspecified site: Secondary | ICD-10-CM | POA: Diagnosis not present

## 2022-09-06 DIAGNOSIS — J45909 Unspecified asthma, uncomplicated: Secondary | ICD-10-CM | POA: Diagnosis not present

## 2022-09-06 DIAGNOSIS — F02B3 Dementia in other diseases classified elsewhere, moderate, with mood disturbance: Secondary | ICD-10-CM | POA: Diagnosis not present

## 2022-09-06 DIAGNOSIS — Z6826 Body mass index (BMI) 26.0-26.9, adult: Secondary | ICD-10-CM | POA: Diagnosis not present

## 2022-09-06 DIAGNOSIS — I82401 Acute embolism and thrombosis of unspecified deep veins of right lower extremity: Secondary | ICD-10-CM | POA: Diagnosis not present

## 2022-09-06 DIAGNOSIS — I480 Paroxysmal atrial fibrillation: Secondary | ICD-10-CM | POA: Diagnosis not present

## 2022-09-07 ENCOUNTER — Ambulatory Visit: Payer: BC Managed Care – PPO | Admitting: Internal Medicine

## 2022-09-08 DIAGNOSIS — A812 Progressive multifocal leukoencephalopathy: Secondary | ICD-10-CM | POA: Diagnosis not present

## 2022-09-08 DIAGNOSIS — F02B18 Dementia in other diseases classified elsewhere, moderate, with other behavioral disturbance: Secondary | ICD-10-CM | POA: Diagnosis not present

## 2022-09-08 DIAGNOSIS — B20 Human immunodeficiency virus [HIV] disease: Secondary | ICD-10-CM | POA: Diagnosis not present

## 2022-09-08 DIAGNOSIS — Z515 Encounter for palliative care: Secondary | ICD-10-CM | POA: Diagnosis not present

## 2022-09-11 DIAGNOSIS — E0501 Thyrotoxicosis with diffuse goiter with thyrotoxic crisis or storm: Secondary | ICD-10-CM | POA: Diagnosis not present

## 2022-09-11 DIAGNOSIS — A6000 Herpesviral infection of urogenital system, unspecified: Secondary | ICD-10-CM | POA: Diagnosis not present

## 2022-09-11 DIAGNOSIS — E0500 Thyrotoxicosis with diffuse goiter without thyrotoxic crisis or storm: Secondary | ICD-10-CM | POA: Diagnosis not present

## 2022-09-11 DIAGNOSIS — G8114 Spastic hemiplegia affecting left nondominant side: Secondary | ICD-10-CM | POA: Diagnosis not present

## 2022-09-11 DIAGNOSIS — K111 Hypertrophy of salivary gland: Secondary | ICD-10-CM | POA: Diagnosis not present

## 2022-09-11 DIAGNOSIS — B20 Human immunodeficiency virus [HIV] disease: Secondary | ICD-10-CM | POA: Diagnosis not present

## 2022-09-11 DIAGNOSIS — J45909 Unspecified asthma, uncomplicated: Secondary | ICD-10-CM | POA: Diagnosis not present

## 2022-09-11 DIAGNOSIS — I2699 Other pulmonary embolism without acute cor pulmonale: Secondary | ICD-10-CM | POA: Diagnosis not present

## 2022-09-11 DIAGNOSIS — B003 Herpesviral meningitis: Secondary | ICD-10-CM | POA: Diagnosis not present

## 2022-09-11 DIAGNOSIS — F02B3 Dementia in other diseases classified elsewhere, moderate, with mood disturbance: Secondary | ICD-10-CM | POA: Diagnosis not present

## 2022-09-11 DIAGNOSIS — K219 Gastro-esophageal reflux disease without esophagitis: Secondary | ICD-10-CM | POA: Diagnosis not present

## 2022-09-11 DIAGNOSIS — G9341 Metabolic encephalopathy: Secondary | ICD-10-CM | POA: Diagnosis not present

## 2022-09-11 DIAGNOSIS — I480 Paroxysmal atrial fibrillation: Secondary | ICD-10-CM | POA: Diagnosis not present

## 2022-09-11 DIAGNOSIS — E785 Hyperlipidemia, unspecified: Secondary | ICD-10-CM | POA: Diagnosis not present

## 2022-09-11 DIAGNOSIS — A812 Progressive multifocal leukoencephalopathy: Secondary | ICD-10-CM | POA: Diagnosis not present

## 2022-09-11 DIAGNOSIS — E118 Type 2 diabetes mellitus with unspecified complications: Secondary | ICD-10-CM | POA: Diagnosis not present

## 2022-09-12 DIAGNOSIS — I480 Paroxysmal atrial fibrillation: Secondary | ICD-10-CM | POA: Diagnosis not present

## 2022-09-12 DIAGNOSIS — G8114 Spastic hemiplegia affecting left nondominant side: Secondary | ICD-10-CM | POA: Diagnosis not present

## 2022-09-12 DIAGNOSIS — G8194 Hemiplegia, unspecified affecting left nondominant side: Secondary | ICD-10-CM | POA: Diagnosis not present

## 2022-09-12 DIAGNOSIS — A812 Progressive multifocal leukoencephalopathy: Secondary | ICD-10-CM | POA: Diagnosis not present

## 2022-09-12 DIAGNOSIS — E0501 Thyrotoxicosis with diffuse goiter with thyrotoxic crisis or storm: Secondary | ICD-10-CM | POA: Diagnosis not present

## 2022-09-12 DIAGNOSIS — G9341 Metabolic encephalopathy: Secondary | ICD-10-CM | POA: Diagnosis not present

## 2022-09-12 DIAGNOSIS — F02B3 Dementia in other diseases classified elsewhere, moderate, with mood disturbance: Secondary | ICD-10-CM | POA: Diagnosis not present

## 2022-09-12 DIAGNOSIS — K219 Gastro-esophageal reflux disease without esophagitis: Secondary | ICD-10-CM | POA: Diagnosis not present

## 2022-09-12 DIAGNOSIS — A6000 Herpesviral infection of urogenital system, unspecified: Secondary | ICD-10-CM | POA: Diagnosis not present

## 2022-09-12 DIAGNOSIS — E785 Hyperlipidemia, unspecified: Secondary | ICD-10-CM | POA: Diagnosis not present

## 2022-09-12 DIAGNOSIS — E0500 Thyrotoxicosis with diffuse goiter without thyrotoxic crisis or storm: Secondary | ICD-10-CM | POA: Diagnosis not present

## 2022-09-12 DIAGNOSIS — I2699 Other pulmonary embolism without acute cor pulmonale: Secondary | ICD-10-CM | POA: Diagnosis not present

## 2022-09-12 DIAGNOSIS — J45909 Unspecified asthma, uncomplicated: Secondary | ICD-10-CM | POA: Diagnosis not present

## 2022-09-12 DIAGNOSIS — B003 Herpesviral meningitis: Secondary | ICD-10-CM | POA: Diagnosis not present

## 2022-09-12 DIAGNOSIS — B20 Human immunodeficiency virus [HIV] disease: Secondary | ICD-10-CM | POA: Diagnosis not present

## 2022-09-12 DIAGNOSIS — E118 Type 2 diabetes mellitus with unspecified complications: Secondary | ICD-10-CM | POA: Diagnosis not present

## 2022-09-12 DIAGNOSIS — K111 Hypertrophy of salivary gland: Secondary | ICD-10-CM | POA: Diagnosis not present

## 2022-09-13 DIAGNOSIS — B003 Herpesviral meningitis: Secondary | ICD-10-CM | POA: Diagnosis not present

## 2022-09-13 DIAGNOSIS — E0500 Thyrotoxicosis with diffuse goiter without thyrotoxic crisis or storm: Secondary | ICD-10-CM | POA: Diagnosis not present

## 2022-09-13 DIAGNOSIS — A6000 Herpesviral infection of urogenital system, unspecified: Secondary | ICD-10-CM | POA: Diagnosis not present

## 2022-09-13 DIAGNOSIS — I480 Paroxysmal atrial fibrillation: Secondary | ICD-10-CM | POA: Diagnosis not present

## 2022-09-13 DIAGNOSIS — G8114 Spastic hemiplegia affecting left nondominant side: Secondary | ICD-10-CM | POA: Diagnosis not present

## 2022-09-13 DIAGNOSIS — E118 Type 2 diabetes mellitus with unspecified complications: Secondary | ICD-10-CM | POA: Diagnosis not present

## 2022-09-13 DIAGNOSIS — J45909 Unspecified asthma, uncomplicated: Secondary | ICD-10-CM | POA: Diagnosis not present

## 2022-09-13 DIAGNOSIS — I2699 Other pulmonary embolism without acute cor pulmonale: Secondary | ICD-10-CM | POA: Diagnosis not present

## 2022-09-13 DIAGNOSIS — K219 Gastro-esophageal reflux disease without esophagitis: Secondary | ICD-10-CM | POA: Diagnosis not present

## 2022-09-13 DIAGNOSIS — G4733 Obstructive sleep apnea (adult) (pediatric): Secondary | ICD-10-CM | POA: Diagnosis not present

## 2022-09-13 DIAGNOSIS — E785 Hyperlipidemia, unspecified: Secondary | ICD-10-CM | POA: Diagnosis not present

## 2022-09-13 DIAGNOSIS — E0501 Thyrotoxicosis with diffuse goiter with thyrotoxic crisis or storm: Secondary | ICD-10-CM | POA: Diagnosis not present

## 2022-09-13 DIAGNOSIS — B20 Human immunodeficiency virus [HIV] disease: Secondary | ICD-10-CM | POA: Diagnosis not present

## 2022-09-13 DIAGNOSIS — A812 Progressive multifocal leukoencephalopathy: Secondary | ICD-10-CM | POA: Diagnosis not present

## 2022-09-13 DIAGNOSIS — F02B3 Dementia in other diseases classified elsewhere, moderate, with mood disturbance: Secondary | ICD-10-CM | POA: Diagnosis not present

## 2022-09-13 DIAGNOSIS — G9341 Metabolic encephalopathy: Secondary | ICD-10-CM | POA: Diagnosis not present

## 2022-09-13 DIAGNOSIS — K111 Hypertrophy of salivary gland: Secondary | ICD-10-CM | POA: Diagnosis not present

## 2022-09-14 DIAGNOSIS — K219 Gastro-esophageal reflux disease without esophagitis: Secondary | ICD-10-CM | POA: Diagnosis not present

## 2022-09-14 DIAGNOSIS — B003 Herpesviral meningitis: Secondary | ICD-10-CM | POA: Diagnosis not present

## 2022-09-14 DIAGNOSIS — G8114 Spastic hemiplegia affecting left nondominant side: Secondary | ICD-10-CM | POA: Diagnosis not present

## 2022-09-14 DIAGNOSIS — A6000 Herpesviral infection of urogenital system, unspecified: Secondary | ICD-10-CM | POA: Diagnosis not present

## 2022-09-14 DIAGNOSIS — J45909 Unspecified asthma, uncomplicated: Secondary | ICD-10-CM | POA: Diagnosis not present

## 2022-09-14 DIAGNOSIS — E785 Hyperlipidemia, unspecified: Secondary | ICD-10-CM | POA: Diagnosis not present

## 2022-09-14 DIAGNOSIS — A812 Progressive multifocal leukoencephalopathy: Secondary | ICD-10-CM | POA: Diagnosis not present

## 2022-09-14 DIAGNOSIS — F02B3 Dementia in other diseases classified elsewhere, moderate, with mood disturbance: Secondary | ICD-10-CM | POA: Diagnosis not present

## 2022-09-14 DIAGNOSIS — E118 Type 2 diabetes mellitus with unspecified complications: Secondary | ICD-10-CM | POA: Diagnosis not present

## 2022-09-14 DIAGNOSIS — B20 Human immunodeficiency virus [HIV] disease: Secondary | ICD-10-CM | POA: Diagnosis not present

## 2022-09-14 DIAGNOSIS — I480 Paroxysmal atrial fibrillation: Secondary | ICD-10-CM | POA: Diagnosis not present

## 2022-09-14 DIAGNOSIS — G9341 Metabolic encephalopathy: Secondary | ICD-10-CM | POA: Diagnosis not present

## 2022-09-14 DIAGNOSIS — E0501 Thyrotoxicosis with diffuse goiter with thyrotoxic crisis or storm: Secondary | ICD-10-CM | POA: Diagnosis not present

## 2022-09-14 DIAGNOSIS — K111 Hypertrophy of salivary gland: Secondary | ICD-10-CM | POA: Diagnosis not present

## 2022-09-14 DIAGNOSIS — I2699 Other pulmonary embolism without acute cor pulmonale: Secondary | ICD-10-CM | POA: Diagnosis not present

## 2022-09-14 DIAGNOSIS — E0500 Thyrotoxicosis with diffuse goiter without thyrotoxic crisis or storm: Secondary | ICD-10-CM | POA: Diagnosis not present

## 2022-09-15 DIAGNOSIS — E0501 Thyrotoxicosis with diffuse goiter with thyrotoxic crisis or storm: Secondary | ICD-10-CM | POA: Diagnosis not present

## 2022-09-15 DIAGNOSIS — A812 Progressive multifocal leukoencephalopathy: Secondary | ICD-10-CM | POA: Diagnosis not present

## 2022-09-15 DIAGNOSIS — E785 Hyperlipidemia, unspecified: Secondary | ICD-10-CM | POA: Diagnosis not present

## 2022-09-15 DIAGNOSIS — K219 Gastro-esophageal reflux disease without esophagitis: Secondary | ICD-10-CM | POA: Diagnosis not present

## 2022-09-15 DIAGNOSIS — E118 Type 2 diabetes mellitus with unspecified complications: Secondary | ICD-10-CM | POA: Diagnosis not present

## 2022-09-15 DIAGNOSIS — I2699 Other pulmonary embolism without acute cor pulmonale: Secondary | ICD-10-CM | POA: Diagnosis not present

## 2022-09-15 DIAGNOSIS — G8114 Spastic hemiplegia affecting left nondominant side: Secondary | ICD-10-CM | POA: Diagnosis not present

## 2022-09-15 DIAGNOSIS — K111 Hypertrophy of salivary gland: Secondary | ICD-10-CM | POA: Diagnosis not present

## 2022-09-15 DIAGNOSIS — A6000 Herpesviral infection of urogenital system, unspecified: Secondary | ICD-10-CM | POA: Diagnosis not present

## 2022-09-15 DIAGNOSIS — B003 Herpesviral meningitis: Secondary | ICD-10-CM | POA: Diagnosis not present

## 2022-09-15 DIAGNOSIS — E0500 Thyrotoxicosis with diffuse goiter without thyrotoxic crisis or storm: Secondary | ICD-10-CM | POA: Diagnosis not present

## 2022-09-15 DIAGNOSIS — I480 Paroxysmal atrial fibrillation: Secondary | ICD-10-CM | POA: Diagnosis not present

## 2022-09-15 DIAGNOSIS — F02B3 Dementia in other diseases classified elsewhere, moderate, with mood disturbance: Secondary | ICD-10-CM | POA: Diagnosis not present

## 2022-09-15 DIAGNOSIS — J45909 Unspecified asthma, uncomplicated: Secondary | ICD-10-CM | POA: Diagnosis not present

## 2022-09-15 DIAGNOSIS — B20 Human immunodeficiency virus [HIV] disease: Secondary | ICD-10-CM | POA: Diagnosis not present

## 2022-09-15 DIAGNOSIS — G9341 Metabolic encephalopathy: Secondary | ICD-10-CM | POA: Diagnosis not present

## 2022-09-16 DIAGNOSIS — G8114 Spastic hemiplegia affecting left nondominant side: Secondary | ICD-10-CM | POA: Diagnosis not present

## 2022-09-16 DIAGNOSIS — K219 Gastro-esophageal reflux disease without esophagitis: Secondary | ICD-10-CM | POA: Diagnosis not present

## 2022-09-16 DIAGNOSIS — J45909 Unspecified asthma, uncomplicated: Secondary | ICD-10-CM | POA: Diagnosis not present

## 2022-09-16 DIAGNOSIS — E0501 Thyrotoxicosis with diffuse goiter with thyrotoxic crisis or storm: Secondary | ICD-10-CM | POA: Diagnosis not present

## 2022-09-16 DIAGNOSIS — F02B3 Dementia in other diseases classified elsewhere, moderate, with mood disturbance: Secondary | ICD-10-CM | POA: Diagnosis not present

## 2022-09-16 DIAGNOSIS — K111 Hypertrophy of salivary gland: Secondary | ICD-10-CM | POA: Diagnosis not present

## 2022-09-16 DIAGNOSIS — G9341 Metabolic encephalopathy: Secondary | ICD-10-CM | POA: Diagnosis not present

## 2022-09-16 DIAGNOSIS — B20 Human immunodeficiency virus [HIV] disease: Secondary | ICD-10-CM | POA: Diagnosis not present

## 2022-09-16 DIAGNOSIS — A812 Progressive multifocal leukoencephalopathy: Secondary | ICD-10-CM | POA: Diagnosis not present

## 2022-09-16 DIAGNOSIS — I480 Paroxysmal atrial fibrillation: Secondary | ICD-10-CM | POA: Diagnosis not present

## 2022-09-16 DIAGNOSIS — E0500 Thyrotoxicosis with diffuse goiter without thyrotoxic crisis or storm: Secondary | ICD-10-CM | POA: Diagnosis not present

## 2022-09-16 DIAGNOSIS — B003 Herpesviral meningitis: Secondary | ICD-10-CM | POA: Diagnosis not present

## 2022-09-16 DIAGNOSIS — E118 Type 2 diabetes mellitus with unspecified complications: Secondary | ICD-10-CM | POA: Diagnosis not present

## 2022-09-16 DIAGNOSIS — E785 Hyperlipidemia, unspecified: Secondary | ICD-10-CM | POA: Diagnosis not present

## 2022-09-16 DIAGNOSIS — A6000 Herpesviral infection of urogenital system, unspecified: Secondary | ICD-10-CM | POA: Diagnosis not present

## 2022-09-16 DIAGNOSIS — I2699 Other pulmonary embolism without acute cor pulmonale: Secondary | ICD-10-CM | POA: Diagnosis not present

## 2022-09-17 DIAGNOSIS — I2699 Other pulmonary embolism without acute cor pulmonale: Secondary | ICD-10-CM | POA: Diagnosis not present

## 2022-09-17 DIAGNOSIS — A6000 Herpesviral infection of urogenital system, unspecified: Secondary | ICD-10-CM | POA: Diagnosis not present

## 2022-09-17 DIAGNOSIS — E0500 Thyrotoxicosis with diffuse goiter without thyrotoxic crisis or storm: Secondary | ICD-10-CM | POA: Diagnosis not present

## 2022-09-17 DIAGNOSIS — G9341 Metabolic encephalopathy: Secondary | ICD-10-CM | POA: Diagnosis not present

## 2022-09-17 DIAGNOSIS — B20 Human immunodeficiency virus [HIV] disease: Secondary | ICD-10-CM | POA: Diagnosis not present

## 2022-09-17 DIAGNOSIS — A812 Progressive multifocal leukoencephalopathy: Secondary | ICD-10-CM | POA: Diagnosis not present

## 2022-09-17 DIAGNOSIS — K111 Hypertrophy of salivary gland: Secondary | ICD-10-CM | POA: Diagnosis not present

## 2022-09-17 DIAGNOSIS — G8114 Spastic hemiplegia affecting left nondominant side: Secondary | ICD-10-CM | POA: Diagnosis not present

## 2022-09-17 DIAGNOSIS — E0501 Thyrotoxicosis with diffuse goiter with thyrotoxic crisis or storm: Secondary | ICD-10-CM | POA: Diagnosis not present

## 2022-09-17 DIAGNOSIS — K219 Gastro-esophageal reflux disease without esophagitis: Secondary | ICD-10-CM | POA: Diagnosis not present

## 2022-09-17 DIAGNOSIS — E118 Type 2 diabetes mellitus with unspecified complications: Secondary | ICD-10-CM | POA: Diagnosis not present

## 2022-09-17 DIAGNOSIS — J45909 Unspecified asthma, uncomplicated: Secondary | ICD-10-CM | POA: Diagnosis not present

## 2022-09-17 DIAGNOSIS — F02B3 Dementia in other diseases classified elsewhere, moderate, with mood disturbance: Secondary | ICD-10-CM | POA: Diagnosis not present

## 2022-09-17 DIAGNOSIS — B003 Herpesviral meningitis: Secondary | ICD-10-CM | POA: Diagnosis not present

## 2022-09-17 DIAGNOSIS — I480 Paroxysmal atrial fibrillation: Secondary | ICD-10-CM | POA: Diagnosis not present

## 2022-09-17 DIAGNOSIS — E785 Hyperlipidemia, unspecified: Secondary | ICD-10-CM | POA: Diagnosis not present

## 2022-09-18 DIAGNOSIS — E785 Hyperlipidemia, unspecified: Secondary | ICD-10-CM | POA: Diagnosis not present

## 2022-09-18 DIAGNOSIS — G9341 Metabolic encephalopathy: Secondary | ICD-10-CM | POA: Diagnosis not present

## 2022-09-18 DIAGNOSIS — E0501 Thyrotoxicosis with diffuse goiter with thyrotoxic crisis or storm: Secondary | ICD-10-CM | POA: Diagnosis not present

## 2022-09-18 DIAGNOSIS — B003 Herpesviral meningitis: Secondary | ICD-10-CM | POA: Diagnosis not present

## 2022-09-18 DIAGNOSIS — E118 Type 2 diabetes mellitus with unspecified complications: Secondary | ICD-10-CM | POA: Diagnosis not present

## 2022-09-18 DIAGNOSIS — B20 Human immunodeficiency virus [HIV] disease: Secondary | ICD-10-CM | POA: Diagnosis not present

## 2022-09-18 DIAGNOSIS — K219 Gastro-esophageal reflux disease without esophagitis: Secondary | ICD-10-CM | POA: Diagnosis not present

## 2022-09-18 DIAGNOSIS — K111 Hypertrophy of salivary gland: Secondary | ICD-10-CM | POA: Diagnosis not present

## 2022-09-18 DIAGNOSIS — A6000 Herpesviral infection of urogenital system, unspecified: Secondary | ICD-10-CM | POA: Diagnosis not present

## 2022-09-18 DIAGNOSIS — A812 Progressive multifocal leukoencephalopathy: Secondary | ICD-10-CM | POA: Diagnosis not present

## 2022-09-18 DIAGNOSIS — I2699 Other pulmonary embolism without acute cor pulmonale: Secondary | ICD-10-CM | POA: Diagnosis not present

## 2022-09-18 DIAGNOSIS — G8114 Spastic hemiplegia affecting left nondominant side: Secondary | ICD-10-CM | POA: Diagnosis not present

## 2022-09-18 DIAGNOSIS — F02B3 Dementia in other diseases classified elsewhere, moderate, with mood disturbance: Secondary | ICD-10-CM | POA: Diagnosis not present

## 2022-09-18 DIAGNOSIS — J45909 Unspecified asthma, uncomplicated: Secondary | ICD-10-CM | POA: Diagnosis not present

## 2022-09-18 DIAGNOSIS — E0500 Thyrotoxicosis with diffuse goiter without thyrotoxic crisis or storm: Secondary | ICD-10-CM | POA: Diagnosis not present

## 2022-09-18 DIAGNOSIS — I480 Paroxysmal atrial fibrillation: Secondary | ICD-10-CM | POA: Diagnosis not present

## 2022-09-19 DIAGNOSIS — E785 Hyperlipidemia, unspecified: Secondary | ICD-10-CM | POA: Diagnosis not present

## 2022-09-19 DIAGNOSIS — I2699 Other pulmonary embolism without acute cor pulmonale: Secondary | ICD-10-CM | POA: Diagnosis not present

## 2022-09-19 DIAGNOSIS — I480 Paroxysmal atrial fibrillation: Secondary | ICD-10-CM | POA: Diagnosis not present

## 2022-09-19 DIAGNOSIS — K111 Hypertrophy of salivary gland: Secondary | ICD-10-CM | POA: Diagnosis not present

## 2022-09-19 DIAGNOSIS — A812 Progressive multifocal leukoencephalopathy: Secondary | ICD-10-CM | POA: Diagnosis not present

## 2022-09-19 DIAGNOSIS — G8114 Spastic hemiplegia affecting left nondominant side: Secondary | ICD-10-CM | POA: Diagnosis not present

## 2022-09-19 DIAGNOSIS — B003 Herpesviral meningitis: Secondary | ICD-10-CM | POA: Diagnosis not present

## 2022-09-19 DIAGNOSIS — E118 Type 2 diabetes mellitus with unspecified complications: Secondary | ICD-10-CM | POA: Diagnosis not present

## 2022-09-19 DIAGNOSIS — G9341 Metabolic encephalopathy: Secondary | ICD-10-CM | POA: Diagnosis not present

## 2022-09-19 DIAGNOSIS — E0501 Thyrotoxicosis with diffuse goiter with thyrotoxic crisis or storm: Secondary | ICD-10-CM | POA: Diagnosis not present

## 2022-09-19 DIAGNOSIS — J45909 Unspecified asthma, uncomplicated: Secondary | ICD-10-CM | POA: Diagnosis not present

## 2022-09-19 DIAGNOSIS — A6000 Herpesviral infection of urogenital system, unspecified: Secondary | ICD-10-CM | POA: Diagnosis not present

## 2022-09-19 DIAGNOSIS — E0500 Thyrotoxicosis with diffuse goiter without thyrotoxic crisis or storm: Secondary | ICD-10-CM | POA: Diagnosis not present

## 2022-09-19 DIAGNOSIS — F02B3 Dementia in other diseases classified elsewhere, moderate, with mood disturbance: Secondary | ICD-10-CM | POA: Diagnosis not present

## 2022-09-19 DIAGNOSIS — K219 Gastro-esophageal reflux disease without esophagitis: Secondary | ICD-10-CM | POA: Diagnosis not present

## 2022-09-19 DIAGNOSIS — B20 Human immunodeficiency virus [HIV] disease: Secondary | ICD-10-CM | POA: Diagnosis not present

## 2022-09-20 ENCOUNTER — Encounter
Payer: BC Managed Care – PPO | Attending: Physical Medicine and Rehabilitation | Admitting: Physical Medicine and Rehabilitation

## 2022-09-20 DIAGNOSIS — E785 Hyperlipidemia, unspecified: Secondary | ICD-10-CM | POA: Diagnosis not present

## 2022-09-20 DIAGNOSIS — F02B3 Dementia in other diseases classified elsewhere, moderate, with mood disturbance: Secondary | ICD-10-CM | POA: Diagnosis not present

## 2022-09-20 DIAGNOSIS — A812 Progressive multifocal leukoencephalopathy: Secondary | ICD-10-CM | POA: Diagnosis not present

## 2022-09-20 DIAGNOSIS — K219 Gastro-esophageal reflux disease without esophagitis: Secondary | ICD-10-CM | POA: Diagnosis not present

## 2022-09-20 DIAGNOSIS — G8114 Spastic hemiplegia affecting left nondominant side: Secondary | ICD-10-CM | POA: Diagnosis not present

## 2022-09-20 DIAGNOSIS — G9341 Metabolic encephalopathy: Secondary | ICD-10-CM | POA: Diagnosis not present

## 2022-09-20 DIAGNOSIS — J45909 Unspecified asthma, uncomplicated: Secondary | ICD-10-CM | POA: Diagnosis not present

## 2022-09-20 DIAGNOSIS — E0501 Thyrotoxicosis with diffuse goiter with thyrotoxic crisis or storm: Secondary | ICD-10-CM | POA: Diagnosis not present

## 2022-09-20 DIAGNOSIS — E0500 Thyrotoxicosis with diffuse goiter without thyrotoxic crisis or storm: Secondary | ICD-10-CM | POA: Diagnosis not present

## 2022-09-20 DIAGNOSIS — B003 Herpesviral meningitis: Secondary | ICD-10-CM | POA: Diagnosis not present

## 2022-09-20 DIAGNOSIS — E118 Type 2 diabetes mellitus with unspecified complications: Secondary | ICD-10-CM | POA: Diagnosis not present

## 2022-09-20 DIAGNOSIS — A6000 Herpesviral infection of urogenital system, unspecified: Secondary | ICD-10-CM | POA: Diagnosis not present

## 2022-09-20 DIAGNOSIS — B20 Human immunodeficiency virus [HIV] disease: Secondary | ICD-10-CM | POA: Diagnosis not present

## 2022-09-20 DIAGNOSIS — K111 Hypertrophy of salivary gland: Secondary | ICD-10-CM | POA: Diagnosis not present

## 2022-09-20 DIAGNOSIS — I480 Paroxysmal atrial fibrillation: Secondary | ICD-10-CM | POA: Diagnosis not present

## 2022-09-20 DIAGNOSIS — I2699 Other pulmonary embolism without acute cor pulmonale: Secondary | ICD-10-CM | POA: Diagnosis not present

## 2022-09-21 DIAGNOSIS — F02B3 Dementia in other diseases classified elsewhere, moderate, with mood disturbance: Secondary | ICD-10-CM | POA: Diagnosis not present

## 2022-09-21 DIAGNOSIS — G8114 Spastic hemiplegia affecting left nondominant side: Secondary | ICD-10-CM | POA: Diagnosis not present

## 2022-09-21 DIAGNOSIS — K111 Hypertrophy of salivary gland: Secondary | ICD-10-CM | POA: Diagnosis not present

## 2022-09-21 DIAGNOSIS — E0500 Thyrotoxicosis with diffuse goiter without thyrotoxic crisis or storm: Secondary | ICD-10-CM | POA: Diagnosis not present

## 2022-09-21 DIAGNOSIS — A6000 Herpesviral infection of urogenital system, unspecified: Secondary | ICD-10-CM | POA: Diagnosis not present

## 2022-09-21 DIAGNOSIS — A812 Progressive multifocal leukoencephalopathy: Secondary | ICD-10-CM | POA: Diagnosis not present

## 2022-09-21 DIAGNOSIS — E785 Hyperlipidemia, unspecified: Secondary | ICD-10-CM | POA: Diagnosis not present

## 2022-09-21 DIAGNOSIS — B003 Herpesviral meningitis: Secondary | ICD-10-CM | POA: Diagnosis not present

## 2022-09-21 DIAGNOSIS — I2699 Other pulmonary embolism without acute cor pulmonale: Secondary | ICD-10-CM | POA: Diagnosis not present

## 2022-09-21 DIAGNOSIS — E118 Type 2 diabetes mellitus with unspecified complications: Secondary | ICD-10-CM | POA: Diagnosis not present

## 2022-09-21 DIAGNOSIS — B20 Human immunodeficiency virus [HIV] disease: Secondary | ICD-10-CM | POA: Diagnosis not present

## 2022-09-21 DIAGNOSIS — I480 Paroxysmal atrial fibrillation: Secondary | ICD-10-CM | POA: Diagnosis not present

## 2022-09-21 DIAGNOSIS — E0501 Thyrotoxicosis with diffuse goiter with thyrotoxic crisis or storm: Secondary | ICD-10-CM | POA: Diagnosis not present

## 2022-09-21 DIAGNOSIS — J45909 Unspecified asthma, uncomplicated: Secondary | ICD-10-CM | POA: Diagnosis not present

## 2022-09-21 DIAGNOSIS — G9341 Metabolic encephalopathy: Secondary | ICD-10-CM | POA: Diagnosis not present

## 2022-09-21 DIAGNOSIS — K219 Gastro-esophageal reflux disease without esophagitis: Secondary | ICD-10-CM | POA: Diagnosis not present

## 2022-09-22 DIAGNOSIS — A812 Progressive multifocal leukoencephalopathy: Secondary | ICD-10-CM | POA: Diagnosis not present

## 2022-09-22 DIAGNOSIS — F02B3 Dementia in other diseases classified elsewhere, moderate, with mood disturbance: Secondary | ICD-10-CM | POA: Diagnosis not present

## 2022-09-22 DIAGNOSIS — K111 Hypertrophy of salivary gland: Secondary | ICD-10-CM | POA: Diagnosis not present

## 2022-09-22 DIAGNOSIS — G9341 Metabolic encephalopathy: Secondary | ICD-10-CM | POA: Diagnosis not present

## 2022-09-22 DIAGNOSIS — B20 Human immunodeficiency virus [HIV] disease: Secondary | ICD-10-CM | POA: Diagnosis not present

## 2022-09-22 DIAGNOSIS — E0500 Thyrotoxicosis with diffuse goiter without thyrotoxic crisis or storm: Secondary | ICD-10-CM | POA: Diagnosis not present

## 2022-09-22 DIAGNOSIS — I2699 Other pulmonary embolism without acute cor pulmonale: Secondary | ICD-10-CM | POA: Diagnosis not present

## 2022-09-22 DIAGNOSIS — K219 Gastro-esophageal reflux disease without esophagitis: Secondary | ICD-10-CM | POA: Diagnosis not present

## 2022-09-22 DIAGNOSIS — A6000 Herpesviral infection of urogenital system, unspecified: Secondary | ICD-10-CM | POA: Diagnosis not present

## 2022-09-22 DIAGNOSIS — E0501 Thyrotoxicosis with diffuse goiter with thyrotoxic crisis or storm: Secondary | ICD-10-CM | POA: Diagnosis not present

## 2022-09-22 DIAGNOSIS — B003 Herpesviral meningitis: Secondary | ICD-10-CM | POA: Diagnosis not present

## 2022-09-22 DIAGNOSIS — E118 Type 2 diabetes mellitus with unspecified complications: Secondary | ICD-10-CM | POA: Diagnosis not present

## 2022-09-22 DIAGNOSIS — I480 Paroxysmal atrial fibrillation: Secondary | ICD-10-CM | POA: Diagnosis not present

## 2022-09-22 DIAGNOSIS — G8114 Spastic hemiplegia affecting left nondominant side: Secondary | ICD-10-CM | POA: Diagnosis not present

## 2022-09-22 DIAGNOSIS — J45909 Unspecified asthma, uncomplicated: Secondary | ICD-10-CM | POA: Diagnosis not present

## 2022-09-22 DIAGNOSIS — E785 Hyperlipidemia, unspecified: Secondary | ICD-10-CM | POA: Diagnosis not present

## 2022-09-23 DIAGNOSIS — J45909 Unspecified asthma, uncomplicated: Secondary | ICD-10-CM | POA: Diagnosis not present

## 2022-09-23 DIAGNOSIS — K219 Gastro-esophageal reflux disease without esophagitis: Secondary | ICD-10-CM | POA: Diagnosis not present

## 2022-09-23 DIAGNOSIS — A812 Progressive multifocal leukoencephalopathy: Secondary | ICD-10-CM | POA: Diagnosis not present

## 2022-09-23 DIAGNOSIS — I2699 Other pulmonary embolism without acute cor pulmonale: Secondary | ICD-10-CM | POA: Diagnosis not present

## 2022-09-23 DIAGNOSIS — E0500 Thyrotoxicosis with diffuse goiter without thyrotoxic crisis or storm: Secondary | ICD-10-CM | POA: Diagnosis not present

## 2022-09-23 DIAGNOSIS — F02B3 Dementia in other diseases classified elsewhere, moderate, with mood disturbance: Secondary | ICD-10-CM | POA: Diagnosis not present

## 2022-09-23 DIAGNOSIS — B20 Human immunodeficiency virus [HIV] disease: Secondary | ICD-10-CM | POA: Diagnosis not present

## 2022-09-23 DIAGNOSIS — G9341 Metabolic encephalopathy: Secondary | ICD-10-CM | POA: Diagnosis not present

## 2022-09-23 DIAGNOSIS — I480 Paroxysmal atrial fibrillation: Secondary | ICD-10-CM | POA: Diagnosis not present

## 2022-09-23 DIAGNOSIS — B003 Herpesviral meningitis: Secondary | ICD-10-CM | POA: Diagnosis not present

## 2022-09-23 DIAGNOSIS — E0501 Thyrotoxicosis with diffuse goiter with thyrotoxic crisis or storm: Secondary | ICD-10-CM | POA: Diagnosis not present

## 2022-09-23 DIAGNOSIS — E785 Hyperlipidemia, unspecified: Secondary | ICD-10-CM | POA: Diagnosis not present

## 2022-09-23 DIAGNOSIS — K111 Hypertrophy of salivary gland: Secondary | ICD-10-CM | POA: Diagnosis not present

## 2022-09-23 DIAGNOSIS — A6000 Herpesviral infection of urogenital system, unspecified: Secondary | ICD-10-CM | POA: Diagnosis not present

## 2022-09-23 DIAGNOSIS — G8114 Spastic hemiplegia affecting left nondominant side: Secondary | ICD-10-CM | POA: Diagnosis not present

## 2022-09-23 DIAGNOSIS — E118 Type 2 diabetes mellitus with unspecified complications: Secondary | ICD-10-CM | POA: Diagnosis not present

## 2022-09-24 DIAGNOSIS — G8114 Spastic hemiplegia affecting left nondominant side: Secondary | ICD-10-CM | POA: Diagnosis not present

## 2022-09-24 DIAGNOSIS — E118 Type 2 diabetes mellitus with unspecified complications: Secondary | ICD-10-CM | POA: Diagnosis not present

## 2022-09-24 DIAGNOSIS — B20 Human immunodeficiency virus [HIV] disease: Secondary | ICD-10-CM | POA: Diagnosis not present

## 2022-09-24 DIAGNOSIS — I2699 Other pulmonary embolism without acute cor pulmonale: Secondary | ICD-10-CM | POA: Diagnosis not present

## 2022-09-24 DIAGNOSIS — A812 Progressive multifocal leukoencephalopathy: Secondary | ICD-10-CM | POA: Diagnosis not present

## 2022-09-24 DIAGNOSIS — K219 Gastro-esophageal reflux disease without esophagitis: Secondary | ICD-10-CM | POA: Diagnosis not present

## 2022-09-24 DIAGNOSIS — G9341 Metabolic encephalopathy: Secondary | ICD-10-CM | POA: Diagnosis not present

## 2022-09-24 DIAGNOSIS — J45909 Unspecified asthma, uncomplicated: Secondary | ICD-10-CM | POA: Diagnosis not present

## 2022-09-24 DIAGNOSIS — E0501 Thyrotoxicosis with diffuse goiter with thyrotoxic crisis or storm: Secondary | ICD-10-CM | POA: Diagnosis not present

## 2022-09-24 DIAGNOSIS — E785 Hyperlipidemia, unspecified: Secondary | ICD-10-CM | POA: Diagnosis not present

## 2022-09-24 DIAGNOSIS — I480 Paroxysmal atrial fibrillation: Secondary | ICD-10-CM | POA: Diagnosis not present

## 2022-09-24 DIAGNOSIS — E0500 Thyrotoxicosis with diffuse goiter without thyrotoxic crisis or storm: Secondary | ICD-10-CM | POA: Diagnosis not present

## 2022-09-24 DIAGNOSIS — A6000 Herpesviral infection of urogenital system, unspecified: Secondary | ICD-10-CM | POA: Diagnosis not present

## 2022-09-24 DIAGNOSIS — B003 Herpesviral meningitis: Secondary | ICD-10-CM | POA: Diagnosis not present

## 2022-09-24 DIAGNOSIS — F02B3 Dementia in other diseases classified elsewhere, moderate, with mood disturbance: Secondary | ICD-10-CM | POA: Diagnosis not present

## 2022-09-24 DIAGNOSIS — K111 Hypertrophy of salivary gland: Secondary | ICD-10-CM | POA: Diagnosis not present

## 2022-09-25 DIAGNOSIS — E785 Hyperlipidemia, unspecified: Secondary | ICD-10-CM | POA: Diagnosis not present

## 2022-09-25 DIAGNOSIS — B20 Human immunodeficiency virus [HIV] disease: Secondary | ICD-10-CM | POA: Diagnosis not present

## 2022-09-25 DIAGNOSIS — E0501 Thyrotoxicosis with diffuse goiter with thyrotoxic crisis or storm: Secondary | ICD-10-CM | POA: Diagnosis not present

## 2022-09-25 DIAGNOSIS — B003 Herpesviral meningitis: Secondary | ICD-10-CM | POA: Diagnosis not present

## 2022-09-25 DIAGNOSIS — G8114 Spastic hemiplegia affecting left nondominant side: Secondary | ICD-10-CM | POA: Diagnosis not present

## 2022-09-25 DIAGNOSIS — I480 Paroxysmal atrial fibrillation: Secondary | ICD-10-CM | POA: Diagnosis not present

## 2022-09-25 DIAGNOSIS — A6000 Herpesviral infection of urogenital system, unspecified: Secondary | ICD-10-CM | POA: Diagnosis not present

## 2022-09-25 DIAGNOSIS — K219 Gastro-esophageal reflux disease without esophagitis: Secondary | ICD-10-CM | POA: Diagnosis not present

## 2022-09-25 DIAGNOSIS — K111 Hypertrophy of salivary gland: Secondary | ICD-10-CM | POA: Diagnosis not present

## 2022-09-25 DIAGNOSIS — F02B3 Dementia in other diseases classified elsewhere, moderate, with mood disturbance: Secondary | ICD-10-CM | POA: Diagnosis not present

## 2022-09-25 DIAGNOSIS — E0500 Thyrotoxicosis with diffuse goiter without thyrotoxic crisis or storm: Secondary | ICD-10-CM | POA: Diagnosis not present

## 2022-09-25 DIAGNOSIS — G9341 Metabolic encephalopathy: Secondary | ICD-10-CM | POA: Diagnosis not present

## 2022-09-25 DIAGNOSIS — I2699 Other pulmonary embolism without acute cor pulmonale: Secondary | ICD-10-CM | POA: Diagnosis not present

## 2022-09-25 DIAGNOSIS — E118 Type 2 diabetes mellitus with unspecified complications: Secondary | ICD-10-CM | POA: Diagnosis not present

## 2022-09-25 DIAGNOSIS — A812 Progressive multifocal leukoencephalopathy: Secondary | ICD-10-CM | POA: Diagnosis not present

## 2022-09-25 DIAGNOSIS — J45909 Unspecified asthma, uncomplicated: Secondary | ICD-10-CM | POA: Diagnosis not present

## 2022-09-26 DIAGNOSIS — E785 Hyperlipidemia, unspecified: Secondary | ICD-10-CM | POA: Diagnosis not present

## 2022-09-26 DIAGNOSIS — A812 Progressive multifocal leukoencephalopathy: Secondary | ICD-10-CM | POA: Diagnosis not present

## 2022-09-26 DIAGNOSIS — I2699 Other pulmonary embolism without acute cor pulmonale: Secondary | ICD-10-CM | POA: Diagnosis not present

## 2022-09-26 DIAGNOSIS — F02B3 Dementia in other diseases classified elsewhere, moderate, with mood disturbance: Secondary | ICD-10-CM | POA: Diagnosis not present

## 2022-09-26 DIAGNOSIS — A6000 Herpesviral infection of urogenital system, unspecified: Secondary | ICD-10-CM | POA: Diagnosis not present

## 2022-09-26 DIAGNOSIS — G8114 Spastic hemiplegia affecting left nondominant side: Secondary | ICD-10-CM | POA: Diagnosis not present

## 2022-09-26 DIAGNOSIS — J45909 Unspecified asthma, uncomplicated: Secondary | ICD-10-CM | POA: Diagnosis not present

## 2022-09-26 DIAGNOSIS — E0501 Thyrotoxicosis with diffuse goiter with thyrotoxic crisis or storm: Secondary | ICD-10-CM | POA: Diagnosis not present

## 2022-09-26 DIAGNOSIS — B003 Herpesviral meningitis: Secondary | ICD-10-CM | POA: Diagnosis not present

## 2022-09-26 DIAGNOSIS — E0500 Thyrotoxicosis with diffuse goiter without thyrotoxic crisis or storm: Secondary | ICD-10-CM | POA: Diagnosis not present

## 2022-09-26 DIAGNOSIS — G9341 Metabolic encephalopathy: Secondary | ICD-10-CM | POA: Diagnosis not present

## 2022-09-26 DIAGNOSIS — B20 Human immunodeficiency virus [HIV] disease: Secondary | ICD-10-CM | POA: Diagnosis not present

## 2022-09-26 DIAGNOSIS — I480 Paroxysmal atrial fibrillation: Secondary | ICD-10-CM | POA: Diagnosis not present

## 2022-09-26 DIAGNOSIS — E118 Type 2 diabetes mellitus with unspecified complications: Secondary | ICD-10-CM | POA: Diagnosis not present

## 2022-09-26 DIAGNOSIS — K111 Hypertrophy of salivary gland: Secondary | ICD-10-CM | POA: Diagnosis not present

## 2022-09-26 DIAGNOSIS — K219 Gastro-esophageal reflux disease without esophagitis: Secondary | ICD-10-CM | POA: Diagnosis not present

## 2022-09-27 DIAGNOSIS — F02B3 Dementia in other diseases classified elsewhere, moderate, with mood disturbance: Secondary | ICD-10-CM | POA: Diagnosis not present

## 2022-09-27 DIAGNOSIS — I2699 Other pulmonary embolism without acute cor pulmonale: Secondary | ICD-10-CM | POA: Diagnosis not present

## 2022-09-27 DIAGNOSIS — I480 Paroxysmal atrial fibrillation: Secondary | ICD-10-CM | POA: Diagnosis not present

## 2022-09-27 DIAGNOSIS — K219 Gastro-esophageal reflux disease without esophagitis: Secondary | ICD-10-CM | POA: Diagnosis not present

## 2022-09-27 DIAGNOSIS — G9341 Metabolic encephalopathy: Secondary | ICD-10-CM | POA: Diagnosis not present

## 2022-09-27 DIAGNOSIS — B003 Herpesviral meningitis: Secondary | ICD-10-CM | POA: Diagnosis not present

## 2022-09-27 DIAGNOSIS — E0501 Thyrotoxicosis with diffuse goiter with thyrotoxic crisis or storm: Secondary | ICD-10-CM | POA: Diagnosis not present

## 2022-09-27 DIAGNOSIS — A812 Progressive multifocal leukoencephalopathy: Secondary | ICD-10-CM | POA: Diagnosis not present

## 2022-09-27 DIAGNOSIS — G8114 Spastic hemiplegia affecting left nondominant side: Secondary | ICD-10-CM | POA: Diagnosis not present

## 2022-09-27 DIAGNOSIS — K111 Hypertrophy of salivary gland: Secondary | ICD-10-CM | POA: Diagnosis not present

## 2022-09-27 DIAGNOSIS — B20 Human immunodeficiency virus [HIV] disease: Secondary | ICD-10-CM | POA: Diagnosis not present

## 2022-09-27 DIAGNOSIS — E118 Type 2 diabetes mellitus with unspecified complications: Secondary | ICD-10-CM | POA: Diagnosis not present

## 2022-09-27 DIAGNOSIS — A6000 Herpesviral infection of urogenital system, unspecified: Secondary | ICD-10-CM | POA: Diagnosis not present

## 2022-09-27 DIAGNOSIS — E0500 Thyrotoxicosis with diffuse goiter without thyrotoxic crisis or storm: Secondary | ICD-10-CM | POA: Diagnosis not present

## 2022-09-27 DIAGNOSIS — E785 Hyperlipidemia, unspecified: Secondary | ICD-10-CM | POA: Diagnosis not present

## 2022-09-27 DIAGNOSIS — J45909 Unspecified asthma, uncomplicated: Secondary | ICD-10-CM | POA: Diagnosis not present

## 2022-09-28 DIAGNOSIS — I2699 Other pulmonary embolism without acute cor pulmonale: Secondary | ICD-10-CM | POA: Diagnosis not present

## 2022-09-28 DIAGNOSIS — B20 Human immunodeficiency virus [HIV] disease: Secondary | ICD-10-CM | POA: Diagnosis not present

## 2022-09-28 DIAGNOSIS — B003 Herpesviral meningitis: Secondary | ICD-10-CM | POA: Diagnosis not present

## 2022-09-28 DIAGNOSIS — G9341 Metabolic encephalopathy: Secondary | ICD-10-CM | POA: Diagnosis not present

## 2022-09-28 DIAGNOSIS — A812 Progressive multifocal leukoencephalopathy: Secondary | ICD-10-CM | POA: Diagnosis not present

## 2022-09-28 DIAGNOSIS — G8114 Spastic hemiplegia affecting left nondominant side: Secondary | ICD-10-CM | POA: Diagnosis not present

## 2022-09-28 DIAGNOSIS — A6000 Herpesviral infection of urogenital system, unspecified: Secondary | ICD-10-CM | POA: Diagnosis not present

## 2022-09-28 DIAGNOSIS — I480 Paroxysmal atrial fibrillation: Secondary | ICD-10-CM | POA: Diagnosis not present

## 2022-09-28 DIAGNOSIS — J45909 Unspecified asthma, uncomplicated: Secondary | ICD-10-CM | POA: Diagnosis not present

## 2022-09-28 DIAGNOSIS — E0500 Thyrotoxicosis with diffuse goiter without thyrotoxic crisis or storm: Secondary | ICD-10-CM | POA: Diagnosis not present

## 2022-09-28 DIAGNOSIS — K219 Gastro-esophageal reflux disease without esophagitis: Secondary | ICD-10-CM | POA: Diagnosis not present

## 2022-09-28 DIAGNOSIS — E0501 Thyrotoxicosis with diffuse goiter with thyrotoxic crisis or storm: Secondary | ICD-10-CM | POA: Diagnosis not present

## 2022-09-28 DIAGNOSIS — E785 Hyperlipidemia, unspecified: Secondary | ICD-10-CM | POA: Diagnosis not present

## 2022-09-28 DIAGNOSIS — E118 Type 2 diabetes mellitus with unspecified complications: Secondary | ICD-10-CM | POA: Diagnosis not present

## 2022-09-28 DIAGNOSIS — F02B3 Dementia in other diseases classified elsewhere, moderate, with mood disturbance: Secondary | ICD-10-CM | POA: Diagnosis not present

## 2022-09-28 DIAGNOSIS — K111 Hypertrophy of salivary gland: Secondary | ICD-10-CM | POA: Diagnosis not present

## 2022-09-29 DIAGNOSIS — I480 Paroxysmal atrial fibrillation: Secondary | ICD-10-CM | POA: Diagnosis not present

## 2022-09-29 DIAGNOSIS — B20 Human immunodeficiency virus [HIV] disease: Secondary | ICD-10-CM | POA: Diagnosis not present

## 2022-09-29 DIAGNOSIS — K219 Gastro-esophageal reflux disease without esophagitis: Secondary | ICD-10-CM | POA: Diagnosis not present

## 2022-09-29 DIAGNOSIS — E0500 Thyrotoxicosis with diffuse goiter without thyrotoxic crisis or storm: Secondary | ICD-10-CM | POA: Diagnosis not present

## 2022-09-29 DIAGNOSIS — G9341 Metabolic encephalopathy: Secondary | ICD-10-CM | POA: Diagnosis not present

## 2022-09-29 DIAGNOSIS — E0501 Thyrotoxicosis with diffuse goiter with thyrotoxic crisis or storm: Secondary | ICD-10-CM | POA: Diagnosis not present

## 2022-09-29 DIAGNOSIS — A6000 Herpesviral infection of urogenital system, unspecified: Secondary | ICD-10-CM | POA: Diagnosis not present

## 2022-09-29 DIAGNOSIS — J45909 Unspecified asthma, uncomplicated: Secondary | ICD-10-CM | POA: Diagnosis not present

## 2022-09-29 DIAGNOSIS — K111 Hypertrophy of salivary gland: Secondary | ICD-10-CM | POA: Diagnosis not present

## 2022-09-29 DIAGNOSIS — B003 Herpesviral meningitis: Secondary | ICD-10-CM | POA: Diagnosis not present

## 2022-09-29 DIAGNOSIS — I2699 Other pulmonary embolism without acute cor pulmonale: Secondary | ICD-10-CM | POA: Diagnosis not present

## 2022-09-29 DIAGNOSIS — G8114 Spastic hemiplegia affecting left nondominant side: Secondary | ICD-10-CM | POA: Diagnosis not present

## 2022-09-29 DIAGNOSIS — E785 Hyperlipidemia, unspecified: Secondary | ICD-10-CM | POA: Diagnosis not present

## 2022-09-29 DIAGNOSIS — F02B3 Dementia in other diseases classified elsewhere, moderate, with mood disturbance: Secondary | ICD-10-CM | POA: Diagnosis not present

## 2022-09-29 DIAGNOSIS — E118 Type 2 diabetes mellitus with unspecified complications: Secondary | ICD-10-CM | POA: Diagnosis not present

## 2022-09-29 DIAGNOSIS — A812 Progressive multifocal leukoencephalopathy: Secondary | ICD-10-CM | POA: Diagnosis not present

## 2022-09-30 DIAGNOSIS — I2699 Other pulmonary embolism without acute cor pulmonale: Secondary | ICD-10-CM | POA: Diagnosis not present

## 2022-09-30 DIAGNOSIS — A6000 Herpesviral infection of urogenital system, unspecified: Secondary | ICD-10-CM | POA: Diagnosis not present

## 2022-09-30 DIAGNOSIS — B003 Herpesviral meningitis: Secondary | ICD-10-CM | POA: Diagnosis not present

## 2022-09-30 DIAGNOSIS — E785 Hyperlipidemia, unspecified: Secondary | ICD-10-CM | POA: Diagnosis not present

## 2022-09-30 DIAGNOSIS — F02B3 Dementia in other diseases classified elsewhere, moderate, with mood disturbance: Secondary | ICD-10-CM | POA: Diagnosis not present

## 2022-09-30 DIAGNOSIS — E0500 Thyrotoxicosis with diffuse goiter without thyrotoxic crisis or storm: Secondary | ICD-10-CM | POA: Diagnosis not present

## 2022-09-30 DIAGNOSIS — G8114 Spastic hemiplegia affecting left nondominant side: Secondary | ICD-10-CM | POA: Diagnosis not present

## 2022-09-30 DIAGNOSIS — E0501 Thyrotoxicosis with diffuse goiter with thyrotoxic crisis or storm: Secondary | ICD-10-CM | POA: Diagnosis not present

## 2022-09-30 DIAGNOSIS — J45909 Unspecified asthma, uncomplicated: Secondary | ICD-10-CM | POA: Diagnosis not present

## 2022-09-30 DIAGNOSIS — E118 Type 2 diabetes mellitus with unspecified complications: Secondary | ICD-10-CM | POA: Diagnosis not present

## 2022-09-30 DIAGNOSIS — I480 Paroxysmal atrial fibrillation: Secondary | ICD-10-CM | POA: Diagnosis not present

## 2022-09-30 DIAGNOSIS — K219 Gastro-esophageal reflux disease without esophagitis: Secondary | ICD-10-CM | POA: Diagnosis not present

## 2022-09-30 DIAGNOSIS — A812 Progressive multifocal leukoencephalopathy: Secondary | ICD-10-CM | POA: Diagnosis not present

## 2022-09-30 DIAGNOSIS — G9341 Metabolic encephalopathy: Secondary | ICD-10-CM | POA: Diagnosis not present

## 2022-09-30 DIAGNOSIS — B20 Human immunodeficiency virus [HIV] disease: Secondary | ICD-10-CM | POA: Diagnosis not present

## 2022-09-30 DIAGNOSIS — K111 Hypertrophy of salivary gland: Secondary | ICD-10-CM | POA: Diagnosis not present

## 2022-10-01 DIAGNOSIS — K111 Hypertrophy of salivary gland: Secondary | ICD-10-CM | POA: Diagnosis not present

## 2022-10-01 DIAGNOSIS — G8114 Spastic hemiplegia affecting left nondominant side: Secondary | ICD-10-CM | POA: Diagnosis not present

## 2022-10-01 DIAGNOSIS — G9341 Metabolic encephalopathy: Secondary | ICD-10-CM | POA: Diagnosis not present

## 2022-10-01 DIAGNOSIS — E0501 Thyrotoxicosis with diffuse goiter with thyrotoxic crisis or storm: Secondary | ICD-10-CM | POA: Diagnosis not present

## 2022-10-01 DIAGNOSIS — I2699 Other pulmonary embolism without acute cor pulmonale: Secondary | ICD-10-CM | POA: Diagnosis not present

## 2022-10-01 DIAGNOSIS — J45909 Unspecified asthma, uncomplicated: Secondary | ICD-10-CM | POA: Diagnosis not present

## 2022-10-01 DIAGNOSIS — A6000 Herpesviral infection of urogenital system, unspecified: Secondary | ICD-10-CM | POA: Diagnosis not present

## 2022-10-01 DIAGNOSIS — B003 Herpesviral meningitis: Secondary | ICD-10-CM | POA: Diagnosis not present

## 2022-10-01 DIAGNOSIS — I480 Paroxysmal atrial fibrillation: Secondary | ICD-10-CM | POA: Diagnosis not present

## 2022-10-01 DIAGNOSIS — K219 Gastro-esophageal reflux disease without esophagitis: Secondary | ICD-10-CM | POA: Diagnosis not present

## 2022-10-01 DIAGNOSIS — E118 Type 2 diabetes mellitus with unspecified complications: Secondary | ICD-10-CM | POA: Diagnosis not present

## 2022-10-01 DIAGNOSIS — E0500 Thyrotoxicosis with diffuse goiter without thyrotoxic crisis or storm: Secondary | ICD-10-CM | POA: Diagnosis not present

## 2022-10-01 DIAGNOSIS — F02B3 Dementia in other diseases classified elsewhere, moderate, with mood disturbance: Secondary | ICD-10-CM | POA: Diagnosis not present

## 2022-10-01 DIAGNOSIS — E785 Hyperlipidemia, unspecified: Secondary | ICD-10-CM | POA: Diagnosis not present

## 2022-10-01 DIAGNOSIS — B20 Human immunodeficiency virus [HIV] disease: Secondary | ICD-10-CM | POA: Diagnosis not present

## 2022-10-01 DIAGNOSIS — A812 Progressive multifocal leukoencephalopathy: Secondary | ICD-10-CM | POA: Diagnosis not present

## 2022-10-02 DIAGNOSIS — F02B3 Dementia in other diseases classified elsewhere, moderate, with mood disturbance: Secondary | ICD-10-CM | POA: Diagnosis not present

## 2022-10-02 DIAGNOSIS — E0501 Thyrotoxicosis with diffuse goiter with thyrotoxic crisis or storm: Secondary | ICD-10-CM | POA: Diagnosis not present

## 2022-10-02 DIAGNOSIS — B20 Human immunodeficiency virus [HIV] disease: Secondary | ICD-10-CM | POA: Diagnosis not present

## 2022-10-02 DIAGNOSIS — I2699 Other pulmonary embolism without acute cor pulmonale: Secondary | ICD-10-CM | POA: Diagnosis not present

## 2022-10-02 DIAGNOSIS — A6000 Herpesviral infection of urogenital system, unspecified: Secondary | ICD-10-CM | POA: Diagnosis not present

## 2022-10-02 DIAGNOSIS — J45909 Unspecified asthma, uncomplicated: Secondary | ICD-10-CM | POA: Diagnosis not present

## 2022-10-02 DIAGNOSIS — G8114 Spastic hemiplegia affecting left nondominant side: Secondary | ICD-10-CM | POA: Diagnosis not present

## 2022-10-02 DIAGNOSIS — A812 Progressive multifocal leukoencephalopathy: Secondary | ICD-10-CM | POA: Diagnosis not present

## 2022-10-02 DIAGNOSIS — K111 Hypertrophy of salivary gland: Secondary | ICD-10-CM | POA: Diagnosis not present

## 2022-10-02 DIAGNOSIS — E785 Hyperlipidemia, unspecified: Secondary | ICD-10-CM | POA: Diagnosis not present

## 2022-10-02 DIAGNOSIS — G9341 Metabolic encephalopathy: Secondary | ICD-10-CM | POA: Diagnosis not present

## 2022-10-02 DIAGNOSIS — B003 Herpesviral meningitis: Secondary | ICD-10-CM | POA: Diagnosis not present

## 2022-10-02 DIAGNOSIS — K219 Gastro-esophageal reflux disease without esophagitis: Secondary | ICD-10-CM | POA: Diagnosis not present

## 2022-10-02 DIAGNOSIS — E118 Type 2 diabetes mellitus with unspecified complications: Secondary | ICD-10-CM | POA: Diagnosis not present

## 2022-10-02 DIAGNOSIS — I480 Paroxysmal atrial fibrillation: Secondary | ICD-10-CM | POA: Diagnosis not present

## 2022-10-02 DIAGNOSIS — E0500 Thyrotoxicosis with diffuse goiter without thyrotoxic crisis or storm: Secondary | ICD-10-CM | POA: Diagnosis not present

## 2022-10-03 DIAGNOSIS — K111 Hypertrophy of salivary gland: Secondary | ICD-10-CM | POA: Diagnosis not present

## 2022-10-03 DIAGNOSIS — F02B3 Dementia in other diseases classified elsewhere, moderate, with mood disturbance: Secondary | ICD-10-CM | POA: Diagnosis not present

## 2022-10-03 DIAGNOSIS — E0500 Thyrotoxicosis with diffuse goiter without thyrotoxic crisis or storm: Secondary | ICD-10-CM | POA: Diagnosis not present

## 2022-10-03 DIAGNOSIS — B003 Herpesviral meningitis: Secondary | ICD-10-CM | POA: Diagnosis not present

## 2022-10-03 DIAGNOSIS — E118 Type 2 diabetes mellitus with unspecified complications: Secondary | ICD-10-CM | POA: Diagnosis not present

## 2022-10-03 DIAGNOSIS — E785 Hyperlipidemia, unspecified: Secondary | ICD-10-CM | POA: Diagnosis not present

## 2022-10-03 DIAGNOSIS — B20 Human immunodeficiency virus [HIV] disease: Secondary | ICD-10-CM | POA: Diagnosis not present

## 2022-10-03 DIAGNOSIS — K219 Gastro-esophageal reflux disease without esophagitis: Secondary | ICD-10-CM | POA: Diagnosis not present

## 2022-10-03 DIAGNOSIS — I2699 Other pulmonary embolism without acute cor pulmonale: Secondary | ICD-10-CM | POA: Diagnosis not present

## 2022-10-03 DIAGNOSIS — J45909 Unspecified asthma, uncomplicated: Secondary | ICD-10-CM | POA: Diagnosis not present

## 2022-10-03 DIAGNOSIS — G9341 Metabolic encephalopathy: Secondary | ICD-10-CM | POA: Diagnosis not present

## 2022-10-03 DIAGNOSIS — A6000 Herpesviral infection of urogenital system, unspecified: Secondary | ICD-10-CM | POA: Diagnosis not present

## 2022-10-03 DIAGNOSIS — G8114 Spastic hemiplegia affecting left nondominant side: Secondary | ICD-10-CM | POA: Diagnosis not present

## 2022-10-03 DIAGNOSIS — A812 Progressive multifocal leukoencephalopathy: Secondary | ICD-10-CM | POA: Diagnosis not present

## 2022-10-03 DIAGNOSIS — I480 Paroxysmal atrial fibrillation: Secondary | ICD-10-CM | POA: Diagnosis not present

## 2022-10-03 DIAGNOSIS — E0501 Thyrotoxicosis with diffuse goiter with thyrotoxic crisis or storm: Secondary | ICD-10-CM | POA: Diagnosis not present

## 2022-10-04 DIAGNOSIS — G8114 Spastic hemiplegia affecting left nondominant side: Secondary | ICD-10-CM | POA: Diagnosis not present

## 2022-10-04 DIAGNOSIS — K219 Gastro-esophageal reflux disease without esophagitis: Secondary | ICD-10-CM | POA: Diagnosis not present

## 2022-10-04 DIAGNOSIS — E0501 Thyrotoxicosis with diffuse goiter with thyrotoxic crisis or storm: Secondary | ICD-10-CM | POA: Diagnosis not present

## 2022-10-04 DIAGNOSIS — I2699 Other pulmonary embolism without acute cor pulmonale: Secondary | ICD-10-CM | POA: Diagnosis not present

## 2022-10-04 DIAGNOSIS — E785 Hyperlipidemia, unspecified: Secondary | ICD-10-CM | POA: Diagnosis not present

## 2022-10-04 DIAGNOSIS — A6000 Herpesviral infection of urogenital system, unspecified: Secondary | ICD-10-CM | POA: Diagnosis not present

## 2022-10-04 DIAGNOSIS — A812 Progressive multifocal leukoencephalopathy: Secondary | ICD-10-CM | POA: Diagnosis not present

## 2022-10-04 DIAGNOSIS — J45909 Unspecified asthma, uncomplicated: Secondary | ICD-10-CM | POA: Diagnosis not present

## 2022-10-04 DIAGNOSIS — B20 Human immunodeficiency virus [HIV] disease: Secondary | ICD-10-CM | POA: Diagnosis not present

## 2022-10-04 DIAGNOSIS — E118 Type 2 diabetes mellitus with unspecified complications: Secondary | ICD-10-CM | POA: Diagnosis not present

## 2022-10-04 DIAGNOSIS — I480 Paroxysmal atrial fibrillation: Secondary | ICD-10-CM | POA: Diagnosis not present

## 2022-10-04 DIAGNOSIS — K111 Hypertrophy of salivary gland: Secondary | ICD-10-CM | POA: Diagnosis not present

## 2022-10-04 DIAGNOSIS — G9341 Metabolic encephalopathy: Secondary | ICD-10-CM | POA: Diagnosis not present

## 2022-10-04 DIAGNOSIS — E0500 Thyrotoxicosis with diffuse goiter without thyrotoxic crisis or storm: Secondary | ICD-10-CM | POA: Diagnosis not present

## 2022-10-04 DIAGNOSIS — F02B3 Dementia in other diseases classified elsewhere, moderate, with mood disturbance: Secondary | ICD-10-CM | POA: Diagnosis not present

## 2022-10-04 DIAGNOSIS — B003 Herpesviral meningitis: Secondary | ICD-10-CM | POA: Diagnosis not present

## 2022-10-05 DIAGNOSIS — K219 Gastro-esophageal reflux disease without esophagitis: Secondary | ICD-10-CM | POA: Diagnosis not present

## 2022-10-05 DIAGNOSIS — K111 Hypertrophy of salivary gland: Secondary | ICD-10-CM | POA: Diagnosis not present

## 2022-10-05 DIAGNOSIS — B20 Human immunodeficiency virus [HIV] disease: Secondary | ICD-10-CM | POA: Diagnosis not present

## 2022-10-05 DIAGNOSIS — G8114 Spastic hemiplegia affecting left nondominant side: Secondary | ICD-10-CM | POA: Diagnosis not present

## 2022-10-05 DIAGNOSIS — E785 Hyperlipidemia, unspecified: Secondary | ICD-10-CM | POA: Diagnosis not present

## 2022-10-05 DIAGNOSIS — E0501 Thyrotoxicosis with diffuse goiter with thyrotoxic crisis or storm: Secondary | ICD-10-CM | POA: Diagnosis not present

## 2022-10-05 DIAGNOSIS — F02B3 Dementia in other diseases classified elsewhere, moderate, with mood disturbance: Secondary | ICD-10-CM | POA: Diagnosis not present

## 2022-10-05 DIAGNOSIS — J45909 Unspecified asthma, uncomplicated: Secondary | ICD-10-CM | POA: Diagnosis not present

## 2022-10-05 DIAGNOSIS — E0500 Thyrotoxicosis with diffuse goiter without thyrotoxic crisis or storm: Secondary | ICD-10-CM | POA: Diagnosis not present

## 2022-10-05 DIAGNOSIS — A812 Progressive multifocal leukoencephalopathy: Secondary | ICD-10-CM | POA: Diagnosis not present

## 2022-10-05 DIAGNOSIS — B003 Herpesviral meningitis: Secondary | ICD-10-CM | POA: Diagnosis not present

## 2022-10-05 DIAGNOSIS — E118 Type 2 diabetes mellitus with unspecified complications: Secondary | ICD-10-CM | POA: Diagnosis not present

## 2022-10-05 DIAGNOSIS — A6000 Herpesviral infection of urogenital system, unspecified: Secondary | ICD-10-CM | POA: Diagnosis not present

## 2022-10-05 DIAGNOSIS — G9341 Metabolic encephalopathy: Secondary | ICD-10-CM | POA: Diagnosis not present

## 2022-10-05 DIAGNOSIS — I2699 Other pulmonary embolism without acute cor pulmonale: Secondary | ICD-10-CM | POA: Diagnosis not present

## 2022-10-05 DIAGNOSIS — I480 Paroxysmal atrial fibrillation: Secondary | ICD-10-CM | POA: Diagnosis not present

## 2022-10-06 DIAGNOSIS — K111 Hypertrophy of salivary gland: Secondary | ICD-10-CM | POA: Diagnosis not present

## 2022-10-06 DIAGNOSIS — B20 Human immunodeficiency virus [HIV] disease: Secondary | ICD-10-CM | POA: Diagnosis not present

## 2022-10-06 DIAGNOSIS — A812 Progressive multifocal leukoencephalopathy: Secondary | ICD-10-CM | POA: Diagnosis not present

## 2022-10-06 DIAGNOSIS — G8114 Spastic hemiplegia affecting left nondominant side: Secondary | ICD-10-CM | POA: Diagnosis not present

## 2022-10-06 DIAGNOSIS — K219 Gastro-esophageal reflux disease without esophagitis: Secondary | ICD-10-CM | POA: Diagnosis not present

## 2022-10-06 DIAGNOSIS — G9341 Metabolic encephalopathy: Secondary | ICD-10-CM | POA: Diagnosis not present

## 2022-10-06 DIAGNOSIS — I2699 Other pulmonary embolism without acute cor pulmonale: Secondary | ICD-10-CM | POA: Diagnosis not present

## 2022-10-06 DIAGNOSIS — E785 Hyperlipidemia, unspecified: Secondary | ICD-10-CM | POA: Diagnosis not present

## 2022-10-06 DIAGNOSIS — E118 Type 2 diabetes mellitus with unspecified complications: Secondary | ICD-10-CM | POA: Diagnosis not present

## 2022-10-06 DIAGNOSIS — I480 Paroxysmal atrial fibrillation: Secondary | ICD-10-CM | POA: Diagnosis not present

## 2022-10-06 DIAGNOSIS — A6000 Herpesviral infection of urogenital system, unspecified: Secondary | ICD-10-CM | POA: Diagnosis not present

## 2022-10-06 DIAGNOSIS — F02B3 Dementia in other diseases classified elsewhere, moderate, with mood disturbance: Secondary | ICD-10-CM | POA: Diagnosis not present

## 2022-10-06 DIAGNOSIS — B003 Herpesviral meningitis: Secondary | ICD-10-CM | POA: Diagnosis not present

## 2022-10-06 DIAGNOSIS — E0501 Thyrotoxicosis with diffuse goiter with thyrotoxic crisis or storm: Secondary | ICD-10-CM | POA: Diagnosis not present

## 2022-10-06 DIAGNOSIS — E0500 Thyrotoxicosis with diffuse goiter without thyrotoxic crisis or storm: Secondary | ICD-10-CM | POA: Diagnosis not present

## 2022-10-06 DIAGNOSIS — J45909 Unspecified asthma, uncomplicated: Secondary | ICD-10-CM | POA: Diagnosis not present

## 2022-10-07 DIAGNOSIS — B003 Herpesviral meningitis: Secondary | ICD-10-CM | POA: Diagnosis not present

## 2022-10-07 DIAGNOSIS — J45909 Unspecified asthma, uncomplicated: Secondary | ICD-10-CM | POA: Diagnosis not present

## 2022-10-07 DIAGNOSIS — G8114 Spastic hemiplegia affecting left nondominant side: Secondary | ICD-10-CM | POA: Diagnosis not present

## 2022-10-07 DIAGNOSIS — E785 Hyperlipidemia, unspecified: Secondary | ICD-10-CM | POA: Diagnosis not present

## 2022-10-07 DIAGNOSIS — I480 Paroxysmal atrial fibrillation: Secondary | ICD-10-CM | POA: Diagnosis not present

## 2022-10-07 DIAGNOSIS — E0500 Thyrotoxicosis with diffuse goiter without thyrotoxic crisis or storm: Secondary | ICD-10-CM | POA: Diagnosis not present

## 2022-10-07 DIAGNOSIS — E0501 Thyrotoxicosis with diffuse goiter with thyrotoxic crisis or storm: Secondary | ICD-10-CM | POA: Diagnosis not present

## 2022-10-07 DIAGNOSIS — K219 Gastro-esophageal reflux disease without esophagitis: Secondary | ICD-10-CM | POA: Diagnosis not present

## 2022-10-07 DIAGNOSIS — B20 Human immunodeficiency virus [HIV] disease: Secondary | ICD-10-CM | POA: Diagnosis not present

## 2022-10-07 DIAGNOSIS — K111 Hypertrophy of salivary gland: Secondary | ICD-10-CM | POA: Diagnosis not present

## 2022-10-07 DIAGNOSIS — A6000 Herpesviral infection of urogenital system, unspecified: Secondary | ICD-10-CM | POA: Diagnosis not present

## 2022-10-07 DIAGNOSIS — E118 Type 2 diabetes mellitus with unspecified complications: Secondary | ICD-10-CM | POA: Diagnosis not present

## 2022-10-07 DIAGNOSIS — G9341 Metabolic encephalopathy: Secondary | ICD-10-CM | POA: Diagnosis not present

## 2022-10-07 DIAGNOSIS — F02B3 Dementia in other diseases classified elsewhere, moderate, with mood disturbance: Secondary | ICD-10-CM | POA: Diagnosis not present

## 2022-10-07 DIAGNOSIS — A812 Progressive multifocal leukoencephalopathy: Secondary | ICD-10-CM | POA: Diagnosis not present

## 2022-10-07 DIAGNOSIS — I2699 Other pulmonary embolism without acute cor pulmonale: Secondary | ICD-10-CM | POA: Diagnosis not present

## 2022-10-08 DIAGNOSIS — A6000 Herpesviral infection of urogenital system, unspecified: Secondary | ICD-10-CM | POA: Diagnosis not present

## 2022-10-08 DIAGNOSIS — I2699 Other pulmonary embolism without acute cor pulmonale: Secondary | ICD-10-CM | POA: Diagnosis not present

## 2022-10-08 DIAGNOSIS — E0500 Thyrotoxicosis with diffuse goiter without thyrotoxic crisis or storm: Secondary | ICD-10-CM | POA: Diagnosis not present

## 2022-10-08 DIAGNOSIS — E0501 Thyrotoxicosis with diffuse goiter with thyrotoxic crisis or storm: Secondary | ICD-10-CM | POA: Diagnosis not present

## 2022-10-08 DIAGNOSIS — K111 Hypertrophy of salivary gland: Secondary | ICD-10-CM | POA: Diagnosis not present

## 2022-10-08 DIAGNOSIS — A812 Progressive multifocal leukoencephalopathy: Secondary | ICD-10-CM | POA: Diagnosis not present

## 2022-10-08 DIAGNOSIS — G8114 Spastic hemiplegia affecting left nondominant side: Secondary | ICD-10-CM | POA: Diagnosis not present

## 2022-10-08 DIAGNOSIS — E118 Type 2 diabetes mellitus with unspecified complications: Secondary | ICD-10-CM | POA: Diagnosis not present

## 2022-10-08 DIAGNOSIS — Z419 Encounter for procedure for purposes other than remedying health state, unspecified: Secondary | ICD-10-CM | POA: Diagnosis not present

## 2022-10-08 DIAGNOSIS — F02B3 Dementia in other diseases classified elsewhere, moderate, with mood disturbance: Secondary | ICD-10-CM | POA: Diagnosis not present

## 2022-10-08 DIAGNOSIS — B003 Herpesviral meningitis: Secondary | ICD-10-CM | POA: Diagnosis not present

## 2022-10-08 DIAGNOSIS — K219 Gastro-esophageal reflux disease without esophagitis: Secondary | ICD-10-CM | POA: Diagnosis not present

## 2022-10-08 DIAGNOSIS — I480 Paroxysmal atrial fibrillation: Secondary | ICD-10-CM | POA: Diagnosis not present

## 2022-10-08 DIAGNOSIS — B20 Human immunodeficiency virus [HIV] disease: Secondary | ICD-10-CM | POA: Diagnosis not present

## 2022-10-08 DIAGNOSIS — E785 Hyperlipidemia, unspecified: Secondary | ICD-10-CM | POA: Diagnosis not present

## 2022-10-08 DIAGNOSIS — G9341 Metabolic encephalopathy: Secondary | ICD-10-CM | POA: Diagnosis not present

## 2022-10-08 DIAGNOSIS — J45909 Unspecified asthma, uncomplicated: Secondary | ICD-10-CM | POA: Diagnosis not present

## 2022-10-09 DIAGNOSIS — B003 Herpesviral meningitis: Secondary | ICD-10-CM | POA: Diagnosis not present

## 2022-10-09 DIAGNOSIS — A812 Progressive multifocal leukoencephalopathy: Secondary | ICD-10-CM | POA: Diagnosis not present

## 2022-10-09 DIAGNOSIS — I2699 Other pulmonary embolism without acute cor pulmonale: Secondary | ICD-10-CM | POA: Diagnosis not present

## 2022-10-09 DIAGNOSIS — K111 Hypertrophy of salivary gland: Secondary | ICD-10-CM | POA: Diagnosis not present

## 2022-10-09 DIAGNOSIS — A6000 Herpesviral infection of urogenital system, unspecified: Secondary | ICD-10-CM | POA: Diagnosis not present

## 2022-10-09 DIAGNOSIS — G8114 Spastic hemiplegia affecting left nondominant side: Secondary | ICD-10-CM | POA: Diagnosis not present

## 2022-10-09 DIAGNOSIS — G9341 Metabolic encephalopathy: Secondary | ICD-10-CM | POA: Diagnosis not present

## 2022-10-09 DIAGNOSIS — I480 Paroxysmal atrial fibrillation: Secondary | ICD-10-CM | POA: Diagnosis not present

## 2022-10-09 DIAGNOSIS — E118 Type 2 diabetes mellitus with unspecified complications: Secondary | ICD-10-CM | POA: Diagnosis not present

## 2022-10-09 DIAGNOSIS — E0500 Thyrotoxicosis with diffuse goiter without thyrotoxic crisis or storm: Secondary | ICD-10-CM | POA: Diagnosis not present

## 2022-10-09 DIAGNOSIS — K219 Gastro-esophageal reflux disease without esophagitis: Secondary | ICD-10-CM | POA: Diagnosis not present

## 2022-10-09 DIAGNOSIS — B20 Human immunodeficiency virus [HIV] disease: Secondary | ICD-10-CM | POA: Diagnosis not present

## 2022-10-09 DIAGNOSIS — E0501 Thyrotoxicosis with diffuse goiter with thyrotoxic crisis or storm: Secondary | ICD-10-CM | POA: Diagnosis not present

## 2022-10-09 DIAGNOSIS — F02B3 Dementia in other diseases classified elsewhere, moderate, with mood disturbance: Secondary | ICD-10-CM | POA: Diagnosis not present

## 2022-10-09 DIAGNOSIS — E785 Hyperlipidemia, unspecified: Secondary | ICD-10-CM | POA: Diagnosis not present

## 2022-10-09 DIAGNOSIS — J45909 Unspecified asthma, uncomplicated: Secondary | ICD-10-CM | POA: Diagnosis not present

## 2022-10-10 DIAGNOSIS — A6000 Herpesviral infection of urogenital system, unspecified: Secondary | ICD-10-CM | POA: Diagnosis not present

## 2022-10-10 DIAGNOSIS — E118 Type 2 diabetes mellitus with unspecified complications: Secondary | ICD-10-CM | POA: Diagnosis not present

## 2022-10-10 DIAGNOSIS — B20 Human immunodeficiency virus [HIV] disease: Secondary | ICD-10-CM | POA: Diagnosis not present

## 2022-10-10 DIAGNOSIS — F02B3 Dementia in other diseases classified elsewhere, moderate, with mood disturbance: Secondary | ICD-10-CM | POA: Diagnosis not present

## 2022-10-10 DIAGNOSIS — E785 Hyperlipidemia, unspecified: Secondary | ICD-10-CM | POA: Diagnosis not present

## 2022-10-10 DIAGNOSIS — J45909 Unspecified asthma, uncomplicated: Secondary | ICD-10-CM | POA: Diagnosis not present

## 2022-10-10 DIAGNOSIS — K111 Hypertrophy of salivary gland: Secondary | ICD-10-CM | POA: Diagnosis not present

## 2022-10-10 DIAGNOSIS — A812 Progressive multifocal leukoencephalopathy: Secondary | ICD-10-CM | POA: Diagnosis not present

## 2022-10-10 DIAGNOSIS — I480 Paroxysmal atrial fibrillation: Secondary | ICD-10-CM | POA: Diagnosis not present

## 2022-10-10 DIAGNOSIS — E0501 Thyrotoxicosis with diffuse goiter with thyrotoxic crisis or storm: Secondary | ICD-10-CM | POA: Diagnosis not present

## 2022-10-10 DIAGNOSIS — G8114 Spastic hemiplegia affecting left nondominant side: Secondary | ICD-10-CM | POA: Diagnosis not present

## 2022-10-10 DIAGNOSIS — E0500 Thyrotoxicosis with diffuse goiter without thyrotoxic crisis or storm: Secondary | ICD-10-CM | POA: Diagnosis not present

## 2022-10-10 DIAGNOSIS — B003 Herpesviral meningitis: Secondary | ICD-10-CM | POA: Diagnosis not present

## 2022-10-10 DIAGNOSIS — G9341 Metabolic encephalopathy: Secondary | ICD-10-CM | POA: Diagnosis not present

## 2022-10-10 DIAGNOSIS — K219 Gastro-esophageal reflux disease without esophagitis: Secondary | ICD-10-CM | POA: Diagnosis not present

## 2022-10-10 DIAGNOSIS — I2699 Other pulmonary embolism without acute cor pulmonale: Secondary | ICD-10-CM | POA: Diagnosis not present

## 2022-10-11 DIAGNOSIS — G8114 Spastic hemiplegia affecting left nondominant side: Secondary | ICD-10-CM | POA: Diagnosis not present

## 2022-10-11 DIAGNOSIS — I480 Paroxysmal atrial fibrillation: Secondary | ICD-10-CM | POA: Diagnosis not present

## 2022-10-11 DIAGNOSIS — F02B3 Dementia in other diseases classified elsewhere, moderate, with mood disturbance: Secondary | ICD-10-CM | POA: Diagnosis not present

## 2022-10-11 DIAGNOSIS — B20 Human immunodeficiency virus [HIV] disease: Secondary | ICD-10-CM | POA: Diagnosis not present

## 2022-10-11 DIAGNOSIS — K219 Gastro-esophageal reflux disease without esophagitis: Secondary | ICD-10-CM | POA: Diagnosis not present

## 2022-10-11 DIAGNOSIS — B003 Herpesviral meningitis: Secondary | ICD-10-CM | POA: Diagnosis not present

## 2022-10-11 DIAGNOSIS — E0501 Thyrotoxicosis with diffuse goiter with thyrotoxic crisis or storm: Secondary | ICD-10-CM | POA: Diagnosis not present

## 2022-10-11 DIAGNOSIS — E0500 Thyrotoxicosis with diffuse goiter without thyrotoxic crisis or storm: Secondary | ICD-10-CM | POA: Diagnosis not present

## 2022-10-11 DIAGNOSIS — I2699 Other pulmonary embolism without acute cor pulmonale: Secondary | ICD-10-CM | POA: Diagnosis not present

## 2022-10-11 DIAGNOSIS — G9341 Metabolic encephalopathy: Secondary | ICD-10-CM | POA: Diagnosis not present

## 2022-10-11 DIAGNOSIS — A6000 Herpesviral infection of urogenital system, unspecified: Secondary | ICD-10-CM | POA: Diagnosis not present

## 2022-10-11 DIAGNOSIS — E118 Type 2 diabetes mellitus with unspecified complications: Secondary | ICD-10-CM | POA: Diagnosis not present

## 2022-10-11 DIAGNOSIS — K111 Hypertrophy of salivary gland: Secondary | ICD-10-CM | POA: Diagnosis not present

## 2022-10-11 DIAGNOSIS — J45909 Unspecified asthma, uncomplicated: Secondary | ICD-10-CM | POA: Diagnosis not present

## 2022-10-11 DIAGNOSIS — E785 Hyperlipidemia, unspecified: Secondary | ICD-10-CM | POA: Diagnosis not present

## 2022-10-11 DIAGNOSIS — A812 Progressive multifocal leukoencephalopathy: Secondary | ICD-10-CM | POA: Diagnosis not present

## 2022-10-12 DIAGNOSIS — E785 Hyperlipidemia, unspecified: Secondary | ICD-10-CM | POA: Diagnosis not present

## 2022-10-12 DIAGNOSIS — B003 Herpesviral meningitis: Secondary | ICD-10-CM | POA: Diagnosis not present

## 2022-10-12 DIAGNOSIS — I2699 Other pulmonary embolism without acute cor pulmonale: Secondary | ICD-10-CM | POA: Diagnosis not present

## 2022-10-12 DIAGNOSIS — I480 Paroxysmal atrial fibrillation: Secondary | ICD-10-CM | POA: Diagnosis not present

## 2022-10-12 DIAGNOSIS — E0501 Thyrotoxicosis with diffuse goiter with thyrotoxic crisis or storm: Secondary | ICD-10-CM | POA: Diagnosis not present

## 2022-10-12 DIAGNOSIS — G9341 Metabolic encephalopathy: Secondary | ICD-10-CM | POA: Diagnosis not present

## 2022-10-12 DIAGNOSIS — F02B3 Dementia in other diseases classified elsewhere, moderate, with mood disturbance: Secondary | ICD-10-CM | POA: Diagnosis not present

## 2022-10-12 DIAGNOSIS — A812 Progressive multifocal leukoencephalopathy: Secondary | ICD-10-CM | POA: Diagnosis not present

## 2022-10-12 DIAGNOSIS — K219 Gastro-esophageal reflux disease without esophagitis: Secondary | ICD-10-CM | POA: Diagnosis not present

## 2022-10-12 DIAGNOSIS — E0500 Thyrotoxicosis with diffuse goiter without thyrotoxic crisis or storm: Secondary | ICD-10-CM | POA: Diagnosis not present

## 2022-10-12 DIAGNOSIS — K111 Hypertrophy of salivary gland: Secondary | ICD-10-CM | POA: Diagnosis not present

## 2022-10-12 DIAGNOSIS — J45909 Unspecified asthma, uncomplicated: Secondary | ICD-10-CM | POA: Diagnosis not present

## 2022-10-12 DIAGNOSIS — E118 Type 2 diabetes mellitus with unspecified complications: Secondary | ICD-10-CM | POA: Diagnosis not present

## 2022-10-12 DIAGNOSIS — A6000 Herpesviral infection of urogenital system, unspecified: Secondary | ICD-10-CM | POA: Diagnosis not present

## 2022-10-12 DIAGNOSIS — B20 Human immunodeficiency virus [HIV] disease: Secondary | ICD-10-CM | POA: Diagnosis not present

## 2022-10-12 DIAGNOSIS — G8114 Spastic hemiplegia affecting left nondominant side: Secondary | ICD-10-CM | POA: Diagnosis not present

## 2022-10-13 DIAGNOSIS — E118 Type 2 diabetes mellitus with unspecified complications: Secondary | ICD-10-CM | POA: Diagnosis not present

## 2022-10-13 DIAGNOSIS — G9341 Metabolic encephalopathy: Secondary | ICD-10-CM | POA: Diagnosis not present

## 2022-10-13 DIAGNOSIS — B003 Herpesviral meningitis: Secondary | ICD-10-CM | POA: Diagnosis not present

## 2022-10-13 DIAGNOSIS — E785 Hyperlipidemia, unspecified: Secondary | ICD-10-CM | POA: Diagnosis not present

## 2022-10-13 DIAGNOSIS — E0501 Thyrotoxicosis with diffuse goiter with thyrotoxic crisis or storm: Secondary | ICD-10-CM | POA: Diagnosis not present

## 2022-10-13 DIAGNOSIS — J45909 Unspecified asthma, uncomplicated: Secondary | ICD-10-CM | POA: Diagnosis not present

## 2022-10-13 DIAGNOSIS — K111 Hypertrophy of salivary gland: Secondary | ICD-10-CM | POA: Diagnosis not present

## 2022-10-13 DIAGNOSIS — G8114 Spastic hemiplegia affecting left nondominant side: Secondary | ICD-10-CM | POA: Diagnosis not present

## 2022-10-13 DIAGNOSIS — F02B3 Dementia in other diseases classified elsewhere, moderate, with mood disturbance: Secondary | ICD-10-CM | POA: Diagnosis not present

## 2022-10-13 DIAGNOSIS — K219 Gastro-esophageal reflux disease without esophagitis: Secondary | ICD-10-CM | POA: Diagnosis not present

## 2022-10-13 DIAGNOSIS — I2699 Other pulmonary embolism without acute cor pulmonale: Secondary | ICD-10-CM | POA: Diagnosis not present

## 2022-10-13 DIAGNOSIS — A6000 Herpesviral infection of urogenital system, unspecified: Secondary | ICD-10-CM | POA: Diagnosis not present

## 2022-10-13 DIAGNOSIS — B20 Human immunodeficiency virus [HIV] disease: Secondary | ICD-10-CM | POA: Diagnosis not present

## 2022-10-13 DIAGNOSIS — E0500 Thyrotoxicosis with diffuse goiter without thyrotoxic crisis or storm: Secondary | ICD-10-CM | POA: Diagnosis not present

## 2022-10-13 DIAGNOSIS — A812 Progressive multifocal leukoencephalopathy: Secondary | ICD-10-CM | POA: Diagnosis not present

## 2022-10-13 DIAGNOSIS — I480 Paroxysmal atrial fibrillation: Secondary | ICD-10-CM | POA: Diagnosis not present

## 2022-10-14 DIAGNOSIS — I2699 Other pulmonary embolism without acute cor pulmonale: Secondary | ICD-10-CM | POA: Diagnosis not present

## 2022-10-14 DIAGNOSIS — E0501 Thyrotoxicosis with diffuse goiter with thyrotoxic crisis or storm: Secondary | ICD-10-CM | POA: Diagnosis not present

## 2022-10-14 DIAGNOSIS — J45909 Unspecified asthma, uncomplicated: Secondary | ICD-10-CM | POA: Diagnosis not present

## 2022-10-14 DIAGNOSIS — E0500 Thyrotoxicosis with diffuse goiter without thyrotoxic crisis or storm: Secondary | ICD-10-CM | POA: Diagnosis not present

## 2022-10-14 DIAGNOSIS — K111 Hypertrophy of salivary gland: Secondary | ICD-10-CM | POA: Diagnosis not present

## 2022-10-14 DIAGNOSIS — F02B3 Dementia in other diseases classified elsewhere, moderate, with mood disturbance: Secondary | ICD-10-CM | POA: Diagnosis not present

## 2022-10-14 DIAGNOSIS — G8114 Spastic hemiplegia affecting left nondominant side: Secondary | ICD-10-CM | POA: Diagnosis not present

## 2022-10-14 DIAGNOSIS — G4733 Obstructive sleep apnea (adult) (pediatric): Secondary | ICD-10-CM | POA: Diagnosis not present

## 2022-10-14 DIAGNOSIS — A812 Progressive multifocal leukoencephalopathy: Secondary | ICD-10-CM | POA: Diagnosis not present

## 2022-10-14 DIAGNOSIS — I480 Paroxysmal atrial fibrillation: Secondary | ICD-10-CM | POA: Diagnosis not present

## 2022-10-14 DIAGNOSIS — A6000 Herpesviral infection of urogenital system, unspecified: Secondary | ICD-10-CM | POA: Diagnosis not present

## 2022-10-14 DIAGNOSIS — B003 Herpesviral meningitis: Secondary | ICD-10-CM | POA: Diagnosis not present

## 2022-10-14 DIAGNOSIS — E785 Hyperlipidemia, unspecified: Secondary | ICD-10-CM | POA: Diagnosis not present

## 2022-10-14 DIAGNOSIS — E118 Type 2 diabetes mellitus with unspecified complications: Secondary | ICD-10-CM | POA: Diagnosis not present

## 2022-10-14 DIAGNOSIS — K219 Gastro-esophageal reflux disease without esophagitis: Secondary | ICD-10-CM | POA: Diagnosis not present

## 2022-10-14 DIAGNOSIS — B20 Human immunodeficiency virus [HIV] disease: Secondary | ICD-10-CM | POA: Diagnosis not present

## 2022-10-14 DIAGNOSIS — G9341 Metabolic encephalopathy: Secondary | ICD-10-CM | POA: Diagnosis not present

## 2022-10-15 DIAGNOSIS — E118 Type 2 diabetes mellitus with unspecified complications: Secondary | ICD-10-CM | POA: Diagnosis not present

## 2022-10-15 DIAGNOSIS — B003 Herpesviral meningitis: Secondary | ICD-10-CM | POA: Diagnosis not present

## 2022-10-15 DIAGNOSIS — I2699 Other pulmonary embolism without acute cor pulmonale: Secondary | ICD-10-CM | POA: Diagnosis not present

## 2022-10-15 DIAGNOSIS — G9341 Metabolic encephalopathy: Secondary | ICD-10-CM | POA: Diagnosis not present

## 2022-10-15 DIAGNOSIS — E0501 Thyrotoxicosis with diffuse goiter with thyrotoxic crisis or storm: Secondary | ICD-10-CM | POA: Diagnosis not present

## 2022-10-15 DIAGNOSIS — K219 Gastro-esophageal reflux disease without esophagitis: Secondary | ICD-10-CM | POA: Diagnosis not present

## 2022-10-15 DIAGNOSIS — K111 Hypertrophy of salivary gland: Secondary | ICD-10-CM | POA: Diagnosis not present

## 2022-10-15 DIAGNOSIS — A6000 Herpesviral infection of urogenital system, unspecified: Secondary | ICD-10-CM | POA: Diagnosis not present

## 2022-10-15 DIAGNOSIS — E0500 Thyrotoxicosis with diffuse goiter without thyrotoxic crisis or storm: Secondary | ICD-10-CM | POA: Diagnosis not present

## 2022-10-15 DIAGNOSIS — F02B3 Dementia in other diseases classified elsewhere, moderate, with mood disturbance: Secondary | ICD-10-CM | POA: Diagnosis not present

## 2022-10-15 DIAGNOSIS — A812 Progressive multifocal leukoencephalopathy: Secondary | ICD-10-CM | POA: Diagnosis not present

## 2022-10-15 DIAGNOSIS — J45909 Unspecified asthma, uncomplicated: Secondary | ICD-10-CM | POA: Diagnosis not present

## 2022-10-15 DIAGNOSIS — B20 Human immunodeficiency virus [HIV] disease: Secondary | ICD-10-CM | POA: Diagnosis not present

## 2022-10-15 DIAGNOSIS — G8114 Spastic hemiplegia affecting left nondominant side: Secondary | ICD-10-CM | POA: Diagnosis not present

## 2022-10-15 DIAGNOSIS — I480 Paroxysmal atrial fibrillation: Secondary | ICD-10-CM | POA: Diagnosis not present

## 2022-10-15 DIAGNOSIS — E785 Hyperlipidemia, unspecified: Secondary | ICD-10-CM | POA: Diagnosis not present

## 2022-10-16 DIAGNOSIS — I480 Paroxysmal atrial fibrillation: Secondary | ICD-10-CM | POA: Diagnosis not present

## 2022-10-16 DIAGNOSIS — B003 Herpesviral meningitis: Secondary | ICD-10-CM | POA: Diagnosis not present

## 2022-10-16 DIAGNOSIS — A812 Progressive multifocal leukoencephalopathy: Secondary | ICD-10-CM | POA: Diagnosis not present

## 2022-10-16 DIAGNOSIS — J45909 Unspecified asthma, uncomplicated: Secondary | ICD-10-CM | POA: Diagnosis not present

## 2022-10-16 DIAGNOSIS — E118 Type 2 diabetes mellitus with unspecified complications: Secondary | ICD-10-CM | POA: Diagnosis not present

## 2022-10-16 DIAGNOSIS — F02B3 Dementia in other diseases classified elsewhere, moderate, with mood disturbance: Secondary | ICD-10-CM | POA: Diagnosis not present

## 2022-10-16 DIAGNOSIS — K219 Gastro-esophageal reflux disease without esophagitis: Secondary | ICD-10-CM | POA: Diagnosis not present

## 2022-10-16 DIAGNOSIS — K111 Hypertrophy of salivary gland: Secondary | ICD-10-CM | POA: Diagnosis not present

## 2022-10-16 DIAGNOSIS — G9341 Metabolic encephalopathy: Secondary | ICD-10-CM | POA: Diagnosis not present

## 2022-10-16 DIAGNOSIS — A6000 Herpesviral infection of urogenital system, unspecified: Secondary | ICD-10-CM | POA: Diagnosis not present

## 2022-10-16 DIAGNOSIS — I2699 Other pulmonary embolism without acute cor pulmonale: Secondary | ICD-10-CM | POA: Diagnosis not present

## 2022-10-16 DIAGNOSIS — E0501 Thyrotoxicosis with diffuse goiter with thyrotoxic crisis or storm: Secondary | ICD-10-CM | POA: Diagnosis not present

## 2022-10-16 DIAGNOSIS — E785 Hyperlipidemia, unspecified: Secondary | ICD-10-CM | POA: Diagnosis not present

## 2022-10-16 DIAGNOSIS — B20 Human immunodeficiency virus [HIV] disease: Secondary | ICD-10-CM | POA: Diagnosis not present

## 2022-10-16 DIAGNOSIS — G8114 Spastic hemiplegia affecting left nondominant side: Secondary | ICD-10-CM | POA: Diagnosis not present

## 2022-10-16 DIAGNOSIS — E0500 Thyrotoxicosis with diffuse goiter without thyrotoxic crisis or storm: Secondary | ICD-10-CM | POA: Diagnosis not present

## 2022-10-17 DIAGNOSIS — K111 Hypertrophy of salivary gland: Secondary | ICD-10-CM | POA: Diagnosis not present

## 2022-10-17 DIAGNOSIS — G9341 Metabolic encephalopathy: Secondary | ICD-10-CM | POA: Diagnosis not present

## 2022-10-17 DIAGNOSIS — J45909 Unspecified asthma, uncomplicated: Secondary | ICD-10-CM | POA: Diagnosis not present

## 2022-10-17 DIAGNOSIS — E785 Hyperlipidemia, unspecified: Secondary | ICD-10-CM | POA: Diagnosis not present

## 2022-10-17 DIAGNOSIS — E0501 Thyrotoxicosis with diffuse goiter with thyrotoxic crisis or storm: Secondary | ICD-10-CM | POA: Diagnosis not present

## 2022-10-17 DIAGNOSIS — G8114 Spastic hemiplegia affecting left nondominant side: Secondary | ICD-10-CM | POA: Diagnosis not present

## 2022-10-17 DIAGNOSIS — I480 Paroxysmal atrial fibrillation: Secondary | ICD-10-CM | POA: Diagnosis not present

## 2022-10-17 DIAGNOSIS — A812 Progressive multifocal leukoencephalopathy: Secondary | ICD-10-CM | POA: Diagnosis not present

## 2022-10-17 DIAGNOSIS — E118 Type 2 diabetes mellitus with unspecified complications: Secondary | ICD-10-CM | POA: Diagnosis not present

## 2022-10-17 DIAGNOSIS — K219 Gastro-esophageal reflux disease without esophagitis: Secondary | ICD-10-CM | POA: Diagnosis not present

## 2022-10-17 DIAGNOSIS — E0500 Thyrotoxicosis with diffuse goiter without thyrotoxic crisis or storm: Secondary | ICD-10-CM | POA: Diagnosis not present

## 2022-10-17 DIAGNOSIS — I2699 Other pulmonary embolism without acute cor pulmonale: Secondary | ICD-10-CM | POA: Diagnosis not present

## 2022-10-17 DIAGNOSIS — A6000 Herpesviral infection of urogenital system, unspecified: Secondary | ICD-10-CM | POA: Diagnosis not present

## 2022-10-17 DIAGNOSIS — F02B3 Dementia in other diseases classified elsewhere, moderate, with mood disturbance: Secondary | ICD-10-CM | POA: Diagnosis not present

## 2022-10-17 DIAGNOSIS — B20 Human immunodeficiency virus [HIV] disease: Secondary | ICD-10-CM | POA: Diagnosis not present

## 2022-10-17 DIAGNOSIS — B003 Herpesviral meningitis: Secondary | ICD-10-CM | POA: Diagnosis not present

## 2022-11-07 DIAGNOSIS — Z419 Encounter for procedure for purposes other than remedying health state, unspecified: Secondary | ICD-10-CM | POA: Diagnosis not present

## 2022-11-07 DEATH — deceased
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
## 2023-06-19 NOTE — Progress Notes (Signed)
 The 10-year ASCVD risk score (Arnett DK, et al., 2019) is: 16.1%   Values used to calculate the score:     Age: 63 years     Sex: Male     Is Non-Hispanic African American: Yes     Diabetic: Yes     Tobacco smoker: No     Systolic Blood Pressure: 125 mmHg     Is BP treated: No     HDL Cholesterol: 39 mg/dL     Total Cholesterol: 197 mg/dL  Arlon Bergamo, BSN, RN
